# Patient Record
Sex: Female | Born: 1935 | Race: White | Hispanic: No | Smoking: Former smoker
Health system: Southern US, Community
[De-identification: ages and names within clinical notes are randomized; demographics above are authoritative.]

## PROBLEM LIST (undated history)

## (undated) DIAGNOSIS — I209 Angina pectoris, unspecified: Secondary | ICD-10-CM

## (undated) DIAGNOSIS — Z9289 Personal history of other medical treatment: Secondary | ICD-10-CM

## (undated) DIAGNOSIS — K219 Gastro-esophageal reflux disease without esophagitis: Secondary | ICD-10-CM

## (undated) DIAGNOSIS — R0789 Other chest pain: Secondary | ICD-10-CM

## (undated) DIAGNOSIS — J309 Allergic rhinitis, unspecified: Secondary | ICD-10-CM

## (undated) DIAGNOSIS — G4700 Insomnia, unspecified: Secondary | ICD-10-CM

## (undated) DIAGNOSIS — J45909 Unspecified asthma, uncomplicated: Secondary | ICD-10-CM

## (undated) DIAGNOSIS — IMO0001 Reserved for inherently not codable concepts without codable children: Secondary | ICD-10-CM

## (undated) DIAGNOSIS — R918 Other nonspecific abnormal finding of lung field: Secondary | ICD-10-CM

## (undated) DIAGNOSIS — I4891 Unspecified atrial fibrillation: Secondary | ICD-10-CM

## (undated) DIAGNOSIS — G4730 Sleep apnea, unspecified: Secondary | ICD-10-CM

## (undated) DIAGNOSIS — I10 Essential (primary) hypertension: Secondary | ICD-10-CM

## (undated) DIAGNOSIS — C189 Malignant neoplasm of colon, unspecified: Secondary | ICD-10-CM

## (undated) DIAGNOSIS — J9610 Chronic respiratory failure, unspecified whether with hypoxia or hypercapnia: Secondary | ICD-10-CM

## (undated) DIAGNOSIS — I480 Paroxysmal atrial fibrillation: Secondary | ICD-10-CM

## (undated) DIAGNOSIS — I2510 Atherosclerotic heart disease of native coronary artery without angina pectoris: Secondary | ICD-10-CM

## (undated) DIAGNOSIS — N189 Chronic kidney disease, unspecified: Secondary | ICD-10-CM

## (undated) DIAGNOSIS — N1832 Chronic kidney disease, stage 3b: Secondary | ICD-10-CM

## (undated) DIAGNOSIS — M069 Rheumatoid arthritis, unspecified: Secondary | ICD-10-CM

## (undated) DIAGNOSIS — R0602 Shortness of breath: Secondary | ICD-10-CM

## (undated) HISTORY — DX: Gastro-esophageal reflux disease without esophagitis: K21.9

## (undated) HISTORY — PX: BACK SURGERY: SHX140

## (undated) HISTORY — PX: COLON SURGERY: SHX602

## (undated) HISTORY — DX: Shortness of breath: R06.02

## (undated) HISTORY — DX: Reserved for inherently not codable concepts without codable children: IMO0001

## (undated) HISTORY — PX: ANKLE FRACTURE SURGERY: SHX122

## (undated) HISTORY — DX: Unspecified atrial fibrillation: I48.91

## (undated) HISTORY — PX: BREAST SURGERY: SHX581

## (undated) HISTORY — DX: Morbid (severe) obesity due to excess calories: E66.01

## (undated) HISTORY — DX: Chronic kidney disease, unspecified: N18.9

## (undated) HISTORY — DX: Personal history of other medical treatment: Z92.89

## (undated) HISTORY — PX: ABDOMINAL HYSTERECTOMY: SHX81

## (undated) HISTORY — DX: Other chest pain: R07.89

## (undated) HISTORY — DX: Malignant neoplasm of colon, unspecified: C18.9

## (undated) HISTORY — DX: Sleep apnea, unspecified: G47.30

## (undated) HISTORY — DX: Insomnia, unspecified: G47.00

## (undated) HISTORY — PX: CORONARY STENT PLACEMENT: SHX1402

## (undated) HISTORY — DX: Allergic rhinitis, unspecified: J30.9

---

## 2000-06-16 ENCOUNTER — Encounter: Payer: Self-pay | Admitting: Neurosurgery

## 2000-06-18 ENCOUNTER — Encounter: Payer: Self-pay | Admitting: Neurosurgery

## 2000-06-18 ENCOUNTER — Observation Stay (HOSPITAL_COMMUNITY): Admission: RE | Admit: 2000-06-18 | Discharge: 2000-06-19 | Payer: Self-pay | Admitting: Neurosurgery

## 2000-08-30 ENCOUNTER — Ambulatory Visit (HOSPITAL_COMMUNITY): Admission: RE | Admit: 2000-08-30 | Discharge: 2000-08-30 | Payer: Self-pay | Admitting: Neurosurgery

## 2000-08-30 ENCOUNTER — Encounter: Payer: Self-pay | Admitting: Neurosurgery

## 2000-11-07 ENCOUNTER — Encounter: Admission: RE | Admit: 2000-11-07 | Discharge: 2000-12-23 | Payer: Self-pay | Admitting: Family Medicine

## 2001-02-05 ENCOUNTER — Other Ambulatory Visit: Admission: RE | Admit: 2001-02-05 | Discharge: 2001-02-05 | Payer: Self-pay | Admitting: Gynecology

## 2001-02-24 ENCOUNTER — Encounter: Admission: RE | Admit: 2001-02-24 | Discharge: 2001-02-24 | Payer: Self-pay | Admitting: Family Medicine

## 2001-02-24 ENCOUNTER — Encounter: Payer: Self-pay | Admitting: Family Medicine

## 2001-03-27 ENCOUNTER — Encounter: Payer: Self-pay | Admitting: Neurosurgery

## 2001-03-27 ENCOUNTER — Encounter: Admission: RE | Admit: 2001-03-27 | Discharge: 2001-03-27 | Payer: Self-pay | Admitting: Neurosurgery

## 2001-04-10 ENCOUNTER — Encounter: Admission: RE | Admit: 2001-04-10 | Discharge: 2001-04-10 | Payer: Self-pay | Admitting: Neurosurgery

## 2001-04-10 ENCOUNTER — Encounter: Payer: Self-pay | Admitting: Neurosurgery

## 2001-06-15 ENCOUNTER — Encounter: Payer: Self-pay | Admitting: Family Medicine

## 2001-06-15 ENCOUNTER — Encounter: Admission: RE | Admit: 2001-06-15 | Discharge: 2001-06-15 | Payer: Self-pay | Admitting: Family Medicine

## 2001-08-26 ENCOUNTER — Encounter: Payer: Self-pay | Admitting: Neurosurgery

## 2001-08-26 ENCOUNTER — Encounter: Admission: RE | Admit: 2001-08-26 | Discharge: 2001-08-26 | Payer: Self-pay | Admitting: Neurosurgery

## 2001-10-13 ENCOUNTER — Encounter: Payer: Self-pay | Admitting: Gynecology

## 2001-10-13 ENCOUNTER — Ambulatory Visit (HOSPITAL_COMMUNITY): Admission: RE | Admit: 2001-10-13 | Discharge: 2001-10-13 | Payer: Self-pay | Admitting: Gynecology

## 2001-11-13 ENCOUNTER — Encounter: Payer: Self-pay | Admitting: Gynecology

## 2001-11-16 ENCOUNTER — Observation Stay (HOSPITAL_COMMUNITY): Admission: RE | Admit: 2001-11-16 | Discharge: 2001-11-17 | Payer: Self-pay | Admitting: Gynecology

## 2001-11-16 ENCOUNTER — Encounter (INDEPENDENT_AMBULATORY_CARE_PROVIDER_SITE_OTHER): Payer: Self-pay

## 2002-04-19 ENCOUNTER — Encounter: Admission: RE | Admit: 2002-04-19 | Discharge: 2002-04-19 | Payer: Self-pay | Admitting: Urology

## 2002-04-19 ENCOUNTER — Encounter: Payer: Self-pay | Admitting: Urology

## 2002-10-06 ENCOUNTER — Encounter: Payer: Self-pay | Admitting: Family Medicine

## 2002-10-06 ENCOUNTER — Encounter: Admission: RE | Admit: 2002-10-06 | Discharge: 2002-10-06 | Payer: Self-pay | Admitting: Family Medicine

## 2005-01-28 ENCOUNTER — Encounter: Admission: RE | Admit: 2005-01-28 | Discharge: 2005-01-28 | Payer: Self-pay | Admitting: Family Medicine

## 2006-01-14 ENCOUNTER — Other Ambulatory Visit: Admission: RE | Admit: 2006-01-14 | Discharge: 2006-01-14 | Payer: Self-pay | Admitting: Obstetrics and Gynecology

## 2006-01-27 ENCOUNTER — Encounter: Admission: RE | Admit: 2006-01-27 | Discharge: 2006-01-27 | Payer: Self-pay | Admitting: Obstetrics and Gynecology

## 2006-04-14 ENCOUNTER — Encounter: Admission: RE | Admit: 2006-04-14 | Discharge: 2006-04-14 | Payer: Self-pay | Admitting: Surgery

## 2006-04-16 ENCOUNTER — Encounter (INDEPENDENT_AMBULATORY_CARE_PROVIDER_SITE_OTHER): Payer: Self-pay | Admitting: Specialist

## 2006-04-16 ENCOUNTER — Ambulatory Visit (HOSPITAL_BASED_OUTPATIENT_CLINIC_OR_DEPARTMENT_OTHER): Admission: RE | Admit: 2006-04-16 | Discharge: 2006-04-16 | Payer: Self-pay | Admitting: Surgery

## 2006-08-19 ENCOUNTER — Encounter (INDEPENDENT_AMBULATORY_CARE_PROVIDER_SITE_OTHER): Payer: Self-pay | Admitting: *Deleted

## 2006-08-19 ENCOUNTER — Ambulatory Visit (HOSPITAL_COMMUNITY): Admission: RE | Admit: 2006-08-19 | Discharge: 2006-08-19 | Payer: Self-pay | Admitting: Surgery

## 2006-12-19 ENCOUNTER — Ambulatory Visit (HOSPITAL_COMMUNITY): Admission: RE | Admit: 2006-12-19 | Discharge: 2006-12-19 | Payer: Self-pay | Admitting: Surgery

## 2006-12-19 ENCOUNTER — Encounter (INDEPENDENT_AMBULATORY_CARE_PROVIDER_SITE_OTHER): Payer: Self-pay | Admitting: Specialist

## 2007-02-17 ENCOUNTER — Encounter: Admission: RE | Admit: 2007-02-17 | Discharge: 2007-02-17 | Payer: Self-pay | Admitting: Gynecology

## 2007-02-24 ENCOUNTER — Encounter: Admission: RE | Admit: 2007-02-24 | Discharge: 2007-02-24 | Payer: Self-pay | Admitting: Gynecology

## 2007-09-17 ENCOUNTER — Encounter: Admission: RE | Admit: 2007-09-17 | Discharge: 2007-09-17 | Payer: Self-pay | Admitting: Family Medicine

## 2008-06-28 ENCOUNTER — Encounter: Admission: RE | Admit: 2008-06-28 | Discharge: 2008-06-28 | Payer: Self-pay | Admitting: Surgery

## 2009-03-17 ENCOUNTER — Encounter: Admission: RE | Admit: 2009-03-17 | Discharge: 2009-03-17 | Payer: Self-pay | Admitting: Family Medicine

## 2009-06-29 ENCOUNTER — Encounter: Admission: RE | Admit: 2009-06-29 | Discharge: 2009-06-29 | Payer: Self-pay | Admitting: Family Medicine

## 2010-02-26 ENCOUNTER — Encounter: Payer: Self-pay | Admitting: Emergency Medicine

## 2010-03-06 ENCOUNTER — Encounter: Payer: Self-pay | Admitting: Emergency Medicine

## 2010-03-06 ENCOUNTER — Ambulatory Visit (HOSPITAL_COMMUNITY): Admission: RE | Admit: 2010-03-06 | Discharge: 2010-03-06 | Payer: Self-pay | Admitting: Family Medicine

## 2010-03-16 ENCOUNTER — Encounter: Payer: Self-pay | Admitting: Emergency Medicine

## 2010-04-18 DIAGNOSIS — I10 Essential (primary) hypertension: Secondary | ICD-10-CM | POA: Insufficient documentation

## 2010-04-18 DIAGNOSIS — C189 Malignant neoplasm of colon, unspecified: Secondary | ICD-10-CM | POA: Insufficient documentation

## 2010-04-18 DIAGNOSIS — G4700 Insomnia, unspecified: Secondary | ICD-10-CM | POA: Insufficient documentation

## 2010-04-18 DIAGNOSIS — K219 Gastro-esophageal reflux disease without esophagitis: Secondary | ICD-10-CM | POA: Insufficient documentation

## 2010-04-18 DIAGNOSIS — M797 Fibromyalgia: Secondary | ICD-10-CM | POA: Insufficient documentation

## 2010-04-19 ENCOUNTER — Ambulatory Visit: Payer: Self-pay | Admitting: Emergency Medicine

## 2010-04-19 DIAGNOSIS — J309 Allergic rhinitis, unspecified: Secondary | ICD-10-CM | POA: Insufficient documentation

## 2010-04-19 DIAGNOSIS — I4891 Unspecified atrial fibrillation: Secondary | ICD-10-CM | POA: Insufficient documentation

## 2010-04-19 DIAGNOSIS — R0789 Other chest pain: Secondary | ICD-10-CM | POA: Insufficient documentation

## 2010-04-19 DIAGNOSIS — R0602 Shortness of breath: Secondary | ICD-10-CM | POA: Insufficient documentation

## 2010-04-26 ENCOUNTER — Telehealth: Payer: Self-pay | Admitting: Emergency Medicine

## 2010-05-10 ENCOUNTER — Ambulatory Visit: Payer: Self-pay | Admitting: Emergency Medicine

## 2010-05-22 ENCOUNTER — Telehealth (INDEPENDENT_AMBULATORY_CARE_PROVIDER_SITE_OTHER): Payer: Self-pay | Admitting: *Deleted

## 2010-07-24 ENCOUNTER — Encounter: Admission: RE | Admit: 2010-07-24 | Discharge: 2010-07-24 | Payer: Self-pay | Admitting: Family Medicine

## 2010-11-29 NOTE — Letter (Signed)
Summary: Lupe Carney MD/[facility] MD/[facility]   Imported By: Lester [place] 06/08/2010 09:05:01  _____________________________________________________________________  External Attachment:    Type:   Image     Comment:   External Document

## 2010-11-29 NOTE — Progress Notes (Signed)
Summary: rx request/ cough-pt called again  Phone Note Call from Patient Call back at Home Phone [phone]   Caller: Patient Call For: byrum Summary of Call: pt wants a rx to "knock out her cough". non-productive. pt was recently seen and says that the ultram hasn't helped with this. [facility] [address] Initial call taken by: Tivis Ringer, CNA,  May 22, 2010 10:31 AM  Follow-up for Phone Call        called and spoke with pt---she stated that she has been like this since april--dry cough most of the time sometimes with white sputum---she stated that the ultram has not helped very much---please advise.  thanks Randell Loop CMA  May 22, 2010 11:24 AM   Additional Follow-up for Phone Call Additional follow up Details #1::        pt called yesterday. still waiting to hear back. (dr byrum was out of office and is out all week) please call her asap. she is requesting a zpac or "something". her sides are very sore from coughing now. Tivis Ringer, CNA  May 23, 2010 3:38 PM    Additional Follow-up for Phone Call Additional follow up Details #2::    she has a chronic cough that has been ongoing.  She needs ov for evaluation Follow-up by: Barbaraann Share MD,  May 23, 2010 5:28 PM  Additional Follow-up for Phone Call Additional follow up Details #3:: Details for Additional Follow-up Action Taken: Spoke with pt.  and advised needs ov.  She refused this, stating that she was "just here"- I advised that we can not tx her cough over the phone and should come in if this is bothering her.  She again refused and states that she will call her family doctor and see if they will call her in abx.  Additional Follow-up by: Vernie Murders,  May 23, 2010 5:32 PM

## 2010-11-29 NOTE — Assessment & Plan Note (Signed)
Summary: cough, dyspnea   Visit Type:  Initial Consult Copy to:  Dr. Clovis Riley Primary Provider/Referring Provider:  Dr. Lupe Carney  CC:  Pulmonary consult.The patient c/o increased sob with exertion and says she cannot take a deep breath..  History of Present Illness: 77 yo former smoker, hx of HTN, fibromyalgia, GERD, arthritis, rectal CA '07. Also with some allergic rhinitis. Referred for evaluation of fatigue and shortness.   Summer 2010 she began to notice some exertional dyspnea, overall fatigue. Associated with sneezing, runny nose, cough. Cough is non-prod, has a globus sensation with throat clearing. She has about 12 lbs wt gain in last yr. Complains of some mid-sternal chest pain, ? GERD. Was changed from ACE-I to ARB yrs ago due to cough. Currently on Advair, no real response. Not taking fluticisone NS regularly, not on PPI. She tells me that she has had a cardiac stress test that was reassuring (not available to me yet)  Preventive Screening-Counseling & Management  Alcohol-Tobacco     Alcohol drinks/day: 0     Smoking Status: quit     Smoking Cessation Counseling: yes     Smoke Cessation Stage: quit     Year Started: 1949     Year Quit: 1964     Pack years: 1 ppd x13 years     Tobacco Counseling: to remain off tobacco products  Current Medications (verified): 1)  Diovan Hct 160-25 Mg Tabs (Valsartan-Hydrochlorothiazide) .Marland Kitchen.. 1 By Mouth Daily 2)  Fluticasone Propionate 50 Mcg/act Susp (Fluticasone Propionate) .Marland Kitchen.. 1 Spray in Each Nostril Daily 3)  Proventil Hfa 108 (90 Base) Mcg/act Aers (Albuterol Sulfate) .... 2 Puffs Every 4 Hours As Needed For Sob and Wheezing 4)  Lunesta 3 Mg Tabs (Eszopiclone) .Marland Kitchen.. 1 By Mouth At Bedtime  As Needed 5)  Vitamin C 500 Mg Tabs (Ascorbic Acid) .Marland Kitchen.. 1 By Mouth Daily 6)  Calcium 500 +d 500-400 Mg-Unit Tabs (Calcium-Vitamin D) .Marland Kitchen.. 1 By Mouth Daily 7)  B Complex  Tabs (B Complex Vitamins) .Marland Kitchen.. 1 By Mouth Daily 8)  Zinc 50 Mg Tabs (Zinc)  .Marland Kitchen.. 1 By Mouth Daily As Needed 9)  Advair Diskus 250-50 Mcg/dose Aepb (Fluticasone-Salmeterol) .Marland Kitchen.. 1 Puff Two Times A Day  Allergies: 1)  ! * Asa 2)  ! * Ace Inhibitors  Past History:  Past Surgical History: Last updated: 04/18/2010 Hemorrhoidectomy TVH w/BSO in 2003 Rectal cancer in 2007 Removal of rectal scar in 2008  Family History: Last updated: 04/19/2010 Heart disease---mother and 2 brothers Family History Lung Cancer---sister, deceased  Social History: Last updated: 04/19/2010 Widowed Patient states former smoker.  Retired Neurosurgeon  Past Medical History: Current Problems:  Hx of ATRIAL FIBRILLATION (ICD-427.31) INSOMNIA (ICD-780.52) FIBROMYALGIA (ICD-729.1) CANCER, COLORECTAL (ICD-153.9) HYPERTENSION (ICD-401.9) G E R D (ICD-530.81)  Family History: Reviewed history and no changes required. Heart disease---mother and 2 brothers Family History Lung Cancer---sister, deceased  Social History: Reviewed history and no changes required. Widowed Patient states former smoker.  Retired Financial planner Status:  quit Pack years:  1 ppd x13 years Alcohol drinks/day:  0  Review of Systems       The patient complains of shortness of breath with activity, shortness of breath at rest, productive cough, chest pain, irregular heartbeats, acid heartburn, indigestion, weight change, abdominal pain, headaches, sneezing, itching, depression, hand/feet swelling, and joint stiffness or pain.  The patient denies non-productive cough, coughing up blood, loss of appetite, difficulty swallowing, sore throat, tooth/dental problems, nasal congestion/difficulty breathing through nose, ear ache, anxiety, rash, change in  color of mucus, and fever.    Vital Signs:  Patient profile:   77 year old female Height:      64 inches (162.56 cm) Weight:      195 pounds (88.64 kg) BMI:     33.59 O2 Sat:      96 % on Room air Temp:     97.8 degrees F (36.56 degrees C) oral Pulse rate:    67 / minute BP sitting:   112 / 70  (right arm) Cuff size:   regular  Vitals Entered By: Michel Bickers CMA (April 19, 2010 2:02 PM)  O2 Sat at Rest %:  96 O2 Flow:  Room air CC: Pulmonary consult.The patient c/o increased sob with exertion and says she cannot take a deep breath. Comments Ambulatory Pulse Oximetry  Resting; HR_83____    02 Sat__945RA___  Lap1 (185 feet)   HR_87____   02 Sat_96%RA____ Lap2 (185 feet)   HR_87____   02 Sat__96%RA___    Lap3 (185 feet)   HR__82___   02 Sat__97%RA___  _x__Test Completed without Difficulty ___Test Stopped due to:     Physical Exam  General:  normal appearance, healthy appearing, and obese.   Head:  normocephalic and atraumatic Eyes:   conjunctiva and sclera clear Nose:  no deformity, discharge, inflammation, or lesions Mouth:  no deformity or lesions Neck:  no masses, thyromegaly, or abnormal cervical nodes Lungs:  clear bilaterally to auscultation and percussion Heart:  regular rate and rhythm, S1, S2 without murmurs, rubs, gallops, or clicks Abdomen:  not examined Msk:  no deformity or scoliosis noted with normal posture Extremities:  no clubbing, cyanosis, edema, or deformity noted Neurologic:  non-focal Skin:  intact without lesions or rashes Psych:  alert and cooperative; normal mood and affect; normal attention span and concentration   Impression & Recommendations:  Problem # 1:  DYSPNEA (ICD-786.05)  Suspect multifactorial - wt gain + some underlying mild airflow limitation that has been exacerbated by worsening allergies.  - trial changing advair to spiriva - SABA as needed - treat allergies - rov to discuss 2 -3 weeks  - walking oximetry today  Orders: Consultation Level IV (04540)  Problem # 2:  CHEST PAIN, ATYPICAL (ICD-786.59) Sounds like GERD, she reportedly has a reassuring stress test at [facility].  - will obtain stress test results - empiric omeprazole trial until next visit  Medications Added to  Medication List This Visit: 1)  Zinc 50 Mg Tabs (Zinc) .Marland Kitchen.. 1 by mouth daily as needed 2)  Advair Diskus 250-50 Mcg/dose Aepb (Fluticasone-salmeterol) .Marland Kitchen.. 1 puff two times a day 3)  Omeprazole 20 Mg Cpdr (Omeprazole) .Marland Kitchen.. 1 by mouth once daily 4)  Spiriva Handihaler 18 Mcg Caps (Tiotropium bromide monohydrate) .Marland Kitchen.. 1 inhalation once daily 5)  Loratadine 10 Mg Tabs (Loratadine) .Marland Kitchen.. 1 by mouth once daily  Patient Instructions: 1)  Stop your Advair 2)  Start Spiriva 1 inhalation once daily  3)  Use proventil as needed for shortness of breath 4)  Start loratadine 10mg  by mouth once daily  5)  Start taking your fluticasone nasal spray, 2 sprays each nostril two times a day  6)  Start omeprazole 20mg  by mouth once daily until your next visit 7)  Follow up with Dr Delton Coombes in 2 -3 weeks to discuss your progress.  Prescriptions: LORATADINE 10 MG TABS (LORATADINE) 1 by mouth once daily  #30 x 3   Entered and Authorized by:   Leslye Peer MD   Signed  by:   Leslye Peer MD on 04/19/2010   Method used:   Electronically to        [facility]. [address] [phone]* (retail)       [address]       [place], [place]  [address]       Ph: [phone] or [phone]       Fax: [phone]   RxID:   [id number] SPIRIVA HANDIHALER 18 MCG CAPS (TIOTROPIUM BROMIDE MONOHYDRATE) 1 inhalation once daily  #30 x 3   Entered and Authorized by:   Leslye Peer MD   Signed by:   Leslye Peer MD on 04/19/2010   Method used:   Electronically to        [facility] [address] [phone]* (retail)       [address]       [place], [place]  [address]       Ph: [phone] or [phone]       Fax: [phone]   RxID:   [id number] OMEPRAZOLE 20 MG CPDR (OMEPRAZOLE) 1 by mouth once daily  #30 x 3   Entered and Authorized by:   Leslye Peer MD   Signed by:   Leslye Peer MD on 04/19/2010   Method used:   Electronically to        [facility] [address] [phone]*  (retail)       [address]       [place], [place]  [address]       Ph: [phone] or [phone]       Fax: [phone]   RxID:   [id number]

## 2010-11-29 NOTE — Assessment & Plan Note (Signed)
Summary: cough, GERD, allergies   Visit Type:  Follow-up Copy to:  Dr. Clovis Riley Primary Provider/Referring Provider:  Dr. Lupe Carney  CC:  patient is here for follow-up. The patient says her breathing has improved. she does c/o a dry cough. Worse at night..  History of Present Illness: 77 yo former smoker, hx of HTN, fibromyalgia, GERD, arthritis, rectal CA '07. Also with some allergic rhinitis. Referred for evaluation of fatigue and shortness.   Summer 2010 she began to notice some exertional dyspnea, overall fatigue. Associated with sneezing, runny nose, cough. Cough is non-prod, has a globus sensation with throat clearing. She has about 12 lbs wt gain in last yr. Complains of some mid-sternal chest pain, ? GERD. Was changed from ACE-I to ARB yrs ago due to cough. Currently on Advair, no real response. Not taking fluticisone NS regularly, not on PPI. She tells me that she has had a cardiac stress test that was reassuring (not available to me yet)  ROV 05/10/10 -- returns for f/u allergies, cough, COPD. Was treated with loratadine, nasal steroid. Never started the nose spray. Also treated GERD with omeprazole. Called here with persistant cough 2 weeks ago and received ultram that seemed to help. Cough was better for a while, then she had another episode prompted her to get azithromycin from PCP. We also changed Advair to Spiriva, which she believes has helped her breathing significantly.   Current Medications (verified): 1)  Diovan Hct 160-25 Mg Tabs (Valsartan-Hydrochlorothiazide) .Marland Kitchen.. 1 By Mouth Daily 2)  Fluticasone Propionate 50 Mcg/act Susp (Fluticasone Propionate) .Marland Kitchen.. 1 Spray in Each Nostril Daily 3)  Proventil Hfa 108 (90 Base) Mcg/act Aers (Albuterol Sulfate) .... 2 Puffs Every 4 Hours As Needed For Sob and Wheezing 4)  Lunesta 3 Mg Tabs (Eszopiclone) .Marland Kitchen.. 1 By Mouth At Bedtime  As Needed 5)  Vitamin C 500 Mg Tabs (Ascorbic Acid) .Marland Kitchen.. 1 By Mouth Daily 6)  Calcium 500 +d 500-400  Mg-Unit Tabs (Calcium-Vitamin D) .Marland Kitchen.. 1 By Mouth Daily 7)  B Complex  Tabs (B Complex Vitamins) .Marland Kitchen.. 1 By Mouth Daily 8)  Zinc 50 Mg Tabs (Zinc) .Marland Kitchen.. 1 By Mouth Daily As Needed 9)  Omeprazole 20 Mg Cpdr (Omeprazole) .Marland Kitchen.. 1 By Mouth Once Daily 10)  Spiriva Handihaler 18 Mcg Caps (Tiotropium Bromide Monohydrate) .Marland Kitchen.. 1 Inhalation Once Daily 11)  Loratadine 10 Mg Tabs (Loratadine) .Marland Kitchen.. 1 By Mouth Once Daily 12)  Tramadol Hcl 50 Mg Tabs (Tramadol Hcl) .Marland Kitchen.. 1 By Mouth Two Times A Day As Needed For Cough  Allergies (verified): 1)  ! * Asa 2)  ! * Ace Inhibitors  Vital Signs:  Patient profile:   77 year old female Height:      64 inches (162.56 cm) Weight:      206 pounds (93.64 kg) BMI:     35.49 O2 Sat:      97 % on Room air Temp:     98.0 degrees F (36.67 degrees C) oral Pulse rate:   71 / minute BP sitting:   142 / 60  (left arm) Cuff size:   large  Vitals Entered By: Michel Bickers CMA (May 10, 2010 3:17 PM)  O2 Sat at Rest %:  97 O2 Flow:  Room air CC: patient is here for follow-up. The patient says her breathing has improved. she does c/o a dry cough. Worse at night. Comments Medications reivewed. Daytime phone verified. Michel Bickers [facility]  May 10, 2010 3:22 PM   Physical Exam  General:  normal appearance, healthy appearing, and obese.   Head:  normocephalic and atraumatic Eyes:   conjunctiva and sclera clear Nose:  no deformity, discharge, inflammation, or lesions Mouth:  no deformity or lesions Neck:  no masses, thyromegaly, or abnormal cervical nodes Lungs:  clear bilaterally to auscultation and percussion Heart:  regular rate and rhythm, S1, S2 without murmurs, rubs, gallops, or clicks Abdomen:  not examined Msk:  no deformity or scoliosis noted with normal posture Extremities:  no clubbing, cyanosis, edema, or deformity noted Neurologic:  non-focal Skin:  intact without lesions or rashes Psych:  alert and cooperative; normal mood and affect; normal attention span and  concentration   Impression & Recommendations:  Problem # 1:  DYSPNEA (ICD-786.05)  Spiriva once daily   Orders: Est. Patient Level IV (27253) Continue your omeprazole 20mg  once daily  Continue your loratadine once daily  Start nasal saline washes once daily  Use ultram as needed for cough Follow up with Dr Delton Coombes in 2 months or as needed.   Patient Instructions: 1)  Continue your omeprazole 20mg  once daily  2)  Continue your loratadine once daily  3)  Start nasal saline washes once daily  4)  Continue your Spiriva 1 inhalation once daily  5)  Use ultram as needed for cough 6)  Follow up with Dr Delton Coombes in 2 months or as needed.

## 2010-11-29 NOTE — Progress Notes (Signed)
Summary: Cough  Phone Note Call from Patient Call back at Home Phone [phone]   Caller: Patient Call For: byrum Reason for Call: Talk to Nurse Summary of Call: pt has a bad cough during the night, can she get some cough meds.  She doesn't want to suffer over w/e.  Getting some white plegm up too. [facility] Initial call taken by: Eugene Gavia,  April 26, 2010 9:17 AM  Follow-up for Phone Call        The patient c/o a dry hacky cough with little mucus prod. Mucus is white in color. She is taking Spiriva, Loratadine, fluticasone and omeptazole as RX'd by Dr. Delton Coombes at last OV. She says she has coughed all night. Will send Msg to doc of the day for any recs. Please advise.:  1)  ! * Asa 2)  ! * Ace Inhibitors Follow-up by: Michel Bickers CMA,  April 26, 2010 9:36 AM  Additional Follow-up for Phone Call Additional follow up Details #1::        Per CDY,  call Tramadol 50mg , # 20, 1 by mouth two times a day as needed for cough and try Delsym OTC. LMOM with details for patient. Will forward to RB so he is aware. Additional Follow-up by: Michel Bickers CMA,  April 26, 2010 1:49 PM    New/Updated Medications: TRAMADOL HCL 50 MG TABS (TRAMADOL HCL) 1 by mouth two times a day as needed for cough Prescriptions: TRAMADOL HCL 50 MG TABS (TRAMADOL HCL) 1 by mouth two times a day as needed for cough  #20 x 0   Entered by:   Michel Bickers CMA   Authorized by:   Waymon Budge MD   Signed by:   Michel Bickers CMA on 04/26/2010   Method used:   Electronically to        [facility] [address] [phone]* (retail)       [address]       [place], [place]  [address]       Ph: [phone] or [phone]       Fax: [phone]   RxID:   [id number]

## 2011-03-15 NOTE — Op Note (Signed)
NAME:  Sheryl Curtis                 ACCOUNT NO.:  [id number]   MEDICAL RECORD NO.:  [id number]          PATIENT TYPE:  AMB   LOCATION:  DAY                          FACILITY:  [facility]   PHYSICIAN:  Sandria Bales. Ezzard Standing, M.D.  DATE OF BIRTH:  November 11, 1931   DATE OF PROCEDURE:  12/19/2006  DATE OF DISCHARGE:                               OPERATIVE REPORT   PREOPERATIVE DIAGNOSIS:  History of T1 rectal carcinoma.   POSTOPERATIVE DIAGNOSIS:  Residual scar at the 6-cm area of the anal  verge posteriorly with no evidence of recurrent tumor.   PROCEDURES:  1. Flexible sigmoidoscopy to 60 cm.  2. Rectal ultrasound.  3. Transanal excision of rectal scar.   SURGEON:  Ovidio Kin, MD   FIRST ASSISTANT:  None.   ANESTHESIA:  General endotracheal.   ESTIMATED BLOOD LOSS:  25 mL.   DRAINS:  Drains left in were none.   PRIOR TO PROCEDURE:  Ms. Sheryl Curtis is a 77 year old white female who was  found to have a rectal tumor by Dr. Vilinda Boehringer.  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  There was no lymphovascular  invasion and the margins appeared negative though close.   I felt the patient would be best served by having a repeat exam under  anesthesia approximately four months later with plan to re-excise the  scar to make sure there is no residual cancer left behind.   The patient completed a GoLYTELY bowel prep at home.  The indications  and potential complications were explained to the patient.  Potential  complications include but are not limited to bleeding, infection,  recurrence of the tumor.   OPERATIVE NOTE:  The patient placed in a lithotomy position.  Again, she  had taken a half-load of the bowel prep the night before presenting.  She was given 1 gm of cefoxitin at initiation of the procedure.   At this time I used a flexible  sigmoidoscope, which I advanced up to  about 65 cm without difficultly.  Her sigmoid colon and rectum were  unremarkable.  I took pictures of her scar from her prior excision.  The  scar lies almost directly posterior, approximately 6 cm within the  rectum and this appeared benign.   I then did a rectal ultrasound.  There was disruption of the normal  anatomy at 5 to 6 cm from the anal verge.  The ultrasound findings were  consistent with her prior rectal sugery.  I could not see any evidence  of any residual tumor.   Therefore, I used the long anoscope.  I cleaned her out with Betadine  and I excised a button of scar tissue.  The defect that I left was a  little over 2 cm; however, what I excised was probably 1 x 1.5 cm.  I  had gotten it down into the scar deep.   I closed this with running and interrupted  2-0 chromic suture and I  closed this a couple of times with a couple of sutures.   The patient tolerated the procedure well.  I then irrigated with saline.  I used Gelfoam over Nupercainal ointment in the rectal vault and then  dressed her rectum.  The sponge and needle count were correct.  She will  be discharged home today.  She will see me back in about two weeks for a  followup, pathology report, and examination.      Sandria Bales. Ezzard Standing, M.D.  Electronically Signed     DHN/MEDQ  D:  12/19/2006  T:  12/20/2006  Job:  [id number]   cc:   Fayrene Fearing L. Malon Kindle., M.D.  Elsworth Soho, M.D.  Luvenia Redden, M.D.

## 2011-03-15 NOTE — Op Note (Signed)
NAME:  Sheryl Curtis, Sheryl Curtis                 ACCOUNT NO.:  [id number]   MEDICAL RECORD NO.:  [id number]          PATIENT TYPE:  AMB   LOCATION:  DAY                          [facility]ACILITY[facility]  [facility]   PHYSICIAN:  Sandria Bales. Ezzard Standing, M.D.  DATE OF BIRTH:  10-13-1932   DATE OF PROCEDURE:  08/19/2006  DATE OF DISCHARGE:                                 OPERATIVE REPORT   PREOPERATIVE DIAGNOSIS:  Posterior rectal mass.   POSTOPERATIVE DIAGNOSIS:  Approximately a 1.8 centimeter tumor of the  rectum, approximately 5-6 centimeters from the anal verge with ultrasonic  characteristics of possibly an invasive cancer (final pathology pending).   OPERATION PERFORMED:  1. Rigid sigmoidoscopy to 15 centimeters.  2. Rectal ultrasound.  3. Transanal excision of rectal tumor.   SURGEON:  Sandria Bales. Ezzard Standing, M.D.   FIRST ASSISTANT:  Ardeth Sportsman, M.D.   ANESTHESIA:  General endotracheal.   ESTIMATED BLOOD LOSS:  The estimated blood loss was 75 mL.   DRAINS:  Drains left in were none.   INDICATIONS FOR THE SURGERY:  Sheryl Curtis was found by Dr. Carman Ching to  have a rectal tumor, which on biopsy showed a villous adenoma with  dysplasia.  There were no frank malignancies seen.  The remainder of her  colonoscopy was unremarkable.  She now comes in for evaluation of this  rectal mass with a rectal ultrasound and attempt at transanal excision of  this mass.   The indications, potential complications and the procedure were explained to  the patient.  Potential complications include, but are not limited to  bleeding and infection.   DESCRIPTION OF THE OPERATION:  The patient was in the lithotomy position.  She had prepped herself the night before with half LYTELY.  She was given a  gram of cefoxitin at the initiation of the procedure.   I first sigmoidoscoped her to 15 cm.  She did have some loose stool in the  rectum, which I evacuated with irrigation.  I then used a rectal ultrasound  and advanced this up into  the rectum.  At about 5-6 cm from the anal verge I  found a mass, which was approximately 1.8 cm in maximum width.  It appeared  to go to the depth of 0.8 cm.  Again, this was a raised nodule and was  probably compressed into the rectal wall.  There did appear to be a  disruption in the submucosa, but no expansion into the muscularis propria.  This will be categorized by ultrasound as a uT1 lesion consistent with  probable local invasive carcinoma.  However, this is not a pathologic  finding, but only an ultrasound finding with final pathology pending at the  time of this dictation.  Ultrasound records are in the chart.   I then turned my attention to transanal resection of the above.  I placed a  2-0 chromic  suture approximately 1 cm above the mass.  I then grabbed the  mass and excised the mass in toto.  Again, it measured probably in width of  about 1.8 cm.  I marked  a long suture for the cranial portion and a shorter  suture for the left side of the tumor, which would be the patient's right  side and sent this to pathology.  I then closed this resection with running  chromic suture.  I put in some buttress sutures in the middle.  I dressed  the area with Gelfoam covered with Nupercainal ointment into the anal canal  at the end of the operation.   The patient tolerated the procedure well.  Because she did well she will be  discharged home tonight with the final pathology pending.  She will see me  in one week for follow up.      Sandria Bales. Ezzard Standing, M.D.  Electronically Signed     DHN/MEDQ  D:  08/19/2006  T:  08/20/2006  Job:  [id number]   cc:   Luvenia Redden, M.D.  Fax: [phone]   [name], M.D.  Fax: [phone]   Llana Aliment. Malon Kindle., M.D.  Fax: [phone]

## 2011-03-15 NOTE — Op Note (Signed)
NAME:  Sheryl Curtis, Sheryl Curtis                 ACCOUNT NO.:  [id number]   MEDICAL RECORD NO.:  [id number]          PATIENT TYPE:  AMB   LOCATION:  DSC                          FACILITY:  [facility]   PHYSICIAN:  Currie Paris, M.D.DATE OF BIRTH:  Dec 13, 1931   DATE OF PROCEDURE:  04/16/2006  DATE OF DISCHARGE:                                 OPERATIVE REPORT   Office Medical Record # CCS [phone].   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   OPERATION:  Left ductal excision.   SURGEON:  Currie Paris, M.D.   ANESTHESIA:  General.   CLINICAL HISTORY:  [name] is a 74-year lady who developed a spontaneous  left nipple discharge.  A ductogram had shown an abrupt cut off, but  subsequent to that the discharge stopped and we were unclear as to exactly  which duct was causing the discharge.  We waited several weeks and  reexamined her at which point the discharge could, again, be elicited; and  she was then scheduled for surgery.  The duct appeared to be at  approximately the 7 to 8 o'clock position in the left nipple.   DESCRIPTION OF PROCEDURE:  The patient was seen in the holding area; and she  had no further questions; and confirmed that the planned procedure was to  remove the ductal system from the left breast.  We both marked the left  breast as the operative side.  The patient was then taken to the operating  room.  After satisfactory general anesthesia had been obtained, the left  breast was prepped and draped.  The time-out occurred.   I initially attempted to cannulate the duct from which the discharge could  be elicited with a 24 Angiocath, but was unable to get this to thread.  I  then took the smallest tear duct probe; and readily got that to go in; and  it tracked deep and a little bit medial.  With that in the duct I made a  curvilinear  incision at the areolar margin, and elevated the subareolar skin  until I could identify the duct.  I entered the small cavity that contained  about a 1 mL of cloudy fluid and I thought this was probably the area of the  papilloma; and the tear duct probe appeared to go directly through this.  I  was able to completely disconnect the duct; and then leaving the probe in  the duct take a cylinder of tissue around the probe until I got well beyond  the tip of the probe; and then this completed the excision.  Just prior to  irrigating, I and noted that there was a small fragment of what appeared to  be papillomatous tissue; and I made sure that we got that out; and I thought  this probably came off from the duct as we transected it.   Once everything was dry and we had irrigated, I put some 0.25% Marcaine in  to help with the  postop analgesia.  The breast was closed in layers with 3-0  Vicryl and 4-0 Monocryl subcuticular, plus Dermabond.  The patient tolerated  the procedure well.  There were no operative complications.  All counts were  correct.      Currie Paris, M.D.  Electronically Signed     CJS/MEDQ  D:  04/16/2006  T:  04/16/2006  Job:  [id number]   cc:   [name], M.D.  Fax: [phone]   [name], M.D.  Fax: [phone]

## 2011-03-15 NOTE — Op Note (Signed)
[place]  Patient:    SHERRE, WOOTON                        MRN: [id number] Proc. Date: 06/18/00 Adm. Date:  [id number] Attending:  Coletta Memos                           Operative Report  PREOPERATIVE DIAGNOSIS: 1. Displaced disk, left side, L4-5. 2. Left L5 foraminal stenosis.  POSTOPERATIVE DIAGNOSIS: 1. Displaced disk, left side, L4-5. 2. Left L5 foraminal stenosis.  OPERATION PERFORMED:  Microdissection with laminectomy and diskectomy L4-5 on the left and an L5 foraminotomy on the left.  COMPLICATIONS:  None.  SURGEON:  Kyle L. Franky Macho, M.D.  ASSISTANT:  Danae Orleans. Venetia Maxon, M.D.  ANESTHESIA:  General endotracheal.  INDICATIONS FOR PROCEDURE:  Yohanna Tow is a __ -year-old woman who presented with a 10-year history of pain in the left lower extremity.  The pain was going around the left hip and down the leg.  She also had some anterior numbness in the legs.  I recommended and she agreed to undergo a left lower extremity and diskectomy for what is a displaced disk at L4-5 and foraminal stenosis at L5.  DESCRIPTION OF PROCEDURE:  The patient was brought to the operating room.  She was intubated and placed under general anesthesia without difficulty.  Her back was prepped and she was draped in sterile fashion. I took a localizing x-ray prior to this showing the needle pointing to the spinous process of L4. Using that at a guide, I infiltrated my proposed incisions with 0.5% lidocaine 1:200,000 strength epinephrine, total of 10 cc.  After the patient was draped, I opened the incision with a #10 blade and took this down through the subcutaneous tissue fat to the thoracolumbar fascia.  I controlled bleeding with monopolar cautery and bipolar cautery in the subcutaneous tissues.  I placed a self-retaining retractor Weitlaner to hold the skin edges apart.  I palpated the spinous processes of what I believed to be L5 and L5 and I exposed the  lamina of these using monopolar cautery and Cobb periosteal elevator.  After that was done, I took another x-ray placing a double ended ganglion knife inferior to the superior lamina and my exposure which was L4. I then proceeded with the laminectomy using a Midas Rex drill on L4.  I did this until I was able to get underneath the ligamentum flavum rostrally using a nerve hook.  I then removed the ligamentum flavum in a rostral caudal direction using a Kerrison punch.  I brought the microscope into the surgical field and removed more ligament.  I exposed the thecal sac and the L5 nerve root.  Once I had done that, I retracted the thecal sac medially and exposed the herniated disk which was obviously bulging and putting pressure on the nerve root.  I coagulated epidural veins and divided them using a 15 blade.  I opened the disk space with a 15 blade and proceeded with a diskectomy at L4-5. I used Epstein curets and pituitary rongeurs in progressive fashion to remove disk both medially and laterally.  I did this until I felt that the nerve was well decompressed and that I had removed all free disk material from the disk space.  I then proceeded with my laminotomy overlying the L5 nerve root.  I did this until I was satisfied that  there was no compression on the nerve.  I did drill slight hump on the pedicle which appeared to be impinging on the nerve root in its course through the neural foramen.  Dr. Venetia Maxon was assisting during this period of time.  I irrigated the wound.  I inspected the L5 nerve root and the disk space again and I felt that there was very good decompression of the L5 nerve root throughout its intraspinal course and going out into the neural foramen.  I irrigated the wound.  I closed the wound in layered fashion using Vicryl sutures to reapproximate the thoracolumbar fascia.  I reapproximated subcutaneous tissues with Vicryl sutures, 2-0. I reapproximated the skin edges with  Steri-Strips.  A sterile dressing was applied.  The patient was then rolled supine and extubated moving all extremities well. DD:  06/18/00 TD:  06/19/00 Job: [id number]

## 2011-03-15 NOTE — Op Note (Signed)
[facility]  Patient:    Sheryl Curtis, Sheryl Curtis Visit Number: [id number] MRN: [id number]          Service Type: DSU Location: [id number] Attending Physician:  Susa Raring Dictated by:   Luvenia Redden, M.D. Proc. Date: 11/16/01 Admit Date:  11/16/2001 Discharge Date: 11/17/2001                             Operative Report  PREOPERATIVE DIAGNOSIS:       Left adnexal mass.  POSTOPERATIVE DIAGNOSIS:      Bilateral ovarian cysts, probably benign.  OPERATION:                    Laparoscopic bilateral salpingo-oophorectomy.  SURGEON:                      Luvenia Redden, M.D.  ASSISTANT:                    Janeece Riggers. Dareen Piano, M.D.  ANESTHESIA:  ESTIMATED BLOOD LOSS:  DESCRIPTION OF PROCEDURE:     Under good anesthesia, the patient was placed in the modified lithotomy position.  She was prepped and draped in the usual sterile fashion.  There was a Foley catheter in the bladder draining clear urine.  A spongestick was placed in the vagina to help identify the vaginal apex internally.  Transverse incision was made at the lower border of the umbilicus.  The Veress needle was inserted intraperitoneally.  Placement was confirmed by a negative pressure reading on the manometer with upward traction of the abdominal wall.  Pneumoperitoneum was performed using carbon dioxide. The needles were removed.  The laparoscopic trocar and cannula was introduced. The trocar removed and the operative laparoscope introduced.  Avascular areas in the right and left lower quadrants were identified.  Small incisions were made and 5 mm operative trocars and cannuli were placed in the right and left lower quadrants.  Trocars were removed and operative instruments were then introduced.  The intestines were mobilized out of the pelvis as much as possible so as to isolate the pelvic organs.  The uterus, of course, was absent because of previous hysterectomy.  On the right  side, there was a small cystic mass present in the right ovary.  The ovary itself was fairly freely movable in the pelvis.  There were a few adhesions down by the vaginal apex. The ureter was clearly visible and was well down into the pelvis.  On the left side, there was what looked like a multiloculated cystic mass in that left ovary and it was approximately 5 cm in diameter.  The ovary was freely movable in the pelvis.  There were no adhesions to the intestines or the peritoneum. Ureter was clearly visible on this side and was well below the infundibulopelvic ligament and well down on the side wall.  The left ovary was attacked first using the tripolar instrument, the ovary was grasped and tented up by Dr. Dareen Piano.  The infundibulopelvic ligament was well identified.  The ligament was grasped and cauterized to 0 and then incised.  This was done in several bites cauterizing an area that was several mm wide before incising the ligament.  Dissection was carried down through the mesentery, cauterizing, incising, and freeing up the mesentery and the ovary from the side wall.  The ovary was attached to the round ligament.  This area was cauterized and incised.  Dissection was carried down to near the vaginal apex and the ovary was completely freed up.  Stumps were inspected and there was no active bleeding. The ovary was placed down into the true pelvis and the right side was then approached.  Again, tenting up the ovary to demonstrate the infundibulopelvic ligament and then the plane of dissection was done.  Again the ligament was grasped and cauterized in several bites and then incised. The dissection was carried down to the peritoneum and then above the pelvic brim where the ovary was attached until its attachments had been all incised and the ovary was freed up.  The operative bed was again inspected.  There was no bleeding from the operative sites.  The vessels were inspected under  reduced pressure and there was no active bleeding.  These areas were irrigated with Ringers lactate and the solution was aspirated.  A fourth operative site was made in the midline suprapubically and a 10/12 trocar was inserted under direct vision.  Through this, an endocatch bag was introduced.  The right ovary was placed in the bag and then the bag pulled up to the peritoneal wall. A needle was used to enter the cyst and aspirate the fluid contents.  It was full of clear fluid.  Following that the specimen could be easily retrieved through the 10/12 trocar incision.  The 10/12 trocar was then reintroduced again through the midline incision.  The left tube and ovary was placed in the bag.  Under direct vision while the specimen was in the endocatch bag.  The fluid was aspirated from the cyst.  This was again a yellow straw-colored fluid and this deflated the ovary.  Following this then the bag was pulled up against the trocar and the trocar removed pulling the bag up to the peritoneal wall.  The 10/12 cannula was out of the abdominal incision and the bag could easily be retracted through the incision and the specimen was retrieved in the bag.  The operative sites were again viewed under 10 to 12 mm of pressure and again under reduced single digit pressure and there was no bleeding from the operative sites.  The area was irrigated with Ringers lactate and this was aspirated.  The cannuli were removed and the puncture sites were viewed under reduced pressure and there was no active bleeding.  The scope was withdrawn into the subumbilical cannula and then this was retracted slowly through the incision and there was no active bleeding in the incision while retracting the scope.  A single 0 Vicryl suture was used to close the fascia in the midline incision and following this, the skin incisions were all closed with subcuticular 3-0 plain catgut.  Steri-Strips and dry sterile dressings  were applied.  Estimated blood loss was estimated at 25 cc with none replaced. The patient tolerated the procedure nicely.  Foley catheter was removed in the operating room.  Dictated by:   Luvenia Redden, M.D. Attending Physician:  Susa Raring DD:  11/16/01 TD:  11/17/01 Job: [id number]

## 2011-03-20 ENCOUNTER — Other Ambulatory Visit: Payer: Self-pay | Admitting: Emergency Medicine

## 2011-03-26 ENCOUNTER — Other Ambulatory Visit: Payer: Self-pay | Admitting: *Deleted

## 2011-03-26 NOTE — Telephone Encounter (Signed)
Received incoming fax from [facility] [place] for Omeprazole 20 mg. Same is a duplicate and Lori refilled 03/21/2011. Needs OV!

## 2011-10-15 ENCOUNTER — Other Ambulatory Visit: Payer: Self-pay | Admitting: *Deleted

## 2011-10-15 NOTE — Progress Notes (Signed)
Patient needs Ov with Dr. Delton Coombes for any refills.

## 2013-03-23 ENCOUNTER — Other Ambulatory Visit (HOSPITAL_COMMUNITY): Payer: Self-pay | Admitting: Family Medicine

## 2013-03-23 DIAGNOSIS — J449 Chronic obstructive pulmonary disease, unspecified: Secondary | ICD-10-CM

## 2013-03-30 ENCOUNTER — Ambulatory Visit (HOSPITAL_COMMUNITY)
Admission: RE | Admit: 2013-03-30 | Discharge: 2013-03-30 | Disposition: A | Discharge status: Home / Self Care | Payer: Medicare Other | Source: Ambulatory Visit | Attending: Family Medicine | Admitting: Family Medicine

## 2013-03-30 DIAGNOSIS — J4489 Other specified chronic obstructive pulmonary disease: Secondary | ICD-10-CM | POA: Insufficient documentation

## 2013-03-30 DIAGNOSIS — J449 Chronic obstructive pulmonary disease, unspecified: Secondary | ICD-10-CM | POA: Insufficient documentation

## 2013-03-30 MED ORDER — ALBUTEROL SULFATE (5 MG/ML) 0.5% IN NEBU
2.5000 mg | INHALATION_SOLUTION | Freq: Once | RESPIRATORY_TRACT | Status: AC
  Administered 2013-03-30: 2.5 mg via RESPIRATORY_TRACT

## 2013-04-26 ENCOUNTER — Other Ambulatory Visit: Payer: Self-pay | Admitting: Interventional Cardiology

## 2013-04-28 ENCOUNTER — Encounter (HOSPITAL_BASED_OUTPATIENT_CLINIC_OR_DEPARTMENT_OTHER): Payer: Self-pay | Admitting: Interventional Cardiology

## 2013-04-28 ENCOUNTER — Inpatient Hospital Stay (HOSPITAL_BASED_OUTPATIENT_CLINIC_OR_DEPARTMENT_OTHER)
Admission: RE | Admit: 2013-04-28 | Discharge: 2013-04-28 | Disposition: A | Discharge status: Home / Self Care | Payer: Medicare Other | Source: Ambulatory Visit | Attending: Interventional Cardiology | Admitting: Interventional Cardiology

## 2013-04-28 ENCOUNTER — Encounter (HOSPITAL_BASED_OUTPATIENT_CLINIC_OR_DEPARTMENT_OTHER)
Admission: RE | Disposition: A | Discharge status: Home / Self Care | Payer: Self-pay | Source: Ambulatory Visit | Attending: Interventional Cardiology

## 2013-04-28 DIAGNOSIS — I10 Essential (primary) hypertension: Secondary | ICD-10-CM | POA: Insufficient documentation

## 2013-04-28 DIAGNOSIS — I2510 Atherosclerotic heart disease of native coronary artery without angina pectoris: Secondary | ICD-10-CM | POA: Insufficient documentation

## 2013-04-28 DIAGNOSIS — I209 Angina pectoris, unspecified: Secondary | ICD-10-CM | POA: Insufficient documentation

## 2013-04-28 HISTORY — DX: Angina pectoris, unspecified: I20.9

## 2013-04-28 HISTORY — DX: Essential (primary) hypertension: I10

## 2013-04-28 SURGERY — JV LEFT HEART CATHETERIZATION WITH CORONARY ANGIOGRAM

## 2013-04-28 MED ORDER — DIAZEPAM 5 MG PO TABS
5.0000 mg | ORAL_TABLET | ORAL | Status: AC
  Administered 2013-04-28: 5 mg via ORAL

## 2013-04-28 MED ORDER — SODIUM CHLORIDE 0.9 % IJ SOLN: 3.0000 mL | Freq: Two times a day (BID) | INTRAMUSCULAR | Status: DC

## 2013-04-28 MED ORDER — SODIUM CHLORIDE 0.9 % IV SOLN: INTRAVENOUS | Status: DC

## 2013-04-28 MED ORDER — ACETAMINOPHEN 325 MG PO TABS: 650.0000 mg | ORAL_TABLET | ORAL | Status: DC | PRN

## 2013-04-28 MED ORDER — SODIUM CHLORIDE 0.9 % IV SOLN: 250.0000 mL | INTRAVENOUS | Status: DC | PRN

## 2013-04-28 MED ORDER — SODIUM CHLORIDE 0.9 % IJ SOLN: 3.0000 mL | INTRAMUSCULAR | Status: DC | PRN

## 2013-04-28 MED ORDER — ASPIRIN 81 MG PO CHEW: 324.0000 mg | CHEWABLE_TABLET | ORAL | Status: DC

## 2013-04-28 MED ORDER — SODIUM CHLORIDE 0.9 % IV SOLN: 1.0000 mL/kg/h | INTRAVENOUS | Status: AC

## 2013-04-28 MED ORDER — ONDANSETRON HCL 4 MG/2ML IJ SOLN: 4.0000 mg | Freq: Four times a day (QID) | INTRAMUSCULAR | Status: DC | PRN

## 2013-04-28 NOTE — OR Nursing (Signed)
Tegaderm dressing applied, site level 0, bedrest began at 1045.

## 2013-04-28 NOTE — OR Nursing (Signed)
Meal served 

## 2013-04-28 NOTE — OR Nursing (Signed)
Discharge instructions reviewed and signed, pt stated understanding, ambulated in hall without difficulty, site level 0, transported to daughter's car via wheelchair 

## 2013-04-28 NOTE — OR Nursing (Signed)
Dr Varanasi at bedside to discuss results and treatment plan with pt and family 

## 2013-04-28 NOTE — CV Procedure (Signed)
PROCEDURE:  Left heart catheterization with selective coronary angiography, left ventriculogram.  Abdominal aortogram.  INDICATIONS:  Angina  The risks, benefits, and details of the procedure were explained to the patient.  The patient verbalized understanding and wanted to proceed.  Informed written consent was obtained.  PROCEDURE TECHNIQUE:  After Xylocaine anesthesia a 94F sheath was placed in the right femoral artery with a single anterior needle wall stick.   Left coronary angiography was done using a [place] L4 guide catheter.  Right coronary angiography was done using a 3 [facility] guide catheter.  Left ventriculography was done using a pigtail catheter.  Abdominal aortogram was done using a pigtail catheter as well.   CONTRAST:  Total of 90 cc.  COMPLICATIONS:  None.    HEMODYNAMICS:  Aortic pressure was 159/56; LV pressure was 166/19 ; LVEDP 18.  There was no gradient between the left ventricle and aorta.    ANGIOGRAPHIC DATA:   The left main coronary artery is widely patent.  The left anterior descending artery is a medium-size vessel which reaches the apex.  The proximal vessel is widely patent.  There is a large first diagonal which is widely patent.  The second diagonal has mild ostial disease.  But is otherwise patent.  Just past the origin of the second diagonal, there is a focal, eccentric plaque.  In some views, it appears more severe.  The left circumflex artery is a medium-size vessel.  There is a medium-sized OM 1 which is widely patent.  The circumflex system is widely patent.  The right coronary artery is a large dominant vessel.  The vessel is widely patent.  The posterior descending artery is large and patent.  Posterior lateral artery is large and widely patent.  LEFT VENTRICULOGRAM:  Left ventricular angiogram was done in the 30 RAO projection and revealed normal left ventricular wall motion and systolic function with an estimated ejection fraction of 60 %.  LVEDP was 18  mmHg.  ABDOMINAL AORTOGRAM: Mild abdominal aortic atherosclerosis.  No abdominal aortic aneurysm.  Bilateral single renal arteries which appear widely patent.  IMPRESSIONS:  1. Normal left main coronary artery. 2. Moderate disease in the mid left anterior descending artery, with focal lesion up to 75% in some views.  Otherwise patent.   3. Normal left circumflex artery and its branches. 4. Normal right coronary artery. 5. Normal left ventricular systolic function.  LVEDP 18 mmHg.  Ejection fraction 60 %.  RECOMMENDATION:  Will start aggressive antianginal therapy.  If the patient has refractory symptoms, we'll plan to bring her back to the cath lab for further investigation and possible intervention.  Will add beta blocker, imdur and Plavix since she is allergic to aspirin.

## 2013-05-02 NOTE — H&P (Signed)
Date of Initial H&P: 04/23/13  History reviewed, patient examined, no change in status, stable for surgery.

## 2013-05-17 ENCOUNTER — Other Ambulatory Visit: Payer: Self-pay | Admitting: Interventional Cardiology

## 2013-05-17 ENCOUNTER — Encounter (HOSPITAL_COMMUNITY): Payer: Self-pay | Admitting: Pharmacy Technician

## 2013-05-19 ENCOUNTER — Encounter (HOSPITAL_COMMUNITY)
Admission: RE | Disposition: A | Discharge status: Home / Self Care | Payer: Self-pay | Source: Ambulatory Visit | Attending: Interventional Cardiology

## 2013-05-19 ENCOUNTER — Ambulatory Visit (HOSPITAL_COMMUNITY)
Admission: RE | Admit: 2013-05-19 | Discharge: 2013-05-20 | Disposition: A | Discharge status: Home / Self Care | Payer: Medicare Other | Source: Ambulatory Visit | Attending: Interventional Cardiology | Admitting: Interventional Cardiology

## 2013-05-19 ENCOUNTER — Encounter (HOSPITAL_COMMUNITY): Payer: Self-pay | Admitting: *Deleted

## 2013-05-19 DIAGNOSIS — I209 Angina pectoris, unspecified: Secondary | ICD-10-CM | POA: Insufficient documentation

## 2013-05-19 DIAGNOSIS — R0602 Shortness of breath: Secondary | ICD-10-CM

## 2013-05-19 DIAGNOSIS — I2510 Atherosclerotic heart disease of native coronary artery without angina pectoris: Secondary | ICD-10-CM | POA: Insufficient documentation

## 2013-05-19 DIAGNOSIS — I498 Other specified cardiac arrhythmias: Secondary | ICD-10-CM | POA: Insufficient documentation

## 2013-05-19 DIAGNOSIS — Z79899 Other long term (current) drug therapy: Secondary | ICD-10-CM | POA: Insufficient documentation

## 2013-05-19 DIAGNOSIS — Z955 Presence of coronary angioplasty implant and graft: Secondary | ICD-10-CM

## 2013-05-19 DIAGNOSIS — I10 Essential (primary) hypertension: Secondary | ICD-10-CM

## 2013-05-19 HISTORY — PX: PERCUTANEOUS CORONARY STENT INTERVENTION (PCI-S): SHX5485

## 2013-05-19 LAB — POCT ACTIVATED CLOTTING TIME: Activated Clotting Time: 447 seconds

## 2013-05-19 SURGERY — PERCUTANEOUS CORONARY STENT INTERVENTION (PCI-S)
Anesthesia: LOCAL

## 2013-05-19 MED ORDER — METOPROLOL TARTRATE 25 MG PO TABS
25.0000 mg | ORAL_TABLET | Freq: Two times a day (BID) | ORAL | Status: DC
  Administered 2013-05-19: 22:00:00 25 mg via ORAL
  Filled 2013-05-19 (×4): qty 1

## 2013-05-19 MED ORDER — LIDOCAINE HCL (PF) 1 % IJ SOLN
INTRAMUSCULAR | Status: AC
  Filled 2013-05-19: qty 30

## 2013-05-19 MED ORDER — TIOTROPIUM BROMIDE MONOHYDRATE 18 MCG IN CAPS
18.0000 ug | ORAL_CAPSULE | Freq: Every day | RESPIRATORY_TRACT | Status: DC
  Filled 2013-05-19 (×2): qty 5

## 2013-05-19 MED ORDER — ACETAMINOPHEN 325 MG PO TABS
650.0000 mg | ORAL_TABLET | ORAL | Status: DC | PRN
  Administered 2013-05-19: 21:00:00 650 mg via ORAL
  Filled 2013-05-19: qty 2

## 2013-05-19 MED ORDER — HYDRALAZINE HCL 20 MG/ML IJ SOLN
INTRAMUSCULAR | Status: AC
  Filled 2013-05-19: qty 1

## 2013-05-19 MED ORDER — ZOLPIDEM TARTRATE 5 MG PO TABS
5.0000 mg | ORAL_TABLET | Freq: Every evening | ORAL | Status: DC | PRN
  Administered 2013-05-19: 5 mg via ORAL
  Filled 2013-05-19: qty 1

## 2013-05-19 MED ORDER — SODIUM CHLORIDE 0.9 % IV SOLN: INTRAVENOUS | Status: AC

## 2013-05-19 MED ORDER — ADULT MULTIVITAMIN W/MINERALS CH
1.0000 | ORAL_TABLET | Freq: Every day | ORAL | Status: DC
  Administered 2013-05-19: 1 via ORAL
  Filled 2013-05-19 (×3): qty 1

## 2013-05-19 MED ORDER — MIDAZOLAM HCL 2 MG/2ML IJ SOLN
INTRAMUSCULAR | Status: AC
  Filled 2013-05-19: qty 2

## 2013-05-19 MED ORDER — SODIUM CHLORIDE 0.9 % IV SOLN: 250.0000 mL | INTRAVENOUS | Status: DC | PRN

## 2013-05-19 MED ORDER — BIVALIRUDIN 250 MG IV SOLR
INTRAVENOUS | Status: AC
  Filled 2013-05-19: qty 250

## 2013-05-19 MED ORDER — NITROGLYCERIN 0.2 MG/ML ON CALL CATH LAB
INTRAVENOUS | Status: AC
  Filled 2013-05-19: qty 1

## 2013-05-19 MED ORDER — CLOPIDOGREL BISULFATE 75 MG PO TABS: 75.0000 mg | ORAL_TABLET | Freq: Every day | ORAL | Status: DC

## 2013-05-19 MED ORDER — HYDROCHLOROTHIAZIDE 12.5 MG PO CAPS
12.5000 mg | ORAL_CAPSULE | Freq: Every day | ORAL | Status: DC
  Administered 2013-05-20: 11:00:00 12.5 mg via ORAL
  Filled 2013-05-19 (×2): qty 1

## 2013-05-19 MED ORDER — FENTANYL CITRATE 0.05 MG/ML IJ SOLN
INTRAMUSCULAR | Status: AC
  Filled 2013-05-19: qty 2

## 2013-05-19 MED ORDER — HEPARIN (PORCINE) IN NACL 2-0.9 UNIT/ML-% IJ SOLN
INTRAMUSCULAR | Status: AC
  Filled 2013-05-19: qty 1000

## 2013-05-19 MED ORDER — CLOPIDOGREL BISULFATE 75 MG PO TABS
75.0000 mg | ORAL_TABLET | Freq: Every day | ORAL | Status: DC
  Administered 2013-05-20: 09:00:00 75 mg via ORAL
  Filled 2013-05-19: qty 1

## 2013-05-19 MED ORDER — TEMAZEPAM 15 MG PO CAPS: 15.0000 mg | ORAL_CAPSULE | Freq: Every evening | ORAL | Status: DC | PRN

## 2013-05-19 MED ORDER — LOSARTAN POTASSIUM 50 MG PO TABS
100.0000 mg | ORAL_TABLET | Freq: Every day | ORAL | Status: DC
  Administered 2013-05-20: 11:00:00 100 mg via ORAL
  Filled 2013-05-19 (×3): qty 2

## 2013-05-19 MED ORDER — MAGNESIUM HYDROXIDE 400 MG/5ML PO SUSP
30.0000 mL | Freq: Every day | ORAL | Status: DC | PRN
  Administered 2013-05-19: 30 mL via ORAL
  Filled 2013-05-19: qty 30

## 2013-05-19 MED ORDER — SODIUM CHLORIDE 0.9 % IV SOLN
INTRAVENOUS | Status: DC
  Administered 2013-05-19: 09:00:00 via INTRAVENOUS

## 2013-05-19 MED ORDER — ONDANSETRON HCL 4 MG/2ML IJ SOLN: 4.0000 mg | Freq: Four times a day (QID) | INTRAMUSCULAR | Status: DC | PRN

## 2013-05-19 MED ORDER — LOSARTAN POTASSIUM-HCTZ 100-12.5 MG PO TABS: 1.0000 | ORAL_TABLET | Freq: Every day | ORAL | Status: DC

## 2013-05-19 MED ORDER — FAMOTIDINE 20 MG PO TABS
20.0000 mg | ORAL_TABLET | Freq: Every day | ORAL | Status: DC
  Administered 2013-05-19: 14:00:00 20 mg via ORAL
  Filled 2013-05-19 (×3): qty 1

## 2013-05-19 MED ORDER — DIAZEPAM 5 MG PO TABS
5.0000 mg | ORAL_TABLET | ORAL | Status: AC
  Administered 2013-05-19: 5 mg via ORAL
  Filled 2013-05-19: qty 1

## 2013-05-19 MED ORDER — AMLODIPINE BESYLATE 5 MG PO TABS
5.0000 mg | ORAL_TABLET | Freq: Every day | ORAL | Status: DC
  Administered 2013-05-19 – 2013-05-20 (×2): 5 mg via ORAL
  Filled 2013-05-19 (×2): qty 1

## 2013-05-19 MED ORDER — SODIUM CHLORIDE 0.9 % IJ SOLN: 3.0000 mL | Freq: Two times a day (BID) | INTRAMUSCULAR | Status: DC

## 2013-05-19 MED ORDER — SODIUM CHLORIDE 0.9 % IJ SOLN: 3.0000 mL | INTRAMUSCULAR | Status: DC | PRN

## 2013-05-19 NOTE — H&P (Signed)
  Date of Initial H&P: 04/23/13  History reviewed, patient examined, no change in status, stable for surgery.  Diagnostic cath with mid LAD lesion; stress test shows apical ischemia.Cath Lab Visit (complete for each Cath Lab visit)  Clinical Evaluation Leading to the Procedure:   ACS: no  Non-ACS:    Anginal Classification: CCS III  Anti-ischemic medical therapy: Maximal Therapy (2 or more classes of medications)  Non-Invasive Test Results: Intermediate-risk stress test findings: cardiac mortality 1-3%/year  Prior CABG: No previous CABG

## 2013-05-19 NOTE — CV Procedure (Signed)
PROCEDURE:  PCI LAD  INDICATIONS:  Class III angina, refractory to two medications, metoprolol and Imdur.  Evidence of apical ischemia by nuclear stress test.  The risks, benefits, and details of the procedure were explained to the patient.  The patient verbalized understanding and wanted to proceed.  Informed written consent was obtained.  PROCEDURE TECHNIQUE:  After Xylocaine anesthesia a 64F sheath was placed in the right femoral artery with a single anterior needle wall stick.   Left coronary angiography was done using a CLS 3.5 guide catheter.  The intervention was performed.  See below for details.  Angio-Seal was used for hemostasis.   CONTRAST:  Total of 70 cc.  COMPLICATIONS:  None.     ANGIOGRAPHIC DATA:     The left anterior descending artery has an 75% focal mid vessel lesion, just after the first diagonal.   PCI NARRATIVE:  CLS 3.5 Guide catheter was used.  Angiomax was used for anticoagulation.  An ACT was used to check that the Angiomax was therapeutic.  A pro-water wire was placed across the area of disease in the LAD.  A 2.5 x 8 Promus drug-eluting stent was used to direct stent the area in the mid LAD.  A 2.5 x 6 [place] Quantum balloon was used to post dilate the stent, inflated to 18 atmospheres.  There is no residual stenosis.  The patient was stable throughout.  TIMI-3 flow was maintained in the diagonal which was jailed.    IMPRESSIONS:  1. Successful PCI to the mid left anterior descending artery with a 2.5 x 8 Promus drug eluting stent, postdilated to 2.65 mm in diameter.  TIMI 3 flow was maintained in the diagonal branch.   RECOMMENDATION:  Continue plavix for a year.  She is allergic to aspirin. She'll be watched overnight.  If there no complications, he'll go home tomorrow.  Recommend cardiac rehabilitation.

## 2013-05-19 NOTE — Progress Notes (Signed)
Utilization Review Completed Chanda Laperle J. Zettie Gootee, RN, BSN, NCM [phone]  

## 2013-05-20 ENCOUNTER — Telehealth (INDEPENDENT_AMBULATORY_CARE_PROVIDER_SITE_OTHER): Payer: Self-pay | Admitting: *Deleted

## 2013-05-20 LAB — BASIC METABOLIC PANEL
BUN: 19 mg/dL (ref 6–23)
CO2: 24 mEq/L (ref 19–32)
Calcium: 9.4 mg/dL (ref 8.4–10.5)
Chloride: 98 mEq/L (ref 96–112)
Creatinine, Ser: 1 mg/dL (ref 0.50–1.10)
GFR calc Af Amer: 60 mL/min — ABNORMAL LOW (ref >90)
GFR calc non Af Amer: 52 mL/min — ABNORMAL LOW (ref >90)
Glucose, Bld: 96 mg/dL (ref 70–99)
Potassium: 4.5 mEq/L (ref 3.5–5.1)
Sodium: 131 mEq/L — ABNORMAL LOW (ref 135–145)

## 2013-05-20 LAB — CBC
HCT: 35.7 % — ABNORMAL LOW (ref 36.0–46.0)
Hemoglobin: 11.9 g/dL — ABNORMAL LOW (ref 12.0–15.0)
MCH: 31.4 pg (ref 26.0–34.0)
MCHC: 33.3 g/dL (ref 30.0–36.0)
MCV: 94.2 fL (ref 78.0–100.0)
Platelets: 195 10*3/uL (ref 150–400)
RBC: 3.79 MIL/uL — ABNORMAL LOW (ref 3.87–5.11)
RDW: 13.5 % (ref 11.5–15.5)
WBC: 8 10*3/uL (ref 4.0–10.5)

## 2013-05-20 MED ORDER — AMLODIPINE BESYLATE 5 MG PO TABS: 5.0000 mg | ORAL_TABLET | Freq: Every day | ORAL | Status: DC

## 2013-05-20 MED ORDER — ACTIVE PARTNERSHIP FOR HEALTH OF YOUR HEART BOOK
Freq: Once | Status: AC
  Administered 2013-05-20: 06:00:00
  Filled 2013-05-20: qty 1

## 2013-05-20 MED FILL — Sodium Chloride IV Soln 0.9%: INTRAVENOUS | Qty: 50 | Status: AC

## 2013-05-20 NOTE — Discharge Summary (Signed)
Patient ID: Sheryl Curtis MRN: [id number] DOB/AGE: [birth year] 77 y.o.  Admit date: 05/19/2013 Discharge date: 05/20/2013  Primary Discharge Diagnosis CAD Secondary Discharge Diagnosis: angina, abnormal stress test  Significant Diagnostic Studies: angiography: PCI to LAD with 2.5 x 8 Promus DES  Consults: None  Hospital Course: Patient had uncomplicated angioplasty as noted above.  Tolerated procedure well.  BP was high.  Amlodipine started.  Metoprolol stopped due to bradycardia.  Walked with rehab and did well.  WOuld benefit from exercise program to increase stamina.   Discharge Exam: Blood pressure 165/46, pulse 59, temperature 97.7 F (36.5 C), temperature source Oral, resp. rate 22, height 5\' 4"  (1.626 m), weight 91.2 kg (201 lb 1 oz), SpO2 98.00%.   [place]/AT RRR, S1S2 CTA bilaterally Soft NT 2+ right DP pulse  No hematoma in the right groin Labs:   Lab Results  Component Value Date   WBC 8.0 05/20/2013   HGB 11.9* 05/20/2013   HCT 35.7* 05/20/2013   MCV 94.2 05/20/2013   PLT 195 05/20/2013    Recent Labs Lab 05/20/13 0610  NA 131*  K 4.5  CL 98  CO2 24  BUN 19  CREATININE 1.00  CALCIUM 9.4  GLUCOSE 96   No results found for this basename: CKTOTAL, CKMB, CKMBINDEX, TROPONINI    No results found for this basename: CHOL   No results found for this basename: HDL   No results found for this basename: LDLCALC   No results found for this basename: TRIG   No results found for this basename: CHOLHDL   No results found for this basename: LDLDIRECT       EKG:NSR, PRWP  FOLLOW UP PLANS AND APPOINTMENTS    Medication List    STOP taking these medications       diclofenac 75 MG EC tablet  Commonly known as:  VOLTAREN     metoprolol tartrate 25 MG tablet  Commonly known as:  LOPRESSOR      TAKE these medications       amLODipine 5 MG tablet  Commonly known as:  NORVASC  Take 1 tablet (5 mg total) by mouth daily.     CALCIUM PO  Take 1 tablet by  mouth daily.     clopidogrel 75 MG tablet  Commonly known as:  PLAVIX  Take 75 mg by mouth daily.     losartan-hydrochlorothiazide 100-12.5 MG per tablet  Commonly known as:  HYZAAR  Take 1 tablet by mouth daily.     multivitamin with minerals Tabs  Take 1 tablet by mouth daily.     ranitidine 300 MG tablet  Commonly known as:  ZANTAC  Take 300 mg by mouth at bedtime.     temazepam 15 MG capsule  Commonly known as:  RESTORIL  Take 15 mg by mouth at bedtime as needed for sleep.     tiotropium 18 MCG inhalation capsule  Commonly known as:  SPIRIVA  Place 18 mcg into inhaler and inhale daily.           Follow-up Information   Follow up with Corky Crafts., MD. Schedule an appointment as soon as possible for a visit in 3 weeks.   Contact information:   [address] [place] [address] [phone]       BRING ALL MEDICATIONS WITH YOU TO FOLLOW UP APPOINTMENTS  Time spent with patient to include physician time: 20 minutes Signed: Hillery Bhalla S. 05/20/2013, 9:05 AM

## 2013-05-20 NOTE — Telephone Encounter (Signed)
Patient called to find out what medication in 2009 Dr. Ezzard Standing told her not to take.  Patient states she had surgery on a cancerous polyp at that time and Dr. Ezzard Standing told her to never take a certain type of medication again.  Patient states the medication was for use for constipation.  Explained to patient that our system does not have notes that far back but we would discuss with Dr. Ezzard Standing to see if he remembers.

## 2013-05-20 NOTE — Progress Notes (Signed)
CARDIAC REHAB PHASE I   PRE:  Rate/Rhythm: 59 SB    BP: sitting 165/46    SaO2:   MODE:  Ambulation: 500 ft   POST:  Rate/Rhythm: 84 SR    BP: sitting 184/56, 30 min later with manual cuff 144/50     SaO2:   Pt feels much better. Previously could only walk 40 ft without resting. Rest x1 in 500 ft due to SOB. Encouraged pt to slowly increase exercise tolerance. Very interested in [facility] in [place]. Will send referral. BP elevated. Better with more appropriate cuff.  [phone]   Elissa Lovett Easton CES, ACSM 05/20/2013 9:08 AM

## 2013-05-20 NOTE — Telephone Encounter (Signed)
Spoke to Dr. Ezzard Standing who states that he only restricts laxatives for the short term following this type of surgery.  Dr. Ezzard Standing does not know of any constipation medications that patient should avoid.  Dr. Ezzard Standing suggested Milk of Mag as been a good laxative.  Patient updated at this time and agreeable.

## 2013-06-17 ENCOUNTER — Encounter (HOSPITAL_COMMUNITY)
Admission: RE | Admit: 2013-06-17 | Discharge: 2013-06-17 | Disposition: A | Discharge status: Home / Self Care | Payer: Medicare Other | Source: Ambulatory Visit | Attending: Interventional Cardiology | Admitting: Interventional Cardiology

## 2013-06-17 DIAGNOSIS — I10 Essential (primary) hypertension: Secondary | ICD-10-CM | POA: Insufficient documentation

## 2013-06-17 DIAGNOSIS — Z5189 Encounter for other specified aftercare: Secondary | ICD-10-CM | POA: Insufficient documentation

## 2013-06-17 DIAGNOSIS — I2510 Atherosclerotic heart disease of native coronary artery without angina pectoris: Secondary | ICD-10-CM | POA: Insufficient documentation

## 2013-06-17 DIAGNOSIS — J449 Chronic obstructive pulmonary disease, unspecified: Secondary | ICD-10-CM | POA: Insufficient documentation

## 2013-06-17 DIAGNOSIS — J4489 Other specified chronic obstructive pulmonary disease: Secondary | ICD-10-CM | POA: Insufficient documentation

## 2013-06-17 NOTE — Progress Notes (Signed)
Cardiac Rehab Medication Review by a Pharmacist  Does the patient  feel that his/her medications are working for him/her?  yes  Has the patient been experiencing any side effects to the medications prescribed?  no  Does the patient measure his/her own blood pressure or blood glucose at home?  yes   Does the patient have any problems obtaining medications due to transportation or finances?   no  Understanding of regimen: fair Understanding of indications: good Potential of compliance: good    Pharmacist comments: Patient seems to be compliant with medications and states they make her feel better, but she is unable to recall the names of the medications until prompted with the name.  She understands the importance of the medications and why is is taking them.    Thank you, [name] 06/17/2013 8:14 AM

## 2013-06-21 ENCOUNTER — Encounter (HOSPITAL_COMMUNITY): Payer: Self-pay

## 2013-06-21 ENCOUNTER — Encounter (HOSPITAL_COMMUNITY)
Admission: RE | Admit: 2013-06-21 | Discharge: 2013-06-21 | Disposition: A | Discharge status: Home / Self Care | Payer: Medicare Other | Source: Ambulatory Visit | Attending: Interventional Cardiology | Admitting: Interventional Cardiology

## 2013-06-21 NOTE — Progress Notes (Signed)
Pt started cardiac rehab today.  Pt tolerated light exercise without difficulty.  aysmptomatic except hip pain and dyspnea on exertion while walking.  Symptoms improved when using Nustep.  O@ sat-97% ra.   VSS, telemetry-sinus rhythm.  Pt oriented to exercise equipment and routine.  Understanding verbalized.  Will continue to monitor the patient throughout  the program.

## 2013-06-23 ENCOUNTER — Encounter (HOSPITAL_COMMUNITY)
Admission: RE | Admit: 2013-06-23 | Discharge: 2013-06-23 | Disposition: A | Discharge status: Home / Self Care | Payer: Medicare Other | Source: Ambulatory Visit | Attending: Interventional Cardiology | Admitting: Interventional Cardiology

## 2013-06-23 NOTE — Progress Notes (Signed)
PSYCHOSOCIAL ASSESSMENT  Pt psychosocial assessment reveals no barriers to rehab participation.  Pt quality of life is slightly altered by her physical constraints which limits her ability to perform tasks as prior to her illness. Pt states dissatisfaction with her dyspnea which she has been battling since 2011.  Pt is also concerned with her overall health and continued fatigue.  Pt reluctantly willing to participate in cardiac rehab however admits she is looking forward to feeling better and working towards improving her health.  Pt exhibits positive coping skills and has supportive family.  Offered emotional support and reassurance.  Pt instructed in pursed lip breathing.  Able to demonstrate.  Will continue to monitor.

## 2013-06-25 ENCOUNTER — Encounter (HOSPITAL_COMMUNITY)
Admission: RE | Admit: 2013-06-25 | Discharge: 2013-06-25 | Disposition: A | Discharge status: Home / Self Care | Payer: Medicare Other | Source: Ambulatory Visit | Attending: Interventional Cardiology | Admitting: Interventional Cardiology

## 2013-06-28 ENCOUNTER — Encounter (HOSPITAL_COMMUNITY): Payer: Medicare Other

## 2013-06-28 DIAGNOSIS — J449 Chronic obstructive pulmonary disease, unspecified: Secondary | ICD-10-CM | POA: Insufficient documentation

## 2013-06-28 DIAGNOSIS — Z5189 Encounter for other specified aftercare: Secondary | ICD-10-CM | POA: Insufficient documentation

## 2013-06-28 DIAGNOSIS — I2510 Atherosclerotic heart disease of native coronary artery without angina pectoris: Secondary | ICD-10-CM | POA: Insufficient documentation

## 2013-06-28 DIAGNOSIS — J4489 Other specified chronic obstructive pulmonary disease: Secondary | ICD-10-CM | POA: Insufficient documentation

## 2013-06-28 DIAGNOSIS — I10 Essential (primary) hypertension: Secondary | ICD-10-CM | POA: Insufficient documentation

## 2013-06-30 ENCOUNTER — Encounter (HOSPITAL_COMMUNITY)
Admission: RE | Admit: 2013-06-30 | Discharge: 2013-06-30 | Disposition: A | Discharge status: Home / Self Care | Payer: Medicare Other | Source: Ambulatory Visit | Attending: Interventional Cardiology | Admitting: Interventional Cardiology

## 2013-07-02 ENCOUNTER — Encounter (HOSPITAL_COMMUNITY)
Admission: RE | Admit: 2013-07-02 | Discharge: 2013-07-02 | Disposition: A | Discharge status: Home / Self Care | Payer: Medicare Other | Source: Ambulatory Visit | Attending: Interventional Cardiology | Admitting: Interventional Cardiology

## 2013-07-05 ENCOUNTER — Telehealth (HOSPITAL_COMMUNITY): Payer: Self-pay | Admitting: Family Medicine

## 2013-07-05 ENCOUNTER — Encounter (HOSPITAL_COMMUNITY): Payer: Medicare Other

## 2013-07-07 ENCOUNTER — Encounter (HOSPITAL_COMMUNITY)
Admission: RE | Admit: 2013-07-07 | Discharge: 2013-07-07 | Disposition: A | Discharge status: Home / Self Care | Payer: Medicare Other | Source: Ambulatory Visit | Attending: Interventional Cardiology | Admitting: Interventional Cardiology

## 2013-07-07 NOTE — Progress Notes (Signed)
Reviewed home exercise with pt today.  Pt plans to walk at home for 10 mins, 3 times/day until she can build her endurance for a full 30 minutes on days outside of CR.  She will also add an extra day of hand weights outside of CR.  Reviewed THR, pulse, RPE, sign and symptoms, NTG use, and when to call 911 or MD.  Pt voiced understanding.  Alexia Freestone, MS, ACSM RCEP 07/07/2013 2:55 PM

## 2013-07-09 ENCOUNTER — Telehealth (HOSPITAL_COMMUNITY): Payer: Self-pay | Admitting: Cardiac Rehabilitation

## 2013-07-09 ENCOUNTER — Encounter (HOSPITAL_COMMUNITY): Payer: Medicare Other

## 2013-07-09 NOTE — Telephone Encounter (Signed)
Pc received from pt.  She will be absent from cardiac rehab today due to fibromyalgia pain. Pt states the pain is increasing daily.  Pt would like to take 1-2 weeks off from cardiac rehab. Pt states she feels worse after rehab and she also feels that she started cardiac rehab too soon. Pt denies chest pain or dyspnea.   Pt advised to contact her PCP to discuss worsening fibromyalgia symptoms.  Understanding verbalized

## 2013-07-12 ENCOUNTER — Encounter (HOSPITAL_COMMUNITY): Payer: Medicare Other

## 2013-07-14 ENCOUNTER — Encounter (HOSPITAL_COMMUNITY): Payer: Medicare Other

## 2013-07-16 ENCOUNTER — Encounter (HOSPITAL_COMMUNITY): Payer: Medicare Other

## 2013-07-19 ENCOUNTER — Encounter (HOSPITAL_COMMUNITY): Admission: RE | Admit: 2013-07-19 | Payer: Medicare Other | Source: Ambulatory Visit

## 2013-07-21 ENCOUNTER — Encounter (HOSPITAL_COMMUNITY): Payer: Medicare Other

## 2013-07-23 ENCOUNTER — Encounter (HOSPITAL_COMMUNITY): Payer: Medicare Other

## 2013-07-26 ENCOUNTER — Encounter (HOSPITAL_COMMUNITY): Payer: Medicare Other

## 2013-07-28 ENCOUNTER — Encounter (HOSPITAL_COMMUNITY): Payer: Medicare Other

## 2013-07-30 ENCOUNTER — Encounter (HOSPITAL_COMMUNITY): Payer: Medicare Other

## 2013-07-30 ENCOUNTER — Telehealth: Payer: Self-pay | Admitting: Interventional Cardiology

## 2013-07-30 MED ORDER — PANTOPRAZOLE SODIUM 40 MG PO TBEC: 40.0000 mg | DELAYED_RELEASE_TABLET | Freq: Every day | ORAL | Status: DC

## 2013-07-30 MED ORDER — ISOSORBIDE MONONITRATE ER 30 MG PO TB24: 30.0000 mg | ORAL_TABLET | Freq: Every day | ORAL | Status: DC

## 2013-07-30 MED ORDER — LOSARTAN POTASSIUM 100 MG PO TABS: 100.0000 mg | ORAL_TABLET | Freq: Every day | ORAL | Status: DC

## 2013-07-30 MED ORDER — NITROGLYCERIN 0.4 MG SL SUBL: 0.4000 mg | SUBLINGUAL_TABLET | SUBLINGUAL | Status: DC | PRN

## 2013-07-30 MED ORDER — DULOXETINE HCL 30 MG PO CPEP: 30.0000 mg | ORAL_CAPSULE | Freq: Every day | ORAL | Status: DC

## 2013-07-30 MED ORDER — ISOSORBIDE MONONITRATE 15 MG HALF TABLET: ORAL_TABLET | ORAL | Status: DC

## 2013-07-30 NOTE — Telephone Encounter (Signed)
To Dr. Eldridge Dace, please advise. Meds updated. PCP told pt to call here with BP readings but her cuff has broken. When BP checked at Dr. Quita Skye office it was 130's/70's, but at home BP cuff (before broken) was 14-180 systolic. Pt c/o of HA as well.

## 2013-07-30 NOTE — Telephone Encounter (Signed)
Spoke with patient regarding her blood pressure. States it has been running up at home in the 180's ans she has had a headache. Did take her blood pressure machine to Dr Mitchell's office yesterday and they checked her blood pressure. Patient states she was told blood pressure ok but can not remember the numbers. Patient blood pressure machine quit working in the office. Tried to review patients medications with her but asked if she could call back when she was at home, did not have them with her.

## 2013-07-30 NOTE — Telephone Encounter (Signed)
Decrease Imdur to 15 mg daily.  Continue to monitor BP.

## 2013-07-30 NOTE — Telephone Encounter (Signed)
New Problem  Bp running high since the stent was put in and BP med's were changed. Has experienced extreme headaches. Is there an alternative medication that can prevent headaches and reduce BP// please advise.

## 2013-07-30 NOTE — Telephone Encounter (Signed)
Pt notified and meds updated.  

## 2013-07-30 NOTE — Telephone Encounter (Signed)
Follow UP  Daughter returning the call giving the list of medications as requested  Amlodipine 5mg  (BP)  Clopidogrel 75mg  Losartan 100mg  Isosrbide 30mg   Pantoprovole 40mg  Duloxetine 30 mg

## 2013-08-02 ENCOUNTER — Encounter (HOSPITAL_COMMUNITY): Payer: Medicare Other

## 2013-08-03 ENCOUNTER — Telehealth (HOSPITAL_COMMUNITY): Payer: Self-pay | Admitting: Cardiac Rehabilitation

## 2013-08-03 NOTE — Telephone Encounter (Signed)
pc to pt to assess fibromyalgia pain.  Pt states she continues to have discomfort and is unable to attend cardiac rehab at this time.  Pt states she has returned her parking pass.  Pt states she has discussed the pain with her MD who is going to increase her medication.  Pt advised to contact cardiac rehab if she would like to resume services once her joint/fibromyalgia pain is resolved.

## 2013-08-04 ENCOUNTER — Encounter (HOSPITAL_COMMUNITY): Payer: Medicare Other

## 2013-08-06 ENCOUNTER — Encounter (HOSPITAL_COMMUNITY): Payer: Medicare Other

## 2013-08-09 ENCOUNTER — Encounter (HOSPITAL_COMMUNITY): Payer: Medicare Other

## 2013-08-11 ENCOUNTER — Encounter (HOSPITAL_COMMUNITY): Payer: Medicare Other

## 2013-08-13 ENCOUNTER — Encounter (HOSPITAL_COMMUNITY): Payer: Medicare Other

## 2013-08-16 ENCOUNTER — Encounter (HOSPITAL_COMMUNITY): Payer: Medicare Other

## 2013-08-18 ENCOUNTER — Encounter (HOSPITAL_COMMUNITY): Payer: Medicare Other

## 2013-08-20 ENCOUNTER — Encounter (HOSPITAL_COMMUNITY): Payer: Medicare Other

## 2013-08-23 ENCOUNTER — Encounter (HOSPITAL_COMMUNITY): Payer: Medicare Other

## 2013-08-25 ENCOUNTER — Encounter (HOSPITAL_COMMUNITY): Payer: Medicare Other

## 2013-08-27 ENCOUNTER — Encounter (HOSPITAL_COMMUNITY): Payer: Medicare Other

## 2013-08-30 ENCOUNTER — Encounter (HOSPITAL_COMMUNITY): Payer: Medicare Other

## 2013-09-01 ENCOUNTER — Encounter (HOSPITAL_COMMUNITY): Payer: Medicare Other

## 2013-09-03 ENCOUNTER — Encounter (HOSPITAL_COMMUNITY): Payer: Medicare Other

## 2013-09-06 ENCOUNTER — Encounter: Payer: Self-pay | Admitting: Interventional Cardiology

## 2013-09-06 ENCOUNTER — Encounter (HOSPITAL_COMMUNITY): Payer: Medicare Other

## 2013-09-08 ENCOUNTER — Encounter (HOSPITAL_COMMUNITY): Payer: Medicare Other

## 2013-09-10 ENCOUNTER — Encounter (HOSPITAL_COMMUNITY): Payer: Medicare Other

## 2013-09-13 ENCOUNTER — Encounter (HOSPITAL_COMMUNITY): Payer: Medicare Other

## 2013-09-15 ENCOUNTER — Encounter (HOSPITAL_COMMUNITY): Payer: Medicare Other

## 2013-09-17 ENCOUNTER — Encounter (HOSPITAL_COMMUNITY): Payer: Medicare Other

## 2013-09-20 ENCOUNTER — Encounter (HOSPITAL_COMMUNITY): Payer: Medicare Other

## 2013-09-22 ENCOUNTER — Encounter (HOSPITAL_COMMUNITY): Payer: Medicare Other

## 2013-09-24 ENCOUNTER — Encounter (HOSPITAL_COMMUNITY): Payer: Medicare Other

## 2013-10-11 ENCOUNTER — Encounter: Payer: Self-pay | Admitting: *Deleted

## 2013-10-11 ENCOUNTER — Encounter: Payer: Self-pay | Admitting: Interventional Cardiology

## 2013-10-12 ENCOUNTER — Ambulatory Visit (INDEPENDENT_AMBULATORY_CARE_PROVIDER_SITE_OTHER): Payer: Medicare Other | Admitting: Interventional Cardiology

## 2013-10-12 ENCOUNTER — Encounter: Payer: Self-pay | Admitting: Interventional Cardiology

## 2013-10-12 VITALS — BP 140/64 | HR 76 | Ht 62.0 in | Wt 192.0 lb

## 2013-10-12 DIAGNOSIS — I2510 Atherosclerotic heart disease of native coronary artery without angina pectoris: Secondary | ICD-10-CM

## 2013-10-12 DIAGNOSIS — R5381 Other malaise: Secondary | ICD-10-CM

## 2013-10-12 DIAGNOSIS — R5383 Other fatigue: Secondary | ICD-10-CM

## 2013-10-12 DIAGNOSIS — I10 Essential (primary) hypertension: Secondary | ICD-10-CM

## 2013-10-12 DIAGNOSIS — R0609 Other forms of dyspnea: Secondary | ICD-10-CM

## 2013-10-12 DIAGNOSIS — R0989 Other specified symptoms and signs involving the circulatory and respiratory systems: Secondary | ICD-10-CM

## 2013-10-12 NOTE — Patient Instructions (Signed)
Your physician has requested that you have an echocardiogram. Echocardiography is a painless test that uses sound waves to create images of your heart. It provides your doctor with information about the size and shape of your heart and how well your heart's chambers and valves are working. This procedure takes approximately one hour. There are no restrictions for this procedure.  Your physician recommends that you schedule a follow-up appointment in 3 months. January appointment should be cancelled.   Your physician has recommended you make the following change in your medication:   1. Stop Imdur 15 mg.

## 2013-10-12 NOTE — Progress Notes (Signed)
Patient ID: Sheryl Curtis, female   DOB: 1931/12/31, 79 y.o.   MRN: [id number]    [address] [place], [place]  [address] Phone: [phone] Fax:  [phone]  Date:  10/12/2013   ID:  Sheryl Curtis, DOB 25-Aug-1932, MRN [id number]  PCP:  Lupe Carney, MD      History of Present Illness: Sheryl Curtis is a 79 y.o. female who had an LAD stent placed a few weeks ago. She had been having angina. She did not improve with medical therapy. She had an apical defect on stress test. She is better post DES to the mid LAD.  Much less chest pain.  No exertional sx.  She still has some fatigue with minimal activities.  She describes fatigue with taking a shower, doing any work in the kitchen. She has not fallen or passed out. She denies any palpitations. She has not had chest pain like she had prior to her stent. She just feels weak, and nervous. She has occasional sweating as well.    Wt Readings from Last 3 Encounters:  10/12/13 192 lb (87.091 kg)  06/17/13 198 lb 10.2 oz (90.1 kg)  05/20/13 201 lb 1 oz (91.2 kg)     Past Medical History  Diagnosis Date  . Essential hypertension, benign   . Other and unspecified angina pectoris   . Atrial fibrillation   . ALLERGIC RHINITIS   . CANCER, COLORECTAL   . CHEST PAIN, ATYPICAL   . DYSPNEA   . FIBROMYALGIA   . G E R D   . INSOMNIA     Current Outpatient Prescriptions  Medication Sig Dispense Refill  . amLODipine (NORVASC) 5 MG tablet Take 1 tablet (5 mg total) by mouth daily.  90 tablet  3  . CALCIUM PO Take 1 tablet by mouth daily.      . clopidogrel (PLAVIX) 75 MG tablet Take 75 mg by mouth daily.      . DULoxetine (CYMBALTA) 30 MG capsule Take 1 capsule (30 mg total) by mouth daily.      . isosorbide mononitrate (IMDUR) 15 mg TB24 24 hr tablet Take 15mg  po daily.      Marland Kitchen losartan (COZAAR) 100 MG tablet Take 1 tablet (100 mg total) by mouth daily.  90 tablet  3  . Multiple Vitamin (MULTIVITAMIN WITH MINERALS) TABS Take 1  tablet by mouth daily.      . nitroGLYCERIN (NITROSTAT) 0.4 MG SL tablet Place 1 tablet (0.4 mg total) under the tongue every 5 (five) minutes as needed for chest pain.  90 tablet  3  . pantoprazole (PROTONIX) 40 MG tablet Take 1 tablet (40 mg total) by mouth daily.  30 tablet  11  . PRESCRIPTION MEDICATION Apply 1 drop to eye 2 (two) times daily.      . ranitidine (ZANTAC) 300 MG tablet Take 300 mg by mouth at bedtime.      . temazepam (RESTORIL) 15 MG capsule Take 15 mg by mouth at bedtime as needed for sleep.      Marland Kitchen tiotropium (SPIRIVA) 18 MCG inhalation capsule Place 18 mcg into inhaler and inhale daily.       No current facility-administered medications for this visit.    Allergies:    Allergies  Allergen Reactions  . Ace Inhibitors     REACTION: cough  . Aspirin     REACTION: hives    Social History:  The patient  reports that she quit smoking about  51 years ago. She started smoking about 64 years ago. She does not have any smokeless tobacco history on file.   Family History:  The patient's family history is not on file.   ROS:  Please see the history of present illness.  No nausea, vomiting.  No fevers, chills.  No focal weakness.  No dysuria. Fatigue, shortness of breath with exertion.  All other systems reviewed and negative.   PHYSICAL EXAM: VS:  BP 140/64  Pulse 76  Ht 5\' 2"  (1.575 m)  Wt 192 lb (87.091 kg)  BMI 35.11 kg/m2 Well nourished, well developed, in no acute distress HEENT: normal Neck: no JVD, no carotid bruits Cardiac:  normal S1, S2; RRR;  Lungs:  clear to auscultation bilaterally, no wheezing, rhonchi or rales Abd: soft, nontender, no hepatomegaly, obese Ext: no edema Skin: warm and dry Neuro:   no focal abnormalities noted  EKG:  NSR, no ST segment changes    ASSESSMENT AND PLAN:  Coronary atherosclerosis of native coronary artery  Stop Imdur Tablet Extended Release 24 Hour, 60 MG, half tablet, Orally, Once a day Continue Plavix Tablet, 75 MG,  1 tablet, Orally, Once a day, Notes: sending in a rx pt has been out Notes: Angina significantly better post PCI of LAD. Stop Imdur. Did not do cardiac rehab due to joint pains. She is interested. Allergic to aspirin.  I asked her to look for another type of exercise that would help her be active.    Fatigue/SHOB: Feels that even small activities cause her to get tired and shaky and SHOB.  No evidence of fluid overload by exam.  Anxiety is also an issue.  Check echo to eval LV function.  Anxiety/ insomnia:  She has issues with both.  i have asked her to speak with Dr. Clovis Riley to see if there is something she can take.  I think anxiety Sheryl Curtis is likely playing a part as well.   HTN: COntrolled.   Signed, Fredric Mare, MD, [facility] 10/12/2013 11:17 AM

## 2013-11-02 ENCOUNTER — Ambulatory Visit (HOSPITAL_COMMUNITY): Payer: Medicare Other | Attending: Interventional Cardiology | Admitting: Cardiology

## 2013-11-02 ENCOUNTER — Encounter: Payer: Self-pay | Admitting: Cardiology

## 2013-11-02 DIAGNOSIS — I10 Essential (primary) hypertension: Secondary | ICD-10-CM | POA: Insufficient documentation

## 2013-11-02 DIAGNOSIS — R0609 Other forms of dyspnea: Secondary | ICD-10-CM | POA: Insufficient documentation

## 2013-11-02 DIAGNOSIS — Z87891 Personal history of nicotine dependence: Secondary | ICD-10-CM | POA: Insufficient documentation

## 2013-11-02 DIAGNOSIS — R0989 Other specified symptoms and signs involving the circulatory and respiratory systems: Secondary | ICD-10-CM

## 2013-11-02 DIAGNOSIS — I2510 Atherosclerotic heart disease of native coronary artery without angina pectoris: Secondary | ICD-10-CM

## 2013-11-02 DIAGNOSIS — R5383 Other fatigue: Secondary | ICD-10-CM

## 2013-11-02 NOTE — Progress Notes (Signed)
Echo performed. 

## 2013-11-23 ENCOUNTER — Ambulatory Visit: Payer: Medicare Other | Admitting: Interventional Cardiology

## 2014-01-13 ENCOUNTER — Encounter: Payer: Self-pay | Admitting: Interventional Cardiology

## 2014-01-13 ENCOUNTER — Ambulatory Visit (INDEPENDENT_AMBULATORY_CARE_PROVIDER_SITE_OTHER): Payer: Medicare Other | Admitting: Interventional Cardiology

## 2014-01-13 VITALS — BP 140/50 | HR 66 | Ht 63.0 in | Wt 196.0 lb

## 2014-01-13 DIAGNOSIS — R5383 Other fatigue: Secondary | ICD-10-CM

## 2014-01-13 DIAGNOSIS — R5381 Other malaise: Secondary | ICD-10-CM

## 2014-01-13 DIAGNOSIS — I2510 Atherosclerotic heart disease of native coronary artery without angina pectoris: Secondary | ICD-10-CM

## 2014-01-13 DIAGNOSIS — Z79899 Other long term (current) drug therapy: Secondary | ICD-10-CM

## 2014-01-13 DIAGNOSIS — I10 Essential (primary) hypertension: Secondary | ICD-10-CM

## 2014-01-13 MED ORDER — AMLODIPINE BESYLATE 10 MG PO TABS: ORAL_TABLET | ORAL | Status: DC

## 2014-01-13 MED ORDER — HYDROCHLOROTHIAZIDE 25 MG PO TABS: 25.0000 mg | ORAL_TABLET | Freq: Every day | ORAL | Status: DC

## 2014-01-13 NOTE — Patient Instructions (Signed)
Your physician has recommended you make the following change in your medication:   1. Stop Voltaren. You can try OTC Acetaminophen (tylenol)  2. Start HCTZ 25 mg 1 tablet daily.   Your physician recommends that you return for lab work in: 1 weeks on 01/20/14.  Your physician wants you to follow-up in: 6 months with Dr. Irish Lack. You will receive a reminder letter in the mail two months in advance. If you don't receive a letter, please call our office to schedule the follow-up appointment.

## 2014-01-13 NOTE — Progress Notes (Signed)
Patient ID: Sheryl Curtis, female   DOB: 08/02/1932, 80 y.o.   MRN: [id number] Patient ID: Sheryl Curtis, female   DOB: Feb 04, 1932, 80 y.o.   MRN: [id number]    [place], [place]  [address] Phone: [phone] Fax:  [phone]  Date:  01/13/2014   ID:  Sheryl Curtis, DOB 10-17-1932, MRN [id number]  PCP:  Donnie Coffin, MD      History of Present Illness: SABRINNA Curtis is a 80 y.o. female who had an LAD stent placed in 2014. She had been having angina. She did not improve with medical therapy. She had an apical defect on stress test. She is better post DES to the mid LAD.  Much less chest pain.  No exertional sx.  Only one 2 second episode of chest pain.  She had an itchy rash under her arms.  She took or prednisone and had relief but sx are returning off of the prednisone. She has swelling as well.  She has more in the left than the right.  She was given a different NSAID for her arthritis.  She still has some fatigue with minimal activities.  She describes fatigue with taking a shower, doing any work in the kitchen. She has not fallen or passed out. She denies any palpitations. She has not had chest pain like she had prior to her stent. She just feels weak, and nervous. She has occasional sweating as well.    Wt Readings from Last 3 Encounters:  01/13/14 196 lb (88.905 kg)  10/12/13 192 lb (87.091 kg)  06/17/13 198 lb 10.2 oz (90.1 kg)     Past Medical History  Diagnosis Date  . Essential hypertension, benign   . Other and unspecified angina pectoris   . Atrial fibrillation   . ALLERGIC RHINITIS   . CANCER, COLORECTAL   . CHEST PAIN, ATYPICAL   . DYSPNEA   . FIBROMYALGIA   . G E R D   . INSOMNIA     Current Outpatient Prescriptions  Medication Sig Dispense Refill  . amLODipine (NORVASC) 5 MG tablet Take 1 tablet (5 mg total) by mouth daily.  90 tablet  3  . CALCIUM PO Take 1 tablet by mouth daily.      . clopidogrel (PLAVIX) 75 MG tablet Take 75  mg by mouth daily.      . DULoxetine (CYMBALTA) 30 MG capsule Take 1 capsule (30 mg total) by mouth daily.      Marland Kitchen losartan (COZAAR) 100 MG tablet Take 1 tablet (100 mg total) by mouth daily.  90 tablet  3  . Multiple Vitamin (MULTIVITAMIN WITH MINERALS) TABS Take 1 tablet by mouth daily.      . nitroGLYCERIN (NITROSTAT) 0.4 MG SL tablet Place 1 tablet (0.4 mg total) under the tongue every 5 (five) minutes as needed for chest pain.  90 tablet  3  . pantoprazole (PROTONIX) 40 MG tablet Take 1 tablet (40 mg total) by mouth daily.  30 tablet  11  . PRESCRIPTION MEDICATION Apply 1 drop to eye 2 (two) times daily.      . ranitidine (ZANTAC) 300 MG tablet Take 300 mg by mouth at bedtime.       No current facility-administered medications for this visit.    Allergies:    Allergies  Allergen Reactions  . Ace Inhibitors     REACTION: cough  . Aspirin     REACTION: hives  . Hydroxyquinolines   .  Pamelor [Nortriptyline Hcl]   . Plaquenil [Hydroxychloroquine Sulfate]   . Prednisone     itchy  . Procardia [Nifedipine]     Social History:  The patient  reports that she quit smoking about 52 years ago. She started smoking about 64 years ago. She does not have any smokeless tobacco history on file.   Family History:  The patient's family history includes CAD in her brother; Heart attack (age of onset: 77) in her brother.   ROS:  Please see the history of present illness.  No nausea, vomiting.  No fevers, chills.  No focal weakness.  No dysuria. Fatigue, shortness of breath with exertion.  All other systems reviewed and negative.   PHYSICAL EXAM: VS:  BP 140/50  Pulse 66  Ht 5\' 3"  (1.6 m)  Wt 196 lb (88.905 kg)  BMI 34.73 kg/m2 Well nourished, well developed, in no acute distress HEENT: normal Neck: no JVD, no carotid bruits Cardiac:  normal S1, S2; RRR;  Lungs:  clear to auscultation bilaterally, no wheezing, rhonchi or rales Abd: soft, nontender, no hepatomegaly, obese Ext: no  edema Skin: warm and dry Neuro:   no focal abnormalities noted  EKG:  NSR, no ST segment changes    ASSESSMENT AND PLAN:  Coronary atherosclerosis of native coronary artery  Stopped Imdur  Continue Plavix Tablet, 75 MG, 1 tablet, Orally, Once a day, Notes: sending in a rx pt has been out Notes: Angina significantly better post PCI of LAD. Stop Imdur. Did not do cardiac rehab due to joint pains. She is interested. Allergic to aspirin.  I asked her to look for another type of exercise that would help her be active.    Fatigue/SHOB: Feels that even small activities cause her to get tired and shaky and SHOB.  No evidence of fluid overload by exam.  Anxiety is also an issue.  Checked echo to eval LV function in Jan 2015 and LVEF was normal.  BNP < 50 in 6/14 and 8/14. Lab results reviewed.  Anxiety/ insomnia:  She has issues with both.  i have asked her to speak with Dr. Alroy Dust to see if there is something she can take.  I think anxiety /depression likely played a part as well in the past.  HTN: Borderline COntrolled.  At home, BP is running better, 150/50 range a few days ago.  Typically has readings in the 150 or above range.  Increased amlodipine to 10 mg daily previously. Add HCTZ 25 mg daily. Would like to see systolic blood pressure less than 150. Some of her fatigue may be from elevated blood pressure readings.  D/C Voltaren due to interaction with plavix.  Increased risk of GI bleed.Can try OTC acetaminophen.    Signed, Mina Marble, MD, [facility] 01/13/2014 2:45 PM

## 2014-01-17 DIAGNOSIS — R5381 Other malaise: Secondary | ICD-10-CM | POA: Insufficient documentation

## 2014-01-17 DIAGNOSIS — R5383 Other fatigue: Secondary | ICD-10-CM

## 2014-01-20 ENCOUNTER — Ambulatory Visit (INDEPENDENT_AMBULATORY_CARE_PROVIDER_SITE_OTHER): Payer: Medicare Other | Admitting: *Deleted

## 2014-01-20 ENCOUNTER — Telehealth: Payer: Self-pay | Admitting: Interventional Cardiology

## 2014-01-20 DIAGNOSIS — Z79899 Other long term (current) drug therapy: Secondary | ICD-10-CM

## 2014-01-20 DIAGNOSIS — I10 Essential (primary) hypertension: Secondary | ICD-10-CM

## 2014-01-20 DIAGNOSIS — I4891 Unspecified atrial fibrillation: Secondary | ICD-10-CM

## 2014-01-20 DIAGNOSIS — I2510 Atherosclerotic heart disease of native coronary artery without angina pectoris: Secondary | ICD-10-CM

## 2014-01-20 LAB — BASIC METABOLIC PANEL
BUN: 16 mg/dL (ref 6–23)
CO2: 26 mEq/L (ref 19–32)
Calcium: 9.2 mg/dL (ref 8.4–10.5)
Chloride: 97 mEq/L (ref 96–112)
Creatinine, Ser: 1.3 mg/dL — ABNORMAL HIGH (ref 0.4–1.2)
GFR: 40.45 mL/min — ABNORMAL LOW (ref >60.00)
Glucose, Bld: 101 mg/dL — ABNORMAL HIGH (ref 70–99)
Potassium: 3.9 mEq/L (ref 3.5–5.1)
Sodium: 133 mEq/L — ABNORMAL LOW (ref 135–145)

## 2014-01-20 NOTE — Telephone Encounter (Signed)
New Message:  PT states she just had labs drawn at Dr. Virgilio Belling office at [place]... PT wants to know if she still needs to come in for lab work. She wants to cancel her lab appt for today and if she still needs to come in for blood work then she would like a call back.

## 2014-01-20 NOTE — Telephone Encounter (Signed)
Pt is aware she does need bmp today as she was started on HCTZ a week ago by Dr. Irish Lack. Her [facility] lab work, according to pt, was about a month ago  Pt understands & will come in for lab work today Horton Chin RN

## 2014-02-02 ENCOUNTER — Telehealth: Payer: Self-pay | Admitting: Cardiology

## 2014-02-02 DIAGNOSIS — Z79899 Other long term (current) drug therapy: Secondary | ICD-10-CM

## 2014-02-02 MED ORDER — CHLORTHALIDONE 25 MG PO TABS: 25.0000 mg | ORAL_TABLET | Freq: Every day | ORAL | Status: DC

## 2014-02-02 NOTE — Telephone Encounter (Signed)
Message copied by Ulla Potash H on Wed Feb 02, 2014  8:24 AM ------      Message from: Jettie Booze      Created: Tue Feb 01, 2014  2:02 PM       After she finishes current supply of HCTZ, have her switch to Chlorthalidone 25 mg daily, disp 30 RF 11.  THis may give a bigger BP lower effect.  Check BMet 1 week after the switch. ------

## 2014-02-02 NOTE — Telephone Encounter (Signed)
Pt notified. Meds updated and labs ordered.  

## 2014-02-07 ENCOUNTER — Telehealth: Payer: Self-pay | Admitting: Interventional Cardiology

## 2014-02-07 DIAGNOSIS — Z79899 Other long term (current) drug therapy: Secondary | ICD-10-CM

## 2014-02-07 NOTE — Telephone Encounter (Signed)
New Prob   Pt has some questions regarding her blood pressure. Please call.

## 2014-02-07 NOTE — Telephone Encounter (Signed)
Spoke with pt and she is still taking HCTZ. Pts BP today was 136/59. Pt thinks BP has been elevated because of arthritis pain. She had to go off her arthritis medication because of a reaction. She is trying another arthritis medication and it seems to be helping. Pt will continue to watch her BP for another week to call and let me know how BP is doing before switching medication.

## 2014-02-08 NOTE — Telephone Encounter (Signed)
If no change in meds, no BMet needed.

## 2014-02-08 NOTE — Telephone Encounter (Signed)
Noted, I will f/u with pt next week to see how BP is doing.

## 2014-02-08 NOTE — Telephone Encounter (Signed)
Pt never started Chlorthalidone per your last telephone note. She has stayed on the HCTZ because she feels that her BP is doing better since she is taking something for her arthritis pain. Is this okay? Do we still need Bmet in a week?

## 2014-02-08 NOTE — Telephone Encounter (Signed)
OK to start HCTZ 25 mg daily, BMet in one week.

## 2014-02-10 ENCOUNTER — Other Ambulatory Visit: Payer: Medicare Other

## 2014-02-17 NOTE — Telephone Encounter (Signed)
lmtrc

## 2014-02-17 NOTE — Telephone Encounter (Signed)
Spoke with pt and she still is checking her BP every once and a while. Pt states BP runs about 150's/60's. Pt is still having the arthritis pain. Pt wants to know if she really need to switch to another medication?

## 2014-02-20 NOTE — Telephone Encounter (Signed)
Would lik eto see systolic below 567 to reduce risk of MI and stroke.  Would try the new medicine to see if it gets better control.

## 2014-02-21 NOTE — Telephone Encounter (Signed)
lmtrc

## 2014-02-22 NOTE — Telephone Encounter (Signed)
lmtrc

## 2014-02-23 ENCOUNTER — Ambulatory Visit (INDEPENDENT_AMBULATORY_CARE_PROVIDER_SITE_OTHER): Payer: Medicare Other | Admitting: *Deleted

## 2014-02-23 ENCOUNTER — Other Ambulatory Visit (INDEPENDENT_AMBULATORY_CARE_PROVIDER_SITE_OTHER): Payer: Medicare Other

## 2014-02-23 ENCOUNTER — Other Ambulatory Visit: Payer: Self-pay | Admitting: *Deleted

## 2014-02-23 VITALS — BP 144/64 | HR 70

## 2014-02-23 DIAGNOSIS — Z79899 Other long term (current) drug therapy: Secondary | ICD-10-CM

## 2014-02-23 DIAGNOSIS — R0602 Shortness of breath: Secondary | ICD-10-CM

## 2014-02-23 LAB — BASIC METABOLIC PANEL
BUN: 19 mg/dL (ref 6–23)
CO2: 29 mEq/L (ref 19–32)
Calcium: 9.2 mg/dL (ref 8.4–10.5)
Chloride: 93 mEq/L — ABNORMAL LOW (ref 96–112)
Creatinine, Ser: 1.1 mg/dL (ref 0.4–1.2)
GFR: 49.74 mL/min — ABNORMAL LOW (ref >60.00)
Glucose, Bld: 86 mg/dL (ref 70–99)
Potassium: 3.8 mEq/L (ref 3.5–5.1)
Sodium: 130 mEq/L — ABNORMAL LOW (ref 135–145)

## 2014-02-23 LAB — BRAIN NATRIURETIC PEPTIDE: Pro B Natriuretic peptide (BNP): 28 pg/mL (ref 0.0–100.0)

## 2014-02-23 MED ORDER — CHLORTHALIDONE 25 MG PO TABS: 25.0000 mg | ORAL_TABLET | Freq: Every day | ORAL | Status: DC

## 2014-02-23 NOTE — Telephone Encounter (Signed)
Spoke with pt and she is taking chlorthalidone and hctz together. She wasn't aware that she was taking both and must have gotten confused. She thinks she has been on both medications for about 3 weeks maybe. Since she has been on both medications she will come for bmet today. She will stop HCTZ and stay on Chlrothalidone. Pts LE edema is better.

## 2014-02-23 NOTE — Progress Notes (Signed)
1.) Reason for visit:  Lab work  2.) Name of MD requesting visit: Dr.Varanasi  3.) H&P:  CAD, Hypertension, Allergic Rhinitis, pt reports history of COPD  4.) ROS related to problem: Pt here for lab work and complained of shortness of breath.  Oxygen sats 95% and 97% on room air.  Pt reports this has been on going for last 2 days. Wheezing noted. Pt reports she uses Spiriva inhaler. She reports productive cough of white sputum. Thinks she may be having problems with allergies.   5.) Assessment and plan per MD: Check BNP. Have pt follow up with primary care.  I spoke with Dr. Virgilio Belling office and they will see pt today at 1:45. Pt made aware of this appt.   6.) Provider sign-of(MD statement):  Sent to Dr. Irish Lack for review 7.)

## 2014-02-24 ENCOUNTER — Other Ambulatory Visit: Payer: Self-pay | Admitting: Cardiology

## 2014-02-24 MED ORDER — CHLORTHALIDONE 25 MG PO TABS: 25.0000 mg | ORAL_TABLET | Freq: Every day | ORAL | Status: DC

## 2014-03-01 NOTE — Telephone Encounter (Deleted)
S 

## 2014-03-01 NOTE — Telephone Encounter (Signed)
Follow up     Talk to Amy regarding medications you called in

## 2014-03-01 NOTE — Telephone Encounter (Signed)
Spoke with the pharmacy and pt hasnt picked amlodipine since Jan of 2015. They will go ahead and fill this medication. Pt did stop HCTZ and she is still taking chlorthalidone. Pt only has one pill left and it is going to cost her $27.00 a month or $80 for 3 months. Is there another option for pt to take that would be cheaper?  Also, pt did read off bottles to me and she is taking losartan 100 mg and plavix 75 mg as well.

## 2014-03-03 ENCOUNTER — Telehealth: Payer: Self-pay | Admitting: Interventional Cardiology

## 2014-03-03 MED ORDER — TRIAMTERENE-HCTZ 37.5-25 MG PO CAPS: 1.0000 | ORAL_CAPSULE | Freq: Every day | ORAL | Status: DC

## 2014-03-03 NOTE — Telephone Encounter (Signed)
See other telephone note.  

## 2014-03-03 NOTE — Telephone Encounter (Signed)
Sheryl Curtis at 03/03/2014 10:37 AM     Status: Signed        New message  Question about medication called in yesterday

## 2014-03-03 NOTE — Telephone Encounter (Signed)
New message     Question about medication called in yesterday

## 2014-03-03 NOTE — Telephone Encounter (Signed)
Spoke with pt and she just ran out of chlorthalidone. She states now she doesn't have amlodipine at home that she has fluticasone. She says she will go to pharmacy and pick up amlodipine and also start triamtere-hct 37.5-25 mg daily, which she will pick up at the pharmacy today. Pt agrees to come next week and bring all pill bottle to appt with the pharmacist on 03/10/14 at 2 pm. She has not checked her BP, but agrees to start checking BP tomorrow and record readings to bring to her appt next week.

## 2014-03-03 NOTE — Telephone Encounter (Deleted)
See other telephone note.  

## 2014-03-03 NOTE — Telephone Encounter (Signed)
Spoke with pt and she picked up rx for amlodipine and she thinks this medication gave her a rash in the past. It is not in her history that it did. Per Dr. Irish Lack pt can try amlodipine again and we can could switch chlorthalidone to Triamterene-hct 37.5-25 mg daily, but he would like for pt to see a pharmacist to go over all medication. Then we can confirm what pt is taking and what she is not taking. After medications reviewed we can then make med adjustments. Also, we will need to see how pts BP is doing.

## 2014-03-03 NOTE — Telephone Encounter (Signed)
Called pt back and bp today was 147/61.

## 2014-03-08 NOTE — Telephone Encounter (Signed)
Continue with current plan

## 2014-03-08 NOTE — Telephone Encounter (Signed)
Noted, pt has appt 03/10/14 with pharmacist to discuss medications and bring bottles in.

## 2014-03-10 ENCOUNTER — Encounter: Payer: Self-pay | Admitting: Pharmacist

## 2014-03-10 ENCOUNTER — Ambulatory Visit (INDEPENDENT_AMBULATORY_CARE_PROVIDER_SITE_OTHER): Payer: Medicare Other | Admitting: Pharmacist Clinician (PhC)/ Clinical Pharmacy Specialist

## 2014-03-10 VITALS — BP 156/60 | HR 76 | Ht 63.0 in | Wt 193.8 lb

## 2014-03-10 DIAGNOSIS — I10 Essential (primary) hypertension: Secondary | ICD-10-CM

## 2014-03-10 MED ORDER — CHLORTHALIDONE 25 MG PO TABS: 25.0000 mg | ORAL_TABLET | Freq: Every day | ORAL | Status: DC

## 2014-03-10 NOTE — Progress Notes (Signed)
03/10/2014 Sheryl Curtis Sheryl Curtis 1932-03-26 [id number]   HPI:  Sheryl Curtis is a 80 y.o. female patient of Dr Irish Lack, with a [place] below who presents today for a blood pressure check.  Sheryl Curtis had an LAD stent placed in 2014 and has seen a decrease in her angina since then.  She still complains of some SOB, especially when walking any distance or working too long in her garden.  She has arthritis in both hips and uses occasional hydrocodone for the pain.  She is not taking any anti-inflammatory medications due to the increased GI bleed risk and use of clopidogrel.  She is concerned over the many changes in her medications during the past few months, and is not sure she is taking what Dr. Irish Lack thinks she takes.  She currently takes all her medications in the mornings and does complain about upset stomach/nausea on some mornings. Sheryl. Curtis is not a smoker, having quit in 1964 after 12 years of smoking.  She does not drink alcohol and only drinks a daily cup of coffee and occasional pepsi.  She does her own cooking and only occasionally adds salt to specific foods.   Her exercise is limited due to the arthritis, however she does have flower and vegetable gardens that she spends much of her time tending.    I called Sheryl. Curtis this morning and asked that she bring all of her medications with her to the appointment today, so that we might sort out exactly what she has been taking, and help her to simplify the drug regimen.  In going over her medications, she has both 5 and 10mg  amlodipine tablets, chlorthalidone, hctz, and triam/hctz, losartan and losartan hct.  She has been taking the amlodipine 5mg , chlorthalidone 25mg , triam/hctz 37.5/25 and losartan 100mg , all each morning.   Current Outpatient Prescriptions  Medication Sig Dispense Refill  . amLODipine (NORVASC) 10 MG tablet 1 tablet daily.  90 tablet  3  . CALCIUM PO Take 1 tablet by mouth daily.      . clopidogrel (PLAVIX) 75 MG tablet Take 75  mg by mouth daily.      . DULoxetine (CYMBALTA) 30 MG capsule Take 60 mg by mouth daily.      Marland Kitchen losartan (COZAAR) 100 MG tablet Take 1 tablet (100 mg total) by mouth daily.  90 tablet  3  . Multiple Vitamin (MULTIVITAMIN WITH MINERALS) TABS Take 1 tablet by mouth daily.      . nitroGLYCERIN (NITROSTAT) 0.4 MG SL tablet Place 1 tablet (0.4 mg total) under the tongue every 5 (five) minutes as needed for chest pain.  90 tablet  3  . pantoprazole (PROTONIX) 40 MG tablet Take 1 tablet (40 mg total) by mouth daily.  30 tablet  11  . PRESCRIPTION MEDICATION Apply 1 drop to eye 2 (two) times daily.      . ranitidine (ZANTAC) 300 MG tablet Take 300 mg by mouth at bedtime.      . triamterene-hydrochlorothiazide (DYAZIDE) 37.5-25 MG per capsule Take 1 each (1 capsule total) by mouth daily.  30 capsule  6  . chlorthalidone (HYGROTON) 25 MG tablet Take 25 mg by mouth daily.      Marland Kitchen SPIRIVA HANDIHALER 18 MCG inhalation capsule Use as directed       No current facility-administered medications for this visit.    Allergies  Allergen Reactions  . Ace Inhibitors     REACTION: cough  . Aspirin  REACTION: hives  . Hydroxyquinolines   . Pamelor [Nortriptyline Hcl]   . Plaquenil [Hydroxychloroquine Sulfate]   . Prednisone     itchy  . Procardia [Nifedipine]     Past Medical History  Diagnosis Date  . Essential hypertension, benign   . Other and unspecified angina pectoris   . Atrial fibrillation   . ALLERGIC RHINITIS   . CANCER, COLORECTAL   . CHEST PAIN, ATYPICAL   . DYSPNEA   . FIBROMYALGIA   . G E R D   . INSOMNIA     Blood pressure 156/60, pulse 76, height 5\' 3"  (1.6 m), weight 193 lb 12 oz (87.884 kg). Standing BP 152/64   ASSESSMENT AND PLAN:  Today her BP is slightly higher than the JNC 8 guidelines of <150/90.  She reports that she took all of her medications this morning.  We spent some time going through all of her medications and talking about all the recent changes.  To simplify  her regimen I am going to leave her on the losartan 100 mg and chlorthalidone 25 mg and increase the amlodipine to 10 mg.  I have asked her to move the amlodipine to evenings, along with her ranitidine, to help ease the morning nausea associated with taking all of her pills at one time.  We marked the bottles with a blue high ligher to signify these are evening meds.  We disposed of the partial bottles of hctz and triam/hctz to help her avoid any further confusion.  Written instructions with medications listed as morning or evening were given to the patient.  I have asked her to check her BP 3-4 times per week, keep record of the results and bring that information, as well as her BP cuff, to a follow up visit in 6 weeks.  We can then assess the accuracy of her cuff and make any further changes as needed.  I have also called the local [facility] and had them discontinue hctz, losartan hctz, triam/hct and amlodpine 5mg , so as not to have more confusion in the future.  Tommy Medal PharmD CPP [facility]

## 2014-03-10 NOTE — Patient Instructions (Addendum)
Return for a a follow up appointment on June 18 at 11:30am  Your blood pressure today is slightly elevated at 156/60   Check your blood pressure at home daily (if able) and keep record of the readings.  Take your BP meds as follows:  AM - losartan 100mg         - chlorthalidone 25mg        -  Clopidogrel 75mg        - pantoprazole 40mg         - duloxetine 60mg    PM - amlodipine 10mg         - ranitidine 300mg   Bring your BP cuff and your record of home blood pressures to your next appointment.  Exercise as you're able, try to walk approximately 30 minutes per day.  Keep salt intake to a minimum, especially watch canned and prepared boxed foods.  Eat more fresh fruits and vegetables and fewer canned items.  Avoid eating in fast food restaurants.    HOW TO TAKE YOUR BLOOD PRESSURE:   Rest 5 minutes before taking your blood pressure.    Don't smoke or drink caffeinated beverages for at least 30 minutes before.   Take your blood pressure before (not after) you eat.   Sit comfortably with your back supported and both feet on the floor (don't cross your legs).   Elevate your arm to heart level on a table or a desk.   Use the proper sized cuff. It should fit smoothly and snugly around your bare upper arm. There should be enough room to slip a fingertip under the cuff. The bottom edge of the cuff should be 1 inch above the crease of the elbow.   Ideally, take 3 measurements at one sitting and record the average.

## 2014-04-02 ENCOUNTER — Encounter (HOSPITAL_COMMUNITY): Payer: Self-pay | Admitting: Emergency Medicine

## 2014-04-02 ENCOUNTER — Emergency Department (HOSPITAL_COMMUNITY): Payer: Medicare Other

## 2014-04-02 ENCOUNTER — Emergency Department (HOSPITAL_COMMUNITY)
Admission: EM | Admit: 2014-04-02 | Discharge: 2014-04-03 | Disposition: A | Discharge status: Home / Self Care | Payer: Medicare Other | Attending: Emergency Medicine | Admitting: Emergency Medicine

## 2014-04-02 DIAGNOSIS — K219 Gastro-esophageal reflux disease without esophagitis: Secondary | ICD-10-CM | POA: Diagnosis not present

## 2014-04-02 DIAGNOSIS — R42 Dizziness and giddiness: Secondary | ICD-10-CM | POA: Diagnosis not present

## 2014-04-02 DIAGNOSIS — R0789 Other chest pain: Secondary | ICD-10-CM | POA: Diagnosis not present

## 2014-04-02 DIAGNOSIS — Z7902 Long term (current) use of antithrombotics/antiplatelets: Secondary | ICD-10-CM | POA: Insufficient documentation

## 2014-04-02 DIAGNOSIS — I10 Essential (primary) hypertension: Secondary | ICD-10-CM | POA: Insufficient documentation

## 2014-04-02 DIAGNOSIS — R110 Nausea: Secondary | ICD-10-CM | POA: Insufficient documentation

## 2014-04-02 DIAGNOSIS — R0602 Shortness of breath: Secondary | ICD-10-CM | POA: Insufficient documentation

## 2014-04-02 DIAGNOSIS — Z87891 Personal history of nicotine dependence: Secondary | ICD-10-CM | POA: Insufficient documentation

## 2014-04-02 DIAGNOSIS — R109 Unspecified abdominal pain: Secondary | ICD-10-CM | POA: Diagnosis not present

## 2014-04-02 DIAGNOSIS — Z79899 Other long term (current) drug therapy: Secondary | ICD-10-CM | POA: Insufficient documentation

## 2014-04-02 DIAGNOSIS — Z85038 Personal history of other malignant neoplasm of large intestine: Secondary | ICD-10-CM | POA: Diagnosis not present

## 2014-04-02 DIAGNOSIS — Z8709 Personal history of other diseases of the respiratory system: Secondary | ICD-10-CM | POA: Insufficient documentation

## 2014-04-02 DIAGNOSIS — R079 Chest pain, unspecified: Secondary | ICD-10-CM | POA: Diagnosis not present

## 2014-04-02 LAB — LACTIC ACID, PLASMA: Lactic Acid, Venous: 1.8 mmol/L (ref 0.5–2.2)

## 2014-04-02 LAB — URINALYSIS, ROUTINE W REFLEX MICROSCOPIC
Bilirubin Urine: NEGATIVE
Glucose, UA: NEGATIVE mg/dL
Hgb urine dipstick: NEGATIVE
Ketones, ur: NEGATIVE mg/dL
Nitrite: NEGATIVE
Protein, ur: NEGATIVE mg/dL
Specific Gravity, Urine: 1.017 (ref 1.005–1.030)
Urobilinogen, UA: 0.2 mg/dL (ref 0.0–1.0)
pH: 5.5 (ref 5.0–8.0)

## 2014-04-02 LAB — CBC WITH DIFFERENTIAL/PLATELET
Basophils Absolute: 0 10*3/uL (ref 0.0–0.1)
Basophils Relative: 0 % (ref 0–1)
Eosinophils Absolute: 0.1 10*3/uL (ref 0.0–0.7)
Eosinophils Relative: 1 % (ref 0–5)
HCT: 40.3 % (ref 36.0–46.0)
Hemoglobin: 13.5 g/dL (ref 12.0–15.0)
Lymphocytes Relative: 14 % (ref 12–46)
Lymphs Abs: 1.7 10*3/uL (ref 0.7–4.0)
MCH: 30.5 pg (ref 26.0–34.0)
MCHC: 33.5 g/dL (ref 30.0–36.0)
MCV: 91.2 fL (ref 78.0–100.0)
Monocytes Absolute: 1.2 10*3/uL — ABNORMAL HIGH (ref 0.1–1.0)
Monocytes Relative: 10 % (ref 3–12)
Neutro Abs: 9.5 10*3/uL — ABNORMAL HIGH (ref 1.7–7.7)
Neutrophils Relative %: 75 % (ref 43–77)
Platelets: 295 10*3/uL (ref 150–400)
RBC: 4.42 MIL/uL (ref 3.87–5.11)
RDW: 13.7 % (ref 11.5–15.5)
WBC: 12.5 10*3/uL — ABNORMAL HIGH (ref 4.0–10.5)

## 2014-04-02 LAB — COMPREHENSIVE METABOLIC PANEL
ALT: 17 U/L (ref 0–35)
AST: 21 U/L (ref 0–37)
Albumin: 4.2 g/dL (ref 3.5–5.2)
Alkaline Phosphatase: 71 U/L (ref 39–117)
BUN: 36 mg/dL — ABNORMAL HIGH (ref 6–23)
CO2: 24 mEq/L (ref 19–32)
Calcium: 9.7 mg/dL (ref 8.4–10.5)
Chloride: 91 mEq/L — ABNORMAL LOW (ref 96–112)
Creatinine, Ser: 1.77 mg/dL — ABNORMAL HIGH (ref 0.50–1.10)
GFR calc Af Amer: 30 mL/min — ABNORMAL LOW (ref >90)
GFR calc non Af Amer: 26 mL/min — ABNORMAL LOW (ref >90)
Glucose, Bld: 115 mg/dL — ABNORMAL HIGH (ref 70–99)
Potassium: 3.2 mEq/L — ABNORMAL LOW (ref 3.7–5.3)
Sodium: 131 mEq/L — ABNORMAL LOW (ref 137–147)
Total Bilirubin: 0.5 mg/dL (ref 0.3–1.2)
Total Protein: 8.1 g/dL (ref 6.0–8.3)

## 2014-04-02 LAB — URINE MICROSCOPIC-ADD ON

## 2014-04-02 LAB — I-STAT TROPONIN, ED: Troponin i, poc: 0.01 ng/mL (ref 0.00–0.08)

## 2014-04-02 LAB — LIPASE, BLOOD: Lipase: 60 U/L — ABNORMAL HIGH (ref 11–59)

## 2014-04-02 MED ORDER — ONDANSETRON HCL 4 MG PO TABS: 4.0000 mg | ORAL_TABLET | Freq: Three times a day (TID) | ORAL | Status: DC | PRN

## 2014-04-02 MED ORDER — MORPHINE SULFATE 4 MG/ML IJ SOLN
4.0000 mg | Freq: Once | INTRAMUSCULAR | Status: AC
  Administered 2014-04-02: 4 mg via INTRAVENOUS
  Filled 2014-04-02: qty 1

## 2014-04-02 MED ORDER — IOHEXOL 300 MG/ML  SOLN
25.0000 mL | INTRAMUSCULAR | Status: AC
  Administered 2014-04-02: 25 mL via ORAL

## 2014-04-02 MED ORDER — SODIUM CHLORIDE 0.9 % IV BOLUS (SEPSIS)
1000.0000 mL | Freq: Once | INTRAVENOUS | Status: AC
  Administered 2014-04-02: 1000 mL via INTRAVENOUS

## 2014-04-02 NOTE — ED Provider Notes (Signed)
I saw and evaluated the patient, reviewed the resident's note and I agree with the findings and plan.   EKG Interpretation   Date/Time:  Saturday April 02 2014 16:48:26 EDT Ventricular Rate:  74 PR Interval:  134 QRS Duration: 86 QT Interval:  430 QTC Calculation: 477 R Axis:   74 Text Interpretation:  Normal sinus rhythm Normal ECG No significant change  since last tracing Confirmed by Kary Sugrue  MD-J, Dontavis Tschantz ([id number]) on 04/02/2014  6:43:25 PM      Labs Reviewed  CBC WITH DIFFERENTIAL - Abnormal; Notable for the following:    WBC 12.5 (*)    Neutro Abs 9.5 (*)    Monocytes Absolute 1.2 (*)    All other components within normal limits  COMPREHENSIVE METABOLIC PANEL - Abnormal; Notable for the following:    Sodium 131 (*)    Potassium 3.2 (*)    Chloride 91 (*)    Glucose, Bld 115 (*)    BUN 36 (*)    Creatinine, Ser 1.77 (*)    GFR calc non Af Amer 26 (*)    GFR calc Af Amer 30 (*)    All other components within normal limits  LIPASE, BLOOD - Abnormal; Notable for the following:    Lipase 60 (*)    All other components within normal limits  URINALYSIS, ROUTINE W REFLEX MICROSCOPIC - Abnormal; Notable for the following:    Leukocytes, UA SMALL (*)    All other components within normal limits  LACTIC ACID, PLASMA  URINE MICROSCOPIC-ADD ON  I-STAT TROPOININ, ED   Ct Abdomen Pelvis Wo Contrast  04/02/2014   CLINICAL DATA:  Right-sided abdominal pain. Loose stools. Personal history of colon carcinoma.  EXAM: CT ABDOMEN AND PELVIS WITHOUT CONTRAST  TECHNIQUE: Multidetector CT imaging of the abdomen and pelvis was performed following the standard protocol without IV contrast.  COMPARISON:  None.  FINDINGS: No evidence of renal calculi or hydronephrosis. No evidence of ureteral calculi or dilatation. No bladder calculi identified.  Prior hysterectomy noted. Adnexal regions are unremarkable in appearance. Noncontrast images of the liver, gallbladder, pancreas, spleen, and adrenal glands are  normal in appearance. No soft tissue masses or lymphadenopathy identified within the abdomen or pelvis.  Sigmoid diverticulosis is noted, however there is no evidence of diverticulitis. No other inflammatory process or abnormal fluid collections are identified. No evidence of bowel wall thickening or dilatation. No suspicious bone lesions identified.  Images obtained through the lung bases show bilateral noncalcified pulmonary nodules, with largest measure 7 mm in the posterior right lower lobe on image 24.  IMPRESSION: No acute findings within the abdomen or pelvis.  Diverticulosis. No radiographic evidence of diverticulitis.  Bilateral indeterminate pulmonary nodules in visualized portions of lung bases, largest measuring 7 mm in the posterior right lower lobe. Metastatic disease cannot be excluded in patient with history of colon carcinoma. Recommend clinical correlation with tumor markers, and consider PET-CT scan or 3 month follow-up chest CT.   Electronically Signed   By: Earle Gell M.D.   On: 04/02/2014 21:36   Dg Chest 2 View  04/02/2014   CLINICAL DATA:  Chest pain and weakness  EXAM: CHEST  2 VIEW  COMPARISON:  Mar 23, 2013  FINDINGS: There is no edema or consolidation. Heart size and pulmonary vascularity are normal. No adenopathy. No pneumothorax. There is atherosclerotic change in the aorta. There is degenerative change in the thoracic spine.  IMPRESSION: No edema or consolidation.   Electronically Signed   By:  Lowella Grip M.D.   On: 04/02/2014 21:11    Pt presented to ED with epigastric abdominal pain.  Slight increase in lipase but do not think this is significant enough to account for her symptoms.  CT scan remarkable with the exception of a need for follow up imaging.  Pt will follow up with PCP  Dorie Rank, MD 04/03/14 [phone]

## 2014-04-02 NOTE — ED Provider Notes (Signed)
CSN: [id number]     Arrival date & time 04/02/14  1613 History   First MD Initiated Contact with Patient 04/02/14 1732     Chief Complaint  Patient presents with  . Abdominal Pain     (Consider location/radiation/quality/duration/timing/severity/associated sxs/prior Treatment) Patient is a 80 y.o. female presenting with abdominal pain. The history is provided by the patient and a relative.  Abdominal Pain Pain location:  Epigastric Pain quality: squeezing and stabbing   Pain radiation: right abdomen. Pain severity:  Severe Onset quality:  Gradual Timing:  Intermittent Progression:  Worsening Chronicity:  New Relieved by: rest. Worsened by:  Movement and palpation Associated symptoms: nausea and shortness of breath (chronic. no change)   Associated symptoms: no chest pain, no chills, no constipation, no cough, no diarrhea, no dysuria, no fever, no hematuria and no vomiting     80 yo F pw abd pain. Epigastric. Onset 4 days ago. Intermittent. Now constant. Worse with ambulating, movement and with eating. Onset few minutes after eating. Associated nausea at times. No emesis. Increased BM's, but not loose. No constipation.  No urinary changes. Denies  CP.  Chronic SOB for multiple years that is unchanged.  H/o CAD sp stent.  H/o hysterectomy, diverticulitis. Still has GB.    Past Medical History  Diagnosis Date  . Essential hypertension, benign   . Other and unspecified angina pectoris   . Atrial fibrillation   . ALLERGIC RHINITIS   . CANCER, COLORECTAL   . CHEST PAIN, ATYPICAL   . DYSPNEA   . FIBROMYALGIA   . G E R D   . INSOMNIA    History reviewed. No pertinent past surgical history. Family History  Problem Relation Age of Onset  . CAD Brother   . Heart attack Brother 42   History  Substance Use Topics  . Smoking status: Former Smoker -- 12 years    Start date: 10/28/1949    Quit date: 10/28/1961  . Smokeless tobacco: Not on file  . Alcohol Use: No   OB History    Grav Para Term Preterm Abortions TAB SAB Ect Mult Living                 Review of Systems  Constitutional: Negative for fever and chills.  HENT: Negative for congestion and rhinorrhea.   Respiratory: Positive for shortness of breath (chronic. no change). Negative for cough.   Cardiovascular: Negative for chest pain and leg swelling.  Gastrointestinal: Positive for nausea and abdominal pain. Negative for vomiting, diarrhea, constipation and blood in stool.  Genitourinary: Negative for dysuria, hematuria, flank pain and difficulty urinating.  Musculoskeletal: Negative for back pain.  Neurological: Positive for light-headedness (mild). Negative for headaches.  All other systems reviewed and are negative.     Allergies  Ace inhibitors; Aspirin; Hydroxyquinolines; Pamelor; Prednisone; Procardia; and Plaquenil  Home Medications   Prior to Admission medications   Medication Sig Start Date End Date Taking? Authorizing Provider  amLODipine (NORVASC) 10 MG tablet 1 tablet daily. 01/13/14  Yes Jettie Booze, MD  CALCIUM PO Take 1 tablet by mouth daily.   Yes Historical Provider, MD  chlorthalidone (HYGROTON) 25 MG tablet Take 1 tablet (25 mg total) by mouth daily. 03/10/14  Yes Jettie Booze, MD  clopidogrel (PLAVIX) 75 MG tablet Take 75 mg by mouth daily.   Yes Historical Provider, MD  DULoxetine (CYMBALTA) 30 MG capsule Take 60 mg by mouth daily. 07/30/13  Yes Jettie Booze, MD  losartan (COZAAR) 100  MG tablet Take 100 mg by mouth daily.   Yes Historical Provider, MD  Multiple Vitamin (MULTIVITAMIN WITH MINERALS) TABS Take 1 tablet by mouth daily.   Yes Historical Provider, MD  nitroGLYCERIN (NITROSTAT) 0.4 MG SL tablet Place 1 tablet (0.4 mg total) under the tongue every 5 (five) minutes as needed for chest pain. 07/30/13  Yes Jettie Booze, MD  pantoprazole (PROTONIX) 40 MG tablet Take 1 tablet (40 mg total) by mouth daily. 07/30/13  Yes Jettie Booze, MD   PRESCRIPTION MEDICATION Apply 1 drop to eye 2 (two) times daily.   Yes Historical Provider, MD  ranitidine (ZANTAC) 300 MG tablet Take 300 mg by mouth at bedtime.   Yes Historical Provider, MD  SPIRIVA HANDIHALER 18 MCG inhalation capsule Use as directed 02/02/14  Yes Historical Provider, MD  triamterene-hydrochlorothiazide (DYAZIDE) 37.5-25 MG per capsule Take 1 each (1 capsule total) by mouth daily. 03/03/14  Yes Jettie Booze, MD  ondansetron (ZOFRAN) 4 MG tablet Take 1 tablet (4 mg total) by mouth every 8 (eight) hours as needed for nausea or vomiting. 04/02/14   Bonnita Hollow, MD   BP 171/65  Pulse 82  Temp(Src) 97.7 F (36.5 C) (Oral)  Resp 18  Ht 5\' 4"  (1.626 m)  Wt 188 lb (85.276 kg)  BMI 32.25 kg/m2  SpO2 97% Physical Exam  Nursing note and vitals reviewed. Constitutional: She is oriented to person, place, and time. She appears well-developed and well-nourished. No distress.  HENT:  Head: Normocephalic and atraumatic.  Eyes: Conjunctivae are normal. Right eye exhibits no discharge. Left eye exhibits no discharge.  Cardiovascular: Normal rate, regular rhythm, normal heart sounds and intact distal pulses.   Pulmonary/Chest: Effort normal and breath sounds normal. No stridor. No respiratory distress. She has no wheezes. She has no rales.  Abdominal: Soft. She exhibits no distension. There is tenderness (moderate epigastric). There is no guarding and negative Murphy's sign.  Musculoskeletal: She exhibits no edema and no tenderness.  Neurological: She is alert and oriented to person, place, and time.  Skin: Skin is warm and dry.  Psychiatric: She has a normal mood and affect. Her behavior is normal.    ED Course  Procedures (including critical care time) Labs Review Labs Reviewed  CBC WITH DIFFERENTIAL - Abnormal; Notable for the following:    WBC 12.5 (*)    Neutro Abs 9.5 (*)    Monocytes Absolute 1.2 (*)    All other components within normal limits  COMPREHENSIVE  METABOLIC PANEL - Abnormal; Notable for the following:    Sodium 131 (*)    Potassium 3.2 (*)    Chloride 91 (*)    Glucose, Bld 115 (*)    BUN 36 (*)    Creatinine, Ser 1.77 (*)    GFR calc non Af Amer 26 (*)    GFR calc Af Amer 30 (*)    All other components within normal limits  LIPASE, BLOOD - Abnormal; Notable for the following:    Lipase 60 (*)    All other components within normal limits  URINALYSIS, ROUTINE W REFLEX MICROSCOPIC - Abnormal; Notable for the following:    Leukocytes, UA SMALL (*)    All other components within normal limits  LACTIC ACID, PLASMA  URINE MICROSCOPIC-ADD ON  I-STAT TROPOININ, ED    Imaging Review Ct Abdomen Pelvis Wo Contrast  04/02/2014   CLINICAL DATA:  Right-sided abdominal pain. Loose stools. Personal history of colon carcinoma.  EXAM: CT ABDOMEN AND PELVIS  WITHOUT CONTRAST  TECHNIQUE: Multidetector CT imaging of the abdomen and pelvis was performed following the standard protocol without IV contrast.  COMPARISON:  None.  FINDINGS: No evidence of renal calculi or hydronephrosis. No evidence of ureteral calculi or dilatation. No bladder calculi identified.  Prior hysterectomy noted. Adnexal regions are unremarkable in appearance. Noncontrast images of the liver, gallbladder, pancreas, spleen, and adrenal glands are normal in appearance. No soft tissue masses or lymphadenopathy identified within the abdomen or pelvis.  Sigmoid diverticulosis is noted, however there is no evidence of diverticulitis. No other inflammatory process or abnormal fluid collections are identified. No evidence of bowel wall thickening or dilatation. No suspicious bone lesions identified.  Images obtained through the lung bases show bilateral noncalcified pulmonary nodules, with largest measure 7 mm in the posterior right lower lobe on image 24.  IMPRESSION: No acute findings within the abdomen or pelvis.  Diverticulosis. No radiographic evidence of diverticulitis.  Bilateral  indeterminate pulmonary nodules in visualized portions of lung bases, largest measuring 7 mm in the posterior right lower lobe. Metastatic disease cannot be excluded in patient with history of colon carcinoma. Recommend clinical correlation with tumor markers, and consider PET-CT scan or 3 month follow-up chest CT.   Electronically Signed   By: Earle Gell M.D.   On: 04/02/2014 21:36   Dg Chest 2 View  04/02/2014   CLINICAL DATA:  Chest pain and weakness  EXAM: CHEST  2 VIEW  COMPARISON:  Mar 23, 2013  FINDINGS: There is no edema or consolidation. Heart size and pulmonary vascularity are normal. No adenopathy. No pneumothorax. There is atherosclerotic change in the aorta. There is degenerative change in the thoracic spine.  IMPRESSION: No edema or consolidation.   Electronically Signed   By: Lowella Grip M.D.   On: 04/02/2014 21:11     EKG Interpretation   Date/Time:  Saturday April 02 2014 16:48:26 EDT Ventricular Rate:  74 PR Interval:  134 QRS Duration: 86 QT Interval:  430 QTC Calculation: 477 R Axis:   74 Text Interpretation:  Normal sinus rhythm Normal ECG No significant change  since last tracing Confirmed by KNAPP  MD-J, JON ([id number]) on 04/02/2014  6:43:25 PM      MDM   Final diagnoses:  Abdominal pain    Abd pain. Initially epigastric. Unclear etiology. Labs largely reassuring. Mild bump in creatinine. Given IVF. CT scan to further assess. Oral contrast 2/2 elevated creatinine.   Patient reassessed. Pain significantly improved. Repeat abd exam benign. Neg murphys. CT scan without emergent findings. Without findings of obstruction. No bowel wall changes to suggest colitis, enteritis. Lactate wnl. Doubt mesenteric ischemia. No diverticulitis. Do not suspect dissection. Without evidence of AAA. Doubt UTI. No dysuria.   As patient with benign exam, tolerating po's, HDS will plan for further mgmt as outpatient. Discussed with patient and family.   Pulmonary nodules on CT scan.  Notified patient. F/u pcp.   Patient discharged home. Strict return precautions given. To follow up with pcp. Patient and family in agreement with plan.  Labs and imaging reviewed by myself and considered in medical decision making if ordered. Imaging interpreted by radiology.   Discussed case with Dr. Tomi Bamberger who is in agreement with assessment and plan.       Bonnita Hollow, MD 04/03/14 [phone]

## 2014-04-02 NOTE — ED Notes (Signed)
Pt went to restroom, unable to obtain sample. Pt returned from restroom and reports feeling lightheaded and skin is clammy.

## 2014-04-02 NOTE — ED Notes (Signed)
Pt reports having mid abd pain that increases after she eats. Radiates down right side of abd. Pain started on wed and having frequent stools, denies vomiting. No acute distress noted at triage.

## 2014-04-02 NOTE — ED Notes (Signed)
Pt to go to CT at 2030.

## 2014-04-02 NOTE — Discharge Instructions (Signed)
Abdominal Pain, Adult Many things can cause abdominal pain. Usually, abdominal pain is not caused by a disease and will improve without treatment. It can often be observed and treated at home. Your health care provider will do a physical exam and possibly order blood tests and X-rays to help determine the seriousness of your pain. However, in many cases, more time must pass before a clear cause of the pain can be found. Before that point, your health care provider may not know if you need more testing or further treatment. HOME CARE INSTRUCTIONS  Monitor your abdominal pain for any changes. The following actions may help to alleviate any discomfort you are experiencing:  Only take over-the-counter or prescription medicines as directed by your health care provider.  Do not take laxatives unless directed to do so by your health care provider.  Try a clear liquid diet (broth, tea, or water) as directed by your health care provider. Slowly move to a bland diet as tolerated. SEEK MEDICAL CARE IF:  You have unexplained abdominal pain.  You have abdominal pain associated with nausea or diarrhea.  You have pain when you urinate or have a bowel movement.  You experience abdominal pain that wakes you in the night.  You have abdominal pain that is worsened or improved by eating food.  You have abdominal pain that is worsened with eating fatty foods. SEEK IMMEDIATE MEDICAL CARE IF:   Your pain does not go away within 2 hours.  You have a fever.  You keep throwing up (vomiting).  Your pain is felt only in portions of the abdomen, such as the right side or the left lower portion of the abdomen.  You pass bloody or black tarry stools. MAKE SURE YOU:  Understand these instructions.   Will watch your condition.   Will get help right away if you are not doing well or get worse.  Document Released: 07/24/2005 Document Revised: 08/04/2013 Document Reviewed: 06/23/2013 [facility] Patient  Information 2014 [place].    Of note, on CT scan there were some nodules on your lungs. It is important to follow up with your primary care physician regarding this.

## 2014-04-07 DIAGNOSIS — R1084 Generalized abdominal pain: Secondary | ICD-10-CM | POA: Insufficient documentation

## 2014-04-11 ENCOUNTER — Other Ambulatory Visit: Payer: Medicare Other

## 2014-04-11 ENCOUNTER — Institutional Professional Consult (permissible substitution): Payer: Medicare Other | Admitting: Pulmonary Disease

## 2014-04-11 ENCOUNTER — Ambulatory Visit (INDEPENDENT_AMBULATORY_CARE_PROVIDER_SITE_OTHER): Payer: Medicare Other | Admitting: Pulmonary Disease

## 2014-04-11 ENCOUNTER — Encounter: Payer: Self-pay | Admitting: Pulmonary Disease

## 2014-04-11 VITALS — BP 140/78 | HR 99 | Temp 97.0°F | Ht 64.0 in | Wt 189.0 lb

## 2014-04-11 DIAGNOSIS — R911 Solitary pulmonary nodule: Secondary | ICD-10-CM

## 2014-04-11 DIAGNOSIS — R0600 Dyspnea, unspecified: Secondary | ICD-10-CM

## 2014-04-11 DIAGNOSIS — R0989 Other specified symptoms and signs involving the circulatory and respiratory systems: Secondary | ICD-10-CM

## 2014-04-11 DIAGNOSIS — R0609 Other forms of dyspnea: Secondary | ICD-10-CM

## 2014-04-11 DIAGNOSIS — R918 Other nonspecific abnormal finding of lung field: Secondary | ICD-10-CM | POA: Insufficient documentation

## 2014-04-11 DIAGNOSIS — R0602 Shortness of breath: Secondary | ICD-10-CM

## 2014-04-11 LAB — D-DIMER, QUANTITATIVE: D-Dimer, Quant: 0.28 ug/mL-FEU (ref 0.00–0.48)

## 2014-04-11 MED ORDER — BUDESONIDE-FORMOTEROL FUMARATE 160-4.5 MCG/ACT IN AERO: 2.0000 | INHALATION_SPRAY | Freq: Two times a day (BID) | RESPIRATORY_TRACT | Status: DC

## 2014-04-11 NOTE — Assessment & Plan Note (Signed)
etiology is not clear. Cardiac function appears good, LAD stent was placed in 2014. Lung capacity appears mildly decreased compared to 2011, but her smoking history is not impressive Trial of symbicort 2 puffs twice daily check D d-imer for completion, unfortunately renal function is poor and I would not perform CT angiogram. She does not meet criteria for handicap placard from pulmonary standpoint, but probably does based on her overall status Copies of tests given to daughter, suggest reevaluation by cardiology

## 2014-04-11 NOTE — Assessment & Plan Note (Signed)
Suggest repeat CT in 3 months. These were not present on CT abdomen in 2003. Etiology is not clear, note negative CT abdomen for primary malignancy. These nodules are very small and below resolution of a PET scan at this time.

## 2014-04-11 NOTE — Progress Notes (Signed)
Subjective:    Patient ID: Sheryl Curtis, female    DOB: 09-Sep-1932, 80 y.o.   MRN: [id number]  HPI PCP- Donnie Coffin  80 year old remote smoker, presents for evaluation of incidental finding of pulmonary nodule on CT abdomen. She is accompanied by her daughter Sheryl Curtis who has numerous questions. She reports epigastric pain ongoing for 3 weeks with loose stools for one to 2 days, prior history of rectal cancer status post resection in 2007,underwent CT abdomen that did not identify cause but showed bilateral noncalcified pulmonary nodules, largest measuring 7 mm in the posterior right lower lobe. This was not noted on a prior CT in 2003.  She also reports dyspnea on exertion for almost 2 years. She underwent stent to LAD in 04/2013. Echo has shown normal LV function without any evidence of diastolic dysfunction. Spirometry in 05/2010 showed FEV1 of 76% improvement postbronchodilator to 80%-1.60 with a ratio of 75 suggesting mild restriction. She was placed on Spiriva in 2011 by my partner, dr Lamonte Sakai, but she stopped this soon after since the did not help. She had been on Advair prior which was stopped. ACE inhibitor was stopped due to cough. Her daughter reports severe dyspnea, she has required a handicapped card, she expresses frustration that no one has identified cause of dyspnea. Spirometry today shows FEV1 of 1.25-64%, with ratio 65 and FVC of 1.91-72%. She did not desaturate after walking 2 laps in the office but was limited by shortness of breath. She quit smoking in 1964 and smoked about 20-pack-years prior.   Past Medical History  Diagnosis Date  . Essential hypertension, benign   . Other and unspecified angina pectoris   . Atrial fibrillation   . ALLERGIC RHINITIS   . CANCER, COLORECTAL   . CHEST PAIN, ATYPICAL   . DYSPNEA   . FIBROMYALGIA   . G E R D   . INSOMNIA    Past Surgical History  Procedure Laterality Date  . Back surgery    . Colon surgery    . Coronary stent  placement    . Abdominal hysterectomy    . Breast surgery      Allergies  Allergen Reactions  . Ace Inhibitors     REACTION: cough  . Aspirin     REACTION: hives  . Hydroxyquinolines Hives  . Pamelor [Nortriptyline Hcl] Other (See Comments)  . Prednisone     itchy  . Procardia [Nifedipine] Other (See Comments)  . Plaquenil [Hydroxychloroquine Sulfate] Rash    History   Social History  . Marital Status: Widowed    Spouse Name: N/A    Number of Children: 4  . Years of Education: N/A   Occupational History  . Not on file.   Social History Main Topics  . Smoking status: Former Smoker -- 1.00 packs/day for 12 years    Types: Cigarettes    Start date: 10/28/1949    Quit date: 10/28/1962  . Smokeless tobacco: Not on file  . Alcohol Use: No  . Drug Use: No  . Sexual Activity: Not on file   Other Topics Concern  . Not on file   Social History Narrative  . No narrative on file    Family History  Problem Relation Age of Onset  . CAD Brother   . Heart attack Brother 54  . Bone cancer Sister   . Throat cancer Daughter       Review of Systems  Constitutional: Negative for fever and unexpected weight change.  HENT:  Negative for congestion, dental problem, ear pain, nosebleeds, postnasal drip, rhinorrhea, sinus pressure, sneezing, sore throat and trouble swallowing.   Eyes: Negative for redness and itching.  Respiratory: Positive for cough and shortness of breath. Negative for chest tightness and wheezing.   Cardiovascular: Negative for palpitations and leg swelling.  Gastrointestinal: Negative for nausea and vomiting.  Genitourinary: Negative for dysuria.  Musculoskeletal: Negative for joint swelling.  Skin: Negative for rash.  Neurological: Negative for headaches.  Hematological: Does not bruise/bleed easily.  Psychiatric/Behavioral: Negative for dysphoric mood. The patient is not nervous/anxious.        Objective:   Physical Exam  Gen. Pleasant, obese,  in no distress, normal affect ENT - no lesions, no post nasal drip, class 2-3 airway Neck: No JVD, no thyromegaly, no carotid bruits Lungs: no use of accessory muscles, no dullness to percussion, decreased without rales or rhonchi  Cardiovascular: Rhythm regular, heart sounds  normal, no murmurs or gallops, no peripheral edema Abdomen: soft and non-tender, no hepatosplenomegaly, BS normal. Musculoskeletal: No deformities, no cyanosis or clubbing Neuro:  alert, non focal, no tremors        Assessment & Plan:

## 2014-04-11 NOTE — Patient Instructions (Addendum)
Follow UP CT scan in 3 months for lung nodules Lung capacity appears mildly decreased Trial of symbicort 2 puffs twice daily Blood work today - D d-imer Copies of tests given

## 2014-04-14 ENCOUNTER — Encounter: Payer: Self-pay | Admitting: Pharmacist

## 2014-04-14 ENCOUNTER — Ambulatory Visit: Payer: Medicare Other | Admitting: Pharmacist

## 2014-04-14 ENCOUNTER — Ambulatory Visit (INDEPENDENT_AMBULATORY_CARE_PROVIDER_SITE_OTHER): Payer: Medicare Other | Admitting: Pharmacist Clinician (PhC)/ Clinical Pharmacy Specialist

## 2014-04-14 VITALS — BP 155/68 | HR 76 | Ht 63.0 in

## 2014-04-14 DIAGNOSIS — I10 Essential (primary) hypertension: Secondary | ICD-10-CM

## 2014-04-14 MED ORDER — AMLODIPINE-VALSARTAN-HCTZ 10-320-25 MG PO TABS: ORAL_TABLET | ORAL | Status: DC

## 2014-04-14 MED ORDER — POTASSIUM CHLORIDE ER 10 MEQ PO TBCR: 10.0000 meq | EXTENDED_RELEASE_TABLET | Freq: Every day | ORAL | Status: DC

## 2014-04-14 NOTE — Progress Notes (Signed)
04/14/2014 Sheryl Curtis Sheryl Curtis 1932/01/24 [id number]   HPI:  Sheryl Curtis is a 80 y.o. female patient of Dr Irish Lack, with a [place] who presents today for a blood pressure check.  Sheryl Curtis had an LAD stent placed in 2014 and has seen a decrease in Sheryl angina since then.  When I last saw Sheryl, she brought all of Sheryl medication bottles with Sheryl and we spent some time going through each of them and eliminating some of the duplicates.  At the end, we left Sheryl on amlodipine 10mg , chlorthalidone 25mg  and losartan 100mg  for BP control.  We also divided Sheryl meds into am and pm meds to try and decrease the GI upset/nausea she had complained of.  She states this is much improved.    Today she is here with Sheryl Curtis, and still seems to be confused about medications.  She states that she puts Sheryl pills into a weekly pill-minder and is compliant with dosing, but at the same time, doesn't know what most of Sheryl pills are for, even though some have indications written on the bottle.  She brings in Sheryl BP cuff today, a [employer] meter that she bought last summer.  She has only taken 6 readings since Sheryl last visit, and only one of those was WNL, just last week, at 124/64.  The other readings were in the 338-250 systolic range.     Sheryl Curtis is not a smoker, having quit in 1964 after 12 years of smoking.  She does not drink alcohol and only drinks a daily cup of coffee and occasional pepsi.  She does Sheryl own cooking and only occasionally adds salt to specific foods.   Sheryl exercise is limited due to the arthritis, and while she likes to spend time outside, it has been much too hot for the past several weeks.    Recent labs show a drop in potassium to 3.2, she states Sheryl primary MD asked Sheryl to increase potassium in Sheryl diet (orange juice, bananas).  However she thought they said calcium, so in addition to some potassium rich foods, she increased Sheryl calcium intake as well.     Current Outpatient Prescriptions    Medication Sig Dispense Refill  . Amlodipine-Valsartan-HCTZ 10-320-25 MG TABS Take 1 tablet by mouth daily in the morning for blood pressure  90 tablet  1  . budesonide-formoterol (SYMBICORT) 160-4.5 MCG/ACT inhaler Inhale 2 puffs into the lungs 2 (two) times daily.  1 Inhaler  0  . CALCIUM PO Take 1 tablet by mouth daily.      . clopidogrel (PLAVIX) 75 MG tablet Take 75 mg by mouth daily.      . DULoxetine (CYMBALTA) 30 MG capsule Take 60 mg by mouth daily.      . Multiple Vitamin (MULTIVITAMIN WITH MINERALS) TABS Take 1 tablet by mouth daily.      . nitroGLYCERIN (NITROSTAT) 0.4 MG SL tablet Place 1 tablet (0.4 mg total) under the tongue every 5 (five) minutes as needed for chest pain.  90 tablet  3  . ondansetron (ZOFRAN) 4 MG tablet Take 1 tablet (4 mg total) by mouth every 8 (eight) hours as needed for nausea or vomiting.  15 tablet  0  . pantoprazole (PROTONIX) 40 MG tablet Take 1 tablet (40 mg total) by mouth daily.  30 tablet  11  . potassium chloride (K-DUR) 10 MEQ tablet Take 1 tablet (10 mEq total) by mouth daily.  30 tablet  3  .  PRESCRIPTION MEDICATION Apply 1 drop to eye 2 (two) times daily.      . ranitidine (ZANTAC) 300 MG tablet Take 300 mg by mouth at bedtime.      Marland Kitchen SPIRIVA HANDIHALER 18 MCG inhalation capsule Use as directed       No current facility-administered medications for this visit.    Allergies  Allergen Reactions  . Ace Inhibitors     REACTION: cough  . Aspirin     REACTION: hives  . Hydroxyquinolines Hives  . Imipramine Hcl   . Pamelor [Nortriptyline Hcl] Other (See Comments)  . Prednisone     itchy  . Procardia [Nifedipine] Other (See Comments)  . Plaquenil [Hydroxychloroquine Sulfate] Rash    Past Medical History  Diagnosis Date  . Essential hypertension, benign   . Other and unspecified angina pectoris   . Atrial fibrillation   . ALLERGIC RHINITIS   . CANCER, COLORECTAL   . CHEST PAIN, ATYPICAL   . DYSPNEA   . FIBROMYALGIA   . G E R D    . INSOMNIA     Blood pressure 155/68, pulse 76, height 5\' 3"  (1.6 m). Standing BP 152/64   ASSESSMENT AND PLAN:  Today Sheryl BP is slightly higher than the JNC 8 guidelines of <150/90, and essentially unchanged from Sheryl last visit.  Sheryl home BP cuff was accurate to within 5 points at 162/66.  She reports that she took all of Sheryl medications this morning.  We again went through Sheryl medications and verified what she takes.  Sheryl Curtis seems concerned that there might still be some confusion when it comes to Sheryl meds, and patient is concerned that she takes "too many" pills to control Sheryl BP.  To help ease this confusion and potential compliance problem, I am going to switch Sheryl to Exforge hct, which has recently gone generic and is a Tier 2 copay on Sheryl medical plan.  This will bring Sheryl from 3 tablets to 1, giving [place] a more potent ARB as well.  I have given Sheryl orders to have a BMET drawn in 1-2 weeks and prescribed potassium 52mEq daily to help with the hypokalemia.  She has pending lab work with Sheryl PCP, and I asked Sheryl to take this to them, so as not to duplicate services.  The Exforge hct was sent to Sheryl mail order pharmacy, and she (as well as Sheryl Curtis) understand to continue current regimen until the new prescription arrives, then they will dispose of any remaining tablets.  I will see Sheryl again in 6 weeks, which should be about 1 month after switching to the new medication.    Tommy Medal PharmD CPP [facility]

## 2014-04-14 NOTE — Patient Instructions (Signed)
Return for a a follow up appointment in 6 weeks  Your blood pressure today is 155/66  Check your blood pressure at home 3 times per week and keep record of the readings.  Take your BP meds as follows: amlodipine/valsartan/hctz 10/320/25 - take 1 tablet each morning.   Stop taking the amlodipine, chlorthalidone and losartan tablets when the new prescription arrives  Bring all of your meds, your record of home blood pressures to your next appointment.  Exercise as you're able, try to walk approximately 30 minutes per day.  Eat more fresh fruits and vegetables and fewer canned items.  Avoid eating in fast food restaurants.    HOW TO TAKE YOUR BLOOD PRESSURE:   Rest 5 minutes before taking your blood pressure.    Don't smoke or drink caffeinated beverages for at least 30 minutes before.   Take your blood pressure before (not after) you eat.   Sit comfortably with your back supported and both feet on the floor (don't cross your legs).   Elevate your arm to heart level on a table or a desk.   Use the proper sized cuff. It should fit smoothly and snugly around your bare upper arm. There should be enough room to slip a fingertip under the cuff. The bottom edge of the cuff should be 1 inch above the crease of the elbow.   Ideally, take 3 measurements at one sitting and record the average.

## 2014-05-05 ENCOUNTER — Encounter: Payer: Self-pay | Admitting: Interventional Cardiology

## 2014-05-25 ENCOUNTER — Ambulatory Visit: Payer: Medicare Other | Admitting: Pharmacist Clinician (PhC)/ Clinical Pharmacy Specialist

## 2014-06-01 ENCOUNTER — Other Ambulatory Visit: Payer: Self-pay

## 2014-06-01 MED ORDER — PANTOPRAZOLE SODIUM 40 MG PO TBEC: 40.0000 mg | DELAYED_RELEASE_TABLET | Freq: Every day | ORAL | Status: DC

## 2014-06-08 ENCOUNTER — Other Ambulatory Visit: Payer: Self-pay | Admitting: *Deleted

## 2014-06-08 MED ORDER — PANTOPRAZOLE SODIUM 40 MG PO TBEC: 40.0000 mg | DELAYED_RELEASE_TABLET | Freq: Every day | ORAL | Status: DC

## 2014-06-13 ENCOUNTER — Ambulatory Visit (INDEPENDENT_AMBULATORY_CARE_PROVIDER_SITE_OTHER)
Admission: RE | Admit: 2014-06-13 | Discharge: 2014-06-13 | Disposition: A | Discharge status: Home / Self Care | Payer: Medicare Other | Source: Ambulatory Visit | Attending: Pulmonary Disease | Admitting: Pulmonary Disease

## 2014-06-13 DIAGNOSIS — R911 Solitary pulmonary nodule: Secondary | ICD-10-CM

## 2014-07-07 ENCOUNTER — Encounter (INDEPENDENT_AMBULATORY_CARE_PROVIDER_SITE_OTHER): Payer: Self-pay | Admitting: Ophthalmology

## 2014-07-12 ENCOUNTER — Ambulatory Visit
Admission: RE | Admit: 2014-07-12 | Discharge: 2014-07-12 | Disposition: A | Discharge status: Home / Self Care | Payer: Medicare Other | Source: Ambulatory Visit | Attending: Family Medicine | Admitting: Family Medicine

## 2014-07-12 ENCOUNTER — Other Ambulatory Visit: Payer: Self-pay | Admitting: Family Medicine

## 2014-07-12 ENCOUNTER — Encounter: Payer: Self-pay | Admitting: Interventional Cardiology

## 2014-07-12 DIAGNOSIS — S93409A Sprain of unspecified ligament of unspecified ankle, initial encounter: Secondary | ICD-10-CM

## 2014-07-19 ENCOUNTER — Ambulatory Visit: Payer: Medicare Other | Admitting: Interventional Cardiology

## 2014-07-20 ENCOUNTER — Encounter (INDEPENDENT_AMBULATORY_CARE_PROVIDER_SITE_OTHER): Payer: Self-pay | Admitting: Ophthalmology

## 2014-08-16 ENCOUNTER — Ambulatory Visit: Payer: Medicare Other | Admitting: Interventional Cardiology

## 2014-09-01 ENCOUNTER — Other Ambulatory Visit: Payer: Self-pay

## 2014-09-01 MED ORDER — PANTOPRAZOLE SODIUM 40 MG PO TBEC: 40.0000 mg | DELAYED_RELEASE_TABLET | Freq: Every day | ORAL | Status: DC

## 2014-09-01 MED ORDER — CLOPIDOGREL BISULFATE 75 MG PO TABS: 75.0000 mg | ORAL_TABLET | Freq: Every day | ORAL | Status: DC

## 2014-09-02 ENCOUNTER — Other Ambulatory Visit: Payer: Self-pay | Admitting: Interventional Cardiology

## 2014-09-02 MED ORDER — AMLODIPINE-VALSARTAN-HCTZ 10-320-25 MG PO TABS: ORAL_TABLET | ORAL | Status: DC

## 2014-10-06 ENCOUNTER — Encounter (HOSPITAL_COMMUNITY): Payer: Self-pay | Admitting: Interventional Cardiology

## 2014-10-10 ENCOUNTER — Ambulatory Visit (INDEPENDENT_AMBULATORY_CARE_PROVIDER_SITE_OTHER): Payer: Medicare Other | Admitting: Interventional Cardiology

## 2014-10-10 ENCOUNTER — Encounter: Payer: Self-pay | Admitting: Interventional Cardiology

## 2014-10-10 VITALS — BP 144/60 | HR 65 | Ht 63.0 in | Wt 191.4 lb

## 2014-10-10 DIAGNOSIS — I2510 Atherosclerotic heart disease of native coronary artery without angina pectoris: Secondary | ICD-10-CM

## 2014-10-10 DIAGNOSIS — I10 Essential (primary) hypertension: Secondary | ICD-10-CM

## 2014-10-10 DIAGNOSIS — I4891 Unspecified atrial fibrillation: Secondary | ICD-10-CM

## 2014-10-10 NOTE — Progress Notes (Signed)
Patient ID: Sheryl Curtis, female   DOB: 30-May-1932, 80 y.o.   MRN: [id number]    Sheryl Curtis, [place],   [address] Phone: [phone] Fax:  [phone]  Date:  10/10/2014   ID:  Sheryl Curtis, DOB 1931-12-15, MRN [id number]  PCP:  Donnie Coffin, MD      History of Present Illness: Sheryl Curtis is a 80 y.o. female who had an LAD stent placed in 2014. She had been having angina. She did not improve with medical therapy. She had an apical defect on stress test. She is better post DES to the mid LAD. Much less chest pain. No exertional sx. Only occasional episodes of chest pain.  Now using a wheelchair if possible due to hip pain and back pain if one is available.  She still has some fatigue with minimal activities. She describes fatigue with taking a shower, doing any work in the kitchen. She has not passed out. On efall with an ankle injury. She denies any palpitations. She has not had chest pain like she had prior to her stent. She just feels weak, and nervous.   Overall, no weight loss.  Mostly limited by arthritis. She checks her BP at home.  Typically in the 993 systolic range.    Wt Readings from Last 3 Encounters:  10/10/14 191 lb 6.4 oz (86.818 kg)  04/11/14 189 lb (85.73 kg)  04/02/14 188 lb (85.276 kg)     Past Medical History  Diagnosis Date  . Essential hypertension, benign   . Other and unspecified angina pectoris   . Atrial fibrillation   . ALLERGIC RHINITIS   . CANCER, COLORECTAL   . CHEST PAIN, ATYPICAL   . DYSPNEA   . FIBROMYALGIA   . G E R D   . INSOMNIA     Current Outpatient Prescriptions  Medication Sig Dispense Refill  . ALPRAZolam (XANAX) 0.25 MG tablet Take 0.25 mg by mouth as needed.  0  . Amlodipine-Valsartan-HCTZ 10-320-25 MG TABS Take 1 tablet by mouth daily in the morning for blood pressure 90 tablet 1  . budesonide-formoterol (SYMBICORT) 160-4.5 MCG/ACT inhaler Inhale 2 puffs into the lungs 2 (two) times daily. 1  Inhaler 0  . CALCIUM PO Take 1 tablet by mouth daily.    . clopidogrel (PLAVIX) 75 MG tablet Take 1 tablet (75 mg total) by mouth daily. 90 tablet 0  . DULoxetine (CYMBALTA) 30 MG capsule Take 60 mg by mouth daily.    . folic acid (FOLVITE) 1 MG tablet Take 1 mg by mouth daily.  1  . HYDROcodone-acetaminophen (NORCO/VICODIN) 5-325 MG per tablet as needed.  0  . methotrexate (RHEUMATREX) 2.5 MG tablet Take 2.5 mg by mouth once a week.  1  . Multiple Vitamin (MULTIVITAMIN WITH MINERALS) TABS Take 1 tablet by mouth daily.    . nitroGLYCERIN (NITROSTAT) 0.4 MG SL tablet Place 1 tablet (0.4 mg total) under the tongue every 5 (five) minutes as needed for chest pain. 90 tablet 3  . pantoprazole (PROTONIX) 40 MG tablet Take 1 tablet (40 mg total) by mouth daily. 90 tablet 0  . PRESCRIPTION MEDICATION Apply 1 drop to eye 2 (two) times daily.    Marland Kitchen SPIRIVA HANDIHALER 18 MCG inhalation capsule Use as directed    . traMADol (ULTRAM) 50 MG tablet Take 50 mg by mouth daily as needed.  2  . traZODone (DESYREL) 50 MG tablet Take 50 mg by mouth as needed.  12  No current facility-administered medications for this visit.    Allergies:    Allergies  Allergen Reactions  . Ace Inhibitors     REACTION: cough  . Aspirin     REACTION: hives  . Hydroxyquinolines Hives  . Imipramine Hcl   . Pamelor [Nortriptyline Hcl] Other (See Comments)  . Prednisone     itchy  . Procardia [Nifedipine] Other (See Comments)  . Plaquenil [Hydroxychloroquine Sulfate] Rash    Social History:  The patient  reports that she quit smoking about 51 years ago. Her smoking use included Cigarettes. She started smoking about 64 years ago. She has a 12 pack-year smoking history. She does not have any smokeless tobacco history on file. She reports that she does not drink alcohol or use illicit drugs.   Family History:  The patient's family history includes Bone cancer in her sister; CAD in her brother; Heart attack (age of onset: 86)  in her brother; Throat cancer in her daughter.   ROS:  Please see the history of present illness.  No nausea, vomiting.  No fevers, chills.  No focal weakness.  No dysuria.    All other systems reviewed and negative.   PHYSICAL EXAM: VS:  BP 144/60 mmHg  Pulse 65  Ht 5\' 3"  (1.6 m)  Wt 191 lb 6.4 oz (86.818 kg)  BMI 33.91 kg/m2 General: Well developed, well nourished, in no acute distress HEENT: normal Neck: no JVD, no carotid bruits Cardiac:  normal S1, S2; RRR;  Lungs:  clear to auscultation bilaterally, no wheezing, rhonchi or rales Abd: soft, nontender, no hepatomegaly Ext: no edema Skin: warm and dry Neuro:   no focal abnormalities noted Psych: normal affect  EKG:  NSR    ASSESSMENT AND PLAN:  Coronary atherosclerosis of native coronary artery  Stopped Imdur.  No increased anginal sx. Continue Plavix Tablet, 75 MG, 1 tablet, [place], Once a day, Notes: sending in a rx pt has been out Notes: Angina significantly better post PCI of LAD. Stop Imdur. Did not do cardiac rehab due to joint pains. She is interested. Allergic to aspirin. I asked her to look for another type of exercise that would help her be active. [facility] can [facility] aerobics.  No sx of AFib.  Fatigue: Feels that even small activities cause her to get tired and shaky and SHOB. No evidence of fluid overload by exam. Anxiety is also an issue. Checked echo to eval LV function in Jan 2015 and LVEF was normal. BNP < 50 in 6/14 and 8/14. Lab results reviewed. 4/15: normal pro BNP  Anxiety/ insomnia: She has issues with both. I think anxiety /depression likely played a part as well in the past.  Using trazodone.  She also has some medicine to use when she is nervous.   HTN: Better controlled than last year.  Signed, Mina Marble, MD, [facility] 10/10/2014 1:38 PM

## 2014-10-10 NOTE — Patient Instructions (Signed)
Your physician recommends that you continue on your current medications as directed. Please refer to the Current Medication list given to you today.  Your physician wants you to follow-up in: 1 year with Dr. Varanasi. You will receive a reminder letter in the mail two months in advance. If you don't receive a letter, please call our office to schedule the follow-up appointment.  

## 2014-11-24 ENCOUNTER — Telehealth: Payer: Self-pay | Admitting: Pulmonary Disease

## 2014-11-24 NOTE — Telephone Encounter (Signed)
Called daughter and appt scheduled to see TP Monday 11/28/14. Nothing further needed

## 2014-11-24 NOTE — Telephone Encounter (Signed)
Pl arrange FU OV with TP or me to assess need for O2

## 2014-11-24 NOTE — Telephone Encounter (Signed)
Spoke with [employer]) states that the patient is needing O2 set up.  Requesting O2 to use PRN during the daytime and at night. SOB and difficulty breathing is mostly at night. Daughter states that patient is gasping for air at night and often complains of a "smothering" feeling. Pt is using inhalers at directed but SOB seems to be worsening. Pt aware that specific testing will need to be done in order to qualify. Aware that I will send to Dr Elsworth Soho to advise on rec's.   Please advise Dr Elsworth Soho, thanks.

## 2014-11-28 ENCOUNTER — Ambulatory Visit: Payer: Self-pay | Admitting: Adult Health

## 2014-11-29 ENCOUNTER — Telehealth: Payer: Self-pay | Admitting: Interventional Cardiology

## 2014-11-29 NOTE — Telephone Encounter (Signed)
New Msg         Pt calling needs prior authorization for medication Clopidogrel 75mg   Please [place] at [phone]

## 2014-11-29 NOTE — Telephone Encounter (Signed)
Called [employer] at [phone]. Left  a message to call back regarding pt's prior authorization on Clopidogrel 75 mg once a day. Pt has been taking this medication since having a Drug-Eluting stent placement on 05/19/2013.

## 2014-11-29 NOTE — Telephone Encounter (Signed)
Stay states order for Clopidogrel 75 mg once a day was done.

## 2014-11-29 NOTE — Telephone Encounter (Signed)
Pt's clopidogrel 75 mg on time daily was preauthorized by [employer]  Refill 90 pills and 3 refills. Pt is aware.

## 2014-11-29 NOTE — Telephone Encounter (Signed)
F/U        [place] returning call. Please contact [place] at [phone].

## 2014-11-29 NOTE — Telephone Encounter (Signed)
Pt member ID [id number]

## 2014-12-01 ENCOUNTER — Encounter: Payer: Self-pay | Admitting: Adult Health

## 2014-12-01 ENCOUNTER — Ambulatory Visit (INDEPENDENT_AMBULATORY_CARE_PROVIDER_SITE_OTHER): Payer: PPO | Admitting: Adult Health

## 2014-12-01 ENCOUNTER — Telehealth: Payer: Self-pay | Admitting: Adult Health

## 2014-12-01 VITALS — BP 134/70 | HR 90 | Temp 98.1°F | Ht 64.0 in | Wt 190.8 lb

## 2014-12-01 DIAGNOSIS — R918 Other nonspecific abnormal finding of lung field: Secondary | ICD-10-CM

## 2014-12-01 DIAGNOSIS — R0602 Shortness of breath: Secondary | ICD-10-CM

## 2014-12-01 NOTE — Telephone Encounter (Signed)
Made in error

## 2014-12-01 NOTE — Patient Instructions (Signed)
Restart Symbicort 2 puffs Twice daily  , rinse after use.  Follow up Dr. Elsworth Soho  As planned next month with PFT  We are setting you up for CT chest to follow lung nodules -right before next appointment with Dr. Elsworth Soho

## 2014-12-01 NOTE — Assessment & Plan Note (Signed)
Dyspnea . Suspect is multifactorial , may have some underlying asthma +/- deconditioning  Have suggested that she restart her Symbicort and repeat PFTs on return Also look at PFTs as she is on methotrexate CT chest will also be done.  Plan Restart Symbicort 2 puffs Twice daily  , rinse after use.  Follow up Dr. Elsworth Soho  As planned next month with PFT  We are setting you up for CT chest to follow lung nodules -right before next appointment with Dr. Elsworth Soho

## 2014-12-01 NOTE — Assessment & Plan Note (Signed)
We are setting you up for CT chest to follow lung nodules -right before next appointment with Dr. Elsworth Soho  In March 2016

## 2014-12-01 NOTE — Progress Notes (Signed)
Subjective:    Patient ID: Sheryl Curtis, female    DOB: 22-Oct-1932, 81 y.o.   MRN: [id number]  HPI PCP- Donnie Coffin  81 year old remote smoker, presents for evaluation of incidental finding of pulmonary nodule on CT abdomen 04/11/14.   04/11/14 IOV  She is accompanied by her daughter Sheryl Curtis who has numerous questions. She reports epigastric pain ongoing for 3 weeks with loose stools for one to 2 days, prior history of rectal cancer status post resection in 2007,underwent CT abdomen that did not identify cause but showed bilateral noncalcified pulmonary nodules, largest measuring 7 mm in the posterior right lower lobe. This was not noted on a prior CT in 2003.  She also reports dyspnea on exertion for almost 2 years. She underwent stent to LAD in 04/2013. Echo has shown normal LV function without any evidence of diastolic dysfunction. Spirometry in 05/2010 showed FEV1 of 76% improvement postbronchodilator to 80%-1.60 with a ratio of 75 suggesting mild restriction. She was placed on Spiriva in 2011 by my partner, dr Lamonte Sakai, but she stopped this soon after since the did not help. She had been on Advair prior which was stopped. ACE inhibitor was stopped due to cough. Her daughter reports severe dyspnea, she has required a handicapped card, she expresses frustration that no one has identified cause of dyspnea. Spirometry today shows FEV1 of 1.25-64%, with ratio 65 and FVC of 1.91-72%. She did not desaturate after walking 2 laps in the office but was limited by shortness of breath. She quit smoking in 1964 and smoked about 20-pack-years prior. >>symbicort rx and CT chest follow up    12/01/2014 Acute OV  Patient returns for persistent shortness of breath, intermittent wheezing and dry cough. Patient was seen 8 months ago for pulmonary consultation for shortness of breath. . Spirometry showed FEV1 at 64% with a ratio of 65%.  Patient was started on Symbicort.  Patient admits that she did not take this  on a regular basis.  Patient had been found to have pulmonary nodules on CT . Follow-up chest CT in August showed scattered nodules all less than 8 mm.  She was recommended to have a follow-up CT in 6 months . She says over the last few years. Her shortness of breath has been getting worse. She Seems to begin a week. It wears out easily with walking. Last visit. She did not desaturate with ambulation. Patient is very unclear about what medication she is taking. She does have rheumatoid arthritis and is supposed to be on methotrexate weekly. She denies any hemoptysis, chest pain, orthopnea, PND, or increased leg swelling    Review of Systems Constitutional:   No  weight loss, night sweats,  Fevers, chills, +fatigue, or  lassitude.  HEENT:   No headaches,  Difficulty swallowing,  Tooth/dental problems, or  Sore throat,                No sneezing, itching, ear ache, nasal congestion, post nasal drip,   CV:  No chest pain,  Orthopnea, PND, swelling in lower extremities, anasarca, dizziness, palpitations, syncope.   GI  No heartburn, indigestion, abdominal pain, nausea, vomiting, diarrhea, change in bowel habits, loss of appetite, bloody stools.   Resp:   No chest wall deformity  Skin: no rash or lesions.  GU: no dysuria, change in color of urine, no urgency or frequency.  No flank pain, no hematuria   MS:  + joint pain    Psych:  No change in mood  or affect. No depression or anxiety.  No memory loss.    '    Objective:   Physical Exam GEN: A/Ox3; pleasant , NAD elderly   HEENT:  [place]/AT,  EACs-clear, TMs-wnl, NOSE-clear, THROAT-clear, no lesions, no postnasal drip or exudate noted.   NECK:  Supple w/ fair ROM; no JVD; normal carotid impulses w/o bruits; no thyromegaly or nodules palpated; no lymphadenopathy.  RESP  Decreased BS in bases no accessory muscle use, no dullness to percussion  CARD:  RRR, no m/r/g  , tr  peripheral edema, pulses intact, no cyanosis or clubbing.  GI:    Soft & nt; nml bowel sounds; no organomegaly or masses detected.  Musco: Warm bil, no deformities or joint swelling noted.   Neuro: alert, no focal deficits noted.    Skin: Warm, no lesions or rashes   CT chest 05/2014  Scattered pulmonary nodules. Most of these are 4 mm or less, although there is an 8 mm pulmonary nodule along the right hemidiaphragm on image 8 of series 603 (formerly measuring 7 mm 2 months ago). Fleischner followup guidelines did not apply due to the history of colon cancer. The largest lesion is at the borderline for reasonable negative predictive value by PET-CT. On the other hand, it is close to the hemidiaphragm, leading to motion artifact which can cause lesion obscuration on PET-CT. I would recommend a 6 month followup CT scan or PET-CT, as well as correlation with tumor markers.      Assessment & Plan:

## 2014-12-02 NOTE — Addendum Note (Signed)
Addended by: Oscar La R on: 12/02/2014 03:42 PM   Modules accepted: Medications

## 2014-12-05 NOTE — Progress Notes (Signed)
Reviewed & agree with plan  

## 2014-12-16 ENCOUNTER — Telehealth: Payer: Self-pay | Admitting: Interventional Cardiology

## 2014-12-16 NOTE — Telephone Encounter (Signed)
Spoke with pt in regards to palpitations. Pt stated that she "just didn't feel right" this morning. Pt stated she felt her pulse and it was "just real fast". Pt stated that she did not actually check her BP or count her pulse rate. Pt complained of an aching pain in her right arm and pain between her shoulders at time of palpitations. Pt stated that she couldn't say either way on whether or not she had shortness of breath during this episode. Pt states that this episode lasted 15-20 mins and then her heart rate slowed back down and she has felt better since. Spoke with DOD, Dr. Mare Ferrari and he said to have pt monitor her symptoms over the weekend and to call if symptoms reoccur. Informed pt of these instructions and she verbalized understanding and was in agreement with this plan. Pt asked about when to take Nitro.  Educated pt on when to take Nitro. Pt verbalized understanding and was very appreciative of call back and instructions.  Will forward to Dr. Irish Lack to make him aware.

## 2014-12-16 NOTE — Telephone Encounter (Signed)
New message      Patient c/o Palpitations:  High priority if patient c/o lightheadedness and shortness of breath.  1. How long have you been having palpitations? Started today around 8am 2. Are you currently experiencing lightheadedness and shortness of breath? no  3. Have you checked your BP and heart rate? (document readings) no  4. Are you experiencing any other symptoms? Should blades was hurting and a little sob this am

## 2015-01-06 ENCOUNTER — Ambulatory Visit (INDEPENDENT_AMBULATORY_CARE_PROVIDER_SITE_OTHER)
Admission: RE | Admit: 2015-01-06 | Discharge: 2015-01-06 | Disposition: A | Discharge status: Home / Self Care | Payer: PPO | Source: Ambulatory Visit | Attending: Pulmonary Disease | Admitting: Pulmonary Disease

## 2015-01-06 DIAGNOSIS — R918 Other nonspecific abnormal finding of lung field: Secondary | ICD-10-CM

## 2015-01-10 ENCOUNTER — Ambulatory Visit (INDEPENDENT_AMBULATORY_CARE_PROVIDER_SITE_OTHER): Payer: PPO | Admitting: Pulmonary Disease

## 2015-01-10 ENCOUNTER — Encounter: Payer: Self-pay | Admitting: Pulmonary Disease

## 2015-01-10 VITALS — BP 124/64 | HR 71 | Ht 61.5 in | Wt 188.0 lb

## 2015-01-10 DIAGNOSIS — R0602 Shortness of breath: Secondary | ICD-10-CM

## 2015-01-10 DIAGNOSIS — R918 Other nonspecific abnormal finding of lung field: Secondary | ICD-10-CM

## 2015-01-10 LAB — PULMONARY FUNCTION TEST
DL/VA % pred: 100 %
DL/VA: 4.48 ml/min/mmHg/L
DLCO unc % pred: 94 %
DLCO unc: 19.78 ml/min/mmHg
FEF 25-75 Post: 1.55 L/sec
FEF 25-75 Pre: 0.95 L/sec
FEF2575-%Change-Post: 63 %
FEF2575-%Pred-Post: 127 %
FEF2575-%Pred-Pre: 77 %
FEV1-%Change-Post: 16 %
FEV1-%Pred-Post: 98 %
FEV1-%Pred-Pre: 84 %
FEV1-Post: 1.64 L
FEV1-Pre: 1.41 L
FEV1FVC-%Change-Post: 0 %
FEV1FVC-%Pred-Pre: 96 %
FEV6-%Change-Post: 18 %
FEV6-%Pred-Post: 107 %
FEV6-%Pred-Pre: 90 %
FEV6-Post: 2.29 L
FEV6-Pre: 1.94 L
FEV6FVC-%Change-Post: 0 %
FEV6FVC-%Pred-Post: 105 %
FEV6FVC-%Pred-Pre: 105 %
FVC-%Change-Post: 17 %
FVC-%Pred-Post: 101 %
FVC-%Pred-Pre: 86 %
FVC-Post: 2.31 L
FVC-Pre: 1.97 L
Post FEV1/FVC ratio: 71 %
Post FEV6/FVC ratio: 99 %
Pre FEV1/FVC ratio: 71 %
Pre FEV6/FVC Ratio: 99 %
RV % pred: 116 %
RV: 2.65 L
TLC % pred: 104 %
TLC: 4.87 L

## 2015-01-10 MED ORDER — ALBUTEROL SULFATE 108 (90 BASE) MCG/ACT IN AEPB: 1.0000 | INHALATION_SPRAY | RESPIRATORY_TRACT | Status: DC | PRN

## 2015-01-10 NOTE — Progress Notes (Signed)
PFT done today. 

## 2015-01-10 NOTE — Assessment & Plan Note (Addendum)
Breathing test is OK - pos BD response More related to deconditioning , do not feel that we need to investigate her dyspnea further Use albuterol MDi 1-2 puffs as needed only

## 2015-01-10 NOTE — Patient Instructions (Addendum)
Nodules are stable FU CT in 1 yr Breathing test is OK Use albuterol MDi 1-2 puffs as needed only

## 2015-01-10 NOTE — Assessment & Plan Note (Signed)
Nodules are stable FU CT in 1 yr

## 2015-01-10 NOTE — Progress Notes (Signed)
   Subjective:    Patient ID: Sheryl Curtis, female    DOB: 12/31/1931, 81 y.o.   MRN: [id number]  HPI  PCP- Donnie Coffin  81 year old remote smoker with severe RA,for FU of pulmonary nodule   She  prior history of rectal cancer status post resection in 2007 She underwent stent to LAD in 04/2013. Echo has shown normal LV function without any evidence of diastolic dysfunction.  She was placed on Spiriva in 2011 by my partner, dr Lamonte Sakai, but she stopped this soon after since the did not help. She had been on Advair prior which was stopped. ACE inhibitor was stopped due to cough. Her daughter reports severe dyspnea, she has required a handicapped card, she expresses frustration that no one has identified cause of dyspnea.   She quit smoking in 1964 and smoked about 20-pack-years prior.   01/10/2015  Chief Complaint  Patient presents with  . Follow-up    PFT done today.  DOE sometimes hard to walk to [employer].  pain in hips, back, shoulders, fingers.  chest tightness.   She is accompanied by her daughter Danley Danker joint pains Dyspnea when walking Fall with left eye bruising -now healing PFTs - 12/2014 - no airway obstruction, ratio 71, pos BD response She has severe rheumatoid arthritis and takes methotrexate weekly She admits that her dyspnea may be related to her joint pains   Significant tests/ events  CT abdomen  03/2014 - showed bilateral noncalcified pulmonary nodules, largest measuring 7 mm in the posterior right lower lobe. This was not noted on a prior CT in 2003. chest CT 05/2014 showed scattered nodules all less than 8 mm. CT chest 01/06/15 - Stable appearance of scattered numerous bilateral pulmonary nodules measuring up to 8 mm  Spirometry in 05/2010 showed FEV1 of 76% improvement postbronchodilator to 80%-1.60 with a ratio of 75 suggesting mild restriction. Spirometry 03/2014 FEV1 of 1.25-64%, with ratio 65 and FVC of 1.91-72%.  Review of Systems neg for any significant  sore throat, dysphagia, itching, sneezing, nasal congestion or excess/ purulent secretions, fever, chills, sweats, unintended wt loss, pleuritic or exertional cp, hempoptysis, orthopnea pnd or change in chronic leg swelling. Also denies presyncope, palpitations, heartburn, abdominal pain, nausea, vomiting, diarrhea or change in bowel or urinary habits, dysuria,hematuria, rash, arthralgias, visual complaints, headache, numbness weakness or ataxia.     Objective:   Physical Exam  Gen. Pleasant, well-nourished, in no distress ENT - no lesions, no post nasal drip Neck: No JVD, no thyromegaly, no carotid bruits Lungs: no use of accessory muscles, no dullness to percussion, clear without rales or rhonchi  Cardiovascular: Rhythm regular, heart sounds  normal, no murmurs or gallops, no peripheral edema Musculoskeletal: No deformities, no cyanosis or clubbing        Assessment & Plan:

## 2015-01-26 NOTE — Progress Notes (Signed)
No need for repeat at this time.

## 2015-04-01 ENCOUNTER — Other Ambulatory Visit: Payer: Self-pay | Admitting: Cardiology

## 2015-04-01 MED ORDER — CLOPIDOGREL BISULFATE 75 MG PO TABS: 75.0000 mg | ORAL_TABLET | Freq: Every day | ORAL | Status: DC

## 2015-04-01 NOTE — Progress Notes (Signed)
  Patient called with medication needs. Plavix, 75 mg daily, e-prescribed to pharmacy. 90 day supply. 3 refills.  Bob Eastwood

## 2015-05-29 ENCOUNTER — Telehealth: Payer: Self-pay | Admitting: Interventional Cardiology

## 2015-05-29 NOTE — Telephone Encounter (Signed)
Pt scheduled with Flex, Richardson Dopp, PA-C on 8/2 at 10:30AM

## 2015-05-29 NOTE — Progress Notes (Signed)
Cardiology Office Note   Date:  05/30/2015   ID:  Sheryl Curtis, DOB [birth year], MRN [id number]  PCP:  Donnie Coffin, MD  Cardiologist:  Dr. Casandra Doffing   Electrophysiologist:  n/a  Chief Complaint  Patient presents with  . Chest Pain  . Shortness of Breath     History of Present Illness: Sheryl Curtis is a 81 y.o. female with a hx of aspirin allergy, CAD status post DES to the LAD in 2014, HTN, fibromyalgia, GERD, rheumatoid arthritis, COPD.    Last seen by Dr. Irish Lack 09/2014.  She presents today with her daughter with complaints of chest pain and SOB.  She has noted a lot of fatigue over the summer.  She feels weak just walking out to her mailbox.  She is NYHA 2b-3.  She typically gets CP at rest.  Denies exertional symptoms.  CP is sharp.  No assoc radiation, nausea, diaphoresis.  She denies syncope.  CP only lasts seconds. She has not used NTG.  She stopped using her inhalers several weeks ago. She has not noted a significant change in her symptoms with stopping these.   She has fallen 3 x this summer.  Lost her balance.  She has a nonproductive cough.  She does wheeze.  She has chronic hemorrhoidal bleeding.  No melena.  She does snore.  Her daughter notes that her snoring is fairly loud.  She admits to daytime hypersomnolence.     Studies/Reports Reviewed Today:  Echo 10/2013 Moderate LVH, EF 55-60%, normal wall motion, grade 1 diastolic dysfunction, trivial MR, mild LAE, normal RV function  LHC 04/28/13 LM: patent. LAD: proximal vessel is widely patent. There is a large first diagonal which is widely patent. D2 with mild ostial disease. Just past the origin of the second diagonal, there is a focal, eccentric plaque. In some views, it appears more severe  (75%) LCx: widely patent. RCA: widely patent.  EF: 60 %. LVEDP was 18 mmHg.  Myoview 7/14 Mild apical ischemia, EF 82%  PCI 05/19/13 Successful PCI to the mid left anterior descending artery with a 2.5 x 8 Promus  DES   Past Medical History  Diagnosis Date  . Essential hypertension, benign   . Other and unspecified angina pectoris   . Atrial fibrillation   . ALLERGIC RHINITIS   . CANCER, COLORECTAL   . CHEST PAIN, ATYPICAL   . DYSPNEA   . FIBROMYALGIA   . G E R D   . INSOMNIA     Past Surgical History  Procedure Laterality Date  . Back surgery    . Colon surgery    . Coronary stent placement    . Abdominal hysterectomy    . Breast surgery    . Percutaneous coronary stent intervention (pci-s) N/A 05/19/2013    Procedure: PERCUTANEOUS CORONARY STENT INTERVENTION (PCI-S);  Surgeon: Jettie Booze, MD;  Location: [facility] CATH LAB;  Service: Cardiovascular;  Laterality: N/A;     Current Outpatient Prescriptions  Medication Sig Dispense Refill  . ALPRAZolam (XANAX) 0.25 MG tablet Take 0.25 mg by mouth as needed.  0  . Amlodipine-Valsartan-HCTZ 10-320-25 MG TABS Take 1 tablet by mouth daily in the morning for blood pressure 90 tablet 1  . CALCIUM PO Take 1 tablet by mouth daily.    . clopidogrel (PLAVIX) 75 MG tablet Take 1 tablet (75 mg total) by mouth daily. 90 tablet 3  . DULoxetine (CYMBALTA) 30 MG capsule Take 60 mg by mouth daily.    Marland Kitchen  folic acid (FOLVITE) 1 MG tablet Take 1 mg by mouth daily.  1  . gabapentin (NEURONTIN) 100 MG capsule Take 100-300 mg by mouth at bedtime.    Marland Kitchen HYDROcodone-acetaminophen (NORCO/VICODIN) 5-325 MG per tablet Take 1 tablet by mouth daily as needed for moderate pain.   0  . isosorbide mononitrate (IMDUR) 30 MG 24 hr tablet Take 1 tablet (30 mg total) by mouth daily. 30 tablet 0  . Melatonin 10 MG TABS Take 10 mg by mouth at bedtime.    . methotrexate (RHEUMATREX) 2.5 MG tablet Take 2.5 mg by mouth once a week.  1  . Multiple Vitamin (MULTIVITAMIN WITH MINERALS) TABS Take 1 tablet by mouth daily.    . nitroGLYCERIN (NITROSTAT) 0.4 MG SL tablet Place 1 tablet (0.4 mg total) under the tongue every 5 (five) minutes as needed for chest pain. 90 tablet 3  .  PRESCRIPTION MEDICATION Apply 1 drop to eye 2 (two) times daily.    Marland Kitchen SPIRIVA HANDIHALER 18 MCG inhalation capsule Use as directed    . traZODone (DESYREL) 50 MG tablet Take 50 mg by mouth as needed.  12  . valsartan-hydrochlorothiazide (DIOVAN-HCT) 160-25 MG per tablet Take 1 tablet by mouth daily.    . Albuterol Sulfate (PROAIR RESPICLICK) 280 (90 BASE) MCG/ACT AEPB Inhale 1-2 puffs into the lungs as needed (for shortness of breath and/or wheezing.). (Patient not taking: Reported on 05/30/2015) 1 each 1  . budesonide-formoterol (SYMBICORT) 160-4.5 MCG/ACT inhaler Inhale 2 puffs into the lungs 2 (two) times daily. (Patient not taking: Reported on 05/30/2015) 1 Inhaler 0   No current facility-administered medications for this visit.    Allergies:   Ace inhibitors; Aspirin; Hydroxyquinolines; Imipramine hcl; Pamelor; Prednisone; Procardia; Tramadol; and Plaquenil    Social History:  The patient  reports that she quit smoking about 52 years ago. Her smoking use included Cigarettes. She started smoking about 65 years ago. She has a 12 pack-year smoking history. She does not have any smokeless tobacco history on file. She reports that she does not drink alcohol or use illicit drugs.   Family History:  The patient's family history includes Bone cancer in her sister; CAD in her brother; Heart attack (age of onset: 2) in her brother; Throat cancer in her daughter.    ROS:   Please see the history of present illness.   Review of Systems  Cardiovascular: Positive for chest pain and orthopnea.  Hematologic/Lymphatic: Bruises/bleeds easily.  All other systems reviewed and are negative.     PHYSICAL EXAM: VS:  BP 148/58 mmHg  Pulse 69  Ht 5\' 4"  (1.626 m)  Wt 192 lb 1.9 oz (87.145 kg)  BMI 32.96 kg/m2    Wt Readings from Last 3 Encounters:  05/30/15 192 lb 1.9 oz (87.145 kg)  01/10/15 188 lb (85.276 kg)  12/01/14 190 lb 12.8 oz (86.546 kg)     GEN: Well nourished, well developed, in no acute  distress HEENT: normal Neck: no JVD,  no masses Cardiac:  Normal S1/S2, RRR; no murmur ,  no rubs or gallops, no edema   Respiratory:  Decreased breath sounds bilaterally, no wheezing, rhonchi or rales. GI: soft, nontender, nondistended, + BS MS: no deformity or atrophy Skin: warm and dry  Neuro:  CNs II-XII intact, Strength and sensation are intact Psych: Normal affect   EKG:  EKG is ordered today.  It demonstrates:   NSR, HR 69, normal axis, no change from prior tracings   Recent Labs: No results  found for requested labs within last 365 days.    Lipid Panel No results found for: CHOL, TRIG, HDL, CHOLHDL, VLDL, LDLCALC, LDLDIRECT    ASSESSMENT AND PLAN:  1.  Chest Pain:  Symptoms are quite atypical.  She also notes SOB.  I suspect her dyspnea is multifactorial and related to COPD, obesity and deconditioning.  No signs of CHF.  She cannot really note a significant change in any of her symptoms after her PCI.  BP is somewhat elevated.  I will arrange a Lexiscan Myoview to rule out ischemia. Add Imdur 30 mg QD.  If Myoview low risk, suggest she FU with Pulmonology.  2.  CAD:  Proceed with Myoview.  Continue Plavix (ASA allergy), amlodipine.  Start Nitrates. I am not sure why she is not on a statin. I will review with Dr. Casandra Doffing. 3.  Snoring:  Arrange sleep study. 4.  Rheumatoid Arthritis:  Continue FU with Rheumatology.  Request most recent CBC, BMET.   5.  HTN:  Add Imdur.  Continue other Rx.      Medication Changes: Current medicines are reviewed at length with the patient today.  Concerns regarding medicines are as outlined above.  The following changes have been made:   Discontinued Medications   PANTOPRAZOLE (PROTONIX) 40 MG TABLET    Take 1 tablet (40 mg total) by mouth daily.   VALSARTAN-HYDROCHLOROTHIAZIDE (DIOVAN-HCT) 160-25 MG PER TABLET    TK 1 T PO QD   Modified Medications   No medications on file   New Prescriptions   ISOSORBIDE MONONITRATE (IMDUR) 30 MG  24 HR TABLET    Take 1 tablet (30 mg total) by mouth daily.    Labs/ tests ordered today include:   Orders Placed This Encounter  Procedures  . Myocardial Perfusion Imaging  . EKG 12-Lead  . Split night study     Disposition:   FU with Dr. Casandra Doffing 3-4 weeks.    Signed, Versie Starks, MHS 05/30/2015 9:07 PM    Sheryl Curtis [address], [place], [place]  [address] Phone: [phone]; Fax: [phone]

## 2015-05-29 NOTE — Telephone Encounter (Signed)
Pt c/o Shortness Of Breath: STAT if SOB developed within the last 24 hours or pt is noticeably SOB on the phone  1. Are you currently SOB (can you hear that pt is SOB on the phone)? Yes  2. How long have you been experiencing SOB? 3 or 4 days  3. Are you SOB when sitting or when up moving around? Doesn't know  4. Are you currently experiencing any other symptoms? Doesn't know    Pt's daughter calling for pt. Please call back and advise.

## 2015-05-29 NOTE — Telephone Encounter (Signed)
Spoke with pt about her SOB and chest discomfort.  She reports the SOB is really unchanged.  She does report and increase in fatigue and has had an increase in the frequency of her chest discomfort.  The discomfort she describes as a "keen or sharp pain" on the left side of her chest that is quick like lightening.  She reports it to be a 5 or 6 on a scale of 1 to 10.  There is no pattern and doesn't occur everyday.  It resolves on its own.  It has not even lasted long enough to use her SL Ntg. She denies any current pain or discomfort.  She would like an appt to be seen.  Will forward to nurse to see when she can be worked into the schedule.

## 2015-05-29 NOTE — Telephone Encounter (Signed)
Called back to speak with daughter about pt.  She was unable to answer questions.  Called pt and left message for her to call back to discuss SOB/chest discomfort.

## 2015-05-30 ENCOUNTER — Encounter: Payer: Self-pay | Admitting: Physician Assistant

## 2015-05-30 ENCOUNTER — Ambulatory Visit (INDEPENDENT_AMBULATORY_CARE_PROVIDER_SITE_OTHER): Payer: PPO | Admitting: Physician Assistant

## 2015-05-30 VITALS — BP 148/58 | HR 69 | Ht 64.0 in | Wt 192.1 lb

## 2015-05-30 DIAGNOSIS — I10 Essential (primary) hypertension: Secondary | ICD-10-CM

## 2015-05-30 DIAGNOSIS — R079 Chest pain, unspecified: Secondary | ICD-10-CM | POA: Diagnosis not present

## 2015-05-30 DIAGNOSIS — I2510 Atherosclerotic heart disease of native coronary artery without angina pectoris: Secondary | ICD-10-CM | POA: Diagnosis not present

## 2015-05-30 DIAGNOSIS — R0602 Shortness of breath: Secondary | ICD-10-CM

## 2015-05-30 DIAGNOSIS — R0683 Snoring: Secondary | ICD-10-CM | POA: Diagnosis not present

## 2015-05-30 MED ORDER — ISOSORBIDE MONONITRATE ER 30 MG PO TB24: 30.0000 mg | ORAL_TABLET | Freq: Every day | ORAL | Status: DC

## 2015-05-30 NOTE — Patient Instructions (Signed)
Medication Instructions:   Your physician has recommended you make the following change in your medication:   1.START IMDUR 30 MG TABLET BY MOUTH DAILY.    Labwork:  NONE  Testing/Procedures:  Your physician has requested that you have a lexiscan myoview. For further information please visit [email or web address]. Please follow instruction sheet, as given.  Your physician has recommended that you have a sleep study. This test records several body functions during sleep, including: brain activity, eye movement, oxygen and carbon dioxide blood levels, heart rate and rhythm, breathing rate and rhythm, the flow of air through your mouth and nose, snoring, body muscle movements, and chest and belly movement.    Follow-Up:  Your physician recommends that you schedule a follow-up appointment in: 3-4 WEEKS WITH DR. VARANASI   Any Other Special Instructions Will Be Listed Below (If Applicable).  NONE

## 2015-05-31 ENCOUNTER — Telehealth (HOSPITAL_COMMUNITY): Payer: Self-pay

## 2015-05-31 NOTE — Telephone Encounter (Signed)
Patient given detailed instructions per Myocardial Perfusion Study Information Sheet for test on 06-05-2015 at 0900. Patient Notified to arrive 15 minutes early, and that it is imperative to arrive on time for appointment to keep from having the test rescheduled. Patient verbalized understanding. Oletta Lamas, Anneth Brunell A

## 2015-06-01 ENCOUNTER — Other Ambulatory Visit: Payer: Self-pay | Admitting: Physician Assistant

## 2015-06-01 MED ORDER — ISOSORBIDE MONONITRATE ER 30 MG PO TB24: 30.0000 mg | ORAL_TABLET | Freq: Every day | ORAL | Status: DC

## 2015-06-05 ENCOUNTER — Ambulatory Visit (HOSPITAL_COMMUNITY): Payer: PPO | Attending: Cardiology

## 2015-06-05 DIAGNOSIS — I4891 Unspecified atrial fibrillation: Secondary | ICD-10-CM | POA: Diagnosis not present

## 2015-06-05 DIAGNOSIS — R079 Chest pain, unspecified: Secondary | ICD-10-CM | POA: Insufficient documentation

## 2015-06-05 DIAGNOSIS — I10 Essential (primary) hypertension: Secondary | ICD-10-CM | POA: Insufficient documentation

## 2015-06-05 DIAGNOSIS — R0609 Other forms of dyspnea: Secondary | ICD-10-CM | POA: Insufficient documentation

## 2015-06-05 DIAGNOSIS — I2510 Atherosclerotic heart disease of native coronary artery without angina pectoris: Secondary | ICD-10-CM | POA: Diagnosis not present

## 2015-06-05 MED ORDER — TECHNETIUM TC 99M SESTAMIBI GENERIC - CARDIOLITE
32.7000 | Freq: Once | INTRAVENOUS | Status: AC | PRN
  Administered 2015-06-05: 32.7 via INTRAVENOUS

## 2015-06-05 MED ORDER — REGADENOSON 0.4 MG/5ML IV SOLN
0.4000 mg | Freq: Once | INTRAVENOUS | Status: AC
  Administered 2015-06-05: 0.4 mg via INTRAVENOUS

## 2015-06-08 ENCOUNTER — Ambulatory Visit (HOSPITAL_COMMUNITY): Payer: PPO | Attending: Internal Medicine

## 2015-06-08 ENCOUNTER — Encounter: Payer: Self-pay | Admitting: Physician Assistant

## 2015-06-08 LAB — MYOCARDIAL PERFUSION IMAGING
LV dias vol: 69 mL
LV sys vol: 21 mL
Peak HR: 100 {beats}/min
RATE: 0.32
Rest HR: 70 {beats}/min
SDS: 10
SRS: 0
SSS: 10
TID: 1

## 2015-06-08 MED ORDER — TECHNETIUM TC 99M SESTAMIBI GENERIC - CARDIOLITE
32.4000 | Freq: Once | INTRAVENOUS | Status: AC | PRN
  Administered 2015-06-08: 32 via INTRAVENOUS

## 2015-06-09 ENCOUNTER — Telehealth: Payer: Self-pay | Admitting: *Deleted

## 2015-06-09 NOTE — Telephone Encounter (Signed)
Pt notified of myoview results by phone with verbal understanding 

## 2015-06-13 ENCOUNTER — Other Ambulatory Visit: Payer: Self-pay | Admitting: Physician Assistant

## 2015-07-01 ENCOUNTER — Inpatient Hospital Stay (HOSPITAL_COMMUNITY)
Admission: EM | Admit: 2015-07-01 | Discharge: 2015-07-06 | DRG: 871 | Disposition: A | Discharge status: Home Health | Payer: PPO | Attending: Internal Medicine | Admitting: Internal Medicine

## 2015-07-01 ENCOUNTER — Emergency Department (HOSPITAL_COMMUNITY): Payer: PPO

## 2015-07-01 ENCOUNTER — Encounter (HOSPITAL_COMMUNITY): Payer: Self-pay | Admitting: Emergency Medicine

## 2015-07-01 DIAGNOSIS — E872 Acidosis: Secondary | ICD-10-CM | POA: Diagnosis present

## 2015-07-01 DIAGNOSIS — G4700 Insomnia, unspecified: Secondary | ICD-10-CM | POA: Diagnosis present

## 2015-07-01 DIAGNOSIS — E669 Obesity, unspecified: Secondary | ICD-10-CM | POA: Diagnosis present

## 2015-07-01 DIAGNOSIS — I4891 Unspecified atrial fibrillation: Secondary | ICD-10-CM | POA: Diagnosis present

## 2015-07-01 DIAGNOSIS — I129 Hypertensive chronic kidney disease with stage 1 through stage 4 chronic kidney disease, or unspecified chronic kidney disease: Secondary | ICD-10-CM | POA: Diagnosis present

## 2015-07-01 DIAGNOSIS — E871 Hypo-osmolality and hyponatremia: Secondary | ICD-10-CM | POA: Diagnosis present

## 2015-07-01 DIAGNOSIS — Z6832 Body mass index (BMI) 32.0-32.9, adult: Secondary | ICD-10-CM

## 2015-07-01 DIAGNOSIS — T502X5A Adverse effect of carbonic-anhydrase inhibitors, benzothiadiazides and other diuretics, initial encounter: Secondary | ICD-10-CM | POA: Diagnosis present

## 2015-07-01 DIAGNOSIS — Z87891 Personal history of nicotine dependence: Secondary | ICD-10-CM

## 2015-07-01 DIAGNOSIS — G8929 Other chronic pain: Secondary | ICD-10-CM | POA: Diagnosis present

## 2015-07-01 DIAGNOSIS — Z886 Allergy status to analgesic agent status: Secondary | ICD-10-CM

## 2015-07-01 DIAGNOSIS — R7989 Other specified abnormal findings of blood chemistry: Secondary | ICD-10-CM

## 2015-07-01 DIAGNOSIS — Z888 Allergy status to other drugs, medicaments and biological substances status: Secondary | ICD-10-CM

## 2015-07-01 DIAGNOSIS — E860 Dehydration: Secondary | ICD-10-CM | POA: Diagnosis present

## 2015-07-01 DIAGNOSIS — Z85038 Personal history of other malignant neoplasm of large intestine: Secondary | ICD-10-CM | POA: Diagnosis not present

## 2015-07-01 DIAGNOSIS — R6521 Severe sepsis with septic shock: Secondary | ICD-10-CM | POA: Diagnosis present

## 2015-07-01 DIAGNOSIS — M797 Fibromyalgia: Secondary | ICD-10-CM | POA: Diagnosis present

## 2015-07-01 DIAGNOSIS — R748 Abnormal levels of other serum enzymes: Secondary | ICD-10-CM | POA: Diagnosis present

## 2015-07-01 DIAGNOSIS — R296 Repeated falls: Secondary | ICD-10-CM | POA: Diagnosis present

## 2015-07-01 DIAGNOSIS — I482 Chronic atrial fibrillation: Secondary | ICD-10-CM | POA: Diagnosis not present

## 2015-07-01 DIAGNOSIS — N390 Urinary tract infection, site not specified: Secondary | ICD-10-CM | POA: Diagnosis present

## 2015-07-01 DIAGNOSIS — R778 Other specified abnormalities of plasma proteins: Secondary | ICD-10-CM | POA: Diagnosis present

## 2015-07-01 DIAGNOSIS — I10 Essential (primary) hypertension: Secondary | ICD-10-CM | POA: Diagnosis present

## 2015-07-01 DIAGNOSIS — E876 Hypokalemia: Secondary | ICD-10-CM | POA: Diagnosis present

## 2015-07-01 DIAGNOSIS — M549 Dorsalgia, unspecified: Secondary | ICD-10-CM | POA: Diagnosis present

## 2015-07-01 DIAGNOSIS — R300 Dysuria: Secondary | ICD-10-CM | POA: Diagnosis present

## 2015-07-01 DIAGNOSIS — Z79899 Other long term (current) drug therapy: Secondary | ICD-10-CM | POA: Diagnosis not present

## 2015-07-01 DIAGNOSIS — Z955 Presence of coronary angioplasty implant and graft: Secondary | ICD-10-CM

## 2015-07-01 DIAGNOSIS — A419 Sepsis, unspecified organism: Secondary | ICD-10-CM | POA: Diagnosis present

## 2015-07-01 DIAGNOSIS — R197 Diarrhea, unspecified: Secondary | ICD-10-CM | POA: Diagnosis present

## 2015-07-01 DIAGNOSIS — I2510 Atherosclerotic heart disease of native coronary artery without angina pectoris: Secondary | ICD-10-CM | POA: Diagnosis present

## 2015-07-01 DIAGNOSIS — N183 Chronic kidney disease, stage 3 (moderate): Secondary | ICD-10-CM | POA: Diagnosis present

## 2015-07-01 DIAGNOSIS — IMO0001 Reserved for inherently not codable concepts without codable children: Secondary | ICD-10-CM | POA: Insufficient documentation

## 2015-07-01 DIAGNOSIS — I471 Supraventricular tachycardia: Secondary | ICD-10-CM

## 2015-07-01 DIAGNOSIS — N179 Acute kidney failure, unspecified: Secondary | ICD-10-CM | POA: Diagnosis present

## 2015-07-01 DIAGNOSIS — R079 Chest pain, unspecified: Secondary | ICD-10-CM | POA: Diagnosis present

## 2015-07-01 DIAGNOSIS — K219 Gastro-esophageal reflux disease without esophagitis: Secondary | ICD-10-CM | POA: Diagnosis present

## 2015-07-01 DIAGNOSIS — I5032 Chronic diastolic (congestive) heart failure: Secondary | ICD-10-CM | POA: Diagnosis present

## 2015-07-01 DIAGNOSIS — R000 Tachycardia, unspecified: Secondary | ICD-10-CM | POA: Diagnosis present

## 2015-07-01 LAB — CBC WITH DIFFERENTIAL/PLATELET
Basophils Absolute: 0 10*3/uL (ref 0.0–0.1)
Basophils Relative: 0 % (ref 0–1)
Eosinophils Absolute: 0 10*3/uL (ref 0.0–0.7)
Eosinophils Relative: 0 % (ref 0–5)
HCT: 37.4 % (ref 36.0–46.0)
Hemoglobin: 12.4 g/dL (ref 12.0–15.0)
Lymphocytes Relative: 3 % — ABNORMAL LOW (ref 12–46)
Lymphs Abs: 0.8 10*3/uL (ref 0.7–4.0)
MCH: 32.5 pg (ref 26.0–34.0)
MCHC: 33.2 g/dL (ref 30.0–36.0)
MCV: 98.2 fL (ref 78.0–100.0)
Monocytes Absolute: 1.8 10*3/uL — ABNORMAL HIGH (ref 0.1–1.0)
Monocytes Relative: 7 % (ref 3–12)
Neutro Abs: 23.5 10*3/uL — ABNORMAL HIGH (ref 1.7–7.7)
Neutrophils Relative %: 90 % — ABNORMAL HIGH (ref 43–77)
Platelets: 210 10*3/uL (ref 150–400)
RBC: 3.81 MIL/uL — ABNORMAL LOW (ref 3.87–5.11)
RDW: 14.6 % (ref 11.5–15.5)
WBC: 26.1 10*3/uL — ABNORMAL HIGH (ref 4.0–10.5)

## 2015-07-01 LAB — URINE MICROSCOPIC-ADD ON

## 2015-07-01 LAB — BRAIN NATRIURETIC PEPTIDE: B Natriuretic Peptide: 82 pg/mL (ref 0.0–100.0)

## 2015-07-01 LAB — URINALYSIS, ROUTINE W REFLEX MICROSCOPIC
Bilirubin Urine: NEGATIVE
Glucose, UA: NEGATIVE mg/dL
Ketones, ur: NEGATIVE mg/dL
Nitrite: POSITIVE — AB
Protein, ur: NEGATIVE mg/dL
Specific Gravity, Urine: 1.012 (ref 1.005–1.030)
Urobilinogen, UA: 0.2 mg/dL (ref 0.0–1.0)
pH: 6.5 (ref 5.0–8.0)

## 2015-07-01 LAB — COMPREHENSIVE METABOLIC PANEL
ALT: 20 U/L (ref 14–54)
AST: 27 U/L (ref 15–41)
Albumin: 3.1 g/dL — ABNORMAL LOW (ref 3.5–5.0)
Alkaline Phosphatase: 48 U/L (ref 38–126)
Anion gap: 12 (ref 5–15)
BUN: 29 mg/dL — ABNORMAL HIGH (ref 6–20)
CO2: 24 mmol/L (ref 22–32)
Calcium: 8.2 mg/dL — ABNORMAL LOW (ref 8.9–10.3)
Chloride: 91 mmol/L — ABNORMAL LOW (ref 101–111)
Creatinine, Ser: 1.61 mg/dL — ABNORMAL HIGH (ref 0.44–1.00)
GFR calc Af Amer: 33 mL/min — ABNORMAL LOW (ref >60)
GFR calc non Af Amer: 29 mL/min — ABNORMAL LOW (ref >60)
Glucose, Bld: 100 mg/dL — ABNORMAL HIGH (ref 65–99)
Potassium: 3.3 mmol/L — ABNORMAL LOW (ref 3.5–5.1)
Sodium: 127 mmol/L — ABNORMAL LOW (ref 135–145)
Total Bilirubin: 0.7 mg/dL (ref 0.3–1.2)
Total Protein: 6 g/dL — ABNORMAL LOW (ref 6.5–8.1)

## 2015-07-01 LAB — CBG MONITORING, ED: Glucose-Capillary: 113 mg/dL — ABNORMAL HIGH (ref 65–99)

## 2015-07-01 LAB — TSH: TSH: 3.292 u[IU]/mL (ref 0.350–4.500)

## 2015-07-01 LAB — TROPONIN I: Troponin I: 0.03 ng/mL (ref <0.031)

## 2015-07-01 MED ORDER — NITROGLYCERIN 2 % TD OINT
0.5000 [in_us] | TOPICAL_OINTMENT | Freq: Four times a day (QID) | TRANSDERMAL | Status: DC
  Administered 2015-07-02 (×2): 0.5 [in_us] via TOPICAL
  Filled 2015-07-01: qty 30

## 2015-07-01 MED ORDER — NITROGLYCERIN 0.4 MG SL SUBL: 0.4000 mg | SUBLINGUAL_TABLET | SUBLINGUAL | Status: DC | PRN

## 2015-07-01 MED ORDER — HEPARIN SODIUM (PORCINE) 5000 UNIT/ML IJ SOLN
4000.0000 [IU] | Freq: Once | INTRAMUSCULAR | Status: AC
  Administered 2015-07-01: 4000 [IU] via INTRAVENOUS
  Filled 2015-07-01: qty 1

## 2015-07-01 MED ORDER — HEPARIN (PORCINE) IN NACL 100-0.45 UNIT/ML-% IJ SOLN
1050.0000 [IU]/h | Freq: Once | INTRAMUSCULAR | Status: AC
  Administered 2015-07-01: 1050 [IU]/h via INTRAVENOUS
  Filled 2015-07-01: qty 250

## 2015-07-01 MED ORDER — PANTOPRAZOLE SODIUM 40 MG PO TBEC
40.0000 mg | DELAYED_RELEASE_TABLET | Freq: Every day | ORAL | Status: DC
  Administered 2015-07-02: 40 mg via ORAL

## 2015-07-01 MED ORDER — PIPERACILLIN-TAZOBACTAM 3.375 G IVPB 30 MIN
3.3750 g | Freq: Once | INTRAVENOUS | Status: AC
  Administered 2015-07-01: 3.375 g via INTRAVENOUS
  Filled 2015-07-01: qty 50

## 2015-07-01 MED ORDER — POTASSIUM CHLORIDE CRYS ER 20 MEQ PO TBCR
40.0000 meq | EXTENDED_RELEASE_TABLET | Freq: Once | ORAL | Status: AC
  Administered 2015-07-02: 40 meq via ORAL
  Filled 2015-07-01: qty 2

## 2015-07-01 MED ORDER — VANCOMYCIN HCL 10 G IV SOLR
1500.0000 mg | Freq: Once | INTRAVENOUS | Status: AC
  Administered 2015-07-02: 1500 mg via INTRAVENOUS
  Filled 2015-07-01 (×2): qty 1500

## 2015-07-01 MED ORDER — PANTOPRAZOLE SODIUM 40 MG PO TBEC: 40.0000 mg | DELAYED_RELEASE_TABLET | Freq: Every day | ORAL | Status: DC

## 2015-07-01 NOTE — Progress Notes (Deleted)
ANTICOAGULATION CONSULT NOTE - Initial Consult  Pharmacy Consult for heparin Indication: chest pain/ACS  Allergies  Allergen Reactions  . Aspirin Anaphylaxis, Hives and Shortness Of Breath  . Prednisone Shortness Of Breath and Itching    anxiety  . Ace Inhibitors Other (See Comments)    cough  . Hydroxyquinolines Hives  . Imipramine Hcl   . Pamelor [Nortriptyline Hcl] Other (See Comments)    unknown  . Procardia [Nifedipine] Other (See Comments)    Unknown   . Tramadol Swelling  . Plaquenil [Hydroxychloroquine Sulfate] Rash    Patient Measurements: Weight: 192 lb 3.9 oz (87.2 kg) Heparin Dosing Weight: 74kg  Vital Signs: Temp: 98.2 F (36.8 C) (09/03 1604) Temp Source: Oral (09/03 1604) BP: 142/52 mmHg (09/03 1900) Pulse Rate: 76 (09/03 1900)  Labs:  Recent Labs  07/01/15 1652  HGB 12.4  HCT 37.4  PLT 210  CREATININE 1.61*  TROPONINI 0.03    Estimated Creatinine Clearance: 29.3 mL/min (by C-G formula based on Cr of 1.61).   Medical History: Past Medical History  Diagnosis Date  . Essential hypertension, benign   . Other and unspecified angina pectoris   . Atrial fibrillation   . ALLERGIC RHINITIS   . CANCER, COLORECTAL   . CHEST PAIN, ATYPICAL   . DYSPNEA   . FIBROMYALGIA   . G E R D   . INSOMNIA   . History of stress test     Myoview 8/16:  EF 69%, anterior and apical defect c/w breast attenuation; Low Risk     Assessment: 34 YOF here with generalized weakness and malaise, became diaphoretic and had pain in between her shoulder blades.  Hgb 124, plts 210. She is not on anticoagulation PTA.  Goal of Therapy:  Heparin level 0.3-0.7 units/ml Monitor platelets by anticoagulation protocol: Yes   Plan:  -heparin bolus with 3000 units IV x1, then start infusion at 900 units/hr -HL in 8 hours -daily HL and CBC -follow for s/s bleeding and cardiology plans  Yago Ludvigsen D. Abbygael Curtiss, PharmD, BCPS Clinical Pharmacist Pager: [phone] 07/01/2015 7:26 PM

## 2015-07-01 NOTE — ED Notes (Signed)
Tried to get her to a bedside commode but she became faint

## 2015-07-01 NOTE — ED Notes (Signed)
Patient had a BM no urine. CBG 113

## 2015-07-01 NOTE — H&P (Signed)
[facility] Admission History and Physical       SHANIKWA STATE RDE:[id number] DOB: Jul 22, 1932 DOA: 07/01/2015  Referring physician: EDP PCP: Donnie Coffin, MD  Specialists:   Chief Complaint: Fevers and Chills and Chest Pain  HPI: Sheryl Curtis is a 81 y.o. female with a history of HTN, CAD S/P PCI with Stent, Atrial Fibrillation, and Fibromyalgia who presents to the ED with complaints of Fevers and Chills since the AM, and Chest pain  radiating between her shoulder blades.   She is undergoing a workup as an outpatient for chronic back pain and went for and MRI of the Lumbar spine this AM.   She has been on prednisone Rx for her back pain for the past 2 weeks, and has had malaise since.    She was evaluated in the ED and found to have a positive UA, and was referred for further evalaution and treatment and a cardiac rule out.      Review of Systems:  Constitutional: No Weight Loss, No Weight Gain, Night Sweats, Fevers, Chills, Dizziness, Light Headedness, Fatigue, or Generalized Weakness HEENT: No Headaches, Difficulty Swallowing,Tooth/Dental Problems,Sore Throat,  No Sneezing, Rhinitis, Ear Ache, Nasal Congestion, or Post Nasal Drip,  Cardio-vascular:  +Chest pain, Orthopnea, PND, Edema in Lower Extremities, Anasarca, Dizziness, Palpitations  Resp: No Dyspnea, No DOE, No Productive Cough, No Non-Productive Cough, No Hemoptysis, No Wheezing.    GI: No Heartburn, Indigestion, Abdominal Pain, Nausea, Vomiting, Diarrhea, Constipation, Hematemesis, Hematochezia, Melena, Change in Bowel Habits,  Loss of Appetite  GU: No Dysuria, No Change in Color of Urine, No Urgency or Urinary Frequency, No Flank pain.  Musculoskeletal: No Joint Pain or Swelling, No Decreased Range of Motion, +Back Pain.  Neurologic: No Syncope, No Seizures, Muscle Weakness, Paresthesia, Vision Disturbance or Loss, No Diplopia, No Vertigo, No Difficulty Walking,  Skin: No Rash or Lesions. Psych: No Change in Mood or  Affect, No Depression or Anxiety, No Memory loss, No Confusion, or Hallucinations   Past Medical History  Diagnosis Date  . Essential hypertension, benign   . Other and unspecified angina pectoris   . Atrial fibrillation   . ALLERGIC RHINITIS   . CANCER, COLORECTAL   . CHEST PAIN, ATYPICAL   . DYSPNEA   . FIBROMYALGIA   . G E R D   . INSOMNIA   . History of stress test     Myoview 8/16:  EF 69%, anterior and apical defect c/w breast attenuation; Low Risk      Past Surgical History  Procedure Laterality Date  . Back surgery    . Colon surgery    . Coronary stent placement    . Abdominal hysterectomy    . Breast surgery    . Percutaneous coronary stent intervention (pci-s) N/A 05/19/2013    Procedure: PERCUTANEOUS CORONARY STENT INTERVENTION (PCI-S);  Surgeon: Jettie Booze, MD;  Location[facility] CATH LAB;  Service: Cardiovascular;  Laterality: N/A;      Prior to Admission medications   Medication Sig Start Date End Date Taking? Authorizing Provider  Albuterol Sulfate (PROAIR RESPICLICK) 631 (90 BASE) MCG/ACT AEPB Inhale 1-2 puffs into the lungs as needed (for shortness of breath and/or wheezing.). 01/10/15  Yes Rigoberto Noel, MD  Ascorbic Acid (VITAMIN C) 1000 MG tablet Take 1,000 mg by mouth daily.   Yes Historical Provider, MD  Biotin (BIOTIN MAXIMUM STRENGTH) 5 MG CAPS Take 5 mg by mouth daily.   Yes Historical Provider, MD  CALCIUM PO Take 1,200  mg by mouth daily.    Yes Historical Provider, MD  DULoxetine (CYMBALTA) 60 MG capsule Take 60 mg by mouth daily. 05/16/15  Yes Historical Provider, MD  folic acid (FOLVITE) 1 MG tablet Take 1 mg by mouth daily. 10/05/14  Yes Historical Provider, MD  gabapentin (NEURONTIN) 100 MG capsule Take 300 mg by mouth at bedtime.    Yes Historical Provider, MD  isosorbide mononitrate (IMDUR) 30 MG 24 hr tablet Take 1 tablet (30 mg total) by mouth daily. 06/01/15  Yes Liliane Shi, PA-C  Melatonin 10 MG TABS Take 10 mg by mouth at bedtime.    Yes Historical Provider, MD  Multiple Vitamin (MULTIVITAMIN WITH MINERALS) TABS Take 1 tablet by mouth daily.   Yes Historical Provider, MD  pantoprazole (PROTONIX) 40 MG tablet Take 40 mg by mouth daily.   Yes Historical Provider, MD  PRESCRIPTION MEDICATION Apply 1 drop to eye 2 (two) times daily.   Yes Historical Provider, MD  valsartan-hydrochlorothiazide (DIOVAN-HCT) 160-25 MG per tablet Take 1 tablet by mouth daily.   Yes Historical Provider, MD  zinc gluconate 50 MG tablet Take 50 mg by mouth daily.   Yes Historical Provider, MD  Amlodipine-Valsartan-HCTZ [phone] MG TABS Take 1 tablet by mouth daily in the morning for blood pressure Patient not taking: Reported on 07/01/2015 09/02/14   Jettie Booze, MD  budesonide-formoterol [facility]) 160-4.5 MCG/ACT inhaler Inhale 2 puffs into the lungs 2 (two) times daily. Patient not taking: Reported on 05/30/2015 04/11/14   Rigoberto Noel, MD  clopidogrel (PLAVIX) 75 MG tablet Take 1 tablet (75 mg total) by mouth daily. Patient not taking: Reported on 07/01/2015 04/01/15   Brittainy M Rosita Fire, PA-C  methotrexate (RHEUMATREX) 2.5 MG tablet Take 20 mg by mouth once a week.  10/06/14   Historical Provider, MD  nitroGLYCERIN (NITROSTAT) 0.4 MG SL tablet Place 1 tablet (0.4 mg total) under the tongue every 5 (five) minutes as needed for chest pain. 07/30/13   Jettie Booze, MD  SPIRIVA HANDIHALER 18 MCG inhalation capsule Place 18 mcg into inhaler and inhale daily. Use as directed 02/02/14   Historical Provider, MD     Allergies  Allergen Reactions  . Aspirin Anaphylaxis, Hives and Shortness Of Breath  . Prednisone Shortness Of Breath and Itching    anxiety  . Ace Inhibitors Other (See Comments)    cough  . Hydroxyquinolines Hives  . Imipramine Hcl   . Pamelor [Nortriptyline Hcl] Other (See Comments)    unknown  . Procardia [Nifedipine] Other (See Comments)    Unknown   . Tramadol Swelling  . Plaquenil [Hydroxychloroquine Sulfate] Rash     Social History:  reports that she quit smoking about 52 years ago. Her smoking use included Cigarettes. She started smoking about 65 years ago. She has a 12 pack-year smoking history. She does not have any smokeless tobacco history on file. She reports that she does not drink alcohol or use illicit drugs.    Family History  Problem Relation Age of Onset  . CAD Brother   . Heart attack Brother 5  . Bone cancer Sister   . Throat cancer Daughter        Physical Exam:  GEN:  Pleasant Obese Elderly 81 y.o. Caucasian female examined and in no acute distress; cooperative with exam Filed Vitals:   07/01/15 2045 07/01/15 2115 07/01/15 2145 07/01/15 2200  BP: 140/33 135/46 143/41 136/41  Pulse: 73 70 70 75  Temp:  TempSrc:      Resp: 17 18 18 16   Weight:      SpO2: 94% 95% 94% 95%   Blood pressure 136/41, pulse 75, temperature 98.2 F (36.8 C), temperature source Oral, resp. rate 16, weight 87.2 kg (192 lb 3.9 oz), SpO2 95 %. PSYCH: She is alert and oriented x4; does not appear anxious does not appear depressed; affect is normal HEENT: Normocephalic and Atraumatic, Mucous membranes pink; PERRLA; EOM intact; Fundi:  Benign;  No scleral icterus, Nares: Patent, Oropharynx: Clear,Fair Dentition,    Neck:  FROM, No Cervical Lymphadenopathy nor Thyromegaly or Carotid Bruit; No JVD; Breasts:: Not examined CHEST WALL: No tenderness CHEST: Normal respiration, clear to auscultation bilaterally HEART: Regular rate and rhythm; no murmurs rubs or gallops BACK: No kyphosis or scoliosis; No CVA tenderness ABDOMEN: Positive Bowel Sounds, Obese, Soft Non-Tender, No Rebound or Guarding; No Masses, No Organomegaly Rectal Exam: Not done EXTREMITIES: No Cyanosis, Clubbing, or Edema; No Ulcerations. Genitalia: not examined PULSES: 2+ and symmetric SKIN: Normal hydration no rash or ulceration CNS:  Alert and Oriented x 4, No Focal Deficits Vascular: pulses palpable throughout    Labs on  Admission:  Basic Metabolic Panel:  Recent Labs Lab 07/01/15 1652  NA 127*  K 3.3*  CL 91*  CO2 24  GLUCOSE 100*  BUN 29*  CREATININE 1.61*  CALCIUM 8.2*   Liver Function Tests:  Recent Labs Lab 07/01/15 1652  AST 27  ALT 20  ALKPHOS 48  BILITOT 0.7  PROT 6.0*  ALBUMIN 3.1*   No results for input(s): LIPASE, AMYLASE in the last 168 hours. No results for input(s): AMMONIA in the last 168 hours. CBC:  Recent Labs Lab 07/01/15 1652  WBC 26.1*  NEUTROABS 23.5*  HGB 12.4  HCT 37.4  MCV 98.2  PLT 210   Cardiac Enzymes:  Recent Labs Lab 07/01/15 1652  TROPONINI 0.03    BNP (last 3 results)  Recent Labs  07/01/15 1653  BNP 82.0    ProBNP (last 3 results) No results for input(s): PROBNP in the last 8760 hours.  CBG:  Recent Labs Lab 07/01/15 1829  GLUCAP 113*    Radiological Exams on Admission: Dg Chest Portable 1 View  07/01/2015   CLINICAL DATA:  SOB, CP, baseline cough. Hx a-fib.  EXAM: PORTABLE CHEST - 1 VIEW  COMPARISON:  04/02/2014  FINDINGS: The heart size and mediastinal contours are within normal limits. Both lungs are clear. The visualized skeletal structures are unremarkable.  IMPRESSION: No active disease.   Electronically Signed   By: Nolon Nations M.D.   On: 07/01/2015 16:58     EKG: Independently reviewed. Sinus Tachycardia rate =100, Ischemic Changes in the Antero-Lateral Leads   Assessment/Plan:   81 y.o. female with  Principal Problem:   1.    Sepsis   Sepsis Workup   IV Vancomycin and Zosyn   Active Problems:   2.    UTI (lower urinary tract infection)   Urine Culture sent   Covered on #1     3.    Sinus tachycardia   IVFs   Cardiac Monitoring   Monitor Rate     4.    Chest pain   Cardiac Monitoring   Cycle Troponins   IV Heparin        5.    Coronary atherosclerosis of native coronary artery   Not taking Plavix   On Imdur, and      6.    Hyponatremia- due  to HCTZ   IVFs with NSS   Hold  HCTZ        7.    Hypokalemia- due to HCTZ   Replace K+   Check Magnesium     8.    AKI (acute kidney injury)   IVFs   Monitor BUN/Cr     9.     Essential hypertension   Continue Valsartan/HCTZ    10.    Atrial fibrillation   IV Heparin started    11.    Fibromyalgia syndrome   Pain Control PRN   Continue Gasapentin    12.    G E R D   Protonix     13.   DVT Prophylaxis   On IV Heparin    Code Status:     FULL CODE    Family Communication:   Family at Bedside     Disposition Plan:    Inpatient Status        Time spent:   [facility] Pager [phone]   If [place] Please Contact the Day Rounding Team MD for [facility]  If 7PM-7AM, Please Contact Night-Floor Coverage  [email or web address] Password [email or web address] 07/01/2015, 10:19 PM     ADDENDUM:   Patient was seen and examined on 07/01/2015

## 2015-07-01 NOTE — ED Notes (Signed)
Pt to ED via [facility] - pt from home, complains of feeling "overwhelmingly weak and ill", pt became diaphoretic and experienced pain in between her shoulder blades. pt has been feeling bad for approx. 5 weeks and had an MRI this morning, went home and began feeling like this. Pt reports a fall x3 days ago. Pt has fibromyalgia. Pt is a/o x4.

## 2015-07-01 NOTE — ED Provider Notes (Signed)
CSN: [id number]     Arrival date & time 07/01/15  1556 History   First MD Initiated Contact with Patient 07/01/15 1606     Chief Complaint  Patient presents with  . Chest Pain   Patient is a 81 y.o. female presenting with general illness. The history is provided by the patient. No language interpreter was used.  Illness Location:  Generalized Quality:  SOB, chills, fever, cough Severity:  Moderate Onset quality:  Gradual Timing:  Constant Progression:  Worsening Chronicity:  New Context:  HTN, CAD (stent), A-fib presenting with fever, chills, body aches, chest pain and shortness of breath. Recent MRI of L-spine for back pain. Patient notes cough at baseline but denies worsening recently. Associated symptoms: chest pain, cough, fever, myalgias and shortness of breath     Past Medical History  Diagnosis Date  . Essential hypertension, benign   . Other and unspecified angina pectoris   . Atrial fibrillation   . ALLERGIC RHINITIS   . CANCER, COLORECTAL   . CHEST PAIN, ATYPICAL   . DYSPNEA   . FIBROMYALGIA   . G E R D   . INSOMNIA   . History of stress test     Myoview 8/16:  EF 69%, anterior and apical defect c/w breast attenuation; Low Risk    Past Surgical History  Procedure Laterality Date  . Back surgery    . Colon surgery    . Coronary stent placement    . Abdominal hysterectomy    . Breast surgery    . Percutaneous coronary stent intervention (pci-s) N/A 05/19/2013    Procedure: PERCUTANEOUS CORONARY STENT INTERVENTION (PCI-S);  Surgeon: Jettie Booze, MD;  Location: [facility] CATH LAB;  Service: Cardiovascular;  Laterality: N/A;   Family History  Problem Relation Age of Onset  . CAD Brother   . Heart attack Brother 74  . Bone cancer Sister   . Throat cancer Daughter    Social History  Substance Use Topics  . Smoking status: Former Smoker -- 1.00 packs/day for 12 years    Types: Cigarettes    Start date: 10/28/1949    Quit date: 10/28/1962  . Smokeless tobacco:  None  . Alcohol Use: No   OB History    No data available      Review of Systems  Constitutional: Positive for fever.  Respiratory: Positive for cough and shortness of breath.   Cardiovascular: Positive for chest pain.  Musculoskeletal: Positive for myalgias.  All other systems reviewed and are negative.   Allergies  Aspirin; Prednisone; Ace inhibitors; Hydroxyquinolines; Imipramine hcl; Pamelor; Procardia; Tramadol; and Plaquenil  Home Medications   Prior to Admission medications   Medication Sig Start Date End Date Taking? Authorizing Provider  Albuterol Sulfate (PROAIR RESPICLICK) 580 (90 BASE) MCG/ACT AEPB Inhale 1-2 puffs into the lungs as needed (for shortness of breath and/or wheezing.). 01/10/15  Yes Rigoberto Noel, MD  Ascorbic Acid (VITAMIN C) 1000 MG tablet Take 1,000 mg by mouth daily.   Yes Historical Provider, MD  Biotin (BIOTIN MAXIMUM STRENGTH) 5 MG CAPS Take 5 mg by mouth daily.   Yes Historical Provider, MD  CALCIUM PO Take 1,200 mg by mouth daily.    Yes Historical Provider, MD  DULoxetine (CYMBALTA) 60 MG capsule Take 60 mg by mouth daily. 05/16/15  Yes Historical Provider, MD  folic acid (FOLVITE) 1 MG tablet Take 1 mg by mouth daily. 10/05/14  Yes Historical Provider, MD  gabapentin (NEURONTIN) 100 MG capsule Take 300 mg  by mouth at bedtime.    Yes Historical Provider, MD  isosorbide mononitrate (IMDUR) 30 MG 24 hr tablet Take 1 tablet (30 mg total) by mouth daily. 06/01/15  Yes Liliane Shi, PA-C  Melatonin 10 MG TABS Take 10 mg by mouth at bedtime.   Yes Historical Provider, MD  Multiple Vitamin (MULTIVITAMIN WITH MINERALS) TABS Take 1 tablet by mouth daily.   Yes Historical Provider, MD  pantoprazole (PROTONIX) 40 MG tablet Take 40 mg by mouth daily.   Yes Historical Provider, MD  PRESCRIPTION MEDICATION Apply 1 drop to eye 2 (two) times daily.   Yes Historical Provider, MD  valsartan-hydrochlorothiazide (DIOVAN-HCT) 160-25 MG per tablet Take 1 tablet by mouth  daily.   Yes Historical Provider, MD  zinc gluconate 50 MG tablet Take 50 mg by mouth daily.   Yes Historical Provider, MD  Amlodipine-Valsartan-HCTZ [phone] MG TABS Take 1 tablet by mouth daily in the morning for blood pressure Patient not taking: Reported on 07/01/2015 09/02/14   Jettie Booze, MD  budesonide-formoterol [facility]) 160-4.5 MCG/ACT inhaler Inhale 2 puffs into the lungs 2 (two) times daily. Patient not taking: Reported on 05/30/2015 04/11/14   Rigoberto Noel, MD  clopidogrel (PLAVIX) 75 MG tablet Take 1 tablet (75 mg total) by mouth daily. Patient not taking: Reported on 07/01/2015 04/01/15   Brittainy M Rosita Fire, PA-C  methotrexate (RHEUMATREX) 2.5 MG tablet Take 20 mg by mouth once a week.  10/06/14   Historical Provider, MD  nitroGLYCERIN (NITROSTAT) 0.4 MG SL tablet Place 1 tablet (0.4 mg total) under the tongue every 5 (five) minutes as needed for chest pain. 07/30/13   Jettie Booze, MD  SPIRIVA HANDIHALER 18 MCG inhalation capsule Place 18 mcg into inhaler and inhale daily. Use as directed 02/02/14   Historical Provider, MD   BP 142/36 mmHg  Pulse 73  Temp(Src) 98.9 F (37.2 C) (Oral)  Resp 18  Ht 5\' 4"  (1.626 m)  Wt 192 lb (87.091 kg)  BMI 32.94 kg/m2  SpO2 99%   Physical Exam  Constitutional: She is oriented to person, place, and time.  HENT:  Head: Normocephalic and atraumatic.  Eyes: Conjunctivae are normal. Pupils are equal, round, and reactive to light.  Neck: Normal range of motion. Neck supple.  Cardiovascular: Intact distal pulses.   HR 90s  Pulmonary/Chest: Effort normal and breath sounds normal. No respiratory distress. She has no wheezes.  Abdominal: Soft. Bowel sounds are normal. She exhibits no distension. There is no tenderness.  Musculoskeletal: Normal range of motion.  Neurological: She is alert and oriented to person, place, and time.  Skin: Skin is warm. She is diaphoretic.    ED Course  Procedures   Labs Review Labs Reviewed  CBC  WITH DIFFERENTIAL/PLATELET - Abnormal; Notable for the following:    WBC 26.1 (*)    RBC 3.81 (*)    Neutrophils Relative % 90 (*)    Lymphocytes Relative 3 (*)    Neutro Abs 23.5 (*)    Monocytes Absolute 1.8 (*)    All other components within normal limits  COMPREHENSIVE METABOLIC PANEL - Abnormal; Notable for the following:    Sodium 127 (*)    Potassium 3.3 (*)    Chloride 91 (*)    Glucose, Bld 100 (*)    BUN 29 (*)    Creatinine, Ser 1.61 (*)    Calcium 8.2 (*)    Total Protein 6.0 (*)    Albumin 3.1 (*)    GFR  calc non Af Amer 29 (*)    GFR calc Af Amer 33 (*)    All other components within normal limits  URINALYSIS, ROUTINE W REFLEX MICROSCOPIC (NOT AT [facility]) - Abnormal; Notable for the following:    APPearance CLOUDY (*)    Hgb urine dipstick TRACE (*)    Nitrite POSITIVE (*)    Leukocytes, UA SMALL (*)    All other components within normal limits  URINE MICROSCOPIC-ADD ON - Abnormal; Notable for the following:    Bacteria, UA MANY (*)    Casts HYALINE CASTS (*)    All other components within normal limits  CBG MONITORING, ED - Abnormal; Notable for the following:    Glucose-Capillary 113 (*)    All other components within normal limits  CULTURE, BLOOD (ROUTINE X 2)  CULTURE, BLOOD (ROUTINE X 2)  TROPONIN I  BRAIN NATRIURETIC PEPTIDE  TSH  HEPARIN LEVEL (UNFRACTIONATED)  CBC  MAGNESIUM  BASIC METABOLIC PANEL  TROPONIN I  TROPONIN I  TROPONIN I  LACTIC ACID, PLASMA  LACTIC ACID, PLASMA  PROCALCITONIN  PROTIME-INR  APTT   Imaging Review Dg Chest Portable 1 View  07/01/2015   CLINICAL DATA:  SOB, CP, baseline cough. Hx a-fib.  EXAM: PORTABLE CHEST - 1 VIEW  COMPARISON:  04/02/2014  FINDINGS: The heart size and mediastinal contours are within normal limits. Both lungs are clear. The visualized skeletal structures are unremarkable.  IMPRESSION: No active disease.   Electronically Signed   By: Nolon Nations M.D.   On: 07/01/2015 16:58   I have personally  reviewed and evaluated these images and lab results as part of my medical decision-making.   EKG Interpretation   Date/Time:  Saturday July 01 2015 16:01:03 EDT Ventricular Rate:  100 PR Interval:  131 QRS Duration: 75 QT Interval:  362 QTC Calculation: 467 R Axis:   67 Text Interpretation:  Sinus tachycardia Minimal ST depression,  anterolateral leads Baseline wander No significant change was found  Confirmed by [facility]  MD, TREY ([id number]) on 07/01/2015 5:35:58 PM      MDM  81 yo female with PMHx of HTN, CAD (stent), A-fib presenting with fever, chills, body aches, chest pain and shortness of breath. Recent MRI of L-spine for back pain. Patient notes cough at baseline but denies worsening recently. Denies rash.  Exam above notable for elderly female lying in bed. Afebrile. Heart rate in 90s. Normotensive. Breathing well on room air maintaining oxygen saturations without supplemental oxygen.  WBC 26.1. Blood and urine cultures drawn. Thick mycin and Zosyn ordered. IV fluids given. Chest x-ray showing no acute cardiopulmonary disease. UA positive for leukocytes, nitrate, and many bacteria.  Patient admitted to hospitalist service for further evaluation and management of urosepsis. Patient and patient's family understand and agree with plan and has no further questions or concerns at this time.  Patient care discussed with and followed by my attending, Dr. Serita Grit   Final diagnoses:  Sepsis, due to unspecified organism  Urinary tract infection without hematuria, site unspecified    Mayer Camel, MD 07/02/15 1751  Serita Grit, MD 07/02/15 1904

## 2015-07-01 NOTE — Progress Notes (Signed)
ANTICOAGULATION CONSULT NOTE - Initial Consult  Pharmacy Consult for heparin  Indication: chest pain/ACS  Allergies  Allergen Reactions  . Aspirin Anaphylaxis, Hives and Shortness Of Breath  . Prednisone Shortness Of Breath and Itching    anxiety  . Ace Inhibitors Other (See Comments)    cough  . Hydroxyquinolines Hives  . Imipramine Hcl   . Pamelor [Nortriptyline Hcl] Other (See Comments)    unknown  . Procardia [Nifedipine] Other (See Comments)    Unknown   . Tramadol Swelling  . Plaquenil [Hydroxychloroquine Sulfate] Rash    Patient Measurements: Weight: 192 lb 3.9 oz (87.2 kg) Heparin Dosing Weight: 74kg  Vital Signs: Temp: 98.2 F (36.8 C) (09/03 1604) Temp Source: Oral (09/03 1604) BP: 142/52 mmHg (09/03 1900) Pulse Rate: 76 (09/03 1900)  Labs:  Recent Labs  07/01/15 1652  HGB 12.4  HCT 37.4  PLT 210  CREATININE 1.61*  TROPONINI 0.03    Estimated Creatinine Clearance: 29.3 mL/min (by C-G formula based on Cr of 1.61).   Medical History: Past Medical History  Diagnosis Date  . Essential hypertension, benign   . Other and unspecified angina pectoris   . Atrial fibrillation   . ALLERGIC RHINITIS   . CANCER, COLORECTAL   . CHEST PAIN, ATYPICAL   . DYSPNEA   . FIBROMYALGIA   . G E R D   . INSOMNIA   . History of stress test     Myoview 8/16:  EF 69%, anterior and apical defect c/w breast attenuation; Low Risk     Medications:  Scheduled:  . heparin  1,050 Units/hr Intravenous Once  . heparin  4,000 Units Intravenous Once   Infusions:    Assessment: 81 yo who presented with chest pain. Heparin has been ordered to r/o MI. H/H looks ok. Some renal impairment.   Goal of Therapy:  Heparin level 0.3-0.7 units/ml Monitor platelets by anticoagulation protocol: Yes   Plan:   Heparin 4000 units/hr Heparin drip at 1050 units/hr Daily heparin level and CBC  Onnie Boer, PharmD Pager: [phone] 07/01/2015 7:26 PM

## 2015-07-01 NOTE — ED Provider Notes (Signed)
I saw and evaluated the patient, reviewed the resident's note and I agree with the findings and plan.   EKG Interpretation   Date/Time:  Saturday July 01 2015 16:01:03 EDT Ventricular Rate:  100 PR Interval:  131 QRS Duration: 75 QT Interval:  362 QTC Calculation: 467 R Axis:   67 Text Interpretation:  Sinus tachycardia Minimal ST depression,  anterolateral leads Baseline wander No significant change was found  Confirmed by [facility]  MD, TREY ([id number]) on 07/01/2015 5:35:63 PM      81 year old female presenting with generalized malaise and weakness. She has also had chills and sweats, subjective fevers.  On exam, ill appearing, but nontoxic, not distressed, normal respiratory effort, normal perfusion, heart sounds normal with regular rate and rhythm, lungs clear to auscultation bilaterally. UA shows UTI.  Treated empirically with abx.  Admitted.  Clinical Impression: 1. Sepsis, due to unspecified organism   2. Urinary tract infection without hematuria, site unspecified       Serita Grit, MD 07/02/15 1904

## 2015-07-02 DIAGNOSIS — R778 Other specified abnormalities of plasma proteins: Secondary | ICD-10-CM | POA: Diagnosis present

## 2015-07-02 DIAGNOSIS — IMO0001 Reserved for inherently not codable concepts without codable children: Secondary | ICD-10-CM | POA: Insufficient documentation

## 2015-07-02 DIAGNOSIS — R7989 Other specified abnormal findings of blood chemistry: Secondary | ICD-10-CM | POA: Diagnosis present

## 2015-07-02 LAB — LACTIC ACID, PLASMA
Lactic Acid, Venous: 3.6 mmol/L (ref 0.5–2.0)
Lactic Acid, Venous: 1.6 mmol/L (ref 0.5–2.0)

## 2015-07-02 LAB — PROCALCITONIN: Procalcitonin: 11.41 ng/mL

## 2015-07-02 LAB — CBC
HCT: 33.6 % — ABNORMAL LOW (ref 36.0–46.0)
Hemoglobin: 11.3 g/dL — ABNORMAL LOW (ref 12.0–15.0)
MCH: 33.6 pg (ref 26.0–34.0)
MCHC: 33.6 g/dL (ref 30.0–36.0)
MCV: 100 fL (ref 78.0–100.0)
Platelets: 182 10*3/uL (ref 150–400)
RBC: 3.36 MIL/uL — ABNORMAL LOW (ref 3.87–5.11)
RDW: 15 % (ref 11.5–15.5)
WBC: 47.5 10*3/uL — ABNORMAL HIGH (ref 4.0–10.5)

## 2015-07-02 LAB — PROTIME-INR
INR: 1.16 (ref 0.00–1.49)
Prothrombin Time: 15 seconds (ref 11.6–15.2)

## 2015-07-02 LAB — MAGNESIUM: Magnesium: 2.4 mg/dL (ref 1.7–2.4)

## 2015-07-02 LAB — BASIC METABOLIC PANEL
Anion gap: 9 (ref 5–15)
BUN: 29 mg/dL — ABNORMAL HIGH (ref 6–20)
CO2: 23 mmol/L (ref 22–32)
Calcium: 7.8 mg/dL — ABNORMAL LOW (ref 8.9–10.3)
Chloride: 96 mmol/L — ABNORMAL LOW (ref 101–111)
Creatinine, Ser: 1.89 mg/dL — ABNORMAL HIGH (ref 0.44–1.00)
GFR calc Af Amer: 28 mL/min — ABNORMAL LOW (ref >60)
GFR calc non Af Amer: 24 mL/min — ABNORMAL LOW (ref >60)
Glucose, Bld: 89 mg/dL (ref 65–99)
Potassium: 3.7 mmol/L (ref 3.5–5.1)
Sodium: 128 mmol/L — ABNORMAL LOW (ref 135–145)

## 2015-07-02 LAB — TROPONIN I
Troponin I: 0.05 ng/mL — ABNORMAL HIGH (ref <0.031)
Troponin I: 0.12 ng/mL — ABNORMAL HIGH (ref <0.031)
Troponin I: 0.1 ng/mL — ABNORMAL HIGH (ref <0.031)

## 2015-07-02 LAB — HEPARIN LEVEL (UNFRACTIONATED): Heparin Unfractionated: 0.73 IU/mL — ABNORMAL HIGH (ref 0.30–0.70)

## 2015-07-02 LAB — APTT: aPTT: 135 seconds — ABNORMAL HIGH (ref 24–37)

## 2015-07-02 MED ORDER — HEPARIN (PORCINE) IN NACL 100-0.45 UNIT/ML-% IJ SOLN: 950.0000 [IU]/h | INTRAMUSCULAR | Status: DC

## 2015-07-02 MED ORDER — NITROGLYCERIN 0.4 MG SL SUBL: 0.4000 mg | SUBLINGUAL_TABLET | SUBLINGUAL | Status: DC | PRN

## 2015-07-02 MED ORDER — FOLIC ACID 1 MG PO TABS
1.0000 mg | ORAL_TABLET | Freq: Every day | ORAL | Status: DC
  Administered 2015-07-02 – 2015-07-06 (×5): 1 mg via ORAL
  Filled 2015-07-02 (×5): qty 1

## 2015-07-02 MED ORDER — ALUM & MAG HYDROXIDE-SIMETH 200-200-20 MG/5ML PO SUSP: 30.0000 mL | Freq: Four times a day (QID) | ORAL | Status: DC | PRN

## 2015-07-02 MED ORDER — ADULT MULTIVITAMIN W/MINERALS CH
1.0000 | ORAL_TABLET | Freq: Every day | ORAL | Status: DC
  Administered 2015-07-02 – 2015-07-06 (×5): 1 via ORAL
  Filled 2015-07-02 (×5): qty 1

## 2015-07-02 MED ORDER — VITAMIN C 500 MG PO TABS
1000.0000 mg | ORAL_TABLET | Freq: Every day | ORAL | Status: DC
  Administered 2015-07-02 – 2015-07-06 (×5): 1000 mg via ORAL
  Filled 2015-07-02 (×5): qty 2

## 2015-07-02 MED ORDER — ZINC SULFATE 220 (50 ZN) MG PO CAPS
220.0000 mg | ORAL_CAPSULE | Freq: Every day | ORAL | Status: DC
  Administered 2015-07-02 – 2015-07-06 (×5): 220 mg via ORAL
  Filled 2015-07-02 (×5): qty 1

## 2015-07-02 MED ORDER — HEPARIN (PORCINE) IN NACL 100-0.45 UNIT/ML-% IJ SOLN: 1050.0000 [IU]/h | Freq: Once | INTRAMUSCULAR | Status: DC

## 2015-07-02 MED ORDER — PIPERACILLIN-TAZOBACTAM 3.375 G IVPB
3.3750 g | Freq: Three times a day (TID) | INTRAVENOUS | Status: DC
  Administered 2015-07-02 – 2015-07-05 (×10): 3.375 g via INTRAVENOUS
  Filled 2015-07-02 (×13): qty 50

## 2015-07-02 MED ORDER — TIOTROPIUM BROMIDE MONOHYDRATE 18 MCG IN CAPS
18.0000 ug | ORAL_CAPSULE | Freq: Every day | RESPIRATORY_TRACT | Status: DC
  Administered 2015-07-02 – 2015-07-06 (×5): 18 ug via RESPIRATORY_TRACT
  Filled 2015-07-02 (×3): qty 5

## 2015-07-02 MED ORDER — GABAPENTIN 300 MG PO CAPS
300.0000 mg | ORAL_CAPSULE | Freq: Every day | ORAL | Status: DC
  Administered 2015-07-02: 300 mg via ORAL
  Filled 2015-07-02: qty 1

## 2015-07-02 MED ORDER — MELATONIN 3 MG PO TABS
3.0000 mg | ORAL_TABLET | Freq: Every day | ORAL | Status: DC
  Administered 2015-07-02 – 2015-07-05 (×5): 3 mg via ORAL
  Filled 2015-07-02 (×9): qty 1

## 2015-07-02 MED ORDER — HYDROMORPHONE HCL 1 MG/ML IJ SOLN
0.5000 mg | INTRAMUSCULAR | Status: DC | PRN
  Administered 2015-07-02 – 2015-07-03 (×3): 1 mg via INTRAVENOUS
  Filled 2015-07-02 (×3): qty 1

## 2015-07-02 MED ORDER — SODIUM CHLORIDE 0.9 % IJ SOLN
3.0000 mL | Freq: Two times a day (BID) | INTRAMUSCULAR | Status: DC
  Administered 2015-07-03: 3 mL via INTRAVENOUS
  Administered 2015-07-04 – 2015-07-05 (×2): 10 mL via INTRAVENOUS

## 2015-07-02 MED ORDER — ISOSORBIDE MONONITRATE ER 30 MG PO TB24
30.0000 mg | ORAL_TABLET | Freq: Every day | ORAL | Status: DC
  Administered 2015-07-02 – 2015-07-06 (×5): 30 mg via ORAL
  Filled 2015-07-02 (×5): qty 1

## 2015-07-02 MED ORDER — ONDANSETRON HCL 4 MG/2ML IJ SOLN: 4.0000 mg | Freq: Four times a day (QID) | INTRAMUSCULAR | Status: DC | PRN

## 2015-07-02 MED ORDER — OXYCODONE HCL 5 MG PO TABS
5.0000 mg | ORAL_TABLET | ORAL | Status: DC | PRN
  Administered 2015-07-02 – 2015-07-03 (×2): 5 mg via ORAL
  Filled 2015-07-02 (×2): qty 1

## 2015-07-02 MED ORDER — SODIUM CHLORIDE 0.9 % IV SOLN
INTRAVENOUS | Status: DC
  Administered 2015-07-02 – 2015-07-05 (×2): via INTRAVENOUS

## 2015-07-02 MED ORDER — METHOTREXATE 2.5 MG PO TABS
20.0000 mg | ORAL_TABLET | ORAL | Status: DC
  Administered 2015-07-05: 20 mg via ORAL
  Filled 2015-07-02: qty 8

## 2015-07-02 MED ORDER — ACETAMINOPHEN 325 MG PO TABS
650.0000 mg | ORAL_TABLET | Freq: Four times a day (QID) | ORAL | Status: DC | PRN
  Administered 2015-07-03: 650 mg via ORAL
  Filled 2015-07-02: qty 2

## 2015-07-02 MED ORDER — ENOXAPARIN SODIUM 40 MG/0.4ML ~~LOC~~ SOLN: 40.0000 mg | SUBCUTANEOUS | Status: DC

## 2015-07-02 MED ORDER — ENOXAPARIN SODIUM 30 MG/0.3ML ~~LOC~~ SOLN
30.0000 mg | SUBCUTANEOUS | Status: DC
  Administered 2015-07-02 – 2015-07-04 (×3): 30 mg via SUBCUTANEOUS
  Filled 2015-07-02 (×3): qty 0.3

## 2015-07-02 MED ORDER — ONDANSETRON HCL 4 MG PO TABS: 4.0000 mg | ORAL_TABLET | Freq: Four times a day (QID) | ORAL | Status: DC | PRN

## 2015-07-02 MED ORDER — VANCOMYCIN HCL IN DEXTROSE 1-5 GM/200ML-% IV SOLN: 1000.0000 mg | INTRAVENOUS | Status: DC

## 2015-07-02 MED ORDER — DULOXETINE HCL 60 MG PO CPEP
60.0000 mg | ORAL_CAPSULE | Freq: Every day | ORAL | Status: DC
Start: 2015-07-02 — End: 2015-07-06
  Administered 2015-07-02 – 2015-07-06 (×5): 60 mg via ORAL
  Filled 2015-07-02 (×5): qty 1

## 2015-07-02 MED ORDER — ACETAMINOPHEN 650 MG RE SUPP: 650.0000 mg | Freq: Four times a day (QID) | RECTAL | Status: DC | PRN

## 2015-07-02 MED ORDER — PANTOPRAZOLE SODIUM 40 MG PO TBEC
40.0000 mg | DELAYED_RELEASE_TABLET | Freq: Every day | ORAL | Status: DC
  Administered 2015-07-02 – 2015-07-06 (×5): 40 mg via ORAL
  Filled 2015-07-02 (×6): qty 1

## 2015-07-02 MED ORDER — CALCIUM CARBONATE 1250 (500 CA) MG PO TABS
1250.0000 mg | ORAL_TABLET | Freq: Every day | ORAL | Status: DC
  Administered 2015-07-02 – 2015-07-06 (×5): 1250 mg via ORAL
  Filled 2015-07-02 (×6): qty 3

## 2015-07-02 NOTE — Progress Notes (Signed)
Dr. Breck Coons paged due to patient c/o's of baldder spasms and feeling like she needs to urinate every few minutes.Requesting something for bladder spasms. [facility] [employer]

## 2015-07-02 NOTE — Progress Notes (Signed)
[facility] PROGRESS NOTE  Sheryl Curtis YTK:[id number] DOB: 08-Aug-1932 DOA: 07/01/2015 PCP: Donnie Coffin, MD  Assessment/Plan:  Principal Problem:   Sepsis secondary to UTI: Hemodynamically stable and lactate improving. Continue Zosyn. Discontinue vancomycin. I don't see that a urine culture was collected. will order. Active Problems:   Essential hypertension   Atrial fibrillation   G E R D   Coronary atherosclerosis of native coronary artery   UTI (lower urinary tract infection)   Sinus tachycardia   Chest pain   Fibromyalgia syndrome   Hyponatremia   Hypokalemia corrected.   AKI (acute kidney injury)/CKD 3  elevated troponin: Recently had a negative Cardiolite. Likely related to sepsis. Doubt acute coronary syndrome. Will check echocardiogram and no further workup if no significant wall motion abnormality or change in EF.  PT evaluation.  Code Status:  full Family Communication:  Daughter at bedside Disposition Plan:  Home once improved, likely several days.   HPI/Subjective: Fatigued. Had some dysuria prior to admission. Daughter reports that she has been sleepy and without energy for several weeks now.  Objective: Filed Vitals:   07/02/15 0526  BP: 141/45  Pulse: 99  Temp: 98.1 F (36.7 C)  Resp: 16    Intake/Output Summary (Last 24 hours) at 07/02/15 1231 Last data filed at 07/02/15 1002  Gross per 24 hour  Intake 1100.03 ml  Output   1100 ml  Net   0.03 ml   Filed Weights   07/01/15 1900 07/01/15 2345  Weight: 87.2 kg (192 lb 3.9 oz) 87.091 kg (192 lb)    Exam:   General:  Asleep. Obese. Arousable. Answers questions and follows commands  Cardiovascular: Regular rate rhythm without murmurs gallops rubs  Respiratory: Clear to auscultation bilaterally without wheezes rhonchi or rales  Abdomen: Soft nontender nondistended  Ext: No clubbing cyanosis or edema  Basic Metabolic Panel:  Recent Labs Lab 07/01/15 1652 07/02/15 0001  07/02/15 0740  NA 127*  --  128*  K 3.3*  --  3.7  CL 91*  --  96*  CO2 24  --  23  GLUCOSE 100*  --  89  BUN 29*  --  29*  CREATININE 1.61*  --  1.89*  CALCIUM 8.2*  --  7.8*  MG  --  2.4  --    Liver Function Tests:  Recent Labs Lab 07/01/15 1652  AST 27  ALT 20  ALKPHOS 48  BILITOT 0.7  PROT 6.0*  ALBUMIN 3.1*   No results for input(s): LIPASE, AMYLASE in the last 168 hours. No results for input(s): AMMONIA in the last 168 hours. CBC:  Recent Labs Lab 07/01/15 1652 07/02/15 0740  WBC 26.1* 47.5*  NEUTROABS 23.5*  --   HGB 12.4 11.3*  HCT 37.4 33.6*  MCV 98.2 100.0  PLT 210 182   Cardiac Enzymes:  Recent Labs Lab 07/01/15 1652 07/02/15 0100 07/02/15 0845  TROPONINI 0.03 0.05* 0.12*   BNP (last 3 results)  Recent Labs  07/01/15 1653  BNP 82.0    ProBNP (last 3 results) No results for input(s): PROBNP in the last 8760 hours.  CBG:  Recent Labs Lab 07/01/15 1829  GLUCAP 113*    No results found for this or any previous visit (from the past 240 hour(s)).   Studies: Dg Chest Portable 1 View  07/01/2015   CLINICAL DATA:  SOB, CP, baseline cough. Hx a-fib.  EXAM: PORTABLE CHEST - 1 VIEW  COMPARISON:  04/02/2014  FINDINGS: The heart size and  mediastinal contours are within normal limits. Both lungs are clear. The visualized skeletal structures are unremarkable.  IMPRESSION: No active disease.   Electronically Signed   By: Nolon Nations M.D.   On: 07/01/2015 16:58    Scheduled Meds: . calcium carbonate  1,250 mg Oral Q breakfast  . DULoxetine  60 mg Oral Daily  . folic acid  1 mg Oral Daily  . gabapentin  300 mg Oral QHS  . Melatonin  3 mg Oral QHS  . [START ON 07/05/2015] methotrexate  20 mg Oral Q Wed  . multivitamin with minerals  1 tablet Oral Daily  . nitroGLYCERIN  0.5 inch Topical 4 times per day  . pantoprazole  40 mg Oral Daily  . piperacillin-tazobactam (ZOSYN)  IV  3.375 g Intravenous 3 times per day  . sodium chloride  3 mL  Intravenous Q12H  . [START ON 07/04/2015] vancomycin  1,000 mg Intravenous Q48H  . vitamin C  1,000 mg Oral Daily  . zinc sulfate  220 mg Oral Daily   Continuous Infusions: . sodium chloride 75 mL/hr at 07/02/15 0128  . heparin 950 Units/hr (07/02/15 1030)    Time spent: 35 minutes  [facility] [email or web address], password [facility] 07/02/2015, 12:31 PM  LOS: 1 day

## 2015-07-02 NOTE — Progress Notes (Signed)
Dr. Breck Coons was paged , no return call as of yet. Dr. Arnoldo Morale paged to make aware of lactic acid just drawn and question if IV fluid bolus wanted. Also to report diastolic pressure 36 and patient noted to be low in ER. Patient is alert and oriented, afebrile and in no s/s acute distress at this time. [facility] [employer]

## 2015-07-02 NOTE — Progress Notes (Addendum)
ANTICOAGULATION CONSULT NOTE - Follow up [place] for heparin  Indication: chest pain/ACS  Allergies  Allergen Reactions  . Aspirin Anaphylaxis, Hives and Shortness Of Breath  . Prednisone Shortness Of Breath and Itching    anxiety  . Ace Inhibitors Other (See Comments)    cough  . Hydroxyquinolines Hives  . Imipramine Hcl   . Pamelor [Nortriptyline Hcl] Other (See Comments)    unknown  . Procardia [Nifedipine] Other (See Comments)    Unknown   . Tramadol Swelling  . Plaquenil [Hydroxychloroquine Sulfate] Rash    Patient Measurements: Height: 5\' 4"  (162.6 cm) Weight: 192 lb (87.091 kg) IBW/kg (Calculated) : 54.7 Heparin Dosing Weight: 74kg  Vital Signs: Temp: 98.1 F (36.7 C) (09/04 0526) Temp Source: Oral (09/04 0526) BP: 141/45 mmHg (09/04 0526) Pulse Rate: 99 (09/04 0526)  Labs:  Recent Labs  07/01/15 1652 07/02/15 0100 07/02/15 0740  HGB 12.4  --  11.3*  HCT 37.4  --  33.6*  PLT 210  --  182  APTT  --  135*  --   LABPROT  --  15.0  --   INR  --  1.16  --   HEPARINUNFRC  --   --  0.73*  CREATININE 1.61*  --  1.89*  TROPONINI 0.03 0.05*  --     Estimated Creatinine Clearance: 24.9 mL/min (by C-G formula based on Cr of 1.89).   Medical History: Past Medical History  Diagnosis Date  . Essential hypertension, benign   . Other and unspecified angina pectoris   . Atrial fibrillation   . ALLERGIC RHINITIS   . CANCER, COLORECTAL   . CHEST PAIN, ATYPICAL   . DYSPNEA   . FIBROMYALGIA   . G E R D   . INSOMNIA   . History of stress test     Myoview 8/16:  EF 69%, anterior and apical defect c/w breast attenuation; Low Risk     Medications:  Scheduled:  . calcium carbonate  1,250 mg Oral Q breakfast  . DULoxetine  60 mg Oral Daily  . folic acid  1 mg Oral Daily  . gabapentin  300 mg Oral QHS  . Melatonin  3 mg Oral QHS  . [START ON 07/05/2015] methotrexate  20 mg Oral Q Wed  . multivitamin with minerals  1 tablet Oral Daily  .  nitroGLYCERIN  0.5 inch Topical 4 times per day  . pantoprazole  40 mg Oral Daily  . piperacillin-tazobactam (ZOSYN)  IV  3.375 g Intravenous 3 times per day  . sodium chloride  3 mL Intravenous Q12H  . [START ON 07/04/2015] vancomycin  1,000 mg Intravenous Q48H  . vitamin C  1,000 mg Oral Daily  . zinc sulfate  220 mg Oral Daily   Infusions:  . sodium chloride 75 mL/hr at 07/02/15 0128  . heparin      Assessment: 81 yo who presented with chest pain. Heparin has been ordered to r/o MI.  History of atrial fibrillation but not on PTA anticoags. HL this morning 0.73 slightly above goal range. No reported bleeding.  Goal of Therapy:  Heparin level 0.3-0.7 units/ml Monitor platelets by anticoagulation protocol: Yes   Plan:   Heparin drip decreased to 950 units/hr. Recheck HL at 8hrs. Daily heparin level and CBC  Stephens November, PharmD PGY1 Resident Pager: [phone] 07/02/2015 9:19 AM

## 2015-07-02 NOTE — Progress Notes (Signed)
CRITICAL VALUE ALERT  Critical value received: lactic acid 3.6  Date of notification: 07/02/15  Time of notification:  0150  Critical value read back: yes  Nurse who received alert:  Zachary George rn  MD notified (1st page): [place]  Time of first page:  0153  MD notified (2nd page):  Time of second page:  Responding MD: [place]  Time MD responded:  0200

## 2015-07-02 NOTE — Progress Notes (Signed)
ANTIBIOTIC CONSULT NOTE - INITIAL  Pharmacy Consult for Vancomycin and Zosyn  Indication: rule out sepsis  Allergies  Allergen Reactions  . Aspirin Anaphylaxis, Hives and Shortness Of Breath  . Prednisone Shortness Of Breath and Itching    anxiety  . Ace Inhibitors Other (See Comments)    cough  . Hydroxyquinolines Hives  . Imipramine Hcl   . Pamelor [Nortriptyline Hcl] Other (See Comments)    unknown  . Procardia [Nifedipine] Other (See Comments)    Unknown   . Tramadol Swelling  . Plaquenil [Hydroxychloroquine Sulfate] Rash    Patient Measurements: Height: 5\' 4"  (162.6 cm) Weight: 192 lb (87.091 kg) IBW/kg (Calculated) : 54.7 Adjusted Body Weight: 70 kg   Vital Signs: Temp: 98.9 F (37.2 C) (09/03 2345) Temp Source: Oral (09/03 2345) BP: 142/36 mmHg (09/03 2345) Pulse Rate: 73 (09/03 2345) Intake/Output from previous day: 09/03 0701 - 09/04 0700 In: -  Out: 500 [Urine:200; Stool:300] Intake/Output from this shift: Total I/O In: -  Out: 300 [Stool:300]  Labs:  Recent Labs  07/01/15 1652  WBC 26.1*  HGB 12.4  PLT 210  CREATININE 1.61*   Estimated Creatinine Clearance: 29.3 mL/min (by C-G formula based on Cr of 1.61). No results for input(s): VANCOTROUGH, VANCOPEAK, VANCORANDOM, GENTTROUGH, GENTPEAK, GENTRANDOM, TOBRATROUGH, TOBRAPEAK, TOBRARND, AMIKACINPEAK, AMIKACINTROU, AMIKACIN in the last 72 hours.   Microbiology: No results found for this or any previous visit (from the past 720 hour(s)).  Medical History: Past Medical History  Diagnosis Date  . Essential hypertension, benign   . Other and unspecified angina pectoris   . Atrial fibrillation   . ALLERGIC RHINITIS   . CANCER, COLORECTAL   . CHEST PAIN, ATYPICAL   . DYSPNEA   . FIBROMYALGIA   . G E R D   . INSOMNIA   . History of stress test     Myoview 8/16:  EF 69%, anterior and apical defect c/w breast attenuation; Low Risk     Medications:  Prescriptions prior to admission   Medication Sig Dispense Refill Last Dose  . Albuterol Sulfate (PROAIR RESPICLICK) 381 (90 BASE) MCG/ACT AEPB Inhale 1-2 puffs into the lungs as needed (for shortness of breath and/or wheezing.). 1 each 1 07/01/2015 at Unknown time  . Ascorbic Acid (VITAMIN C) 1000 MG tablet Take 1,000 mg by mouth daily.   07/01/2015 at Unknown time  . Biotin (BIOTIN MAXIMUM STRENGTH) 5 MG CAPS Take 5 mg by mouth daily.   07/01/2015 at Unknown time  . CALCIUM PO Take 1,200 mg by mouth daily.    07/01/2015 at Unknown time  . DULoxetine (CYMBALTA) 60 MG capsule Take 60 mg by mouth daily.  0 07/01/2015 at Unknown time  . folic acid (FOLVITE) 1 MG tablet Take 1 mg by mouth daily.  1 07/01/2015 at Unknown time  . gabapentin (NEURONTIN) 100 MG capsule Take 300 mg by mouth at bedtime.    06/30/2015 at Unknown time  . isosorbide mononitrate (IMDUR) 30 MG 24 hr tablet Take 1 tablet (30 mg total) by mouth daily. 30 tablet 11 07/01/2015 at Unknown time  . Melatonin 10 MG TABS Take 10 mg by mouth at bedtime.   06/30/2015 at Unknown time  . Multiple Vitamin (MULTIVITAMIN WITH MINERALS) TABS Take 1 tablet by mouth daily.   07/01/2015 at Unknown time  . pantoprazole (PROTONIX) 40 MG tablet Take 40 mg by mouth daily.   07/01/2015 at Unknown time  . PRESCRIPTION MEDICATION Apply 1 drop to eye 2 (two) times  daily.   06/30/2015 at Unknown time  . valsartan-hydrochlorothiazide (DIOVAN-HCT) 160-25 MG per tablet Take 1 tablet by mouth daily.   07/01/2015 at Unknown time  . zinc gluconate 50 MG tablet Take 50 mg by mouth daily.   07/01/2015 at Unknown time  . Amlodipine-Valsartan-HCTZ 10-320-25 MG TABS Take 1 tablet by mouth daily in the morning for blood pressure (Patient not taking: Reported on 07/01/2015) 90 tablet 1 Not Taking at Unknown time  . budesonide-formoterol (SYMBICORT) 160-4.5 MCG/ACT inhaler Inhale 2 puffs into the lungs 2 (two) times daily. (Patient not taking: Reported on 05/30/2015) 1 Inhaler 0 Not Taking at Unknown time  . clopidogrel (PLAVIX) 75 MG  tablet Take 1 tablet (75 mg total) by mouth daily. (Patient not taking: Reported on 07/01/2015) 90 tablet 3 Not Taking at Unknown time  . methotrexate (RHEUMATREX) 2.5 MG tablet Take 20 mg by mouth once a week.   1 06/28/2015  . nitroGLYCERIN (NITROSTAT) 0.4 MG SL tablet Place 1 tablet (0.4 mg total) under the tongue every 5 (five) minutes as needed for chest pain. 90 tablet 3 Unknown  . SPIRIVA HANDIHALER 18 MCG inhalation capsule Place 18 mcg into inhaler and inhale daily. Use as directed   Not Taking at Unknown time   Assessment: 81 y.o. female with possible urosepsis for empiric antibiotics  Goal of Therapy:  Vancomycin trough level 15-20 mcg/ml  Plan:  Vancomycin 1500 mg IV now as ordered in ED, then 1 g IV q48h Zosyn 3.375 g IV q8h    Caryl Pina 07/02/2015,12:10 AM

## 2015-07-02 NOTE — Progress Notes (Signed)
Rapid response made aware of lactic acid of 3.6. [facility] [employer]

## 2015-07-03 ENCOUNTER — Inpatient Hospital Stay (HOSPITAL_COMMUNITY): Payer: PPO

## 2015-07-03 DIAGNOSIS — I5032 Chronic diastolic (congestive) heart failure: Secondary | ICD-10-CM

## 2015-07-03 DIAGNOSIS — R079 Chest pain, unspecified: Secondary | ICD-10-CM

## 2015-07-03 LAB — CBC
HCT: 29.5 % — ABNORMAL LOW (ref 36.0–46.0)
Hemoglobin: 9.7 g/dL — ABNORMAL LOW (ref 12.0–15.0)
MCH: 33.3 pg (ref 26.0–34.0)
MCHC: 32.9 g/dL (ref 30.0–36.0)
MCV: 101.4 fL — ABNORMAL HIGH (ref 78.0–100.0)
Platelets: 132 10*3/uL — ABNORMAL LOW (ref 150–400)
RBC: 2.91 MIL/uL — ABNORMAL LOW (ref 3.87–5.11)
RDW: 15 % (ref 11.5–15.5)
WBC: 24.5 10*3/uL — ABNORMAL HIGH (ref 4.0–10.5)

## 2015-07-03 LAB — C DIFFICILE QUICK SCREEN W PCR REFLEX
C Diff antigen: NEGATIVE
C Diff interpretation: NEGATIVE
C Diff toxin: NEGATIVE

## 2015-07-03 LAB — URINE CULTURE: Culture: NO GROWTH

## 2015-07-03 LAB — BASIC METABOLIC PANEL
Anion gap: 7 (ref 5–15)
BUN: 28 mg/dL — ABNORMAL HIGH (ref 6–20)
CO2: 24 mmol/L (ref 22–32)
Calcium: 7.6 mg/dL — ABNORMAL LOW (ref 8.9–10.3)
Chloride: 99 mmol/L — ABNORMAL LOW (ref 101–111)
Creatinine, Ser: 1.78 mg/dL — ABNORMAL HIGH (ref 0.44–1.00)
GFR calc Af Amer: 30 mL/min — ABNORMAL LOW (ref >60)
GFR calc non Af Amer: 26 mL/min — ABNORMAL LOW (ref >60)
Glucose, Bld: 98 mg/dL (ref 65–99)
Potassium: 4.3 mmol/L (ref 3.5–5.1)
Sodium: 130 mmol/L — ABNORMAL LOW (ref 135–145)

## 2015-07-03 LAB — TROPONIN I: Troponin I: 0.19 ng/mL — ABNORMAL HIGH (ref <0.031)

## 2015-07-03 MED ORDER — LORAZEPAM 0.5 MG PO TABS
0.5000 mg | ORAL_TABLET | Freq: Once | ORAL | Status: AC
Start: 2015-07-03 — End: 2015-07-03
  Administered 2015-07-03: 0.5 mg via ORAL
  Filled 2015-07-03: qty 1

## 2015-07-03 MED ORDER — LOPERAMIDE HCL 2 MG PO CAPS
2.0000 mg | ORAL_CAPSULE | ORAL | Status: DC | PRN
  Administered 2015-07-03 – 2015-07-04 (×3): 2 mg via ORAL
  Filled 2015-07-03 (×3): qty 1

## 2015-07-03 NOTE — Progress Notes (Signed)
PROGRESS NOTE  Sheryl Curtis:[id number] DOB: Mar 21, 1932 DOA: 07/01/2015 PCP: Donnie Coffin, MD  HPI/Recap of past 24 hours: 81 yo female w/hx of CAD s/p stent, A-fib, admitted on 9/3 w/1 day hx of fevers/chills & chest pain within shoulder blades.  Pt found to have UTI, acute on chronic renal failure & initial lactic acid was 3.6.  Patient treated as septic shock from UTI, started on IV Zosyn. Started having episodes of diarrhea, C. difficile negative. Patient's white blood cell count peaked to as high as 47.5 on 9/4 and down to 24.5 today. Troponin has been slightly trending upward and currently at 0.19. lactic acid level by 9/4 normalized. Patient self doing okay. Overall feels better, but still having large amount of diarrhea. Started on Imodium. No abdominal pain.  Assessment/Plan: Principal Problem:   Sepsis secondary UTI: Patient meets criteria given lactic acidosis, marked leukocytosis, hypotension and tachycardia: Stabilized. Awaiting urine cultures. Continue IV Zosyn. Overall improved. Active Problems:   Essential hypertension   Atrial fibrillation: With diastolic heart failure, patient's chads 2 score now at 5. We'll discuss with cardiology about anticoagulation. Heart rate now controlled.  Diarrhea: C. difficile negative. May be secondary to antibiotics. When necessary Imodium.    G E R D   Coronary atherosclerosis of native coronary artery    Sinus tachycardia   Chest pain: No strong evidence of ACS. Pain has since resolved.   Fibromyalgia syndrome   Hyponatremia: Secondary dehydration, improving with IV fluids   marketed leukocytosis: Prior to admission, although not listed in her medicine reconciliation, patient had been treated for 2 weeks on prednisone for her back pain which likely is a contributor factor on top of her infection. Hopefully should see some improvement and make sure this continues to trend down    Hypokalemia: Secondary dehydration, replacing   AKI  (acute kidney injury): Improving with IV fluids   Elevated troponin: Minimal elevation. Do not think that this is acute coronary syndrome. Echocardiogram notes mild diastolic dysfunction. Suspect likely in the setting of atrial fibrillation.   Blood poisoning:   Code Status: Full code  Family Communication: left msg with family  Disposition Plan: Clinically improving.  Possible discharge in few days once diarrhea, leukocytosis resolves,    Consultants:  None  Procedures:  Echocardiogram done 9/5: Diastolic dysfunction  Antibiotics:  Zosyn 9/3-present   Objective: BP 119/35 mmHg  Pulse 73  Temp(Src) 97.5 F (36.4 C) (Oral)  Resp 17  Ht 5\' 4"  (1.626 m)  Wt 87.091 kg (192 lb)  BMI 32.94 kg/m2  SpO2 97%  Intake/Output Summary (Last 24 hours) at 07/03/15 1500 Last data filed at 07/03/15 1035  Gross per 24 hour  Intake 3171.25 ml  Output   2050 ml  Net 1121.25 ml   Filed Weights   07/01/15 1900 07/01/15 2345  Weight: 87.2 kg (192 lb 3.9 oz) 87.091 kg (192 lb)    Exam:   General:  Alert and oriented 3, no acute distress  Cardiovascular: Irregular rhythm, rate controlled  Respiratory: Clear to auscultation bilaterally  Abdomen: Soft, obese, nontender, positive bowel sounds  Musculoskeletal: No clubbing or cyanosis or edema   Data Reviewed: Basic Metabolic Panel:  Recent Labs Lab 07/01/15 1652 07/02/15 0001 07/02/15 0740 07/03/15 0518  NA 127*  --  128* 130*  K 3.3*  --  3.7 4.3  CL 91*  --  96* 99*  CO2 24  --  23 24  GLUCOSE 100*  --  89 98  BUN 29*  --  29* 28*  CREATININE 1.61*  --  1.89* 1.78*  CALCIUM 8.2*  --  7.8* 7.6*  MG  --  2.4  --   --    Liver Function Tests:  Recent Labs Lab 07/01/15 1652  AST 27  ALT 20  ALKPHOS 48  BILITOT 0.7  PROT 6.0*  ALBUMIN 3.1*   No results for input(s): LIPASE, AMYLASE in the last 168 hours. No results for input(s): AMMONIA in the last 168 hours. CBC:  Recent Labs Lab 07/01/15 1652  07/02/15 0740 07/03/15 1212  WBC 26.1* 47.5* 24.5*  NEUTROABS 23.5*  --   --   HGB 12.4 11.3* 9.7*  HCT 37.4 33.6* 29.5*  MCV 98.2 100.0 101.4*  PLT 210 182 132*   Cardiac Enzymes:    Recent Labs Lab 07/01/15 1652 07/02/15 0100 07/02/15 0845 07/02/15 1550 07/03/15 1212  TROPONINI 0.03 0.05* 0.12* 0.10* 0.19*   BNP (last 3 results)  Recent Labs  07/01/15 1653  BNP 82.0    ProBNP (last 3 results) No results for input(s): PROBNP in the last 8760 hours.  CBG:  Recent Labs Lab 07/01/15 1829  GLUCAP 113*    Recent Results (from the past 240 hour(s))  Blood culture (routine x 2)     Status: None (Preliminary result)   Collection Time: 07/01/15  5:55 PM  Result Value Ref Range Status   Specimen Description BLOOD LEFT ARM  Final   Special Requests BOTTLES DRAWN AEROBIC AND ANAEROBIC 10CC  Final   Culture NO GROWTH < 24 HOURS  Final   Report Status PENDING  Incomplete  Blood culture (routine x 2)     Status: None (Preliminary result)   Collection Time: 07/01/15  6:00 PM  Result Value Ref Range Status   Specimen Description BLOOD LEFT HAND  Final   Special Requests BOTTLES DRAWN AEROBIC ONLY 5CC  Final   Culture NO GROWTH < 24 HOURS  Final   Report Status PENDING  Incomplete  Urine culture     Status: None   Collection Time: 07/02/15  3:38 PM  Result Value Ref Range Status   Specimen Description URINE, CLEAN CATCH  Final   Special Requests NONE  Final   Culture NO GROWTH 1 DAY  Final   Report Status 07/03/2015 FINAL  Final  C difficile quick scan w PCR reflex     Status: None   Collection Time: 07/03/15 12:36 AM  Result Value Ref Range Status   C Diff antigen NEGATIVE NEGATIVE Final   C Diff toxin NEGATIVE NEGATIVE Final   C Diff interpretation Negative for toxigenic C. difficile  Final     Studies: No results found.  Scheduled Meds: . calcium carbonate  1,250 mg Oral Q breakfast  . DULoxetine  60 mg Oral Daily  . enoxaparin (LOVENOX) injection  30 mg  Subcutaneous Q24H  . folic acid  1 mg Oral Daily  . isosorbide mononitrate  30 mg Oral Daily  . Melatonin  3 mg Oral QHS  . [START ON 07/05/2015] methotrexate  20 mg Oral Q Wed  . multivitamin with minerals  1 tablet Oral Daily  . pantoprazole  40 mg Oral Daily  . piperacillin-tazobactam (ZOSYN)  IV  3.375 g Intravenous 3 times per day  . sodium chloride  3 mL Intravenous Q12H  . tiotropium  18 mcg Inhalation Daily  . vitamin C  1,000 mg Oral Daily  . zinc sulfate  220 mg Oral Daily  Continuous Infusions: . sodium chloride 75 mL/hr at 07/02/15 0128     Time spent: 25 minutes  [facility] Pager [phone]. If 7PM-7AM, please contact night-coverage at [email or web address], password [facility] 07/03/2015, 3:00 PM  LOS: 2 days

## 2015-07-03 NOTE — Progress Notes (Signed)
Echocardiogram 2D Echocardiogram has been performed.  Sheryl Curtis 07/03/2015, 12:45 PM

## 2015-07-04 LAB — CBC
HCT: 27.7 % — ABNORMAL LOW (ref 36.0–46.0)
Hemoglobin: 9.2 g/dL — ABNORMAL LOW (ref 12.0–15.0)
MCH: 32.6 pg (ref 26.0–34.0)
MCHC: 33.2 g/dL (ref 30.0–36.0)
MCV: 98.2 fL (ref 78.0–100.0)
Platelets: 127 10*3/uL — ABNORMAL LOW (ref 150–400)
RBC: 2.82 MIL/uL — ABNORMAL LOW (ref 3.87–5.11)
RDW: 14.7 % (ref 11.5–15.5)
WBC: 11.2 10*3/uL — ABNORMAL HIGH (ref 4.0–10.5)

## 2015-07-04 LAB — BASIC METABOLIC PANEL
Anion gap: 6 (ref 5–15)
BUN: 16 mg/dL (ref 6–20)
CO2: 25 mmol/L (ref 22–32)
Calcium: 8 mg/dL — ABNORMAL LOW (ref 8.9–10.3)
Chloride: 101 mmol/L (ref 101–111)
Creatinine, Ser: 1.43 mg/dL — ABNORMAL HIGH (ref 0.44–1.00)
GFR calc Af Amer: 39 mL/min — ABNORMAL LOW (ref >60)
GFR calc non Af Amer: 33 mL/min — ABNORMAL LOW (ref >60)
Glucose, Bld: 93 mg/dL (ref 65–99)
Potassium: 3.8 mmol/L (ref 3.5–5.1)
Sodium: 132 mmol/L — ABNORMAL LOW (ref 135–145)

## 2015-07-04 LAB — TROPONIN I: Troponin I: 0.04 ng/mL — ABNORMAL HIGH (ref <0.031)

## 2015-07-04 MED ORDER — ENOXAPARIN SODIUM 40 MG/0.4ML ~~LOC~~ SOLN
40.0000 mg | SUBCUTANEOUS | Status: DC
  Administered 2015-07-05: 40 mg via SUBCUTANEOUS
  Filled 2015-07-04: qty 0.4

## 2015-07-04 NOTE — Care Management Note (Signed)
Case Management Note  Patient Details  Name: Sheryl Curtis MRN: [id number] Date of Birth: December 29, 1931  Subjective/Objective:       Patient lives alone, she states her daughter will be coming to stay with her when she goes home.  Patient would like to get a light weight w/chair with [facility], also she will need HHPT, would like to use [facility] for this as well.  Referral made to [facility] for [place], patient will have a $25 dollar co pay so she states she does not want HHRN.  NCM will cont to follow for dc needs.              Action/Plan:   Expected Discharge Date:  07/04/15               Expected Discharge Plan:  [place]  In-House Referral:     Discharge planning Services  CM Consult  Post Acute Care Choice:  Home Health Choice offered to:  Patient  DME Arranged:  Wheelchair manual DME Agency:  [place]:  PT [facility] Agency:  [place]  Status of Service:  Completed, signed off  Medicare Important Message Given:  Yes-second notification given Date Medicare IM Given:    Medicare IM give by:    Date Additional Medicare IM Given:    Additional Medicare Important Message give by:     If discussed at [place] of Stay Meetings, dates discussed:    Additional Comments:  Zenon Mayo, RN 07/04/2015, 4:40 PM

## 2015-07-04 NOTE — Progress Notes (Signed)
[place] TEAM 1 - Stepdown/ICU TEAM PROGRESS NOTE  Sheryl Curtis LAG:[id number] DOB: 1932-04-29 DOA: 07/01/2015 PCP: Donnie Coffin, MD  Admit HPI / Brief Narrative: 81 yo female w/hx of CAD s/p stent, A-fib, admitted on 9/3 w/1 day hx of fevers/chills & chest pain. Pt found to have UTI, acute on chronic renal failure, & initial lactic acid was 3.6. Patient treated as septic shock from UTI, started on IV Zosyn. Started having episodes of diarrhea, C. difficile negative. Patient's white blood cell count peaked as high as 47.5 on 9/4 and down to 24.5 9/5. Troponin has been slightly trending upward and currently at 0.19. lactic acid level by 9/4 normalized.   HPI/Subjective: The patient states she feels "crummy in general" but she denies any specific complaints today.  Her diarrhea persist but has improved with use of Imodium.  She denies chest pressure shortness of breath vomiting or headache.  Assessment/Plan:  Sepsis secondary to urinary tract infection Leukocytosis resolving - hemodynamically much more stable - cultures have thus far failed to reveal the offending pathogen - continue empiric broad-spectrum antibiotics  Chest pain - mildly elevated troponin Troponin peaked at 0.19 - clinically this appears to be very mild demand ischemia in setting of sepsis and not consistent with acute coronary syndrome - no focal wall motion abnormalities appreciated on TTE  Hyponatremia Slowly improving with volume expansion - follow  Hypokalemia Resolved with supplementation  Acute kidney injury Improving with volume expansion - continue to follow trend  Essential hypertension Blood pressure variable presently - follow without change in treatment plan today  Atrial fibrillation CHA2DS2 - VASc 5 - depending upon physical therapy and occupational therapy evaluation of stability on her feet we will need to consider initiating and anticoagulation - the patient's daughter does report that she has  suffered many falls recently  Chronic diastolic congestive heart failure Appears well compensated/mildly dehydrated on physical exam at this time  Diarrhea C. difficile negative - likely due to antibiotic therapy  GERD  Coronary artery disease  Code Status: FULL Family Communication: Spoke with daughter at bedside at length Disposition Plan: PT/OT evaluations - consider initiating anticoagulation for A. Fib - may very well require rehabilitation stay prior to discharge  Consultants: None  Procedures: TTE 9/5 diastolic dysfunction - EF 60-65%  Antibiotics: Zosyn 9/3 >  DVT prophylaxis: Lovenox  Objective: Blood pressure 160/44, pulse 63, temperature 97.7 F (36.5 C), temperature source Oral, resp. rate 18, height 5\' 4"  (1.626 m), weight 87.091 kg (192 lb), SpO2 98 %.  Intake/Output Summary (Last 24 hours) at 07/04/15 1518 Last data filed at 07/04/15 1110  Gross per 24 hour  Intake    120 ml  Output   1050 ml  Net   -930 ml   Exam: General: No acute respiratory distress Lungs: Clear to auscultation bilaterally without wheezes or crackles Cardiovascular: Regular rate and rhythm without murmur gallop or rub normal S1 and S2 Abdomen: Nontender, nondistended, soft, bowel sounds positive, no rebound, no ascites, no appreciable mass Extremities: No significant cyanosis, clubbing, or edema bilateral lower extremities  Data Reviewed: Basic Metabolic Panel:  Recent Labs Lab 07/01/15 1652 07/02/15 0001 07/02/15 0740 07/03/15 0518 07/04/15 0637  NA 127*  --  128* 130* 132*  K 3.3*  --  3.7 4.3 3.8  CL 91*  --  96* 99* 101  CO2 24  --  23 24 25   GLUCOSE 100*  --  89 98 93  BUN 29*  --  29* 28*  16  CREATININE 1.61*  --  1.89* 1.78* 1.43*  CALCIUM 8.2*  --  7.8* 7.6* 8.0*  MG  --  2.4  --   --   --     CBC:  Recent Labs Lab 07/01/15 1652 07/02/15 0740 07/03/15 1212 07/04/15 0637  WBC 26.1* 47.5* 24.5* 11.2*  NEUTROABS 23.5*  --   --   --   HGB 12.4 11.3*  9.7* 9.2*  HCT 37.4 33.6* 29.5* 27.7*  MCV 98.2 100.0 101.4* 98.2  PLT 210 182 132* 127*    Liver Function Tests:  Recent Labs Lab 07/01/15 1652  AST 27  ALT 20  ALKPHOS 48  BILITOT 0.7  PROT 6.0*  ALBUMIN 3.1*   Coags:  Recent Labs Lab 07/02/15 0100  INR 1.16    Recent Labs Lab 07/02/15 0100  APTT 135*    Cardiac Enzymes:  Recent Labs Lab 07/02/15 0100 07/02/15 0845 07/02/15 1550 07/03/15 1212 07/04/15 0637  TROPONINI 0.05* 0.12* 0.10* 0.19* 0.04*    CBG:  Recent Labs Lab 07/01/15 1829  GLUCAP 113*    Recent Results (from the past 240 hour(s))  Blood culture (routine x 2)     Status: None (Preliminary result)   Collection Time: 07/01/15  5:55 PM  Result Value Ref Range Status   Specimen Description BLOOD LEFT ARM  Final   Special Requests BOTTLES DRAWN AEROBIC AND ANAEROBIC 10CC  Final   Culture NO GROWTH 3 DAYS  Final   Report Status PENDING  Incomplete  Blood culture (routine x 2)     Status: None (Preliminary result)   Collection Time: 07/01/15  6:00 PM  Result Value Ref Range Status   Specimen Description BLOOD LEFT HAND  Final   Special Requests BOTTLES DRAWN AEROBIC ONLY 5CC  Final   Culture NO GROWTH 3 DAYS  Final   Report Status PENDING  Incomplete  Urine culture     Status: None   Collection Time: 07/02/15  3:38 PM  Result Value Ref Range Status   Specimen Description URINE, CLEAN CATCH  Final   Special Requests NONE  Final   Culture NO GROWTH 1 DAY  Final   Report Status 07/03/2015 FINAL  Final  C difficile quick scan w PCR reflex     Status: None   Collection Time: 07/03/15 12:36 AM  Result Value Ref Range Status   C Diff antigen NEGATIVE NEGATIVE Final   C Diff toxin NEGATIVE NEGATIVE Final   C Diff interpretation Negative for toxigenic C. difficile  Final     Studies:   Recent x-ray studies have been reviewed in detail by the Attending Physician  Scheduled Meds:  Scheduled Meds: . calcium carbonate  1,250 mg Oral Q  breakfast  . DULoxetine  60 mg Oral Daily  . [START ON 07/05/2015] enoxaparin (LOVENOX) injection  40 mg Subcutaneous Q24H  . folic acid  1 mg Oral Daily  . isosorbide mononitrate  30 mg Oral Daily  . Melatonin  3 mg Oral QHS  . [START ON 07/05/2015] methotrexate  20 mg Oral Q Wed  . multivitamin with minerals  1 tablet Oral Daily  . pantoprazole  40 mg Oral Daily  . piperacillin-tazobactam (ZOSYN)  IV  3.375 g Intravenous 3 times per day  . sodium chloride  3 mL Intravenous Q12H  . tiotropium  18 mcg Inhalation Daily  . vitamin C  1,000 mg Oral Daily  . zinc sulfate  220 mg Oral Daily    Time  spent on care of this patient: 35 mins   [facility] T , MD   [facility] Office  [phone] Pager - Text Page per Amion as per below:  On-Call/Text Page:      [email or web address]      password [email or web address]  If 7PM-7AM, please contact night-coverage [email or web address] Password [email or web address] 07/04/2015, 3:18 PM   LOS: 3 days

## 2015-07-04 NOTE — Care Management Important Message (Signed)
Important Message  Patient Details  Name: KLAIR LEISING MRN: [id number] Date of Birth: [birth year]   Medicare Important Message Given:  Yes-second notification given    Nathen May 07/04/2015, 11:55 AMImportant Message  Patient Details  Name: MADELLYN DENIO MRN: [id number] Date of Birth: 02/18/1932   Medicare Important Message Given:  Yes-second notification given    Nathen May 07/04/2015, 11:55 AM

## 2015-07-04 NOTE — Evaluation (Signed)
Physical Therapy Evaluation Patient Details Name: Sheryl Curtis MRN: [id number] DOB: [birth year] Today's Date: 07/04/2015   History of Present Illness  81 y.o. female admitted to [facility] on 07/01/15 for fever, chills, and chest pain.  Pt dx with spesis likely due to UTI, mildly elevated triponin (thought to be demand ischemia), hyponatremia, hypokalemia, and acute kidney injury. Pt with significant PMHx of HTN, A-fib, chest pain, dyspnea, fibromyalgia, colon CA, and back surgery.   Clinical Impression  Pt is generally weak and deconditioned, decreased activity tolerance and endurance.  Pt's daughter is going to come stay with her at discharge.  HR and O2 sats stable on RA throughout the session.  PT to follow acutely for deficits listed below.       Follow Up Recommendations Home health PT    Equipment Recommendations  Wheelchair (measurements PT);Wheelchair cushion (measurements PT)    Recommendations for Other Services   NA    Precautions / Restrictions Precautions Precautions: Fall Precaution Comments: pt is mildly unsteady on her feet, reports her right leg gives away on her at times.       Mobility  Bed Mobility               General bed mobility comments: pt up in recliner chair  Transfers Overall transfer level: Needs assistance Equipment used: Rolling walker (2 wheeled) Transfers: Sit to/from Stand Sit to Stand: Supervision         General transfer comment: supervision for safety during gait.  Ambulation/Gait Ambulation/Gait assistance: Min guard Ambulation Distance (Feet): 20 Feet Assistive device: Rolling walker (2 wheeled) Gait Pattern/deviations: Step-through pattern;Shuffle Gait velocity: decreased   General Gait Details: Pt with slow, but steady gait with RW, decreased activity tolerance, mild DOE with gait and reports of felling weaker than her normal self.        Balance Overall balance assessment: Needs assistance Sitting-balance support: Feet  supported;No upper extremity supported Sitting balance-Leahy Scale: Good     Standing balance support: Bilateral upper extremity supported Standing balance-Leahy Scale: Poor                               Pertinent Vitals/Pain Pain Assessment: No/denies pain    Home Living Family/patient expects to be discharged to:: Private residence Living Arrangements: Alone Available Help at Discharge: Family;Available 24 hours/day (daughter coming to stay with her until she feels better) Type of Home: House Home Access: Stairs to enter     Home Layout: One level;Other (Comment) (1 step into kitchen, 1 step into bathroom) Home Equipment: Walker - 2 wheels;Cane - single point      Prior Function Level of Independence: Independent with assistive device(s)         Comments: uses cane or walker depending on how she feels for gait        Extremity/Trunk Assessment   Upper Extremity Assessment: Defer to OT evaluation           Lower Extremity Assessment: Generalized weakness      Cervical / Trunk Assessment: Other exceptions  Communication   Communication: No difficulties  Cognition Arousal/Alertness: Awake/alert Behavior During Therapy: WFL for tasks assessed/performed Overall Cognitive Status: Within Functional Limits for tasks assessed                         Exercises General Exercises - Upper Extremity Shoulder Flexion: AROM;Both;10 reps;Seated Elbow Flexion: AROM;Both;10 reps;Seated General Exercises - Lower  Extremity Long Arc Quad: AROM;Both;10 reps;Seated Hip ABduction/ADduction: AROM;Both;10 reps;Seated Hip Flexion/Marching: AROM;Both;10 reps;Seated Toe Raises: AROM;Both;10 reps;Seated Heel Raises: AROM;Both;10 reps;Seated      Assessment/Plan    PT Assessment Patient needs continued PT services  PT Diagnosis Difficulty walking;Abnormality of gait;Generalized weakness   PT Problem List Decreased strength;Decreased activity  tolerance;Decreased balance;Decreased mobility;Decreased knowledge of use of DME  PT Treatment Interventions DME instruction;Gait training;Functional mobility training;Therapeutic activities;Therapeutic exercise;Balance training;Neuromuscular re-education;Patient/family education   PT Goals (Current goals can be found in the Care Plan section) Acute Rehab PT Goals Patient Stated Goal: to go home PT Goal Formulation: With patient Time For Goal Achievement: 07/18/15 Potential to Achieve Goals: Good    Frequency Min 3X/week    End of Session Equipment Utilized During Treatment: Gait belt Activity Tolerance: Patient limited by fatigue Patient left: in chair;with call bell/phone within reach;with family/visitor present           Time: 8088-1103 PT Time Calculation (min) (ACUTE ONLY): 26 min   Charges:   PT Evaluation $Initial PT Evaluation Tier I: 1 Procedure PT Treatments $Gait Training: 8-22 mins        Lilianne Delair B. Sawyer, [place], DPT [phone]   07/04/2015, 4:54 PM

## 2015-07-05 ENCOUNTER — Encounter: Payer: Self-pay | Admitting: *Deleted

## 2015-07-05 DIAGNOSIS — I2510 Atherosclerotic heart disease of native coronary artery without angina pectoris: Secondary | ICD-10-CM

## 2015-07-05 DIAGNOSIS — M159 Polyosteoarthritis, unspecified: Secondary | ICD-10-CM | POA: Insufficient documentation

## 2015-07-05 DIAGNOSIS — N183 Chronic kidney disease, stage 3 unspecified: Secondary | ICD-10-CM | POA: Insufficient documentation

## 2015-07-05 DIAGNOSIS — E876 Hypokalemia: Secondary | ICD-10-CM

## 2015-07-05 DIAGNOSIS — I10 Essential (primary) hypertension: Secondary | ICD-10-CM

## 2015-07-05 DIAGNOSIS — I482 Chronic atrial fibrillation: Secondary | ICD-10-CM

## 2015-07-05 DIAGNOSIS — N390 Urinary tract infection, site not specified: Secondary | ICD-10-CM

## 2015-07-05 DIAGNOSIS — K219 Gastro-esophageal reflux disease without esophagitis: Secondary | ICD-10-CM

## 2015-07-05 DIAGNOSIS — N3281 Overactive bladder: Secondary | ICD-10-CM | POA: Insufficient documentation

## 2015-07-05 DIAGNOSIS — A419 Sepsis, unspecified organism: Principal | ICD-10-CM

## 2015-07-05 DIAGNOSIS — E871 Hypo-osmolality and hyponatremia: Secondary | ICD-10-CM

## 2015-07-05 DIAGNOSIS — Z85038 Personal history of other malignant neoplasm of large intestine: Secondary | ICD-10-CM | POA: Insufficient documentation

## 2015-07-05 DIAGNOSIS — J449 Chronic obstructive pulmonary disease, unspecified: Secondary | ICD-10-CM | POA: Insufficient documentation

## 2015-07-05 DIAGNOSIS — R7989 Other specified abnormal findings of blood chemistry: Secondary | ICD-10-CM

## 2015-07-05 DIAGNOSIS — N179 Acute kidney failure, unspecified: Secondary | ICD-10-CM

## 2015-07-05 LAB — CBC
HCT: 28.8 % — ABNORMAL LOW (ref 36.0–46.0)
Hemoglobin: 9.4 g/dL — ABNORMAL LOW (ref 12.0–15.0)
MCH: 31.8 pg (ref 26.0–34.0)
MCHC: 32.6 g/dL (ref 30.0–36.0)
MCV: 97.3 fL (ref 78.0–100.0)
Platelets: 156 10*3/uL (ref 150–400)
RBC: 2.96 MIL/uL — ABNORMAL LOW (ref 3.87–5.11)
RDW: 14.5 % (ref 11.5–15.5)
WBC: 7.8 10*3/uL (ref 4.0–10.5)

## 2015-07-05 LAB — COMPREHENSIVE METABOLIC PANEL
ALT: 19 U/L (ref 14–54)
AST: 21 U/L (ref 15–41)
Albumin: 2.2 g/dL — ABNORMAL LOW (ref 3.5–5.0)
Alkaline Phosphatase: 47 U/L (ref 38–126)
Anion gap: 8 (ref 5–15)
BUN: 11 mg/dL (ref 6–20)
CO2: 26 mmol/L (ref 22–32)
Calcium: 8.2 mg/dL — ABNORMAL LOW (ref 8.9–10.3)
Chloride: 101 mmol/L (ref 101–111)
Creatinine, Ser: 1.36 mg/dL — ABNORMAL HIGH (ref 0.44–1.00)
GFR calc Af Amer: 41 mL/min — ABNORMAL LOW (ref >60)
GFR calc non Af Amer: 35 mL/min — ABNORMAL LOW (ref >60)
Glucose, Bld: 100 mg/dL — ABNORMAL HIGH (ref 65–99)
Potassium: 4.4 mmol/L (ref 3.5–5.1)
Sodium: 135 mmol/L (ref 135–145)
Total Bilirubin: 0.6 mg/dL (ref 0.3–1.2)
Total Protein: 5.3 g/dL — ABNORMAL LOW (ref 6.5–8.1)

## 2015-07-05 MED ORDER — CEPHALEXIN 500 MG PO CAPS
500.0000 mg | ORAL_CAPSULE | Freq: Two times a day (BID) | ORAL | Status: DC
  Administered 2015-07-05 – 2015-07-06 (×2): 500 mg via ORAL
  Filled 2015-07-05 (×2): qty 1

## 2015-07-05 MED ORDER — SALINE SPRAY 0.65 % NA SOLN
1.0000 | NASAL | Status: DC | PRN
  Administered 2015-07-05: 1 via NASAL
  Filled 2015-07-05: qty 44

## 2015-07-05 MED ORDER — APIXABAN 5 MG PO TABS
5.0000 mg | ORAL_TABLET | Freq: Two times a day (BID) | ORAL | Status: DC
  Administered 2015-07-05: 5 mg via ORAL
  Filled 2015-07-05 (×2): qty 1

## 2015-07-05 NOTE — Consult Note (Signed)
ANTICOAGULATION CONSULT NOTE - Initial Consult  Pharmacy Consult for apixaban Indication: atrial fibrillation  Assessment: 81 yo who presented with chest pain, fever and chills.   Pt with recent back pain (was on prednisone for past 2 weeks)  PMH: Colon CA, fibromyalgia, GERD, afib (no on ac due to fall risk)  Anticoagulation: on prophylactic enoxaparin   Plan:  D/C enoxaparin Initiate apixaban 5 mg BID Monitor renal fcn, BMP, s/sx of bleeding Patient educated about risks of bleeding on apixaban and need to be more attentive to preventing falls  Will sign off but be available if needed  Levester Fresh, PharmD, BCPS Clinical Pharmacist Pager [phone] 07/05/2015 1:34 PM

## 2015-07-05 NOTE — Progress Notes (Signed)
Pt's IV infiltrated. Pt states she is suppose to go home in the morning and doesn't want to have another IV. Schorr, NP stated it is okay to leave IV out. Will continue to monitor pt. Hassan Buckler

## 2015-07-05 NOTE — Evaluation (Signed)
Occupational Therapy Evaluation Patient Details Name: Sheryl Curtis MRN: [id number] DOB: 21-May-1932 Today's Date: 07/05/2015    History of Present Illness 81 y.o. female admitted to [facility] on 07/01/15 for fever, chills, and chest pain.  Pt dx with spesis likely due to UTI, mildly elevated triponin (thought to be demand ischemia), hyponatremia, hypokalemia, and acute kidney injury. Pt with significant PMHx of HTN, A-fib, chest pain, dyspnea, fibromyalgia, colon CA, and back surgery.    Clinical Impression   Pt currently modified independent with selfcare tasks using RW for support.  Will have initial assistance from her daughter at discharge as well.  No further OT needs at this time.  Pt has all needed DME.      Follow Up Recommendations  No OT follow up;Supervision - Intermittent    Equipment Recommendations  None recommended by OT       Precautions / Restrictions Precautions Precautions: Fall Restrictions Weight Bearing Restrictions: No      Mobility Bed Mobility Overal bed mobility: Modified Independent             General bed mobility comments: Pt has bed with adjustable head at home.  Transfers Overall transfer level: Modified independent Equipment used: Rolling walker (2 wheeled) Transfers: Sit to/from Stand Sit to Stand: Modified independent (Device/Increase time)              Balance     Sitting balance-Leahy Scale: Good       Standing balance-Leahy Scale: Fair                              ADL Overall ADL's : Modified independent                                       General ADL Comments: Pt currently modified indpendent for selfcare tasks and functional transfers with use of RW for safety.  Pt has all needed DME and will have her daughter stay with her initially at discharge as well.  Feel that functionally she has slightly lower endurance but is approaching baseline.      Vision Vision Assessment?: No apparent visual  deficits   Perception Perception Perception Tested?: No   Praxis Praxis Praxis tested?: Within functional limits    Pertinent Vitals/Pain Pain Assessment: No/denies pain     Hand Dominance Right   Extremity/Trunk Assessment Upper Extremity Assessment Upper Extremity Assessment: Overall WFL for tasks assessed   Lower Extremity Assessment Lower Extremity Assessment: Defer to PT evaluation   Cervical / Trunk Assessment Cervical / Trunk Assessment: Normal   Communication Communication Communication: No difficulties   Cognition Arousal/Alertness: Awake/alert Behavior During Therapy: WFL for tasks assessed/performed Overall Cognitive Status: Within Functional Limits for tasks assessed                                Home Living Family/patient expects to be discharged to:: Private residence Living Arrangements: Alone Available Help at Discharge: Family;Available 24 hours/day (daughter coming to stay with her until she feels better) Type of Home: House Home Access: Stairs to enter     Home Layout: One level;Other (Comment) (1 step into kitchen, 1 step into bathroom)     Bathroom Shower/Tub: Walk-in shower;Door   [employer] Toilet: Standard Bathroom Accessibility: Yes How Accessible: Accessible via walker Home Equipment:  Walker - 2 wheels;Cane - single point;Bedside commode          Prior Functioning/Environment Level of Independence: Independent with assistive device(s)        Comments: uses cane or walker depending on how she feels for gait                              End of Session Equipment Utilized During Treatment: Rolling walker Nurse Communication: Mobility status  Activity Tolerance: Patient tolerated treatment well Patient left: in chair   Time: 1435-1504 OT Time Calculation (min): 29 min Charges:  OT General Charges $OT Visit: 1 Procedure OT Evaluation $Initial OT Evaluation Tier I: 1 Procedure OT Treatments $Self  Care/Home Management : 8-22 mins  Valori Hollenkamp OTR/L 07/05/2015, 3:12 PM

## 2015-07-05 NOTE — Care Management Note (Addendum)
Case Management Note  Patient Details  Name: LASHANTA ELBE MRN: [id number] Date of Birth: 02-Feb-1932  Subjective/Objective:        Looks like Md looking at [employer] for patient, NCM gave patient 30 day savings card, her co pay will be $45 per benefit check.  Stephanie with [facility] will check on co pay for w/chair. W/chair will be co pay of $30.60 for the first month and then it will be less than $30.00 the months afterwards.  Patient states she would like to go ahead and get the w/chair.  She also states she would like to get a 3 n 1.              Action/Plan:   Expected Discharge Date:  07/04/15               Expected Discharge Plan:  [place]  In-House Referral:     Discharge planning Services  CM Consult  Post Acute Care Choice:  Home Health Choice offered to:  Patient  DME Arranged:  Wheelchair manual DME Agency:  [place]:  PT [facility] Agency:  [place]  Status of Service:  Completed, signed off  Medicare Important Message Given:  Yes-second notification given Date Medicare IM Given:    Medicare IM give by:    Date Additional Medicare IM Given:    Additional Medicare Important Message give by:     If discussed at [place] of Stay Meetings, dates discussed:    Additional Comments:  Zenon Mayo, RN 07/05/2015, 4:15 PM

## 2015-07-05 NOTE — Progress Notes (Signed)
S/W GREG @ [employer] [facility] # [id number]   XARELTO 15 MG FOR 21 DAYS THEN 20 MG DAILY  COVER- YES  CO-PAY-$ 45.00 30 DAY SUPPLY    SAME  TIER- 3 DRUG  PRIOR APPROVAL - NO  PHARMACY : ANY RETAIL   ** ELIQUIS 10 MG X 7 DAYS -( ELIQUIS DON'T COME IN 10 MG ) **   ELIQUIS 5 MG OR 2.5 MG BID A DAY  COVER- YES  CO-PAY - $ 45.00   TIER- 3 DRUG  PRIOR APPROVAL - NO  PHARMACY :ANY RETAIL   PRADAXA 150 MG BID  COVER- YES  CO-PAY- $ 85.00  TIER- 4 DRUG  PRIOR APPROVAL - NO  PHARMACY : ANY RETAIL

## 2015-07-05 NOTE — Progress Notes (Signed)
Physical Therapy Treatment Patient Details Name: Sheryl Curtis MRN: [id number] DOB: Dec 17, 1931 Today's Date: 07/05/2015    History of Present Illness 81 y.o. female admitted to [facility] on 07/01/15 for fever, chills, and chest pain.  Pt dx with spesis likely due to UTI, mildly elevated triponin (thought to be demand ischemia), hyponatremia, hypokalemia, and acute kidney injury. Pt with significant PMHx of HTN, A-fib, chest pain, dyspnea, fibromyalgia, colon CA, and back surgery.     PT Comments    Progressing quite well today towards functional goals. Ambulating up to 200 feet with single standing rest break, using a rolling walker for support. VSS. No loss of balance while using this device. Patient will continue to benefit from skilled physical therapy services to further improve independence with functional mobility.   Follow Up Recommendations  Home health PT     Equipment Recommendations  None recommended by PT    Recommendations for Other Services       Precautions / Restrictions Precautions Precautions: Fall Precaution Comments: pt is mildly unsteady on her feet, reports her right leg gives away on her at times at baseline Restrictions Weight Bearing Restrictions: No    Mobility  Bed Mobility Overal bed mobility: Modified Independent             General bed mobility comments: in chair  Transfers Overall transfer level: Needs assistance Equipment used: Rolling walker (2 wheeled) Transfers: Sit to/from Stand Sit to Stand: Supervision         General transfer comment: Supervision for safety. Good control with descent. Using little UE support to steady self.  Ambulation/Gait Ambulation/Gait assistance: Supervision Ambulation Distance (Feet): 200 Feet Assistive device: Rolling walker (2 wheeled) Gait Pattern/deviations: Step-through pattern;Decreased stride length Gait velocity: decreased   General Gait Details: Slow, mildly guarded gait but no loss of balance  noted while using a RW for support. Requried one standing rest break to complete distance. Minimal dyspnea, SpO2 96% on room air. Cues for forward gaze intermittently.  Supervision for safety.   Stairs            Wheelchair Mobility    Modified Rankin (Stroke Patients Only)       Balance     Sitting balance-Leahy Scale: Good       Standing balance-Leahy Scale: Fair                      Cognition Arousal/Alertness: Awake/alert Behavior During Therapy: WFL for tasks assessed/performed Overall Cognitive Status: Within Functional Limits for tasks assessed                      Exercises General Exercises - Lower Extremity Ankle Circles/Pumps: AROM;Both;10 reps;Seated Quad Sets: Strengthening;Both;10 reps;Seated Gluteal Sets: Strengthening;Both;10 reps;Seated Long Arc Quad: Both;10 reps;Seated;Strengthening Hip Flexion/Marching: Both;10 reps;Seated;Strengthening    General Comments        Pertinent Vitals/Pain Pain Assessment: No/denies pain    Home Living Family/patient expects to be discharged to:: Private residence Living Arrangements: Alone Available Help at Discharge: Family;Available 24 hours/day (daughter coming to stay with her until she feels better) Type of Home: House Home Access: Stairs to enter   Home Layout: One level;Other (Comment) (1 step into kitchen, 1 step into bathroom) Home Equipment: Walker - 2 wheels;Cane - single point;Bedside commode      Prior Function Level of Independence: Independent with assistive device(s)      Comments: uses cane or walker depending on how she feels for gait  PT Goals (current goals can now be found in the care plan section) Acute Rehab PT Goals Patient Stated Goal: to go home PT Goal Formulation: With patient Time For Goal Achievement: 07/18/15 Potential to Achieve Goals: Good Progress towards PT goals: Progressing toward goals    Frequency  Min 3X/week    PT Plan Current plan  remains appropriate;Equipment recommendations need to be updated    Co-evaluation             End of Session Equipment Utilized During Treatment: Gait belt Activity Tolerance: Patient tolerated treatment well Patient left: in chair;with call bell/phone within reach     Time: 1505-1520 PT Time Calculation (min) (ACUTE ONLY): 15 min  Charges:  $Gait Training: 8-22 mins                    G Codes:      Ellouise Newer July 20, 2015, 3:59 PM Camille Bal [place], [place]

## 2015-07-05 NOTE — Progress Notes (Signed)
[facility]                                                                              Patient Demographics  Sheryl Curtis, is a 81 y.o. female, DOB - 1932-05-21, WIO:[id number]  Admit date - 07/01/2015   Admitting Physician Theressa Millard, MD  Outpatient Primary MD for the patient is Donnie Coffin, MD  LOS - 4   Chief Complaint  Patient presents with  . Chest Pain      HPI on 07/01/2015 by Dr. Jana Hakim Sheryl Curtis is a 81 y.o. female with a history of HTN, CAD S/P PCI with Stent, Atrial Fibrillation, and Fibromyalgia who presents to the ED with complaints of Fevers and Chills since the AM, and Chest pain radiating between her shoulder blades. She is undergoing a workup as an outpatient for chronic back pain and went for and MRI of the Lumbar spine this AM. She has been on prednisone Rx for her back pain for the past 2 weeks, and has had malaise since. She was evaluated in the ED and found to have a positive UA, and was referred for further evalaution and treatment and a cardiac rule out.  Assessment & Plan   Sepsis secondary urinary tract infection -Leukocytosis resolved -Will discontinue Zosyn, and placed on Keflex -Urine culture shows no growth to date -Blood cultures show no growth to date  Chest pain and mildly elevated troponin -Troponin peaked at 0.19 -Likely secondary to demand ischemia in the setting of sepsis, unlikely consistent with acute coronary syndrome -Patient denies chest pain -Echocardiogram showed no wall motion abnormalities, EF of 42-68%, diastolic dysfunction  Hyponatremia -Resolved  Hypokalemia -Resolved  Acute kidney injury -Creatinine improving, currently 1.36 -Continue to monitor BMP   Essential hypertension -Valsartan/HCTZ held -Continue imdur  Atrial fibrillation -CHADSVASC 5 -Discussed anticoagulation therapy with patient at length as patient is a fall risk -Will have pharmacy also discussed this with the  patient  Chronic diastolic heart failure -Appears compensated on exam  Diarrhea -C. difficile negative, likely secondary to and miotic therapy -Patient denies any further episodes  GERD -PPI   Coronary artery disease -Continue her medications, Plavix and Imdur  Deconditioning and frequent falls -PT consulted and recommended home health  Code Status: Full  Family Communication: None at bedside. Asked patient if she would like you speak with her daughter she stated no she can make her decisions.  Disposition Plan: Admitted.  Possible d/c within 24 hours  Time Spent in minutes   30 minutes  Procedures  Echocardiogram  Consults   None  DVT Prophylaxis  lovenox  Lab Results  Component Value Date   PLT 156 07/05/2015    Medications  Scheduled Meds: . calcium carbonate  1,250 mg Oral Q breakfast  . cephALEXin  500 mg Oral Q12H  . DULoxetine  60 mg Oral Daily  . enoxaparin (LOVENOX) injection  40 mg Subcutaneous Q24H  . folic acid  1 mg Oral Daily  . isosorbide mononitrate  30 mg Oral Daily  . Melatonin  3 mg Oral QHS  . methotrexate  20 mg Oral Q Wed  . multivitamin with minerals  1 tablet Oral Daily  .  pantoprazole  40 mg Oral Daily  . sodium chloride  3 mL Intravenous Q12H  . tiotropium  18 mcg Inhalation Daily  . vitamin C  1,000 mg Oral Daily  . zinc sulfate  220 mg Oral Daily   Continuous Infusions: . sodium chloride 50 mL/hr at 07/05/15 0406   PRN Meds:.acetaminophen **OR** acetaminophen, alum & mag hydroxide-simeth, HYDROmorphone (DILAUDID) injection, loperamide, nitroGLYCERIN, ondansetron **OR** ondansetron (ZOFRAN) IV, oxyCODONE, sodium chloride  Antibiotics   Anti-infectives    Start     Dose/Rate Route Frequency Ordered Stop   07/05/15 1800  cephALEXin (KEFLEX) capsule 500 mg     500 mg Oral Every 12 hours 07/05/15 0929 07/09/15 1759   07/04/15 0600  vancomycin (VANCOCIN) IVPB 1000 mg/200 mL premix  Status:  Discontinued     1,000 mg 200 mL/hr  over 60 Minutes Intravenous Every 48 hours 07/02/15 0016 07/02/15 1232   07/02/15 0600  piperacillin-tazobactam (ZOSYN) IVPB 3.375 g  Status:  Discontinued     3.375 g 12.5 mL/hr over 240 Minutes Intravenous 3 times per day 07/02/15 0016 07/05/15 0929   07/01/15 2300  vancomycin (VANCOCIN) 1,500 mg in sodium chloride 0.9 % 500 mL IVPB     1,500 mg 250 mL/hr over 120 Minutes Intravenous  Once 07/01/15 2226 07/02/15 0510   07/01/15 2230  piperacillin-tazobactam (ZOSYN) IVPB 3.375 g     3.375 g 100 mL/hr over 30 Minutes Intravenous  Once 07/01/15 2226 07/01/15 2326        Subjective:   Sheryl Curtis seen and examined today. Patient continues to complain of feeling weak and tired. Denies any chest pain, abdominal pain, shortness of breath, dizziness or headache.   Objective:   Filed Vitals:   07/04/15 1623 07/04/15 1938 07/05/15 0209 07/05/15 0538  BP: 148/47 130/32 172/54 165/56  Pulse: 72 74 74 64  Temp: 97.4 F (36.3 C) 99.4 F (37.4 C) 98.5 F (36.9 C) 98.3 F (36.8 C)  TempSrc: Oral Oral Oral Oral  Resp: 18 20 16 18   Height:      Weight:      SpO2: 99% 99% 98% 98%    Wt Readings from Last 3 Encounters:  07/01/15 87.091 kg (192 lb)  06/05/15 87.091 kg (192 lb)  05/30/15 87.145 kg (192 lb 1.9 oz)     Intake/Output Summary (Last 24 hours) at 07/05/15 1327 Last data filed at 07/05/15 1041  Gross per 24 hour  Intake  987.5 ml  Output    900 ml  Net   87.5 ml    Exam  General: Well developed, well nourished, NAD, appears stated age  HEENT: NCAT, mucous membranes moist.   Cardiovascular: S1 S2 auscultated, no rubs, murmurs or gallops. Regular rate and rhythm.  Respiratory: Clear to auscultation bilaterally with equal chest rise  Abdomen: Soft, nontender, nondistended, + bowel sounds  Extremities: warm dry without cyanosis clubbing or edema  Neuro: AAOx3, nonfocal  Data Review   Micro Results Recent Results (from the past 240 hour(s))  Blood culture  (routine x 2)     Status: None (Preliminary result)   Collection Time: 07/01/15  5:55 PM  Result Value Ref Range Status   Specimen Description BLOOD LEFT ARM  Final   Special Requests BOTTLES DRAWN AEROBIC AND ANAEROBIC 10CC  Final   Culture NO GROWTH 3 DAYS  Final   Report Status PENDING  Incomplete  Blood culture (routine x 2)     Status: None (Preliminary result)   Collection Time: 07/01/15  6:00 PM  Result Value Ref Range Status   Specimen Description BLOOD LEFT HAND  Final   Special Requests BOTTLES DRAWN AEROBIC ONLY 5CC  Final   Culture NO GROWTH 3 DAYS  Final   Report Status PENDING  Incomplete  Urine culture     Status: None   Collection Time: 07/02/15  3:38 PM  Result Value Ref Range Status   Specimen Description URINE, CLEAN CATCH  Final   Special Requests NONE  Final   Culture NO GROWTH 1 DAY  Final   Report Status 07/03/2015 FINAL  Final  C difficile quick scan w PCR reflex     Status: None   Collection Time: 07/03/15 12:36 AM  Result Value Ref Range Status   C Diff antigen NEGATIVE NEGATIVE Final   C Diff toxin NEGATIVE NEGATIVE Final   C Diff interpretation Negative for toxigenic C. difficile  Final    Radiology Reports Dg Chest Portable 1 View  07/01/2015   CLINICAL DATA:  SOB, CP, baseline cough. Hx a-fib.  EXAM: PORTABLE CHEST - 1 VIEW  COMPARISON:  04/02/2014  FINDINGS: The heart size and mediastinal contours are within normal limits. Both lungs are clear. The visualized skeletal structures are unremarkable.  IMPRESSION: No active disease.   Electronically Signed   By: Nolon Nations M.D.   On: 07/01/2015 16:58    CBC  Recent Labs Lab 07/01/15 1652 07/02/15 0740 07/03/15 1212 07/04/15 0637 07/05/15 0615  WBC 26.1* 47.5* 24.5* 11.2* 7.8  HGB 12.4 11.3* 9.7* 9.2* 9.4*  HCT 37.4 33.6* 29.5* 27.7* 28.8*  PLT 210 182 132* 127* 156  MCV 98.2 100.0 101.4* 98.2 97.3  MCH 32.5 33.6 33.3 32.6 31.8  MCHC 33.2 33.6 32.9 33.2 32.6  RDW 14.6 15.0 15.0 14.7 14.5   LYMPHSABS 0.8  --   --   --   --   MONOABS 1.8*  --   --   --   --   EOSABS 0.0  --   --   --   --   BASOSABS 0.0  --   --   --   --     Chemistries   Recent Labs Lab 07/01/15 1652 07/02/15 0001 07/02/15 0740 07/03/15 0518 07/04/15 0637 07/05/15 0615  NA 127*  --  128* 130* 132* 135  K 3.3*  --  3.7 4.3 3.8 4.4  CL 91*  --  96* 99* 101 101  CO2 24  --  23 24 25 26   GLUCOSE 100*  --  89 98 93 100*  BUN 29*  --  29* 28* 16 11  CREATININE 1.61*  --  1.89* 1.78* 1.43* 1.36*  CALCIUM 8.2*  --  7.8* 7.6* 8.0* 8.2*  MG  --  2.4  --   --   --   --   AST 27  --   --   --   --  21  ALT 20  --   --   --   --  19  ALKPHOS 48  --   --   --   --  47  BILITOT 0.7  --   --   --   --  0.6   ------------------------------------------------------------------------------------------------------------------ estimated creatinine clearance is 34.7 mL/min (by C-G formula based on Cr of 1.36). ------------------------------------------------------------------------------------------------------------------ No results for input(s): HGBA1C in the last 72 hours. ------------------------------------------------------------------------------------------------------------------ No results for input(s): CHOL, HDL, LDLCALC, TRIG, CHOLHDL, LDLDIRECT in the last 72 hours. ------------------------------------------------------------------------------------------------------------------ No results for input(s): TSH,  T4TOTAL, T3FREE, THYROIDAB in the last 72 hours.  Invalid input(s): FREET3 ------------------------------------------------------------------------------------------------------------------ No results for input(s): VITAMINB12, FOLATE, FERRITIN, TIBC, IRON, RETICCTPCT in the last 72 hours.  Coagulation profile  Recent Labs Lab 07/02/15 0100  INR 1.16    No results for input(s): DDIMER in the last 72 hours.  Cardiac Enzymes  Recent Labs Lab 07/02/15 1550 07/03/15 1212 07/04/15 0637    TROPONINI 0.10* 0.19* 0.04*   ------------------------------------------------------------------------------------------------------------------ Invalid input(s): POCBNP    Kyliah Deanda D.O. on 07/05/2015 at 1:27 PM  Between 7am to 7pm - Pager - [phone]  After 7pm go to [email or web address] - password [email or web address]  And look for the night coverage person covering for me after hours  [facility] Office  [phone]

## 2015-07-06 LAB — CBC
HCT: 28.3 % — ABNORMAL LOW (ref 36.0–46.0)
Hemoglobin: 9.4 g/dL — ABNORMAL LOW (ref 12.0–15.0)
MCH: 32.2 pg (ref 26.0–34.0)
MCHC: 33.2 g/dL (ref 30.0–36.0)
MCV: 96.9 fL (ref 78.0–100.0)
Platelets: 171 10*3/uL (ref 150–400)
RBC: 2.92 MIL/uL — ABNORMAL LOW (ref 3.87–5.11)
RDW: 14.6 % (ref 11.5–15.5)
WBC: 6.9 10*3/uL (ref 4.0–10.5)

## 2015-07-06 LAB — CULTURE, BLOOD (ROUTINE X 2)
Culture: NO GROWTH
Culture: NO GROWTH

## 2015-07-06 LAB — BASIC METABOLIC PANEL
Anion gap: 9 (ref 5–15)
BUN: 9 mg/dL (ref 6–20)
CO2: 25 mmol/L (ref 22–32)
Calcium: 8.4 mg/dL — ABNORMAL LOW (ref 8.9–10.3)
Chloride: 99 mmol/L — ABNORMAL LOW (ref 101–111)
Creatinine, Ser: 1.15 mg/dL — ABNORMAL HIGH (ref 0.44–1.00)
GFR calc Af Amer: 50 mL/min — ABNORMAL LOW (ref >60)
GFR calc non Af Amer: 43 mL/min — ABNORMAL LOW (ref >60)
Glucose, Bld: 91 mg/dL (ref 65–99)
Potassium: 3.8 mmol/L (ref 3.5–5.1)
Sodium: 133 mmol/L — ABNORMAL LOW (ref 135–145)

## 2015-07-06 MED ORDER — CEPHALEXIN 500 MG PO CAPS: 500.0000 mg | ORAL_CAPSULE | Freq: Two times a day (BID) | ORAL | Status: DC

## 2015-07-06 NOTE — Care Management Note (Signed)
Case Management Note  Patient Details  Name: Sheryl Curtis MRN: [id number] Date of Birth: 1932-02-03  Subjective/Objective:      Patient is for dc today, her daughter will be transporting her home, NCM spoke with Tiffany with [facility] she will bring the w/chair and the 3 n 1 up to patient's room.  NCM also notified Colletta Maryland with [facility] that patient is for dc today and she will have HHPT.   Patient is not sure she wants eliquis , NCM gave patient 30 day savings card yesterday and explained to her that co pay would be $45.00.  She can take savings card with her.                Action/Plan:   Expected Discharge Date:  07/04/15               Expected Discharge Plan:  [place]  In-House Referral:     Discharge planning Services  CM Consult  Post Acute Care Choice:  Home Health Choice offered to:  Patient  DME Arranged:  Wheelchair manual DME Agency:  [place]:  PT [facility] Agency:  [facility]  Status of Service:  Completed, signed off  Medicare Important Message Given:  Yes-second notification given Date Medicare IM Given:    Medicare IM give by:    Date Additional Medicare IM Given:    Additional Medicare Important Message give by:     If discussed at [place] of Stay Meetings, dates discussed:    Additional Comments:  Zenon Mayo, RN 07/06/2015, 10:35 AM

## 2015-07-06 NOTE — Discharge Summary (Signed)
Physician Discharge Summary  Sheryl Curtis NOM:[id number] DOB: 01-Nov-1931 DOA: 07/01/2015  PCP: Donnie Coffin, MD  Admit date: 07/01/2015 Discharge date: 07/06/2015  Time spent: 45 minutes  Recommendations for Outpatient Follow-up:  Patient will be discharged to home with home health physical therapy.  Patient will need to follow up with primary care provider within one week of discharge, repeat CBC and BMP and discuss blood thinning medications.  Follow up with you cardiologist in 1-2 weeks. Patient should continue medications as prescribed.  Patient should follow a heart healthy diet.   Discharge Diagnoses:  Sepsis secondary to urinary tract infection Chest pain with mildly elevated troponin Hyponatremia Hypokalemia Acute kidney injury and excellent essential hypertension Atrial fibrillation Chronic diastolic heart failure Diarrhea GERD Coronary artery disease Deconditioning with frequent falls  Discharge Condition: stable  Diet recommendation: Heart healthy  Filed Weights   07/01/15 1900 07/01/15 2345  Weight: 87.2 kg (192 lb 3.9 oz) 87.091 kg (192 lb)    History of present illness:  on 07/01/2015 by Dr. Jana Hakim Sheryl Curtis is a 81 y.o. female with a history of HTN, CAD S/P PCI with Stent, Atrial Fibrillation, and Fibromyalgia who presents to the ED with complaints of Fevers and Chills since the AM, and Chest pain radiating between her shoulder blades. She is undergoing a workup as an outpatient for chronic back pain and went for and MRI of the Lumbar spine this AM. She has been on prednisone Rx for her back pain for the past 2 weeks, and has had malaise since. She was evaluated in the ED and found to have a positive UA, and was referred for further evalaution and treatment and a cardiac rule out.  Hospital Course:  Sepsis secondary urinary tract infection -Leukocytosis resolved -Discontinued Zosyn, and placed on Keflex -Urine culture shows no growth to  date -Blood cultures show no growth to date  Chest pain and mildly elevated troponin -Troponin peaked at 0.19 -Likely secondary to demand ischemia in the setting of sepsis, unlikely consistent with acute coronary syndrome -Patient denies chest pain -Echocardiogram showed no wall motion abnormalities, EF of 96-28%, diastolic dysfunction  Hyponatremia -Resolved  Hypokalemia -Resolved  Acute on chronic kidney disease, stage 3 -Creatinine improving, currently 1.15 -Upon reviewing records, GFR has been in stage 3-4 since  2014 -Repeat BMP  Essential hypertension -Valsartan/HCTZ held, discuss resumption with PCP -Continue imdur  Atrial fibrillation -CHADSVASC 5 -Discussed anticoagulation therapy with patient at length as patient is a fall risk and discussed bleeding risk -Pharmacy also discussed risks vs benefits with patient -patient will need to discuss with her PCP or cardiologist starting NOAC  Chronic diastolic heart failure -Appears compensated on exam  Diarrhea -C. difficile negative, likely secondary to antibiotic therapy -Patient denies any further episodes  GERD -PPI   Coronary artery disease -Continue her medications, Plavix and Imdur  Deconditioning and frequent falls -PT consulted and recommended home health -OT did not recommend further follow up  Procedures  Echocardiogram  Consults  None  Discharge Exam: Filed Vitals:   07/06/15 0625  BP: 177/48  Pulse: 75  Temp: 99 F (37.2 C)  Resp: 18   Exam  General: Well developed, well nourished, no distress  HEENT: NCAT, mucous membranes moist.   Cardiovascular: S1 S2 auscultated, RRR  Respiratory: Clear to auscultation  Abdomen: Soft, nontender, nondistended, + bowel sounds  Extremities: warm dry without cyanosis clubbing or edema  Neuro: AAOx3, nonfocal  Discharge Instructions      Discharge Instructions  Discharge instructions    Complete by:  As directed   Patient will be  discharged to home with home health physical therapy.  Patient will need to follow up with primary care provider within one week of discharge, repeat CBC and BMP and discuss blood thinning medications.  Follow up with you cardiologist in 1-2 weeks. Patient should continue medications as prescribed.  Patient should follow a heart healthy diet.            Medication List    STOP taking these medications        Amlodipine-Valsartan-HCTZ 10-320-25 MG Tabs     budesonide-formoterol 160-4.5 MCG/ACT inhaler  Commonly known as:  SYMBICORT     valsartan-hydrochlorothiazide 160-25 MG per tablet  Commonly known as:  DIOVAN-HCT      TAKE these medications        Albuterol Sulfate 108 (90 BASE) MCG/ACT Aepb  Commonly known as:  PROAIR RESPICLICK  Inhale 1-2 puffs into the lungs as needed (for shortness of breath and/or wheezing.).     BIOTIN MAXIMUM STRENGTH 5 MG Caps  Generic drug:  Biotin  Take 5 mg by mouth daily.     CALCIUM PO  Take 1,200 mg by mouth daily.     cephALEXin 500 MG capsule  Commonly known as:  KEFLEX  Take 1 capsule (500 mg total) by mouth every 12 (twelve) hours.     clopidogrel 75 MG tablet  Commonly known as:  PLAVIX  Take 1 tablet (75 mg total) by mouth daily.     DULoxetine 60 MG capsule  Commonly known as:  CYMBALTA  Take 60 mg by mouth daily.     folic acid 1 MG tablet  Commonly known as:  FOLVITE  Take 1 mg by mouth daily.     gabapentin 100 MG capsule  Commonly known as:  NEURONTIN  Take 300 mg by mouth at bedtime.     isosorbide mononitrate 30 MG 24 hr tablet  Commonly known as:  IMDUR  Take 1 tablet (30 mg total) by mouth daily.     Melatonin 10 MG Tabs  Take 10 mg by mouth at bedtime.     methotrexate 2.5 MG tablet  Commonly known as:  RHEUMATREX  Take 20 mg by mouth once a week.     multivitamin with minerals Tabs tablet  Take 1 tablet by mouth daily.     nitroGLYCERIN 0.4 MG SL tablet  Commonly known as:  NITROSTAT  Place 1  tablet (0.4 mg total) under the tongue every 5 (five) minutes as needed for chest pain.     pantoprazole 40 MG tablet  Commonly known as:  PROTONIX  Take 40 mg by mouth daily.     PRESCRIPTION MEDICATION  Apply 1 drop to eye 2 (two) times daily.     SPIRIVA HANDIHALER 18 MCG inhalation capsule  Generic drug:  tiotropium  Place 18 mcg into inhaler and inhale daily. Use as directed     vitamin C 1000 MG tablet  Take 1,000 mg by mouth daily.     zinc gluconate 50 MG tablet  Take 50 mg by mouth daily.       Allergies  Allergen Reactions  . Aspirin Anaphylaxis, Hives and Shortness Of Breath  . Prednisone Shortness Of Breath and Itching    anxiety  . Ace Inhibitors Other (See Comments)    cough  . Hydroxyquinolines Hives  . Imipramine Hcl   . Pamelor [Nortriptyline Hcl] Other (See Comments)  unknown  . Procardia [Nifedipine] Other (See Comments)    Unknown   . Tramadol Swelling  . Plaquenil [Hydroxychloroquine Sulfate] Rash   Follow-up Information    Follow up with [facility].   Why:  HHPT   Contact information:   [address] [place] [address] [phone]       Follow up with Donnie Coffin, MD. Schedule an appointment as soon as possible for a visit in 1 week.   Specialty:  Family Medicine   Why:  Hospital follow up   Contact information:   [address] [place] [address] [phone]        The results of significant diagnostics from this hospitalization (including imaging, microbiology, ancillary and laboratory) are listed below for reference.    Significant Diagnostic Studies: Dg Chest Portable 1 View  07/01/2015   CLINICAL DATA:  SOB, CP, baseline cough. Hx a-fib.  EXAM: PORTABLE CHEST - 1 VIEW  COMPARISON:  04/02/2014  FINDINGS: The heart size and mediastinal contours are within normal limits. Both lungs are clear. The visualized skeletal structures are unremarkable.  IMPRESSION: No active disease.    Electronically Signed   By: Nolon Nations M.D.   On: 07/01/2015 16:58    Microbiology: Recent Results (from the past 240 hour(s))  Blood culture (routine x 2)     Status: None (Preliminary result)   Collection Time: 07/01/15  5:55 PM  Result Value Ref Range Status   Specimen Description BLOOD LEFT ARM  Final   Special Requests BOTTLES DRAWN AEROBIC AND ANAEROBIC 10CC  Final   Culture NO GROWTH 4 DAYS  Final   Report Status PENDING  Incomplete  Blood culture (routine x 2)     Status: None (Preliminary result)   Collection Time: 07/01/15  6:00 PM  Result Value Ref Range Status   Specimen Description BLOOD LEFT HAND  Final   Special Requests BOTTLES DRAWN AEROBIC ONLY 5CC  Final   Culture NO GROWTH 4 DAYS  Final   Report Status PENDING  Incomplete  Urine culture     Status: None   Collection Time: 07/02/15  3:38 PM  Result Value Ref Range Status   Specimen Description URINE, CLEAN CATCH  Final   Special Requests NONE  Final   Culture NO GROWTH 1 DAY  Final   Report Status 07/03/2015 FINAL  Final  C difficile quick scan w PCR reflex     Status: None   Collection Time: 07/03/15 12:36 AM  Result Value Ref Range Status   C Diff antigen NEGATIVE NEGATIVE Final   C Diff toxin NEGATIVE NEGATIVE Final   C Diff interpretation Negative for toxigenic C. difficile  Final     Labs: Basic Metabolic Panel:  Recent Labs Lab 07/02/15 0001 07/02/15 0740 07/03/15 0518 07/04/15 0637 07/05/15 0615 07/06/15 0714  NA  --  128* 130* 132* 135 133*  K  --  3.7 4.3 3.8 4.4 3.8  CL  --  96* 99* 101 101 99*  CO2  --  23 24 25 26 25   GLUCOSE  --  89 98 93 100* 91  BUN  --  29* 28* 16 11 9   CREATININE  --  1.89* 1.78* 1.43* 1.36* 1.15*  CALCIUM  --  7.8* 7.6* 8.0* 8.2* 8.4*  MG 2.4  --   --   --   --   --    Liver Function Tests:  Recent Labs Lab 07/01/15 1652 07/05/15  0615  AST 27 21  ALT 20 19  ALKPHOS 48 47  BILITOT 0.7 0.6  PROT 6.0* 5.3*  ALBUMIN 3.1* 2.2*   No results for  input(s): LIPASE, AMYLASE in the last 168 hours. No results for input(s): AMMONIA in the last 168 hours. CBC:  Recent Labs Lab 07/01/15 1652 07/02/15 0740 07/03/15 1212 07/04/15 0637 07/05/15 0615  WBC 26.1* 47.5* 24.5* 11.2* 7.8  NEUTROABS 23.5*  --   --   --   --   HGB 12.4 11.3* 9.7* 9.2* 9.4*  HCT 37.4 33.6* 29.5* 27.7* 28.8*  MCV 98.2 100.0 101.4* 98.2 97.3  PLT 210 182 132* 127* 156   Cardiac Enzymes:  Recent Labs Lab 07/02/15 0100 07/02/15 0845 07/02/15 1550 07/03/15 1212 07/04/15 0637  TROPONINI 0.05* 0.12* 0.10* 0.19* 0.04*   BNP: BNP (last 3 results)  Recent Labs  07/01/15 1653  BNP 82.0    ProBNP (last 3 results) No results for input(s): PROBNP in the last 8760 hours.  CBG:  Recent Labs Lab 07/01/15 1829  GLUCAP 113*       Signed:  Cristal Ford  [facility] 07/06/2015, 10:26 AM

## 2015-07-06 NOTE — Discharge Instructions (Signed)

## 2015-07-07 ENCOUNTER — Ambulatory Visit (INDEPENDENT_AMBULATORY_CARE_PROVIDER_SITE_OTHER): Payer: PPO | Admitting: Interventional Cardiology

## 2015-07-07 ENCOUNTER — Encounter: Payer: Self-pay | Admitting: Interventional Cardiology

## 2015-07-07 VITALS — BP 150/62 | HR 69 | Ht 64.0 in | Wt 195.0 lb

## 2015-07-07 DIAGNOSIS — I10 Essential (primary) hypertension: Secondary | ICD-10-CM | POA: Diagnosis not present

## 2015-07-07 DIAGNOSIS — I25119 Atherosclerotic heart disease of native coronary artery with unspecified angina pectoris: Secondary | ICD-10-CM | POA: Diagnosis not present

## 2015-07-07 MED ORDER — CLOPIDOGREL BISULFATE 75 MG PO TABS: 75.0000 mg | ORAL_TABLET | ORAL | Status: DC

## 2015-07-07 MED ORDER — NITROGLYCERIN 0.4 MG SL SUBL: 0.4000 mg | SUBLINGUAL_TABLET | SUBLINGUAL | Status: DC | PRN

## 2015-07-07 NOTE — Progress Notes (Signed)
Patient ID: Sheryl Curtis, female   DOB: Oct 23, 1932, 81 y.o.   MRN: [id number]     Cardiology Office Note   Date:  07/07/2015   ID:  Sheryl Curtis, DOB 1932/05/10, MRN [id number]  PCP:  Donnie Coffin, MD    No chief complaint on file. CAD   Wt Readings from Last 3 Encounters:  07/07/15 195 lb (88.451 kg)  07/01/15 192 lb (87.091 kg)  06/05/15 192 lb (87.091 kg)       History of Present Illness: Sheryl Curtis is a 81 y.o. female  who had a drug-eluting LAD stent placed in 2014. She had been having angina on occasion and using nitroglycerin. She was recently hospitalized with a urinary tract infection and urosepsis. She was treated with IV antibiotics. She was discharged from the hospital yesterday. She still feels generally weak. While in the hospital, she had an elevated troponin. She had echocardiogram showing normal left ventricular function with no regional wall motion abnormalities. The troponin leak was thought to be due to demand ischemia.  She still has a cough. She had had a bloody nose as well. She was concerned about taking clopidogrel and does not want to take it was successfully necessary.    Past Medical History  Diagnosis Date  . Essential hypertension, benign   . Other and unspecified angina pectoris   . Atrial fibrillation   . ALLERGIC RHINITIS   . CANCER, COLORECTAL   . CHEST PAIN, ATYPICAL   . DYSPNEA   . FIBROMYALGIA   . G E R D   . INSOMNIA   . History of stress test     Myoview 8/16:  EF 69%, anterior and apical defect c/w breast attenuation; Low Risk     Past Surgical History  Procedure Laterality Date  . Back surgery    . Colon surgery    . Coronary stent placement    . Abdominal hysterectomy    . Breast surgery    . Percutaneous coronary stent intervention (pci-s) N/A 05/19/2013    Procedure: PERCUTANEOUS CORONARY STENT INTERVENTION (PCI-S);  Surgeon: Jettie Booze, MD;  Location: [facility] CATH LAB;  Service: Cardiovascular;  Laterality: N/A;      Current Outpatient Prescriptions  Medication Sig Dispense Refill  . Albuterol Sulfate (PROAIR RESPICLICK) 314 (90 BASE) MCG/ACT AEPB Inhale 1-2 puffs into the lungs as needed (for shortness of breath and/or wheezing.). 1 each 1  . Ascorbic Acid (VITAMIN C) 1000 MG tablet Take 1,000 mg by mouth daily.    . Biotin (BIOTIN MAXIMUM STRENGTH) 5 MG CAPS Take 5 mg by mouth daily.    Marland Kitchen CALCIUM PO Take 1,200 mg by mouth daily.     . cephALEXin (KEFLEX) 500 MG capsule Take 1 capsule (500 mg total) by mouth every 12 (twelve) hours. 6 capsule 0  . clopidogrel (PLAVIX) 75 MG tablet Take 1 tablet (75 mg total) by mouth daily. 90 tablet 3  . DULoxetine (CYMBALTA) 60 MG capsule Take 60 mg by mouth daily.  0  . folic acid (FOLVITE) 1 MG tablet Take 1 mg by mouth daily.  1  . gabapentin (NEURONTIN) 100 MG capsule Take 300 mg by mouth at bedtime.     . isosorbide mononitrate (IMDUR) 30 MG 24 hr tablet Take 1 tablet (30 mg total) by mouth daily. 30 tablet 11  . Melatonin 10 MG TABS Take 10 mg by mouth at bedtime.    . methotrexate (RHEUMATREX) 2.5 MG tablet Take 20 mg by  mouth once a week.   1  . Multiple Vitamin (MULTIVITAMIN WITH MINERALS) TABS Take 1 tablet by mouth daily.    . nitroGLYCERIN (NITROSTAT) 0.4 MG SL tablet Place 1 tablet (0.4 mg total) under the tongue every 5 (five) minutes as needed for chest pain. 90 tablet 3  . pantoprazole (PROTONIX) 40 MG tablet Take 40 mg by mouth daily.    Marland Kitchen PRESCRIPTION MEDICATION Apply 1 drop to eye 2 (two) times daily.    Marland Kitchen SPIRIVA HANDIHALER 18 MCG inhalation capsule Place 18 mcg into inhaler and inhale daily. Use as directed    . zinc gluconate 50 MG tablet Take 50 mg by mouth daily.     No current facility-administered medications for this visit.    Allergies:   Aspirin; Prednisone; Ace inhibitors; Hydroxyquinolines; Imipramine hcl; Pamelor; Procardia; Tramadol; and Plaquenil    Social History:  The patient  reports that she quit smoking about 52 years  ago. Her smoking use included Cigarettes. She started smoking about 65 years ago. She has a 12 pack-year smoking history. She does not have any smokeless tobacco history on file. She reports that she does not drink alcohol or use illicit drugs.   Family History:  The patient's family history includes Bone cancer in her sister; CAD in her brother; Heart attack (age of onset: 98) in her brother; Throat cancer in her daughter.    ROS:  Please see the history of present illness.   Otherwise, review of systems are positive for weakness, bloody nose.   All other systems are reviewed and negative.    PHYSICAL EXAM: VS:  BP 150/62 mmHg  Pulse 69  Ht 5\' 4"  (1.626 m)  Wt 195 lb (88.451 kg)  BMI 33.46 kg/m2  SpO2 97% , BMI Body mass index is 33.46 kg/(m^2). GEN: Well nourished, well developed, in no acute distress HEENT: normal Neck: no JVD, carotid bruits, or masses Cardiac: RRR; 2/6 systolic murmur, no rubs, or gallops,no edema  Respiratory:  clear to auscultation bilaterally, normal work of breathing GI: soft, nontender, nondistended, + BS MS: no deformity or atrophy Skin: warm and dry, no rash Neuro:  Strength and sensation are intact Psych: euthymic mood, full affect      Recent Labs: 07/01/2015: B Natriuretic Peptide 82.0; TSH 3.292 07/02/2015: Magnesium 2.4 07/05/2015: ALT 19 07/06/2015: BUN 9; Creatinine, Ser 1.15*; Hemoglobin 9.4*; Platelets 171; Potassium 3.8; Sodium 133*   Lipid Panel No results found for: CHOL, TRIG, HDL, CHOLHDL, VLDL, LDLCALC, LDLDIRECT   Other studies Reviewed: Additional studies/ records that were reviewed today with results demonstrating: Echo from 2016 reviewed .   ASSESSMENT AND PLAN:  1. Coronary artery disease: Angina controlled with medication. She is allergic to aspirin. She is not wanting clopidogrel. We compromised on clopidogrel every other day for coronary prophylaxis.  Echocardiogram was reassuring with normal left ventricular  function. 2. Cough: She can try over-the-counter Robitussin. 3. Blood pressure medicines were held due to hypotension. Blood pressure is going back up. She needs to follow-up with her primary care doctor. I suspect she will need some blood pressure medicines restarted. 4. Lipids followed by Dr. Alroy Dust.   Current medicines are reviewed at length with the patient today.  The patient concerns regarding her medicines were addressed.  The following changes have been made:  No change   Labs/ tests ordered today include:  No orders of the defined types were placed in this encounter.    Recommend 150 minutes/week of aerobic exercise Low fat,  low carb, high fiber diet recommended  Disposition:   FU in 1 year   Teresita Madura., MD  07/07/2015 11:38 AM    [facility] [address], [place], [place]  [address] Phone: [phone]; Fax: [phone]

## 2015-07-07 NOTE — Patient Instructions (Signed)
Medication Instructions:  1)  START taking Plavix 75mg  every other day  Labwork: None  Testing/Procedures: None  Follow-Up: Your physician wants you to follow-up in: 1 year with Dr. Irish Lack. You will receive a reminder letter in the mail two months in advance. If you don't receive a letter, please call our office to schedule the follow-up appointment.   Any Other Special Instructions Will Be Listed Below (If Applicable).

## 2015-07-21 ENCOUNTER — Encounter: Payer: Self-pay | Admitting: Interventional Cardiology

## 2015-08-15 NOTE — Patient Outreach (Signed)
[facility]) Care Management  08/15/2015  Sheryl Curtis 02-28-1932 [id number]   Referral from [place], assigned to [name], RN for patient outreach.  [name] L. Trish Mancinelli, [place] Care Management Assistant

## 2015-08-18 ENCOUNTER — Encounter (HOSPITAL_BASED_OUTPATIENT_CLINIC_OR_DEPARTMENT_OTHER): Payer: PPO

## 2015-08-21 ENCOUNTER — Encounter: Payer: Self-pay | Admitting: Pulmonary Disease

## 2015-08-21 ENCOUNTER — Ambulatory Visit (INDEPENDENT_AMBULATORY_CARE_PROVIDER_SITE_OTHER): Payer: PPO | Admitting: Pulmonary Disease

## 2015-08-21 VITALS — BP 150/64 | HR 79 | Ht 60.0 in | Wt 200.4 lb

## 2015-08-21 DIAGNOSIS — R918 Other nonspecific abnormal finding of lung field: Secondary | ICD-10-CM

## 2015-08-21 NOTE — Progress Notes (Signed)
   Subjective:    Patient ID: Sheryl Curtis, female    DOB: [birth year], 81 y.o.   MRN: [id number]  HPI  PCP- Donnie Coffin  81 year old remote smoker with severe RA,for FU of pulmonary nodule  She has severe rheumatoid arthritis and takes methotrexate weekly  She has prior history of rectal cancer status post resection in 2007 She underwent stent to LAD in 04/2013. Echo has shown normal LV function without any evidence of diastolic dysfunction.  She was placed on Spiriva in 2011 by my partner, dr Lamonte Sakai, but she stopped this soon after since the did not help. She had been on Advair prior which was stopped. ACE inhibitor was stopped due to cough. Her daughter reports severe dyspnea, she required a handicapped card, she expresses frustration that no one has identified cause of dyspnea.   She quit smoking in 1964 and smoked about 20-pack-years prior.   08/21/2015  Chief Complaint  Patient presents with  . Follow-up    wheezing, DOE, weak.    Adm 06/2015 for UTI /sepsis -reviewed discharge summary  She is accompanied by her daughter Danley Danker joint pains knees - but trying to avoid shots  Dyspnea when walking, c/o freq wheezing, taking symbicort once daily     Significant tests/ events CT abdomen  03/2014 - showed bilateral noncalcified pulmonary nodules, largest measuring 7 mm in the posterior right lower lobe. This was not noted on a prior CT in 2003. chest CT 05/2014 showed scattered nodules all less than 8 mm.  CT chest 01/06/15 - Stable appearance of scattered numerous bilateral pulmonary nodules measuring up to 8 mm  Spirometry in 05/2010 showed FEV1 of 76% improvement postbronchodilator to 80%-1.60 with a ratio of 75 suggesting mild restriction. Spirometry 03/2014 FEV1 of 1.25-64%, with ratio 65 and FVC of 1.91-72%.  PFTs - 12/2014 - no airway obstruction, ratio 71, pos BD response   Review of Systems neg for any significant sore throat, dysphagia, itching, sneezing, nasal  congestion or excess/ purulent secretions, fever, chills, sweats, unintended wt loss, pleuritic or exertional cp, hempoptysis, orthopnea pnd or change in chronic leg swelling. Also denies presyncope, palpitations, heartburn, abdominal pain, nausea, vomiting, diarrhea or change in bowel or urinary habits, dysuria,hematuria, rash, arthralgias, visual complaints, headache, numbness weakness or ataxia.     Objective:   Physical Exam  Gen. Pleasant, obese, in no distress ENT - no lesions, no post nasal drip Neck: No JVD, no thyromegaly, no carotid bruits Lungs: no use of accessory muscles, no dullness to percussion, decreased without rales or rhonchi  Cardiovascular: Rhythm regular, heart sounds  normal, no murmurs or gallops, no peripheral edema Musculoskeletal: No deformities, no cyanosis or clubbing , no tremors       Assessment & Plan:

## 2015-08-21 NOTE — Assessment & Plan Note (Signed)
Take symbicort twice daily Take albuterol 2 puffs as needed for wheezing upto every 6h

## 2015-08-21 NOTE — Patient Instructions (Signed)
Take symbicort twice daily Take albuterol 2 puffs as needed for wheezing upto every 6h Ct scan in 6 months to follow up on nodules

## 2015-08-21 NOTE — Assessment & Plan Note (Signed)
Ct scan in 6 months to follow up on nodules

## 2015-08-22 ENCOUNTER — Other Ambulatory Visit: Payer: Self-pay

## 2015-08-22 NOTE — Patient Outreach (Signed)
[facility]) Care Management  08/22/2015  Sheryl Curtis [birth year] [id number]   Referral Date:  08/15/2015 Referral Source:  [facility] Referral Issue:  HTN and Cancer  Per Epic MR review: H/o Admissions: 1 over past 6 months (Sepsis secondary to urinary tract infection September 2016) and no ED visits. H/o Chronic Diastolic HF, Essential HTN, COPD, Atrial Fibrillation, CKD,   Outreach call #1 to patient for [facility] screening.  Patient not reached.   Plan: RN CM left HIPAA compliant voice message with name and number for call back. RN CM will reschedule for next outreach call within 1-2 weeks.   [name], RN, BSN, [facility], CCM  [employer] Management Coordinator [phone] Direct [phone] Cell [phone] Office [phone] Fax

## 2015-08-25 ENCOUNTER — Other Ambulatory Visit: Payer: Self-pay

## 2015-08-25 NOTE — Patient Outreach (Signed)
[facility]) Care Management  08/25/2015  ECHO PROPP 1931/11/26 [id number]   Referral Date: 08/15/2015 Referral Source: [facility] Referral Issue: HTN and Cancer  Outreach call #2 to patient for [facility] screening. Patient not reached.  Per Epic MR review: H/o Admissions: 1 over past 6 months (Sepsis secondary to urinary tract infection September 2016) and no ED visits. H/o Chronic Diastolic HF, Essential HTN, COPD, Atrial Fibrillation, CKD,   Plan: RN CM left HIPAA compliant voice message with name and number for call back. RN CM will reschedule for next outreach call within 1-2 weeks.   [name], RN, BSN, [facility], CCM  [employer] Management Coordinator [phone] Direct [phone] Cell [phone] Office [phone] Fax

## 2015-08-28 ENCOUNTER — Other Ambulatory Visit: Payer: Self-pay

## 2015-08-28 DIAGNOSIS — I5032 Chronic diastolic (congestive) heart failure: Secondary | ICD-10-CM

## 2015-08-28 NOTE — Patient Outreach (Addendum)
[facility]) Care Management  08/28/2015  Sheryl Curtis Aug 12, 1932 [id number]   Referral Date: 08/15/2015 Referral Source: [facility] Referral Issue: HTN and Cancer Insurance:  [facility]  Outreach call #3 to patient for [facility] screening. Patient reached at [phone] Per Epic MR review: H/o Admissions: 1 over past 6 months (Sepsis secondary to urinary tract infection September 2016) and no ED visits. H/o Chronic Diastolic HF, Essential HTN, COPD, Atrial Fibrillation, CKD,   Social: Patient lives in her home with a daughter, [name] Mobility:  Ambulates with no assistive devices.  Falls:  Yes.  1 fall over the past year relating to hypotension. Caregiver:  Daughter/Betty Transportation:  Daughter goes with patient. Patient or daughter drives.   DME:  Cane, walker (platform),  BSC, BP cuff, scales.  CHF States "doing well with my HF."  Weight:  195-200.  Patient states she is not weighing daily and MD has not ask her to do so.  Patient states she was not aware that she should be weighing with HF.  Flu Vaccine:  NO due to past reaction Pneumonia Vaccine:  Yes   Medications  Patient states less than 10 but RN CM counted 17 on medication list. Denies any issues with cost of medications or getting refills. Prefers using local pharmacy due to past issues with mail order.  Insurance:  [employer] $45.00 medication co-pays.   Consent: Patient gives verbal consent for [facility] services.   Plan.  RN CM sent referral for [place] RN for CHF Education RN CM notified [place] Assistant: agreed to services/case opened. RN CM will schedule for next contact call within 2 weeks. RN CM advised to please notify MD of any changes in condition prior to scheduled appt's.   RN CM provided contact name and # [phone] or main office # [phone] and 24-hour nurse line # [phone].  RN CM confirmed patient is aware of 911 services for urgent emergency  needs.  [name], RN, BSN, [facility], CCM  [employer] Management Coordinator  [phone] Direct [phone] Cell [phone] Office [phone] Fax

## 2015-08-28 NOTE — Patient Outreach (Signed)
[facility]) Care Management  08/28/2015  Sheryl Curtis 10/22/1932 [id number]   Request from [name], RN to assign [place], assigned [name], RN.  Thanks, [name]. [name], [place] Assistant Phone: [phone] Fax: [phone]

## 2015-08-29 ENCOUNTER — Other Ambulatory Visit: Payer: Self-pay

## 2015-08-29 NOTE — Patient Outreach (Signed)
[facility]) Care Management  08/29/2015  JAILEEN JANELLE [birth year] [id number]   Telephone call to patient for initial health coach assessment. No answer.  HIPAA compliant voice message left.    Plan: RN Health Coach will contact patient within 1-2 weeks.   Jone Baseman, RN, MSN [place] [phone]

## 2015-08-31 ENCOUNTER — Other Ambulatory Visit: Payer: Self-pay | Admitting: Interventional Cardiology

## 2015-09-04 ENCOUNTER — Other Ambulatory Visit: Payer: Self-pay

## 2015-09-04 NOTE — Patient Outreach (Signed)
[facility]) Care Management  09/04/2015  LAVINIA MCNEELY Nov 27, 1931 [id number]   Telephone call attempt for Initial Health Coach Assessment.  No answer.  HIPAA compliant voice message left.    Plan: RN Health Coach will attempt within 1-2 weeks.  Jone Baseman, RN, MSN [place] [phone]

## 2015-09-07 ENCOUNTER — Ambulatory Visit: Payer: PPO

## 2015-09-07 ENCOUNTER — Other Ambulatory Visit: Payer: Self-pay

## 2015-09-07 NOTE — Patient Outreach (Signed)
[facility]) Care Management  09/07/2015  Sheryl Curtis May 16, 1932 [id number]   Third telephone call to patient for Initial Health Coach Assessment.  No answer.  HIPAA compliant message left.    Plan: RN will send outreach letter to attempt contact.    Jone Baseman, RN, MSN [place] [phone]

## 2015-09-22 NOTE — Patient Outreach (Signed)
[facility]) Care Management  09/22/2015  HEART SCHREITER [birth year] [id number]   No response from patient after 3 outreach calls and letter.  Plan: RN Health Coach will forward patient information to Lurline Del, Care Management Assistant  for case closure.   RN Health Coach will send closure letter to patient and physician.     Jone Baseman, RN, MSN [place] [phone]

## 2015-11-10 ENCOUNTER — Ambulatory Visit: Payer: PPO | Admitting: Interventional Cardiology

## 2015-12-29 ENCOUNTER — Telehealth: Payer: Self-pay | Admitting: Interventional Cardiology

## 2015-12-29 ENCOUNTER — Telehealth: Payer: Self-pay | Admitting: Pulmonary Disease

## 2015-12-29 NOTE — Telephone Encounter (Signed)
New message      Calling to get clarification on the dosage for plavix. Is it still every other day?

## 2015-12-29 NOTE — Telephone Encounter (Signed)
directions still the same on her plavix.  No answer from pt, either number

## 2015-12-29 NOTE — Telephone Encounter (Signed)
Spoke with pt. She is needing samples of Symbicort. Samples will be left at the front desk for pick up. Nothing further was needed.

## 2016-01-09 ENCOUNTER — Telehealth: Payer: Self-pay | Admitting: Interventional Cardiology

## 2016-01-09 NOTE — Telephone Encounter (Signed)
New message   Pt c/o of Chest Pain: STAT if CP now or developed within 24 hours  1. Are you having CP right now? No   2. Are you experiencing any other symptoms (ex. SOB, nausea, vomiting, sweating)? No / h/o copd   3. How long have you been experiencing CP?  For a couple of months   4. Is your CP continuous or coming and going? Off / on -once awhile   5. Have you taken Nitroglycerin? Yes about 2 weeks ago . Stop  ?

## 2016-01-09 NOTE — Telephone Encounter (Signed)
**Note De-Identified  Obfuscation** The pt c/o CP on and off for a couple of weeks, pain between shoulders that she believes may be from her arthritis (she states she did receive a cortisone injection in her back yesterday for arthritis) and SOB everyday (she does have COPD). She states that she is unsure if these s/s are concerning her heart or not as she has other health issue that could be contributing to her discomfort. She is requesting an apt with Dr Irish Lack. I offered to schedule her an apt to see an APP but she refused stating that she only wants to see Dr Irish Lack. I have scheduled her an apt with Dr Irish Lack on 3/29 which is the soonest I can get her in. The pt is strongly advised to call 911 or have someone drive her to the closest ER if her s/s persist or worsen. She verbalized understanding.  Will forward message to Dr Irish Lack as Juluis Rainier.

## 2016-01-10 NOTE — Telephone Encounter (Signed)
I have called both phone numbers listed in the pts chart and both VM's are full so I cant leave a message. Will continue to call.

## 2016-01-10 NOTE — Telephone Encounter (Signed)
If sx are persisting, we can schedule a cath to check stent patency.  I probably would not repeat a stress test on her.

## 2016-01-12 ENCOUNTER — Telehealth: Payer: Self-pay | Admitting: Interventional Cardiology

## 2016-01-12 NOTE — Telephone Encounter (Signed)
New message     Talk to the nurse about an "issue" she had with her heart wed night.

## 2016-01-12 NOTE — Telephone Encounter (Signed)
Spoke with pt, she had an issue on Wednesday night, she woke up @ 4:30 am with her heart pounding. She got up and took 1 NTG and her heart seemed to calm down. She then developed a pain in the back of her neck and shoulders, she also broke out in a sweat. This discomfort lasted until 6:30 am and then went away. Since that time she has just felt weak, no further chest pounding or pain. She would like an appointment to see someone next week, she does not feel like coming in today. Pt offered appt Tuesday with the PA but she would like to wait until her f/u appt 01-24-16. Patient voiced understanding if this happens again to call 911 or go to the ER.

## 2016-01-15 NOTE — Telephone Encounter (Signed)
**Note De-Identified  Obfuscation** After several attempts today I was able to reach the pt by telephone.  The pt states "I think I may have had a light heart attach last week". When I asked her why she thinks that she had a heart attach she replied that for 3 days last week she was so weak and that her heart woke her up in the middle of the night on one of those nights "racing". She states that she took a NTG X 1 and then she started feeling pain between her shoulder blades but her heart stopped "racing". I asked if she took a second NTG tablet and she stated no because she thought the pain between her shoulders was due to her arthritis. I went over NTG instructions with the pt and she verbalized understanding and is aware that if she has to takes NTG X 2 without relief to call 911 or have someone drive her to the closest ER.    She states that she is no longer having any CP and denies SOB, radiation of pain, diaphoresis and nausea and states that she has been feeling better since this past Thursday 01/11/16. She is in agreement with having a cath if Dr Irish Lack feels she needs one and she does want to keep her up coming apt with him that is scheduled for 3/29. Please advise.

## 2016-01-16 NOTE — Telephone Encounter (Signed)
OK. Will see her on 3/29.  If sx get worse, let us know.

## 2016-01-18 NOTE — Telephone Encounter (Signed)
Informed pt to keep appt for 3/29 and let us know if sx worsen before then. Pt verbalized understanding and was in agreement with this plan.

## 2016-01-24 ENCOUNTER — Encounter: Payer: Self-pay | Admitting: Interventional Cardiology

## 2016-01-24 ENCOUNTER — Ambulatory Visit (INDEPENDENT_AMBULATORY_CARE_PROVIDER_SITE_OTHER): Payer: PPO | Admitting: Interventional Cardiology

## 2016-01-24 VITALS — BP 151/90 | HR 73 | Ht 60.0 in | Wt 205.4 lb

## 2016-01-24 DIAGNOSIS — R002 Palpitations: Secondary | ICD-10-CM

## 2016-01-24 DIAGNOSIS — R0600 Dyspnea, unspecified: Secondary | ICD-10-CM

## 2016-01-24 DIAGNOSIS — I25119 Atherosclerotic heart disease of native coronary artery with unspecified angina pectoris: Secondary | ICD-10-CM | POA: Diagnosis not present

## 2016-01-24 DIAGNOSIS — I10 Essential (primary) hypertension: Secondary | ICD-10-CM

## 2016-01-24 DIAGNOSIS — R0609 Other forms of dyspnea: Secondary | ICD-10-CM | POA: Diagnosis not present

## 2016-01-24 DIAGNOSIS — M7989 Other specified soft tissue disorders: Secondary | ICD-10-CM

## 2016-01-24 MED ORDER — FUROSEMIDE 20 MG PO TABS: 20.0000 mg | ORAL_TABLET | Freq: Every day | ORAL | Status: DC | PRN

## 2016-01-24 NOTE — Patient Instructions (Signed)
Medication Instructions:  1. START LASIX 20 MG DAILY ONLY AS NEEDED FOR EXCESS SWELLING   Labwork: NONE  Testing/Procedures: Your physician has recommended that you wear an 30 DAY event monitor. Event monitors are medical devices that record the heart's electrical activity. Doctors most often [place] these monitors to diagnose arrhythmias. Arrhythmias are problems with the speed or rhythm of the heartbeat. The monitor is a small, portable device. You can wear one while you do your normal daily activities. This is usually used to diagnose what is causing palpitations/syncope (passing out).   Follow-Up: DR. VARANASI IN 2 MONTHS OR WITH NP/PA  Any Other Special Instructions Will Be Listed Below (If Applicable). TRY WEARING SOME OTC COMPRESSION STOCKINGS  If you need a refill on your cardiac medications before your next appointment, please call your pharmacy.

## 2016-01-24 NOTE — Progress Notes (Signed)
Patient ID: Sheryl Curtis, female   DOB: 1932-09-04, 82 y.o.   MRN: [id number]     Cardiology Office Note   Date:  01/24/2016   ID:  Sheryl Curtis, DOB [birth year], MRN [id number]  PCP:  Donnie Coffin, MD    No chief complaint on file. f/u CAD   Wt Readings from Last 3 Encounters:  01/24/16 205 lb 6.4 oz (93.169 kg)  08/28/15 200 lb (90.719 kg)  08/21/15 200 lb 6.4 oz (90.901 kg)       History of Present Illness: Sheryl Curtis is a 82 y.o. female  Who had an LAD stent in 2014.  She has had persistent DOE.  SHe has been valuated by pulmonary.  A fw weeks ago, she felt a rapid heart beat while at [employer] and then had some chest tightness.  It resolved with a SL NTG.  At other times, she has had shortlived sharp chest pain.  A week ago, she woke up with a rapid heart beat and had some tightness in her chest.  Pain went away on his own.  She felt weak after that.  Weakness lasted for 4 days.    She had a negative stress test in August 2016.    Past Medical History  Diagnosis Date  . Essential hypertension, benign   . Other and unspecified angina pectoris   . Atrial fibrillation ([place])   . ALLERGIC RHINITIS   . CANCER, COLORECTAL   . CHEST PAIN, ATYPICAL   . DYSPNEA   . FIBROMYALGIA   . G E R D   . INSOMNIA   . History of stress test     Myoview 8/16:  EF 69%, anterior and apical defect c/w breast attenuation; Low Risk     Past Surgical History  Procedure Laterality Date  . Back surgery    . Colon surgery    . Coronary stent placement    . Abdominal hysterectomy    . Breast surgery    . Percutaneous coronary stent intervention (pci-s) N/A 05/19/2013    Procedure: PERCUTANEOUS CORONARY STENT INTERVENTION (PCI-S);  Surgeon: Jettie Booze, MD;  Location: [facility] CATH LAB;  Service: Cardiovascular;  Laterality: N/A;     Current Outpatient Prescriptions  Medication Sig Dispense Refill  . Albuterol Sulfate (PROAIR RESPICLICK) 123XX123 (90 BASE) MCG/ACT AEPB Inhale 1-2 puffs  into the lungs as needed (for shortness of breath and/or wheezing.). 1 each 1  . Ascorbic Acid (VITAMIN C) 1000 MG tablet Take 1,000 mg by mouth daily.    . Biotin (BIOTIN MAXIMUM STRENGTH) 5 MG CAPS Take 5 mg by mouth daily.    . budesonide-formoterol (SYMBICORT) 160-4.5 MCG/ACT inhaler Inhale 2 puffs into the lungs 2 (two) times daily.    Marland Kitchen CALCIUM PO Take 1,200 mg by mouth daily.     . clopidogrel (PLAVIX) 75 MG tablet Take 1 tablet (75 mg total) by mouth every other day. 45 tablet 3  . DULoxetine (CYMBALTA) 60 MG capsule Take 60 mg by mouth daily.  0  . folic acid (FOLVITE) 1 MG tablet Take 1 mg by mouth daily.  1  . gabapentin (NEURONTIN) 100 MG capsule Take 300 mg by mouth at bedtime.     . isosorbide mononitrate (IMDUR) 30 MG 24 hr tablet Take 1 tablet (30 mg total) by mouth daily. 30 tablet 11  . Melatonin 10 MG TABS Take 10 mg by mouth at bedtime.    . methotrexate (RHEUMATREX) 2.5 MG tablet Take 20 mg  by mouth once a week.   1  . Multiple Vitamin (MULTIVITAMIN WITH MINERALS) TABS Take 1 tablet by mouth daily.    . nitroGLYCERIN (NITROSTAT) 0.4 MG SL tablet Place 1 tablet (0.4 mg total) under the tongue every 5 (five) minutes as needed for chest pain. 90 tablet 3  . pantoprazole (PROTONIX) 40 MG tablet Take 40 mg by mouth daily.    Marland Kitchen PRESCRIPTION MEDICATION Apply 1 drop to eye 2 (two) times daily.    Marland Kitchen zinc gluconate 50 MG tablet Take 50 mg by mouth daily.     No current facility-administered medications for this visit.    Allergies:   Aspirin; Prednisone; Ace inhibitors; Hydroxyquinolines; Imipramine hcl; Pamelor; Procardia; Tramadol; and Plaquenil    Social History:  The patient  reports that she quit smoking about 53 years ago. Her smoking use included Cigarettes. She started smoking about 66 years ago. She has a 12 pack-year smoking history. She does not have any smokeless tobacco history on file. She reports that she does not drink alcohol or use illicit drugs.   Family  History:  The patient's family history includes Bone cancer in her sister; CAD in her brother; Heart attack (age of onset: 62) in her brother; Throat cancer in her daughter.    ROS:  Please see the history of present illness.   Otherwise, review of systems are positive for DOE, fatigue.   All other systems are reviewed and negative.    PHYSICAL EXAM: VS:  BP 151/90 mmHg  Pulse 73  Ht 5' (1.524 m)  Wt 205 lb 6.4 oz (93.169 kg)  BMI 40.11 kg/m2 , BMI Body mass index is 40.11 kg/(m^2). GEN: Well nourished, well developed, in no acute distress HEENT: normal Neck: no JVD, carotid bruits, or masses Cardiac: RRR; no murmurs, rubs, or gallops,no edema  Respiratory:  clear to auscultation bilaterally, normal work of breathing GI: soft, nontender, nondistended, + BS, obese MS: no deformity or atrophy; bilateral pitting edema Skin: warm and dry, no rash Neuro:  Strength and sensation are intact Psych: euthymic mood, full affect   EKG:   The ekg ordered today demonstrates normal sinus rhythm, no significant ST segment changes; mild nonspecific changes   Recent Labs: 07/01/2015: B Natriuretic Peptide 82.0; TSH 3.292 07/02/2015: Magnesium 2.4 07/05/2015: ALT 19 07/06/2015: BUN 9; Creatinine, Ser 1.15*; Hemoglobin 9.4*; Platelets 171; Potassium 3.8; Sodium 133*   Lipid Panel No results found for: CHOL, TRIG, HDL, CHOLHDL, VLDL, LDLCALC, LDLDIRECT   Other studies Reviewed: Additional studies/ records that were reviewed today with results demonstrating: LAD stent placed in 2014. Negative stress test in 2016.   ASSESSMENT AND PLAN:  1. Palpitations: Plan for event monitor. This seems to be the trigger for chest pain. Given her prior coronary artery disease, consider further evaluation with cardiac cath if no etiology for her palpitations can be discovered. The short lived sharp chest pains are unlikely to be cardiac.  2. Lower extremity swelling: I encouraged her to use compression stockings. We'll  also start Lasix 20 mg daily when necessary swelling. She'll try to use this only up to 3 times a week. 3. Shortness of breath: This has been persistent. She has had a pulmonary evaluation. BNP was normal about 6 months ago. Starting Lasix. Hopefully this will improve. If this persists, would consider cardiac cath as well. Recommend daily weights as well.   Current medicines are reviewed at length with the patient today.  The patient concerns regarding her medicines were addressed.  The following changes have been made:  Start Lasix  Labs/ tests ordered today include: Event monitor  No orders of the defined types were placed in this encounter.    Recommend 150 minutes/week of aerobic exercise Low fat, low carb, high fiber diet recommended  Disposition:   FU in 2 months   Teresita Madura., MD  01/24/2016 10:42 AM    [facility] [place], [place], [place]  [address] Phone: [phone]; Fax: [phone]

## 2016-01-25 ENCOUNTER — Ambulatory Visit (INDEPENDENT_AMBULATORY_CARE_PROVIDER_SITE_OTHER): Payer: PPO

## 2016-01-25 DIAGNOSIS — R002 Palpitations: Secondary | ICD-10-CM | POA: Diagnosis not present

## 2016-02-14 IMAGING — CR DG CHEST 1V PORT
1 series · 1 of 1 positions shown · non-contrast
Comparison: 04/02/2014

CLINICAL DATA: SOB, CP, baseline cough. Hx a-fib.

EXAM:
PORTABLE CHEST - 1 VIEW

[AP]
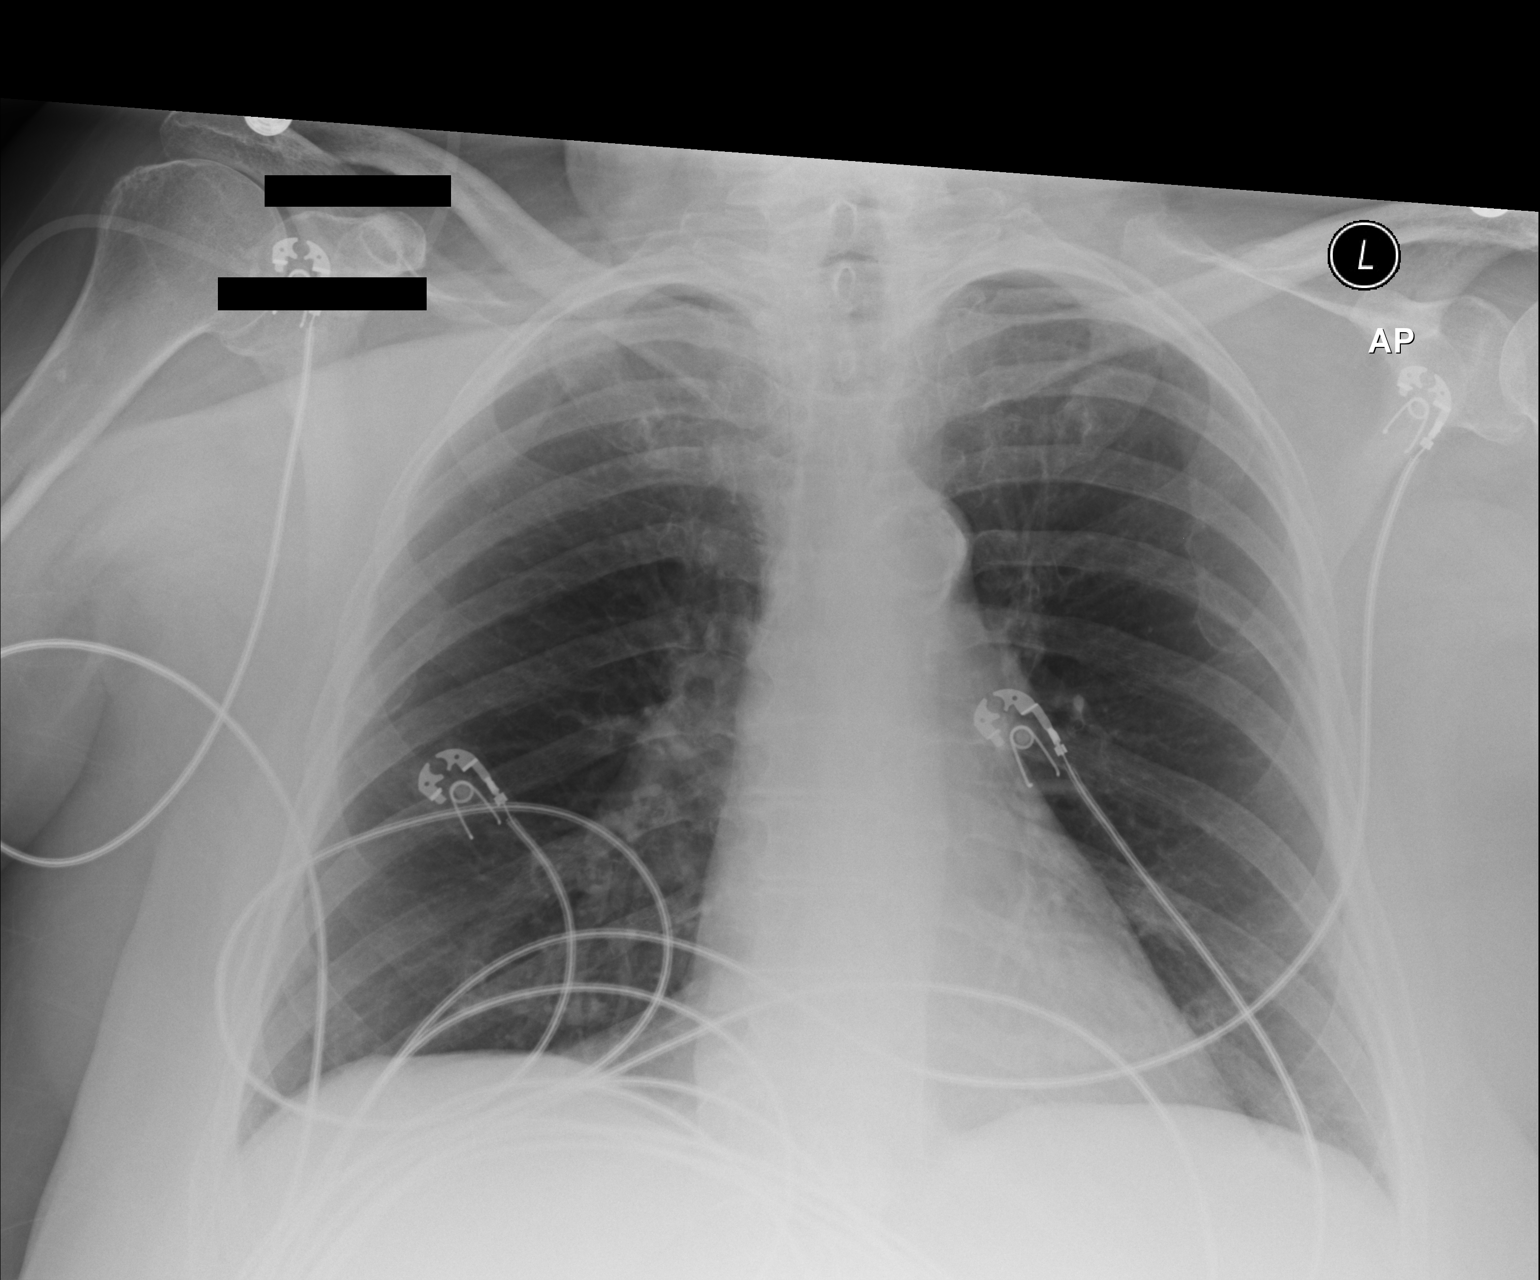

[1 of 1 positions shown; findings below may reference images not displayed]

FINDINGS: The heart size and mediastinal contours are within normal limits.
Both lungs are clear. The visualized skeletal structures are
unremarkable.
IMPRESSION: No active disease.

## 2016-02-19 ENCOUNTER — Ambulatory Visit (INDEPENDENT_AMBULATORY_CARE_PROVIDER_SITE_OTHER): Payer: PPO | Admitting: Adult Health

## 2016-02-19 ENCOUNTER — Ambulatory Visit (INDEPENDENT_AMBULATORY_CARE_PROVIDER_SITE_OTHER)
Admission: RE | Admit: 2016-02-19 | Discharge: 2016-02-19 | Disposition: A | Discharge status: Home / Self Care | Payer: PPO | Source: Ambulatory Visit | Attending: Pulmonary Disease | Admitting: Pulmonary Disease

## 2016-02-19 ENCOUNTER — Encounter: Payer: Self-pay | Admitting: Adult Health

## 2016-02-19 VITALS — BP 130/64 | HR 66 | Temp 97.6°F | Ht 63.0 in | Wt 202.0 lb

## 2016-02-19 DIAGNOSIS — R918 Other nonspecific abnormal finding of lung field: Secondary | ICD-10-CM | POA: Diagnosis not present

## 2016-02-19 DIAGNOSIS — J45909 Unspecified asthma, uncomplicated: Secondary | ICD-10-CM | POA: Insufficient documentation

## 2016-02-19 DIAGNOSIS — J4530 Mild persistent asthma, uncomplicated: Secondary | ICD-10-CM | POA: Diagnosis not present

## 2016-02-19 NOTE — Assessment & Plan Note (Signed)
Compensated on present regimen  Plan  Continue on Symbicort 2 puffs Twice daily  , rinse after use.  Follow up Dr. Elsworth Soho in 6 months and As needed   We are setting you up for CT chest to follow lung nodules  In 1 year April 2018

## 2016-02-19 NOTE — Progress Notes (Signed)
Subjective:    Patient ID: Sheryl Curtis, female    DOB: February 24, 1932, 82 y.o.   MRN: [id number]  HPI 82 year old remote smoker with severe RA,for FU of pulmonary nodule  She has severe rheumatoid arthritis with previous MTX use.   She has prior history of rectal cancer status post resection in 2007 She underwent stent to LAD in 04/2013.  Echo has shown normal LV function without any evidence of diastolic dysfunction.       Significant tests/ events CT abdomen  03/2014 - showed bilateral noncalcified pulmonary nodules, largest measuring 7 mm in the posterior right lower lobe. This was not noted on a prior CT in 2003. She was placed on Spiriva in 2011  chest CT 05/2014 showed scattered nodules all less than 8 mm. ACE inhibitor stopped in past for cough  CT chest 01/06/15 - Stable appearance of scattered numerous bilateral pulmonary nodules measuring up to 8 mm  Spirometry in 05/2010 showed FEV1 of 76% improvement postbronchodilator to 80%-1.60 with a ratio of 75 suggesting mild restriction. Spirometry 03/2014 FEV1 of 1.25-64%, with ratio 65 and FVC of 1.91-72%.  PFTs - 12/2014 - no airway obstruction, ratio 71, pos BD response 06/2015 Admit for UTI and Sepsis    02/19/2016 Follow up : Asthma  and Lung nodule  Pt returns for a 6 month follow up .  Says she is doing better . Took her a while to regain her strength from hospital  Stay in 06/2015.  Gets winded with activity. No increased cough . SOB is at baseline.  She remains on Symbicort Twice daily  .  She has known lung nodule that were stable in 12/2014 . She has serial follow up CT chest this am that showed previous measured noudles unchanged from 05/2014 . There were new nodule RLL at 56mm . We discussed repeat CT chest in 1 year.  She denies chest pain, orthopnea, edema or fever.      Past Medical History  Diagnosis Date  . Essential hypertension, benign   . Other and unspecified angina pectoris   . Atrial fibrillation ([place])   .  ALLERGIC RHINITIS   . CANCER, COLORECTAL   . CHEST PAIN, ATYPICAL   . DYSPNEA   . FIBROMYALGIA   . G E R D   . INSOMNIA   . History of stress test     Myoview 8/16:  EF 69%, anterior and apical defect c/w breast attenuation; Low Risk    Current Outpatient Prescriptions on File Prior to Visit  Medication Sig Dispense Refill  . Albuterol Sulfate (PROAIR RESPICLICK) 123XX123 (90 BASE) MCG/ACT AEPB Inhale 1-2 puffs into the lungs as needed (for shortness of breath and/or wheezing.). 1 each 1  . Ascorbic Acid (VITAMIN C) 1000 MG tablet Take 1,000 mg by mouth daily.    . Biotin (BIOTIN MAXIMUM STRENGTH) 5 MG CAPS Take 5 mg by mouth daily.    . budesonide-formoterol (SYMBICORT) 160-4.5 MCG/ACT inhaler Inhale 2 puffs into the lungs 2 (two) times daily.    Marland Kitchen CALCIUM PO Take 1,200 mg by mouth daily.     . clopidogrel (PLAVIX) 75 MG tablet Take 1 tablet (75 mg total) by mouth every other day. 45 tablet 3  . DULoxetine (CYMBALTA) 60 MG capsule Take 60 mg by mouth daily.  0  . folic acid (FOLVITE) 1 MG tablet Take 1 mg by mouth daily.  1  . furosemide (LASIX) 20 MG tablet Take 1 tablet (20 mg total) by mouth  daily as needed for fluid or edema. 30 tablet 6  . gabapentin (NEURONTIN) 300 MG capsule Take 300 mg by mouth 2 (two) times daily. Reported on 01/24/2016    . HYDROcodone-acetaminophen (NORCO) 10-325 MG tablet Take 1-2 tablets by mouth every 4 (four) hours as needed for severe pain.     . isosorbide mononitrate (IMDUR) 30 MG 24 hr tablet Take 1 tablet (30 mg total) by mouth daily. 30 tablet 11  . leflunomide (ARAVA) 20 MG tablet Take 20 mg by mouth daily.     . Melatonin 10 MG TABS Take 10 mg by mouth at bedtime.    . methotrexate (RHEUMATREX) 2.5 MG tablet Take 20 mg by mouth once a week.   1  . Multiple Vitamin (MULTIVITAMIN WITH MINERALS) TABS Take 1 tablet by mouth daily.    . nitroGLYCERIN (NITROSTAT) 0.4 MG SL tablet Place 1 tablet (0.4 mg total) under the tongue every 5 (five) minutes as needed  for chest pain. 90 tablet 3  . pantoprazole (PROTONIX) 40 MG tablet Take 40 mg by mouth daily.    Marland Kitchen PRESCRIPTION MEDICATION Apply 1 drop to eye 2 (two) times daily.    Marland Kitchen zinc gluconate 50 MG tablet Take 50 mg by mouth daily.     No current facility-administered medications on file prior to visit.   Past Medical History  Diagnosis Date  . Essential hypertension, benign   . Other and unspecified angina pectoris   . Atrial fibrillation ([place])   . ALLERGIC RHINITIS   . CANCER, COLORECTAL   . CHEST PAIN, ATYPICAL   . DYSPNEA   . FIBROMYALGIA   . G E R D   . INSOMNIA   . History of stress test     Myoview 8/16:  EF 69%, anterior and apical defect c/w breast attenuation; Low Risk    Current Outpatient Prescriptions on File Prior to Visit  Medication Sig Dispense Refill  . Albuterol Sulfate (PROAIR RESPICLICK) 123XX123 (90 BASE) MCG/ACT AEPB Inhale 1-2 puffs into the lungs as needed (for shortness of breath and/or wheezing.). 1 each 1  . Ascorbic Acid (VITAMIN C) 1000 MG tablet Take 1,000 mg by mouth daily.    . Biotin (BIOTIN MAXIMUM STRENGTH) 5 MG CAPS Take 5 mg by mouth daily.    . budesonide-formoterol (SYMBICORT) 160-4.5 MCG/ACT inhaler Inhale 2 puffs into the lungs 2 (two) times daily.    Marland Kitchen CALCIUM PO Take 1,200 mg by mouth daily.     . clopidogrel (PLAVIX) 75 MG tablet Take 1 tablet (75 mg total) by mouth every other day. 45 tablet 3  . DULoxetine (CYMBALTA) 60 MG capsule Take 60 mg by mouth daily.  0  . folic acid (FOLVITE) 1 MG tablet Take 1 mg by mouth daily.  1  . furosemide (LASIX) 20 MG tablet Take 1 tablet (20 mg total) by mouth daily as needed for fluid or edema. 30 tablet 6  . gabapentin (NEURONTIN) 300 MG capsule Take 300 mg by mouth 2 (two) times daily. Reported on 01/24/2016    . HYDROcodone-acetaminophen (NORCO) 10-325 MG tablet Take 1-2 tablets by mouth every 4 (four) hours as needed for severe pain.     . isosorbide mononitrate (IMDUR) 30 MG 24 hr tablet Take 1 tablet (30 mg  total) by mouth daily. 30 tablet 11  . leflunomide (ARAVA) 20 MG tablet Take 20 mg by mouth daily.     . Melatonin 10 MG TABS Take 10 mg by mouth at bedtime.    Marland Kitchen  methotrexate (RHEUMATREX) 2.5 MG tablet Take 20 mg by mouth once a week.   1  . Multiple Vitamin (MULTIVITAMIN WITH MINERALS) TABS Take 1 tablet by mouth daily.    . nitroGLYCERIN (NITROSTAT) 0.4 MG SL tablet Place 1 tablet (0.4 mg total) under the tongue every 5 (five) minutes as needed for chest pain. 90 tablet 3  . pantoprazole (PROTONIX) 40 MG tablet Take 40 mg by mouth daily.    Marland Kitchen PRESCRIPTION MEDICATION Apply 1 drop to eye 2 (two) times daily.    Marland Kitchen zinc gluconate 50 MG tablet Take 50 mg by mouth daily.     No current facility-administered medications on file prior to visit.      Review of Systems Constitutional:   No  weight loss, night sweats,  Fevers, chills +, fatigue, or  lassitude.  HEENT:   No headaches,  Difficulty swallowing,  Tooth/dental problems, or  Sore throat,                No sneezing, itching, ear ache, nasal congestion, post nasal drip,   CV:  No chest pain,  Orthopnea, PND, swelling in lower extremities, anasarca, dizziness, palpitations, syncope.   GI  No heartburn, indigestion, abdominal pain, nausea, vomiting, diarrhea, change in bowel habits, loss of appetite, bloody stools.   Resp:   No chest wall deformity  Skin: no rash or lesions.  GU: no dysuria, change in color of urine, no urgency or frequency.  No flank pain, no hematuria   MS:  No joint pain or swelling.  No decreased range of motion.  No back pain.  Psych:  No change in mood or affect. No depression or anxiety.  No memory loss.         Objective:   Physical Exam  Filed Vitals:   02/19/16 1112  BP: 130/64  Pulse: 66  Temp: 97.6 F (36.4 C)  TempSrc: Oral  Height: 5\' 3"  (1.6 m)  Weight: 202 lb (91.627 kg)  SpO2: 94%   GEN: A/Ox3; pleasant , NAD,elderly and obese   HEENT:  [place]/AT,  EACs-clear, TMs-wnl, NOSE-clear,  THROAT-clear, no lesions, no postnasal drip or exudate noted.   NECK:  Supple w/ fair ROM; no JVD; normal carotid impulses w/o bruits; no thyromegaly or nodules palpated; no lymphadenopathy.  RESP  Decreased BS in bases .no accessory muscle use, no dullness to percussion  CARD:  RRR, no m/r/g  , tr  peripheral edema, pulses intact, no cyanosis or clubbing.  GI:   Soft & nt; nml bowel sounds; no organomegaly or masses detected.  Musco: Warm bil, no deformities or joint swelling noted.   Neuro: alert, no focal deficits noted.    Skin: Warm, no lesions or rashes   CT chest 02/19/2016 reviewed independently  The previously measured nodules are unchanged when compared with 06/13/2014. The largest nodule is in the right lower lobe and has a mean diameter of 8 mm. 2. There is a new or emerging nodule in the right lower lobe which measures 4 mm   Navi Ewton NP-C  [facility]  02/19/2016       Assessment & Plan:

## 2016-02-19 NOTE — Patient Instructions (Addendum)
Continue on Symbicort 2 puffs Twice daily  , rinse after use.  Follow up Dr. Elsworth Soho in 6 months and As needed   We are setting you up for CT chest to follow lung nodules  In 1 year April 2018

## 2016-02-19 NOTE — Assessment & Plan Note (Signed)
Stable nodules except for new RLL nodule at 20mm  Will need CT chest in 1 yr.

## 2016-02-22 NOTE — Progress Notes (Signed)
Reviewed & agree with plan  

## 2016-02-28 ENCOUNTER — Ambulatory Visit: Payer: PPO | Admitting: Interventional Cardiology

## 2016-03-27 ENCOUNTER — Other Ambulatory Visit: Payer: Self-pay | Admitting: Physician Assistant

## 2016-03-27 DIAGNOSIS — M5416 Radiculopathy, lumbar region: Secondary | ICD-10-CM

## 2016-03-28 ENCOUNTER — Ambulatory Visit (INDEPENDENT_AMBULATORY_CARE_PROVIDER_SITE_OTHER): Payer: PPO | Admitting: Interventional Cardiology

## 2016-03-28 ENCOUNTER — Encounter: Payer: Self-pay | Admitting: Interventional Cardiology

## 2016-03-28 VITALS — BP 130/60 | HR 98 | Ht 63.0 in | Wt 195.0 lb

## 2016-03-28 DIAGNOSIS — I2510 Atherosclerotic heart disease of native coronary artery without angina pectoris: Secondary | ICD-10-CM

## 2016-03-28 DIAGNOSIS — R0600 Dyspnea, unspecified: Secondary | ICD-10-CM

## 2016-03-28 DIAGNOSIS — R0609 Other forms of dyspnea: Secondary | ICD-10-CM | POA: Diagnosis not present

## 2016-03-28 NOTE — Patient Instructions (Signed)
Medication Instructions:  Same-no changes  Labwork: None  Testing/Procedures: None  Follow-Up: Your physician wants you to follow-up in: 6 months. You will receive a reminder letter in the mail two months in advance. If you don't receive a letter, please call our office to schedule the follow-up appointment.      If you need a refill on your cardiac medications before your next appointment, please call your pharmacy.   

## 2016-03-28 NOTE — Progress Notes (Signed)
Cardiology Office Note   Date:  03/28/2016   ID:  Sheryl Curtis, DOB Dec 08, 1931, MRN [id number]  PCP:  Donnie Coffin, MD    No chief complaint on file.  weakness  Wt Readings from Last 3 Encounters:  03/28/16 195 lb (88.451 kg)  02/19/16 202 lb (91.627 kg)  01/24/16 205 lb 6.4 oz (93.169 kg)       History of Present Illness: Sheryl Curtis is a 82 y.o. female  Who has had CAD and an LAD stent in 2014.  She has not taken any NTG recently.  Monitor was negative.    THe chest pain that she had at her last visit has resolved.  TH epalpitations have resolved.  SH eremains SHOB with exerton.  SHe does not do much in the way of exercise.    She has had soe diarrhea over the past few days,  SHe has not been drinking much water of late.      Past Medical History  Diagnosis Date  . Essential hypertension, benign   . Other and unspecified angina pectoris   . Atrial fibrillation ([place])   . ALLERGIC RHINITIS   . CANCER, COLORECTAL   . CHEST PAIN, ATYPICAL   . DYSPNEA   . FIBROMYALGIA   . G E R D   . INSOMNIA   . History of stress test     Myoview 8/16:  EF 69%, anterior and apical defect c/w breast attenuation; Low Risk     Past Surgical History  Procedure Laterality Date  . Back surgery    . Colon surgery    . Coronary stent placement    . Abdominal hysterectomy    . Breast surgery    . Percutaneous coronary stent intervention (pci-s) N/A 05/19/2013    Procedure: PERCUTANEOUS CORONARY STENT INTERVENTION (PCI-S);  Surgeon: Jettie Booze, MD;  Location: [facility] CATH LAB;  Service: Cardiovascular;  Laterality: N/A;     Current Outpatient Prescriptions  Medication Sig Dispense Refill  . Albuterol Sulfate (PROAIR RESPICLICK) 123XX123 (90 BASE) MCG/ACT AEPB Inhale 1-2 puffs into the lungs as needed (for shortness of breath and/or wheezing.). 1 each 1  . Ascorbic Acid (VITAMIN C) 1000 MG tablet Take 1,000 mg by mouth daily.    . Biotin (BIOTIN MAXIMUM STRENGTH) 5 MG CAPS Take 5  mg by mouth daily.    . budesonide-formoterol (SYMBICORT) 160-4.5 MCG/ACT inhaler Inhale 2 puffs into the lungs 2 (two) times daily.    Marland Kitchen CALCIUM PO Take 1,200 mg by mouth daily.     . clopidogrel (PLAVIX) 75 MG tablet Take 1 tablet (75 mg total) by mouth every other day. 45 tablet 3  . DULoxetine (CYMBALTA) 60 MG capsule Take 60 mg by mouth daily.  0  . folic acid (FOLVITE) 1 MG tablet Take 1 mg by mouth daily.  1  . gabapentin (NEURONTIN) 300 MG capsule Take 300 mg by mouth 2 (two) times daily. Reported on 01/24/2016    . HYDROcodone-acetaminophen (NORCO) 10-325 MG tablet Take 1-2 tablets by mouth every 4 (four) hours as needed for severe pain.     . isosorbide mononitrate (IMDUR) 30 MG 24 hr tablet Take 1 tablet (30 mg total) by mouth daily. 30 tablet 11  . Multiple Vitamin (MULTIVITAMIN WITH MINERALS) TABS Take 1 tablet by mouth daily.    . nitroGLYCERIN (NITROSTAT) 0.4 MG SL tablet Place 1 tablet (0.4 mg total) under the tongue every 5 (five) minutes as needed for chest pain.  90 tablet 3  . PRESCRIPTION MEDICATION Apply 1 drop to eye 2 (two) times daily.    . valsartan-hydrochlorothiazide (DIOVAN-HCT) 160-25 MG tablet Take 1 tablet by mouth daily.     No current facility-administered medications for this visit.    Allergies:   Aspirin; Hydroxyquinolines; Imipramine hcl; Pamelor; Prednisone; Procardia; Tramadol; Ace inhibitors; and Plaquenil    Social History:  The patient  reports that she quit smoking about 53 years ago. Her smoking use included Cigarettes. She started smoking about 66 years ago. She has a 12 pack-year smoking history. She does not have any smokeless tobacco history on file. She reports that she does not drink alcohol or use illicit drugs.   Family History:  The patient's family history includes Bone cancer in her sister; CAD in her brother; Heart attack (age of onset: 64) in her brother; Throat cancer in her daughter.    ROS:  Please see the history of present illness.    Otherwise, review of systems are positive for weakness, diarhea.   All other systems are reviewed and negative.    PHYSICAL EXAM: VS:  BP 130/60 mmHg  Pulse 98  Ht 5\' 3"  (1.6 m)  Wt 195 lb (88.451 kg)  BMI 34.55 kg/m2 , BMI Body mass index is 34.55 kg/(m^2). GEN: Well nourished, well developed, in no acute distress HEENT: normal Neck: no JVD, carotid bruits, or masses Cardiac: RRR; no murmurs, rubs, or gallops,no edema  Respiratory:  clear to auscultation bilaterally, normal work of breathing GI: soft, nontender, nondistended, + BS MS: no deformity or atrophy Skin: warm and dry, no rash Neuro:  Strength and sensation are intact Psych: euthymic mood, full affect   Recent Labs: 07/01/2015: B Natriuretic Peptide 82.0; TSH 3.292 07/02/2015: Magnesium 2.4 07/05/2015: ALT 19 07/06/2015: BUN 9; Creatinine, Ser 1.15*; Hemoglobin 9.4*; Platelets 171; Potassium 3.8; Sodium 133*   Lipid Panel No results found for: CHOL, TRIG, HDL, CHOLHDL, VLDL, LDLCALC, LDLDIRECT   Other studies Reviewed: Additional studies/ records that were reviewed today with results demonstrating: prior cath records.   ASSESSMENT AND PLAN:  1. CAD: s/p LAD stnet a few years ago.  No clear angina.  We discussed repeat cath but at this point given the [facility] and weakness, she is not interested.  THis is reasonable.  If sx get worse, she will let us know.   2. SHOB: EF has been normal with normal BNP.  Likely multifactorial.  3. Encouraged her to drink some gatorade to replace electrolytes while she has diarrhea.     Current medicines are reviewed at length with the patient today.  The patient concerns regarding her medicines were addressed.  The following changes have been made:  No change  Labs/ tests ordered today include:  No orders of the defined types were placed in this encounter.    Recommend 150 minutes/week of aerobic exercise Low fat, low carb, high fiber diet recommended  Disposition:   FU in 6  months   Signed, Larae Grooms, MD  03/28/2016 3:07 PM    [facility] [place], [place], [place]  [address] Phone: [phone]; Fax: [phone]

## 2016-03-30 ENCOUNTER — Telehealth: Payer: Self-pay | Admitting: Internal Medicine

## 2016-03-30 NOTE — Telephone Encounter (Signed)
Sick since 03/28/16 with severe cough and sob at rest, no better with saba > rec go to ER

## 2016-04-01 NOTE — Telephone Encounter (Signed)
Please call this patient and arrange for office visit with NP

## 2016-04-04 ENCOUNTER — Ambulatory Visit (INDEPENDENT_AMBULATORY_CARE_PROVIDER_SITE_OTHER): Payer: PPO | Admitting: Internal Medicine

## 2016-04-04 ENCOUNTER — Encounter: Payer: Self-pay | Admitting: Internal Medicine

## 2016-04-04 ENCOUNTER — Telehealth: Payer: Self-pay | Admitting: Pulmonary Disease

## 2016-04-04 VITALS — BP 126/74 | HR 87 | Ht 63.0 in | Wt 195.6 lb

## 2016-04-04 DIAGNOSIS — J209 Acute bronchitis, unspecified: Secondary | ICD-10-CM

## 2016-04-04 DIAGNOSIS — J45901 Unspecified asthma with (acute) exacerbation: Secondary | ICD-10-CM | POA: Diagnosis not present

## 2016-04-04 DIAGNOSIS — I480 Paroxysmal atrial fibrillation: Secondary | ICD-10-CM | POA: Diagnosis not present

## 2016-04-04 DIAGNOSIS — J45909 Unspecified asthma, uncomplicated: Secondary | ICD-10-CM

## 2016-04-04 MED ORDER — AMOXICILLIN 500 MG PO TABS: 500.0000 mg | ORAL_TABLET | Freq: Two times a day (BID) | ORAL | Status: DC

## 2016-04-04 MED ORDER — LEVALBUTEROL HCL 0.63 MG/3ML IN NEBU
0.6300 mg | INHALATION_SOLUTION | Freq: Once | RESPIRATORY_TRACT | Status: AC
  Administered 2016-04-04: 0.63 mg via RESPIRATORY_TRACT

## 2016-04-04 MED ORDER — PREDNISONE 10 MG PO TABS: ORAL_TABLET | ORAL | Status: DC

## 2016-04-04 MED ORDER — ALBUTEROL SULFATE (2.5 MG/3ML) 0.083% IN NEBU: 2.5000 mg | INHALATION_SOLUTION | Freq: Four times a day (QID) | RESPIRATORY_TRACT | Status: DC | PRN

## 2016-04-04 MED ORDER — COMPRESSOR NEBULIZER MISC: 1.0000 | Freq: Once | Status: AC

## 2016-04-04 MED ORDER — METHYLPREDNISOLONE ACETATE 80 MG/ML IJ SUSP
80.0000 mg | Freq: Once | INTRAMUSCULAR | Status: AC
  Administered 2016-04-04: 80 mg via INTRAMUSCULAR

## 2016-04-04 NOTE — Progress Notes (Signed)
Subjective:    Patient ID: Sheryl Curtis, female    DOB: 1931/12/17, 82 y.o.   MRN: [id number]  HPI 82 year old remote smoker with severe RA,for FU of pulmonary nodule  She has severe rheumatoid arthritis with previous MTX use.   She has prior history of rectal cancer status post resection in 2007 She underwent stent to LAD in 04/2013.  Echo has shown normal LV function without any evidence of diastolic dysfunction.    Significant tests/ events CT abdomen  03/2014 - showed bilateral noncalcified pulmonary nodules, largest measuring 7 mm in the posterior right lower lobe. This was not noted on a prior CT in 2003. She was placed on Spiriva in 2011  chest CT 05/2014 showed scattered nodules all less than 8 mm. ACE inhibitor stopped in past for cough  CT chest 01/06/15 - Stable appearance of scattered numerous bilateral pulmonary nodules measuring up to 8 mm  Spirometry in 05/2010 showed FEV1 of 76% improvement postbronchodilator to 80%-1.60 with a ratio of 75 suggesting mild restriction. Spirometry 03/2014 FEV1 of 1.25-64%, with ratio 65 and FVC of 1.91-72%.  PFTs - 12/2014 - no airway obstruction, ratio 71, pos BD response 06/2015 Admit for UTI and Sepsis   02/19/16 Follow up : Asthma  and Lung nodule  Pt returns for a 6 month follow up .  Says she is doing better . Took her a while to regain her strength from hospital  Stay in 06/2015.  Gets winded with activity. No increased cough . SOB is at baseline.  She remains on Symbicort Twice daily  .  She has known lung nodule that were stable in 12/2014 . She has serial follow up CT chest this am that showed previous measured noudles unchanged from 05/2014 . There were new nodule RLL at 29mm . We discussed repeat CT chest in 1 year.  She denies chest pain, orthopnea, edema or fever.   04/04/2016-ACUTE VISIT: RA patient here for Increased SOB and wheezing-started last Thursday and getting worse.  Here with daughter. Viral pattern infection in the  family. Reports one week of chest congestion, wheeze, cough productive yellow mucus No chills, adenopathy, GI upset They are asking to have a nebulizer machine for the home and for standby antibiotic. These were discussed.  Past Medical History  Diagnosis Date  . Essential hypertension, benign   . Other and unspecified angina pectoris   . Atrial fibrillation ([place])   . ALLERGIC RHINITIS   . CANCER, COLORECTAL   . CHEST PAIN, ATYPICAL   . DYSPNEA   . FIBROMYALGIA   . G E R D   . INSOMNIA   . History of stress test     Myoview 8/16:  EF 69%, anterior and apical defect c/w breast attenuation; Low Risk    Current Outpatient Prescriptions on File Prior to Visit  Medication Sig Dispense Refill  . Albuterol Sulfate (PROAIR RESPICLICK) 123XX123 (90 BASE) MCG/ACT AEPB Inhale 1-2 puffs into the lungs as needed (for shortness of breath and/or wheezing.). 1 each 1  . Ascorbic Acid (VITAMIN C) 1000 MG tablet Take 1,000 mg by mouth daily.    . Biotin (BIOTIN MAXIMUM STRENGTH) 5 MG CAPS Take 5 mg by mouth daily.    . budesonide-formoterol (SYMBICORT) 160-4.5 MCG/ACT inhaler Inhale 2 puffs into the lungs 2 (two) times daily.    Marland Kitchen CALCIUM PO Take 1,200 mg by mouth daily.     . clopidogrel (PLAVIX) 75 MG tablet Take 1 tablet (75 mg total) by  mouth every other day. 45 tablet 3  . DULoxetine (CYMBALTA) 60 MG capsule Take 60 mg by mouth daily.  0  . folic acid (FOLVITE) 1 MG tablet Take 1 mg by mouth daily.  1  . gabapentin (NEURONTIN) 300 MG capsule Take 300 mg by mouth 2 (two) times daily. Reported on 01/24/2016    . isosorbide mononitrate (IMDUR) 30 MG 24 hr tablet Take 1 tablet (30 mg total) by mouth daily. 30 tablet 11  . Multiple Vitamin (MULTIVITAMIN WITH MINERALS) TABS Take 1 tablet by mouth daily.    . nitroGLYCERIN (NITROSTAT) 0.4 MG SL tablet Place 1 tablet (0.4 mg total) under the tongue every 5 (five) minutes as needed for chest pain. 90 tablet 3  . PRESCRIPTION MEDICATION Apply 1 drop to eye 2  (two) times daily.    . valsartan-hydrochlorothiazide (DIOVAN-HCT) 160-25 MG tablet Take 1 tablet by mouth daily.     No current facility-administered medications on file prior to visit.   Past Medical History  Diagnosis Date  . Essential hypertension, benign   . Other and unspecified angina pectoris   . Atrial fibrillation ([place])   . ALLERGIC RHINITIS   . CANCER, COLORECTAL   . CHEST PAIN, ATYPICAL   . DYSPNEA   . FIBROMYALGIA   . G E R D   . INSOMNIA   . History of stress test     Myoview 8/16:  EF 69%, anterior and apical defect c/w breast attenuation; Low Risk    Current Outpatient Prescriptions on File Prior to Visit  Medication Sig Dispense Refill  . Albuterol Sulfate (PROAIR RESPICLICK) 123XX123 (90 BASE) MCG/ACT AEPB Inhale 1-2 puffs into the lungs as needed (for shortness of breath and/or wheezing.). 1 each 1  . Ascorbic Acid (VITAMIN C) 1000 MG tablet Take 1,000 mg by mouth daily.    . Biotin (BIOTIN MAXIMUM STRENGTH) 5 MG CAPS Take 5 mg by mouth daily.    . budesonide-formoterol (SYMBICORT) 160-4.5 MCG/ACT inhaler Inhale 2 puffs into the lungs 2 (two) times daily.    Marland Kitchen CALCIUM PO Take 1,200 mg by mouth daily.     . clopidogrel (PLAVIX) 75 MG tablet Take 1 tablet (75 mg total) by mouth every other day. 45 tablet 3  . DULoxetine (CYMBALTA) 60 MG capsule Take 60 mg by mouth daily.  0  . folic acid (FOLVITE) 1 MG tablet Take 1 mg by mouth daily.  1  . gabapentin (NEURONTIN) 300 MG capsule Take 300 mg by mouth 2 (two) times daily. Reported on 01/24/2016    . isosorbide mononitrate (IMDUR) 30 MG 24 hr tablet Take 1 tablet (30 mg total) by mouth daily. 30 tablet 11  . Multiple Vitamin (MULTIVITAMIN WITH MINERALS) TABS Take 1 tablet by mouth daily.    . nitroGLYCERIN (NITROSTAT) 0.4 MG SL tablet Place 1 tablet (0.4 mg total) under the tongue every 5 (five) minutes as needed for chest pain. 90 tablet 3  . PRESCRIPTION MEDICATION Apply 1 drop to eye 2 (two) times daily.    .  valsartan-hydrochlorothiazide (DIOVAN-HCT) 160-25 MG tablet Take 1 tablet by mouth daily.     No current facility-administered medications on file prior to visit.   Review of Systems Constitutional:   No  weight loss, night sweats,  Fevers, chills +, fatigue, or  lassitude.  HEENT:   No headaches,  Difficulty swallowing,  Tooth/dental problems, or  Sore throat,  No sneezing, itching, ear ache, nasal congestion, post nasal drip,   CV:  No chest pain,  Orthopnea, PND, swelling in lower extremities, anasarca, dizziness, palpitations, syncope.   GI  No heartburn, indigestion, abdominal pain, nausea, vomiting, diarrhea, change in bowel habits, loss of appetite, bloody stools.   Resp:   No chest wall deformity  Skin: no rash or lesions.  GU: no dysuria, change in color of urine, no urgency or frequency.  No flank pain, no hematuria   MS:  No joint pain or swelling.  No decreased range of motion.  No back pain.  Psych:  No change in mood or affect. No depression or anxiety.  No memory loss.         Objective:   Physical Exam  Filed Vitals:   04/04/16 1206  BP: 126/74  Pulse: 87  Height: 5\' 3"  (1.6 m)  Weight: 195 lb 9.6 oz (88.724 kg)  SpO2: 96%   GEN: A/Ox3; pleasant , NAD,elderly and obese   HEENT:  [place]/AT,  EACs-clear, TMs-wnl, NOSE-clear, THROAT-clear, no lesions, no postnasal drip or exudate noted.   NECK:  Supple w/ fair ROM; no JVD; normal carotid impulses w/o bruits; no thyromegaly or nodules palpated; no lymphadenopathy.  RESP: + Bilateral inspiratory and expiratory wheeze, unlabored  CARD:  RRR-pulse feels regular to exam, no m/r/g  , tr  peripheral edema, pulses intact, no cyanosis or clubbing.  GI:   Soft & nt; nml bowel sounds; no organomegaly or masses detected.  Musco: Warm bil, no deformities or joint swelling noted.   Neuro: alert, no focal deficits noted.    Skin: Warm, no lesions or rash      Assessment & Plan:

## 2016-04-04 NOTE — Patient Instructions (Addendum)
Neb xop 0.63    Dx acute exacerbation asthma  Depo 80  Script sent for prednisone taper  Order- Scripts printed for nebulizer machine and albuterol neb solution through a DME company  Script printed for amoxacillin in case this becomes more like a bacterial infection with more green, etc, as discussed  Please make appointment to see Dr Elsworth Soho for follow-up- next available

## 2016-04-04 NOTE — Telephone Encounter (Signed)
LMTCB

## 2016-04-04 NOTE — Telephone Encounter (Signed)
Pt calling back about her breathing machine and the fact that the insurance won't cover the company she is using not in network she can be reached @ [phone].Sheryl Curtis

## 2016-04-04 NOTE — Telephone Encounter (Signed)
Patient scheduled to see Dr. Annamaria Boots 04/04/16 at 1130 Patient aware of appointment. Nothing further needed.

## 2016-04-05 ENCOUNTER — Other Ambulatory Visit: Payer: PPO

## 2016-04-05 ENCOUNTER — Telehealth: Payer: Self-pay | Admitting: Internal Medicine

## 2016-04-05 MED ORDER — ALBUTEROL SULFATE (2.5 MG/3ML) 0.083% IN NEBU: INHALATION_SOLUTION | RESPIRATORY_TRACT | Status: DC

## 2016-04-05 NOTE — Telephone Encounter (Signed)
Spoke with the pt  She states that she is wanting [place] to send rx for Albuterol nebs to [facility]  I have sent rx  Nothing further needed per pt

## 2016-04-05 NOTE — Telephone Encounter (Signed)
Helene Kelp returning call from [facility]. You can speak to jill or rose if she is not in.

## 2016-04-05 NOTE — Telephone Encounter (Signed)
Spoke with Clarene Critchley at [facility]. States that they are in network with pt's insurance. They were needing to know if they could drop the "prn" off the albuterol instructions. The verbal has been given to take that off.  I have left a message with pt to return our call to make her aware of this.

## 2016-04-05 NOTE — Telephone Encounter (Signed)
Spoke with pt. She is confused because she received a phone call from someone about not being in network with her insurance. Spoke with [facility] and they are in network to provide pt with a nebulizer machine. I attempted to call [place], they do not open until 8am central time. A message has been left with their answering service to have them call back.

## 2016-04-05 NOTE — Telephone Encounter (Signed)
See phone note dated 04/05/16

## 2016-04-05 NOTE — Telephone Encounter (Signed)
Refill request

## 2016-04-05 NOTE — Telephone Encounter (Signed)
Pt returning call

## 2016-04-07 DIAGNOSIS — J209 Acute bronchitis, unspecified: Secondary | ICD-10-CM | POA: Insufficient documentation

## 2016-04-07 NOTE — Assessment & Plan Note (Signed)
URI with tracheobronchitis. This is likely to be viral with no indication yet for antibiotics. We discussed what to keep on hand and indications for escalating therapy over the weekend. Plan-nebulizer machine at their request to have on hand. Nebulizer treatments Xopenex, Depo-Medrol, prednisone taper, amoxicillin to hold

## 2016-04-07 NOTE — Assessment & Plan Note (Signed)
Pulse rate very regular on exam at this visit, consistent with sinus origin. She does not have a pacemaker.

## 2016-04-08 ENCOUNTER — Telehealth: Payer: Self-pay | Admitting: Internal Medicine

## 2016-04-08 NOTE — Telephone Encounter (Signed)
Yes - ok amox

## 2016-04-08 NOTE — Telephone Encounter (Signed)
Spoke with pt. She is aware of CY's recommendation. Nothing further was needed. 

## 2016-04-08 NOTE — Telephone Encounter (Signed)
Spoke with the pt  She was seen 04/04/16 by CDY and given the following instructions:  Neb xop 0.63 Dx acute exacerbation asthma  Depo 80  Script sent for prednisone taper  Order- Scripts printed for nebulizer machine and albuterol neb solution through a DME company  Script printed for amoxacillin in case this becomes more like a bacterial infection with more green, etc, as discussed  Please make appointment to see Dr Elsworth Soho for follow-up- next available   She reports that her breathing has improved only minimally  She states she wants to go ahead and take Amox, but her sputum is still white to clear  She is still taking pred taper  She is asking if CDY thinks it would be okay to start abx even though no purulent sputum  Please advise thanks! Allergies  Allergen Reactions  . Aspirin Anaphylaxis, Hives and Shortness Of Breath  . Hydroxyquinolines Hives  . Imipramine Hcl Other (See Comments)    UNKNOWN TO PATIENT  . Pamelor [Nortriptyline Hcl] Other (See Comments)    unknown  . Prednisone Shortness Of Breath and Itching    anxiety  . Procardia [Nifedipine] Other (See Comments)    Unknown   . Tramadol Swelling  . Ace Inhibitors Other (See Comments)    cough  . Plaquenil [Hydroxychloroquine Sulfate] Rash   Current Outpatient Prescriptions on File Prior to Visit  Medication Sig Dispense Refill  . albuterol (PROVENTIL) (2.5 MG/3ML) 0.083% nebulizer solution 1 vial in neb every 6 hours and as needed Dx J45.909 75 mL 12  . Albuterol Sulfate (PROAIR RESPICLICK) 123XX123 (90 BASE) MCG/ACT AEPB Inhale 1-2 puffs into the lungs as needed (for shortness of breath and/or wheezing.). 1 each 1  . amoxicillin (AMOXIL) 500 MG tablet Take 1 tablet (500 mg total) by mouth 2 (two) times daily. 14 tablet 0  . Ascorbic Acid (VITAMIN C) 1000 MG tablet Take 1,000 mg by mouth daily.    . Biotin (BIOTIN MAXIMUM STRENGTH) 5 MG CAPS Take 5 mg by mouth daily.    . budesonide-formoterol (SYMBICORT) 160-4.5  MCG/ACT inhaler Inhale 2 puffs into the lungs 2 (two) times daily.    Marland Kitchen CALCIUM PO Take 1,200 mg by mouth daily.     . clopidogrel (PLAVIX) 75 MG tablet Take 1 tablet (75 mg total) by mouth every other day. 45 tablet 3  . DULoxetine (CYMBALTA) 60 MG capsule Take 60 mg by mouth daily.  0  . folic acid (FOLVITE) 1 MG tablet Take 1 mg by mouth daily.  1  . gabapentin (NEURONTIN) 300 MG capsule Take 300 mg by mouth 2 (two) times daily. Reported on 01/24/2016    . isosorbide mononitrate (IMDUR) 30 MG 24 hr tablet Take 1 tablet (30 mg total) by mouth daily. 30 tablet 11  . Multiple Vitamin (MULTIVITAMIN WITH MINERALS) TABS Take 1 tablet by mouth daily.    . Nebulizers (COMPRESSOR NEBULIZER) MISC 1 Device by Does not apply route once. 1 each 0  . nitroGLYCERIN (NITROSTAT) 0.4 MG SL tablet Place 1 tablet (0.4 mg total) under the tongue every 5 (five) minutes as needed for chest pain. 90 tablet 3  . predniSONE (DELTASONE) 10 MG tablet 4 X 2 DAYS, 3 X 2 DAYS, 2 X 2 DAYS, 1 X 2 DAYS 20 tablet 0  . PRESCRIPTION MEDICATION Apply 1 drop to eye 2 (two) times daily.    . valsartan-hydrochlorothiazide (DIOVAN-HCT) 160-25 MG tablet Take 1 tablet by mouth daily.     No current  facility-administered medications on file prior to visit.

## 2016-04-10 ENCOUNTER — Telehealth: Payer: Self-pay | Admitting: Pulmonary Disease

## 2016-04-10 NOTE — Telephone Encounter (Signed)
Called spoke with patient who reported that her cough and breathing are improved since last ov w/ CY on 6.8.17.  She was rx'd Amoxicillin at ov and stated that she was told to use this medication "sparingly" and would like to know if she should continue taking it - she began the medication 2 days ago on 6.12.17.  She is still producing white mucus.  She finished her pred taper yesterday.  Also, pt stated that she had used a 3-wheeled motorized wheelchair x21yrs without becoming dependent on it and only used it sparingly when shopping or traveling.  The battery on her previous motorized wheelchair has "given out" and she would like to purchase another.  Pt is requesting RA sign form for this (she will have the manufacturer fax the form).  Pt asked me to reiterate that she will NOT become dependent on this machine and only uses it when needed.  RA please advise, thank you.   Per 6/8 ov w/ CY: Patient Instructions       Neb xop 0.63    Dx acute exacerbation asthma  Depo 96  Script sent for prednisone taper  Order- Scripts printed for nebulizer machine and albuterol neb solution through a DME company  Script printed for amoxacillin in case this becomes more like a bacterial infection with more green, etc, as discussed  Please make appointment to see Dr Elsworth Soho for follow-up- next available

## 2016-04-10 NOTE — Telephone Encounter (Signed)
Complete 5 days of Augmentin and then stop Refer to PCP for motorized wheelchair

## 2016-04-10 NOTE — Telephone Encounter (Signed)
Spoke with pt and gave recommendations. She will take abx for 3 more days. She will contact PCP regarding wheelchair. Nothing further needed.

## 2016-05-29 ENCOUNTER — Ambulatory Visit: Payer: PPO | Admitting: Pulmonary Disease

## 2016-06-17 ENCOUNTER — Telehealth: Payer: Self-pay | Admitting: Pulmonary Disease

## 2016-06-17 MED ORDER — BUDESONIDE-FORMOTEROL FUMARATE 160-4.5 MCG/ACT IN AERO: 2.0000 | INHALATION_SPRAY | Freq: Two times a day (BID) | RESPIRATORY_TRACT | 0 refills | Status: DC

## 2016-06-17 NOTE — Telephone Encounter (Signed)
Called and spoke with pt and she is aware of sample that has been left up front and she will come by and pick this up.

## 2016-06-25 ENCOUNTER — Ambulatory Visit: Payer: PPO | Admitting: Pulmonary Disease

## 2016-06-29 ENCOUNTER — Other Ambulatory Visit: Payer: Self-pay | Admitting: Interventional Cardiology

## 2016-07-04 ENCOUNTER — Ambulatory Visit: Payer: PPO | Attending: Orthopaedic Surgery | Admitting: Physical Therapy

## 2016-07-04 DIAGNOSIS — M6281 Muscle weakness (generalized): Secondary | ICD-10-CM | POA: Diagnosis present

## 2016-07-04 DIAGNOSIS — R262 Difficulty in walking, not elsewhere classified: Secondary | ICD-10-CM

## 2016-07-04 DIAGNOSIS — M25551 Pain in right hip: Secondary | ICD-10-CM | POA: Diagnosis present

## 2016-07-04 DIAGNOSIS — M25552 Pain in left hip: Secondary | ICD-10-CM

## 2016-07-04 NOTE — Therapy (Signed)
[place], [place], [address] Phone: [phone]   Fax:  [phone]  Physical Therapy Evaluation  Patient Details  Name: Sheryl Curtis MRN: [id number] Date of Birth: [birth year] Referring Provider: Dr. Ninfa Linden   Encounter Date: 07/04/2016      PT End of Session - 07/04/16 1552    Visit Number 1   Number of Visits 16   Date for PT Re-Evaluation 08/29/16   PT Start Time 1455   PT Stop Time 1548   PT Time Calculation (min) 53 min   Activity Tolerance Patient tolerated treatment well   Behavior During Therapy [facility] for tasks assessed/performed      Past Medical History:  Diagnosis Date  . ALLERGIC RHINITIS   . Atrial fibrillation ([place])   . CANCER, COLORECTAL   . CHEST PAIN, ATYPICAL   . DYSPNEA   . Essential hypertension, benign   . FIBROMYALGIA   . G E R D   . History of stress test    Myoview 8/16:  EF 69%, anterior and apical defect c/w breast attenuation; Low Risk   . INSOMNIA   . Other and unspecified angina pectoris     Past Surgical History:  Procedure Laterality Date  . ABDOMINAL HYSTERECTOMY    . BACK SURGERY    . BREAST SURGERY    . COLON SURGERY    . CORONARY STENT PLACEMENT    . PERCUTANEOUS CORONARY STENT INTERVENTION (PCI-S) N/A 05/19/2013   Procedure: PERCUTANEOUS CORONARY STENT INTERVENTION (PCI-S);  Surgeon: Jettie Booze, MD;  Location: [facility] CATH LAB;  Service: Cardiovascular;  Laterality: N/A;    There were no vitals filed for this visit.       Subjective Assessment - 07/04/16 1456    Subjective Pt reports pain in both hips which has been going on for over a year.  She cannot say if her pain is worsening.  Symptoms were gradual onset, pain is in lateral hips, down to top of her foot (today R>L).    Patient is accompained by: Family member   Pertinent History OA, neuropathy, Atrial fibrillation, fibromyalgia   Limitations Sitting;Lifting;Walking;Standing;House hold  activities;Other (comment)  sleeping, recreation   How long can you sit comfortably? very short time, has to reposition   How long can you stand comfortably? not at all   How long can you walk comfortably? not at all    Diagnostic tests XR of hips and back about a month ago.  All at Dr. Trevor Mace office, report not available   Patient Stated Goals Patient would like to be able to walk and enjoy lfe.     Currently in Pain? Yes   Pain Score 6    Pain Location Hip   Pain Orientation Right   Pain Descriptors / Indicators Tightness;Sharp;Constant   Pain Type Chronic pain   Pain Radiating Towards Rt. leg    Pain Onset More than a month ago   Pain Frequency Constant   Pain Relieving Factors pain meds, heat, ice she has min to no relief the these.    Effect of Pain on Daily Activities can't enjoy life    Multiple Pain Sites No            [facility] PT Assessment - 07/04/16 1504      Assessment   Medical Diagnosis bilateral hip trochanteric bursitis   Referring Provider Dr. Ninfa Linden    Onset Date/Surgical Date --  1 yr   Next MD Visit 4  weeks    Prior Therapy for another ailment      Precautions   Precautions None     Restrictions   Weight Bearing Restrictions No     Balance Screen   Has the patient fallen in the past 6 months No  3-4 times last yr   Has the patient had a decrease in activity level because of a fear of falling?  No   Is the patient reluctant to leave their home because of a fear of falling?  Yes  due to pain      [place] residence   Living Arrangements Children     Prior Function   Level of Independence Independent with household mobility with device  dtr does all of the housework   Leisure gardening, Haematologist, family      Cognition   Overall Cognitive Status Within Functional Limits for tasks assessed     Observation/Other Assessments   Focus on Therapeutic Outcomes (FOTO)  NT     Sensation   Light Touch  Impaired by gross assessment   Additional Comments neuropathy in bilateral feet     Posture/Postural Control   Posture/Postural Control Postural limitations   Postural Limitations Forward head;Decreased lumbar lordosis;Flexed trunk;Weight shift left   Posture Comments leaning L in sitting, genu valgus     PROM   Overall PROM  --  pain end range ER/IR Rt. LE and IR on Lt. LE      Strength   Right Hip Flexion 4/5   Right Hip Extension 4/5   Right Hip ABduction 3+/5   Left Hip Flexion 4/5   Left Hip Extension 4+/5   Left Hip ABduction 4+/5   Right Knee Flexion 5/5   Right Knee Extension 5/5   Left Knee Flexion 5/5   Left Knee Extension 5/5     Palpation   Palpation comment quads sore, more painful proximally, pain at Greater Trochanter R>L and to mid thigh                    [facility] Adult PT Treatment/Exercise - 07/04/16 1504      Self-Care   Self-Care Heat/Ice Application;Other Self-Care Comments   Other Self-Care Comments  HEP, ionto      Knee/Hip Exercises: Stretches   Hip Flexor Stretch Both;1 rep;60 seconds     Iontophoresis   Type of Iontophoresis Dexamethasone   Location bilateral hips    Dose 1cc    Time 6 hr                 PT Education - 07/04/16 1551    Education provided Yes   Education Details PT, POC, HEP, ionto, bursitis   Person(s) Educated Patient   Methods Explanation;Demonstration;Handout;Tactile cues;Verbal cues   Comprehension Verbalized understanding;Verbal cues required;Need further instruction          PT Short Term Goals - 07/04/16 1559      PT SHORT TERM GOAL #1   Title Pt will be I with initial HEP for hips, core    Time 4   Period Weeks   Status New     PT SHORT TERM GOAL #2   Title Pt will be able to sit comfortably for 30 min with no more than 4/10 pain.    Time 4   Period Weeks   Status New     PT SHORT TERM GOAL #3   Title Pt will be able to report more comfort  at night with sleep/positioning.     Time 4   Period Weeks   Status New     PT SHORT TERM GOAL #4   Title Pt will complete balance screen Merrilee Jansky) and set goal.    Time 4   Period Weeks   Status New           PT Long Term Goals - 07-25-2016 1601      PT LONG TERM GOAL #1   Title Pt will understand RICE, body mechanics and use at home, with ADLs.    Time 8   Period Weeks   Status New     PT LONG TERM GOAL #2   Title Pt will be able to walk through a store for up to 15 min with LRAD and pain increased to no more than 4/10.    Time 8   Period Weeks   Status New     PT LONG TERM GOAL #3   Title Pt will be able to transfer sit to supine and roll without increased pain in hips or back.    Time 8   Period Weeks   Status New     PT LONG TERM GOAL #4   Title Pt will demo hip abd strength to 4+/5 bilaterally for more improved/efficient gait.    Time 8   Period Weeks   Status New     PT LONG TERM GOAL #5   Title Balance goal TBA   Time 8   Period Weeks   Status New     Additional Long Term Goals   Additional Long Term Goals Yes     PT LONG TERM GOAL #6   Title Pt will be I with more advanced HEP and transition to community fitness, if appropriate.    Time 8   Period Weeks   Status New               Plan - 07-25-2016 1553    Clinical Impression Statement Patient presents with low complexity eval of bilateral hip bursitis (stable) which has been ongoing for about a year.  This condition is negatively impacting her ADLs and her ability to be ambulatory in the community.  She will benefit from PT to address deficits in strength, flexibility and general mobility.     Rehab Potential Good   PT Frequency 2x / week   PT Duration 8 weeks   PT Treatment/Interventions ADLs/Self Care Home Management;Moist Heat;Therapeutic activities;Therapeutic exercise;Ultrasound;Manual techniques;Taping;Balance training;Neuromuscular re-education;DME Instruction;Cryotherapy;Electrical Stimulation;Iontophoresis 4mg /ml  Dexamethasone;Functional mobility training;Patient/family education;Passive range of motion   PT Next Visit Plan TENS/IFC with A fib?, check HEP, modalities and try manual to lateral hip, assess ionto?    PT Home Exercise Plan hip abd, clam, hamstring stretch, ant hip       Patient will benefit from skilled therapeutic intervention in order to improve the following deficits and impairments:  Abnormal gait, Decreased range of motion, Difficulty walking, Increased fascial restricitons, Obesity, Increased muscle spasms, Decreased activity tolerance, Pain, Impaired flexibility, Decreased balance, Decreased mobility, Decreased strength, Impaired sensation, Postural dysfunction  Visit Diagnosis: Pain in left hip  Pain in right hip  Difficulty in walking, not elsewhere classified  Muscle weakness (generalized)      G-Codes - 07-25-2016 1606    Functional Assessment Tool Used Clinical judgement    Functional Limitation Mobility: Walking and moving around   Mobility: Walking and Moving Around Current Status [id number]) At least 60 percent but less than 80 percent impaired, limited  or restricted   Mobility: Walking and Moving Around Goal Status [phone]) At least 40 percent but less than 60 percent impaired, limited or restricted       Problem List Patient Active Problem List   Diagnosis Date Noted  . Acute bronchitis 04/07/2016  . Asthma 02/19/2016  . Chronic kidney disease (CKD), stage III (moderate) 07/05/2015  . CAFL (chronic airflow limitation) ([place]) 07/05/2015  . Generalized OA 07/05/2015  . 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 H/O malignant neoplasm of colon 07/05/2015  . Elevated troponin 07/02/2015  . UTI (lower urinary tract infection) 07/01/2015  . Fibromyalgia syndrome 07/01/2015  . Hyponatremia 07/01/2015  . AKI (acute kidney injury) ([place]) 07/01/2015  . Urinary tract infectious disease   . Pulmonary nodules/lesions, multiple 04/11/2014  . Abdominal pain, generalized  04/07/2014  . Coronary atherosclerosis of native coronary artery 10/12/2013  . Essential hypertension, benign   . Other and unspecified angina pectoris   . Atrial fibrillation ([place]) 04/19/2010  . ALLERGIC RHINITIS 04/19/2010  . DYSPNEA 04/19/2010  . CHEST PAIN, ATYPICAL 04/19/2010  . CANCER, COLORECTAL 04/18/2010  . Essential hypertension 04/18/2010  . G E R D 04/18/2010  . FIBROMYALGIA 04/18/2010  . INSOMNIA 04/18/2010    PAA,JENNIFER 07/04/2016, 4:21 PM  [facility] [address] [place], [place], [address] Phone: [phone]   Fax:  [phone]  Name: LAQUASIA LYDICK MRN: [id number] Date of Birth: [birth year]

## 2016-07-04 NOTE — Patient Instructions (Signed)
Abduction: Clam (Eccentric) - Side-Lying    Lie on side with knees bent. Lift top knee, keeping feet together. Keep trunk steady. Slowly lower for 3-5 seconds. _10-20__ reps per set, __2_ sets per day, __5_ days per week. http://ecce.exer.us/65   Copyright  VHI. All rights reserved.  Abduction: Side Leg Lift (Eccentric) - Side-Lying    Lie on side. Lift top leg slightly higher than shoulder level. Keep top leg straight with body, toes pointing forward. Slowly lower for 3-5 seconds. 10___ reps per set, _2__ sets per day, _5__ days per week.   http://ecce.exer.us/63   Copyright  VHI. All rights reserved.   HIP: Hamstrings - Short Sitting    Rest leg on raised surface. Keep knee straight. Lift chest. Hold __30_ seconds. 3___ reps per set, _2__ sets per day, _5__ days per week  Copyright  VHI. All rights reserved.   HIP: Flexors - Supine    Lie on edge of surface. Place leg off the surface, allow knee to bend. Bring other knee toward chest. Hold ___60-90 seconds. __1_ reps per set, _2__ sets per day, _5__ days per week Rest lowered foot on stool.  Copyright  VHI. All rights reserved.    IONTOPHORESIS PATIENT PRECAUTIONS & CONTRAINDICATIONS:  . Redness under one or both electrodes can occur.  This characterized by a uniform redness that usually disappears within 12 hours of treatment. . Small pinhead size blisters may result in response to the drug.  Contact your physician if the problem persists more than 24 hours. . On rare occasions, iontophoresis therapy can result in temporary skin reactions such as rash, inflammation, irritation or burns.  The skin reactions may be the result of individual sensitivity to the ionic solution used, the condition of the skin at the start of treatment, reaction to the materials in the electrodes, allergies or sensitivity to dexamethasone, or a poor connection between the patch and your skin.  Discontinue using iontophoresis if you have any  of these reactions and report to your therapist. . Remove the Patch or electrodes if you have any undue sensation of pain or burning during the treatment and report discomfort to your therapist. . Tell your Therapist if you have had known adverse reactions to the application of electrical current. . If using the Patch, the LED light will turn off when treatment is complete and the patch can be removed.  Approximate treatment time is 1-3 hours.  Remove the patch when light goes off or after 6 hours. . The Patch can be worn during normal activity, however excessive motion where the electrodes have been placed can cause poor contact between the skin and the electrode or uneven electrical current resulting in greater risk of skin irritation. Marland Kitchen Keep out of the reach of children.   . DO NOT use if you have a cardiac pacemaker or any other electrically sensitive implanted device. . DO NOT use if you have a known sensitivity to dexamethasone. . DO NOT use during Magnetic Resonance Imaging (MRI). . DO NOT use over broken or compromised skin (e.g. sunburn, cuts, or acne) due to the increased risk of skin reaction. . DO NOT SHAVE over the area to be treated:  To establish good contact between the Patch and the skin, excessive hair may be clipped. . DO NOT place the Patch or electrodes on or over your eyes, directly over your heart, or brain. . DO NOT reuse the Patch or electrodes as this may cause burns to occur.

## 2016-07-09 ENCOUNTER — Ambulatory Visit: Payer: PPO | Admitting: Physical Therapy

## 2016-07-09 DIAGNOSIS — M25552 Pain in left hip: Secondary | ICD-10-CM | POA: Diagnosis not present

## 2016-07-09 DIAGNOSIS — M6281 Muscle weakness (generalized): Secondary | ICD-10-CM

## 2016-07-09 DIAGNOSIS — R262 Difficulty in walking, not elsewhere classified: Secondary | ICD-10-CM

## 2016-07-09 DIAGNOSIS — M25551 Pain in right hip: Secondary | ICD-10-CM

## 2016-07-09 NOTE — Patient Instructions (Signed)
Bridge    Lie back, legs bent. Inhale, pressing hips up. Keeping ribs in, lengthen lower back. Exhale, rolling down along spine from top. Repeat 10 times. Do 2 sessions per day.  http://pm.exer.us/55   Copyright  VHI. All rights reserved.   

## 2016-07-10 NOTE — Therapy (Signed)
[place], [place], [address] Phone: [phone]   Fax:  [phone]  Physical Therapy Treatment  Patient Details  Name: Sheryl Curtis MRN: [id number] Date of Birth: November 13, 1931 Referring Provider: Dr. Ninfa Linden   Encounter Date: 07/09/2016      PT End of Session - 07/09/16 1506    Visit Number 2   Number of Visits 16   Date for PT Re-Evaluation 08/29/16   PT Start Time 0300   PT Stop Time 0345   PT Time Calculation (min) 45 min      Past Medical History:  Diagnosis Date  . ALLERGIC RHINITIS   . Atrial fibrillation ([place])   . CANCER, COLORECTAL   . CHEST PAIN, ATYPICAL   . DYSPNEA   . Essential hypertension, benign   . FIBROMYALGIA   . G E R D   . History of stress test    Myoview 8/16:  EF 69%, anterior and apical defect c/w breast attenuation; Low Risk   . INSOMNIA   . Other and unspecified angina pectoris     Past Surgical History:  Procedure Laterality Date  . ABDOMINAL HYSTERECTOMY    . BACK SURGERY    . BREAST SURGERY    . COLON SURGERY    . CORONARY STENT PLACEMENT    . PERCUTANEOUS CORONARY STENT INTERVENTION (PCI-S) N/A 05/19/2013   Procedure: PERCUTANEOUS CORONARY STENT INTERVENTION (PCI-S);  Surgeon: Jettie Booze, MD;  Location: [facility] CATH LAB;  Service: Cardiovascular;  Laterality: N/A;    There were no vitals filed for this visit.      Subjective Assessment - 07/09/16 1506    Subjective Pt    Currently in Pain? Yes   Pain Score 6    Pain Location Hip   Pain Orientation Right                         [facility] Adult PT Treatment/Exercise - 07/10/16 0001      Knee/Hip Exercises: Stretches   Hip Flexor Stretch Both;1 rep;60 seconds     Knee/Hip Exercises: Supine   Bridges 10 reps   Other Supine Knee/Hip Exercises supine clamx 10   added red band x 10     Knee/Hip Exercises: Sidelying   Hip ABduction Both;10 reps   Clams x10 each      Iontophoresis   Type of Iontophoresis Dexamethasone   Location bilateral hips    Dose 1cc    Time 6 hr      Manual Therapy   Manual Therapy Soft tissue mobilization   Manual therapy comments massage roller and trigger point release to bilateral lateral hips                  PT Short Term Goals - 07/04/16 1559      PT SHORT TERM GOAL #1   Title Pt will be I with initial HEP for hips, core    Time 4   Period Weeks   Status New     PT SHORT TERM GOAL #2   Title Pt will be able to sit comfortably for 30 min with no more than 4/10 pain.    Time 4   Period Weeks   Status New     PT SHORT TERM GOAL #3   Title Pt will be able to report more comfort at night with sleep/positioning.    Time 4   Period Weeks   Status New  PT SHORT TERM GOAL #4   Title Pt will complete balance screen Merrilee Jansky) and set goal.    Time 4   Period Weeks   Status New           PT Long Term Goals - 07/04/16 1601      PT LONG TERM GOAL #1   Title Pt will understand RICE, body mechanics and use at home, with ADLs.    Time 8   Period Weeks   Status New     PT LONG TERM GOAL #2   Title Pt will be able to walk through a store for up to 15 min with LRAD and pain increased to no more than 4/10.    Time 8   Period Weeks   Status New     PT LONG TERM GOAL #3   Title Pt will be able to transfer sit to supine and roll without increased pain in hips or back.    Time 8   Period Weeks   Status New     PT LONG TERM GOAL #4   Title Pt will demo hip abd strength to 4+/5 bilaterally for more improved/efficient gait.    Time 8   Period Weeks   Status New     PT LONG TERM GOAL #5   Title Balance goal TBA   Time 8   Period Weeks   Status New     Additional Long Term Goals   Additional Long Term Goals Yes     PT LONG TERM GOAL #6   Title Pt will be I with more advanced HEP and transition to community fitness, if appropriate.    Time 8   Period Weeks   Status New                Plan - 07/09/16 1509    Clinical Impression Statement pt reports no change with iontophoresis.  She has attempted HEP. Manual with massage roller to lateral right hip. Repeated ionto as previous. We reviewed HEP and updated. She reports decreased lateral hip pain after manual.    PT Next Visit Plan TENS/IFC with A fib?, check HEP, modalities and try manual to lateral hip, assess ionto?       Patient will benefit from skilled therapeutic intervention in order to improve the following deficits and impairments:  Abnormal gait, Decreased range of motion, Difficulty walking, Increased fascial restricitons, Obesity, Increased muscle spasms, Decreased activity tolerance, Pain, Impaired flexibility, Decreased balance, Decreased mobility, Decreased strength, Impaired sensation, Postural dysfunction  Visit Diagnosis: Pain in left hip  Pain in right hip  Difficulty in walking, not elsewhere classified  Muscle weakness (generalized)     Problem List Patient Active Problem List   Diagnosis Date Noted  . Acute bronchitis 04/07/2016  . Asthma 02/19/2016  . Chronic kidney disease (CKD), stage III (moderate) 07/05/2015  . CAFL (chronic airflow limitation) ([place]) 07/05/2015  . Generalized OA 07/05/2015  . 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 H/O malignant neoplasm of colon 07/05/2015  . Elevated troponin 07/02/2015  . UTI (lower urinary tract infection) 07/01/2015  . Fibromyalgia syndrome 07/01/2015  . Hyponatremia 07/01/2015  . AKI (acute kidney injury) ([place]) 07/01/2015  . Urinary tract infectious disease   . Pulmonary nodules/lesions, multiple 04/11/2014  . Abdominal pain, generalized 04/07/2014  . Coronary atherosclerosis of native coronary artery 10/12/2013  . Essential hypertension, benign   . Other and unspecified angina pectoris   . Atrial fibrillation ([place]) 04/19/2010  . ALLERGIC RHINITIS  04/19/2010  . DYSPNEA 04/19/2010  . CHEST PAIN, ATYPICAL 04/19/2010  . CANCER,  COLORECTAL 04/18/2010  . Essential hypertension 04/18/2010  . G E R D 04/18/2010  . FIBROMYALGIA 04/18/2010  . INSOMNIA 04/18/2010    Dorene Ar, PTA 07/10/2016, 9:13 AM  [place], [place], [address] Phone: [phone]   Fax:  [phone]  Name: Sheryl Curtis MRN: [id number] Date of Birth: June 14, 1932

## 2016-07-12 ENCOUNTER — Ambulatory Visit: Payer: PPO | Admitting: Physical Therapy

## 2016-07-12 DIAGNOSIS — M25552 Pain in left hip: Secondary | ICD-10-CM

## 2016-07-12 DIAGNOSIS — R262 Difficulty in walking, not elsewhere classified: Secondary | ICD-10-CM

## 2016-07-12 DIAGNOSIS — M25551 Pain in right hip: Secondary | ICD-10-CM

## 2016-07-12 DIAGNOSIS — M6281 Muscle weakness (generalized): Secondary | ICD-10-CM

## 2016-07-12 NOTE — Therapy (Signed)
[place], [place], [address] Phone: [phone]   Fax:  [phone]  Physical Therapy Treatment  Patient Details  Name: Sheryl Curtis MRN: [id number] Date of Birth: 09-05-1932 Referring Provider: Dr. Ninfa Linden   Encounter Date: 07/12/2016      PT End of Session - 07/12/16 1157    Visit Number 3   Number of Visits 16   Date for PT Re-Evaluation 08/29/16   PT Start Time 1104   PT Stop Time 1147   PT Time Calculation (min) 43 min   Activity Tolerance Patient tolerated treatment well   Behavior During Therapy [facility] for tasks assessed/performed      Past Medical History:  Diagnosis Date  . ALLERGIC RHINITIS   . Atrial fibrillation ([place])   . CANCER, COLORECTAL   . CHEST PAIN, ATYPICAL   . DYSPNEA   . Essential hypertension, benign   . FIBROMYALGIA   . G E R D   . History of stress test    Myoview 8/16:  EF 69%, anterior and apical defect c/w breast attenuation; Low Risk   . INSOMNIA   . Other and unspecified angina pectoris     Past Surgical History:  Procedure Laterality Date  . ABDOMINAL HYSTERECTOMY    . BACK SURGERY    . BREAST SURGERY    . COLON SURGERY    . CORONARY STENT PLACEMENT    . PERCUTANEOUS CORONARY STENT INTERVENTION (PCI-S) N/A 05/19/2013   Procedure: PERCUTANEOUS CORONARY STENT INTERVENTION (PCI-S);  Surgeon: Jettie Booze, MD;  Location: [facility] CATH LAB;  Service: Cardiovascular;  Laterality: N/A;    There were no vitals filed for this visit.      Subjective Assessment - 07/12/16 1104    Subjective Pt not having a good day. She has good days and bad.  Pain incr to 8/10 with walking.  The patches were itchy, not sure of they helped any.    Currently in Pain? Yes   Pain Score 3    Pain Location Hip                         [facility] Adult PT Treatment/Exercise - 07/12/16 1114      Lumbar Exercises: Stretches   Single Knee to Chest Stretch 2 reps;30  seconds   Single Knee to Chest Stretch Limitations tends to ER hip    Active Hamstring Stretch Both;2 reps;30 seconds     Knee/Hip Exercises: Stretches   Other Knee/Hip Stretches ITB 30 sec x 3 each leg     Knee/Hip Exercises: Aerobic   Nustep L6 for 6 min UE and LE      Knee/Hip Exercises: Supine   Bridges 10 reps   Other Supine Knee/Hip Exercises supine clam with blue band   x 10 blue band unilateral and bilateral x 20      Iontophoresis   Type of Iontophoresis Dexamethasone   Location bilateral hips   Dose 1 cc   Time 6 hour patch      Manual Therapy   Manual Therapy Soft tissue mobilization   Manual therapy comments massage roller and trigger point release to bilateral lateral hips                PT Education - 07/12/16 1156    Education provided Yes   Education Details blue band, no weighted lumbar flexion at gym   Person(s) Educated Patient   Methods Explanation;Demonstration  Comprehension Verbalized understanding          PT Short Term Goals - 07/04/16 1559      PT SHORT TERM GOAL #1   Title Pt will be I with initial HEP for hips, core    Time 4   Period Weeks   Status New     PT SHORT TERM GOAL #2   Title Pt will be able to sit comfortably for 30 min with no more than 4/10 pain.    Time 4   Period Weeks   Status New     PT SHORT TERM GOAL #3   Title Pt will be able to report more comfort at night with sleep/positioning.    Time 4   Period Weeks   Status New     PT SHORT TERM GOAL #4   Title Pt will complete balance screen Merrilee Jansky) and set goal.    Time 4   Period Weeks   Status New           PT Long Term Goals - 07/04/16 1601      PT LONG TERM GOAL #1   Title Pt will understand RICE, body mechanics and use at home, with ADLs.    Time 8   Period Weeks   Status New     PT LONG TERM GOAL #2   Title Pt will be able to walk through a store for up to 15 min with LRAD and pain increased to no more than 4/10.    Time 8   Period  Weeks   Status New     PT LONG TERM GOAL #3   Title Pt will be able to transfer sit to supine and roll without increased pain in hips or back.    Time 8   Period Weeks   Status New     PT LONG TERM GOAL #4   Title Pt will demo hip abd strength to 4+/5 bilaterally for more improved/efficient gait.    Time 8   Period Weeks   Status New     PT LONG TERM GOAL #5   Title Balance goal TBA   Time 8   Period Weeks   Status New     Additional Long Term Goals   Additional Long Term Goals Yes     PT LONG TERM GOAL #6   Title Pt will be I with more advanced HEP and transition to community fitness, if appropriate.    Time 8   Period Weeks   Status New               Plan - 07/12/16 1157    Clinical Impression Statement No further goals met, progressing towards goals.  Less pain after session.     PT Next Visit Plan TENS/IFC with A fib ( pt will check with cardiologist) , check HEP, modalities and try manual to lateral hip, assess ionto?    PT Home Exercise Plan hip abd, clam, hamstring stretch, ant hip    Consulted and Agree with Plan of Care Patient      Patient will benefit from skilled therapeutic intervention in order to improve the following deficits and impairments:  Abnormal gait, Decreased range of motion, Difficulty walking, Increased fascial restricitons, Obesity, Increased muscle spasms, Decreased activity tolerance, Pain, Impaired flexibility, Decreased balance, Decreased mobility, Decreased strength, Impaired sensation, Postural dysfunction  Visit Diagnosis: Pain in left hip  Pain in right hip  Difficulty in walking, not elsewhere classified  Muscle  weakness (generalized)     Problem List Patient Active Problem List   Diagnosis Date Noted  . Acute bronchitis 04/07/2016  . Asthma 02/19/2016  . Chronic kidney disease (CKD), stage III (moderate) 07/05/2015  . CAFL (chronic airflow limitation) ([place]) 07/05/2015  . Generalized OA 07/05/2015  . Detrusor  muscle hypertonia 07/05/2015  . H/O malignant neoplasm of colon 07/05/2015  . Elevated troponin 07/02/2015  . UTI (lower urinary tract infection) 07/01/2015  . Fibromyalgia syndrome 07/01/2015  . Hyponatremia 07/01/2015  . AKI (acute kidney injury) ([place]) 07/01/2015  . Urinary tract infectious disease   . Pulmonary nodules/lesions, multiple 04/11/2014  . Abdominal pain, generalized 04/07/2014  . Coronary atherosclerosis of native coronary artery 10/12/2013  . Essential hypertension, benign   . Other and unspecified angina pectoris   . Atrial fibrillation ([place]) 04/19/2010  . ALLERGIC RHINITIS 04/19/2010  . DYSPNEA 04/19/2010  . CHEST PAIN, ATYPICAL 04/19/2010  . CANCER, COLORECTAL 04/18/2010  . Essential hypertension 04/18/2010  . G E R D 04/18/2010  . FIBROMYALGIA 04/18/2010  . INSOMNIA 04/18/2010    PAA,JENNIFER 07/12/2016, 11:59 AM  [place], [place], [address] Phone: [phone]   Fax:  [phone]  Name: TYONA NILSEN MRN: [id number] Date of Birth: 06/01/1932  Raeford Razor, PT 07/12/16 11:59 AM Phone: [phone] Fax: [phone]

## 2016-07-12 NOTE — Patient Instructions (Signed)
Abduction: Clam (Eccentric) - Side-Lying    Lie on side with knees bent. Lift top knee, keeping feet together. Keep trunk steady. Slowly lower for 3-5 seconds. ___ reps per set, ___ sets per day, ___ days per week.   Lie on your back, wrap blue band around your thighs.  Keep your back pressed into the mat and move knees out  to the side. X 10-20, 1-2 sets, 1 x per day.

## 2016-07-16 ENCOUNTER — Ambulatory Visit: Payer: PPO | Admitting: Physical Therapy

## 2016-07-16 DIAGNOSIS — M25552 Pain in left hip: Secondary | ICD-10-CM | POA: Diagnosis not present

## 2016-07-16 DIAGNOSIS — M25551 Pain in right hip: Secondary | ICD-10-CM

## 2016-07-16 DIAGNOSIS — R262 Difficulty in walking, not elsewhere classified: Secondary | ICD-10-CM

## 2016-07-16 DIAGNOSIS — M6281 Muscle weakness (generalized): Secondary | ICD-10-CM

## 2016-07-16 NOTE — Therapy (Signed)
[place], [place], [address] Phone: [phone]   Fax:  [phone]  Physical Therapy Treatment  Patient Details  Name: Sheryl Curtis MRN: [id number] Date of Birth: 01-17-1932 Referring Provider: Dr. Ninfa Linden   Encounter Date: 07/16/2016      PT End of Session - 07/16/16 1508    Visit Number 4   Number of Visits 16   Date for PT Re-Evaluation 08/29/16   PT Start Time 0302   PT Stop Time 0350   PT Time Calculation (min) 48 min      Past Medical History:  Diagnosis Date  . ALLERGIC RHINITIS   . Atrial fibrillation ([place])   . CANCER, COLORECTAL   . CHEST PAIN, ATYPICAL   . DYSPNEA   . Essential hypertension, benign   . FIBROMYALGIA   . G E R D   . History of stress test    Myoview 8/16:  EF 69%, anterior and apical defect c/w breast attenuation; Low Risk   . INSOMNIA   . Other and unspecified angina pectoris     Past Surgical History:  Procedure Laterality Date  . ABDOMINAL HYSTERECTOMY    . BACK SURGERY    . BREAST SURGERY    . COLON SURGERY    . CORONARY STENT PLACEMENT    . PERCUTANEOUS CORONARY STENT INTERVENTION (PCI-S) N/A 05/19/2013   Procedure: PERCUTANEOUS CORONARY STENT INTERVENTION (PCI-S);  Surgeon: Jettie Booze, MD;  Location: [facility] CATH LAB;  Service: Cardiovascular;  Laterality: N/A;    There were no vitals filed for this visit.      Subjective Assessment - 07/16/16 1506    Subjective I woke up every hour on the hour last night   Currently in Pain? Yes   Pain Score 7    Pain Location Hip   Pain Orientation Right   Pain Radiating Towards Rt leg   Aggravating Factors  trying to sleep                          [facility] Adult PT Treatment/Exercise - 07/16/16 0001      Knee/Hip Exercises: Aerobic   Nustep L5 LE only x 10 minutes     Modalities   Modalities Cryotherapy;Ultrasound     Cryotherapy   Number Minutes Cryotherapy 10 Minutes   Cryotherapy Location Hip  RT   Type of Cryotherapy Ice pack     Ultrasound   Ultrasound Location RT lateral hip   Ultrasound Parameters 1.4 w/cm2 100% x 8 minutes   Ultrasound Goals Pain     Manual Therapy   Manual Therapy Taping   Kinesiotex --  Fan to lateral hip                  PT Short Term Goals - 07/16/16 1543      PT SHORT TERM GOAL #1   Title Pt will be I with initial HEP for hips, core    Time 4   Period Weeks   Status Achieved     PT SHORT TERM GOAL #2   Title Pt will be able to sit comfortably for 30 min with no more than 4/10 pain.    Time 4   Period Weeks   Status On-going     PT SHORT TERM GOAL #3   Title Pt will be able to report more comfort at night with sleep/positioning.    Time 4   Period Weeks  Status On-going     PT SHORT TERM GOAL #4   Title Pt will complete balance screen Merrilee Jansky) and set goal.    Time 4   Period Weeks   Status On-going           PT Long Term Goals - 07/04/16 1601      PT LONG TERM GOAL #1   Title Pt will understand RICE, body mechanics and use at home, with ADLs.    Time 8   Period Weeks   Status New     PT LONG TERM GOAL #2   Title Pt will be able to walk through a store for up to 15 min with LRAD and pain increased to no more than 4/10.    Time 8   Period Weeks   Status New     PT LONG TERM GOAL #3   Title Pt will be able to transfer sit to supine and roll without increased pain in hips or back.    Time 8   Period Weeks   Status New     PT LONG TERM GOAL #4   Title Pt will demo hip abd strength to 4+/5 bilaterally for more improved/efficient gait.    Time 8   Period Weeks   Status New     PT LONG TERM GOAL #5   Title Balance goal TBA   Time 8   Period Weeks   Status New     Additional Long Term Goals   Additional Long Term Goals Yes     PT LONG TERM GOAL #6   Title Pt will be I with more advanced HEP and transition to community fitness, if appropriate.    Time 8   Period Weeks    Status New               Plan - 07/16/16 1543    Clinical Impression Statement Pt is independent with HEP and reports PT has reduced her overall pain. She has most difficulty with sleep and pain levels on right low back and hip are 8/10 without pain meds. Trial of ultrasound which reduced pain to zero during treatment follow by KT tape Fan to lateral hip. STG#1 MET.    PT Next Visit Plan Assess tape and ultrasound; NEEDS BERG; TENS/IFC with A fib ( pt will check with cardiologist) -precaution; check HEP, modalities and try manual to lateral hip,       Patient will benefit from skilled therapeutic intervention in order to improve the following deficits and impairments:  Abnormal gait, Decreased range of motion, Difficulty walking, Increased fascial restricitons, Obesity, Increased muscle spasms, Decreased activity tolerance, Pain, Impaired flexibility, Decreased balance, Decreased mobility, Decreased strength, Impaired sensation, Postural dysfunction  Visit Diagnosis: Pain in left hip  Pain in right hip  Difficulty in walking, not elsewhere classified  Muscle weakness (generalized)     Problem List Patient Active Problem List   Diagnosis Date Noted  . Acute bronchitis 04/07/2016  . Asthma 02/19/2016  . Chronic kidney disease (CKD), stage III (moderate) 07/05/2015  . CAFL (chronic airflow limitation) ([place]) 07/05/2015  . Generalized OA 07/05/2015  . 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 H/O malignant neoplasm of colon 07/05/2015  . Elevated troponin 07/02/2015  . UTI (lower urinary tract infection) 07/01/2015  . Fibromyalgia syndrome 07/01/2015  . Hyponatremia 07/01/2015  . AKI (acute kidney injury) ([place]) 07/01/2015  . Urinary tract infectious disease   . Pulmonary nodules/lesions, multiple 04/11/2014  . Abdominal pain, generalized  04/07/2014  . Coronary atherosclerosis of native coronary artery 10/12/2013  . Essential hypertension, benign   . Other and unspecified  angina pectoris   . Atrial fibrillation ([place]) 04/19/2010  . ALLERGIC RHINITIS 04/19/2010  . DYSPNEA 04/19/2010  . CHEST PAIN, ATYPICAL 04/19/2010  . CANCER, COLORECTAL 04/18/2010  . Essential hypertension 04/18/2010  . G E R D 04/18/2010  . FIBROMYALGIA 04/18/2010  . INSOMNIA 04/18/2010    Dorene Ar 07/16/2016, 3:51 PM  [facility] [address] [place], [place], [address] Phone: [phone]   Fax:  [phone]  Name: Sheryl Curtis MRN: [id number] Date of Birth: 1932/05/28

## 2016-07-17 ENCOUNTER — Telehealth: Payer: Self-pay | Admitting: Interventional Cardiology

## 2016-07-17 NOTE — Telephone Encounter (Signed)
New message   Pt verbalized that she needs rn to see if pt can be placed on a Tensrelectriacal  Stimulation

## 2016-07-17 NOTE — Telephone Encounter (Signed)
Left message to call back  

## 2016-07-17 NOTE — Telephone Encounter (Signed)
Spoke with pt and she states that she is currently participating in PT and they wanted to know if it would be ok to use a Tens machine to help with her back and hip pain.  Advised I will send message to Dr. Irish Lack for approval.

## 2016-07-17 NOTE — Telephone Encounter (Signed)
OK for TENS machine.

## 2016-07-18 ENCOUNTER — Ambulatory Visit: Payer: PPO | Admitting: Physical Therapy

## 2016-07-18 DIAGNOSIS — M25551 Pain in right hip: Secondary | ICD-10-CM

## 2016-07-18 DIAGNOSIS — M25552 Pain in left hip: Secondary | ICD-10-CM

## 2016-07-18 DIAGNOSIS — M6281 Muscle weakness (generalized): Secondary | ICD-10-CM

## 2016-07-18 DIAGNOSIS — R262 Difficulty in walking, not elsewhere classified: Secondary | ICD-10-CM

## 2016-07-18 NOTE — Telephone Encounter (Signed)
**Note De-identified  Obfuscation** I did receive confirmation that the fax was successful. 

## 2016-07-18 NOTE — Therapy (Signed)
[place], [place], [address] Phone: [phone]   Fax:  [phone]  Physical Therapy Treatment  Patient Details  Name: Sheryl Curtis MRN: [id number] Date of Birth: Aug 04, 1932 Referring Provider: Dr. Ninfa Linden   Encounter Date: 07/18/2016      PT End of Session - 07/18/16 1536    Visit Number 5   Number of Visits 16   Date for PT Re-Evaluation 08/29/16   PT Start Time 1500   PT Stop Time 1546   PT Time Calculation (min) 46 min   Activity Tolerance Patient tolerated treatment well   Behavior During Therapy [facility] for tasks assessed/performed      Past Medical History:  Diagnosis Date  . ALLERGIC RHINITIS   . Atrial fibrillation ([place])   . CANCER, COLORECTAL   . CHEST PAIN, ATYPICAL   . DYSPNEA   . Essential hypertension, benign   . FIBROMYALGIA   . G E R D   . History of stress test    Myoview 8/16:  EF 69%, anterior and apical defect c/w breast attenuation; Low Risk   . INSOMNIA   . Other and unspecified angina pectoris     Past Surgical History:  Procedure Laterality Date  . ABDOMINAL HYSTERECTOMY    . BACK SURGERY    . BREAST SURGERY    . COLON SURGERY    . CORONARY STENT PLACEMENT    . PERCUTANEOUS CORONARY STENT INTERVENTION (PCI-S) N/A 05/19/2013   Procedure: PERCUTANEOUS CORONARY STENT INTERVENTION (PCI-S);  Surgeon: Jettie Booze, MD;  Location: [facility] CATH LAB;  Service: Cardiovascular;  Laterality: N/A;    There were no vitals filed for this visit.      Subjective Assessment - 07/18/16 1459    Subjective Was so sleepy last time. I hurt so bad I could hardly stand it.  Dr. said we could do TENS (IFC) .  See note in chart.    Currently in Pain? Yes   Pain Score 5   premedicated    Pain Location Hip   Pain Orientation Right   Pain Descriptors / Indicators Aching   Pain Type Chronic pain   Pain Onset More than a month ago   Pain Frequency Constant            [facility] PT  Assessment - 07/18/16 1508      Berg Balance Test   Sit to Stand Able to stand without using hands and stabilize independently   Standing Unsupported Able to stand safely 2 minutes   Sitting with Back Unsupported but Feet Supported on Floor or Stool Able to sit safely and securely 2 minutes   Stand to Sit Sits safely with minimal use of hands   Transfers Able to transfer safely, minor use of hands   Standing Unsupported with Eyes Closed Able to stand 10 seconds safely   Standing Ubsupported with Feet Together Able to place feet together independently and stand for 1 minute with supervision   From Standing, Reach Forward with Outstretched Arm Can reach confidently >25 cm (10")   From Standing Position, Pick up Object from Floor Able to pick up shoe safely and easily   From Standing Position, Turn to Look Behind Over each Shoulder Looks behind from both sides and weight shifts well   Turn 360 Degrees Able to turn 360 degrees safely in 4 seconds or less   Standing Unsupported, Alternately Place Feet on Step/Stool Able to complete >2 steps/needs minimal assist  Standing Unsupported, One Foot in Front Able to plae foot ahead of the other independently and hold 30 seconds   Standing on One Leg Tries to lift leg/unable to hold 3 seconds but remains standing independently   Total Score 48                     [facility] Adult PT Treatment/Exercise - 07/18/16 1534      Self-Care   Self-Care Heat/Ice Application;Other Self-Care Comments   Heat/Ice Application ice pack    Other Self-Care Comments  benefit of tape, IFC and precaution with cardiac     Knee/Hip Exercises: Stretches   Active Hamstring Stretch Both;2 reps;30 seconds     Cryotherapy   Number Minutes Cryotherapy 15 Minutes   Cryotherapy Location Hip  RT   Type of Cryotherapy Ice pack     Electrical Stimulation   Electrical Stimulation Location R hip    Electrical Stimulation Action IFC   Electrical Stimulation Parameters  to tol (28)   Electrical Stimulation Goals Pain                PT Education - 07/18/16 1536    Education provided Yes   Education Details see self care, use of cane, Berg results   Person(s) Educated Patient   Methods Explanation   Comprehension Verbalized understanding;Need further instruction          PT Short Term Goals - 07/16/16 1543      PT SHORT TERM GOAL #1   Title Pt will be I with initial HEP for hips, core    Time 4   Period Weeks   Status Achieved     PT SHORT TERM GOAL #2   Title Pt will be able to sit comfortably for 30 min with no more than 4/10 pain.    Time 4   Period Weeks   Status On-going     PT SHORT TERM GOAL #3   Title Pt will be able to report more comfort at night with sleep/positioning.    Time 4   Period Weeks   Status On-going     PT SHORT TERM GOAL #4   Title Pt will complete balance screen Merrilee Jansky) and set goal.    Time 4   Period Weeks   Status On-going           PT Long Term Goals - 07/18/16 1539      PT LONG TERM GOAL #1   Title Pt will understand RICE, body mechanics and use at home, with ADLs.    Status Achieved     PT LONG TERM GOAL #2   Title Pt will be able to walk through a store for up to 15 min with LRAD and pain increased to no more than 4/10.    Status On-going     PT LONG TERM GOAL #3   Title Pt will be able to transfer sit to supine and roll without increased pain in hips or back.    Status On-going     PT LONG TERM GOAL #4   Title Pt will demo hip abd strength to 4+/5 bilaterally for more improved/efficient gait.    Status On-going     PT LONG TERM GOAL #5   Title Berg score will increase to 52/56 to demo decr fall risk   Time 6   Status New     PT LONG TERM GOAL #6   Title Pt will be I with more advanced  HEP and transition to community fitness, if appropriate.    Status On-going               Plan - 07/18/16 1536    Clinical Impression Statement Pt not doing all her exercises  everyday, does most of them though.  She has not reported any pain relief but functionally she can sit to stand without UE assist.  Trial IFC today, cardiologist approved this prior.     PT Next Visit Plan Work on standing, single leg balance and hip abd strength, OK for IFC, assess IFC.  try massage   PT Home Exercise Plan hip abd, clam, hamstring stretch, ant hip , bridge    Consulted and Agree with Plan of Care Patient    Pt states she has no pain after IFC, ICE.   Patient will benefit from skilled therapeutic intervention in order to improve the following deficits and impairments:  Abnormal gait, Decreased range of motion, Difficulty walking, Increased fascial restricitons, Obesity, Increased muscle spasms, Decreased activity tolerance, Pain, Impaired flexibility, Decreased balance, Decreased mobility, Decreased strength, Impaired sensation, Postural dysfunction  Visit Diagnosis: Pain in left hip  Pain in right hip  Difficulty in walking, not elsewhere classified  Muscle weakness (generalized)     Problem List Patient Active Problem List   Diagnosis Date Noted  . Acute bronchitis 04/07/2016  . Asthma 02/19/2016  . Chronic kidney disease (CKD), stage III (moderate) 07/05/2015  . CAFL (chronic airflow limitation) ([place]) 07/05/2015  . Generalized OA 07/05/2015  . 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 H/O malignant neoplasm of colon 07/05/2015  . Elevated troponin 07/02/2015  . UTI (lower urinary tract infection) 07/01/2015  . Fibromyalgia syndrome 07/01/2015  . Hyponatremia 07/01/2015  . AKI (acute kidney injury) ([place]) 07/01/2015  . Urinary tract infectious disease   . Pulmonary nodules/lesions, multiple 04/11/2014  . Abdominal pain, generalized 04/07/2014  . Coronary atherosclerosis of native coronary artery 10/12/2013  . Essential hypertension, benign   . Other and unspecified angina pectoris   . Atrial fibrillation ([place]) 04/19/2010  . ALLERGIC RHINITIS 04/19/2010  .  DYSPNEA 04/19/2010  . CHEST PAIN, ATYPICAL 04/19/2010  . CANCER, COLORECTAL 04/18/2010  . Essential hypertension 04/18/2010  . G E R D 04/18/2010  . FIBROMYALGIA 04/18/2010  . INSOMNIA 04/18/2010    Shericka Johnstone 07/18/2016, 3:42 PM  [facility] [address] [place], [place], [address] Phone: [phone]   Fax:  [phone]  Name: SHEROLYN PEARSON MRN: [id number] Date of Birth: 21-Aug-1932   Raeford Razor, PT 07/18/16 3:42 PM Phone: [phone] Fax: [phone]

## 2016-07-18 NOTE — Telephone Encounter (Signed)
Follow Up: ° ° ° °Returning your call from yesterday. °

## 2016-07-18 NOTE — Telephone Encounter (Signed)
The pt is advised and per her request I faxed this message to [facility] at [phone].

## 2016-07-23 ENCOUNTER — Ambulatory Visit: Payer: PPO | Admitting: Physical Therapy

## 2016-07-23 DIAGNOSIS — M6281 Muscle weakness (generalized): Secondary | ICD-10-CM

## 2016-07-23 DIAGNOSIS — M25551 Pain in right hip: Secondary | ICD-10-CM

## 2016-07-23 DIAGNOSIS — M25552 Pain in left hip: Secondary | ICD-10-CM

## 2016-07-23 DIAGNOSIS — R262 Difficulty in walking, not elsewhere classified: Secondary | ICD-10-CM

## 2016-07-23 NOTE — Therapy (Signed)
Sheryl Curtis, [place], [address] Phone: [phone]   Fax:  [phone]  Physical Therapy Treatment  Patient Details  Name: Sheryl Curtis MRN: [id number] Date of Birth: [birth year] Referring Provider: Dr. Ninfa Linden   Encounter Date: 07/23/2016      PT End of Session - 07/23/16 1508    Visit Number 6   Number of Visits 16   Date for PT Re-Evaluation 08/29/16   PT Start Time 1500   PT Stop Time 1600   PT Time Calculation (min) 60 min   Activity Tolerance Patient tolerated treatment well;Other (comment)  Pt poor historian today, inconsistent report of sx   Behavior During Therapy WFL for tasks assessed/performed      Past Medical History:  Diagnosis Date  . ALLERGIC RHINITIS   . Atrial fibrillation ([place])   . CANCER, COLORECTAL   . CHEST PAIN, ATYPICAL   . DYSPNEA   . Essential hypertension, benign   . FIBROMYALGIA   . G E R D   . History of stress test    Myoview 8/16:  EF 69%, anterior and apical defect c/w breast attenuation; Low Risk   . INSOMNIA   . Other and unspecified angina pectoris     Past Surgical History:  Procedure Laterality Date  . ABDOMINAL HYSTERECTOMY    . BACK SURGERY    . BREAST SURGERY    . COLON SURGERY    . CORONARY STENT PLACEMENT    . PERCUTANEOUS CORONARY STENT INTERVENTION (PCI-S) N/A 05/19/2013   Procedure: PERCUTANEOUS CORONARY STENT INTERVENTION (PCI-S);  Surgeon: Sheryl Booze, MD;  Location: [facility] C[facility] LAB;  Service: Cardiovascular;  Laterality: N/A;    There were no vitals filed for this visit.      Subjective Assessment - 07/23/16 1502    Subjective I was hurting so bad last night I barely slept.  Today's the first day I have left the house. I am about a 7/10 but this weekend I was a 9/10-10/10.  She thinks the IFC may have helped pain a bit.    Currently in Pain? Yes   Pain Score 7    Pain Location Hip   Pain Orientation Right;Left   Pain Descriptors  / Indicators Aching   Pain Type Chronic pain   Pain Onset More than a month ago   Pain Frequency Constant             [facility] Adult PT Treatment/Exercise - 07/23/16 1513      Self-Care   Other Self-Care Comments  body mechanics , stenosis      Knee/Hip Exercises: Stretches   Active Hamstring Stretch Both;2 reps;30 seconds   Other Knee/Hip Stretches ITB 30 sec x 2 each leg     Knee/Hip Exercises: Aerobic   Nustep LE and UE and level 4 for strengthening , 5 min      Knee/Hip Exercises: Sidelying   Hip ABduction Strengthening;Both;1 set;10 reps   Hip ABduction Limitations knee bent    Clams ER and IR x 20 each    Other Sidelying Knee/Hip Exercises hip circles x 10 each LE, and direction      Electrical Stimulation   Electrical Stimulation Location R hip    Electrical Stimulation Action IFC   Electrical Stimulation Parameters to tol (26)   Electrical Stimulation Goals Pain     Manual Therapy   Manual Therapy Soft tissue mobilization;Manual Traction   Manual therapy comments Joint mob , post mob of  hips   knee flexion and adduction    Soft tissue mobilization with massage roller bilateral lateral hips, glutes    Manual Traction long axis distraction each leg                   PT Short Term Goals - 07/23/16 1529      PT SHORT TERM GOAL #1   Title Pt will be I with initial HEP for hips, core    Status Achieved     PT SHORT TERM GOAL #2   Title Pt will be able to sit comfortably for 30 min with no more than 4/10 pain.    Status On-going     PT SHORT TERM GOAL #3   Title Pt will be able to report more comfort at night with sleep/positioning.    Status On-going     PT SHORT TERM GOAL #4   Title Pt will complete balance screen Merrilee Jansky) and set goal.    Status Achieved           PT Long Term Goals - 07/23/16 1531      PT LONG TERM GOAL #1   Title Pt will understand RICE, body mechanics and use at home, with ADLs.    Status Achieved     PT LONG TERM  GOAL #2   Title Pt will be able to walk through a store for up to 15 min with LRAD and pain increased to no more than 4/10.    Status On-going     PT LONG TERM GOAL #3   Title Pt will be able to transfer sit to supine and roll without increased pain in hips or back.    Status On-going     PT LONG TERM GOAL #4   Title Pt will demo hip abd strength to 4+/5 bilaterally for more improved/efficient gait.    Status On-going     PT LONG TERM GOAL #5   Title Berg score will increase to 52/56 to demo decr fall risk   Status On-going     PT LONG TERM GOAL #6   Title Pt will be I with more advanced HEP and transition to community fitness, if appropriate.    Status On-going               Plan - 07/23/16 1545    Clinical Impression Statement Pt not as sore as she once was.  Pain still severe at times.  Hip PROM is good and for the most part, pain free.  Pain in legs with standing may be due to lumbar spine pathology which she has not had intervention for recently. She says she has been told she has "arthritis all over" her spine.    PT Next Visit Plan Work on standing, single leg balance and hip abd strength, OK for IFC,   try massage/STW    PT Home Exercise Plan hip abd, clam, hamstring stretch, ant hip , bridge    Consulted and Agree with Plan of Care Patient      Patient will benefit from skilled therapeutic intervention in order to improve the following deficits and impairments:  Abnormal gait, Decreased range of motion, Difficulty walking, Increased fascial restricitons, Obesity, Increased muscle spasms, Decreased activity tolerance, Pain, Impaired flexibility, Decreased balance, Decreased mobility, Decreased strength, Impaired sensation, Postural dysfunction  Visit Diagnosis: Pain in left hip  Pain in right hip  Difficulty in walking, not elsewhere classified  Muscle weakness (generalized)     Problem  List Patient Active Problem List   Diagnosis Date Noted  . Acute  bronchitis 04/07/2016  . Asthma 02/19/2016  . Chronic kidney disease (CKD), stage III (moderate) 07/05/2015  . CAFL (chronic airflow limitation) ([place]) 07/05/2015  . Generalized OA 07/05/2015  . 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 H/O malignant neoplasm of colon 07/05/2015  . Elevated troponin 07/02/2015  . UTI (lower urinary tract infection) 07/01/2015  . Fibromyalgia syndrome 07/01/2015  . Hyponatremia 07/01/2015  . AKI (acute kidney injury) ([place]) 07/01/2015  . Urinary tract infectious disease   . Pulmonary nodules/lesions, multiple 04/11/2014  . Abdominal pain, generalized 04/07/2014  . Coronary atherosclerosis of native coronary artery 10/12/2013  . Essential hypertension, benign   . Other and unspecified angina pectoris   . Atrial fibrillation ([place]) 04/19/2010  . ALLERGIC RHINITIS 04/19/2010  . DYSPNEA 04/19/2010  . CHEST PAIN, ATYPICAL 04/19/2010  . CANCER, COLORECTAL 04/18/2010  . Essential hypertension 04/18/2010  . G E R D 04/18/2010  . FIBROMYALGIA 04/18/2010  . INSOMNIA 04/18/2010    PAA,JENNIFER 07/23/2016, 3:47 PM  [facility] [address] [place], [place], [address] Phone: [phone]   Fax:  [phone]  Name: AHRI STEGENGA MRN: [id number] Date of Birth: 09-Oct-1932   Raeford Razor, PT 07/23/16 3:47 PM Phone: [phone] Fax: [phone]

## 2016-07-25 ENCOUNTER — Ambulatory Visit: Payer: PPO | Admitting: Physical Therapy

## 2016-07-25 DIAGNOSIS — M25551 Pain in right hip: Secondary | ICD-10-CM

## 2016-07-25 DIAGNOSIS — R262 Difficulty in walking, not elsewhere classified: Secondary | ICD-10-CM

## 2016-07-25 DIAGNOSIS — M6281 Muscle weakness (generalized): Secondary | ICD-10-CM

## 2016-07-25 DIAGNOSIS — M25552 Pain in left hip: Secondary | ICD-10-CM

## 2016-07-25 NOTE — Patient Instructions (Signed)
    HOW TO MAKE AN ICE PACK Ice packs are the most common way to use ice therapy. Other methods include ice massage, ice baths, and cryosprays. Muscle creams that cause a cold, tingly feeling do not offer the same benefits that ice offers and should not be used as a substitute unless recommended by your caregiver. To make an ice pack, do one of the following:  Place crushed ice or a bag of frozen vegetables in a sealable plastic bag. Squeeze out the excess air. Place this bag inside another plastic bag. Slide the bag into a pillowcase or place a damp towel between your skin and the bag.  Mix 3 parts water with 1 part rubbing alcohol. Freeze the mixture in a sealable plastic bag. When you remove the mixture from the freezer, it will be slushy. Squeeze out the excess air. Place this bag inside another plastic bag. Slide the bag into a pillowcase or place a damp towel between your skin and the bag.

## 2016-07-25 NOTE — Therapy (Addendum)
[facility] [place], [place], [place], [address] Phone: [phone]   Fax:  [phone]  Physical Therapy Treatment  Patient Details  Name: Sheryl Curtis MRN: [id number] Date of Birth: November 11, 1931 Referring Provider: Dr. Ninfa Linden   Encounter Date: 07/25/2016      PT End of Session - 07/25/16 1616    Visit Number 6   Number of Visits 16   Date for PT Re-Evaluation 08/29/16   PT Start Time 0300   PT Stop Time 0405   PT Time Calculation (min) 65 min      Past Medical History:  Diagnosis Date  . ALLERGIC RHINITIS   . Atrial fibrillation ([place])   . CANCER, COLORECTAL   . CHEST PAIN, ATYPICAL   . DYSPNEA   . Essential hypertension, benign   . FIBROMYALGIA   . G E R D   . History of stress test    Myoview 8/16:  EF 69%, anterior and apical defect c/w breast attenuation; Low Risk   . INSOMNIA   . Other and unspecified angina pectoris     Past Surgical History:  Procedure Laterality Date  . ABDOMINAL HYSTERECTOMY    . BACK SURGERY    . BREAST SURGERY    . COLON SURGERY    . CORONARY STENT PLACEMENT    . PERCUTANEOUS CORONARY STENT INTERVENTION (PCI-S) N/A 05/19/2013   Procedure: PERCUTANEOUS CORONARY STENT INTERVENTION (PCI-S);  Surgeon: Jettie Booze, MD;  Location: [facility] CATH LAB;  Service: Cardiovascular;  Laterality: N/A;    There were no vitals filed for this visit.      Subjective Assessment - 07/25/16 1618    Subjective I hurt real bad the other night   Currently in Pain? Yes   Pain Score 7    Pain Location Hip   Pain Orientation Right   Pain Radiating Towards Rt leg   Aggravating Factors  standing, trying to sleep    Pain Relieving Factors meds, ice            [facility] Adult PT Treatment/Exercise - 07/25/16 0001      Lumbar Exercises: Prone   Other Prone Lumbar Exercises Prone Tra contract x 5, with hamstring curls x 10  each      Knee/Hip Exercises: Stretches   Hip Flexor Stretch 1 rep   Hip Flexor Stretch Limitations right on side on mat     Knee/Hip Exercises: Supine   Bridges 10 reps   Bridges Limitations Glute bridge with dorsi flexion     Cryotherapy   Number Minutes Cryotherapy 20 Minutes   Cryotherapy Location Hip   Type of Cryotherapy Ice pack     Manual Therapy   Manual Therapy Joint mobilization;Passive ROM   Joint Mobilization Prone anter ior hip mobs grade 3 right   Soft tissue mobilization with massage roller right hips, glutes    Passive ROM Right hip extension in sidelying, increased back pain.                 PT Education - 07/25/16 1615    Education provided Yes   Education Details How to make an ice pack   Person(s) Educated Patient   Methods Explanation;Handout   Comprehension Verbalized understanding          PT Short Term Goals - 07/23/16 1529      PT SHORT TERM GOAL #1   Title Pt will be I with initial HEP for hips, core    Status Achieved  PT SHORT TERM GOAL #2   Title Pt will be able to sit comfortably for 30 min with no more than 4/10 pain.    Status On-going     PT SHORT TERM GOAL #3   Title Pt will be able to report more comfort at night with sleep/positioning.    Status On-going     PT SHORT TERM GOAL #4   Title Pt will complete balance screen Sheryl Curtis) and set goal.    Status Achieved           PT Long Term Goals - 07/23/16 1531      PT LONG TERM GOAL #1   Title Pt will understand RICE, body mechanics and use at home, with ADLs.    Status Achieved     PT LONG TERM GOAL #2   Title Pt will be able to walk through a store for up to 15 min with LRAD and pain increased to no more than 4/10.    Status On-going     PT LONG TERM GOAL #3   Title Pt will be able to transfer sit to supine and roll without increased pain in hips or back.    Status On-going     PT LONG TERM GOAL #4   Title Pt will demo hip abd strength to 4+/5 bilaterally for more improved/efficient gait.    Status On-going     PT LONG  TERM GOAL #5   Title Berg score will increase to 52/56 to demo decr fall risk   Status On-going     PT LONG TERM GOAL #6   Title Pt will be I with more advanced HEP and transition to community fitness, if appropriate.    Status On-going      GCODES: mobility CL CL CL         Plan - 07/25/16 1621    Clinical Impression Statement Pt reports improved ability to perfrom sit to stand transfers at church without requiring pew in front of her to pull up. She reports much less soreneess in right hip however her radicular pain is unbearable at times. In prone Pt reports no radicular sxin right leg. Anterior hip mobs to increase hip extension. Pt demonstrates tight anterior hip with thomas stretch. Passive hip extension increases back pain.  IFC after treatment. Pt reports    PT Next Visit Plan Work on standing, single leg balance and hip abd strength, OK for IFC,   try massage/STW       Patient will benefit from skilled therapeutic intervention in order to improve the following deficits and impairments:  Abnormal gait, Decreased range of motion, Difficulty walking, Increased fascial restricitons, Obesity, Increased muscle spasms, Decreased activity tolerance, Pain, Impaired flexibility, Decreased balance, Decreased mobility, Decreased strength, Impaired sensation, Postural dysfunction  Visit Diagnosis: Pain in left hip  Pain in right hip  Difficulty in walking, not elsewhere classified  Muscle weakness (generalized)     Problem List Patient Active Problem List   Diagnosis Date Noted  . Acute bronchitis 04/07/2016  . Asthma 02/19/2016  . Chronic kidney disease (CKD), stage III (moderate) 07/05/2015  . CAFL (chronic airflow limitation) ([place]) 07/05/2015  . Generalized OA 07/05/2015  . 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 H/O malignant neoplasm of colon 07/05/2015  . Elevated troponin 07/02/2015  . UTI (lower urinary tract infection) 07/01/2015  . Fibromyalgia syndrome  07/01/2015  . Hyponatremia 07/01/2015  . AKI (acute kidney injury) ([place]) 07/01/2015  . Urinary tract infectious disease   .  Pulmonary nodules/lesions, multiple 04/11/2014  . Abdominal pain, generalized 04/07/2014  . Coronary atherosclerosis of native coronary artery 10/12/2013  . Essential hypertension, benign   . Other and unspecified angina pectoris   . Atrial fibrillation ([place]) 04/19/2010  . ALLERGIC RHINITIS 04/19/2010  . DYSPNEA 04/19/2010  . CHEST PAIN, ATYPICAL 04/19/2010  . CANCER, COLORECTAL 04/18/2010  . Essential hypertension 04/18/2010  . G E R D 04/18/2010  . FIBROMYALGIA 04/18/2010  . INSOMNIA 04/18/2010    Dorene Ar, PTA 07/25/2016, 4:31 PM  [facility] [place], [place], [address] Phone: [phone]   Fax:  [phone]  Name: Sheryl Curtis MRN: [id number] Date of Birth: 1932-03-13   PHYSICAL THERAPY DISCHARGE SUMMARY  Visits from Start of Care: 6  Current functional level related to goals / functional outcomes: Unknown, did not return   Remaining deficits: Unknown at this time    Education / Equipment: HEP, posture   Plan: Patient agrees to discharge.  Patient goals were not met. Patient is being discharged due to not returning since the last visit.  ?????     Raeford Razor, PT 10/02/16 2:10 PM Phone: [phone] Fax: [phone]

## 2016-07-28 ENCOUNTER — Other Ambulatory Visit: Payer: Self-pay | Admitting: Physician Assistant

## 2016-07-30 ENCOUNTER — Ambulatory Visit: Payer: PPO | Admitting: Pulmonary Disease

## 2016-07-31 ENCOUNTER — Ambulatory Visit (INDEPENDENT_AMBULATORY_CARE_PROVIDER_SITE_OTHER): Payer: PPO | Admitting: Orthopaedic Surgery

## 2016-07-31 DIAGNOSIS — M4126 Other idiopathic scoliosis, lumbar region: Secondary | ICD-10-CM | POA: Diagnosis not present

## 2016-07-31 DIAGNOSIS — M7061 Trochanteric bursitis, right hip: Secondary | ICD-10-CM

## 2016-08-01 ENCOUNTER — Ambulatory Visit: Payer: PPO | Admitting: Physical Therapy

## 2016-08-02 ENCOUNTER — Observation Stay (HOSPITAL_COMMUNITY)
Admission: EM | Admit: 2016-08-02 | Discharge: 2016-08-05 | Disposition: A | Discharge status: Home / Self Care | Payer: PPO | Attending: Family Medicine | Admitting: Family Medicine

## 2016-08-02 ENCOUNTER — Encounter (HOSPITAL_COMMUNITY): Payer: Self-pay | Admitting: Emergency Medicine

## 2016-08-02 DIAGNOSIS — I129 Hypertensive chronic kidney disease with stage 1 through stage 4 chronic kidney disease, or unspecified chronic kidney disease: Secondary | ICD-10-CM | POA: Insufficient documentation

## 2016-08-02 DIAGNOSIS — K219 Gastro-esophageal reflux disease without esophagitis: Secondary | ICD-10-CM | POA: Diagnosis present

## 2016-08-02 DIAGNOSIS — M25551 Pain in right hip: Secondary | ICD-10-CM | POA: Insufficient documentation

## 2016-08-02 DIAGNOSIS — M1990 Unspecified osteoarthritis, unspecified site: Secondary | ICD-10-CM | POA: Insufficient documentation

## 2016-08-02 DIAGNOSIS — Z90710 Acquired absence of both cervix and uterus: Secondary | ICD-10-CM | POA: Insufficient documentation

## 2016-08-02 DIAGNOSIS — N183 Chronic kidney disease, stage 3 unspecified: Secondary | ICD-10-CM | POA: Diagnosis present

## 2016-08-02 DIAGNOSIS — I25119 Atherosclerotic heart disease of native coronary artery with unspecified angina pectoris: Secondary | ICD-10-CM | POA: Insufficient documentation

## 2016-08-02 DIAGNOSIS — G8929 Other chronic pain: Secondary | ICD-10-CM | POA: Insufficient documentation

## 2016-08-02 DIAGNOSIS — Z8249 Family history of ischemic heart disease and other diseases of the circulatory system: Secondary | ICD-10-CM | POA: Insufficient documentation

## 2016-08-02 DIAGNOSIS — I160 Hypertensive urgency: Secondary | ICD-10-CM | POA: Diagnosis not present

## 2016-08-02 DIAGNOSIS — M797 Fibromyalgia: Secondary | ICD-10-CM | POA: Diagnosis not present

## 2016-08-02 DIAGNOSIS — Z800 Family history of malignant neoplasm of digestive organs: Secondary | ICD-10-CM | POA: Diagnosis not present

## 2016-08-02 DIAGNOSIS — E871 Hypo-osmolality and hyponatremia: Secondary | ICD-10-CM | POA: Diagnosis not present

## 2016-08-02 DIAGNOSIS — J309 Allergic rhinitis, unspecified: Secondary | ICD-10-CM | POA: Diagnosis not present

## 2016-08-02 DIAGNOSIS — I4891 Unspecified atrial fibrillation: Secondary | ICD-10-CM | POA: Diagnosis not present

## 2016-08-02 DIAGNOSIS — Z79899 Other long term (current) drug therapy: Secondary | ICD-10-CM | POA: Insufficient documentation

## 2016-08-02 DIAGNOSIS — Z85038 Personal history of other malignant neoplasm of large intestine: Secondary | ICD-10-CM | POA: Insufficient documentation

## 2016-08-02 DIAGNOSIS — Z87891 Personal history of nicotine dependence: Secondary | ICD-10-CM | POA: Diagnosis not present

## 2016-08-02 DIAGNOSIS — Z886 Allergy status to analgesic agent status: Secondary | ICD-10-CM | POA: Insufficient documentation

## 2016-08-02 DIAGNOSIS — G4700 Insomnia, unspecified: Secondary | ICD-10-CM | POA: Insufficient documentation

## 2016-08-02 DIAGNOSIS — I10 Essential (primary) hypertension: Secondary | ICD-10-CM | POA: Diagnosis present

## 2016-08-02 DIAGNOSIS — J449 Chronic obstructive pulmonary disease, unspecified: Secondary | ICD-10-CM | POA: Insufficient documentation

## 2016-08-02 DIAGNOSIS — Z808 Family history of malignant neoplasm of other organs or systems: Secondary | ICD-10-CM | POA: Insufficient documentation

## 2016-08-02 DIAGNOSIS — R001 Bradycardia, unspecified: Secondary | ICD-10-CM | POA: Diagnosis not present

## 2016-08-02 DIAGNOSIS — N390 Urinary tract infection, site not specified: Secondary | ICD-10-CM | POA: Diagnosis not present

## 2016-08-02 DIAGNOSIS — Z888 Allergy status to other drugs, medicaments and biological substances status: Secondary | ICD-10-CM | POA: Insufficient documentation

## 2016-08-02 DIAGNOSIS — H538 Other visual disturbances: Secondary | ICD-10-CM

## 2016-08-02 DIAGNOSIS — Z955 Presence of coronary angioplasty implant and graft: Secondary | ICD-10-CM | POA: Diagnosis not present

## 2016-08-02 DIAGNOSIS — M159 Polyosteoarthritis, unspecified: Secondary | ICD-10-CM | POA: Diagnosis present

## 2016-08-02 NOTE — ED Triage Notes (Addendum)
Brought in by EMS from home with c/o hypertension.  Pt reported that she was watching TV tonight when she "suddenly can't see the TV"--- pt checked her BP and it was high, and called EMS.  Pt's vision resolved after a few seconds.  Pt denies headache or dizziness or chest pain.

## 2016-08-02 NOTE — ED Notes (Signed)
Bed: [id number] Expected date:  Expected time:  Means of arrival:  Comments: 82 yr old hypertension

## 2016-08-02 NOTE — ED Provider Notes (Signed)
[facility] DEPT Provider Note   CSN: [id number] Arrival date & time: 08/02/16  2154     History   Chief Complaint Chief Complaint  Patient presents with  . Hypertension    HPI Sheryl Curtis is a 82 y.o. female.  The history is provided by the patient.  Hypertension  This is a chronic problem. Progression since onset: noted BP of 220/80s. Associated symptoms include headaches (mild). Pertinent negatives include no chest pain, no abdominal pain and no shortness of breath. Nothing aggravates the symptoms. Nothing relieves the symptoms.   Also reported blurry vision.  Past Medical History:  Diagnosis Date  . ALLERGIC RHINITIS   . Atrial fibrillation ([place])   . CANCER, COLORECTAL   . CHEST PAIN, ATYPICAL   . DYSPNEA   . Essential hypertension, benign   . FIBROMYALGIA   . G E R D   . History of stress test    Myoview 8/16:  EF 69%, anterior and apical defect c/w breast attenuation; Low Risk   . INSOMNIA   . Other and unspecified angina pectoris     Patient Active Problem List   Diagnosis Date Noted  . Hypertensive urgency 08/03/2016  . Acute bronchitis 04/07/2016  . Asthma 02/19/2016  . Chronic kidney disease (CKD), stage III (moderate) 07/05/2015  . CAFL (chronic airflow limitation) ([place]) 07/05/2015  . Generalized OA 07/05/2015  . 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 H/O malignant neoplasm of colon 07/05/2015  . Elevated troponin 07/02/2015  . UTI (lower urinary tract infection) 07/01/2015  . Fibromyalgia syndrome 07/01/2015  . Hyponatremia 07/01/2015  . AKI (acute kidney injury) ([place]) 07/01/2015  . Urinary tract infectious disease   . Pulmonary nodules/lesions, multiple 04/11/2014  . Abdominal pain, generalized 04/07/2014  . Coronary atherosclerosis of native coronary artery 10/12/2013  . Essential hypertension, benign   . Other and unspecified angina pectoris   . Atrial fibrillation ([place]) 04/19/2010  . ALLERGIC RHINITIS 04/19/2010  . DYSPNEA  04/19/2010  . CHEST PAIN, ATYPICAL 04/19/2010  . CANCER, COLORECTAL 04/18/2010  . Essential hypertension 04/18/2010  . G E R D 04/18/2010  . FIBROMYALGIA 04/18/2010  . INSOMNIA 04/18/2010    Past Surgical History:  Procedure Laterality Date  . ABDOMINAL HYSTERECTOMY    . BACK SURGERY    . BREAST SURGERY    . COLON SURGERY    . CORONARY STENT PLACEMENT    . PERCUTANEOUS CORONARY STENT INTERVENTION (PCI-S) N/A 05/19/2013   Procedure: PERCUTANEOUS CORONARY STENT INTERVENTION (PCI-S);  Surgeon: Jettie Booze, MD;  [facility];  Service: Cardiovascular;  Laterality: N/A;    OB History    No data available       Home Medications    Prior to Admission medications   Medication Sig Start Date End Date Taking? Authorizing Provider  albuterol (PROVENTIL) (2.5 MG/3ML) 0.083% nebulizer solution 1 vial in neb every 6 hours and as needed Dx J45.909 Patient taking differently: Take 2.5 mg by nebulization every 4 (four) hours as needed for wheezing or shortness of breath. 1 vial in neb every 6 hours and as needed Dx J45.909 04/05/16  Yes Deneise Lever, MD  Albuterol Sulfate (PROAIR RESPICLICK) 123XX123 (90 BASE) MCG/ACT AEPB Inhale 1-2 puffs into the lungs as needed (for shortness of breath and/or wheezing.). 01/10/15  Yes Rigoberto Noel, MD  budesonide-formoterol (SYMBICORT) 160-4.5 MCG/ACT inhaler Inhale 2 puffs into the lungs 2 (two) times daily. 06/17/16 08/02/16 Yes Rigoberto Noel, MD  CALCIUM PO Take 1,200 mg  by mouth daily.    Yes Historical Provider, MD  clopidogrel (PLAVIX) 75 MG tablet TAKE 1 TABLET(75 MG) BY MOUTH EVERY OTHER DAY 07/02/16  Yes Jettie Booze, MD  amoxicillin (AMOXIL) 500 MG tablet Take 1 tablet (500 mg total) by mouth 2 (two) times daily. Patient not taking: Reported on 08/02/2016 04/04/16   Deneise Lever, MD  Ascorbic Acid (VITAMIN C) 1000 MG tablet Take 1,000 mg by mouth daily.    Historical Provider, MD  DULoxetine (CYMBALTA) 60 MG capsule Take 60 mg by  mouth daily. 05/16/15   Historical Provider, MD  isosorbide mononitrate (IMDUR) 30 MG 24 hr tablet TAKE 1 TABLET(30 MG) BY MOUTH DAILY 07/29/16   Liliane Shi, PA-C  Nebulizers (COMPRESSOR NEBULIZER) MISC 1 Device by Does not apply route once. 04/04/16   Deneise Lever, MD  nitroGLYCERIN (NITROSTAT) 0.4 MG SL tablet Place 1 tablet (0.4 mg total) under the tongue every 5 (five) minutes as needed for chest pain. 07/07/15   Jettie Booze, MD  predniSONE (DELTASONE) 10 MG tablet 4 X 2 DAYS, 3 X 2 DAYS, 2 X 2 DAYS, 1 X 2 DAYS 04/04/16   Deneise Lever, MD  PRESCRIPTION MEDICATION Apply 1 drop to eye 2 (two) times daily.    Historical Provider, MD  valsartan-hydrochlorothiazide (DIOVAN-HCT) 160-25 MG tablet Take 1 tablet by mouth daily.    Historical Provider, MD    Family History Family History  Problem Relation Age of Onset  . CAD Brother   . Heart attack Brother 37  . Bone cancer Sister   . Throat cancer Daughter     Social History Social History  Substance Use Topics  . Smoking status: Former Smoker    Packs/day: 1.00    Years: 12.00    Types: Cigarettes    Start date: 10/28/1949    Quit date: 10/28/1962  . Smokeless tobacco: Never Used  . Alcohol use No     Allergies   Aspirin; Hydroxyquinolines; Imipramine hcl; Pamelor [nortriptyline hcl]; Prednisone; Procardia [nifedipine]; Tramadol; Ace inhibitors; and Plaquenil [hydroxychloroquine sulfate]   Review of Systems Review of Systems  Constitutional: Negative for chills and fever.  HENT: Negative for ear pain and sore throat.   Eyes: Positive for visual disturbance. Negative for pain.  Respiratory: Negative for cough and shortness of breath.   Cardiovascular: Negative for chest pain and palpitations.  Gastrointestinal: Negative for abdominal pain and vomiting.  Genitourinary: Negative for dysuria and hematuria.  Musculoskeletal: Negative for arthralgias and back pain.  Skin: Negative for color change and rash.  Neurological:  Positive for headaches (mild). Negative for seizures and syncope.  All other systems reviewed and are negative.    Physical Exam Updated Vital Signs BP (!) 210/82   Pulse 83   Temp 98.3 F (36.8 C) (Oral)   Resp 18   SpO2 96%   Physical Exam  Constitutional: She is oriented to person, place, and time. She appears well-developed and well-nourished. No distress.  HENT:  Head: Normocephalic and atraumatic.  Nose: Nose normal.  Eyes: Conjunctivae and EOM are normal. Pupils are equal, round, and reactive to light. Right eye exhibits no discharge. Left eye exhibits no discharge. No scleral icterus.  20/25 acuity bilaterally with lenses.  Neck: Normal range of motion. Neck supple.  Cardiovascular: Normal rate and regular rhythm.  Exam reveals no gallop and no friction rub.   No murmur heard. Pulmonary/Chest: Effort normal and breath sounds normal. No stridor. No respiratory distress. She has  no rales.  Abdominal: Soft. She exhibits no distension. There is no tenderness.  Musculoskeletal: She exhibits no edema or tenderness.  Neurological: She is alert and oriented to person, place, and time.  Mental Status: Alert and oriented to person, place, and time. Attention and concentration normal. Speech clear. Recent memory is intac  Cranial Nerves  II Visual Fields: Intact to confrontation. Visual fields intact. III, IV, VI: Pupils equal and reactive to light and near. Full eye movement without nystagmus  V Facial Sensation: Normal. No weakness of masticatory muscles  VII: No facial weakness or asymmetry  VIII Auditory Acuity: Grossly normal  IX/X: The uvula is midline; the palate elevates symmetrically  XI: Normal sternocleidomastoid and trapezius strength  XII: The tongue is midline. No atrophy or fasciculations.   Motor System: Muscle Strength: 5/5 and symmetric in the upper and lower extremities. No pronation or drift.  Muscle Tone: Tone and muscle bulk are normal in the upper and  lower extremities.   Reflexes: DTRs: 2+ and symmetrical in all four extremities. Plantar responses are flexor bilaterally.  Coordination: Intact finger-to-nose. No tremor.  Sensation: Intact to light touch, and pinprick.     Skin: Skin is warm and dry. No rash noted. She is not diaphoretic. No erythema.  Psychiatric: She has a normal mood and affect.  Vitals reviewed.    ED Treatments / Results  Labs (all labs ordered are listed, but only abnormal results are displayed) Labs Reviewed  CBC WITH DIFFERENTIAL/PLATELET - Abnormal; Notable for the following:       Result Value   WBC 10.6 (*)    All other components within normal limits  BASIC METABOLIC PANEL - Abnormal; Notable for the following:    Sodium 134 (*)    Glucose, Bld 117 (*)    BUN 23 (*)    Creatinine, Ser 1.03 (*)    GFR calc non Af Amer 49 (*)    GFR calc Af Amer 57 (*)    All other components within normal limits    EKG  EKG Interpretation  Date/Time:  Friday August 02 2016 21:57:45 EDT Ventricular Rate:  85 PR Interval:    QRS Duration: 83 QT Interval:  379 QTC Calculation: 451 R Axis:   56 Text Interpretation:  Sinus rhythm No significant change since last tracing Confirmed by [facility] MD, Wille Aubuchon ([id number]) on 08/03/2016 12:37:04 AM       Radiology No results found.  Procedures Procedures (including critical care time)  Medications Ordered in ED Medications  labetalol (NORMODYNE,TRANDATE) injection 20 mg (20 mg Intravenous Given 08/03/16 0008)     Initial Impression / Assessment and Plan / ED Course  I have reviewed the triage vital signs and the nursing notes.  Pertinent labs & imaging results that were available during my care of the patient were reviewed by me and considered in my medical decision making (see chart for details).  Clinical Course    Hypertensive urgency with systolics in the upper [id number] with reported vision changes. No evidence of end organ damage on labs. EKG without acute  ischemic changes. Patient given 20 mg of labetalol with some improvement in blood pressure to 180s. Will admit to hospitalist for observation to ensure that pressure maintains and blurry vision does not recur.   Final Clinical Impressions(s) / ED Diagnoses   Final diagnoses:  Hypertension, unspecified type  Blurry vision, bilateral    Disposition: Admit  Condition: Stable    Fatima Blank, MD 08/03/16 [id number]

## 2016-08-03 ENCOUNTER — Observation Stay (HOSPITAL_COMMUNITY): Payer: PPO

## 2016-08-03 ENCOUNTER — Encounter (HOSPITAL_COMMUNITY): Payer: Self-pay

## 2016-08-03 DIAGNOSIS — I160 Hypertensive urgency: Secondary | ICD-10-CM | POA: Diagnosis not present

## 2016-08-03 LAB — BASIC METABOLIC PANEL
Anion gap: 8 (ref 5–15)
BUN: 23 mg/dL — ABNORMAL HIGH (ref 6–20)
CO2: 25 mmol/L (ref 22–32)
Calcium: 9.3 mg/dL (ref 8.9–10.3)
Chloride: 101 mmol/L (ref 101–111)
Creatinine, Ser: 1.03 mg/dL — ABNORMAL HIGH (ref 0.44–1.00)
GFR calc Af Amer: 57 mL/min — ABNORMAL LOW (ref >60)
GFR calc non Af Amer: 49 mL/min — ABNORMAL LOW (ref >60)
Glucose, Bld: 117 mg/dL — ABNORMAL HIGH (ref 65–99)
Potassium: 4 mmol/L (ref 3.5–5.1)
Sodium: 134 mmol/L — ABNORMAL LOW (ref 135–145)

## 2016-08-03 LAB — CBC WITH DIFFERENTIAL/PLATELET
Basophils Absolute: 0 10*3/uL (ref 0.0–0.1)
Basophils Relative: 0 %
Eosinophils Absolute: 0.2 10*3/uL (ref 0.0–0.7)
Eosinophils Relative: 2 %
HCT: 37.1 % (ref 36.0–46.0)
Hemoglobin: 12.2 g/dL (ref 12.0–15.0)
Lymphocytes Relative: 21 %
Lymphs Abs: 2.3 10*3/uL (ref 0.7–4.0)
MCH: 31 pg (ref 26.0–34.0)
MCHC: 32.9 g/dL (ref 30.0–36.0)
MCV: 94.2 fL (ref 78.0–100.0)
Monocytes Absolute: 0.8 10*3/uL (ref 0.1–1.0)
Monocytes Relative: 8 %
Neutro Abs: 7.2 10*3/uL (ref 1.7–7.7)
Neutrophils Relative %: 69 %
Platelets: 249 10*3/uL (ref 150–400)
RBC: 3.94 MIL/uL (ref 3.87–5.11)
RDW: 13.8 % (ref 11.5–15.5)
WBC: 10.6 10*3/uL — ABNORMAL HIGH (ref 4.0–10.5)

## 2016-08-03 MED ORDER — ENOXAPARIN SODIUM 40 MG/0.4ML ~~LOC~~ SOLN
40.0000 mg | SUBCUTANEOUS | Status: DC
  Administered 2016-08-03 – 2016-08-05 (×3): 40 mg via SUBCUTANEOUS
  Filled 2016-08-03 (×3): qty 0.4

## 2016-08-03 MED ORDER — HYDROCODONE-ACETAMINOPHEN 5-325 MG PO TABS
1.0000 | ORAL_TABLET | Freq: Four times a day (QID) | ORAL | Status: DC | PRN
  Administered 2016-08-03 (×2): 1 via ORAL
  Filled 2016-08-03 (×2): qty 1

## 2016-08-03 MED ORDER — LABETALOL HCL 5 MG/ML IV SOLN
10.0000 mg | INTRAVENOUS | Status: DC | PRN
  Administered 2016-08-03: 15 mg via INTRAVENOUS
  Administered 2016-08-04: 20 mg via INTRAVENOUS
  Filled 2016-08-03 (×4): qty 4

## 2016-08-03 MED ORDER — IRBESARTAN 150 MG PO TABS
150.0000 mg | ORAL_TABLET | Freq: Every day | ORAL | Status: DC
  Administered 2016-08-03 – 2016-08-05 (×3): 150 mg via ORAL
  Filled 2016-08-03 (×3): qty 1

## 2016-08-03 MED ORDER — MOMETASONE FURO-FORMOTEROL FUM 200-5 MCG/ACT IN AERO
2.0000 | INHALATION_SPRAY | Freq: Two times a day (BID) | RESPIRATORY_TRACT | Status: DC
  Administered 2016-08-03 – 2016-08-05 (×5): 2 via RESPIRATORY_TRACT
  Filled 2016-08-03: qty 8.8

## 2016-08-03 MED ORDER — OXYCODONE-ACETAMINOPHEN 5-325 MG PO TABS
1.0000 | ORAL_TABLET | Freq: Four times a day (QID) | ORAL | Status: DC | PRN
  Administered 2016-08-03 – 2016-08-04 (×3): 2 via ORAL
  Administered 2016-08-04: 1 via ORAL
  Administered 2016-08-04 – 2016-08-05 (×3): 2 via ORAL
  Filled 2016-08-03 (×5): qty 2
  Filled 2016-08-03: qty 1
  Filled 2016-08-03: qty 2

## 2016-08-03 MED ORDER — ALBUTEROL SULFATE 108 (90 BASE) MCG/ACT IN AEPB: 1.0000 | INHALATION_SPRAY | RESPIRATORY_TRACT | Status: DC | PRN

## 2016-08-03 MED ORDER — DULOXETINE HCL 60 MG PO CPEP
60.0000 mg | ORAL_CAPSULE | Freq: Every day | ORAL | Status: DC
  Administered 2016-08-03 – 2016-08-05 (×3): 60 mg via ORAL
  Filled 2016-08-03 (×3): qty 1

## 2016-08-03 MED ORDER — CLOPIDOGREL BISULFATE 75 MG PO TABS
75.0000 mg | ORAL_TABLET | ORAL | Status: DC
Start: 2016-08-04 — End: 2016-08-05
  Administered 2016-08-04: 75 mg via ORAL
  Filled 2016-08-03: qty 1

## 2016-08-03 MED ORDER — ISOSORBIDE MONONITRATE ER 30 MG PO TB24
30.0000 mg | ORAL_TABLET | Freq: Every day | ORAL | Status: DC
  Administered 2016-08-03 – 2016-08-05 (×3): 30 mg via ORAL
  Filled 2016-08-03 (×3): qty 1

## 2016-08-03 MED ORDER — HYDROCHLOROTHIAZIDE 25 MG PO TABS
25.0000 mg | ORAL_TABLET | Freq: Every day | ORAL | Status: DC
  Administered 2016-08-03 – 2016-08-05 (×3): 25 mg via ORAL
  Filled 2016-08-03 (×3): qty 1

## 2016-08-03 MED ORDER — VALSARTAN-HYDROCHLOROTHIAZIDE 160-25 MG PO TABS: 1.0000 | ORAL_TABLET | Freq: Every day | ORAL | Status: DC

## 2016-08-03 MED ORDER — ALBUTEROL SULFATE (2.5 MG/3ML) 0.083% IN NEBU: 2.5000 mg | INHALATION_SOLUTION | RESPIRATORY_TRACT | Status: DC | PRN

## 2016-08-03 MED ORDER — LABETALOL HCL 5 MG/ML IV SOLN
20.0000 mg | Freq: Once | INTRAVENOUS | Status: AC
  Administered 2016-08-03: 20 mg via INTRAVENOUS
  Filled 2016-08-03: qty 4

## 2016-08-03 NOTE — H&P (Signed)
History and Physical    Sheryl Curtis [id number] DOB: 04-06-1932 DOA: 08/02/2016   PCP: Donnie Coffin, MD Chief Complaint:  Chief Complaint  Patient presents with  . Hypertension    HPI: Sheryl Curtis is a 82 y.o. female with medical history significant of HTN.  Patient presents to the ED with c/o blurred vision.  Symptoms started earlier in the afternoon while sitting in the sun.  When she went back inside she noted that despite wearing and cleaning her glasses she couldn't see the TV.  No headache, other neuro symptoms, chest pain.  Patient checked her BP and noted that it was very high ([id number] systolic) which is higher than it has ever been.  She then called EMS.  Patient does report taking all of her BP meds today, no recent med changes except taking a codine cough syrup.  No recent steroids (been a while since she was on prednisone taper), no recent additions of cold medications like sudafed.  ED Course: Given labetalol, SBP now down to 170s and blurry vision has resolved.  Patient currently asymptomatic.  Review of Systems: As per HPI otherwise 10 point review of systems negative.    Past Medical History:  Diagnosis Date  . ALLERGIC RHINITIS   . Atrial fibrillation ([place])   . CANCER, COLORECTAL   . CHEST PAIN, ATYPICAL   . DYSPNEA   . Essential hypertension, benign   . FIBROMYALGIA   . G E R D   . History of stress test    Myoview 8/16:  EF 69%, anterior and apical defect c/w breast attenuation; Low Risk   . INSOMNIA   . Other and unspecified angina pectoris     Past Surgical History:  Procedure Laterality Date  . ABDOMINAL HYSTERECTOMY    . BACK SURGERY    . BREAST SURGERY    . COLON SURGERY    . CORONARY STENT PLACEMENT    . PERCUTANEOUS CORONARY STENT INTERVENTION (PCI-S) N/A 05/19/2013   Procedure: PERCUTANEOUS CORONARY STENT INTERVENTION (PCI-S);  Surgeon: Jettie Booze, MD;  Location: [facility] CATH LAB;  Service: Cardiovascular;  Laterality: N/A;     reports that she quit smoking about 53 years ago. Her smoking use included Cigarettes. She started smoking about 66 years ago. She has a 12.00 pack-year smoking history. She has never used smokeless tobacco. She reports that she does not drink alcohol or use drugs.  Allergies  Allergen Reactions  . Aspirin Anaphylaxis, Hives and Shortness Of Breath  . Hydroxyquinolines Hives  . Imipramine Hcl Other (See Comments)    UNKNOWN TO PATIENT  . Pamelor [Nortriptyline Hcl] Other (See Comments)    unknown  . Prednisone Shortness Of Breath and Itching    anxiety  . Procardia [Nifedipine] Other (See Comments)    Unknown   . Tramadol Swelling  . Ace Inhibitors Other (See Comments)    cough  . Plaquenil [Hydroxychloroquine Sulfate] Rash    Family History  Problem Relation Age of Onset  . CAD Brother   . Heart attack Brother 37  . Bone cancer Sister   . Throat cancer Daughter       Prior to Admission medications   Medication Sig Start Date End Date Taking? Authorizing Provider  albuterol (PROVENTIL) (2.5 MG/3ML) 0.083% nebulizer solution 1 vial in neb every 6 hours and as needed Dx J45.909 Patient taking differently: Take 2.5 mg by nebulization every 4 (four) hours as needed for wheezing or shortness of breath. 1 vial in neb  every 6 hours and as needed Dx J45.909 04/05/16  Yes Deneise Lever, MD  Albuterol Sulfate (PROAIR RESPICLICK) 123XX123 (90 BASE) MCG/ACT AEPB Inhale 1-2 puffs into the lungs as needed (for shortness of breath and/or wheezing.). 01/10/15  Yes Rigoberto Noel, MD  budesonide-formoterol (SYMBICORT) 160-4.5 MCG/ACT inhaler Inhale 2 puffs into the lungs 2 (two) times daily. 06/17/16 08/02/16 Yes Rigoberto Noel, MD  CALCIUM PO Take 1,200 mg by mouth daily.    Yes Historical Provider, MD  clopidogrel (PLAVIX) 75 MG tablet TAKE 1 TABLET(75 MG) BY MOUTH EVERY OTHER DAY 07/02/16  Yes Jettie Booze, MD  Ascorbic Acid (VITAMIN C) 1000 MG tablet Take 1,000 mg by mouth daily.    Historical  Provider, MD  DULoxetine (CYMBALTA) 60 MG capsule Take 60 mg by mouth daily. 05/16/15   Historical Provider, MD  isosorbide mononitrate (IMDUR) 30 MG 24 hr tablet TAKE 1 TABLET(30 MG) BY MOUTH DAILY 07/29/16   Liliane Shi, PA-C  Nebulizers (COMPRESSOR NEBULIZER) MISC 1 Device by Does not apply route once. 04/04/16   Deneise Lever, MD  nitroGLYCERIN (NITROSTAT) 0.4 MG SL tablet Place 1 tablet (0.4 mg total) under the tongue every 5 (five) minutes as needed for chest pain. 07/07/15   Jettie Booze, MD  PRESCRIPTION MEDICATION Apply 1 drop to eye 2 (two) times daily.    Historical Provider, MD  valsartan-hydrochlorothiazide (DIOVAN-HCT) 160-25 MG tablet Take 1 tablet by mouth daily.    Historical Provider, MD    Physical Exam: Vitals:   08/03/16 0000 08/03/16 0030 08/03/16 0100 08/03/16 0130  BP: (!) 210/82 (!) 171/47 (!) 180/47 (!) 178/46  Pulse: 83 69 63 69  Resp: 18 22 17 22   Temp:      TempSrc:      SpO2: 96% 93% 94% 93%      Constitutional: NAD, calm, comfortable Eyes: PERRL, lids and conjunctivae normal ENMT: Mucous membranes are moist. Posterior pharynx clear of any exudate or lesions.Normal dentition.  Neck: normal, supple, no masses, no thyromegaly Respiratory: clear to auscultation bilaterally, no wheezing, no crackles. Normal respiratory effort. No accessory muscle use.  Cardiovascular: Regular rate and rhythm, no murmurs / rubs / gallops. No extremity edema. 2+ pedal pulses. No carotid bruits.  Abdomen: no tenderness, no masses palpated. No hepatosplenomegaly. Bowel sounds positive.  Musculoskeletal: no clubbing / cyanosis. No joint deformity upper and lower extremities. Good ROM, no contractures. Normal muscle tone.  Skin: no rashes, lesions, ulcers. No induration Neurologic: CN 2-12 grossly intact. Sensation intact, DTR normal. Strength 5/5 in all 4.  Psychiatric: Normal judgment and insight. Alert and oriented x 3. Normal mood.    Labs on Admission: I have  personally reviewed following labs and imaging studies  CBC:  Recent Labs Lab 08/03/16 0011  WBC 10.6*  NEUTROABS 7.2  HGB 12.2  HCT 37.1  MCV 94.2  PLT 0000000   Basic Metabolic Panel:  Recent Labs Lab 08/03/16 0011  NA 134*  K 4.0  CL 101  CO2 25  GLUCOSE 117*  BUN 23*  CREATININE 1.03*  CALCIUM 9.3   GFR: CrCl cannot be calculated (Unknown ideal weight.). Liver Function Tests: No results for input(s): AST, ALT, ALKPHOS, BILITOT, PROT, ALBUMIN in the last 168 hours. No results for input(s): LIPASE, AMYLASE in the last 168 hours. No results for input(s): AMMONIA in the last 168 hours. Coagulation Profile: No results for input(s): INR, PROTIME in the last 168 hours. Cardiac Enzymes: No results for input(s): CKTOTAL,  CKMB, CKMBINDEX, TROPONINI in the last 168 hours. BNP (last 3 results) No results for input(s): PROBNP in the last 8760 hours. HbA1C: No results for input(s): HGBA1C in the last 72 hours. CBG: No results for input(s): GLUCAP in the last 168 hours. Lipid Profile: No results for input(s): CHOL, HDL, LDLCALC, TRIG, CHOLHDL, LDLDIRECT in the last 72 hours. Thyroid Function Tests: No results for input(s): TSH, T4TOTAL, FREET4, T3FREE, THYROIDAB in the last 72 hours. Anemia Panel: No results for input(s): VITAMINB12, FOLATE, FERRITIN, TIBC, IRON, RETICCTPCT in the last 72 hours. Urine analysis:    Component Value Date/Time   COLORURINE YELLOW 07/01/2015 1850   APPEARANCEUR CLOUDY (A) 07/01/2015 1850   LABSPEC 1.012 07/01/2015 1850   PHURINE 6.5 07/01/2015 1850   GLUCOSEU NEGATIVE 07/01/2015 1850   HGBUR TRACE (A) 07/01/2015 1850   BILIRUBINUR NEGATIVE 07/01/2015 1850   KETONESUR NEGATIVE 07/01/2015 1850   PROTEINUR NEGATIVE 07/01/2015 1850   UROBILINOGEN 0.2 07/01/2015 1850   NITRITE POSITIVE (A) 07/01/2015 1850   LEUKOCYTESUR SMALL (A) 07/01/2015 1850   Sepsis Labs: @LABRCNTIP (procalcitonin:4,lacticidven:4) )No results found for this or any  previous visit (from the past 240 hour(s)).   Radiological Exams on Admission: No results found.  EKG: Independently reviewed.  Assessment/Plan Active Problems:   Hypertensive urgency    1. HTN urgency - 1. Will check CT head 2. Resume home BP meds 3. PRN labetalol 4. Tele monitor   DVT prophylaxis: Lovenox Code Status: Full Family Communication: No family in room Consults called: None Admission status: Admit to obs   Jordann Grime, [facility] Pager [phone] from 7PM-7AM  If 7AM-7PM, please contact the day physician for the patient [email or web address] Password [email or web address]  08/03/2016, 2:05 AM

## 2016-08-03 NOTE — Progress Notes (Signed)
  PROGRESS NOTE  Sheryl Curtis  [id number] DOB: Jan 19, 1932 DOA: 08/02/2016  Sheryl Curtis is a 82 y.o. female admitted early this morning with hypertensive urgency. Labetalol IV x1 provided improved BP control and resolution of blurry vision. She denies related symptoms this morning.  Hypertensive urgency:  - Resume home BP meds this AM - Continue labetalol IV prn - Goal BP lowering is 25% (SBP ~160's) today with further reduction tomorrow - Anticipate discharge home tomorrow with PCP follow up.  - Daughter, [name], updated in the room  Chronic hip pain: Due to OA, bursitis, and fibromyalgia. - Ordered oxycodone prn  Subjective: Pt reports resolution of blurry vision, no HA, chest pain, dyspnea, palpitations, leg swelling, orthopnea. Has chronic right hip pain that is uncontrolled with norco. She reports norco was recently changed to oxycodone by her pain medicine doctor.  Objective: BP (!) 144/59 (BP Location: Left Arm)   Pulse 62   Temp 98 F (36.7 C) (Oral)   Resp 18   Ht 5\' 4"  (1.626 m)   Wt 89.4 kg (197 lb 3.2 oz)   SpO2 100%   BMI 33.85 kg/m   General exam: 82 y.o. female in no distress Respiratory system: Non-labored breathing. Clear to auscultation bilaterally.  Cardiovascular system: Regular rate and rhythm. No murmur, rub, or gallop. No JVD, and no pedal edema. Gastrointestinal system: Abdomen soft, non-tender, non-distended, with normoactive bowel sounds. No organomegaly or masses felt. Central nervous system: Alert and oriented. No focal neurological deficits. Visual acuity normal.  CBC:  Recent Labs Lab 08/03/16 0011  WBC 10.6*  NEUTROABS 7.2  HGB 12.2  HCT 37.1  MCV 94.2  PLT 0000000   Basic Metabolic Panel:  Recent Labs Lab 08/03/16 0011  NA 134*  K 4.0  CL 101  CO2 25  GLUCOSE 117*  BUN 23*  CREATININE 1.03*  CALCIUM 9.3    Radiology Studies: Ct Head Wo Contrast  Result Date: 08/03/2016 CLINICAL DATA:  Acute onset of high blood pressure  and blurred vision. Initial encounter. EXAM: CT HEAD WITHOUT CONTRAST TECHNIQUE: Contiguous axial images were obtained from the base of the skull through the vertex without intravenous contrast. COMPARISON:  Paranasal sinus CT performed 09/17/2007 FINDINGS: Brain: No evidence of acute infarction, hemorrhage, hydrocephalus, extra-axial collection or mass lesion/mass effect. The posterior fossa, including the cerebellum, brainstem and fourth ventricle, is within normal limits. The third and lateral ventricles, and basal ganglia are unremarkable in appearance. The cerebral hemispheres are symmetric in appearance, with normal gray-white differentiation. No mass effect or midline shift is seen. Vascular: No hyperdense vessel or unexpected calcification. Skull: There is no evidence of fracture; visualized osseous structures are unremarkable in appearance. Sinuses/Orbits: The orbits are within normal limits. The paranasal sinuses and mastoid air cells are well-aerated. Other: No significant soft tissue abnormalities are seen. IMPRESSION: Unremarkable noncontrast CT of the head. Electronically Signed   By: Garald Balding M.D.   On: 08/03/2016 03:28    Vance Gather, MD [facility] Pager [phone]  If 7PM-7AM, please contact night-coverage [email or web address] Password [email or web address] 08/03/2016, 1:42 PM

## 2016-08-03 NOTE — Progress Notes (Signed)
1108 pt with 6 beat run of vtach.  Asymptomatic.  MD notified.

## 2016-08-03 NOTE — Plan of Care (Signed)
Problem: Pain Managment: Goal: General experience of comfort will improve Outcome: Progressing C/o pain 8/10 to right hip.   Vicodin given, ineffective. Percocet given with better relief.

## 2016-08-03 NOTE — ED Notes (Signed)
Pt. Daughters cell phone number [phone].

## 2016-08-04 DIAGNOSIS — G8929 Other chronic pain: Secondary | ICD-10-CM

## 2016-08-04 DIAGNOSIS — M25551 Pain in right hip: Secondary | ICD-10-CM

## 2016-08-04 DIAGNOSIS — I160 Hypertensive urgency: Secondary | ICD-10-CM

## 2016-08-04 MED ORDER — AMLODIPINE BESYLATE 10 MG PO TABS
10.0000 mg | ORAL_TABLET | Freq: Every day | ORAL | Status: DC
  Administered 2016-08-04: 10 mg via ORAL
  Filled 2016-08-04: qty 1

## 2016-08-04 MED ORDER — METOPROLOL SUCCINATE ER 25 MG PO TB24: 25.0000 mg | ORAL_TABLET | Freq: Every day | ORAL | Status: DC

## 2016-08-04 MED ORDER — AMLODIPINE BESYLATE 10 MG PO TABS: 10.0000 mg | ORAL_TABLET | Freq: Every day | ORAL | Status: DC

## 2016-08-04 MED ORDER — AMLODIPINE BESYLATE 10 MG PO TABS
10.0000 mg | ORAL_TABLET | Freq: Every day | ORAL | Status: DC
  Filled 2016-08-04: qty 1

## 2016-08-04 NOTE — Progress Notes (Signed)
I assumed care of this patient at 1700.  I agree with the previous nurses assessment.

## 2016-08-04 NOTE — Progress Notes (Signed)
BP 193/53.  Gave 20mg  Labetalol.  BP 126/44.  Hold Amlodipine.  Will continue to monitor.

## 2016-08-04 NOTE — Progress Notes (Signed)
PROGRESS NOTE  Sheryl Curtis  [id number] DOB: [birth year] DOA: 08/02/2016 PCP: Donnie Coffin, MD   Brief Narrative: Sheryl Curtis is a 82 y.o. female admitted 10/7 with hypertensive urgency with blurry vision. BPs have been brought down steadily over 24 hours with resolution of blurry vision. BP began returning to severe range on the morning of 10/8 prior to receiving AM medications. Labetalol 20mg  IV x1 was given and provided improved control. Once oral home medications took effect BP dropped to 126/44 with malaise as only symptom. No light-headedness.   Assessment & Plan: Active Problems:   Hypertensive urgency  Hypertensive urgency:  - Resume home BP meds this AM. Suspect late dose effect causing uncontrolled severe HTN to return prior to next daily dose. Will start amlodipine (has been on and tolerated this in the past) qHS.  - Addition options would include increasing ARB or HCTZ. Would avoid beta blocker, as this has caused bradycardia during this admission, and metoprolol has done this in the past as well.  - Discontinued prn IV labetalol. Asked RN to call if BP returns to severe range for orders.  Chronic hip pain: Due to OA, bursitis, and fibromyalgia. - Ordered oxycodone prn  DVT prophylaxis: Lovenox Code Status: Full Family Communication: Discussed plan with Letta Median and [name], daughters at bedside. Disposition Plan: Observe labile HTN today, start amlodipine this evening.   Consultants:   None  Procedures:   None  Antimicrobials:  None   Subjective:  Pr complains of trouble sleeping in hospital. No blurry vision, no HA, chest pain, dyspnea, palpitations, leg swelling, orthopnea. Has chronic right hip pain that is better controlled with percocet prn. Did begin to feel "off" after administration of IV beta blocker and all home meds this AM. No syncope or lightheadedness.   Objective: Vitals:   08/04/16 1207 08/04/16 1318 08/04/16 1456 08/04/16 1518  BP: (!) 126/44  (!) 129/49 (!) 94/40 (!) 138/48  Pulse:  (!) 57 79 (!) 57  Resp:      Temp:   98.2 F (36.8 C)   TempSrc:   Oral   SpO2:   100%   Weight:      Height:        Intake/Output Summary (Last 24 hours) at 08/04/16 1520 Last data filed at 08/03/16 2025  Gross per 24 hour  Intake              480 ml  Output                0 ml  Net              480 ml   Filed Weights   08/03/16 0228  Weight: 89.4 kg (197 lb 3.2 oz)    Examination: General exam: 82 y.o. female in no distress Respiratory system: Non-labored breathing. Clear to auscultation bilaterally.  Cardiovascular system: Regular rate and rhythm. No murmur, rub, or gallop. No JVD, and no pedal edema. Gastrointestinal system: Abdomen soft, non-tender, non-distended, with normoactive bowel sounds. No organomegaly or masses felt. Central nervous system: Alert and oriented. No focal neurological deficits. Extremities: Warm, no deformities Skin: No rashes, lesions, ulcers Psychiatry: Judgement and insight appear normal. Mood & affect appropriate.   Data Reviewed: I have personally reviewed following labs and imaging studies  CBC:  Recent Labs Lab 08/03/16 0011  WBC 10.6*  NEUTROABS 7.2  HGB 12.2  HCT 37.1  MCV 94.2  PLT 0000000   Basic Metabolic Panel:  Recent Labs Lab 08/03/16  0011  NA 134*  K 4.0  CL 101  CO2 25  GLUCOSE 117*  BUN 23*  CREATININE 1.03*  CALCIUM 9.3   GFR: Estimated Creatinine Clearance: 45.6 mL/min (by C-G formula based on SCr of 1.03 mg/dL (H)). Liver Function Tests: No results for input(s): AST, ALT, ALKPHOS, BILITOT, PROT, ALBUMIN in the last 168 hours. No results for input(s): LIPASE, AMYLASE in the last 168 hours. No results for input(s): AMMONIA in the last 168 hours. Coagulation Profile: No results for input(s): INR, PROTIME in the last 168 hours. Cardiac Enzymes: No results for input(s): CKTOTAL, CKMB, CKMBINDEX, TROPONINI in the last 168 hours. BNP (last 3 results) No results for  input(s): PROBNP in the last 8760 hours. HbA1C: No results for input(s): HGBA1C in the last 72 hours. CBG: No results for input(s): GLUCAP in the last 168 hours. Lipid Profile: No results for input(s): CHOL, HDL, LDLCALC, TRIG, CHOLHDL, LDLDIRECT in the last 72 hours. Thyroid Function Tests: No results for input(s): TSH, T4TOTAL, FREET4, T3FREE, THYROIDAB in the last 72 hours. Anemia Panel: No results for input(s): VITAMINB12, FOLATE, FERRITIN, TIBC, IRON, RETICCTPCT in the last 72 hours. Urine analysis:    Component Value Date/Time   COLORURINE YELLOW 07/01/2015 1850   APPEARANCEUR CLOUDY (A) 07/01/2015 1850   LABSPEC 1.012 07/01/2015 1850   PHURINE 6.5 07/01/2015 1850   GLUCOSEU NEGATIVE 07/01/2015 1850   HGBUR TRACE (A) 07/01/2015 1850   BILIRUBINUR NEGATIVE 07/01/2015 1850   KETONESUR NEGATIVE 07/01/2015 1850   PROTEINUR NEGATIVE 07/01/2015 1850   UROBILINOGEN 0.2 07/01/2015 1850   NITRITE POSITIVE (A) 07/01/2015 1850   LEUKOCYTESUR SMALL (A) 07/01/2015 1850   Sepsis Labs: @LABRCNTIP (procalcitonin:4,lacticidven:4)  )No results found for this or any previous visit (from the past 240 hour(s)).   Radiology Studies: Ct Head Wo Contrast  Result Date: 08/03/2016 CLINICAL DATA:  Acute onset of high blood pressure and blurred vision. Initial encounter. EXAM: CT HEAD WITHOUT CONTRAST TECHNIQUE: Contiguous axial images were obtained from the base of the skull through the vertex without intravenous contrast. COMPARISON:  Paranasal sinus CT performed 09/17/2007 FINDINGS: Brain: No evidence of acute infarction, hemorrhage, hydrocephalus, extra-axial collection or mass lesion/mass effect. The posterior fossa, including the cerebellum, brainstem and fourth ventricle, is within normal limits. The third and lateral ventricles, and basal ganglia are unremarkable in appearance. The cerebral hemispheres are symmetric in appearance, with normal gray-white differentiation. No mass effect or midline  shift is seen. Vascular: No hyperdense vessel or unexpected calcification. Skull: There is no evidence of fracture; visualized osseous structures are unremarkable in appearance. Sinuses/Orbits: The orbits are within normal limits. The paranasal sinuses and mastoid air cells are well-aerated. Other: No significant soft tissue abnormalities are seen. IMPRESSION: Unremarkable noncontrast CT of the head. Electronically Signed   By: Garald Balding M.D.   On: 08/03/2016 03:28    Scheduled Meds: . amLODipine  10 mg Oral QHS  . clopidogrel  75 mg Oral QODAY  . DULoxetine  60 mg Oral Daily  . enoxaparin (LOVENOX) injection  40 mg Subcutaneous Q24H  . irbesartan  150 mg Oral Daily   And  . hydrochlorothiazide  25 mg Oral Daily  . isosorbide mononitrate  30 mg Oral Daily  . mometasone-formoterol  2 puff Inhalation BID   Continuous Infusions:    LOS: 0 days   Time spent: 25 minutes.  Vance Gather, MD [facility] Pager [phone]  If 7PM-7AM, please contact night-coverage [email or web address] Password [email or web address] 08/04/2016, 3:20 PM

## 2016-08-05 DIAGNOSIS — I160 Hypertensive urgency: Secondary | ICD-10-CM | POA: Diagnosis not present

## 2016-08-05 DIAGNOSIS — R001 Bradycardia, unspecified: Secondary | ICD-10-CM | POA: Diagnosis not present

## 2016-08-05 LAB — BASIC METABOLIC PANEL
Anion gap: 6 (ref 5–15)
BUN: 20 mg/dL (ref 6–20)
CO2: 28 mmol/L (ref 22–32)
Calcium: 8.9 mg/dL (ref 8.9–10.3)
Chloride: 97 mmol/L — ABNORMAL LOW (ref 101–111)
Creatinine, Ser: 1.21 mg/dL — ABNORMAL HIGH (ref 0.44–1.00)
GFR calc Af Amer: 47 mL/min — ABNORMAL LOW (ref >60)
GFR calc non Af Amer: 41 mL/min — ABNORMAL LOW (ref >60)
Glucose, Bld: 99 mg/dL (ref 65–99)
Potassium: 4.7 mmol/L (ref 3.5–5.1)
Sodium: 131 mmol/L — ABNORMAL LOW (ref 135–145)

## 2016-08-05 MED ORDER — AMLODIPINE BESYLATE 10 MG PO TABS: 10.0000 mg | ORAL_TABLET | Freq: Every day | ORAL | 0 refills | Status: DC

## 2016-08-05 NOTE — Discharge Summary (Signed)
Physician Discharge Summary  Sheryl Curtis [id number] DOB: Sep 12, 1932 DOA: 08/02/2016  PCP: Donnie Coffin, MD  Admit date: 08/02/2016 Discharge date: 08/05/2016  Admitted From: Home Disposition: Home   Recommendations for Outpatient Follow-up:  1. Follow up with PCP in 1-2 weeks to monitor blood pressure. Amlodipine 10mg  was added at night to treat uncontrolled HTN that worsened prior to AM medication dosing.  Home Health: None Equipment/Devices: None  Discharge Condition: Stable CODE STATUS: Full Diet recommendation: Low sodium  Brief/Interim Summary: Sheryl Curtis is a 82 y.o. female admitted 10/7 with hypertensive urgency with blurry vision. BPs have been brought down steadily over 24 hours with resolution of blurry vision. BP began returning to severe range on the morning of 10/8 prior to receiving AM medications. Labetalol 20mg  IV x1 was given and provided improved control but with bradycardia. Once oral home medications took effect BP dropped to 126/44 with malaise as only symptom. No light-headedness. She was observed another 24 hours and norvasc 10mg  was given qHS with BP range (132 - 153) / (38 - 65), and remained asymptomatic. No adverse effects of amlodipine were noticed while in the hospital. She was discharged on her home regimen and additional nightly norvasc pending follow up with PCP or cardiology.   Discharge Diagnoses:  Principal Problem:   Hypertensive urgency Active Problems:   Essential hypertension   G E R D   Hyponatremia   Chronic kidney disease (CKD), stage III (moderate)   Generalized OA   Sinus bradycardia  Hypertensive urgency:  - Resume home BP meds this AM. Suspect late dose effect causing uncontrolled severe HTN to return prior to next daily dose.  - Added norvasc: Warned of possible side effects including LE swelling. Had reaction to nifedipine in the past but denies that it was anaphylaxis. - Addition options would include increasing ARB or HCTZ.  Would avoid beta blocker, as this has caused bradycardia during this admission, and metoprolol has done this in the past as well.   Discharge Instructions Discharge Instructions    Diet - low sodium heart healthy    Complete by:  As directed    Discharge instructions    Complete by:  As directed    You were admitted for uncontrolled hypertension in a severe range and blurry vision - a condition called hypertensive urgency. Your blood pressure was controlled with the addition of amlodipine (norvasc) 10mg  at night time.  - You need to continue taking norvasc nightly starting this evening - Continue monitoring your blood pressure. Let Dr. Alroy Dust know if your blood pressure rises > 180/100 and remains there for an hour or more. Skip a dose if your blood pressure drops below 100/40 and call Dr. Alroy Dust.  - Follow up with Dr. Alroy Dust within the next 1-2 weeks.  - If blurry vision, chest pain, shortness of breath, severe headache, or severe light headedness develops seek medical attention right away.   Increase activity slowly    Complete by:  As directed        Medication List    TAKE these medications   Albuterol Sulfate 108 (90 Base) MCG/ACT Aepb Commonly known as:  PROAIR RESPICLICK Inhale 1-2 puffs into the lungs as needed (for shortness of breath and/or wheezing.). What changed:  when to take this   albuterol (2.5 MG/3ML) 0.083% nebulizer solution Commonly known as:  PROVENTIL 1 vial in neb every 6 hours and as needed Dx J45.909 What changed:  how much to take  how to take  this  when to take this  reasons to take this  additional instructions   amLODipine 10 MG tablet Commonly known as:  NORVASC Take 1 tablet (10 mg total) by mouth at bedtime.   budesonide-formoterol 160-4.5 MCG/ACT inhaler Commonly known as:  SYMBICORT Inhale 2 puffs into the lungs 2 (two) times daily.   CALCIUM PO Take 1,200 mg by mouth daily.   clopidogrel 75 MG tablet Commonly known as:   PLAVIX TAKE 1 TABLET(75 MG) BY MOUTH EVERY OTHER DAY   Compressor Nebulizer Misc 1 Device by Does not apply route once.   diclofenac sodium 1 % Gel Commonly known as:  VOLTAREN APP THIN LAYER EXTERNALLY TO AFFECT AREA(S) UP TO FOUR TIMES DAILY AS NEEDED FOR PAIN   DULoxetine 60 MG capsule Commonly known as:  CYMBALTA Take 60 mg by mouth daily.   isosorbide mononitrate 30 MG 24 hr tablet Commonly known as:  IMDUR TAKE 1 TABLET(30 MG) BY MOUTH DAILY   meloxicam 7.5 MG tablet Commonly known as:  MOBIC Take 7.5 mg by mouth 2 (two) times daily as needed for pain (and inflammation).   nitroGLYCERIN 0.4 MG SL tablet Commonly known as:  NITROSTAT Place 1 tablet (0.4 mg total) under the tongue every 5 (five) minutes as needed for chest pain.   Oxycodone HCl 10 MG Tabs TAKE 10 MG BY MOUTH EVERY 6 HOURS   pantoprazole 40 MG tablet Commonly known as:  PROTONIX Take 40 mg by mouth daily.   tiZANidine 4 MG tablet Commonly known as:  ZANAFLEX TAKE 1 TABLETBY MOUTH EVER 8 HOURS  AS NEEDED FOR SPASMS NOT TO EXCEED  3 DOSES IN 24 HOURS   valsartan-hydrochlorothiazide 160-25 MG tablet Commonly known as:  DIOVAN-HCT Take 1 tablet by mouth daily.   vitamin C 1000 MG tablet Take 1,000 mg by mouth daily.      Follow-up Information    Larae Grooms, MD .   Specialties:  Cardiology, Radiology, Interventional Cardiology Contact information: [id number] [place] [address] [phone]        Donnie Coffin, MD. Schedule an appointment as soon as possible for a visit in 2 week(s).   Specialty:  Family Medicine Contact information: [address] [place] [address] [phone]          Allergies  Allergen Reactions  . Aspirin Anaphylaxis, Hives and Shortness Of Breath  . Hydroxyquinolines Hives  . Imipramine Hcl Other (See Comments)    UNKNOWN TO PATIENT  . Pamelor [Nortriptyline Hcl] Other (See Comments)    unknown  .  Prednisone Shortness Of Breath and Itching    anxiety  . Procardia [Nifedipine] Other (See Comments)    Unknown   . Tramadol Swelling  . Ace Inhibitors Other (See Comments)    cough  . Plaquenil [Hydroxychloroquine Sulfate] Rash    Consultations:  None  Procedures/Studies: Ct Head Wo Contrast  Result Date: 08/03/2016 CLINICAL DATA:  Acute onset of high blood pressure and blurred vision. Initial encounter. EXAM: CT HEAD WITHOUT CONTRAST TECHNIQUE: Contiguous axial images were obtained from the base of the skull through the vertex without intravenous contrast. COMPARISON:  Paranasal sinus CT performed 09/17/2007 FINDINGS: Brain: No evidence of acute infarction, hemorrhage, hydrocephalus, extra-axial collection or mass lesion/mass effect. The posterior fossa, including the cerebellum, brainstem and fourth ventricle, is within normal limits. The third and lateral ventricles, and basal ganglia are unremarkable in appearance. The cerebral hemispheres are symmetric in appearance, with normal gray-white differentiation.  No mass effect or midline shift is seen. Vascular: No hyperdense vessel or unexpected calcification. Skull: There is no evidence of fracture; visualized osseous structures are unremarkable in appearance. Sinuses/Orbits: The orbits are within normal limits. The paranasal sinuses and mastoid air cells are well-aerated. Other: No significant soft tissue abnormalities are seen. IMPRESSION: Unremarkable noncontrast CT of the head. Electronically Signed   By: Garald Balding M.D.   On: 08/03/2016 03:28      Subjective: Pt asymptomatic this morning. No blurry vision, HA, chest pain, dyspnea, light-headedness. Wants to go home.   Discharge Exam: Vitals:   08/05/16 1044 08/05/16 1124  BP:  (!) 144/48  Pulse: 65   Resp: 16   Temp:     Vitals:   08/04/16 2107 08/05/16 0445 08/05/16 1044 08/05/16 1124  BP: (!) 132/38 (!) 153/49  (!) 144/48  Pulse: 60 (!) 57 65   Resp: 18 18 16     Temp: 98.8 F (37.1 C) 97.8 F (36.6 C)    TempSrc: Axillary Oral    SpO2: 96% 99% 98%   Weight:      Height:       General: Pt is alert, awake, not in acute distress Cardiovascular: RRR, S1/S2 +, no rubs, no gallops Respiratory: CTA bilaterally, no wheezing, no rhonchi Abdominal: Soft, NT, ND, bowel sounds + Extremities: no edema, no cyanosis  The results of significant diagnostics from this hospitalization (including imaging, microbiology, ancillary and laboratory) are listed below for reference.    Microbiology: No results found for this or any previous visit (from the past 240 hour(s)).   Labs: BNP (last 3 results) No results for input(s): BNP in the last 8760 hours. Basic Metabolic Panel:  Recent Labs Lab 08/03/16 0011 08/05/16 0424  NA 134* 131*  K 4.0 4.7  CL 101 97*  CO2 25 28  GLUCOSE 117* 99  BUN 23* 20  CREATININE 1.03* 1.21*  CALCIUM 9.3 8.9   Liver Function Tests: No results for input(s): AST, ALT, ALKPHOS, BILITOT, PROT, ALBUMIN in the last 168 hours. No results for input(s): LIPASE, AMYLASE in the last 168 hours. No results for input(s): AMMONIA in the last 168 hours. CBC:  Recent Labs Lab 08/03/16 0011  WBC 10.6*  NEUTROABS 7.2  HGB 12.2  HCT 37.1  MCV 94.2  PLT 249   Cardiac Enzymes: No results for input(s): CKTOTAL, CKMB, CKMBINDEX, TROPONINI in the last 168 hours. BNP: Invalid input(s): POCBNP CBG: No results for input(s): GLUCAP in the last 168 hours. D-Dimer No results for input(s): DDIMER in the last 72 hours. Hgb A1c No results for input(s): HGBA1C in the last 72 hours. Lipid Profile No results for input(s): CHOL, HDL, LDLCALC, TRIG, CHOLHDL, LDLDIRECT in the last 72 hours. Thyroid function studies No results for input(s): TSH, T4TOTAL, T3FREE, THYROIDAB in the last 72 hours.  Invalid input(s): FREET3 Anemia work up No results for input(s): VITAMINB12, FOLATE, FERRITIN, TIBC, IRON, RETICCTPCT in the last 72  hours. Urinalysis    Component Value Date/Time   COLORURINE YELLOW 07/01/2015 1850   APPEARANCEUR CLOUDY (A) 07/01/2015 1850   LABSPEC 1.012 07/01/2015 1850   PHURINE 6.5 07/01/2015 1850   GLUCOSEU NEGATIVE 07/01/2015 1850   HGBUR TRACE (A) 07/01/2015 1850   BILIRUBINUR NEGATIVE 07/01/2015 1850   KETONESUR NEGATIVE 07/01/2015 1850   PROTEINUR NEGATIVE 07/01/2015 1850   UROBILINOGEN 0.2 07/01/2015 1850   NITRITE POSITIVE (A) 07/01/2015 1850   LEUKOCYTESUR SMALL (A) 07/01/2015 1850   Sepsis Labs Invalid input(s): PROCALCITONIN,  WBC,  LACTICIDVEN Microbiology No results found for this or any previous visit (from the past 240 hour(s)).  Time coordinating discharge: Over 30 minutes  Vance Gather, MD  [facility] 08/05/2016, 11:32 AM Pager [phone]  If 7PM-7AM, please contact night-coverage [email or web address] Password [email or web address]

## 2016-08-07 ENCOUNTER — Ambulatory Visit (INDEPENDENT_AMBULATORY_CARE_PROVIDER_SITE_OTHER): Payer: PPO | Admitting: Pulmonary Disease

## 2016-08-07 ENCOUNTER — Encounter: Payer: Self-pay | Admitting: Pulmonary Disease

## 2016-08-07 DIAGNOSIS — J4530 Mild persistent asthma, uncomplicated: Secondary | ICD-10-CM

## 2016-08-07 DIAGNOSIS — I10 Essential (primary) hypertension: Secondary | ICD-10-CM

## 2016-08-07 DIAGNOSIS — R918 Other nonspecific abnormal finding of lung field: Secondary | ICD-10-CM | POA: Diagnosis not present

## 2016-08-07 NOTE — Progress Notes (Signed)
   Subjective:    Patient ID: Sheryl Curtis, female    DOB: [birth year], 82 y.o.   MRN: [id number]  HPI  82 year old remote smoker with severe RA,for FU of pulmonary nodule  She has severe rheumatoid arthritis with previous MTX use.   She has prior history of rectal cancer status post resection in 2007 She underwent stent to LAD in 04/2013.  Echo has shown normal LV function without any evidence of diastolic dysfunction.    08/07/2016  Chief Complaint  Patient presents with  . Follow-up    non-productive cough, no chest tightness; was in hospital last week for high blood pressure   She stopped taking her antihypertensives and was admitted for the hypertensive crisis Restarted on valsartan/HCTZ and amlodipine which she seemed to tolerate in the hospital She reports that she is having a bad day today, blood pressure is low at 104/58 She states that she had the same problems with amlodipine before and does not tolerate this  Her asthma has been well controlled after a flare in 04/2016 when she needed an acute office visit and steroid Dosepak. We reviewed CT chest for pulmonary nodules   Significant tests/ events CT abdomen  03/2014 - showed bilateral noncalcified pulmonary nodules, largest measuring 7 mm in the posterior right lower lobe. This was not noted on a prior CT in 2003. She was placed on Spiriva in 2011  chest CT 05/2014 showed scattered nodules all less than 8 mm. ACE inhibitor stopped in past for cough  CT chest 01/06/15 - Stable appearance of scattered numerous bilateral pulmonary nodules measuring up to 8 mm  Spirometry in 05/2010 showed FEV1 of 76% improvement postbronchodilator to 80%-1.60 with a ratio of 75 suggesting mild restriction. Spirometry 03/2014 FEV1 of 1.25-64%, with ratio 65 and FVC of 1.91-72%.  PFTs - 12/2014 - no airway obstruction, ratio 71, pos BD response 06/2015 Admit for UTI and Sepsis    CT chest 01/2016 previous measured nodules unchanged from  05/2014 . There were new nodule RLL at 47mm .     Review of Systems neg for any significant sore throat, dysphagia, itching, sneezing, nasal congestion or excess/ purulent secretions, fever, chills, sweats, unintended wt loss, pleuritic or exertional cp, hempoptysis, orthopnea pnd or change in chronic leg swelling.   Also denies presyncope, palpitations, heartburn, abdominal pain, nausea, vomiting, diarrhea or change in bowel or urinary habits, dysuria,hematuria, rash, arthralgias, visual complaints, headache, numbness weakness or ataxia.     Objective:   Physical Exam  Gen. Pleasant, obese, in no distress ENT - no lesions, no post nasal drip Neck: No JVD, no thyromegaly, no carotid bruits Lungs: no use of accessory muscles, no dullness to percussion, decreased without rales or rhonchi  Cardiovascular: Rhythm regular, heart sounds  normal, no murmurs or gallops, no peripheral edema Musculoskeletal: No deformities, no cyanosis or clubbing , no tremors       Assessment & Plan:

## 2016-08-07 NOTE — Assessment & Plan Note (Signed)
Check blood pressure twice daily morning and evening Do not take amlodipine tonight if blood pressure less than 120   We will forward to her PCP-may need to change from amlodipine to different medication

## 2016-08-07 NOTE — Patient Instructions (Signed)
Stay on Symbicort CT chest without contrast in April 2018  Check blood pressure twice daily morning and evening Do not take amlodipine tonight if blood pressure less than [id number] systolic

## 2016-08-07 NOTE — Assessment & Plan Note (Signed)
Stay on Symbicort  

## 2016-08-07 NOTE — Assessment & Plan Note (Signed)
CT chest without contrast in April 2018

## 2016-08-09 ENCOUNTER — Telehealth: Payer: Self-pay | Admitting: Pulmonary Disease

## 2016-08-09 NOTE — Telephone Encounter (Signed)
Patient stated that she contacted Dr. Virgilio Belling office and advised them that her blood pressure was over 218 so he sent her some new BP Medication to take (Varapamil 120mg  once daily)  Dr. Alroy Dust is handling patient's bp.  Notes from last OV sent to Dr. Alroy Dust.  Nothing further needed.

## 2016-08-09 NOTE — Telephone Encounter (Signed)
Spoke with the pt  She states that [place] called to check on her BP  It has "been too high"  She states that last night it was [id number] systolic last night  When asked if she called her PCP, she states "no I got busy" I rec that she needs to call them ASAP since they are the ones who manage her BP  She should seek emergent care if BP continues to be this high  I spoke with Sharyn Lull about this, and she wants note forwarded to her since she placed the original call  Will forward to her per her request

## 2016-08-10 ENCOUNTER — Other Ambulatory Visit (INDEPENDENT_AMBULATORY_CARE_PROVIDER_SITE_OTHER): Payer: Self-pay | Admitting: Orthopaedic Surgery

## 2016-08-10 DIAGNOSIS — M5441 Lumbago with sciatica, right side: Secondary | ICD-10-CM

## 2016-08-14 ENCOUNTER — Ambulatory Visit (INDEPENDENT_AMBULATORY_CARE_PROVIDER_SITE_OTHER): Payer: PPO | Admitting: Orthopaedic Surgery

## 2016-08-21 ENCOUNTER — Ambulatory Visit: Payer: PPO | Admitting: Physician Assistant

## 2016-08-21 ENCOUNTER — Ambulatory Visit
Admission: RE | Admit: 2016-08-21 | Discharge: 2016-08-21 | Disposition: A | Discharge status: Home / Self Care | Payer: PPO | Source: Ambulatory Visit | Attending: Orthopaedic Surgery | Admitting: Orthopaedic Surgery

## 2016-08-21 DIAGNOSIS — M5441 Lumbago with sciatica, right side: Secondary | ICD-10-CM

## 2016-08-23 ENCOUNTER — Other Ambulatory Visit: Payer: Self-pay | Admitting: Interventional Cardiology

## 2016-08-23 NOTE — Telephone Encounter (Addendum)
**Note De-Identified  Obfuscation** The pt states that she has been taking Protonix but does not feel that she needs it. I have encouraged her to continue to take as her heart burn/Gerd may return if she does not take. She states that she is going to stop taking Protonix and that if she develops heart burn/gerd she will call us back and ask for a refill. I have advised her that this is ok.

## 2016-08-23 NOTE — Telephone Encounter (Signed)
This medication was discontinued from patients med list at her last office visit with a reason of error. It was put back on the patients med list three months later. Just wanted to clarify that this was indeed an error. Please advise. Thanks, MI

## 2016-08-26 ENCOUNTER — Ambulatory Visit (INDEPENDENT_AMBULATORY_CARE_PROVIDER_SITE_OTHER): Payer: PPO | Admitting: Physician Assistant

## 2016-08-26 ENCOUNTER — Encounter (INDEPENDENT_AMBULATORY_CARE_PROVIDER_SITE_OTHER): Payer: Self-pay | Admitting: Physician Assistant

## 2016-08-26 VITALS — Ht 64.5 in | Wt 195.0 lb

## 2016-08-26 DIAGNOSIS — M545 Low back pain, unspecified: Secondary | ICD-10-CM

## 2016-08-26 NOTE — Progress Notes (Deleted)
Office Visit Note   Patient: Sheryl Curtis           Date of Birth: 28-Nov-1931           MRN: [id number] Visit Date: 08/26/2016              Requested by: L.Donnie Coffin, MD [place] [address] [employer] [address] [place], [place] [address] PCP: Donnie Coffin, MD   Assessment & Plan: Visit Diagnoses: No diagnosis found.  Plan: ***  Follow-Up Instructions: No Follow-up on file.   Orders:  No orders of the defined types were placed in this encounter.  No orders of the defined types were placed in this encounter.     Procedures: No procedures performed   Clinical Data: No additional findings.   Subjective: Chief Complaint  Patient presents with  . Lower Back - Pain    Patient is here to review MRI Lumbar Spine.  She is having low back pain that radiates down the right leg and out the foot.  Denies numbness or tingling.  Patient takes Oxycodone HCL 10mg  prn. It doesn't seem to last long when she takes it. Patient states her blood pressure has been high today. She has been without her Amlodipine for about a week. She has spoken with PCP and is going to get today.    Review of Systems   Objective: Vital Signs: There were no vitals taken for this visit.  Physical Exam  Ortho Exam  Specialty Comments:  No specialty comments available.  Imaging: No results found.   PMFS History: Patient Active Problem List   Diagnosis Date Noted  . Sinus bradycardia 08/05/2016  . Hypertensive urgency 08/03/2016  . Asthma 02/19/2016  . Chronic kidney disease (CKD), stage III (moderate) 07/05/2015  . CAFL (chronic airflow limitation) ([place]) 07/05/2015  . Generalized OA 07/05/2015  . 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 H/O malignant neoplasm of colon 07/05/2015  . Elevated troponin 07/02/2015  . UTI (lower urinary tract infection) 07/01/2015  . Fibromyalgia syndrome 07/01/2015  . Hyponatremia 07/01/2015  . Urinary tract infectious disease   . Pulmonary nodules/lesions,  multiple 04/11/2014  . Abdominal pain, generalized 04/07/2014  . Coronary atherosclerosis of native coronary artery 10/12/2013  . Essential hypertension, benign   . Other and unspecified angina pectoris   . Atrial fibrillation ([place]) 04/19/2010  . ALLERGIC RHINITIS 04/19/2010  . DYSPNEA 04/19/2010  . CHEST PAIN, ATYPICAL 04/19/2010  . CANCER, COLORECTAL 04/18/2010  . Essential hypertension 04/18/2010  . G E R D 04/18/2010  . FIBROMYALGIA 04/18/2010  . INSOMNIA 04/18/2010   Past Medical History:  Diagnosis Date  . ALLERGIC RHINITIS   . Atrial fibrillation ([place])   . CANCER, COLORECTAL   . CHEST PAIN, ATYPICAL   . DYSPNEA   . Essential hypertension, benign   . FIBROMYALGIA   . G E R D   . History of stress test    Myoview 8/16:  EF 69%, anterior and apical defect c/w breast attenuation; Low Risk   . INSOMNIA   . Other and unspecified angina pectoris     Family History  Problem Relation Age of Onset  . CAD Brother   . Heart attack Brother 37  . Bone cancer Sister   . Throat cancer Daughter     Past Surgical History:  Procedure Laterality Date  . ABDOMINAL HYSTERECTOMY    . BACK SURGERY    . BREAST SURGERY    . COLON SURGERY    . CORONARY STENT PLACEMENT    .  PERCUTANEOUS CORONARY STENT INTERVENTION (PCI-S) N/A 05/19/2013   Procedure: PERCUTANEOUS CORONARY STENT INTERVENTION (PCI-S);  Surgeon: Jettie Booze, MD;  Location: [facility] CATH LAB;  Service: Cardiovascular;  Laterality: N/A;   Social History   Occupational History  . Not on file.   Social History Main Topics  . Smoking status: Former Smoker    Packs/day: 1.00    Years: 12.00    Types: Cigarettes    Start date: 10/28/1949    Quit date: 10/28/1962  . Smokeless tobacco: Never Used  . Alcohol use No  . Drug use: No  . Sexual activity: Not on file

## 2016-08-26 NOTE — Progress Notes (Signed)
Office Visit Note   Patient: Sheryl Curtis           Date of Birth: 1932/02/15           MRN: [id number] Visit Date: 08/26/2016              Requested by: L.Sheryl Coffin, MD [facility] [address] [employer] [address] [place], [place] [address] PCP: Sheryl Coffin, MD   Assessment & Plan: Visit Diagnoses: No diagnosis found.  Plan: L4-L5 right foraminal steroid injection. Patient on Plavix and this will have to be stopped prior to epidural steroid injections   Follow-Up Instructions: Return in about 4 weeks (around 09/23/2016).   Orders:  No orders of the defined types were placed in this encounter.  No orders of the defined types were placed in this encounter.     Procedures: No procedures performed   Clinical Data: No additional findings.   Subjective: Chief Complaint  Patient presents with  . Lower Back - Pain    [name] returns today for follow-up of her low back pain with the right leg and right foot radicular syndrome. She states that any back exercises actually makes her pain worse. She's had a previous epidural steroid injections without relief. Here today to review the MRI findings of her lumbar spine.    Review of Systems   Objective: Vital Signs: Ht 5' 4.5" (1.638 m)   Wt 195 lb (88.5 kg)   BMI 32.95 kg/m   Physical Exam well-developed well-nourished female in no acute distress making paper.  Ortho Exam: Tght hamstrings bilaterally. Right straight leg raise positive.  Specialty Comments:  No specialty comments available.  Imaging: MRI of the lumbar spine dated 08/21/16 decompression lumbar spine stable degenerative changes noted. No canal stenosis. Neural foraminal narrowing at all of the lumbar levelssevere on the right at L45 and left at L5-S1. 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     PMFS History: Patient Active Problem List   Diagnosis Date Noted  . Sinus bradycardia 08/05/2016  . Hypertensive  urgency 08/03/2016  . Asthma 02/19/2016  . Chronic kidney disease (CKD), stage III (moderate) 07/05/2015  . CAFL (chronic airflow limitation) ([place]) 07/05/2015  . Generalized OA 07/05/2015  . 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 H/O malignant neoplasm of colon 07/05/2015  . Elevated troponin 07/02/2015  . UTI (lower urinary tract infection) 07/01/2015  . Fibromyalgia syndrome 07/01/2015  . Hyponatremia 07/01/2015  . Urinary tract infectious disease   . Pulmonary nodules/lesions, multiple 04/11/2014  . Abdominal pain, generalized 04/07/2014  . Coronary atherosclerosis of native coronary artery 10/12/2013  . Essential hypertension, benign   . Other and unspecified angina pectoris   . Atrial fibrillation ([place]) 04/19/2010  . ALLERGIC RHINITIS 04/19/2010  . DYSPNEA 04/19/2010  . CHEST PAIN, ATYPICAL 04/19/2010  . CANCER, COLORECTAL 04/18/2010  . Essential hypertension 04/18/2010  . G E R D 04/18/2010  . FIBROMYALGIA 04/18/2010  . INSOMNIA 04/18/2010   Past Medical History:  Diagnosis Date  . ALLERGIC RHINITIS   . Atrial fibrillation ([place])   . CANCER, COLORECTAL   . CHEST PAIN, ATYPICAL   . DYSPNEA   . Essential hypertension, benign   . FIBROMYALGIA   . G E R D   . History of stress test    Myoview 8/16:  EF 69%, anterior and apical defect c/w breast attenuation; Low Risk   . INSOMNIA   . Other and unspecified angina pectoris  Family History  Problem Relation Age of Onset  . CAD Brother   . Heart attack Brother 64  . Bone cancer Sister   . Throat cancer Daughter     Past Surgical History:  Procedure Laterality Date  . ABDOMINAL HYSTERECTOMY    . BACK SURGERY    . BREAST SURGERY    . COLON SURGERY    . CORONARY STENT PLACEMENT    . PERCUTANEOUS CORONARY STENT INTERVENTION (PCI-S) N/A 05/19/2013   Procedure: PERCUTANEOUS CORONARY STENT INTERVENTION (PCI-S);  Surgeon: Jettie Booze, MD;  Location: [facility] CATH LAB;  Service: Cardiovascular;  Laterality: N/A;    Social History   Occupational History  . Not on file.   Social History Main Topics  . Smoking status: Former Smoker    Packs/day: 1.00    Years: 12.00    Types: Cigarettes    Start date: 10/28/1949    Quit date: 10/28/1962  . Smokeless tobacco: Never Used  . Alcohol use No  . Drug use: No  . Sexual activity: Not on file

## 2016-09-03 ENCOUNTER — Ambulatory Visit (INDEPENDENT_AMBULATORY_CARE_PROVIDER_SITE_OTHER): Payer: Self-pay

## 2016-09-03 ENCOUNTER — Other Ambulatory Visit: Payer: Self-pay | Admitting: Interventional Cardiology

## 2016-09-03 ENCOUNTER — Telehealth (INDEPENDENT_AMBULATORY_CARE_PROVIDER_SITE_OTHER): Payer: Self-pay

## 2016-09-03 NOTE — Telephone Encounter (Signed)
We are trying to schedule this patient for an ESI with Dr. Ernestina Patches in our office and are needing your ok for her to come off her Plavix 7 days prior. Please let me know if this is ok?

## 2016-09-03 NOTE — Telephone Encounter (Signed)
OK to hold Plavix for 7 days prior since stent was placed in 2014.

## 2016-09-10 ENCOUNTER — Ambulatory Visit (INDEPENDENT_AMBULATORY_CARE_PROVIDER_SITE_OTHER): Payer: PPO | Admitting: Physical Medicine and Rehabilitation

## 2016-09-10 ENCOUNTER — Encounter (INDEPENDENT_AMBULATORY_CARE_PROVIDER_SITE_OTHER): Payer: Self-pay | Admitting: Physical Medicine and Rehabilitation

## 2016-09-10 VITALS — BP 134/49 | HR 68

## 2016-09-10 DIAGNOSIS — M5416 Radiculopathy, lumbar region: Secondary | ICD-10-CM | POA: Diagnosis not present

## 2016-09-10 MED ORDER — METHYLPREDNISOLONE ACETATE 80 MG/ML IJ SUSP
80.0000 mg | Freq: Once | INTRAMUSCULAR | Status: AC
  Administered 2016-09-10: 80 mg

## 2016-09-10 MED ORDER — LIDOCAINE HCL (PF) 1 % IJ SOLN
0.3300 mL | Freq: Once | INTRAMUSCULAR | Status: AC
Start: 2016-09-10 — End: 2016-09-10
  Administered 2016-09-10: 0.3 mL

## 2016-09-10 NOTE — Progress Notes (Signed)
Sheryl Curtis - 82 y.o. female MRN [id number]  Date of birth: 11/19/1931  Office Visit Note: Visit Date: 09/10/2016 PCP: Donnie Coffin, MD Referred by: Alroy Dust, L.Marlou Sa, MD  Subjective: Chief Complaint  Patient presents with  . Lower Back - Pain   HPI: Sheryl Curtis is an 82 year old female who has had off and on back pain for over 20 years. Persistent pain for around 6 months. Pain across back and radiating down right leg to foot. "pulling grabbing pain" at times with certain movements. Tingling down leg. The pain classically is in more of an L5 distribution. MRI of the lumbar spine shows more right foraminal narrowing with nerve root sheath nodule on the right at L4. Not really much on the right at L5 or in the lateral recess or central stenosis. Injections by Dr. Maryjean Ka were only effective in anesthetic portion. We do not have his notes to do exactly which levels injected.  Pt stopped taking Plavix on 11/6    ROS Otherwise per HPI.  Assessment & Plan: Visit Diagnoses:  1. Lumbar radiculopathy     Plan: Findings:  Plan today is to inject the right L4 transforaminally. I did discuss with her allergy to prednisone. It's more of an intolerance. She's done okay with cortisone type injections with Depo-Medrol.    Meds & Orders:  Meds ordered this encounter  Medications  . lidocaine (PF) (XYLOCAINE) 1 % injection 0.3 mL  . methylPREDNISolone acetate (DEPO-MEDROL) injection 80 mg    Orders Placed This Encounter  Procedures  . Epidural Steroid injection    Follow-up: Return for Follow-up with Dr. Ninfa Linden as scheduled for recheck spine.   Procedures: No procedures performed  Lumbosacral Transforaminal Epidural Steroid Injection - Infraneural Approach with Fluoroscopic Guidance  Patient: Sheryl Curtis      Date of Birth: 21-Nov-1931 MRN: [id number] PCP: Donnie Coffin, MD      Visit Date: 09/10/2016   Universal Protocol:    Date/Time: 11/14/171:56 PM  Consent Given By: the  patient  Position: PRONE   Additional Comments: Vital signs were monitored before and after the procedure. Patient was prepped and draped in the usual sterile fashion. The correct patient, procedure, and site was verified.   Injection Procedure Details:  Procedure Site One Meds Administered:  Meds ordered this encounter  Medications  . lidocaine (PF) (XYLOCAINE) 1 % injection 0.3 mL  . methylPREDNISolone acetate (DEPO-MEDROL) injection 80 mg      Laterality: Right  Location/Site:  L4-L5  Needle size: 22 G  Needle type: Spinal  Needle Placement: Transforaminal  Findings:  -Contrast Used: 1 mL iohexol 180 mg iodine/mL   -Comments: Excellent flow of contrast along the nerve and into the epidural space.  Procedure Details: After squaring off the end-plates of the desired vertebral level to get a true AP view, the C-arm was obliqued to the painful side so that the superior articulating process is positioned about 1/3 the length of the inferior endplate.  The needle was aimed toward the junction of the superior articular process and the transverse process of the inferior vertebrae. The needle's initial entry is in the lower third of the foramen through Kambin's triangle. The soft tissues overlying this target were infiltrated with 2-3 ml. of 1% Lidocaine without Epinephrine.  The spinal needle was then inserted and advanced toward the target using a "trajectory" view along the fluoroscope beam.  Under AP and lateral visualization, the needle was advanced so it did not puncture dura and did not  traverse medially beyond the 6 o'clock position of the pedicle. Bi-planar projections were used to confirm position. Aspiration was confirmed to be negative for CSF and/or blood. A 1-2 ml. volume of Isovue-250 was injected and flow of contrast was noted at each level. Radiographs were obtained for documentation purposes.   After attaining the desired flow of contrast documented above, a 0.5 to  1.0 ml test dose of 0.25% Marcaine was injected into each respective transforaminal space.  The patient was observed for 90 seconds post injection.  After no sensory deficits were reported, and normal lower extremity motor function was noted,   the above injectate was administered so that equal amounts of the injectate were placed at each foramen (level) into the transforaminal epidural space.   Additional Comments:  The patient tolerated the procedure well Dressing: Band-Aid    Post-procedure details: Patient was observed during the procedure. Post-procedure instructions were reviewed.  Patient left the clinic in stable condition.   Clinical History: No specialty comments available.  She reports that she quit smoking about 53 years ago. Her smoking use included Cigarettes. She started smoking about 66 years ago. She has a 12.00 pack-year smoking history. She has never used smokeless tobacco. No results for input(s): HGBA1C, LABURIC in the last 8760 hours.  Objective:  VS:  HT:    WT:   BMI:     BP:(!) 134/49  HR:68bpm  TEMP: ( )  RESP:97 % Physical Exam  Constitutional: She is oriented to person, place, and time. She appears well-developed and well-nourished.  Eyes: Conjunctivae and EOM are normal. Pupils are equal, round, and reactive to light.  Cardiovascular: Normal rate and intact distal pulses.   Pulmonary/Chest: Effort normal.  Musculoskeletal:  The patient relates without aid with a fairly normal gait. She has good distal strength.  Neurological: She is alert and oriented to person, place, and time.  She was alert but drowsy.  Skin: Skin is warm and dry. No erythema.  Psychiatric: She has a normal mood and affect. Her behavior is normal.  Nursing note and vitals reviewed.   Ortho Exam Imaging: No results found.  Past Medical/Family/Surgical/Social History: Medications & Allergies reviewed per EMR Patient Active Problem List   Diagnosis Date Noted  . Sinus  bradycardia 08/05/2016  . Hypertensive urgency 08/03/2016  . Asthma 02/19/2016  . Chronic kidney disease (CKD), stage III (moderate) 07/05/2015  . CAFL (chronic airflow limitation) ([place]) 07/05/2015  . Generalized OA 07/05/2015  . 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 H/O malignant neoplasm of colon 07/05/2015  . Elevated troponin 07/02/2015  . UTI (lower urinary tract infection) 07/01/2015  . Fibromyalgia syndrome 07/01/2015  . Hyponatremia 07/01/2015  . Urinary tract infectious disease   . Pulmonary nodules/lesions, multiple 04/11/2014  . Abdominal pain, generalized 04/07/2014  . Coronary atherosclerosis of native coronary artery 10/12/2013  . Essential hypertension, benign   . Other and unspecified angina pectoris   . Atrial fibrillation ([place]) 04/19/2010  . ALLERGIC RHINITIS 04/19/2010  . DYSPNEA 04/19/2010  . CHEST PAIN, ATYPICAL 04/19/2010  . CANCER, COLORECTAL 04/18/2010  . Essential hypertension 04/18/2010  . G E R D 04/18/2010  . FIBROMYALGIA 04/18/2010  . INSOMNIA 04/18/2010   Past Medical History:  Diagnosis Date  . ALLERGIC RHINITIS   . Atrial fibrillation ([place])   . CANCER, COLORECTAL   . CHEST PAIN, ATYPICAL   . DYSPNEA   . Essential hypertension, benign   . FIBROMYALGIA   . G E R D   . History  of stress test    Myoview 8/16:  EF 69%, anterior and apical defect c/w breast attenuation; Low Risk   . INSOMNIA   . Other and unspecified angina pectoris    Family History  Problem Relation Age of Onset  . CAD Brother   . Heart attack Brother 45  . Bone cancer Sister   . Throat cancer Daughter    Past Surgical History:  Procedure Laterality Date  . ABDOMINAL HYSTERECTOMY    . BACK SURGERY    . BREAST SURGERY    . COLON SURGERY    . CORONARY STENT PLACEMENT    . PERCUTANEOUS CORONARY STENT INTERVENTION (PCI-S) N/A 05/19/2013   Procedure: PERCUTANEOUS CORONARY STENT INTERVENTION (PCI-S);  Surgeon: Jettie Booze, MD;  Location: [facility] CATH LAB;  Service:  Cardiovascular;  Laterality: N/A;   Social History   Occupational History  . Not on file.   Social History Main Topics  . Smoking status: Former Smoker    Packs/day: 1.00    Years: 12.00    Types: Cigarettes    Start date: 10/28/1949    Quit date: 10/28/1962  . Smokeless tobacco: Never Used  . Alcohol use No  . Drug use: No  . Sexual activity: Not on file

## 2016-09-10 NOTE — Patient Instructions (Signed)

## 2016-09-10 NOTE — Procedures (Signed)
Lumbosacral Transforaminal Epidural Steroid Injection - Infraneural Approach with Fluoroscopic Guidance  Patient: Sheryl Curtis      Date of Birth: 03/18/1932 MRN: [id number] PCP: Donnie Coffin, MD      Visit Date: 09/10/2016   Universal Protocol:    Date/Time: 11/14/171:56 PM  Consent Given By: the patient  Position: PRONE   Additional Comments: Vital signs were monitored before and after the procedure. Patient was prepped and draped in the usual sterile fashion. The correct patient, procedure, and site was verified.   Injection Procedure Details:  Procedure Site One Meds Administered:  Meds ordered this encounter  Medications  . lidocaine (PF) (XYLOCAINE) 1 % injection 0.3 mL  . methylPREDNISolone acetate (DEPO-MEDROL) injection 80 mg      Laterality: Right  Location/Site:  L4-L5  Needle size: 22 G  Needle type: Spinal  Needle Placement: Transforaminal  Findings:  -Contrast Used: 1 mL iohexol 180 mg iodine/mL   -Comments: Excellent flow of contrast along the nerve and into the epidural space.  Procedure Details: After squaring off the end-plates of the desired vertebral level to get a true AP view, the C-arm was obliqued to the painful side so that the superior articulating process is positioned about 1/3 the length of the inferior endplate.  The needle was aimed toward the junction of the superior articular process and the transverse process of the inferior vertebrae. The needle's initial entry is in the lower third of the foramen through Kambin's triangle. The soft tissues overlying this target were infiltrated with 2-3 ml. of 1% Lidocaine without Epinephrine.  The spinal needle was then inserted and advanced toward the target using a "trajectory" view along the fluoroscope beam.  Under AP and lateral visualization, the needle was advanced so it did not puncture dura and did not traverse medially beyond the 6 o'clock position of the pedicle. Bi-planar projections  were used to confirm position. Aspiration was confirmed to be negative for CSF and/or blood. A 1-2 ml. volume of Isovue-250 was injected and flow of contrast was noted at each level. Radiographs were obtained for documentation purposes.   After attaining the desired flow of contrast documented above, a 0.5 to 1.0 ml test dose of 0.25% Marcaine was injected into each respective transforaminal space.  The patient was observed for 90 seconds post injection.  After no sensory deficits were reported, and normal lower extremity motor function was noted,   the above injectate was administered so that equal amounts of the injectate were placed at each foramen (level) into the transforaminal epidural space.   Additional Comments:  The patient tolerated the procedure well Dressing: Band-Aid    Post-procedure details: Patient was observed during the procedure. Post-procedure instructions were reviewed.  Patient left the clinic in stable condition.

## 2016-09-15 ENCOUNTER — Emergency Department (HOSPITAL_COMMUNITY)
Admission: EM | Admit: 2016-09-15 | Discharge: 2016-09-15 | Disposition: A | Discharge status: Home / Self Care | Payer: PPO | Attending: Emergency Medicine | Admitting: Emergency Medicine

## 2016-09-15 ENCOUNTER — Emergency Department (HOSPITAL_COMMUNITY): Payer: PPO

## 2016-09-15 ENCOUNTER — Encounter (HOSPITAL_COMMUNITY): Payer: Self-pay

## 2016-09-15 DIAGNOSIS — J45909 Unspecified asthma, uncomplicated: Secondary | ICD-10-CM | POA: Insufficient documentation

## 2016-09-15 DIAGNOSIS — Z87891 Personal history of nicotine dependence: Secondary | ICD-10-CM | POA: Insufficient documentation

## 2016-09-15 DIAGNOSIS — I129 Hypertensive chronic kidney disease with stage 1 through stage 4 chronic kidney disease, or unspecified chronic kidney disease: Secondary | ICD-10-CM | POA: Diagnosis not present

## 2016-09-15 DIAGNOSIS — N183 Chronic kidney disease, stage 3 (moderate): Secondary | ICD-10-CM | POA: Diagnosis not present

## 2016-09-15 DIAGNOSIS — I2510 Atherosclerotic heart disease of native coronary artery without angina pectoris: Secondary | ICD-10-CM | POA: Diagnosis not present

## 2016-09-15 DIAGNOSIS — R112 Nausea with vomiting, unspecified: Secondary | ICD-10-CM

## 2016-09-15 DIAGNOSIS — Z955 Presence of coronary angioplasty implant and graft: Secondary | ICD-10-CM | POA: Insufficient documentation

## 2016-09-15 DIAGNOSIS — R197 Diarrhea, unspecified: Secondary | ICD-10-CM

## 2016-09-15 DIAGNOSIS — Z85038 Personal history of other malignant neoplasm of large intestine: Secondary | ICD-10-CM | POA: Diagnosis not present

## 2016-09-15 DIAGNOSIS — K529 Noninfective gastroenteritis and colitis, unspecified: Secondary | ICD-10-CM | POA: Diagnosis not present

## 2016-09-15 LAB — COMPREHENSIVE METABOLIC PANEL
ALT: 17 U/L (ref 14–54)
AST: 28 U/L (ref 15–41)
Albumin: 4.2 g/dL (ref 3.5–5.0)
Alkaline Phosphatase: 69 U/L (ref 38–126)
Anion gap: 15 (ref 5–15)
BUN: 25 mg/dL — ABNORMAL HIGH (ref 6–20)
CO2: 20 mmol/L — ABNORMAL LOW (ref 22–32)
Calcium: 10 mg/dL (ref 8.9–10.3)
Chloride: 97 mmol/L — ABNORMAL LOW (ref 101–111)
Creatinine, Ser: 1.46 mg/dL — ABNORMAL HIGH (ref 0.44–1.00)
GFR calc Af Amer: 37 mL/min — ABNORMAL LOW (ref >60)
GFR calc non Af Amer: 32 mL/min — ABNORMAL LOW (ref >60)
Glucose, Bld: 136 mg/dL — ABNORMAL HIGH (ref 65–99)
Potassium: 3.9 mmol/L (ref 3.5–5.1)
Sodium: 132 mmol/L — ABNORMAL LOW (ref 135–145)
Total Bilirubin: 0.5 mg/dL (ref 0.3–1.2)
Total Protein: 8 g/dL (ref 6.5–8.1)

## 2016-09-15 LAB — CBC
HCT: 47.7 % — ABNORMAL HIGH (ref 36.0–46.0)
Hemoglobin: 16.5 g/dL — ABNORMAL HIGH (ref 12.0–15.0)
MCH: 31.6 pg (ref 26.0–34.0)
MCHC: 34.6 g/dL (ref 30.0–36.0)
MCV: 91.4 fL (ref 78.0–100.0)
Platelets: 315 10*3/uL (ref 150–400)
RBC: 5.22 MIL/uL — ABNORMAL HIGH (ref 3.87–5.11)
RDW: 13.6 % (ref 11.5–15.5)
WBC: 19.4 10*3/uL — ABNORMAL HIGH (ref 4.0–10.5)

## 2016-09-15 LAB — I-STAT TROPONIN, ED: Troponin i, poc: 0.01 ng/mL (ref 0.00–0.08)

## 2016-09-15 LAB — LIPASE, BLOOD: Lipase: 64 U/L — ABNORMAL HIGH (ref 11–51)

## 2016-09-15 LAB — I-STAT CG4 LACTIC ACID, ED: Lactic Acid, Venous: 1.64 mmol/L (ref 0.5–1.9)

## 2016-09-15 MED ORDER — FENTANYL CITRATE (PF) 100 MCG/2ML IJ SOLN
25.0000 ug | Freq: Once | INTRAMUSCULAR | Status: AC
  Administered 2016-09-15: 25 ug via INTRAVENOUS

## 2016-09-15 MED ORDER — ONDANSETRON 4 MG PO TBDP
4.0000 mg | ORAL_TABLET | Freq: Once | ORAL | Status: AC | PRN
  Administered 2016-09-15: 4 mg via ORAL

## 2016-09-15 MED ORDER — SODIUM CHLORIDE 0.9 % IV BOLUS (SEPSIS)
1000.0000 mL | Freq: Once | INTRAVENOUS | Status: AC
  Administered 2016-09-15: 1000 mL via INTRAVENOUS

## 2016-09-15 MED ORDER — FENTANYL CITRATE (PF) 100 MCG/2ML IJ SOLN
50.0000 ug | INTRAMUSCULAR | Status: DC | PRN
  Administered 2016-09-15: 50 ug via INTRAVENOUS
  Filled 2016-09-15 (×2): qty 2

## 2016-09-15 MED ORDER — ONDANSETRON 4 MG PO TBDP
ORAL_TABLET | ORAL | Status: AC
  Filled 2016-09-15: qty 1

## 2016-09-15 MED ORDER — IOPAMIDOL (ISOVUE-300) INJECTION 61%
INTRAVENOUS | Status: AC
  Filled 2016-09-15: qty 30

## 2016-09-15 MED ORDER — ACETAMINOPHEN 325 MG PO TABS: 650.0000 mg | ORAL_TABLET | Freq: Four times a day (QID) | ORAL | 0 refills | Status: DC | PRN

## 2016-09-15 MED ORDER — ONDANSETRON HCL 4 MG PO TABS: 4.0000 mg | ORAL_TABLET | Freq: Three times a day (TID) | ORAL | 0 refills | Status: DC | PRN

## 2016-09-15 NOTE — Progress Notes (Deleted)
Cardiology Office Note   Date:  09/15/2016   ID:  Sheryl Curtis, DOB 12-29-1931, MRN [id number]  PCP:  Donnie Coffin, MD    No chief complaint on file.  weakness  Wt Readings from Last 3 Encounters:  09/15/16 88.5 kg (195 lb)  08/26/16 88.5 kg (195 lb)  08/07/16 88.5 kg (195 lb)       History of Present Illness: Sheryl Curtis is a 82 y.o. female  Who has had CAD and an LAD stent in 2014.  She has not taken any NTG recently.  Monitor was negative.    Has not felt well.  She was hospitalized in 10/17 for HTN urgency.   Yesterday, she was seen in the ER for vomiting and diarrhea.      Past Medical History:  Diagnosis Date  . ALLERGIC RHINITIS   . Atrial fibrillation ([place])   . CANCER, COLORECTAL   . CHEST PAIN, ATYPICAL   . DYSPNEA   . Essential hypertension, benign   . FIBROMYALGIA   . G E R D   . History of stress test    Myoview 8/16:  EF 69%, anterior and apical defect c/w breast attenuation; Low Risk   . INSOMNIA   . Other and unspecified angina pectoris     Past Surgical History:  Procedure Laterality Date  . ABDOMINAL HYSTERECTOMY    . BACK SURGERY    . BREAST SURGERY    . COLON SURGERY    . CORONARY STENT PLACEMENT    . PERCUTANEOUS CORONARY STENT INTERVENTION (PCI-S) N/A 05/19/2013   Procedure: PERCUTANEOUS CORONARY STENT INTERVENTION (PCI-S);  Surgeon: Jettie Booze, MD;  Location: [facility] CATH LAB;  Service: Cardiovascular;  Laterality: N/A;     No current facility-administered medications for this visit.    Current Outpatient Prescriptions  Medication Sig Dispense Refill  . albuterol (PROVENTIL) (2.5 MG/3ML) 0.083% nebulizer solution 1 vial in neb every 6 hours and as needed Dx J45.909 (Patient taking differently: Take 2.5 mg by nebulization every 4 (four) hours as needed for wheezing or shortness of breath. 1 vial in neb every 6 hours and as needed Dx J45.909) 75 mL 12  . Albuterol Sulfate (PROAIR RESPICLICK) 123XX123 (90 BASE) MCG/ACT AEPB  Inhale 1-2 puffs into the lungs as needed (for shortness of breath and/or wheezing.). 1 each 1  . amLODipine (NORVASC) 10 MG tablet Take 1 tablet (10 mg total) by mouth at bedtime. 30 tablet 0  . Ascorbic Acid (VITAMIN C) 1000 MG tablet Take 1,000 mg by mouth daily.    . budesonide-formoterol (SYMBICORT) 160-4.5 MCG/ACT inhaler Inhale 2 puffs into the lungs 2 (two) times daily. 1 Inhaler 0  . CALCIUM PO Take 1,200 mg by mouth daily.     . clopidogrel (PLAVIX) 75 MG tablet TAKE 1 TABLET(75 MG) BY MOUTH EVERY OTHER DAY 45 tablet 0  . diclofenac sodium (VOLTAREN) 1 % GEL APP THIN LAYER EXTERNALLY TO AFFECT AREA(S) UP TO FOUR TIMES DAILY AS NEEDED FOR PAIN  3  . DULoxetine (CYMBALTA) 60 MG capsule Take 60 mg by mouth daily.  0  . isosorbide mononitrate (IMDUR) 30 MG 24 hr tablet TAKE 1 TABLET(30 MG) BY MOUTH DAILY 30 tablet 8  . meloxicam (MOBIC) 7.5 MG tablet Take 7.5 mg by mouth 2 (two) times daily as needed for pain (and inflammation).     . Nebulizers (COMPRESSOR NEBULIZER) MISC 1 Device by Does not apply route once. 1 each 0  . nitroGLYCERIN (NITROSTAT)  0.4 MG SL tablet Place 1 tablet (0.4 mg total) under the tongue every 5 (five) minutes as needed for chest pain. 90 tablet 3  . Oxycodone HCl 10 MG TABS TAKE 10 MG BY MOUTH EVERY 6 HOURS  0  . pantoprazole (PROTONIX) 40 MG tablet Take 40 mg by mouth daily.     Marland Kitchen tiZANidine (ZANAFLEX) 4 MG tablet TAKE 1 TABLETBY MOUTH EVER 8 HOURS  AS NEEDED FOR SPASMS NOT TO EXCEED  3 DOSES IN 24 HOURS  1  . valsartan-hydrochlorothiazide (DIOVAN-HCT) 160-25 MG tablet Take 1 tablet by mouth daily.     Facility-Administered Medications Ordered in Other Visits  Medication Dose Route Frequency Provider Last Rate Last Dose  . fentaNYL (SUBLIMAZE) injection 50 mcg  50 mcg Intravenous Q2H PRN Carlyle Dolly, MD      . ondansetron (ZOFRAN-ODT) 4 MG disintegrating tablet           . sodium chloride 0.9 % bolus 1,000 mL  1,000 mL Intravenous Once Carlyle Dolly,  MD        Allergies:   Aspirin; Hydroxyquinolines; Imipramine hcl; Pamelor [nortriptyline hcl]; Prednisone; Procardia [nifedipine]; Tramadol; Ace inhibitors; and Plaquenil [hydroxychloroquine sulfate]    Social History:  The patient  reports that she quit smoking about 53 years ago. Her smoking use included Cigarettes. She started smoking about 66 years ago. She has a 12.00 pack-year smoking history. She has never used smokeless tobacco. She reports that she does not drink alcohol or use drugs.   Family History:  The patient's family history includes Bone cancer in her sister; CAD in her brother; Heart attack (age of onset: 73) in her brother; Throat cancer in her daughter.    ROS:  Please see the history of present illness.   Otherwise, review of systems are positive for weakness, diarhea.   All other systems are reviewed and negative.    PHYSICAL EXAM: VS:  There were no vitals taken for this visit. , BMI There is no height or weight on file to calculate BMI. GEN: Well nourished, well developed, in no acute distress  HEENT: normal  Neck: no JVD, carotid bruits, or masses Cardiac: RRR; no murmurs, rubs, or gallops,no edema  Respiratory:  clear to auscultation bilaterally, normal work of breathing GI: soft, nontender, nondistended, + BS MS: no deformity or atrophy  Skin: warm and dry, no rash Neuro:  Strength and sensation are intact Psych: euthymic mood, full affect   Recent Labs: 09/15/2016: ALT 17; BUN 25; Creatinine, Ser 1.46; Hemoglobin 16.5; Platelets 315; Potassium 3.9; Sodium 132   Lipid Panel No results found for: CHOL, TRIG, HDL, CHOLHDL, VLDL, LDLCALC, LDLDIRECT   Other studies Reviewed: Additional studies/ records that were reviewed today with results demonstrating: prior cath records.   ASSESSMENT AND PLAN:  1. CAD: s/p LAD stent a few years ago in 2014.  No clear angina.  We discussed repeat cath but at this point given the [facility] and weakness, she is not  interested.  THis is reasonable.  If sx get worse, she will let us know.   2. SHOB: EF has been normal with normal BNP.  Likely multifactorial.  3. Encouraged her to drink some gatorade to replace electrolytes while she has diarrhea.     Current medicines are reviewed at length with the patient today.  The patient concerns regarding her medicines were addressed.  The following changes have been made:  No change  Labs/ tests ordered today include:  No orders of  the defined types were placed in this encounter.   Recommend 150 minutes/week of aerobic exercise Low fat, low carb, high fiber diet recommended  Disposition:   FU in 6 months   Signed, Larae Grooms, MD  09/15/2016 6:43 PM    [facility] [address], [place], [place]  [address] Phone: [phone]; Fax: [phone]

## 2016-09-15 NOTE — Discharge Instructions (Signed)
You were seen at the hospital today for nausea, vomiting, and abdominal pain. Your symptoms are likely from a virus. Your CT scan was normal. You can take Zofran as prescribed for nausea. You can take Tylenol as prescribed for abdominal pain. Try to drink as much water as you can to stay hydrated. Try eating bland foods. If you do not start to feel better within the next 3 days, if your develop worse abdominal pain, or you have blood in your stool, please see a doctor. Take care!

## 2016-09-15 NOTE — ED Notes (Signed)
Patient transported to CT 

## 2016-09-15 NOTE — ED Triage Notes (Signed)
Onset 09-12-16 upper abd/epigastric intermittant pain and diarrhea, loose, x 10 day.  Worse when moving, better when laying still.  Vomiting started today x 4.  Pt taking pepto bismal with no relief.  Decreased eating and drinking.  Urinating WNL.

## 2016-09-15 NOTE — ED Provider Notes (Signed)
[facility] DEPT Provider Note   CSN: [id number] Arrival date & time: 09/15/16  1515   History   Chief Complaint Chief Complaint  Patient presents with  . Abdominal Pain  . Emesis  . Diarrhea   HPI   Sheryl Curtis is a 82 y.o. female with PMH of a fib, colorectal cancer (apparently removed in 2009), HTN who presents with abdominal pain, emesis, and diarrhea. Patient states that she has had abdominal pain and diarrhea since Thursday. Today around noon she developed non bloody non bilious emesis. She has had about 5-6 episodes of diarrhea and  and 4 episodes of emesis in the last 24 hours. Diarrhea is loose and watery, no hematochezia. She has tried pepto bismol which turned her stools dark. She describes her abdominal pain as "all over" and cannot point to 1 particular place. She also feels overall weak. She has not had anything to eat or drink today and hasn't tolerated much PO over the last couple days. She lives with her daughter who has been helping her. Denies any fevers, chills, chest pain, shortness of breath, cough, numbness, tingling.    Past Medical History:  Diagnosis Date  . ALLERGIC RHINITIS   . Atrial fibrillation ([place])   . CANCER, COLORECTAL   . CHEST PAIN, ATYPICAL   . DYSPNEA   . Essential hypertension, benign   . FIBROMYALGIA   . G E R D   . History of stress test    Myoview 8/16:  EF 69%, anterior and apical defect c/w breast attenuation; Low Risk   . INSOMNIA   . Other and unspecified angina pectoris     Patient Active Problem List   Diagnosis Date Noted  . Sinus bradycardia 08/05/2016  . Hypertensive urgency 08/03/2016  . Asthma 02/19/2016  . Chronic kidney disease (CKD), stage III (moderate) 07/05/2015  . CAFL (chronic airflow limitation) ([place]) 07/05/2015  . Generalized OA 07/05/2015  . 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 H/O malignant neoplasm of colon 07/05/2015  . Elevated troponin 07/02/2015  . UTI (lower urinary tract infection)  07/01/2015  . Fibromyalgia syndrome 07/01/2015  . Hyponatremia 07/01/2015  . Urinary tract infectious disease   . Pulmonary nodules/lesions, multiple 04/11/2014  . Abdominal pain, generalized 04/07/2014  . Coronary atherosclerosis of native coronary artery 10/12/2013  . Essential hypertension, benign   . Other and unspecified angina pectoris   . Atrial fibrillation ([place]) 04/19/2010  . ALLERGIC RHINITIS 04/19/2010  . DYSPNEA 04/19/2010  . CHEST PAIN, ATYPICAL 04/19/2010  . CANCER, COLORECTAL 04/18/2010  . Essential hypertension 04/18/2010  . G E R D 04/18/2010  . FIBROMYALGIA 04/18/2010  . INSOMNIA 04/18/2010    Past Surgical History:  Procedure Laterality Date  . ABDOMINAL HYSTERECTOMY    . BACK SURGERY    . BREAST SURGERY    . COLON SURGERY    . CORONARY STENT PLACEMENT    . PERCUTANEOUS CORONARY STENT INTERVENTION (PCI-S) N/A 05/19/2013   Procedure: PERCUTANEOUS CORONARY STENT INTERVENTION (PCI-S);  Surgeon: Jettie Booze, MD;  Location: [facility] CATH LAB;  Service: Cardiovascular;  Laterality: N/A;    OB History    No data available       Home Medications    Prior to Admission medications   Medication Sig Start Date End Date Taking? Authorizing Provider  acetaminophen (TYLENOL) 325 MG tablet Take 2 tablets (650 mg total) by mouth every 6 (six) hours as needed for moderate pain or fever. 09/15/16   Carlyle Dolly, MD  albuterol (  PROVENTIL) (2.5 MG/3ML) 0.083% nebulizer solution 1 vial in neb every 6 hours and as needed Dx J45.909 Patient taking differently: Take 2.5 mg by nebulization every 4 (four) hours as needed for wheezing or shortness of breath. 1 vial in neb every 6 hours and as needed Dx J45.909 04/05/16   Deneise Lever, MD  Albuterol Sulfate (PROAIR RESPICLICK) 123XX123 (90 BASE) MCG/ACT AEPB Inhale 1-2 puffs into the lungs as needed (for shortness of breath and/or wheezing.). 01/10/15   Rigoberto Noel, MD  amLODipine (NORVASC) 10 MG tablet Take 1 tablet (10 mg  total) by mouth at bedtime. 08/05/16   Patrecia Pour, MD  Ascorbic Acid (VITAMIN C) 1000 MG tablet Take 1,000 mg by mouth daily.    Historical Provider, MD  budesonide-formoterol (SYMBICORT) 160-4.5 MCG/ACT inhaler Inhale 2 puffs into the lungs 2 (two) times daily. 06/17/16 08/03/16  Rigoberto Noel, MD  CALCIUM PO Take 1,200 mg by mouth daily.     Historical Provider, MD  clopidogrel (PLAVIX) 75 MG tablet TAKE 1 TABLET(75 MG) BY MOUTH EVERY OTHER DAY 07/02/16   Jettie Booze, MD  diclofenac sodium (VOLTAREN) 1 % GEL APP THIN LAYER EXTERNALLY TO AFFECT AREA(S) UP TO FOUR TIMES DAILY AS NEEDED FOR PAIN 07/03/16   Historical Provider, MD  DULoxetine (CYMBALTA) 60 MG capsule Take 60 mg by mouth daily. 05/16/15   Historical Provider, MD  isosorbide mononitrate (IMDUR) 30 MG 24 hr tablet TAKE 1 TABLET(30 MG) BY MOUTH DAILY 07/29/16   Liliane Shi, PA-C  meloxicam (MOBIC) 7.5 MG tablet Take 7.5 mg by mouth 2 (two) times daily as needed for pain (and inflammation).  07/30/16   Historical Provider, MD  Nebulizers (COMPRESSOR NEBULIZER) MISC 1 Device by Does not apply route once. 04/04/16   Deneise Lever, MD  nitroGLYCERIN (NITROSTAT) 0.4 MG SL tablet Place 1 tablet (0.4 mg total) under the tongue every 5 (five) minutes as needed for chest pain. 07/07/15   Jettie Booze, MD  ondansetron (ZOFRAN) 4 MG tablet Take 1 tablet (4 mg total) by mouth every 8 (eight) hours as needed for nausea or vomiting. 09/15/16   Carlyle Dolly, MD  Oxycodone HCl 10 MG TABS TAKE 10 MG BY MOUTH EVERY 6 HOURS 07/09/16   Historical Provider, MD  pantoprazole (PROTONIX) 40 MG tablet Take 40 mg by mouth daily.  07/28/16   Historical Provider, MD  tiZANidine (ZANAFLEX) 4 MG tablet TAKE 1 TABLETBY MOUTH EVER 8 HOURS  AS NEEDED FOR SPASMS NOT TO EXCEED  3 DOSES IN 24 HOURS 07/09/16   Historical Provider, MD  valsartan-hydrochlorothiazide (DIOVAN-HCT) 160-25 MG tablet Take 1 tablet by mouth daily.    Historical Provider, MD    Family  History Family History  Problem Relation Age of Onset  . CAD Brother   . Heart attack Brother 74  . Bone cancer Sister   . Throat cancer Daughter     Social History Social History  Substance Use Topics  . Smoking status: Former Smoker    Packs/day: 1.00    Years: 12.00    Types: Cigarettes    Start date: 10/28/1949    Quit date: 10/28/1962  . Smokeless tobacco: Never Used  . Alcohol use No     Allergies   Aspirin; Hydroxyquinolines; Imipramine hcl; Pamelor [nortriptyline hcl]; Prednisone; Procardia [nifedipine]; Tramadol; Ace inhibitors; and Plaquenil [hydroxychloroquine sulfate]   Review of Systems Review of Systems  10 Systems reviewed and are negative for acute change except as  noted in the HPI.   Physical Exam Updated Vital Signs BP 180/80   Pulse 98   Temp 97.6 F (36.4 C) (Oral)   Resp 20   Ht 5' 4.5" (1.638 m)   Wt 88.5 kg   SpO2 96%   BMI 32.95 kg/m   Physical Exam  Constitutional: She is oriented to person, place, and time. She appears well-developed and well-nourished.  HENT:  Head: Normocephalic.  Right Ear: External ear normal.  Left Ear: External ear normal.  Nose: Nose normal.  Dry mucous membranes  Eyes: Conjunctivae and EOM are normal. Pupils are equal, round, and reactive to light.  Neck: Normal range of motion. Neck supple.  Cardiovascular: Normal rate, regular rhythm and intact distal pulses.   Pulmonary/Chest: Effort normal and breath sounds normal. No respiratory distress. She has no wheezes.  Abdominal: Soft. Bowel sounds are normal. There is generalized tenderness. There is negative Murphy's sign.  Musculoskeletal: Normal range of motion. She exhibits no edema.  Neurological: She is alert and oriented to person, place, and time. No sensory deficit. She exhibits normal muscle tone.  Skin: Skin is warm and dry. Capillary refill takes less than 2 seconds. No rash noted.  Psychiatric: She has a normal mood and affect. Her behavior is normal.  Judgment and thought content normal.     ED Treatments / Results  Labs (all labs ordered are listed, but only abnormal results are displayed) Labs Reviewed  LIPASE, BLOOD - Abnormal; Notable for the following:       Result Value   Lipase 64 (*)    All other components within normal limits  COMPREHENSIVE METABOLIC PANEL - Abnormal; Notable for the following:    Sodium 132 (*)    Chloride 97 (*)    CO2 20 (*)    Glucose, Bld 136 (*)    BUN 25 (*)    Creatinine, Ser 1.46 (*)    GFR calc non Af Amer 32 (*)    GFR calc Af Amer 37 (*)    All other components within normal limits  CBC - Abnormal; Notable for the following:    WBC 19.4 (*)    RBC 5.22 (*)    Hemoglobin 16.5 (*)    HCT 47.7 (*)    All other components within normal limits  OCCULT BLOOD X 1 CARD TO LAB, STOOL  I-STAT TROPOININ, ED  I-STAT CG4 LACTIC ACID, ED    EKG  EKG Interpretation  Date/Time:  Sunday September 15 2016 15:58:54 EST Ventricular Rate:  85 PR Interval:  138 QRS Duration: 78 QT Interval:  388 QTC Calculation: 461 R Axis:   65 Text Interpretation:  Normal sinus rhythm Normal ECG Confirmed by ZAVITZ MD, JOSHUA [phone]) on 09/15/2016 6:31:23 PM       Radiology Ct Abdomen Pelvis Wo Contrast  Result Date: 09/15/2016 CLINICAL DATA:  Acute onset of epigastric abdominal pain and vomiting. Diarrhea. Initial encounter. EXAM: CT ABDOMEN AND PELVIS WITHOUT CONTRAST TECHNIQUE: Multidetector CT imaging of the abdomen and pelvis was performed following the standard protocol without IV contrast. COMPARISON:  CT of the abdomen and pelvis performed 04/02/2014, and MRI of the lumbar spine performed 08/21/2016 FINDINGS: Lower chest: The visualized lung bases are clear. Mild calcification is noted at the mitral valve. Hepatobiliary: The liver is unremarkable in appearance. The gallbladder is unremarkable in appearance. The common bile duct remains normal in caliber. Pancreas: The pancreas is within normal limits.  Spleen: The spleen is unremarkable in appearance. Adrenals/Urinary Tract:  The adrenal glands are unremarkable in appearance. There is no evidence of hydronephrosis. No renal or ureteral stones are identified. Mild nonspecific perinephric stranding is noted bilaterally. A small right renal cyst is noted. Stomach/Bowel: The stomach is unremarkable in appearance. The small bowel is within normal limits. The appendix is normal in caliber, without evidence of appendicitis. Minimal diverticulosis is noted along the sigmoid colon, without evidence of diverticulitis. Vascular/Lymphatic: Diffuse calcification is seen along the abdominal aorta and its branches. Mild luminal narrowing is noted along the proximal left common iliac artery. The abdominal aorta is otherwise grossly unremarkable. The inferior vena cava is grossly unremarkable. No retroperitoneal lymphadenopathy is seen. No pelvic sidewall lymphadenopathy is identified. Reproductive: The bladder is mildly distended and grossly unremarkable. The patient is status post hysterectomy. No suspicious adnexal masses are seen. Other: No additional soft tissue abnormalities are seen. Musculoskeletal: No acute osseous abnormalities are identified. Multilevel vacuum phenomenon is noted along the lower thoracic and lumbar spine, with mild endplate sclerotic change and underlying facet disease. The visualized musculature is unremarkable in appearance. IMPRESSION: 1. No acute abnormality seen to explain the patient's symptoms. 2. Small right renal cyst noted. 3. Minimal diverticulosis along the sigmoid colon, without evidence of diverticulitis. 4. Diffuse aortic atherosclerosis, with mild luminal narrowing along the proximal left common iliac artery. 5. Mild degenerative change along the lower thoracic and lumbar spine. Electronically Signed   By: Garald Balding M.D.   On: 09/15/2016 20:51    Procedures Procedures (including critical care time)  Medications Ordered in  ED Medications  ondansetron (ZOFRAN-ODT) 4 MG disintegrating tablet (not administered)  fentaNYL (SUBLIMAZE) injection 50 mcg (50 mcg Intravenous Given 09/15/16 2000)  iopamidol (ISOVUE-300) 61 % injection (not administered)  ondansetron (ZOFRAN-ODT) disintegrating tablet 4 mg (4 mg Oral Given 09/15/16 1607)  sodium chloride 0.9 % bolus 1,000 mL (1,000 mLs Intravenous New Bag/Given 09/15/16 1857)  fentaNYL (SUBLIMAZE) injection 25 mcg (25 mcg Intravenous Given 09/15/16 2148)     Initial Impression / Assessment and Plan / ED Course  I have reviewed the triage vital signs and the nursing notes.  Pertinent labs & imaging results that were available during my care of the patient were reviewed by me and considered in my medical decision making (see chart for details).  Clinical Course    Sheryl Curtis is a 82 y.o. female with PMH of a fib, colorectal cancer, HTN who presents with abdominal pain, emesis, and diarrhea.   Vital signs showing normal heart rate of 86 with increased BP of 171/91. Patient afebrile with normal respiratory rate without hypoxia. EKG showing NSR, QTc 461. troponin negative. CBC showing leukocytosis of 19.4. Hgb elevated to 16.5. BMP showing elecated Cr to 1.46. Lipase mildly elevated at 64. Lactic acid normal at 1.64.   Physical exam showing diffuse abdominal pain with dry mucous membranes.   Zofran given for nausea, fentanyl given for abdominal pain. 1 L NS bolus given.   CT abdomen showing no acute abnormalities. No concerns for appendicitis, cholecystitis, or diverticulitis.   Patient was tolerating clear liquids and requesting water. Abdominal pain improved with fentanyl. Patient given rx for Zofran and Tylenol. Return precautions discussed. Symptoms likely viral gastroenteritis. Patient stable for discharge.   Final Clinical Impressions(s) / ED Diagnoses   Final diagnoses:  Nausea vomiting and diarrhea  Gastroenteritis    New Prescriptions New  Prescriptions   ACETAMINOPHEN (TYLENOL) 325 MG TABLET    Take 2 tablets (650 mg total) by mouth every 6 (six)  hours as needed for moderate pain or fever.   ONDANSETRON (ZOFRAN) 4 MG TABLET    Take 1 tablet (4 mg total) by mouth every 8 (eight) hours as needed for nausea or vomiting.     Carlyle Dolly, MD 09/15/16 [id number]    Elnora Morrison, MD 09/16/16 [phone]

## 2016-09-15 NOTE — ED Notes (Signed)
Pt informed of need for urine sample.

## 2016-09-15 NOTE — ED Notes (Signed)
Pt unable to provide urine sample at triage.  Pt had just gone to bathroom prior to triage.

## 2016-09-16 ENCOUNTER — Ambulatory Visit: Payer: PPO | Admitting: Interventional Cardiology

## 2016-09-23 ENCOUNTER — Ambulatory Visit (INDEPENDENT_AMBULATORY_CARE_PROVIDER_SITE_OTHER): Payer: PPO | Admitting: Orthopaedic Surgery

## 2016-09-24 ENCOUNTER — Encounter: Payer: Self-pay | Admitting: Interventional Cardiology

## 2016-09-25 ENCOUNTER — Encounter (INDEPENDENT_AMBULATORY_CARE_PROVIDER_SITE_OTHER): Payer: Self-pay | Admitting: Orthopaedic Surgery

## 2016-09-25 ENCOUNTER — Ambulatory Visit (INDEPENDENT_AMBULATORY_CARE_PROVIDER_SITE_OTHER): Payer: PPO | Admitting: Physician Assistant

## 2016-09-25 VITALS — Ht 64.5 in | Wt 195.0 lb

## 2016-09-25 DIAGNOSIS — G8929 Other chronic pain: Secondary | ICD-10-CM

## 2016-09-25 DIAGNOSIS — M545 Low back pain: Secondary | ICD-10-CM

## 2016-09-25 NOTE — Progress Notes (Signed)
Office Visit Note   Patient: Sheryl Curtis           Date of Birth: [birth year]           MRN: [id number] Visit Date: 09/25/2016              Requested by: L.Donnie Coffin, MD [place] [employer] [place], [place] [address] PCP: Donnie Coffin, MD   Assessment & Plan: Visit Diagnoses:  1. Chronic low back pain, unspecified back pain laterality, with sciatica presence unspecified     Plan: Back exercises given and reviewed.   Follow-Up Instructions: Return if symptoms worsen or fail to improve.   Orders:  No orders of the defined types were placed in this encounter.  No orders of the defined types were placed in this encounter.     Procedures: No procedures performed   Clinical Data: No additional findings.   Subjective: Chief Complaint  Patient presents with  . Lower Back - Follow-up    S/p epidural injection 09/10/16 with Dr. Ernestina Patches. She is doing much better, she states her radicular pain has since than resolved. She questions how often she can get these injections.    HPI This Shanker returns today status post transforaminal  L4-L5 injection by Dr. Ernestina Patches on 09/10/2016. She states she is no longer having radicular symptoms. Has some slight back pain but something she can live with. Review of Systems   Objective: Vital Signs: Ht 5' 4.5" (1.638 m)   Wt 195 lb (88.5 kg)   BMI 32.95 kg/m   Physical Exam  Constitutional: She is oriented to person, place, and time. She appears well-developed and well-nourished. No distress.  Neurological: She is alert and oriented to person, place, and time.    Ortho Exam Negative Straight leg raise bilaterally Specialty Comments:  No specialty comments available.  Imaging: No results found.   PMFS History: Patient Active Problem List   Diagnosis Date Noted  . Sinus bradycardia 08/05/2016  . Hypertensive urgency 08/03/2016  . Asthma 02/19/2016  . Chronic kidney disease (CKD), stage III (moderate) 07/05/2015    . CAFL (chronic airflow limitation) ([place]) 07/05/2015  . Generalized OA 07/05/2015  . 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 H/O malignant neoplasm of colon 07/05/2015  . Elevated troponin 07/02/2015  . UTI (lower urinary tract infection) 07/01/2015  . Fibromyalgia syndrome 07/01/2015  . Hyponatremia 07/01/2015  . Urinary tract infectious disease   . Pulmonary nodules/lesions, multiple 04/11/2014  . Abdominal pain, generalized 04/07/2014  . Coronary atherosclerosis of native coronary artery 10/12/2013  . Essential hypertension, benign   . Other and unspecified angina pectoris   . Atrial fibrillation ([place]) 04/19/2010  . ALLERGIC RHINITIS 04/19/2010  . DYSPNEA 04/19/2010  . CHEST PAIN, ATYPICAL 04/19/2010  . CANCER, COLORECTAL 04/18/2010  . Essential hypertension 04/18/2010  . G E R D 04/18/2010  . FIBROMYALGIA 04/18/2010  . INSOMNIA 04/18/2010   Past Medical History:  Diagnosis Date  . ALLERGIC RHINITIS   . Atrial fibrillation ([place])   . CANCER, COLORECTAL   . CHEST PAIN, ATYPICAL   . DYSPNEA   . Essential hypertension, benign   . FIBROMYALGIA   . G E R D   . History of stress test    Myoview 8/16:  EF 69%, anterior and apical defect c/w breast attenuation; Low Risk   . INSOMNIA   . Other and unspecified angina pectoris     Family History  Problem Relation Age of Onset  . CAD Brother   .  Heart attack Brother 27  . Bone cancer Sister   . Throat cancer Daughter     Past Surgical History:  Procedure Laterality Date  . ABDOMINAL HYSTERECTOMY    . BACK SURGERY    . BREAST SURGERY    . COLON SURGERY    . CORONARY STENT PLACEMENT    . PERCUTANEOUS CORONARY STENT INTERVENTION (PCI-S) N/A 05/19/2013   Procedure: PERCUTANEOUS CORONARY STENT INTERVENTION (PCI-S);  Surgeon: Jettie Booze, MD;  Location: [facility] CATH LAB;  Service: Cardiovascular;  Laterality: N/A;   Social History   Occupational History  . Not on file.   Social History Main Topics  . Smoking status:  Former Smoker    Packs/day: 1.00    Years: 12.00    Types: Cigarettes    Start date: 10/28/1949    Quit date: 10/28/1962  . Smokeless tobacco: Never Used  . Alcohol use No  . Drug use: No  . Sexual activity: Not on file

## 2016-09-30 NOTE — Progress Notes (Deleted)
Cardiology Office Note   Date:  09/30/2016   ID:  Sheryl Curtis, DOB 15-Apr-1932, MRN [id number]  PCP:  Donnie Coffin, MD    No chief complaint on file.  weakness  Wt Readings from Last 3 Encounters:  09/25/16 195 lb (88.5 kg)  09/15/16 195 lb (88.5 kg)  08/26/16 195 lb (88.5 kg)       History of Present Illness: Sheryl Curtis is a 82 y.o. female  Who has had CAD and an LAD stent in 2014.  She has not taken any NTG recently.  Monitor was negative.    THe chest pain that she had at her last visit has resolved.  TH epalpitations have resolved.  SH eremains SHOB with exerton.  SHe does not do much in the way of exercise.    She has had soe diarrhea over the past few days,  SHe has not been drinking much water of late.      Past Medical History:  Diagnosis Date  . ALLERGIC RHINITIS   . Atrial fibrillation ([place])   . CANCER, COLORECTAL   . CHEST PAIN, ATYPICAL   . DYSPNEA   . Essential hypertension, benign   . FIBROMYALGIA   . G E R D   . History of stress test    Myoview 8/16:  EF 69%, anterior and apical defect c/w breast attenuation; Low Risk   . INSOMNIA   . Other and unspecified angina pectoris     Past Surgical History:  Procedure Laterality Date  . ABDOMINAL HYSTERECTOMY    . BACK SURGERY    . BREAST SURGERY    . COLON SURGERY    . CORONARY STENT PLACEMENT    . PERCUTANEOUS CORONARY STENT INTERVENTION (PCI-S) N/A 05/19/2013   Procedure: PERCUTANEOUS CORONARY STENT INTERVENTION (PCI-S);  Surgeon: Jettie Booze, MD;  Location: [facility] CATH LAB;  Service: Cardiovascular;  Laterality: N/A;     Current Outpatient Prescriptions  Medication Sig Dispense Refill  . acetaminophen (TYLENOL) 325 MG tablet Take 2 tablets (650 mg total) by mouth every 6 (six) hours as needed for moderate pain or fever. 30 tablet 0  . albuterol (PROVENTIL) (2.5 MG/3ML) 0.083% nebulizer solution 1 vial in neb every 6 hours and as needed Dx J45.909 (Patient taking differently: Take 2.5  mg by nebulization every 4 (four) hours as needed for wheezing or shortness of breath. 1 vial in neb every 6 hours and as needed Dx J45.909) 75 mL 12  . Albuterol Sulfate (PROAIR RESPICLICK) 123XX123 (90 BASE) MCG/ACT AEPB Inhale 1-2 puffs into the lungs as needed (for shortness of breath and/or wheezing.). 1 each 1  . ALPRAZolam (XANAX) 0.25 MG tablet     . amLODipine (NORVASC) 10 MG tablet Take 1 tablet (10 mg total) by mouth at bedtime. 30 tablet 0  . Ascorbic Acid (VITAMIN C) 1000 MG tablet Take 1,000 mg by mouth daily.    . budesonide-formoterol (SYMBICORT) 160-4.5 MCG/ACT inhaler Inhale 2 puffs into the lungs 2 (two) times daily. 1 Inhaler 0  . CALCIUM PO Take 1,200 mg by mouth daily.     . clopidogrel (PLAVIX) 75 MG tablet TAKE 1 TABLET(75 MG) BY MOUTH EVERY OTHER DAY 45 tablet 0  . diclofenac sodium (VOLTAREN) 1 % GEL APP THIN LAYER EXTERNALLY TO AFFECT AREA(S) UP TO FOUR TIMES DAILY AS NEEDED FOR PAIN  3  . DULoxetine (CYMBALTA) 60 MG capsule Take 60 mg by mouth daily.  0  . isosorbide mononitrate (IMDUR) 30  MG 24 hr tablet TAKE 1 TABLET(30 MG) BY MOUTH DAILY 30 tablet 8  . meloxicam (MOBIC) 7.5 MG tablet Take 7.5 mg by mouth 2 (two) times daily as needed for pain (and inflammation).     . Nebulizers (COMPRESSOR NEBULIZER) MISC 1 Device by Does not apply route once. 1 each 0  . nitroGLYCERIN (NITROSTAT) 0.4 MG SL tablet Place 1 tablet (0.4 mg total) under the tongue every 5 (five) minutes as needed for chest pain. 90 tablet 3  . ondansetron (ZOFRAN) 4 MG tablet Take 1 tablet (4 mg total) by mouth every 8 (eight) hours as needed for nausea or vomiting. 20 tablet 0  . Oxycodone HCl 10 MG TABS TAKE 10 MG BY MOUTH EVERY 6 HOURS  0  . pantoprazole (PROTONIX) 40 MG tablet Take 40 mg by mouth daily.     . SYMBICORT 160-4.5 MCG/ACT inhaler     . tiZANidine (ZANAFLEX) 4 MG tablet TAKE 1 TABLETBY MOUTH EVER 8 HOURS  AS NEEDED FOR SPASMS NOT TO EXCEED  3 DOSES IN 24 HOURS  1  .  valsartan-hydrochlorothiazide (DIOVAN-HCT) 160-25 MG tablet Take 1 tablet by mouth daily.    . verapamil (CALAN-SR) 120 MG CR tablet      No current facility-administered medications for this visit.     Allergies:   Aspirin; Hydroxyquinolines; Imipramine hcl; Pamelor [nortriptyline hcl]; Prednisone; Procardia [nifedipine]; Tramadol; Ace inhibitors; and Plaquenil [hydroxychloroquine sulfate]    Social History:  The patient  reports that she quit smoking about 53 years ago. Her smoking use included Cigarettes. She started smoking about 66 years ago. She has a 12.00 pack-year smoking history. She has never used smokeless tobacco. She reports that she does not drink alcohol or use drugs.   Family History:  The patient's family history includes Bone cancer in her sister; CAD in her brother; Heart attack (age of onset: 48) in her brother; Throat cancer in her daughter.    ROS:  Please see the history of present illness.   Otherwise, review of systems are positive for weakness, diarhea.   All other systems are reviewed and negative.    PHYSICAL EXAM: VS:  There were no vitals taken for this visit. , BMI There is no height or weight on file to calculate BMI. GEN: Well nourished, well developed, in no acute distress HEENT: normal Neck: no JVD, carotid bruits, or masses Cardiac: RRR; no murmurs, rubs, or gallops,no edema  Respiratory:  clear to auscultation bilaterally, normal work of breathing GI: soft, nontender, nondistended, + BS MS: no deformity or atrophy Skin: warm and dry, no rash Neuro:  Strength and sensation are intact Psych: euthymic mood, full affect   Recent Labs: 09/15/2016: ALT 17; BUN 25; Creatinine, Ser 1.46; Hemoglobin 16.5; Platelets 315; Potassium 3.9; Sodium 132   Lipid Panel No results found for: CHOL, TRIG, HDL, CHOLHDL, VLDL, LDLCALC, LDLDIRECT   Other studies Reviewed: Additional studies/ records that were reviewed today with results demonstrating: prior cath  records.   ASSESSMENT AND PLAN:  1. CAD: s/p LAD stnet a few years ago.  No clear angina.  We discussed repeat cath but at this point given the [facility] and weakness, she is not interested.  THis is reasonable.  If sx get worse, she will let us know.   2. SHOB: EF has been normal with normal BNP.  Likely multifactorial.  3. Encouraged her to drink some gatorade to replace electrolytes while she has diarrhea.     Current medicines are reviewed  at length with the patient today.  The patient concerns regarding her medicines were addressed.  The following changes have been made:  No change  Labs/ tests ordered today include:  No orders of the defined types were placed in this encounter.   Recommend 150 minutes/week of aerobic exercise Low fat, low carb, high fiber diet recommended  Disposition:   FU in 6 months   Signed, Larae Grooms, MD  09/30/2016 10:09 PM    [facility] [place], [place], [place]  [address] Phone: [phone]; Fax: [phone]

## 2016-10-01 ENCOUNTER — Ambulatory Visit: Payer: PPO | Admitting: Interventional Cardiology

## 2016-10-18 ENCOUNTER — Ambulatory Visit: Payer: PPO | Admitting: Interventional Cardiology

## 2016-10-18 ENCOUNTER — Ambulatory Visit (INDEPENDENT_AMBULATORY_CARE_PROVIDER_SITE_OTHER): Payer: PPO | Admitting: Interventional Cardiology

## 2016-10-18 ENCOUNTER — Encounter: Payer: Self-pay | Admitting: Interventional Cardiology

## 2016-10-18 VITALS — BP 115/68 | HR 72 | Ht 64.0 in | Wt 176.8 lb

## 2016-10-18 DIAGNOSIS — I10 Essential (primary) hypertension: Secondary | ICD-10-CM

## 2016-10-18 DIAGNOSIS — R0609 Other forms of dyspnea: Secondary | ICD-10-CM

## 2016-10-18 DIAGNOSIS — I25119 Atherosclerotic heart disease of native coronary artery with unspecified angina pectoris: Secondary | ICD-10-CM | POA: Diagnosis not present

## 2016-10-18 DIAGNOSIS — R0600 Dyspnea, unspecified: Secondary | ICD-10-CM

## 2016-10-18 NOTE — Patient Instructions (Signed)
Medication Instructions:  The current medical regimen is effective;  continue present plan and medications.  Follow-Up: Follow up in 1 year with Dr. Irish Lack.  You will receive a letter in the mail 2 months before you are due.  Please call us when you receive this letter to schedule your follow up appointment.  If you need a refill on your cardiac medications before your next appointment, please call your pharmacy.  Thank you for choosing [facility]!!

## 2016-10-18 NOTE — Progress Notes (Signed)
Cardiology Office Note   Date:  10/18/2016   ID:  Sheryl Curtis, Sheryl Curtis 01-14-1932, MRN [id number]  PCP:  Donnie Coffin, MD    No chief complaint on file. CAD   Wt Readings from Last 3 Encounters:  10/18/16 176 lb 12.8 oz (80.2 kg)  09/25/16 195 lb (88.5 kg)  09/15/16 195 lb (88.5 kg)       History of Present Illness: Sheryl Curtis is a 82 y.o. female  Who has had CAD and an LAD stent in 2014.  She has not taken any NTG recently.  Monitor was negative.    THe chest pain that she had at her last visit has resolved.  The palpitations have resolved.  She remains SHOB with exerton, stable.  SHe does not do much in the way of exercise except for occasional stretching exercise. .    She has had some diarrhea in early December, and lost some weight.  No NTG use.   SHe has not been drinking much water of late.      Past Medical History:  Diagnosis Date  . ALLERGIC RHINITIS   . Atrial fibrillation ([place])   . CANCER, COLORECTAL   . CHEST PAIN, ATYPICAL   . DYSPNEA   . Essential hypertension, benign   . FIBROMYALGIA   . G E R D   . History of stress test    Myoview 8/16:  EF 69%, anterior and apical defect c/w breast attenuation; Low Risk   . INSOMNIA   . Other and unspecified angina pectoris     Past Surgical History:  Procedure Laterality Date  . ABDOMINAL HYSTERECTOMY    . BACK SURGERY    . BREAST SURGERY    . COLON SURGERY    . CORONARY STENT PLACEMENT    . PERCUTANEOUS CORONARY STENT INTERVENTION (PCI-S) N/A 05/19/2013   Procedure: PERCUTANEOUS CORONARY STENT INTERVENTION (PCI-S);  Surgeon: Jettie Booze, MD;  Location[facility] CATH LAB;  Service: Cardiovascular;  Laterality: N/A;     Current Outpatient Prescriptions  Medication Sig Dispense Refill  . acetaminophen (TYLENOL) 325 MG tablet Take 2 tablets (650 mg total) by mouth every 6 (six) hours as needed for moderate pain or fever. 30 tablet 0  . albuterol (PROVENTIL) (2.5 MG/3ML) 0.083% nebulizer solution 1  vial in neb every 6 hours and as needed Dx J45.909 (Patient taking differently: Take 2.5 mg by nebulization every 4 (four) hours as needed for wheezing or shortness of breath. 1 vial in neb every 6 hours and as needed Dx J45.909) 75 mL 12  . Albuterol Sulfate (PROAIR RESPICLICK) 123XX123 (90 BASE) MCG/ACT AEPB Inhale 1-2 puffs into the lungs as needed (for shortness of breath and/or wheezing.). 1 each 1  . ALPRAZolam (XANAX) 0.25 MG tablet Take 0.25 mg by mouth at bedtime as needed for sleep.     . Ascorbic Acid (VITAMIN C) 1000 MG tablet Take 1,000 mg by mouth daily.    Marland Kitchen CALCIUM PO Take 1,200 mg by mouth daily.     . clopidogrel (PLAVIX) 75 MG tablet TAKE 1 TABLET(75 MG) BY MOUTH EVERY OTHER DAY 45 tablet 0  . diclofenac sodium (VOLTAREN) 1 % GEL APP THIN LAYER EXTERNALLY TO AFFECT AREA(S) UP TO FOUR TIMES DAILY AS NEEDED FOR PAIN  3  . DULoxetine (CYMBALTA) 60 MG capsule Take 60 mg by mouth daily.  0  . isosorbide mononitrate (IMDUR) 30 MG 24 hr tablet TAKE 1 TABLET(30 MG) BY MOUTH DAILY 30 tablet  8  . meloxicam (MOBIC) 7.5 MG tablet Take 7.5 mg by mouth 2 (two) times daily as needed for pain (and inflammation).     . Nebulizers (COMPRESSOR NEBULIZER) MISC 1 Device by Does not apply route once. 1 each 0  . nitroGLYCERIN (NITROSTAT) 0.4 MG SL tablet Place 1 tablet (0.4 mg total) under the tongue every 5 (five) minutes as needed for chest pain. 90 tablet 3  . ondansetron (ZOFRAN) 4 MG tablet Take 1 tablet (4 mg total) by mouth every 8 (eight) hours as needed for nausea or vomiting. 20 tablet 0  . Oxycodone HCl 10 MG TABS TAKE 10 MG BY MOUTH EVERY 6 HOURS  0  . tiZANidine (ZANAFLEX) 4 MG tablet TAKE 1 TABLETBY MOUTH EVER 8 HOURS  AS NEEDED FOR SPASMS NOT TO EXCEED  3 DOSES IN 24 HOURS  1  . valsartan-hydrochlorothiazide (DIOVAN-HCT) 160-25 MG tablet Take 1 tablet by mouth daily.     No current facility-administered medications for this visit.     Allergies:   Aspirin; Hydroxyquinolines; Imipramine  hcl; Pamelor [nortriptyline hcl]; Prednisone; Procardia [nifedipine]; Tramadol; Ace inhibitors; and Plaquenil [hydroxychloroquine sulfate]    Social History:  The patient  reports that she quit smoking about 54 years ago. Her smoking use included Cigarettes. She started smoking about 67 years ago. She has a 12.00 pack-year smoking history. She has never used smokeless tobacco. She reports that she does not drink alcohol or use drugs.   Family History:  The patient's family history includes Bone cancer in her sister; CAD in her brother; Heart attack (age of onset: 7) in her brother; Throat cancer in her daughter.    ROS:  Please see the history of present illness.   Otherwise, review of systems are positive for weakness, diarrhea.   All other systems are reviewed and negative.    PHYSICAL EXAM: VS:  BP 115/68   Pulse 72   Ht 5\' 4"  (1.626 m)   Wt 176 lb 12.8 oz (80.2 kg)   SpO2 98%   BMI 30.35 kg/m  , BMI Body mass index is 30.35 kg/m. GEN: Well nourished, well developed, in no acute distress  HEENT: normal  Neck: no JVD, carotid bruits, or masses Cardiac: RRR; no murmurs, rubs, or gallops,no edema  Respiratory:  clear to auscultation bilaterally, normal work of breathing GI: soft, nontender, nondistended, + BS MS: no deformity or atrophy  Skin: warm and dry, no rash Neuro:  Strength and sensation are intact Psych: euthymic mood, full affect   Recent Labs: 09/15/2016: ALT 17; BUN 25; Creatinine, Ser 1.46; Hemoglobin 16.5; Platelets 315; Potassium 3.9; Sodium 132   Lipid Panel No results found for: CHOL, TRIG, HDL, CHOLHDL, VLDL, LDLCALC, LDLDIRECT   Other studies Reviewed: Additional studies/ records that were reviewed today with results demonstrating: prior cath records.   ASSESSMENT AND PLAN:  1. CAD: s/p LAD stent a few years ago.  No clear angina on medical therapy- sx controlled.  Some DOE that she relates to her COPD. If sx get worse, she will let us know.   2. SHOB:  EF has been normal with normal BNP.  Likely multifactorial.  Her activity is imited by COPD and joint pains. 3. Encouraged her to drink some gatorade to replace electrolytes while she has diarrhea.  Encouraged her to keep of the 20 lbs that she lost. 4. HTN: Rx written for BP cuff.     Current medicines are reviewed at length with the patient today.  The  patient concerns regarding her medicines were addressed.  The following changes have been made:  No change  Labs/ tests ordered today include:  No orders of the defined types were placed in this encounter.   Recommend 150 minutes/week of aerobic exercise Low fat, low carb, high fiber diet recommended  Disposition:   FU in 6 months   Signed, Larae Grooms, MD  10/18/2016 1:51 PM    [facility] [place], [place], [place]  [address] Phone: [phone]; Fax: [phone]

## 2016-10-26 ENCOUNTER — Other Ambulatory Visit: Payer: Self-pay | Admitting: Interventional Cardiology

## 2016-11-19 ENCOUNTER — Telehealth: Payer: Self-pay | Admitting: Interventional Cardiology

## 2016-11-19 ENCOUNTER — Other Ambulatory Visit: Payer: Self-pay | Admitting: Family Medicine

## 2016-11-19 DIAGNOSIS — R42 Dizziness and giddiness: Secondary | ICD-10-CM

## 2016-11-19 NOTE — Telephone Encounter (Signed)
Did not need this encounter °

## 2016-11-27 ENCOUNTER — Other Ambulatory Visit: Payer: PPO

## 2016-12-05 ENCOUNTER — Ambulatory Visit
Admission: RE | Admit: 2016-12-05 | Discharge: 2016-12-05 | Disposition: A | Discharge status: Home / Self Care | Payer: Self-pay | Source: Ambulatory Visit | Attending: Family Medicine | Admitting: Family Medicine

## 2016-12-05 DIAGNOSIS — R42 Dizziness and giddiness: Secondary | ICD-10-CM

## 2016-12-12 ENCOUNTER — Telehealth: Payer: Self-pay | Admitting: Pulmonary Disease

## 2016-12-12 NOTE — Telephone Encounter (Signed)
[id number] for pt.  We do not have spiriva on current med list.

## 2016-12-16 NOTE — Telephone Encounter (Signed)
lmtcb for pt.  

## 2016-12-18 NOTE — Telephone Encounter (Signed)
Called and spoke with pt and she is aware that we do not have samples of the spiriva handihaler.  She stated that she does not have to use this all the time.

## 2016-12-25 ENCOUNTER — Other Ambulatory Visit: Payer: Self-pay | Admitting: Interventional Cardiology

## 2016-12-25 NOTE — Telephone Encounter (Signed)
Please refer to the pts PCP. Thanks. 

## 2016-12-25 NOTE — Telephone Encounter (Signed)
[facility] pharmacy requesting a refill on pantoprazole 40 mg tablet. This medication is not on the pt's med list and there is no documentation where the Doctor took this pt off this medication. Please advise

## 2016-12-27 ENCOUNTER — Telehealth: Payer: Self-pay | Admitting: *Deleted

## 2016-12-27 NOTE — Telephone Encounter (Signed)
The pt states that she was not taking Pantoprazole when she came to her last OV with Dr Irish Lack but that she will take one tablet from time to time for her heartburn.  She states that she is going to try OTC heartburn medication PRN and that if it is not effective she will call us back for refill.  Pt does not need refill at this time. Thanks.

## 2016-12-27 NOTE — Telephone Encounter (Signed)
[facility] pharmacy called and requested a refill on pantoprazole. This medication is not listed on patients current med list, it was removed at last office visit with a reason of error. Per pharmacy patient has not refilled this since October. Please advise. Thanks, MI

## 2017-01-31 ENCOUNTER — Inpatient Hospital Stay: Admission: RE | Admit: 2017-01-31 | Payer: Medicare Other | Source: Ambulatory Visit

## 2017-01-31 ENCOUNTER — Telehealth: Payer: Self-pay | Admitting: Adult Health

## 2017-01-31 NOTE — Telephone Encounter (Signed)
Spoke with pt, states she does have increased sob and would like to be seen.  I advised pt that we will keep appt and can reschedule ct at this appt.  Pt apologized, stating she thought she was cancelling a ct head and not her ct chest.  appt has been left.  Nothing further needed at this time.

## 2017-01-31 NOTE — Telephone Encounter (Signed)
01/31/2017 01:25 PM Phone (Incoming) Sheryl Curtis (Self) [phone] (H)   Patient returning - she didn't know this CT was the one for RA so she didn't go - questioning how this will affect her appt with RA on 02/04/17 -pr     By Wonda Olds          Probably should reschedule since this is basically all RA follows her for ? How is she feeling, does she want to keep ov? LMTCB again

## 2017-01-31 NOTE — Telephone Encounter (Signed)
LMTCB

## 2017-02-04 ENCOUNTER — Ambulatory Visit: Payer: Medicare Other | Admitting: Pulmonary Disease

## 2017-02-24 ENCOUNTER — Ambulatory Visit: Payer: Medicare Other | Admitting: Acute Care

## 2017-03-03 ENCOUNTER — Ambulatory Visit (INDEPENDENT_AMBULATORY_CARE_PROVIDER_SITE_OTHER): Payer: Medicare Other | Admitting: Acute Care

## 2017-03-03 ENCOUNTER — Encounter: Payer: Self-pay | Admitting: Acute Care

## 2017-03-03 VITALS — BP 130/66 | HR 68 | Ht 64.5 in | Wt 200.8 lb

## 2017-03-03 DIAGNOSIS — R0602 Shortness of breath: Secondary | ICD-10-CM

## 2017-03-03 DIAGNOSIS — J4530 Mild persistent asthma, uncomplicated: Secondary | ICD-10-CM

## 2017-03-03 MED ORDER — BUDESONIDE-FORMOTEROL FUMARATE 160-4.5 MCG/ACT IN AERO: 2.0000 | INHALATION_SPRAY | Freq: Two times a day (BID) | RESPIRATORY_TRACT | 0 refills | Status: DC

## 2017-03-03 NOTE — Assessment & Plan Note (Signed)
Worsening dyspnea with exertion over the past year Plan: We will schedule you for PFT's  Please continue using your Symbicort 2 puffs twice daily. Try using your Rescue inhaler as needed up to every 6 hours for shortness of breath or wheezing. We will walk you in the office today to see if you are maintaining your oxygen levels. Please think about pulmonary rehab and let us know if you are interested. Follow up in 1 month after PFT's  O and O to determine if you are dropping your oxygen saturations at night. Please contact office for sooner follow up if symptoms do not improve or worsen or seek emergency care

## 2017-03-03 NOTE — Progress Notes (Signed)
History of Present Illness Sheryl Curtis is a 83 y.o. female remote smoker with mild persistent asthma and RA, with previous MTX use. She is followed by Dr. Elsworth Soho.    03/03/2017 6 month Follow Up: Pt. Presents for 6 month follow up. She states she has been experiencing worsening shortness of breath for the last year. She states this is worse with exertion. She states this is relieved by her rescue inhaler, but she states she is using only 1-2 times a week. She states she is not sleeping well. She states she has a lot of back pain and other issues to keep her awake. She does appear tired in the office today. She states that she is compliant with her Symbicort twice daily every day. She denies chest pain, fever, orthopnea, or hemoptysis. She states she has fatigue. Her PCP has checked hemoglobin within the last few weeks and it was normal. She is currently walking with a cane. Of note there has been weight gain over the last several months due to inactivity. We discussed pulmonary rehabilitation, however she is not interested. She would prefer to work on a stepper that she has at home. Patient did not desaturate with ambulation today in the office.   Body mass index is 33.93 kg/m.   Significant tests/ events CT abdomen 03/2014 - showed bilateral noncalcified pulmonary nodules, largest measuring 7 mm in the posterior right lower lobe. This was not noted on a prior CT in 2003. She was placed on Spiriva in 2011  chest CT 05/2014 showed scattered nodules all less than 8 mm. ACE inhibitor stopped in past for cough  CT chest 01/06/15 - Stable appearance of scattered numerous bilateral pulmonary nodules measuring up to 8 mm  Spirometry in 05/2010 showed FEV1 of 76% improvement postbronchodilator to 80%-1.60 with a ratio of 75 suggesting mild restriction. Spirometry 03/2014 FEV1 of 1.25-64%, with ratio 65 and FVC of 1.91-72%.  PFTs - 12/2014 - no airway obstruction, ratio 71, pos BD response 06/2015 Admit  for UTI and Sepsis    CT chest 01/2016 previous measured nodules unchanged from 05/2014 . There were new nodule RLL at 49mm .    CBC Latest Ref Rng & Units 09/15/2016 08/03/2016 07/06/2015  WBC 4.0 - 10.5 K/uL 19.4(H) 10.6(H) 6.9  Hemoglobin 12.0 - 15.0 g/dL 16.5(H) 12.2 9.4(L)  Hematocrit 36.0 - 46.0 % 47.7(H) 37.1 28.3(L)  Platelets 150 - 400 K/uL 315 249 171    BMP Latest Ref Rng & Units 09/15/2016 08/05/2016 08/03/2016  Glucose 65 - 99 mg/dL 136(H) 99 117(H)  BUN 6 - 20 mg/dL 25(H) 20 23(H)  Creatinine 0.44 - 1.00 mg/dL 1.46(H) 1.21(H) 1.03(H)  Sodium 135 - 145 mmol/L 132(L) 131(L) 134(L)  Potassium 3.5 - 5.1 mmol/L 3.9 4.7 4.0  Chloride 101 - 111 mmol/L 97(L) 97(L) 101  CO2 22 - 32 mmol/L 20(L) 28 25  Calcium 8.9 - 10.3 mg/dL 10.0 8.9 9.3    BNP    Component Value Date/Time   BNP 82.0 07/01/2015 1653    ProBNP    Component Value Date/Time   PROBNP 28.0 02/23/2014 1222    PFT    Component Value Date/Time   FEV1PRE 1.41 01/10/2015 0859   FEV1POST 1.64 01/10/2015 0859   FVCPRE 1.97 01/10/2015 0859   FVCPOST 2.31 01/10/2015 0859   TLC 4.87 01/10/2015 0859   DLCOUNC 19.78 01/10/2015 0859   PREFEV1FVCRT 71 01/10/2015 0859   PSTFEV1FVCRT 71 01/10/2015 0859    No results found.  Past medical hx Past Medical History:  Diagnosis Date  . ALLERGIC RHINITIS   . Atrial fibrillation ([place])   . CANCER, COLORECTAL   . CHEST PAIN, ATYPICAL   . DYSPNEA   . Essential hypertension, benign   . FIBROMYALGIA   . G E R D   . History of stress test    Myoview 8/16:  EF 69%, anterior and apical defect c/w breast attenuation; Low Risk   . INSOMNIA   . Other and unspecified angina pectoris      Social History  Substance Use Topics  . Smoking status: Former Smoker    Packs/day: 1.00    Years: 12.00    Types: Cigarettes    Start date: 10/28/1949    Quit date: 10/28/1962  . Smokeless tobacco: Never Used  . Alcohol use No    Tobacco Cessation: Counseling given: Not  Answered   Past surgical hx, Family hx, Social hx all reviewed.  Current Outpatient Prescriptions on File Prior to Visit  Medication Sig  . albuterol (PROVENTIL) (2.5 MG/3ML) 0.083% nebulizer solution 1 vial in neb every 6 hours and as needed Dx J45.909 (Patient taking differently: Take 2.5 mg by nebulization every 4 (four) hours as needed for wheezing or shortness of breath. 1 vial in neb every 6 hours and as needed Dx J45.909)  . Albuterol Sulfate (PROAIR RESPICLICK) 093 (90 BASE) MCG/ACT AEPB Inhale 1-2 puffs into the lungs as needed (for shortness of breath and/or wheezing.).  Marland Kitchen ALPRAZolam (XANAX) 0.25 MG tablet Take 0.25 mg by mouth at bedtime as needed for sleep.   . Ascorbic Acid (VITAMIN C) 1000 MG tablet Take 1,000 mg by mouth daily.  Marland Kitchen CALCIUM PO Take 1,200 mg by mouth daily.   . clopidogrel (PLAVIX) 75 MG tablet TAKE 1 TABLET(75 MG) BY MOUTH EVERY OTHER DAY  . diclofenac sodium (VOLTAREN) 1 % GEL APP THIN LAYER EXTERNALLY TO AFFECT AREA(S) UP TO FOUR TIMES DAILY AS NEEDED FOR PAIN  . isosorbide mononitrate (IMDUR) 30 MG 24 hr tablet TAKE 1 TABLET(30 MG) BY MOUTH DAILY  . meloxicam (MOBIC) 7.5 MG tablet Take 7.5 mg by mouth 2 (two) times daily as needed for pain (and inflammation).   . Nebulizers (COMPRESSOR NEBULIZER) MISC 1 Device by Does not apply route once.  . nitroGLYCERIN (NITROSTAT) 0.4 MG SL tablet Place 1 tablet (0.4 mg total) under the tongue every 5 (five) minutes as needed for chest pain.  . valsartan-hydrochlorothiazide (DIOVAN-HCT) 160-25 MG tablet Take 1 tablet by mouth daily.   No current facility-administered medications on file prior to visit.      Allergies  Allergen Reactions  . Aspirin Anaphylaxis, Hives and Shortness Of Breath  . Hydroxyquinolines Hives  . Imipramine Hcl Other (See Comments)    UNKNOWN TO PATIENT  . Pamelor [Nortriptyline Hcl] Other (See Comments)    unknown  . Prednisone Shortness Of Breath and Itching    anxiety  . Procardia  [Nifedipine] Other (See Comments)    Unknown   . Tramadol Swelling  . Ace Inhibitors Other (See Comments)    cough  . Plaquenil [Hydroxychloroquine Sulfate] Rash    Review Of Systems:  Constitutional:   No  weight loss, night sweats,  Fevers, chills,+ fatigue, or  lassitude.  HEENT:   No headaches,  Difficulty swallowing,  Tooth/dental problems, or  Sore throat,                No sneezing, itching, ear ache, nasal congestion, post nasal drip,  CV:  No chest pain,  Orthopnea, PND, swelling in lower extremities, anasarca, dizziness, palpitations, syncope.   GI  No heartburn, indigestion, abdominal pain, nausea, vomiting, diarrhea, change in bowel habits, loss of appetite, bloody stools.   Resp: + shortness of breath with exertion or at rest.  No excess mucus, no productive cough,  No non-productive cough,  No coughing up of blood.  No change in color of mucus.  No wheezing.  No chest wall deformity  Skin: no rash or lesions.  GU: no dysuria, change in color of urine, no urgency or frequency.  No flank pain, no hematuria   MS:  No joint pain or swelling.  No decreased range of motion.  No back pain.  Psych:  No change in mood or affect. No depression or anxiety.  No memory loss.   Vital Signs BP 130/66 (Cuff Size: Normal)   Pulse 68   Ht 5' 4.5" (1.638 m)   Wt 200 lb 12.8 oz (91.1 kg)   SpO2 95%   BMI 33.93 kg/m    Physical Exam:  General- No distress,  A&Ox3, pleasant ENT: No sinus tenderness, TM clear, pale nasal mucosa, no oral exudate,no post nasal drip, no LAN, atraumatic normocephalic Cardiac: S1, S2, regular rate and rhythm, no murmur Chest: No wheeze/ rales/ dullness; no accessory muscle use, no nasal flaring, no sternal retractions Abd.: Soft Non-tender, obese Ext: No clubbing cyanosis, edema Neuro: Deconditioned at baseline, moving all extremities 4, alert and oriented 3 Skin: No rashes, warm and dry Psych: normal mood and  behavior   Assessment/Plan  Asthma Worsening dyspnea with exertion over the past year Plan: We will schedule you for PFT's  Please continue using your Symbicort 2 puffs twice daily. Try using your Rescue inhaler as needed up to every 6 hours for shortness of breath or wheezing. We will walk you in the office today to see if you are maintaining your oxygen levels. Please think about pulmonary rehab and let us know if you are interested. Follow up in 1 month after PFT's  O and O to determine if you are dropping your oxygen saturations at night. Please contact office for sooner follow up if symptoms do not improve or worsen or seek emergency care       Magdalen Spatz, NP 03/03/2017  1:19 PM

## 2017-03-03 NOTE — Patient Instructions (Addendum)
It is nice to meet you today. We will schedule you for PFT's  Please continue using your Symbicort 2 puffs twice daily. Try using your Rescue inhaler as needed up to every 6 hours for shortness of breath or wheezing. We will walk you in the office today to see if you are maintaining your oxygen levels. Please think about pulmonary rehab and let us know if you are interested. Follow up in 1 month after PFT's  O and O to determine if you are dropping your oxygen saturations at night. Please contact office for sooner follow up if symptoms do not improve or worsen or seek emergency care

## 2017-03-05 ENCOUNTER — Encounter: Payer: Self-pay | Admitting: Acute Care

## 2017-03-17 ENCOUNTER — Ambulatory Visit (INDEPENDENT_AMBULATORY_CARE_PROVIDER_SITE_OTHER): Payer: Medicare Other | Admitting: Pulmonary Disease

## 2017-03-17 DIAGNOSIS — R0602 Shortness of breath: Secondary | ICD-10-CM | POA: Diagnosis not present

## 2017-03-17 LAB — PULMONARY FUNCTION TEST
DL/VA % pred: 118 %
DL/VA: 5.27 ml/min/mmHg/L
DLCO cor % pred: 104 %
DLCO cor: 21.87 ml/min/mmHg
DLCO unc % pred: 98 %
DLCO unc: 20.63 ml/min/mmHg
FEF 25-75 Post: 1.16 L/sec
FEF 25-75 Pre: 0.84 L/sec
FEF2575-%Change-Post: 38 %
FEF2575-%Pred-Post: 105 %
FEF2575-%Pred-Pre: 76 %
FEV1-%Change-Post: 8 %
FEV1-%Pred-Post: 94 %
FEV1-%Pred-Pre: 87 %
FEV1-Post: 1.52 L
FEV1-Pre: 1.4 L
FEV1FVC-%Change-Post: 7 %
FEV1FVC-%Pred-Pre: 96 %
FEV6-%Change-Post: 1 %
FEV6-%Pred-Post: 97 %
FEV6-%Pred-Pre: 96 %
FEV6-Post: 2 L
FEV6-Pre: 1.96 L
FEV6FVC-%Change-Post: 1 %
FEV6FVC-%Pred-Post: 106 %
FEV6FVC-%Pred-Pre: 105 %
FVC-%Change-Post: 0 %
FVC-%Pred-Post: 91 %
FVC-%Pred-Pre: 91 %
FVC-Post: 2 L
FVC-Pre: 1.98 L
Post FEV1/FVC ratio: 76 %
Post FEV6/FVC ratio: 100 %
Pre FEV1/FVC ratio: 70 %
Pre FEV6/FVC Ratio: 99 %
RV % pred: 117 %
RV: 2.73 L
TLC % pred: 101 %
TLC: 4.73 L

## 2017-03-17 NOTE — Progress Notes (Signed)
PFT done today. 

## 2017-03-31 ENCOUNTER — Ambulatory Visit (INDEPENDENT_AMBULATORY_CARE_PROVIDER_SITE_OTHER): Payer: Medicare Other | Admitting: Acute Care

## 2017-03-31 ENCOUNTER — Other Ambulatory Visit: Payer: Self-pay | Admitting: Acute Care

## 2017-03-31 ENCOUNTER — Encounter: Payer: Self-pay | Admitting: Acute Care

## 2017-03-31 DIAGNOSIS — R918 Other nonspecific abnormal finding of lung field: Secondary | ICD-10-CM

## 2017-03-31 DIAGNOSIS — R0602 Shortness of breath: Secondary | ICD-10-CM | POA: Diagnosis not present

## 2017-03-31 DIAGNOSIS — G4734 Idiopathic sleep related nonobstructive alveolar hypoventilation: Secondary | ICD-10-CM | POA: Insufficient documentation

## 2017-03-31 MED ORDER — ALBUTEROL SULFATE 108 (90 BASE) MCG/ACT IN AEPB: 1.0000 | INHALATION_SPRAY | Freq: Four times a day (QID) | RESPIRATORY_TRACT | 6 refills | Status: DC | PRN

## 2017-03-31 MED ORDER — PANTOPRAZOLE SODIUM 20 MG PO TBEC: 20.0000 mg | DELAYED_RELEASE_TABLET | Freq: Every evening | ORAL | 0 refills | Status: DC

## 2017-03-31 MED ORDER — BUDESONIDE-FORMOTEROL FUMARATE 160-4.5 MCG/ACT IN AERO: 2.0000 | INHALATION_SPRAY | Freq: Two times a day (BID) | RESPIRATORY_TRACT | 0 refills | Status: DC

## 2017-03-31 NOTE — Assessment & Plan Note (Signed)
Nocturnal hypoxia per O&O 02/2017 Plan: We will order oxygen 2 L for use at bedtime We will order a split night study to assess for OSA We will call with results Follow up with Dr. Elsworth Soho in 3 months or before as needed. Please contact office for sooner follow up if symptoms do not improve or worsen or seek emergency care

## 2017-03-31 NOTE — Progress Notes (Signed)
History of Present Illness Sheryl Curtis is a 83 y.o. female remote smoker with mild persistent asthma and RA, with previous MTX use. She is followed by Dr. Elsworth Soho.   Significant tests/ events CT abdomen 03/2014 - showed bilateral noncalcified pulmonary nodules, largest measuring 7 mm in the posterior right lower lobe. This was not noted on a prior CT in 2003. She was placed on Spiriva in 2011  chest CT 05/2014 showed scattered nodules all less than 8 mm. ACE inhibitor stopped in past for cough  CT chest 01/06/15 - Stable appearance of scattered numerous bilateral pulmonary nodules measuring up to 8 mm  Spirometry in 05/2010 showed FEV1 of 76% improvement postbronchodilator to 80%-1.60 with a ratio of 75 suggesting mild restriction. Spirometry 03/2014 FEV1 of 1.25-64%, with ratio 65 and FVC of 1.91-72%.  PFTs - 12/2014 - no airway obstruction, ratio 71, pos BD response PFT's 02/2017: Mild Restriction, + BD response  06/2015 Admit for UTI and Sepsis   CT chest 01/2016 previous measured nodules unchanged from 05/2014 . There were new nodule RLL at 74mm .    03/31/2017 Follow Up OV: Pt. Presents for follow up. She states she is more short of breath with exertion  due to increased humidity. This is just with exertion and when she is outside. She is compliant with her Symbicort daily.She is using nebulizer treatments once monthly. She is not using a rescue inhaler at all.She continues to complain of poor sleep. She completed O&O which indicates she needs nocturnal oxygen.She denies chest pain, fever, orthopnea or hemoptysis.She has not started working on the stepper she has at home to increase her conditioning.  Test Results:  CBC Latest Ref Rng & Units 09/15/2016 08/03/2016 07/06/2015  WBC 4.0 - 10.5 K/uL 19.4(H) 10.6(H) 6.9  Hemoglobin 12.0 - 15.0 g/dL 16.5(H) 12.2 9.4(L)  Hematocrit 36.0 - 46.0 % 47.7(H) 37.1 28.3(L)  Platelets 150 - 400 K/uL 315 249 171    BMP Latest Ref Rng & Units 09/15/2016  08/05/2016 08/03/2016  Glucose 65 - 99 mg/dL 136(H) 99 117(H)  BUN 6 - 20 mg/dL 25(H) 20 23(H)  Creatinine 0.44 - 1.00 mg/dL 1.46(H) 1.21(H) 1.03(H)  Sodium 135 - 145 mmol/L 132(L) 131(L) 134(L)  Potassium 3.5 - 5.1 mmol/L 3.9 4.7 4.0  Chloride 101 - 111 mmol/L 97(L) 97(L) 101  CO2 22 - 32 mmol/L 20(L) 28 25  Calcium 8.9 - 10.3 mg/dL 10.0 8.9 9.3    BNP    Component Value Date/Time   BNP 82.0 07/01/2015 1653    ProBNP    Component Value Date/Time   PROBNP 28.0 02/23/2014 1222    PFT    Component Value Date/Time   FEV1PRE 1.40 03/17/2017 1217   FEV1POST 1.52 03/17/2017 1217   FVCPRE 1.98 03/17/2017 1217   FVCPOST 2.00 03/17/2017 1217   TLC 4.73 03/17/2017 1217   DLCOUNC 20.63 03/17/2017 1217   PREFEV1FVCRT 70 03/17/2017 1217   PSTFEV1FVCRT 76 03/17/2017 1217   Mild obstruction   Past medical hx Past Medical History:  Diagnosis Date  . ALLERGIC RHINITIS   . Atrial fibrillation ([name])   . CANCER, COLORECTAL   . CHEST PAIN, ATYPICAL   . DYSPNEA   . Essential hypertension, benign   . FIBROMYALGIA   . G E R D   . History of stress test    Myoview 8/16:  EF 69%, anterior and apical defect c/w breast attenuation; Low Risk   . INSOMNIA   . Other and unspecified angina  pectoris      Social History  Substance Use Topics  . Smoking status: Former Smoker    Packs/day: 1.00    Years: 12.00    Types: Cigarettes    Start date: 10/28/1949    Quit date: 10/28/1962  . Smokeless tobacco: Never Used  . Alcohol use No    Tobacco Cessation: Former smoker quit in 1964  Past surgical hx, Family hx, Social hx all reviewed.  Current Outpatient Prescriptions on File Prior to Visit  Medication Sig  . albuterol (PROVENTIL) (2.5 MG/3ML) 0.083% nebulizer solution 1 vial in neb every 6 hours and as needed Dx J45.909 (Patient taking differently: Take 2.5 mg by nebulization every 4 (four) hours as needed for wheezing or shortness of breath. 1 vial in neb every 6 hours and as needed Dx  J45.909)  . ALPRAZolam (XANAX) 0.25 MG tablet Take 0.25 mg by mouth at bedtime as needed for sleep.   . Ascorbic Acid (VITAMIN C) 1000 MG tablet Take 1,000 mg by mouth daily.  . budesonide-formoterol (SYMBICORT) 160-4.5 MCG/ACT inhaler Inhale 2 puffs into the lungs 2 (two) times daily.  Marland Kitchen CALCIUM PO Take 1,200 mg by mouth daily.   . clopidogrel (PLAVIX) 75 MG tablet TAKE 1 TABLET(75 MG) BY MOUTH EVERY OTHER DAY  . diclofenac sodium (VOLTAREN) 1 % GEL APP THIN LAYER EXTERNALLY TO AFFECT AREA(S) UP TO FOUR TIMES DAILY AS NEEDED FOR PAIN  . HYDROcodone-acetaminophen (NORCO) 7.5-325 MG tablet Take 1 tablet by mouth every 6 (six) hours as needed for moderate pain.  . isosorbide mononitrate (IMDUR) 30 MG 24 hr tablet TAKE 1 TABLET(30 MG) BY MOUTH DAILY  . meloxicam (MOBIC) 7.5 MG tablet Take 7.5 mg by mouth 2 (two) times daily as needed for pain (and inflammation).   . Nebulizers (COMPRESSOR NEBULIZER) MISC 1 Device by Does not apply route once.  . nitroGLYCERIN (NITROSTAT) 0.4 MG SL tablet Place 1 tablet (0.4 mg total) under the tongue every 5 (five) minutes as needed for chest pain.  . valsartan-hydrochlorothiazide (DIOVAN-HCT) 160-25 MG tablet Take 1 tablet by mouth daily.  . budesonide-formoterol (SYMBICORT) 160-4.5 MCG/ACT inhaler Inhale 2 puffs into the lungs 2 (two) times daily.   No current facility-administered medications on file prior to visit.      Allergies  Allergen Reactions  . Aspirin Anaphylaxis, Hives and Shortness Of Breath  . Hydroxyquinolines Hives  . Imipramine Hcl Other (See Comments)    UNKNOWN TO PATIENT  . Pamelor [Nortriptyline Hcl] Other (See Comments)    unknown  . Prednisone Shortness Of Breath and Itching    anxiety  . Procardia [Nifedipine] Other (See Comments)    Unknown   . Tramadol Swelling  . Ace Inhibitors Other (See Comments)    cough  . Plaquenil [Hydroxychloroquine Sulfate] Rash    Review Of Systems:  Constitutional:   No  weight loss, night  sweats,  Fevers, chills, +fatigue, or  lassitude.  HEENT:   No headaches,  Difficulty swallowing,  Tooth/dental problems, or  Sore throat,                No sneezing, itching, ear ache, nasal congestion, post nasal drip,   CV:  No chest pain,  Orthopnea, PND, swelling in lower extremities, anasarca, dizziness, palpitations, syncope.   GI  No heartburn, indigestion, abdominal pain, nausea, vomiting, diarrhea, change in bowel habits, loss of appetite, bloody stools.   Resp: + shortness of breath with exertion not  at rest.  No excess mucus, no  productive cough,  + non-productive cough,  No coughing up of blood.  No change in color of mucus.  No wheezing.  No chest wall deformity  Skin: no rash or lesions.  GU: no dysuria, change in color of urine, no urgency or frequency.  No flank pain, no hematuria   MS:  No joint pain or swelling.  No decreased range of motion.  No back pain.  Psych:  No change in mood or affect. No depression or anxiety.  No memory loss.   Vital Signs BP (!) 130/50 (BP Location: Left Arm, Patient Position: Sitting, Cuff Size: Large)   Pulse 72   Ht 5' 4.5" (1.638 m)   Wt 199 lb (90.3 kg)   SpO2 94%   BMI 33.63 kg/m   Body mass index is 33.63 kg/m.  Physical Exam:  General- No distress,  A&Ox3, pleasant, walks with a cane ENT: No sinus tenderness, TM clear, pale nasal mucosa, no oral exudate,no post nasal drip, no LAN Cardiac: S1, S2, regular rate and rhythm, no murmur Chest: No wheeze/ rales/ dullness; no accessory muscle use, no nasal flaring, no sternal retractions, diminished per bases Abd.: Soft Non-tender, obese Ext: No clubbing cyanosis, edema Neuro:deconditioned at baseline Skin: No rashes, warm and dry Psych: normal mood and behavior   Assessment/Plan  DYSPNEA Dyspnea with exertion Does not use Rescue inhaler or nebs on a regular basis Plan: Continue Symbicort 2 puffs twice daily. Rinse mouth after use We will send in a prescription for  a new rescue inhaler. Use this for shortness of breath up to every 6 hours as needed. Protonix 20 mg every night at bedtime x 1 month. Call if you feel this is helping. We will give you a copy of the GERD diet. We will schedule a CT chest without contrast as follow up of pulmonary nodules We will call with CT results. Follow up with Dr. Elsworth Soho in 3 months Please contact office for sooner follow up if symptoms do not improve or worsen or seek emergency care    Nocturnal hypoxia Nocturnal hypoxia per O&O 02/2017 Plan: We will order oxygen 2 L for use at bedtime We will order a split night study to assess for OSA We will call with results Follow up with Dr. Elsworth Soho in 3 months or before as needed. Please contact office for sooner follow up if symptoms do not improve or worsen or seek emergency care    Pulmonary nodules/lesions, multiple 12 month Follow up CT to assess pulmonary nodules Plan: We will schedule CT chest this month    Magdalen Spatz, NP 03/31/2017  5:31 PM

## 2017-03-31 NOTE — Patient Instructions (Addendum)
It is good to see you today. We will send in a prescription for a new rescue inhaler. Use this for shortness of breath up to every 6 hours as needed. Protonix 20 mg every night at bedtime x 1 month. Call if you feel this is helping. We will give you a copy of the GERD diet. We will schedule a CT chest without contrast as follow up of pulmonary nodules We will call with CT results. We will have the O&O results called to you. Follow up with Dr. Elsworth Soho in 3 months Please contact office for sooner follow up if symptoms do not improve or worsen or seek emergency care

## 2017-03-31 NOTE — Assessment & Plan Note (Addendum)
Dyspnea with exertion Does not use Rescue inhaler or nebs on a regular basis Plan: Continue Symbicort 2 puffs twice daily. Rinse mouth after use We will send in a prescription for a new rescue inhaler. Use this for shortness of breath up to every 6 hours as needed. Protonix 20 mg every night at bedtime x 1 month. Call if you feel this is helping. We will give you a copy of the GERD diet. We will schedule a CT chest without contrast as follow up of pulmonary nodules We will call with CT results. Follow up with Dr. Elsworth Soho in 3 months Please contact office for sooner follow up if symptoms do not improve or worsen or seek emergency care

## 2017-03-31 NOTE — Assessment & Plan Note (Signed)
12 month Follow up CT to assess pulmonary nodules Plan: We will schedule CT chest this month

## 2017-04-02 ENCOUNTER — Telehealth: Payer: Self-pay | Admitting: Acute Care

## 2017-04-02 DIAGNOSIS — G4734 Idiopathic sleep related nonobstructive alveolar hypoventilation: Secondary | ICD-10-CM

## 2017-04-02 NOTE — Telephone Encounter (Signed)
Message per SG "Please order nocturnal oxygen at 2 L for this patient. Please call patient and let her know she qualifies for nocturnal oxygen. Also place an order for a split night sleep study to evaluate for OSA. Thanks so much"  Contact patient and made her aware of this information. Pt was informed someone will call her to get nocturnal oxygen and split night study set up. Pt verbalized understanding. Orders were placed for split night and nocturnal oxygen. Nothing further is needed.

## 2017-04-03 ENCOUNTER — Telehealth: Payer: Self-pay | Admitting: Acute Care

## 2017-04-03 LAB — HM DEXA SCAN

## 2017-04-03 NOTE — Telephone Encounter (Signed)
Attempted to contact Zillah at [facility]. There was no answer and I could not leave a message. Order was placed under SG.  SG - can you please sign this order so that it does not expire? Thanks.

## 2017-04-03 NOTE — Telephone Encounter (Signed)
Sheryl Curtis returned phone call [phone] ext [phone]): pt OV notes mentioned order for split night study for OSA; disqualifies the ONO for the oxygen.Marland KitchenMarland Kitchen

## 2017-04-04 NOTE — Telephone Encounter (Signed)
Per Jason/[facility], need order placed for split night study.  Order placed 04/02/17. Pt not scheduled for Sleep Study 05/27/17.   Corene Cornea states that he will talk to the patient about what she wants to do in the meantime about getting equipment - wait or private pay. He will call back and let us know.

## 2017-04-04 NOTE — Telephone Encounter (Signed)
lmomtcb x 1 for Sheryl Curtis with [facility] 

## 2017-04-07 ENCOUNTER — Ambulatory Visit (INDEPENDENT_AMBULATORY_CARE_PROVIDER_SITE_OTHER)
Admission: RE | Admit: 2017-04-07 | Discharge: 2017-04-07 | Disposition: A | Discharge status: Home / Self Care | Payer: Medicare Other | Source: Ambulatory Visit | Attending: Acute Care | Admitting: Acute Care

## 2017-04-07 DIAGNOSIS — R918 Other nonspecific abnormal finding of lung field: Secondary | ICD-10-CM | POA: Diagnosis not present

## 2017-04-07 NOTE — Telephone Encounter (Signed)
Waiting on return call from [place] from [facility] to let us know how the pt wants to proceed.

## 2017-04-08 NOTE — Telephone Encounter (Signed)
LMTCB for Jason  

## 2017-04-08 NOTE — Progress Notes (Signed)
Reviewed & agree with plan  

## 2017-04-09 ENCOUNTER — Telehealth: Payer: Self-pay | Admitting: Acute Care

## 2017-04-09 MED ORDER — ALBUTEROL SULFATE (2.5 MG/3ML) 0.083% IN NEBU: INHALATION_SOLUTION | RESPIRATORY_TRACT | 11 refills | Status: DC

## 2017-04-09 NOTE — Telephone Encounter (Signed)
LM x 2 for [employer]

## 2017-04-09 NOTE — Telephone Encounter (Signed)
Pt requesting refill on albuterol neb solution.  This has been sent to preferred pharmacy.  Nothing further needed.

## 2017-04-10 NOTE — Telephone Encounter (Signed)
lmtcb x3 for [employer].

## 2017-04-14 NOTE — Telephone Encounter (Signed)
We have not received a call back from [place]. Per triage protocol, message will be closed.

## 2017-04-21 ENCOUNTER — Ambulatory Visit (INDEPENDENT_AMBULATORY_CARE_PROVIDER_SITE_OTHER): Payer: Medicare Other | Admitting: Orthopaedic Surgery

## 2017-04-21 ENCOUNTER — Other Ambulatory Visit (INDEPENDENT_AMBULATORY_CARE_PROVIDER_SITE_OTHER): Payer: Self-pay

## 2017-04-21 DIAGNOSIS — M7061 Trochanteric bursitis, right hip: Secondary | ICD-10-CM | POA: Insufficient documentation

## 2017-04-21 DIAGNOSIS — G8929 Other chronic pain: Secondary | ICD-10-CM

## 2017-04-21 DIAGNOSIS — M48061 Spinal stenosis, lumbar region without neurogenic claudication: Secondary | ICD-10-CM | POA: Diagnosis not present

## 2017-04-21 DIAGNOSIS — M5442 Lumbago with sciatica, left side: Principal | ICD-10-CM

## 2017-04-21 MED ORDER — LIDOCAINE HCL 1 % IJ SOLN
3.0000 mL | INTRAMUSCULAR | Status: AC | PRN
  Administered 2017-04-21: 3 mL

## 2017-04-21 MED ORDER — METHYLPREDNISOLONE ACETATE 40 MG/ML IJ SUSP
40.0000 mg | INTRAMUSCULAR | Status: AC | PRN
  Administered 2017-04-21: 40 mg via INTRA_ARTICULAR

## 2017-04-21 NOTE — Progress Notes (Signed)
Office Visit Note   Patient: Sheryl Curtis           Date of Birth: 1932/06/16           MRN: [id number] Visit Date: 04/21/2017              Requested by: Alroy Dust, L.Marlou Sa, [place] [address] [employer] [address] [place], [place] [address] PCP: Alroy Dust, L.Marlou Sa, MD   Assessment & Plan: Visit Diagnoses:  1. Trochanteric bursitis, right hip   2. Spinal stenosis of lumbar region, unspecified whether neurogenic claudication present     Plan:She did request a trochanteric injection in the bursa of her right hip and I was agreeable to this. She has had a right L4 transforaminal injection by Dr. Ernestina Patches back in November of last year. She said that injection helped greatly and she like to see Dr. Ernestina Patches for repeat injection there to treat her severe stenosis at that level. We will work on getting that scheduled as well. I will see her back myself in 6 weeks and hopefully she'll have made good progress. I talked her about weight loss as well. She is on chronic pain medications to another physician.  Follow-Up Instructions: Return in about 6 weeks (around 06/02/2017).   Orders:  Orders Placed This Encounter  Procedures  . Large Joint Injection/Arthrocentesis   No orders of the defined types were placed in this encounter.     Procedures: Large Joint Inj Date/Time: 04/21/2017 11:04 AM Performed by: Mcarthur Rossetti Authorized by: Mcarthur Rossetti   Location:  Hip Site:  R greater trochanter Ultrasound Guidance: No   Fluoroscopic Guidance: No   Arthrogram: No   Medications:  3 mL lidocaine 1 %; 40 mg methylPREDNISolone acetate 40 MG/ML     Clinical Data: No additional findings.   Subjective: No chief complaint on file. Patient comes in with worsening low back pain with radicular symptoms going down her right leg. She also has bilateral hip pain and she points the trochanteric area of both her hips. This is a long-standing problem we seen her for this before. It is been  worsening recently. His detrimentally affecting her activities daily living, her mobility, and her quality of life. She does have a history of myalgia and she is on chronic pain medications. She has known severe stenosis at L4-L5 on the right and L5-S1 left. She had successful right L4 transforaminal injection by Dr. Ernestina Patches back in November 2018. She denies any change in bowel or bladder function but just significant pain. She does not walk with assistive device either.  HPI  Review of Systems She denies any headache, chest pain, short of breath, fever, chills, nausea, vomiting.  Objective: Vital Signs: There were no vitals taken for this visit.  Physical Exam She gets in and out of a chair slowly and upon exam table slowly. She is alert and oriented 3 and in no acute distress Ortho Exam Both hips have fluid range of motion internal and actually as well as actively and passively. Both knee exams are benign. Both hips have pain over the trochanteric area which is worse on the right the left. She has limited mobility of her lumbar spine with pain more so going down her right leg with some numbness on the lateral aspect of her right leg. Specialty Comments:  No specialty comments available.  Imaging: No results found.   PMFS History: Patient Active Problem List   Diagnosis Date Noted  . Trochanteric bursitis, right hip 04/21/2017  .  Nocturnal hypoxia 03/31/2017  . Sinus bradycardia 08/05/2016  . Hypertensive urgency 08/03/2016  . Asthma 02/19/2016  . Chronic kidney disease (CKD), stage III (moderate) 07/05/2015  . CAFL (chronic airflow limitation) ([place]) 07/05/2015  . Generalized OA 07/05/2015  . 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 H/O malignant neoplasm of colon 07/05/2015  . Elevated troponin 07/02/2015  . UTI (lower urinary tract infection) 07/01/2015  . Fibromyalgia syndrome 07/01/2015  . Hyponatremia 07/01/2015  . Urinary tract infectious disease   . Pulmonary  nodules/lesions, multiple 04/11/2014  . Abdominal pain, generalized 04/07/2014  . Coronary atherosclerosis of native coronary artery 10/12/2013  . Essential hypertension, benign   . Other and unspecified angina pectoris   . Atrial fibrillation ([place]) 04/19/2010  . ALLERGIC RHINITIS 04/19/2010  . DYSPNEA 04/19/2010  . CHEST PAIN, ATYPICAL 04/19/2010  . CANCER, COLORECTAL 04/18/2010  . Essential hypertension 04/18/2010  . G E R D 04/18/2010  . FIBROMYALGIA 04/18/2010  . INSOMNIA 04/18/2010   Past Medical History:  Diagnosis Date  . ALLERGIC RHINITIS   . Atrial fibrillation ([place])   . CANCER, COLORECTAL   . CHEST PAIN, ATYPICAL   . DYSPNEA   . Essential hypertension, benign   . FIBROMYALGIA   . G E R D   . History of stress test    Myoview 8/16:  EF 69%, anterior and apical defect c/w breast attenuation; Low Risk   . INSOMNIA   . Other and unspecified angina pectoris     Family History  Problem Relation Age of Onset  . CAD Brother   . Heart attack Brother 28  . Bone cancer Sister   . Throat cancer Daughter     Past Surgical History:  Procedure Laterality Date  . ABDOMINAL HYSTERECTOMY    . BACK SURGERY    . BREAST SURGERY    . COLON SURGERY    . CORONARY STENT PLACEMENT    . PERCUTANEOUS CORONARY STENT INTERVENTION (PCI-S) N/A 05/19/2013   Procedure: PERCUTANEOUS CORONARY STENT INTERVENTION (PCI-S);  Surgeon: Jettie Booze, MD;  Location: [facility] C[facility] LAB;  Service: Cardiovascular;  Laterality: N/A;   Social History   Occupational History  . Not on file.   Social History Main Topics  . Smoking status: Former Smoker    Packs/day: 1.00    Years: 12.00    Types: Cigarettes    Start date: 10/28/1949    Quit date: 10/28/1962  . Smokeless tobacco: Never Used  . Alcohol use No  . Drug use: No  . Sexual activity: Not on file

## 2017-04-23 ENCOUNTER — Ambulatory Visit (INDEPENDENT_AMBULATORY_CARE_PROVIDER_SITE_OTHER): Payer: PPO | Admitting: Orthopaedic Surgery

## 2017-05-05 ENCOUNTER — Telehealth (INDEPENDENT_AMBULATORY_CARE_PROVIDER_SITE_OTHER): Payer: Self-pay

## 2017-05-05 NOTE — Telephone Encounter (Signed)
She should still keep the appointment with [facility] to see if that will help the bacl-component of her pain.

## 2017-05-05 NOTE — Telephone Encounter (Signed)
I put the order in and tried calling [facility] but it kept going to their voicemail. Would you mind continuing to try since clinic is about to start? I told patient we would call her back when we got a time

## 2017-05-05 NOTE — Telephone Encounter (Signed)
Patient would like a call from Dr. Ninfa Linden.  Cb# is [phone].  Thank You.

## 2017-05-05 NOTE — Telephone Encounter (Signed)
Patient aware of the below message from CB 

## 2017-05-05 NOTE — Telephone Encounter (Signed)
Patient states the pain isn't running down her leg anymore, still has some back pain without radiculopathy Keep injection with [facility] Wednesday?

## 2017-05-07 ENCOUNTER — Ambulatory Visit (INDEPENDENT_AMBULATORY_CARE_PROVIDER_SITE_OTHER): Payer: Medicare Other | Admitting: Physical Medicine and Rehabilitation

## 2017-05-07 ENCOUNTER — Ambulatory Visit (INDEPENDENT_AMBULATORY_CARE_PROVIDER_SITE_OTHER): Payer: Medicare Other

## 2017-05-07 ENCOUNTER — Encounter (INDEPENDENT_AMBULATORY_CARE_PROVIDER_SITE_OTHER): Payer: Self-pay | Admitting: Physical Medicine and Rehabilitation

## 2017-05-07 VITALS — BP 142/62 | HR 68

## 2017-05-07 DIAGNOSIS — M5416 Radiculopathy, lumbar region: Secondary | ICD-10-CM

## 2017-05-07 DIAGNOSIS — M47816 Spondylosis without myelopathy or radiculopathy, lumbar region: Secondary | ICD-10-CM

## 2017-05-07 DIAGNOSIS — M545 Low back pain, unspecified: Secondary | ICD-10-CM

## 2017-05-07 DIAGNOSIS — G8929 Other chronic pain: Secondary | ICD-10-CM

## 2017-05-07 MED ORDER — METHYLPREDNISOLONE ACETATE 80 MG/ML IJ SUSP
80.0000 mg | Freq: Once | INTRAMUSCULAR | Status: AC
  Administered 2017-05-07: 80 mg

## 2017-05-07 MED ORDER — LIDOCAINE HCL (PF) 1 % IJ SOLN
2.0000 mL | Freq: Once | INTRAMUSCULAR | Status: AC
  Administered 2017-05-07: 2 mL

## 2017-05-07 NOTE — Progress Notes (Deleted)
Pain across lower back. Right=left. On and off pain down both legs to feet. Denies numbness and tingling.

## 2017-05-07 NOTE — Procedures (Signed)
Lumbar Facet Joint Intra-Articular Injection(s) with Fluoroscopic Guidance  Patient: Sheryl Curtis      Date of Birth: [birth year] MRN: [id number] PCP: Alroy Dust, L.Marlou Sa, MD      Visit Date: 05/07/2017   Universal Protocol:    Date/Time: 07/11/185:49 PM  Consent Given By: the patient  Position: PRONE   Additional Comments: Vital signs were monitored before and after the procedure. Patient was prepped and draped in the usual sterile fashion. The correct patient, procedure, and site was verified.   Injection Procedure Details:  Procedure Site One Meds Administered:  Meds ordered this encounter  Medications  . lidocaine (PF) (XYLOCAINE) 1 % injection 2 mL  . methylPREDNISolone acetate (DEPO-MEDROL) injection 80 mg     Laterality: Bilateral  Location/Site:  L4-L5  Needle size: 22 guage  Needle type: Spinal  Needle Placement: Articular  Findings:  -Contrast Used: 1 mL iohexol 180 mg iodine/mL   -Comments: Excellent flow of contrast producing a partial arthrogram.  Procedure Details: The fluoroscope beam is vertically oriented in AP, and the inferior recess is visualized beneath the lower pole of the inferior apophyseal process, which represents the target point for needle insertion. When direct visualization is difficult the target point is located at the medial projection of the vertebral pedicle. The region overlying each aforementioned target is locally anesthetized with a 1 to 2 ml. volume of 1% Lidocaine without Epinephrine.   The spinal needle was inserted into each of the above mentioned facet joints using biplanar fluoroscopic guidance. A 0.25 to 0.5 ml. volume of Isovue-250 was injected and a partial facet joint arthrogram was obtained. A single spot film was obtained of the resulting arthrogram.    One to 1.25 ml of the steroid/anesthetic solution was then injected into each of the facet joints noted above.   Additional Comments:  The patient tolerated the  procedure well Dressing: Band-Aid    Post-procedure details: Patient was observed during the procedure. Post-procedure instructions were reviewed.  Patient left the clinic in stable condition.

## 2017-05-07 NOTE — Progress Notes (Addendum)
Sheryl Curtis - 83 y.o. female MRN [id number]  Date of birth: Feb 13, 1932  Office Visit Note: Visit Date: 05/07/2017 PCP: Alroy Dust, L.Marlou Sa, MD Referred by: Alroy Dust, L.Marlou Sa, MD  Subjective: Chief Complaint  Patient presents with  . Lower Back - Pain   HPI: Sheryl Curtis is an 83 year old female accompanied by her daughter who provides some of the history as well. We saw her in November and completed a right L4 transforaminal epidural steroid injection. At the time she was having pretty significant radicular type pain and she said this helped greatly. She is somewhat of a poor historian has had some health issues since I saw her but mainly she says she's really been hurting since Christmas but it's hard to tell her she means her back or her hip or leg. Most of her pain is axial low back right equal to left is worse with standing. Is worse with going from sit to stand. Is better with rest. She is getting some referral into the hip and greater trochanteric area on the right but not really down the leg on most occasions although when it's really hurting it can go down into the anterior lateral calf and more of an L5 distribution. MRI findings have shown facet arthropathy as her main problem was some foraminal stenosis but not central stenosis. She has 5 foraminal stenosis 1 at L4 and 1 at L5. She really complaints today and tells me her back is the worst part of all of it. She has really been miserable in terms of her back pain. Medications aren't helping she's had therapy.    ROS Otherwise per HPI.  Assessment & Plan: Visit Diagnoses:  1. Spondylosis without myelopathy or radiculopathy, lumbar region   2. Lumbar radiculopathy   3. Chronic bilateral low back pain without sciatica     Plan: Findings:  I think most of her pain at this point really is facet mediated low back pain probably at the L4-5 region assistant looked like the area of most of her arthritis and corresponds to the level on her  back. This could cause a referral pattern into the hip area and it doesn't have to be nerve related. I want to complete diagnostic and therapeutic bilateral L4-5 facet joint blocks. If it doesn't seem to help in a few weeks we could look at transforaminal injection that did help her in the past although it could've been short lived but she is a poor historian. We talked about core strengthening and movement and she continues to try to stay mobile she's had a history of injections in the past with Dr. Maryjean Ka which were only helpful for short while as well. She also had a ground-level fall forward onto her right hip area back in April. She told me this in the procedure room and didn't want her daughter to know she had fallen. She had no other falls and has had no trouble bearing weight etc.    Meds & Orders:  Meds ordered this encounter  Medications  . lidocaine (PF) (XYLOCAINE) 1 % injection 2 mL  . methylPREDNISolone acetate (DEPO-MEDROL) injection 80 mg    Orders Placed This Encounter  Procedures  . XR C-ARM NO REPORT  . Epidural Steroid injection    Follow-up: Return if symptoms worsen or fail to improve.   Procedures: No procedures performed  Lumbar Facet Joint Intra-Articular Injection(s) with Fluoroscopic Guidance  Patient: Sheryl Curtis      Date of Birth: 03/21/1932 MRN: [id number] PCP:  Mitchell, L.Marlou Sa, MD      Visit Date: 05/07/2017   Universal Protocol:    Date/Time: 07/11/185:49 PM  Consent Given By: the patient  Position: PRONE   Additional Comments: Vital signs were monitored before and after the procedure. Patient was prepped and draped in the usual sterile fashion. The correct patient, procedure, and site was verified.   Injection Procedure Details:  Procedure Site One Meds Administered:  Meds ordered this encounter  Medications  . lidocaine (PF) (XYLOCAINE) 1 % injection 2 mL  . methylPREDNISolone acetate (DEPO-MEDROL) injection 80 mg     Laterality:  Bilateral  Location/Site:  L4-L5  Needle size: 22 guage  Needle type: Spinal  Needle Placement: Articular  Findings:  -Contrast Used: 1 mL iohexol 180 mg iodine/mL   -Comments: Excellent flow of contrast producing a partial arthrogram.  Procedure Details: The fluoroscope beam is vertically oriented in AP, and the inferior recess is visualized beneath the lower pole of the inferior apophyseal process, which represents the target point for needle insertion. When direct visualization is difficult the target point is located at the medial projection of the vertebral pedicle. The region overlying each aforementioned target is locally anesthetized with a 1 to 2 ml. volume of 1% Lidocaine without Epinephrine.   The spinal needle was inserted into each of the above mentioned facet joints using biplanar fluoroscopic guidance. A 0.25 to 0.5 ml. volume of Isovue-250 was injected and a partial facet joint arthrogram was obtained. A single spot film was obtained of the resulting arthrogram.    One to 1.25 ml of the steroid/anesthetic solution was then injected into each of the facet joints noted above.   Additional Comments:  The patient tolerated the procedure well Dressing: Band-Aid    Post-procedure details: Patient was observed during the procedure. Post-procedure instructions were reviewed.  Patient left the clinic in stable condition.     Clinical History: No specialty comments available.  She reports that she quit smoking about 54 years ago. Her smoking use included Cigarettes. She started smoking about 67 years ago. She has a 12.00 pack-year smoking history. She has never used smokeless tobacco. No results for input(s): HGBA1C, LABURIC in the last 8760 hours.  Objective:  VS:  HT:    WT:   BMI:     BP:(!) 142/62  HR:68bpm  TEMP: ( )  RESP:96 % Physical Exam  Musculoskeletal:  Patient ambulates with a cane. She has pain with extension of the lumbar spine. She has no pain with  hip rotation she has good distal strength.    Ortho Exam Imaging: No results found.  Past Medical/Family/Surgical/Social History: Medications & Allergies reviewed per EMR Patient Active Problem List   Diagnosis Date Noted  . Trochanteric bursitis, right hip 04/21/2017  . Nocturnal hypoxia 03/31/2017  . Sinus bradycardia 08/05/2016  . Hypertensive urgency 08/03/2016  . Asthma 02/19/2016  . Chronic kidney disease (CKD), stage III (moderate) 07/05/2015  . CAFL (chronic airflow limitation) ([place]) 07/05/2015  . Generalized OA 07/05/2015  . 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 H/O malignant neoplasm of colon 07/05/2015  . Elevated troponin 07/02/2015  . UTI (lower urinary tract infection) 07/01/2015  . Fibromyalgia syndrome 07/01/2015  . Hyponatremia 07/01/2015  . Urinary tract infectious disease   . Pulmonary nodules/lesions, multiple 04/11/2014  . Abdominal pain, generalized 04/07/2014  . Coronary atherosclerosis of native coronary artery 10/12/2013  . Essential hypertension, benign   . Other and unspecified angina pectoris   . Atrial fibrillation ([place]) 04/19/2010  .  ALLERGIC RHINITIS 04/19/2010  . DYSPNEA 04/19/2010  . CHEST PAIN, ATYPICAL 04/19/2010  . CANCER, COLORECTAL 04/18/2010  . Essential hypertension 04/18/2010  . G E R D 04/18/2010  . FIBROMYALGIA 04/18/2010  . INSOMNIA 04/18/2010   Past Medical History:  Diagnosis Date  . ALLERGIC RHINITIS   . Atrial fibrillation ([place])   . CANCER, COLORECTAL   . CHEST PAIN, ATYPICAL   . DYSPNEA   . Essential hypertension, benign   . FIBROMYALGIA   . G E R D   . History of stress test    Myoview 8/16:  EF 69%, anterior and apical defect c/w breast attenuation; Low Risk   . INSOMNIA   . Other and unspecified angina pectoris    Family History  Problem Relation Age of Onset  . CAD Brother   . Heart attack Brother 89  . Bone cancer Sister   . Throat cancer Daughter    Past Surgical History:  Procedure Laterality  Date  . ABDOMINAL HYSTERECTOMY    . BACK SURGERY    . BREAST SURGERY    . COLON SURGERY    . CORONARY STENT PLACEMENT    . PERCUTANEOUS CORONARY STENT INTERVENTION (PCI-S) N/A 05/19/2013   Procedure: PERCUTANEOUS CORONARY STENT INTERVENTION (PCI-S);  Surgeon: Jettie Booze, MD;  Location: [facility] CATH LAB;  Service: Cardiovascular;  Laterality: N/A;   Social History   Occupational History  . Not on file.   Social History Main Topics  . Smoking status: Former Smoker    Packs/day: 1.00    Years: 12.00    Types: Cigarettes    Start date: 10/28/1949    Quit date: 10/28/1962  . Smokeless tobacco: Never Used  . Alcohol use No  . Drug use: No  . Sexual activity: Not on file

## 2017-05-07 NOTE — Patient Instructions (Signed)

## 2017-05-08 ENCOUNTER — Encounter (HOSPITAL_BASED_OUTPATIENT_CLINIC_OR_DEPARTMENT_OTHER): Payer: Medicare Other

## 2017-05-13 ENCOUNTER — Ambulatory Visit (HOSPITAL_BASED_OUTPATIENT_CLINIC_OR_DEPARTMENT_OTHER): Payer: Medicare Other | Attending: Acute Care | Admitting: Pulmonary Disease

## 2017-05-13 VITALS — Ht 64.0 in | Wt 190.0 lb

## 2017-05-13 DIAGNOSIS — G4734 Idiopathic sleep related nonobstructive alveolar hypoventilation: Secondary | ICD-10-CM | POA: Insufficient documentation

## 2017-05-13 DIAGNOSIS — G4733 Obstructive sleep apnea (adult) (pediatric): Secondary | ICD-10-CM | POA: Insufficient documentation

## 2017-05-20 ENCOUNTER — Telehealth: Payer: Self-pay | Admitting: Acute Care

## 2017-05-20 DIAGNOSIS — G4733 Obstructive sleep apnea (adult) (pediatric): Secondary | ICD-10-CM | POA: Diagnosis not present

## 2017-05-20 NOTE — Telephone Encounter (Signed)
Opened in error

## 2017-05-20 NOTE — Procedures (Signed)
Patient Name: Sheryl Curtis, Sheryl Curtis Date: 05/13/2017 Gender: Female D.O.B: May 22, 1932 Age (years): 16 Referring Provider: Magdalen Spatz NP Height (inches): 64 Interpreting Physician: Kara Mead MD, ABSM Weight (lbs): 190 RPSGT: Zadie Rhine BMI: 33 MRN: [id number] Neck Size: 16.00   CLINICAL INFORMATION Sleep Study Type: NPSG  Indication for sleep study: Snoring  Epworth Sleepiness Score:9     SLEEP STUDY TECHNIQUE As per the AASM Manual for the Scoring of Sleep and Associated Events v2.3 (April 2016) with a hypopnea requiring 4% desaturations.  The channels recorded and monitored were frontal, central and occipital EEG, electrooculogram (EOG), submentalis EMG (chin), nasal and oral airflow, thoracic and abdominal wall motion, anterior tibialis EMG, snore microphone, electrocardiogram, and pulse oximetry.  SLEEP ARCHITECTURE The study was initiated at 9:57:19 PM and ended at 4:21:12 AM.  Sleep onset time was 46.8 minutes and the sleep efficiency was 72.2%. The total sleep time was 277.1 minutes.  Stage REM latency was N/A minutes.  The patient spent 4.69% of the night in stage N1 sleep, 86.83% in stage N2 sleep, 8.48% in stage N3 and 0.00% in REM.  Alpha intrusion was absent.  Supine sleep was 0.00%.  RESPIRATORY PARAMETERS The overall apnea/hypopnea index (AHI) was 10.4 per hour. There were 1 total apneas, including 1 obstructive, 0 central and 0 mixed apneas. There were 47 hypopneas and 70 RERAs.  The AHI during Stage REM sleep was N/A per hour.  Supine sleep was not noted  The mean oxygen saturation was 92.41%. The minimum SpO2 during sleep was 87.00%.  Loud snoring was noted during this study.  CARDIAC DATA The 2 lead EKG demonstrated sinus rhythm. The mean heart rate was 71.25 beats per minute. Other EKG findings include: None.   LEG MOVEMENT DATA The total PLMS were 231 with a resulting PLMS index of 50.02. Associated arousal with leg movement index was  3.5 .  IMPRESSIONS - Mild obstructive sleep apnea occurred during this study (AHI = 10.4/h). Several RERAs noted with RDI 25/h - No significant central sleep apnea occurred during this study (CAI = 0.0/h). - Mild oxygen desaturation was noted during this study (Min O2 = 87.00%). - The patient snored with Loud snoring volume. - No cardiac abnormalities were noted during this study. - Severe periodic limb movements of sleep occurred during the study. No significant associated arousals.  DIAGNOSIS - Obstructive Sleep Apnea (327.23 [G47.33 ICD-10])   RECOMMENDATIONS - Therapeutic CPAP titration to determine optimal pressure required to alleviate sleep disordered breathing. - Avoid alcohol, sedatives and other CNS depressants that may worsen sleep apnea and disrupt normal sleep architecture. - Sleep hygiene should be reviewed to assess factors that may improve sleep quality. - Weight management and regular exercise should be initiated or continued if appropriate.   Kara Mead MD Board Certified in [place]

## 2017-05-27 ENCOUNTER — Encounter (HOSPITAL_BASED_OUTPATIENT_CLINIC_OR_DEPARTMENT_OTHER): Payer: Medicare Other

## 2017-05-27 ENCOUNTER — Telehealth: Payer: Self-pay | Admitting: Acute Care

## 2017-05-27 DIAGNOSIS — G4733 Obstructive sleep apnea (adult) (pediatric): Secondary | ICD-10-CM

## 2017-05-27 NOTE — Telephone Encounter (Signed)
Please place order for therapeutic CPAP titration per Dr. Philbert Riser result note of sleep study done 05/20/2017, see below.   RECOMMENDATIONS - Therapeutic CPAP titration to determine optimal pressure  required to alleviate sleep disordered breathing. - Avoid alcohol, sedatives and other CNS depressants that may  worsen sleep apnea and disrupt normal sleep architecture. - Sleep hygiene should be reviewed to assess factors that may  improve sleep quality. - Weight management and regular exercise should be initiated or  continued if appropriate.  Thanks so much, Judson Roch

## 2017-05-28 NOTE — Telephone Encounter (Signed)
Patient calling for sleep study results - she can be reached at [phone] -pr

## 2017-05-28 NOTE — Telephone Encounter (Signed)
Spoke with pt, aware of results/recs.  Cpap titration study ordered. Nothing further needed.

## 2017-05-30 ENCOUNTER — Telehealth: Payer: Self-pay

## 2017-05-30 NOTE — Telephone Encounter (Signed)
Error

## 2017-05-30 NOTE — Telephone Encounter (Signed)
-----   Message from Rigoberto Noel, MD sent at 05/20/2017  9:20 AM EDT ----- OV this week to discuss sleep study results

## 2017-06-03 ENCOUNTER — Telehealth: Payer: Self-pay | Admitting: Pulmonary Disease

## 2017-06-03 NOTE — Telephone Encounter (Signed)
I have spoken with Sheryl Curtis. Sheryl Curtis states she received a voicemail from our office, but she was unable to catch the name. Per our records we last contacted Sheryl Curtis on 05/28/17 in regards to titration study. Sheryl Curtis aware that may have been an old message and that other further is needed on our end.

## 2017-06-05 ENCOUNTER — Ambulatory Visit (INDEPENDENT_AMBULATORY_CARE_PROVIDER_SITE_OTHER): Payer: Medicare Other | Admitting: Physician Assistant

## 2017-06-05 ENCOUNTER — Ambulatory Visit (HOSPITAL_BASED_OUTPATIENT_CLINIC_OR_DEPARTMENT_OTHER): Payer: Medicare Other | Attending: Pulmonary Disease | Admitting: Pulmonary Disease

## 2017-06-05 VITALS — Ht 64.0 in | Wt 190.0 lb

## 2017-06-05 DIAGNOSIS — G4761 Periodic limb movement disorder: Secondary | ICD-10-CM | POA: Insufficient documentation

## 2017-06-05 DIAGNOSIS — G4733 Obstructive sleep apnea (adult) (pediatric): Secondary | ICD-10-CM | POA: Diagnosis not present

## 2017-06-05 DIAGNOSIS — I493 Ventricular premature depolarization: Secondary | ICD-10-CM | POA: Diagnosis not present

## 2017-06-06 ENCOUNTER — Telehealth: Payer: Self-pay | Admitting: Pulmonary Disease

## 2017-06-06 ENCOUNTER — Other Ambulatory Visit (HOSPITAL_BASED_OUTPATIENT_CLINIC_OR_DEPARTMENT_OTHER): Payer: Self-pay

## 2017-06-06 DIAGNOSIS — G4733 Obstructive sleep apnea (adult) (pediatric): Secondary | ICD-10-CM

## 2017-06-06 NOTE — Telephone Encounter (Signed)
Send Rx to DME for  CPAP therapy on 10 cm H2O with a Medium size [employer] Full Face Mask Simplus mask and heated humidification.  OV in 6wks with NP/ me

## 2017-06-06 NOTE — Procedures (Signed)
Patient Name: Sheryl Curtis, Tech Date: 06/05/2017 Gender: Female D.O.B: 1932-02-13 Age (years): 55 Referring Provider: Kara Mead MD, ABSM Height (inches): 64 Interpreting Physician: Kara Mead MD, ABSM Weight (lbs): 190 RPSGT: Earney Hamburg BMI: 33 MRN: [id number] Neck Size: 16.00   CLINICAL INFORMATION The patient is referred for a CPAP titration to treat sleep apnea.  Date of NPSG: 05/13/17 , AHI 11/h, RDI 21/h  SLEEP STUDY TECHNIQUE As per the AASM Manual for the Scoring of Sleep and Associated Events v2.3 (April 2016) with a hypopnea requiring 4% desaturations.  The channels recorded and monitored were frontal, central and occipital EEG, electrooculogram (EOG), submentalis EMG (chin), nasal and oral airflow, thoracic and abdominal wall motion, anterior tibialis EMG, snore microphone, electrocardiogram, and pulse oximetry. Continuous positive airway pressure (CPAP) was initiated at the beginning of the study and titrated to treat sleep-disordered breathing.  RESPIRATORY PARAMETERS Optimal PAP Pressure (cm): 10 AHI at Optimal Pressure (/hr): 0.0 Overall Minimal O2 (%): 0.00 Supine % at Optimal Pressure (%): 100 Minimal O2 at Optimal Pressure (%): 91.0     SLEEP ARCHITECTURE The study was initiated at 10:28:53 PM and ended at 4:42:23 AM.  Sleep onset time was 63.4 minutes and the sleep efficiency was 54.0%. The total sleep time was 201.6 minutes.  The patient spent 5.21% of the night in stage N1 sleep, 87.35% in stage N2 sleep, 0.00% in stage N3 and 7.44% in REM.Stage REM latency was 178.0 minutes  Wake after sleep onset was 108.5. Alpha intrusion was absent. Supine sleep was 14.93%.  CARDIAC DATA The 2 lead EKG demonstrated sinus rhythm. The mean heart rate was 72.63 beats per minute. Other EKG findings include: PVCs.   LEG MOVEMENT DATA The total Periodic Limb Movements of Sleep (PLMS) were 533. The PLMS index was 158.64. A PLMS index of <15 is considered normal  in adults.  IMPRESSIONS - The optimal PAP pressure was 10 cm of water. - Central sleep apnea was not noted during this titration (CAI = 0.0/h). - Severe oxygen desaturations were observed during this titration (min O2 = 0.00%). - The patient snored with Soft snoring volume during this titration study. - 2-lead EKG demonstrated: PVCs - Severe periodic limb movements were observed during this study as also baseline study. Arousals associated with PLMs were significant.   DIAGNOSIS - Obstructive Sleep Apnea (327.23 [G47.33 ICD-10])   RECOMMENDATIONS - Trial of CPAP therapy on 10 cm H2O with a Medium size Fisher&Paykel Full Face Mask Simplus mask and heated humidification. - Avoid alcohol, sedatives and other CNS depressants that may worsen sleep apnea and disrupt normal sleep architecture. - Sleep hygiene should be reviewed to assess factors that may improve sleep quality. - Weight management and regular exercise should be initiated or continued. - Return to Sleep Center for re-evaluation after 4 weeks of therapy - Corelate with clinical history of restless legs syndrome   Kara Mead MD Board Certified in [place]

## 2017-06-11 ENCOUNTER — Telehealth: Payer: Self-pay | Admitting: Pulmonary Disease

## 2017-06-11 NOTE — Telephone Encounter (Signed)
Left message for patient to call back. I will place the order once the patient is aware of results.

## 2017-06-11 NOTE — Telephone Encounter (Signed)
Spoke with patient. She is aware of the CPAP order. Wishes to have this sent to [facility] since she is currently getting oxygen from them. Advised her to give our office a call in a week if she hasn't heard anything from [facility].

## 2017-06-11 NOTE — Telephone Encounter (Signed)
Patient is returning phone call.  °

## 2017-06-11 NOTE — Telephone Encounter (Signed)
I have already spoken with the patient regarding her CPAP machine. Nothing else was needed at time of call.

## 2017-07-01 ENCOUNTER — Telehealth (INDEPENDENT_AMBULATORY_CARE_PROVIDER_SITE_OTHER): Payer: Self-pay | Admitting: Physical Medicine and Rehabilitation

## 2017-07-01 ENCOUNTER — Encounter (HOSPITAL_BASED_OUTPATIENT_CLINIC_OR_DEPARTMENT_OTHER): Payer: Medicare Other

## 2017-07-01 NOTE — Telephone Encounter (Signed)
Scheduled for 9/17 at 1445.

## 2017-07-01 NOTE — Telephone Encounter (Signed)
Yes versus medial branch block, same code same reason

## 2017-07-07 ENCOUNTER — Ambulatory Visit: Payer: Medicare Other | Admitting: Pulmonary Disease

## 2017-07-14 ENCOUNTER — Ambulatory Visit (INDEPENDENT_AMBULATORY_CARE_PROVIDER_SITE_OTHER): Payer: Self-pay

## 2017-07-14 ENCOUNTER — Ambulatory Visit (INDEPENDENT_AMBULATORY_CARE_PROVIDER_SITE_OTHER): Payer: Medicare Other | Admitting: Physical Medicine and Rehabilitation

## 2017-07-14 ENCOUNTER — Encounter (INDEPENDENT_AMBULATORY_CARE_PROVIDER_SITE_OTHER): Payer: Self-pay | Admitting: Physical Medicine and Rehabilitation

## 2017-07-14 VITALS — BP 143/65 | HR 78 | Temp 98.0°F

## 2017-07-14 DIAGNOSIS — M5416 Radiculopathy, lumbar region: Secondary | ICD-10-CM

## 2017-07-14 MED ORDER — LIDOCAINE HCL (PF) 1 % IJ SOLN
2.0000 mL | Freq: Once | INTRAMUSCULAR | Status: AC
  Administered 2017-07-14: 2 mL

## 2017-07-14 MED ORDER — BETAMETHASONE SOD PHOS & ACET 6 (3-3) MG/ML IJ SUSP
12.0000 mg | Freq: Once | INTRAMUSCULAR | Status: AC
  Administered 2017-07-14: 12 mg

## 2017-07-14 NOTE — Progress Notes (Deleted)
Patient states she had more than 50% relief with last injection for 3 weeks. Worse on left side. Denies numbness and tingling. Pain down the side of right leg to foot.

## 2017-07-14 NOTE — Patient Instructions (Signed)

## 2017-07-18 ENCOUNTER — Ambulatory Visit: Payer: Medicare Other | Admitting: Pulmonary Disease

## 2017-07-18 NOTE — Procedures (Signed)
Mrs. Watson is an 83 year old female with chronic worsening severe mostly axial low back pain little bit more left than right but with right pain in the hip and thigh. Intra-articular injections gave her more than 50% relief for 3 weeks. She is now having mostly this right hip and leg pain and back pain. I think it would be wise to try a right L4 transforaminal epidural steroid injection due to the fact she has pretty significant foraminal stenosis and this does correspond to the hip and leg pain which is really her biggest complaint. She has no paresthesias no. She has degenerative disc height loss throughout. She may be someone that would benefit more from general epidural injection from time to time. If we can isolate really wants bother I think this the best choice.The injection  will be diagnostic and hopefully therapeutic. The patient has failed conservative care including time, medications and activity modification.  Lumbosacral Transforaminal Epidural Steroid Injection - Sub-Pedicular Approach with Fluoroscopic Guidance  Patient: GENEIEVE DUELL      Date of Birth: [birth year] MRN: [id number] PCP: Alroy Dust, L.Marlou Sa, MD      Visit Date: 07/14/2017   Universal Protocol:    Date/Time: 07/14/2017  Consent Given By: the patient  Position: PRONE  Additional Comments: Vital signs were monitored before and after the procedure. Patient was prepped and draped in the usual sterile fashion. The correct patient, procedure, and site was verified.   Injection Procedure Details:  Procedure Site One Meds Administered:  Meds ordered this encounter  Medications  . lidocaine (PF) (XYLOCAINE) 1 % injection 2 mL  . betamethasone acetate-betamethasone sodium phosphate (CELESTONE) injection 12 mg    Laterality: Right  Location/Site:  L4-L5  Needle size: 22 G  Needle type: Spinal  Needle Placement: Transforaminal  Findings:  -Contrast Used: 1 mL iohexol 180 mg iodine/mL   -Comments: Excellent  flow of contrast along the nerve and into the epidural space. Initial flow contrast was intra-articular but with change of placement she did get good flow along the nerve root and epidural space.  Procedure Details: After squaring off the end-plates to get a true AP view, the C-arm was positioned so that an oblique view of the foramen as noted above was visualized. The target area is just inferior to the "nose of the scotty dog" or sub pedicular. The soft tissues overlying this structure were infiltrated with 2-3 ml. of 1% Lidocaine without Epinephrine.  The spinal needle was inserted toward the target using a "trajectory" view along the fluoroscope beam.  Under AP and lateral visualization, the needle was advanced so it did not puncture dura and was located close the 6 O'Clock position of the pedical in AP tracterory. Biplanar projections were used to confirm position. Aspiration was confirmed to be negative for CSF and/or blood. A 1-2 ml. volume of Isovue-250 was injected and flow of contrast was noted at each level. Radiographs were obtained for documentation purposes.   After attaining the desired flow of contrast documented above, a 0.5 to 1.0 ml test dose of 0.25% Marcaine was injected into each respective transforaminal space.  The patient was observed for 90 seconds post injection.  After no sensory deficits were reported, and normal lower extremity motor function was noted,   the above injectate was administered so that equal amounts of the injectate were placed at each foramen (level) into the transforaminal epidural space.   Additional Comments:  The patient tolerated the procedure well Dressing: Band-Aid    Post-procedure details:  Patient was observed during the procedure. Post-procedure instructions were reviewed.  Patient left the clinic in stable condition.

## 2017-07-22 ENCOUNTER — Telehealth: Payer: Self-pay | Admitting: Pulmonary Disease

## 2017-07-22 NOTE — Telephone Encounter (Signed)
ok 

## 2017-07-22 NOTE — Telephone Encounter (Signed)
Spoke with Sheryl Curtis, she states she has gotten to the point where she cannot breathe. She is using her nebulizer and all her other medications. She states when she moves she gets completely out of breath. I made an appt with Brandi for tomorrow. RZ FYI

## 2017-07-23 ENCOUNTER — Ambulatory Visit (INDEPENDENT_AMBULATORY_CARE_PROVIDER_SITE_OTHER)
Admission: RE | Admit: 2017-07-23 | Discharge: 2017-07-23 | Disposition: A | Discharge status: Home / Self Care | Payer: Medicare Other | Source: Ambulatory Visit | Attending: Pulmonary Disease | Admitting: Pulmonary Disease

## 2017-07-23 ENCOUNTER — Ambulatory Visit (INDEPENDENT_AMBULATORY_CARE_PROVIDER_SITE_OTHER): Payer: Medicare Other | Admitting: Pulmonary Disease

## 2017-07-23 ENCOUNTER — Other Ambulatory Visit: Payer: Self-pay | Admitting: Pulmonary Disease

## 2017-07-23 ENCOUNTER — Encounter: Payer: Self-pay | Admitting: Pulmonary Disease

## 2017-07-23 VITALS — BP 148/60 | HR 85 | Ht 64.5 in | Wt 207.8 lb

## 2017-07-23 DIAGNOSIS — R0602 Shortness of breath: Secondary | ICD-10-CM | POA: Diagnosis not present

## 2017-07-23 DIAGNOSIS — G4733 Obstructive sleep apnea (adult) (pediatric): Secondary | ICD-10-CM

## 2017-07-23 MED ORDER — ALBUTEROL SULFATE HFA 108 (90 BASE) MCG/ACT IN AERS: 2.0000 | INHALATION_SPRAY | Freq: Four times a day (QID) | RESPIRATORY_TRACT | 6 refills | Status: DC | PRN

## 2017-07-23 MED ORDER — FUROSEMIDE 20 MG PO TABS: 20.0000 mg | ORAL_TABLET | Freq: Every day | ORAL | 5 refills | Status: DC

## 2017-07-23 MED ORDER — POTASSIUM CHLORIDE ER 10 MEQ PO TBCR: 10.0000 meq | EXTENDED_RELEASE_TABLET | Freq: Every day | ORAL | 0 refills | Status: DC

## 2017-07-23 NOTE — Progress Notes (Addendum)
[facility]    Chief Complaint  Patient presents with  . Acute Visit    SOB for the last week, with exertion, on symbicort twice daily, does not use the rescue inhaler     Current Outpatient Prescriptions on File Prior to Visit  Medication Sig  . albuterol (PROVENTIL) (2.5 MG/3ML) 0.083% nebulizer solution 1 vial in neb every 6 hours and as needed Dx J45.909  . Albuterol Sulfate (PROAIR RESPICLICK) 269 (90 Base) MCG/ACT AEPB Inhale 1-2 puffs into the lungs every 6 (six) hours as needed (for shortness of breath and/or wheezing.).  Marland Kitchen ALPRAZolam (XANAX) 0.25 MG tablet Take 0.25 mg by mouth at bedtime as needed for sleep.   . Ascorbic Acid (VITAMIN C) 1000 MG tablet Take 1,000 mg by mouth daily.  . budesonide-formoterol (SYMBICORT) 160-4.5 MCG/ACT inhaler Inhale 2 puffs into the lungs 2 (two) times daily.  . budesonide-formoterol (SYMBICORT) 160-4.5 MCG/ACT inhaler Inhale 2 puffs into the lungs 2 (two) times daily.  . budesonide-formoterol (SYMBICORT) 160-4.5 MCG/ACT inhaler Inhale 2 puffs into the lungs 2 (two) times daily.  Marland Kitchen CALCIUM PO Take 1,200 mg by mouth daily.   . clopidogrel (PLAVIX) 75 MG tablet TAKE 1 TABLET(75 MG) BY MOUTH EVERY OTHER DAY  . diclofenac sodium (VOLTAREN) 1 % GEL APP THIN LAYER EXTERNALLY TO AFFECT AREA(S) UP TO FOUR TIMES DAILY AS NEEDED FOR PAIN  . HYDROcodone-acetaminophen (NORCO) 7.5-325 MG tablet Take 1 tablet by mouth every 6 (six) hours as needed for moderate pain.  . isosorbide mononitrate (IMDUR) 30 MG 24 hr tablet TAKE 1 TABLET(30 MG) BY MOUTH DAILY  . meloxicam (MOBIC) 7.5 MG tablet Take 7.5 mg by mouth 2 (two) times daily as needed for pain (and inflammation).   . Nebulizers (COMPRESSOR NEBULIZER) MISC 1 Device by Does not apply route once.  . nitroGLYCERIN (NITROSTAT) 0.4 MG SL tablet Place 1 tablet (0.4 mg total) under the tongue every 5 (five) minutes as needed for chest pain.  . pantoprazole (PROTONIX) 20 MG tablet TAKE 1 TABLET(20 MG) BY MOUTH  EVERY EVENING  . valsartan-hydrochlorothiazide (DIOVAN-HCT) 160-25 MG tablet Take 1 tablet by mouth daily.   No current facility-administered medications on file prior to visit.      Past Medical Hx:  has a past medical history of ALLERGIC RHINITIS; Atrial fibrillation ([place]); CANCER, COLORECTAL; CHEST PAIN, ATYPICAL; DYSPNEA; Essential hypertension, benign; FIBROMYALGIA; G E R D; History of stress test; INSOMNIA; and Other and unspecified angina pectoris.   Past Surgical hx, Allergies, Family hx, Social hx all reviewed.  Vital Signs BP (!) 148/60 (BP Location: Left Arm, Cuff Size: Normal)   Pulse 85   Ht 5' 4.5" (1.638 m)   Wt 207 lb 12.8 oz (94.3 kg)   SpO2 98%   BMI 35.12 kg/m   History of Present Illness Sheryl Curtis is a 83 y.o. female, former smoker (quit 1963, 13 years 1ppd), mild persistent asthma with a history of RA, inumerable pulmonary nodules (stable on CT 03/2017) who presented to the pulmonary office for evaluation of shortness of breath.    Patient reports she's had increased shortness of breath over the last week. She denies use of her rescue inhaler. She states her inhaler is quite old and she probably needs a new one. She has noted that she has been intermittently swelling in her lower extremities have a dry hacking cough. She has gained 4 pounds in the last month despite cutting down on her intake. She has taken Xanax with improvement in  her shortness of breath. She reports occasional palpitations.  She states that she is congested in the morning with clear secretions but denies fevers, chills, nausea, vomiting, diarrhea.  She states she has been wearing her CPAP.    She reports over the past year she has had an increase in her back pain and has been receiving injections for pain.  She has been less mobile and has gained weight (see below).  She states she has terrible back pain / sciatic nerve issues.    PCP-Dr. Donnie Coffin >> [facility]    Physical  Exam  General - well developed adult F in no acute distress ENT - No sinus tenderness, no oral exudate, no LAN Cardiac - s1s2 regular, no murmur Chest - even/non-labored, lungs bilaterally diminished. No wheeze/rales Back - No focal tenderness Abd - Soft, non-tender Ext - 1+ pitting BLE edema Neuro - Normal strength Skin - No rashes Psych - normal mood, and behavior  Studies: ECHO 07/03/2015 >> LVEF 62-70%, normal systolic function, no RWMA, mild MR, LA mildly dilated, RV normal size, normal pulmonary artery size/systolic pressure  CT Chest 04/07/17 >>  Greater than 2 year stability of innumerable subcentimeter pulmonary nodules scattered throughout both lungs, generally centrilobular in distribution, compatible with benign postinflammatory nodularity. No active pulmonary disease . Left main and 1 vessel coronary atherosclerosis  PSG 06/05/17 >>  The optimal PAP pressure was 10 cm of water. - Central sleep apnea was not noted during this titration (CAI = 0.0/h). - Severe oxygen desaturations were observed during this titration (min O2 = 0.00%). - The patient snored with Soft snoring volume during this titration study. - 2-lead EKG demonstrated: PVCs - Severe periodic limb movements were observed during this study as also baseline study. Arousals associated with PLMs were significant.  Assessment/Plan: Primary Pulmonary - Dr. Elsworth Soho   Discussion: 83 y/o F with PMH of AF, RA, mild persistent asthma, OSA on CPAP (10cm) + nocturnal hypoxemia seen in office for increased SOB.  The patient reports increased LE swelling, 4lb weight gain (despite reduction of oral intake) and dry hacking cough. She has been less mobile over the last year due to back pain and has gained 31lbs.  She is on HCTZ at baseline.  No significant crackles on exam.  Also, may be component of deconditioning & weight gain (176 in 09/2016, now 207 in office).  Endorses CPAP use.   Dyspnea - likely multifactorial, recent CT in  03/2017 stable nodules, suspect weight gain has contributed (up 31 lbs since 09/2016).  She has not been using her rescue inhaler as prescribed.  Note at last NP office visit she was given an Rx for new inhaler but she states the one she has is "very old".   Plan: Assess CXR Rx given for lasix >instructed patient that I would call her after film review.  CXR does not show any evidence of edema or effusions.  Pt called (5:47PM) and instructed not to fill the prescription.   Encouraged exercise as able  Elevate LE's, wear compression stockings  Pt instructed to use rescue inhaler & xanax as needed  Appt made for follow up with Dr. Elsworth Soho next week > pt to cancel if she is feeling well.  She indicates understanding of plan  Mild Persistent Asthma  Plan: Continue symbicort  Encouraged patient to use PRN albuterol for SOB  OSA  Nocturnal Hypoxemia  Plan: Continue CPAP, nocturnal O2 Assess download at next visit with Dr. Elsworth Soho   Anxiety -  likely contributing to SOB as symptoms improved with xanax Plan: Continue PRN xanax   Patient Instructions  1.  We will evaluate a chest x-ray today. I suspect her shortness of breath is related to fluid accumulation.  2.  I will send you a prescription for Lasix > please do not fill this until you hear from me regarding her chest x-ray.  3. Use albuterol every 6 hours as needed for shortness of breath  4.  Please return to see Dr. Elsworth Soho or an NP next week to evaluate symptoms  5.  Call if you have new or worsening symptoms.     Noe Gens, NP-C [facility]  [phone] 07/23/2017, 4:12 PM

## 2017-07-23 NOTE — Progress Notes (Signed)
[facility]    Chief Complaint  Patient presents with  . Acute Visit    SOB for the last week, with exertion, on symbicort twice daily, does not use the rescue inhaler     Current Outpatient Prescriptions on File Prior to Visit  Medication Sig  . albuterol (PROVENTIL) (2.5 MG/3ML) 0.083% nebulizer solution 1 vial in neb every 6 hours and as needed Dx J45.909  . Albuterol Sulfate (PROAIR RESPICLICK) 025 (90 Base) MCG/ACT AEPB Inhale 1-2 puffs into the lungs every 6 (six) hours as needed (for shortness of breath and/or wheezing.).  Marland Kitchen ALPRAZolam (XANAX) 0.25 MG tablet Take 0.25 mg by mouth at bedtime as needed for sleep.   . Ascorbic Acid (VITAMIN C) 1000 MG tablet Take 1,000 mg by mouth daily.  . budesonide-formoterol (SYMBICORT) 160-4.5 MCG/ACT inhaler Inhale 2 puffs into the lungs 2 (two) times daily.  . budesonide-formoterol (SYMBICORT) 160-4.5 MCG/ACT inhaler Inhale 2 puffs into the lungs 2 (two) times daily.  . budesonide-formoterol (SYMBICORT) 160-4.5 MCG/ACT inhaler Inhale 2 puffs into the lungs 2 (two) times daily.  Marland Kitchen CALCIUM PO Take 1,200 mg by mouth daily.   . clopidogrel (PLAVIX) 75 MG tablet TAKE 1 TABLET(75 MG) BY MOUTH EVERY OTHER DAY  . diclofenac sodium (VOLTAREN) 1 % GEL APP THIN LAYER EXTERNALLY TO AFFECT AREA(S) UP TO FOUR TIMES DAILY AS NEEDED FOR PAIN  . HYDROcodone-acetaminophen (NORCO) 7.5-325 MG tablet Take 1 tablet by mouth every 6 (six) hours as needed for moderate pain.  . isosorbide mononitrate (IMDUR) 30 MG 24 hr tablet TAKE 1 TABLET(30 MG) BY MOUTH DAILY  . meloxicam (MOBIC) 7.5 MG tablet Take 7.5 mg by mouth 2 (two) times daily as needed for pain (and inflammation).   . Nebulizers (COMPRESSOR NEBULIZER) MISC 1 Device by Does not apply route once.  . nitroGLYCERIN (NITROSTAT) 0.4 MG SL tablet Place 1 tablet (0.4 mg total) under the tongue every 5 (five) minutes as needed for chest pain.  . pantoprazole (PROTONIX) 20 MG tablet TAKE 1 TABLET(20 MG) BY MOUTH  EVERY EVENING  . valsartan-hydrochlorothiazide (DIOVAN-HCT) 160-25 MG tablet Take 1 tablet by mouth daily.   No current facility-administered medications on file prior to visit.      Past Medical Hx:  has a past medical history of ALLERGIC RHINITIS; Atrial fibrillation ([place]); CANCER, COLORECTAL; CHEST PAIN, ATYPICAL; DYSPNEA; Essential hypertension, benign; FIBROMYALGIA; G E R D; History of stress test; INSOMNIA; and Other and unspecified angina pectoris.   Past Surgical hx, Allergies, Family hx, Social hx all reviewed.  Vital Signs BP (!) 148/60 (BP Location: Left Arm, Cuff Size: Normal)   Pulse 85   Ht 5' 4.5" (1.638 m)   Wt 207 lb 12.8 oz (94.3 kg)   SpO2 98%   BMI 35.12 kg/m   History of Present Illness Sheryl Curtis is a 83 y.o. female, former smoker (quit 1963, 13 years 1ppd), mild persistent asthma with a history of RA, inumerable pulmonary nodules (stable on CT 03/2017) who presented to the pulmonary office for evaluation of shortness of breath.    Patient reports she's had increased shortness of breath over the last week. She denies use of her rescue inhaler. She states her inhaler is quite old and she probably needs a new one. She has noted that she has been intermittently swelling in her lower extremities have a dry hacking cough. She has gained 4 pounds in the last month despite cutting down on her intake. She has taken Xanax with improvement in  her shortness of breath. She reports palpitations.  She states that she is congested in the morning but denies fevers, chills, nausea, vomiting, diarrhea.  She states she has been wearing her CPAP.    PCP-Dr. Donnie Coffin >> [facility]    Physical Exam  General - well developed adult F in no acute distress ENT - No sinus tenderness, no oral exudate, no LAN Cardiac - s1s2 regular, no murmur Chest - even/non-labored, lungs bilaterally diminished. No wheeze/rales Back - No focal tenderness Abd - Soft, non-tender Ext - 1+  pitting BLE edema Neuro - Normal strength Skin - No rashes Psych - normal mood, and behavior  Studies: CT Chest 04/07/17 >>  Greater than 2 year stability of innumerable subcentimeter pulmonary nodules scattered throughout both lungs, generally centrilobular in distribution, compatible with benign postinflammatory nodularity. No active pulmonary disease . Left main and 1 vessel coronary atherosclerosis  PSG 06/05/17 >>  The optimal PAP pressure was 10 cm of water. - Central sleep apnea was not noted during this titration (CAI = 0.0/h). - Severe oxygen desaturations were observed during this titration (min O2 = 0.00%). - The patient snored with Soft snoring volume during this titration study. - 2-lead EKG demonstrated: PVCs - Severe periodic limb movements were observed during this study as also baseline study. Arousals associated with PLMs were significant.  Assessment/Plan: Primary Pulmonary - Dr. Elsworth Soho   Discussion: 83 y/o F with PMH of AF, RA, mild persistent asthma, OSA on CPAP (10cm) + nocturnal hypoxemia seen in office for increased SOB.  The patient reports increased LE swelling, 4lb weight gain (despite reduction of oral intake) and dry hacking cough. She is on HCTZ at baseline.  No significant crackles on exam but concerned for component of fluid excess.  Also, may be component of deconditioning & weight gain (176 in 09/2016, now 207 in office).  Endorses CPAP use.   Dyspnea - recent CT in 03/2017 stable nodules, suspect weight gain has contributed (up 31 lbs since 09/2016), she has had intermittent swelling and palpitations > ? If she has had PAF with diastolic dysfunction. She has not been using her rescue inhaler as prescribed.  Note at last NP office visit she was given an Rx for new inhaler but she states the one she has is "very old".   Plan: Assess CXR Rx given for lasix > will review film and call patient with instructions if she should fill  Will need f/u appt next week with  labs if patient needs lasix  Mild Persistent Asthma  Plan: Continue symbicort  Encouraged patient to use PRN albuterol for SOB  OSA  Nocturnal Hypoxemia  Plan:   Patient Instructions  1.  We will evaluate a chest x-ray today. I suspect her shortness of breath is related to fluid accumulation.  2.  I will send you a prescription for Lasix > please do not fill this until you hear from me regarding her chest x-ray.  3. Use albuterol every 6 hours as needed for shortness of breath  4.  Please return to see Dr. Elsworth Soho or an NP next week to evaluate symptoms  5.  Call if you have new or worsening symptoms.     Noe Gens, NP-C [facility]  [phone] 07/23/2017, 4:05 PM

## 2017-07-23 NOTE — Patient Instructions (Addendum)
1.  We will evaluate a chest x-ray today. I suspect her shortness of breath is related to fluid accumulation.  2.  I will send you a prescription for Lasix > please do not fill this until you hear from me regarding her chest x-ray.  3. Use albuterol every 6 hours as needed for shortness of breath  4.  Please return to see Dr. Elsworth Soho or an NP next week to evaluate symptoms  5.  Call if you have new or worsening symptoms.

## 2017-07-30 ENCOUNTER — Ambulatory Visit (INDEPENDENT_AMBULATORY_CARE_PROVIDER_SITE_OTHER): Payer: Medicare Other | Admitting: Pulmonary Disease

## 2017-07-30 ENCOUNTER — Encounter: Payer: Self-pay | Admitting: Pulmonary Disease

## 2017-07-30 DIAGNOSIS — G4733 Obstructive sleep apnea (adult) (pediatric): Secondary | ICD-10-CM

## 2017-07-30 DIAGNOSIS — J4531 Mild persistent asthma with (acute) exacerbation: Secondary | ICD-10-CM | POA: Diagnosis not present

## 2017-07-30 MED ORDER — PREDNISONE 10 MG PO TABS: ORAL_TABLET | ORAL | 0 refills | Status: DC

## 2017-07-30 NOTE — Assessment & Plan Note (Signed)
We'll treat her as an asthma exacerbation given the mild bronchospasm on exam. Prednisone 10 mg tabs  Take 2 tabs daily with food x 5ds, then 1 tab daily with food x 5ds then STOP  Continue Symbicort and use albuterol as needed

## 2017-07-30 NOTE — Progress Notes (Signed)
   Subjective:    Patient ID: Sheryl Curtis, female    DOB: July 06, 1932, 83 y.o.   MRN: [id number]  HPI  83 year-old remote smoker with severe RA,for FU of DOE She has severe rheumatoid arthritis with previous MTX use.   She has prior history of rectal cancer status post resection in 2007 She underwent stent to LAD in 04/2013.   She was seen by NP last week For increased shortness of breath, felt to have fluid overload, given Lasix and asked to follow-up in a week. She continues to have shortness of breath and chest tightness.  Chest x-ray 9/26 shows prominent interstitial markings but no effusions, hence she did not fill or take Lasix   She reports bipedal edema and is worse towards evenings. She reports chronic dry cough that sometimes wakes herup from sleep  She reports intermittent wheezing for which she takes albuterol  Last flare up of 04/2016   she was provided with a Cpap machine  About a month ago and she reports that this is helping her,  Daytime somnolence and fatiue iiproved, she is able to tolerate nassdnes any problems with mask or  Pressure CPAP download on 10 cm shows good control of events with minimal leak in excellent compliance      Significant tests/ events CT chest 03/2017 -stable multiple pulmonary nodules since 2015  Spirometry in 05/2010 showed FEV1 of 76% improvement postbronchodilator to 80%-1.60 with a ratio of 75 suggesting mild restriction. Spirometry 03/2014 FEV1 of 1.25-64%, with ratio 65 and FVC of 1.91-72%.  PFTs - 12/2014 - no airway obstruction, ratio 71, pos BD response 06/2015 Admit for UTI and Sepsis   NPSG >> AHI 10/h corrected by CPAP 10 cm  Echo >> normal LV function without any evidence of diastolic dysfunction.   Past Medical History:  Diagnosis Date  . ALLERGIC RHINITIS   . Atrial fibrillation ([place])   . CANCER, COLORECTAL   . CHEST PAIN, ATYPICAL   . DYSPNEA   . Essential hypertension, benign   . FIBROMYALGIA   . G E R D    . History of stress test    Myoview 8/16:  EF 69%, anterior and apical defect c/w breast attenuation; Low Risk   . INSOMNIA   . Other and unspecified angina pectoris      Review of Systems neg for any significant sore throat, dysphagia, itching, sneezing, nasal congestion or excess/ purulent secretions, fever, chills, sweats, unintended wt loss, pleuritic or exertional cp, hempoptysis, orthopnea pnd or change in chronic leg swelling.   Also denies presyncope, palpitations, heartburn, abdominal pain, nausea, vomiting, diarrhea or change in bowel or urinary habits, dysuria,hematuria, rash, arthralgias, visual complaints, headache, numbness weakness or ataxia.     Objective:   Physical Exam  Gen. Pleasant, obese, in no distress, normal affect ENT - no lesions, no post nasal drip, class 2-3 airway Neck: No JVD, no thyromegaly, no carotid bruits Lungs: no use of accessory muscles, no dullness to percussion, Bl scattered rhonchi, no rales  Cardiovascular: Rhythm regular, heart sounds  normal, no murmurs or gallops, 1+ peripheral edema Abdomen: soft and non-tender, no hepatosplenomegaly, BS normal. Musculoskeletal: No deformities, no cyanosis or clubbing Neuro:  alert, non focal, no tremors       Assessment & Plan:

## 2017-07-30 NOTE — Patient Instructions (Addendum)
Lasix 20 mg daily for one week.   Prednisone 10 mg tabs  Take 2 tabs daily with food x 5ds, then 1 tab daily with food x 5ds then STOP   we will check report on CpAP machine

## 2017-07-30 NOTE — Assessment & Plan Note (Signed)
CPAP 10 cm seems to be effective and she is compliant and this has helped improve her symptomatically  Weight loss encouraged, compliance with goal of at least 4-6 hrs every night is the expectation. Advised against medications with sedative side effects Cautioned against driving when sleepy - understanding that sleepiness will vary on a day to day basis

## 2017-08-04 ENCOUNTER — Telehealth: Payer: Self-pay | Admitting: Pulmonary Disease

## 2017-08-04 MED ORDER — ALBUTEROL SULFATE (2.5 MG/3ML) 0.083% IN NEBU: INHALATION_SOLUTION | RESPIRATORY_TRACT | 11 refills | Status: DC

## 2017-08-04 NOTE — Telephone Encounter (Signed)
Sent in Rx to [facility]. Pt advised and nothing further is needed/

## 2017-08-22 ENCOUNTER — Encounter: Payer: Self-pay | Admitting: Pulmonary Disease

## 2017-09-02 ENCOUNTER — Ambulatory Visit: Payer: Medicare Other | Admitting: Adult Health

## 2017-09-02 ENCOUNTER — Encounter: Payer: Self-pay | Admitting: Adult Health

## 2017-09-02 DIAGNOSIS — G4733 Obstructive sleep apnea (adult) (pediatric): Secondary | ICD-10-CM

## 2017-09-02 DIAGNOSIS — J4530 Mild persistent asthma, uncomplicated: Secondary | ICD-10-CM | POA: Diagnosis not present

## 2017-09-02 MED ORDER — BUDESONIDE-FORMOTEROL FUMARATE 160-4.5 MCG/ACT IN AERO: 2.0000 | INHALATION_SPRAY | Freq: Two times a day (BID) | RESPIRATORY_TRACT | 6 refills | Status: DC

## 2017-09-02 NOTE — Patient Instructions (Signed)
Continue on Symbicort 2 puffs Twice daily  , rinse after use.  Continue on CPAP At bedtime   Order CPAP mask .  Follow up with Dr. Elsworth Soho  In 3-4 months and As needed

## 2017-09-02 NOTE — Assessment & Plan Note (Signed)
Well controlled on CPAP  Change mask for comfort   Plan  Cont on CPAP .

## 2017-09-02 NOTE — Assessment & Plan Note (Signed)
Recent flare now improving  Cont on Symbicort

## 2017-09-02 NOTE — Progress Notes (Signed)
@Patient  ID: Sheryl Curtis, female    DOB: 06-May-1932, 83 y.o.   MRN: [id number]  Chief Complaint  Patient presents with  . Follow-up    Asthma     Referring provider: Alroy Dust, L.Marlou Sa, MD  HPI: 83 year-old remote smoker with severe RA, followed for Asthma , Lung nodules and OSA  She has severe rheumatoid arthritis with previous MTX use.   She has prior history of rectal cancer status post resection in 2007 She underwent stent to LAD in 04/2013.    Significant tests/ events CT chest 03/2017 -stable multiple pulmonary nodules since 2015  Spirometry in 05/2010 showed FEV1 of 76% improvement postbronchodilator to 80%-1.60 with a ratio of 75 suggesting mild restriction. Spirometry 03/2014 FEV1 of 1.25-64%, with ratio 65 and FVC of 1.91-72%.  PFTs - 12/2014 - no airway obstruction, ratio 71, pos BD response 06/2015 Admit for UTI and Sepsis   NPSG >> AHI 10/h corrected by CPAP 10 cm  Echo >> normal LV function without any evidence of diastolic dysfunction.   Chest x-ray July 23, 2017 showed bronchitic changes  09/02/2017 Follow up ; Asthma and OSA  Patient returns for a one-month follow-up.  Last visit she was treated for an asthma exacerbation.  She was treated with prednisone taper.  Patient says she is feeling better. Has good and bad days . Wheezing has gotten better. She remains on Symbicort twice daily. Denies chest pain, orthopnea, edema or fever.   Patient has known mild sleep apnea.  She is on CPAP at bedtime.  Pt says she is wearing but the mask does not fit well.   Download shows excellent compliance with average usage around 9 hours.  Patient is on CPAP 10 cm H2O.  AHI 1.0.  Minimal leaks.    Allergies  Allergen Reactions  . Aspirin Anaphylaxis, Hives and Shortness Of Breath  . Hydroxyquinolines Hives  . Imipramine Hcl Other (See Comments)    UNKNOWN TO PATIENT  . Pamelor [Nortriptyline Hcl] Other (See Comments)    unknown  . Prednisone Shortness Of  Breath and Itching    anxiety  . Procardia [Nifedipine] Other (See Comments)    Unknown   . Tramadol Swelling  . Ace Inhibitors Other (See Comments)    cough  . Plaquenil [Hydroxychloroquine Sulfate] Rash    Immunization History  Administered Date(s) Administered  . Pneumococcal Conjugate-13 11/23/2013  . Pneumococcal Polysaccharide-23 08/27/2007  . Tdap 08/04/2008  . Zoster Recombinat (Shingrix) 02/19/2017    Past Medical History:  Diagnosis Date  . ALLERGIC RHINITIS   . Atrial fibrillation ([place])   . CANCER, COLORECTAL   . CHEST PAIN, ATYPICAL   . DYSPNEA   . Essential hypertension, benign   . FIBROMYALGIA   . G E R D   . History of stress test    Myoview 8/16:  EF 69%, anterior and apical defect c/w breast attenuation; Low Risk   . INSOMNIA   . Other and unspecified angina pectoris     Tobacco History: Social History   Tobacco Use  Smoking Status Former Smoker  . Packs/day: 1.00  . Years: 12.00  . Pack years: 12.00  . Types: Cigarettes  . Start date: 10/28/1949  . Last attempt to quit: 10/28/1962  . Years since quitting: 54.8  Smokeless Tobacco Never Used   Counseling given: Not Answered   Outpatient Encounter Medications as of 09/02/2017  Medication Sig  . albuterol (PROVENTIL HFA;VENTOLIN HFA) 108 (90 Base) MCG/ACT inhaler Inhale 2 puffs into  the lungs every 6 (six) hours as needed for wheezing or shortness of breath.  Marland Kitchen albuterol (PROVENTIL) (2.5 MG/3ML) 0.083% nebulizer solution 1 vial in neb every 6 hours and as needed Dx J45.909  . Albuterol Sulfate (PROAIR RESPICLICK) 009 (90 Base) MCG/ACT AEPB Inhale 1-2 puffs into the lungs every 6 (six) hours as needed (for shortness of breath and/or wheezing.).  Marland Kitchen ALPRAZolam (XANAX) 0.25 MG tablet Take 0.25 mg by mouth at bedtime as needed for sleep.   . Ascorbic Acid (VITAMIN C) 1000 MG tablet Take 1,000 mg by mouth daily.  . budesonide-formoterol (SYMBICORT) 160-4.5 MCG/ACT inhaler Inhale 2 puffs into the lungs 2  (two) times daily.  . budesonide-formoterol (SYMBICORT) 160-4.5 MCG/ACT inhaler Inhale 2 puffs into the lungs 2 (two) times daily.  . budesonide-formoterol (SYMBICORT) 160-4.5 MCG/ACT inhaler Inhale 2 puffs into the lungs 2 (two) times daily.  Marland Kitchen CALCIUM PO Take 1,200 mg by mouth daily.   . clopidogrel (PLAVIX) 75 MG tablet TAKE 1 TABLET(75 MG) BY MOUTH EVERY OTHER DAY  . diclofenac sodium (VOLTAREN) 1 % GEL APP THIN LAYER EXTERNALLY TO AFFECT AREA(S) UP TO FOUR TIMES DAILY AS NEEDED FOR PAIN  . furosemide (LASIX) 20 MG tablet Take 1 tablet (20 mg total) by mouth daily.  Marland Kitchen HYDROcodone-acetaminophen (NORCO) 7.5-325 MG tablet Take 1 tablet by mouth every 6 (six) hours as needed for moderate pain.  . isosorbide mononitrate (IMDUR) 30 MG 24 hr tablet TAKE 1 TABLET(30 MG) BY MOUTH DAILY  . meloxicam (MOBIC) 7.5 MG tablet Take 7.5 mg by mouth 2 (two) times daily as needed for pain (and inflammation).   . Nebulizers (COMPRESSOR NEBULIZER) MISC 1 Device by Does not apply route once.  . nitroGLYCERIN (NITROSTAT) 0.4 MG SL tablet Place 1 tablet (0.4 mg total) under the tongue every 5 (five) minutes as needed for chest pain.  . pantoprazole (PROTONIX) 20 MG tablet TAKE 1 TABLET(20 MG) BY MOUTH EVERY EVENING  . potassium chloride (K-DUR) 10 MEQ tablet Take 1 tablet (10 mEq total) by mouth daily.  . predniSONE (DELTASONE) 10 MG tablet Take 2 tabs daily for 5 days, then take 1 tab daily for 5 days until finished.  . valsartan-hydrochlorothiazide (DIOVAN-HCT) 160-25 MG tablet Take 1 tablet by mouth daily.   No facility-administered encounter medications on file as of 09/02/2017.      Review of Systems  Constitutional:   No  weight loss, night sweats,  Fevers, chills, fatigue, or  lassitude.  HEENT:   No headaches,  Difficulty swallowing,  Tooth/dental problems, or  Sore throat,                No sneezing, itching, ear ache, nasal congestion, post nasal drip,   CV:  No chest pain,  Orthopnea, PND, swelling  in lower extremities, anasarca, dizziness, palpitations, syncope.   GI  No heartburn, indigestion, abdominal pain, nausea, vomiting, diarrhea, change in bowel habits, loss of appetite, bloody stools.   Resp:   Skin: no rash or lesions.  GU: no dysuria, change in color of urine, no urgency or frequency.  No flank pain, no hematuria   MS_+joint pains    Physical Exam  BP 126/80 (BP Location: Left Arm, Cuff Size: Normal)   Pulse 68   Ht 5\' 4"  (1.626 m)   Wt 207 lb (93.9 kg)   SpO2 96%   BMI 35.53 kg/m   GEN: A/Ox3; pleasant , NAD, obese    HEENT:  [place]/AT,  EACs-clear, TMs-wnl, NOSE-clear, THROAT-clear, no lesions,  no postnasal drip or exudate noted. Class 2-3 MP airway   NECK:  Supple w/ fair ROM; no JVD; normal carotid impulses w/o bruits; no thyromegaly or nodules palpated; no lymphadenopathy.    RESP  Clear  P & A; w/o, wheezes/ rales/ or rhonchi. no accessory muscle use, no dullness to percussion  CARD:  RRR, no m/r/g, no peripheral edema, pulses intact, no cyanosis or clubbing.  GI:   Soft & nt; nml bowel sounds; no organomegaly or masses detected.   Musco: Warm bil, no deformities or joint swelling noted.   Neuro: alert, no focal deficits noted.    Skin: Warm, no lesions or rashes    Lab Results:     Imaging: No results found.   Assessment & Plan:   OSA (obstructive sleep apnea) Well controlled on CPAP  Change mask for comfort   Plan  Cont on CPAP .   Asthma Recent flare now improving  Cont on Symbicort      Tammy Parrett, NP 09/02/2017

## 2017-09-02 NOTE — Addendum Note (Signed)
Addended by: Georjean Mode on: 09/02/2017 02:33 PM   Modules accepted: Orders

## 2017-09-03 NOTE — Progress Notes (Signed)
Reviewed & agree with plan  

## 2017-09-22 ENCOUNTER — Telehealth (INDEPENDENT_AMBULATORY_CARE_PROVIDER_SITE_OTHER): Payer: Self-pay | Admitting: Physical Medicine and Rehabilitation

## 2017-09-22 NOTE — Telephone Encounter (Signed)
Left L5 TF esi 

## 2017-09-23 ENCOUNTER — Telehealth (INDEPENDENT_AMBULATORY_CARE_PROVIDER_SITE_OTHER): Payer: Self-pay | Admitting: Physical Medicine and Rehabilitation

## 2017-09-23 NOTE — Telephone Encounter (Signed)
Scheduled for 10/06/17 with driver. Will hold Plavix for 7 days.

## 2017-09-23 NOTE — Telephone Encounter (Signed)
OK to hold Plavix 5-7 days prior to procedure.

## 2017-09-23 NOTE — Telephone Encounter (Signed)
Received ok for patient to hold Plavix. Left message for patient to call back to schedule.

## 2017-09-23 NOTE — Telephone Encounter (Signed)
Patient is taking Plavix. Will send message to Dr. Hassell Done office to get ok for patient to hold plavix. Patient aware.

## 2017-10-06 ENCOUNTER — Encounter (INDEPENDENT_AMBULATORY_CARE_PROVIDER_SITE_OTHER): Payer: Medicare Other | Admitting: Physical Medicine and Rehabilitation

## 2017-10-24 NOTE — Progress Notes (Signed)
Cardiology Office Note   Date:  10/27/2017   ID:  Sheryl Curtis, DOB [birth year], MRN [id number]  PCP:  Aurea Graff.Marlou Sa, MD    No chief complaint on file.  CAD  Wt Readings from Last 3 Encounters:  10/27/17 208 lb (94.3 kg)  09/02/17 207 lb (93.9 kg)  07/30/17 207 lb (93.9 kg)       History of Present Illness: Sheryl Curtis is a 83 y.o. female    Who has had CAD and an LAD stent in 2014.   She had a negative monitor for palpitations in the past.    Denies :  Dizziness. Leg edema. Orthopnea. Palpitations. Paroxysmal nocturnal dyspnea.  Syncope.   She reports chronic DOE and weight gain since starting steroid injections for her back.   She has had occasional chest discomfort, not related to exertion.  Shortlived and relieved spontaneously typically. Walking is limited by joint pains.    No CP in the past month.       Past Medical History:  Diagnosis Date  . ALLERGIC RHINITIS   . Atrial fibrillation ([place])   . CANCER, COLORECTAL   . CHEST PAIN, ATYPICAL   . DYSPNEA   . Essential hypertension, benign   . FIBROMYALGIA   . G E R D   . History of stress test    Myoview 8/16:  EF 69%, anterior and apical defect c/w breast attenuation; Low Risk   . INSOMNIA   . Other and unspecified angina pectoris     Past Surgical History:  Procedure Laterality Date  . ABDOMINAL HYSTERECTOMY    . BACK SURGERY    . BREAST SURGERY    . COLON SURGERY    . CORONARY STENT PLACEMENT    . PERCUTANEOUS CORONARY STENT INTERVENTION (PCI-S) N/A 05/19/2013   Procedure: PERCUTANEOUS CORONARY STENT INTERVENTION (PCI-S);  Surgeon: Jettie Booze, MD;  Location: [facility] CATH LAB;  Service: Cardiovascular;  Laterality: N/A;     Current Outpatient Medications  Medication Sig Dispense Refill  . albuterol (PROVENTIL HFA;VENTOLIN HFA) 108 (90 Base) MCG/ACT inhaler Inhale 2 puffs into the lungs every 6 (six) hours as needed for wheezing or shortness of breath. 1 Inhaler 6  . albuterol  (PROVENTIL) (2.5 MG/3ML) 0.083% nebulizer solution 1 vial in neb every 6 hours and as needed Dx J45.909 360 mL 11  . Albuterol Sulfate (PROAIR RESPICLICK) 010 (90 Base) MCG/ACT AEPB Inhale 1-2 puffs into the lungs every 6 (six) hours as needed (for shortness of breath and/or wheezing.). 1 each 6  . ALPRAZolam (XANAX) 0.25 MG tablet Take 0.25 mg by mouth at bedtime as needed for sleep.     . Ascorbic Acid (VITAMIN C) 1000 MG tablet Take 1,000 mg by mouth daily.    . budesonide-formoterol (SYMBICORT) 160-4.5 MCG/ACT inhaler Inhale 2 puffs into the lungs 2 (two) times daily.    Marland Kitchen CALCIUM PO Take 1,200 mg by mouth daily.     . clopidogrel (PLAVIX) 75 MG tablet TAKE 1 TABLET(75 MG) BY MOUTH EVERY OTHER DAY 45 tablet 2  . diclofenac sodium (VOLTAREN) 1 % GEL APP THIN LAYER EXTERNALLY TO AFFECT AREA(S) UP TO FOUR TIMES DAILY AS NEEDED FOR PAIN  3  . furosemide (LASIX) 20 MG tablet Take 1 tablet (20 mg total) by mouth daily. 30 tablet 5  . HYDROcodone-acetaminophen (NORCO) 7.5-325 MG tablet Take 1 tablet by mouth every 6 (six) hours as needed for moderate pain.    . isosorbide mononitrate (IMDUR) 30  MG 24 hr tablet TAKE 1 TABLET(30 MG) BY MOUTH DAILY 30 tablet 8  . meloxicam (MOBIC) 7.5 MG tablet Take 7.5 mg by mouth 2 (two) times daily as needed for pain (and inflammation).     . Nebulizers (COMPRESSOR NEBULIZER) MISC 1 Device by Does not apply route once. 1 each 0  . nitroGLYCERIN (NITROSTAT) 0.4 MG SL tablet Place 1 tablet (0.4 mg total) under the tongue every 5 (five) minutes as needed for chest pain. 90 tablet 3  . pantoprazole (PROTONIX) 20 MG tablet TAKE 1 TABLET(20 MG) BY MOUTH EVERY EVENING 90 tablet 1  . potassium chloride (K-DUR) 10 MEQ tablet Take 1 tablet (10 mEq total) by mouth daily. 5 tablet 0  . predniSONE (DELTASONE) 10 MG tablet Take 2 tabs daily for 5 days, then take 1 tab daily for 5 days until finished. 15 tablet 0  . valsartan-hydrochlorothiazide (DIOVAN-HCT) 160-25 MG tablet Take 1  tablet by mouth daily.    Marland Kitchen amLODipine (NORVASC) 2.5 MG tablet Take 2.5 mg by mouth daily.    . DULoxetine (CYMBALTA) 60 MG capsule Take 60 mg by mouth daily.     No current facility-administered medications for this visit.     Allergies:   Aspirin; Hydroxyquinolines; Imipramine hcl; Pamelor [nortriptyline hcl]; Prednisone; Procardia [nifedipine]; Tramadol; Ace inhibitors; and Plaquenil [hydroxychloroquine sulfate]    Social History:  The patient  reports that she quit smoking about 55 years ago. Her smoking use included cigarettes. She started smoking about 68 years ago. She has a 12.00 pack-year smoking history. she has never used smokeless tobacco. She reports that she does not drink alcohol or use drugs.   Family History:  The patient's family history includes Bone cancer in her sister; CAD in her brother; Heart attack (age of onset: 35) in her brother; Throat cancer in her daughter.    ROS:  Please see the history of present illness.   Otherwise, review of systems are positive for DOE.   All other systems are reviewed and negative.    PHYSICAL EXAM: VS:  BP 140/78   Pulse 66   Ht 5\' 4"  (1.626 m)   Wt 208 lb (94.3 kg)   SpO2 94%   BMI 35.70 kg/m  , BMI Body mass index is 35.7 kg/m. GEN: Well nourished, well developed, in no acute distress  HEENT: normal  Neck: no JVD, carotid bruits, or masses Cardiac: RRR; no murmurs, rubs, or gallops,no edema  Respiratory:  clear to auscultation bilaterally, normal work of breathing GI: soft, nontender, nondistended, + BS, obese MS: no deformity or atrophy  Skin: warm and dry, no rash Neuro:  Strength and sensation are intact Psych: euthymic mood, full affect   EKG:   The ekg ordered today demonstrates NSR, nonspecific ST segment changes   Recent Labs: No results found for requested labs within last 8760 hours.   Lipid Panel No results found for: CHOL, TRIG, HDL, CHOLHDL, VLDL, LDLCALC, LDLDIRECT   Other studies  Reviewed: Additional studies/ records that were reviewed today with results demonstrating: Cr 1.0 in 8/18.   ASSESSMENT AND PLAN:  1. CAD: No clear angina.  Prior LAD stent.  COntinue secondary prevention.  She has access to an elliptical.  I encouraged her to try this as it may be easier on her joints. 2. SHOB: Likely multifactorial.  She has a component of asthma.  She also likely has some deconditioning.  Continue to encourage regular exercise.  She is also continuing treatment with pulmonary.  There could be a component of volume overload.  We will switch her to valsartan 160 mg daily and stop HCTZ.  Start furosemide 40 mg daily.  Start potassium 20 mEq daily.  Check electrolytes in a week. 3. HTN: Fairly well controlled.   4. Need to verify meds and then recheck lipids if they have not been checked recently.  Her medicine list   Current medicines are reviewed at length with the patient today.  The patient concerns regarding her medicines were addressed.  The following changes have been made:  No change  Labs/ tests ordered today include:  No orders of the defined types were placed in this encounter.   Recommend 150 minutes/week of aerobic exercise Low fat, low carb, high fiber diet recommended  Disposition:   FU in 6 months   Signed, Larae Grooms, MD  10/27/2017 4:08 PM    [facility] [place], [place], [place]  [address] Phone: [phone]; Fax: [phone]

## 2017-10-27 ENCOUNTER — Encounter (INDEPENDENT_AMBULATORY_CARE_PROVIDER_SITE_OTHER): Payer: Self-pay

## 2017-10-27 ENCOUNTER — Ambulatory Visit: Payer: Medicare Other | Admitting: Interventional Cardiology

## 2017-10-27 ENCOUNTER — Encounter: Payer: Self-pay | Admitting: Interventional Cardiology

## 2017-10-27 VITALS — BP 140/78 | HR 66 | Ht 64.0 in | Wt 208.0 lb

## 2017-10-27 DIAGNOSIS — R0609 Other forms of dyspnea: Secondary | ICD-10-CM | POA: Diagnosis not present

## 2017-10-27 DIAGNOSIS — I10 Essential (primary) hypertension: Secondary | ICD-10-CM

## 2017-10-27 DIAGNOSIS — R0600 Dyspnea, unspecified: Secondary | ICD-10-CM

## 2017-10-27 DIAGNOSIS — I25119 Atherosclerotic heart disease of native coronary artery with unspecified angina pectoris: Secondary | ICD-10-CM

## 2017-10-27 MED ORDER — FUROSEMIDE 40 MG PO TABS: 40.0000 mg | ORAL_TABLET | Freq: Every day | ORAL | 3 refills | Status: DC

## 2017-10-27 MED ORDER — VALSARTAN 160 MG PO TABS: 160.0000 mg | ORAL_TABLET | Freq: Every day | ORAL | 3 refills | Status: DC

## 2017-10-27 MED ORDER — POTASSIUM CHLORIDE CRYS ER 20 MEQ PO TBCR: 20.0000 meq | EXTENDED_RELEASE_TABLET | Freq: Every day | ORAL | 3 refills | Status: DC

## 2017-10-27 NOTE — Patient Instructions (Signed)
Medication Instructions:  Your physician has recommended you make the following change in your medication:   1. STOP: valsartan/HCTZ  2. START: valsartan 160 mg daily  3. START: lasix 40 mg daily  4. START: potassium 20 mEq daily  Labwork: Your physician has recommended you make the following change in your medication: 1 week for BMET  Testing/Procedures: None ordered  Follow-Up: Keep your follow up appointment with pulmonology  Any Other Special Instructions Will Be Listed Below (If Applicable).     If you need a refill on your cardiac medications before your next appointment, please call your pharmacy.

## 2017-11-03 ENCOUNTER — Telehealth: Payer: Self-pay

## 2017-11-03 ENCOUNTER — Other Ambulatory Visit: Payer: Medicare Other | Admitting: *Deleted

## 2017-11-03 DIAGNOSIS — I25119 Atherosclerotic heart disease of native coronary artery with unspecified angina pectoris: Secondary | ICD-10-CM

## 2017-11-03 DIAGNOSIS — I10 Essential (primary) hypertension: Secondary | ICD-10-CM

## 2017-11-03 LAB — BASIC METABOLIC PANEL
BUN/Creatinine Ratio: 18 (ref 12–28)
BUN: 20 mg/dL (ref 8–27)
CO2: 22 mmol/L (ref 20–29)
Calcium: 9.7 mg/dL (ref 8.7–10.3)
Chloride: 101 mmol/L (ref 96–106)
Creatinine, Ser: 1.13 mg/dL — ABNORMAL HIGH (ref 0.57–1.00)
GFR calc Af Amer: 52 mL/min/{1.73_m2} — ABNORMAL LOW (ref >59)
GFR calc non Af Amer: 45 mL/min/{1.73_m2} — ABNORMAL LOW (ref >59)
Glucose: 115 mg/dL — ABNORMAL HIGH (ref 65–99)
Potassium: 5 mmol/L (ref 3.5–5.2)
Sodium: 138 mmol/L (ref 134–144)

## 2017-11-03 NOTE — Telephone Encounter (Signed)
Patient in the office for labs and brought all of her medicines and wanted to update her med list. Med list updated.

## 2017-11-21 IMAGING — CT CT CHEST W/O CM
2 of 3 series · 15 of 36 positions shown, 18 images · non-contrast
Comparison: 02/19/2016 chest CT.

CLINICAL DATA: Follow-up pulmonary nodules.  Former smoker.

EXAM:
CT CHEST WITHOUT CONTRAST
TECHNIQUE: Multidetector CT imaging of the chest was performed following the
standard protocol without IV contrast.

[Series 2: thorax · axial · 0.72mm/px · z∈[+1292,+1528]mm · 12 of 140 slices shown, 15 images]
[im 11/140  mediastinal]
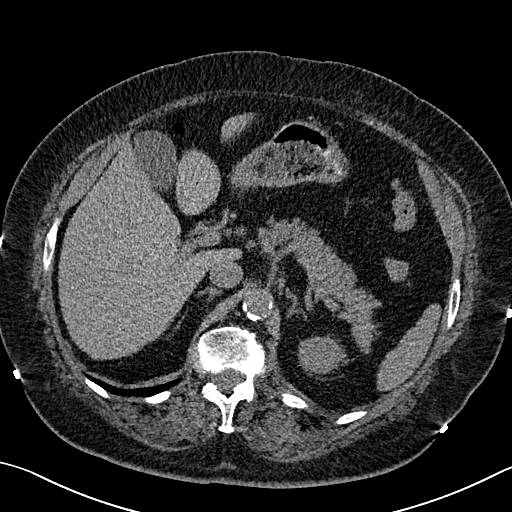
[im 11/140  lung]
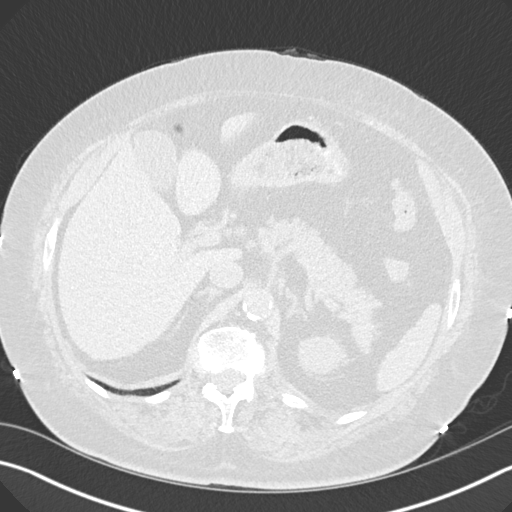
[im 21/140  lung]
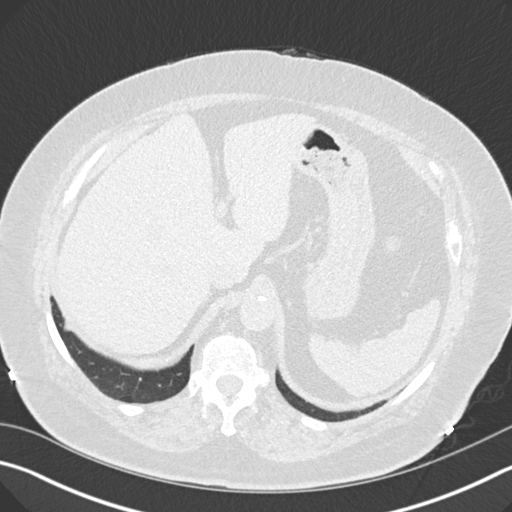
[im 31/140  lung]
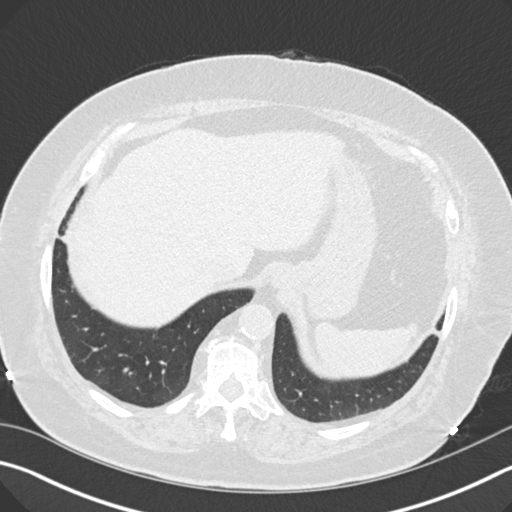
[im 42/140  lung]
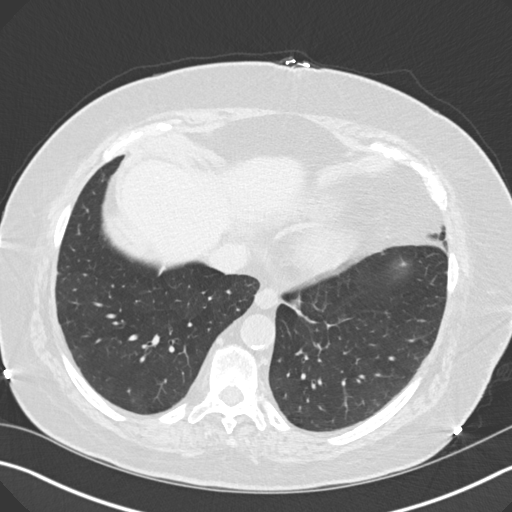
[im 52/140  mediastinal]
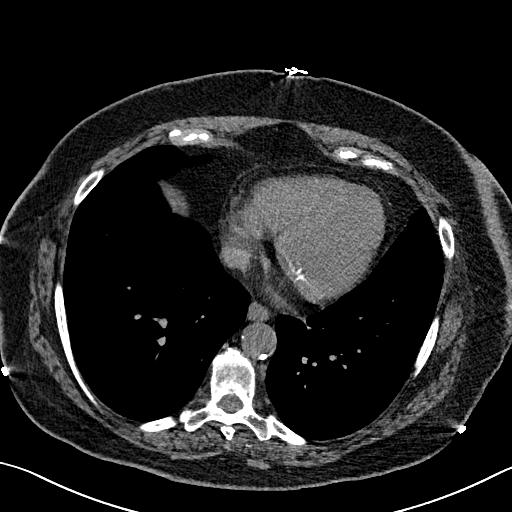
[im 52/140  lung]
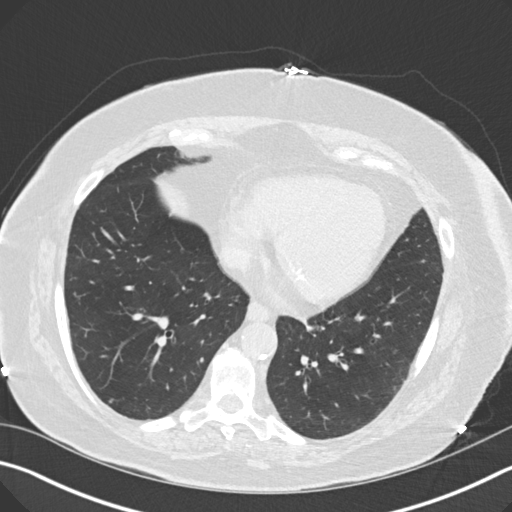
[im 62/140  lung]
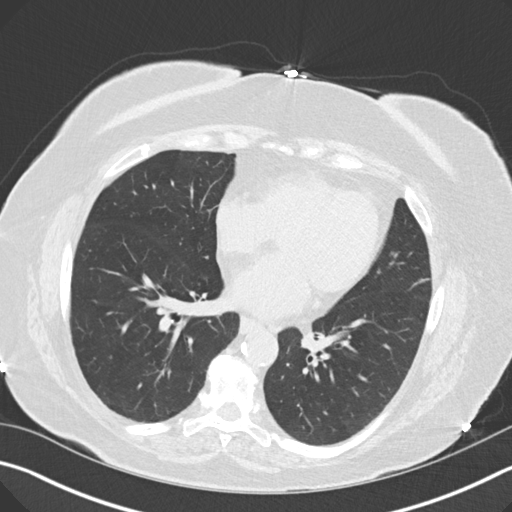
[im 78/140  lung]
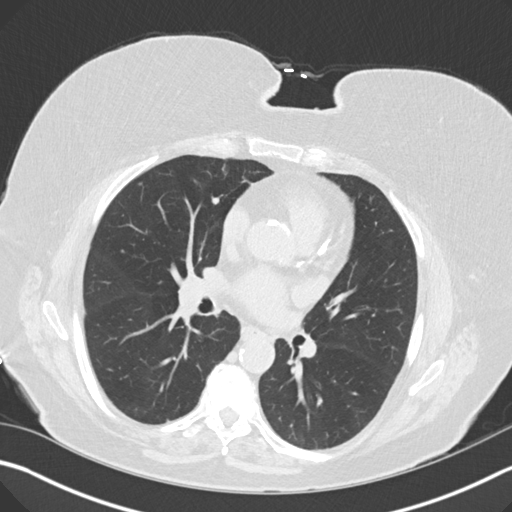
[im 88/140  lung]
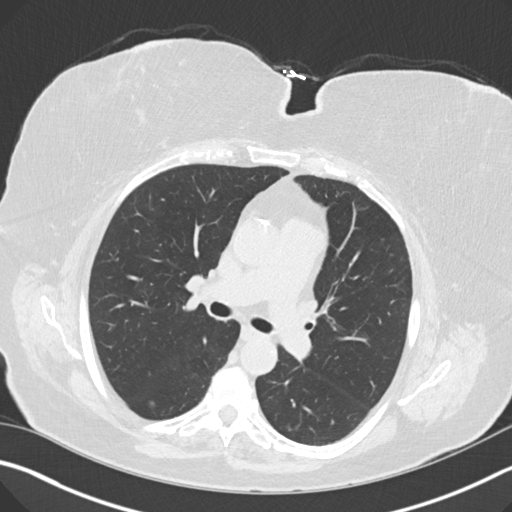
[im 98/140  mediastinal]
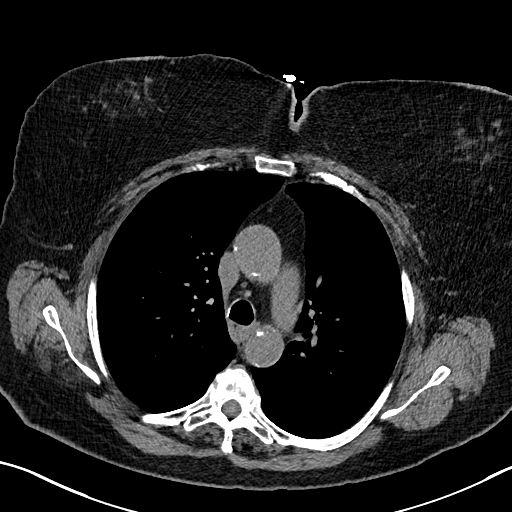
[im 98/140  lung]
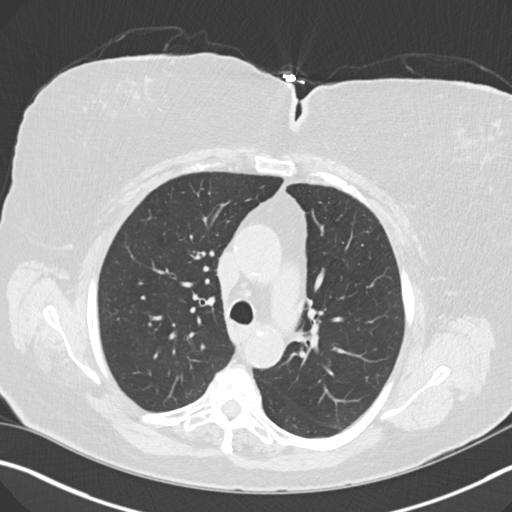
[im 109/140  lung]
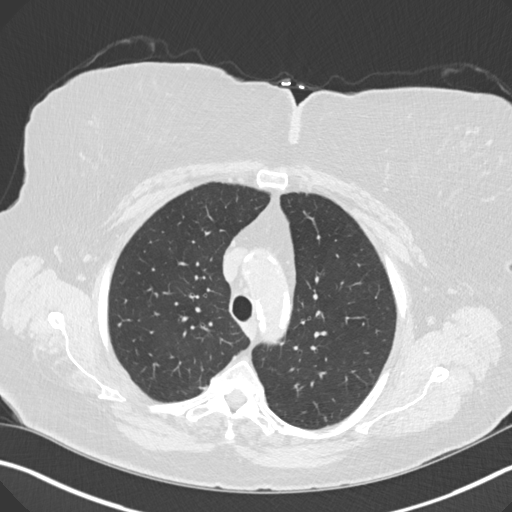
[im 119/140  lung]
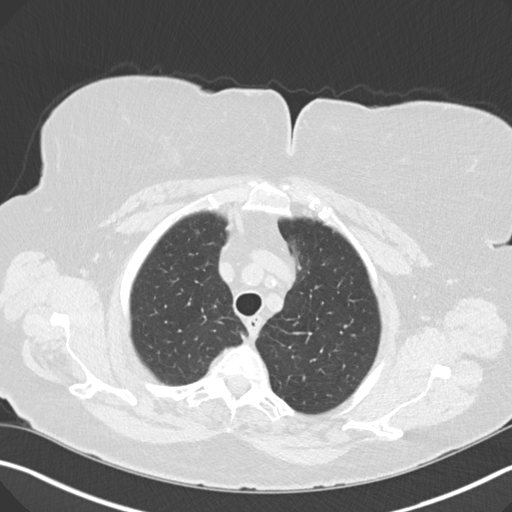
[im 129/140  lung]
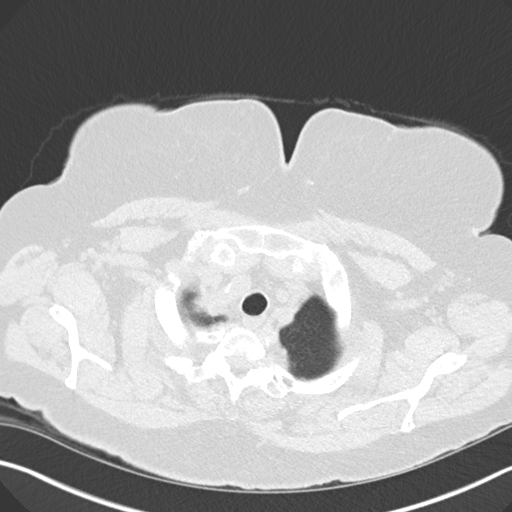

[Series 5: coronal · coronal · 0.58mm/px · 3 of 143 slices shown]
[im 29/143  lung]
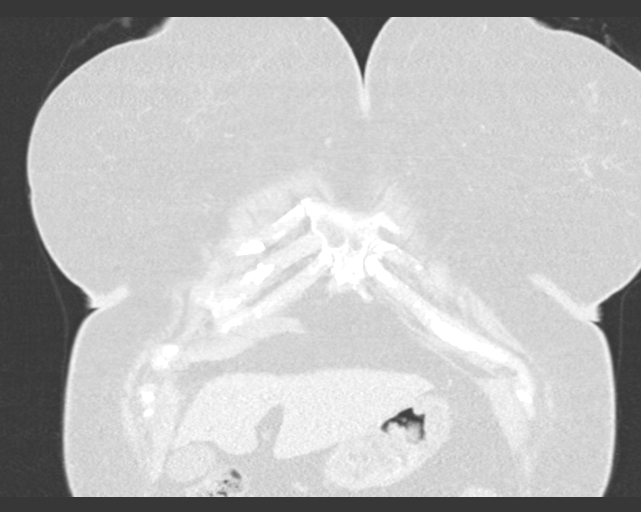
[im 57/143  lung]
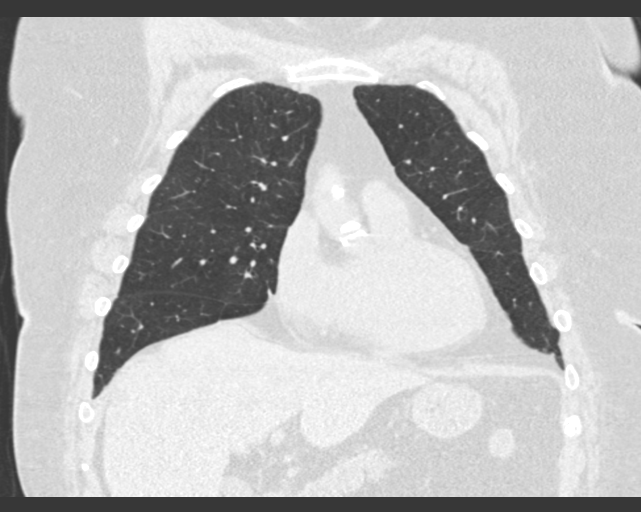
[im 86/143  lung]
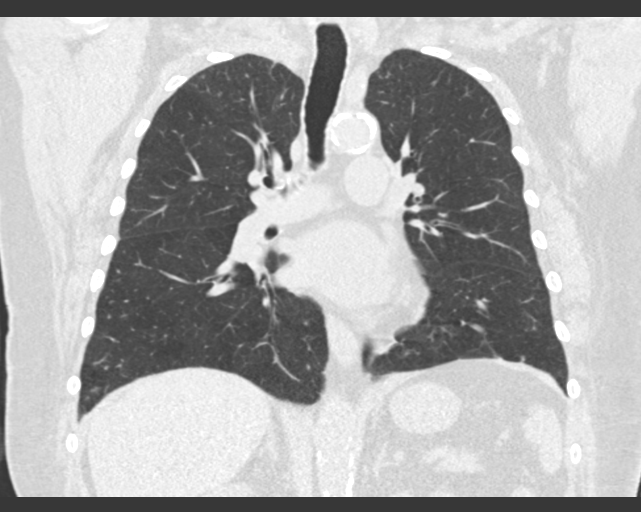

[15 of 36 positions shown; findings below may reference images not displayed]

FINDINGS: Cardiovascular: Normal heart size. No significant pericardial
fluid/thickening. Left main and left anterior descending coronary
atherosclerosis. Atherosclerotic nonaneurysmal thoracic aorta.
Normal caliber pulmonary arteries.

Mediastinum/Nodes: No discrete thyroid nodules. Unremarkable
esophagus. No pathologically enlarged axillary, mediastinal or gross
hilar lymph nodes, noting limited sensitivity for the detection of
hilar adenopathy on this noncontrast study.

Lungs/Pleura: No pneumothorax. No pleural effusion. No acute
consolidative airspace disease or lung masses. There are innumerable
(> than 30) subcentimeter pulmonary nodules scattered throughout
both lungs involving all lung lobes, which appear generally
centrilobular in distribution, some solid and others subsolid in
density, not convincingly changed back to at least 01/06/2015 chest
CT, compatible with benign postinflammatory nodularity.
Representative 8 mm basilar right lower lobe (series 3/ image 119),
5 mm medial right middle lobe (series 3/ image 63) and 4 mm lingular
(series 3/ image 72) pulmonary nodules, all stable back to
01/06/2015 accounting for differences in slice thickness. No
appreciable new pulmonary nodules.

Upper abdomen: Unremarkable.

Musculoskeletal: No aggressive appearing focal osseous lesions.
Moderate thoracic spondylosis.
IMPRESSION: 1. Greater than 2 year stability of innumerable subcentimeter
pulmonary nodules scattered throughout both lungs, generally
centrilobular in distribution, compatible with benign
postinflammatory nodularity .
2. No active pulmonary disease .
3. Left main and 1 vessel coronary atherosclerosis.

Aortic Atherosclerosis ([id number]-[id number]).

## 2018-01-14 ENCOUNTER — Other Ambulatory Visit: Payer: Self-pay | Admitting: Interventional Cardiology

## 2018-01-14 MED ORDER — FUROSEMIDE 40 MG PO TABS: 40.0000 mg | ORAL_TABLET | Freq: Every day | ORAL | 2 refills | Status: DC

## 2018-01-14 MED ORDER — POTASSIUM CHLORIDE CRYS ER 20 MEQ PO TBCR: 20.0000 meq | EXTENDED_RELEASE_TABLET | Freq: Every day | ORAL | 2 refills | Status: DC

## 2018-04-09 ENCOUNTER — Telehealth: Payer: Self-pay | Admitting: Interventional Cardiology

## 2018-04-09 NOTE — Telephone Encounter (Addendum)
NEW MESSAGE   Patient declined to see APP  Pt c/o of Chest Pain: STAT if CP now or developed within 24 hours  1. Are you having CP right now? NO  2. Are you experiencing any other symptoms (ex. SOB, nausea, vomiting, sweating)? SOB  3. How long have you been experiencing CP? STARTED 2 WEEKS AGO  4. Is your CP continuous or coming and going? COMING AND GOING  5. Have you taken Nitroglycerin? YES?

## 2018-04-09 NOTE — Telephone Encounter (Signed)
Patient calling back and states that about 3 weeks ago she had an episode in the middle of the night where she woke up and had tightness in her chest and felt SOB. She rates the discomfort 6/10 and states that it was in the center of her chest, slightly to the left. She states that she took NTG x 1 with relief. She states that she had another similar episode in the middle of the night about 2 weeks ago that was relieved with NTG x 1. She states that these episodes were always at night when she was sleeping and were not associated with exertion. She denies radiation, n/v, lightheadedness, dizziness, or any other symptoms. Patient also states that her SOB on exertion has worsened over the past several months. Patient denies swelling in her lower extremities and states that she does not weigh herself daily because she does not want to know what she weighs. Patient states her BP has been good but does not have any values. Patient states that her heart does feel irregular sometimes, like it is "acting up". Patient states that she has been taking all of her medicines like she is supposed to. Patient is a poor historian. Patient denies any symptoms at this time. Event monitor from 2017 showed NSR with PACs and echo from 2016 showed normal LVEF and mild MR. Patient has an appointment with Dr. Elsworth Soho on 05/12/18. Made patient aware that the information would be forwarded to Dr. Irish Lack for review and recommendations.

## 2018-04-09 NOTE — Telephone Encounter (Signed)
Left message for patient to call back  

## 2018-04-10 NOTE — Telephone Encounter (Signed)
Ok to set up for echo due to [facility].

## 2018-04-13 NOTE — Telephone Encounter (Signed)
Attempted to contact patient but there was no answer and VM was full. Will try again later.

## 2018-04-13 NOTE — Telephone Encounter (Signed)
Attempted to contact patientagainbut there was no answer and VM was full. Will try againat another time. 

## 2018-04-14 NOTE — Telephone Encounter (Signed)
Attempted to contact patient again but there was no answer and VM was full. Will try again at another time.

## 2018-04-20 NOTE — Telephone Encounter (Signed)
Attempted to contact patientagainbut there was no answer and VM was full. Will try againat another time.

## 2018-04-29 NOTE — Telephone Encounter (Signed)
Attempted to contact patientagainbut there was no answer and VM was full. Letter sent.

## 2018-05-08 ENCOUNTER — Telehealth: Payer: Self-pay | Admitting: Interventional Cardiology

## 2018-05-08 NOTE — Telephone Encounter (Signed)
New Message    Patient recv'd a letter from Dr. Irish Lack nurse. Patient was calling to see what this was in reference to.

## 2018-05-08 NOTE — Telephone Encounter (Signed)
Spoke with pt and she states that SOB and CP have improved.  No issues at this time.  Pt has COPD and said that she is seeing doctor for that next week.  Advised her Dr. Irish Lack said we could order echo.  Pt does not wish to do at this time.  Advised her if changes in her sx let us know and we can order echo or get her in to be seen.  Pt verbalized understanding and was in agreement with this plan.

## 2018-05-12 ENCOUNTER — Ambulatory Visit (INDEPENDENT_AMBULATORY_CARE_PROVIDER_SITE_OTHER): Payer: Medicare Other | Admitting: Pulmonary Disease

## 2018-05-12 ENCOUNTER — Encounter: Payer: Self-pay | Admitting: Pulmonary Disease

## 2018-05-12 DIAGNOSIS — J449 Chronic obstructive pulmonary disease, unspecified: Secondary | ICD-10-CM | POA: Diagnosis not present

## 2018-05-12 DIAGNOSIS — G4733 Obstructive sleep apnea (adult) (pediatric): Secondary | ICD-10-CM

## 2018-05-12 MED ORDER — BUDESONIDE-FORMOTEROL FUMARATE 160-4.5 MCG/ACT IN AERO: 2.0000 | INHALATION_SPRAY | Freq: Two times a day (BID) | RESPIRATORY_TRACT | 0 refills | Status: DC

## 2018-05-12 NOTE — Assessment & Plan Note (Signed)
Refills on Symbicort.  Call us if cough gets worse

## 2018-05-12 NOTE — Addendum Note (Signed)
Addended by: Valerie Salts on: 05/12/2018 04:08 PM   Modules accepted: Orders

## 2018-05-12 NOTE — Assessment & Plan Note (Signed)
Trial of nasal mask instead of full facemask.  Weight loss encouraged, compliance with goal of at least 4-6 hrs every night is the expectation. Advised against medications with sedative side effects Cautioned against driving when sleepy - understanding that sleepiness will vary on a day to day basis

## 2018-05-12 NOTE — Progress Notes (Signed)
   Subjective:    Patient ID: Sheryl Curtis, female    DOB: [birth year], 84 y.o.   MRN: [id number]  HPI  84 yo remote smoker with severe RA,for FU of  airway she takes Lasix daily for bipedal edema. Obstruction & OSA She has severe rheumatoid arthritis with previous MTX use.  CT chest has shown multiple pulmonary nodules that have been stable, last 2018  She has prior history of rectal cancer status post resection in 2007 She underwent stent to LAD in 04/2013.   She is compliant with Symbicort and denies wheezing She takes Lasix daily for bipedal edema and complains of nocturia. She has a chronic dry cough now for a month or 2. She is compliant with her CPAP machine but this aggravates her because she has a leak from around a full facemask. CPAP download was reviewed which shows excellent compliance with no residual events and mild leak        Significant tests/ events CT chest 03/2017 -stable multiple pulmonary nodules since 2015  Spirometry in 05/2010 showed FEV1 of 76% improvement postbronchodilator to 80%-1.60 with a ratio of 75 suggesting mild restriction. Spirometry 03/2014 FEV1 of 1.25-64%, with ratio 65 and FVC of 1.91-72%.  PFTs - 12/2014 - no airway obstruction, ratio 71, pos BD response 06/2015 Admit for UTI and Sepsis   NPSG >> AHI 10/h corrected by CPAP 10 cm  Echo >> normal LV function without any evidence of diastolic dysfunction.   Review of Systems neg for any significant sore throat, dysphagia, itching, sneezing, nasal congestion or excess/ purulent secretions, fever, chills, sweats, unintended wt loss, pleuritic or exertional cp, hempoptysis, orthopnea pnd or change in chronic leg swelling.   Also denies presyncope, palpitations, heartburn, abdominal pain, nausea, vomiting, diarrhea or change in bowel or urinary habits, dysuria,hematuria, rash, arthralgias, visual complaints, headache, numbness weakness or ataxia.     Objective:   Physical  Exam  Gen. Pleasant, elderly, obese, in no distress ENT - no lesions, no post nasal drip Neck: No JVD, no thyromegaly, no carotid bruits Lungs: no use of accessory muscles, no dullness to percussion, decreased without rales or rhonchi  Cardiovascular: Rhythm regular, heart sounds  normal, no murmurs or gallops, 1+ peripheral edema Musculoskeletal: No deformities, no cyanosis or clubbing , no tremors       Assessment & Plan:

## 2018-05-12 NOTE — Patient Instructions (Signed)
Trial of nasal mask instead of full facemask. Refills on Symbicort.  Call us if cough gets worse

## 2018-06-04 ENCOUNTER — Telehealth: Payer: Self-pay | Admitting: Pulmonary Disease

## 2018-06-04 NOTE — Telephone Encounter (Signed)
LMOMTCB x 1 

## 2018-06-05 NOTE — Telephone Encounter (Signed)
Attempted to call patient today regarding pt's concerns of a nebulizer machine. I did not receive an answer at time of call. I have left a voicemail message for pt to return call. X1

## 2018-06-08 NOTE — Telephone Encounter (Signed)
Attempted to call patient today regarding questions on nebulizer machine. I did not receive an answer at time of call. I have left a voicemail message for pt to return call. X3  Per triage protocol we have tried calling pt over 2 times no response Closing this message, and will reopen another message when pt returns call.

## 2018-06-09 ENCOUNTER — Telehealth: Payer: Self-pay | Admitting: Pulmonary Disease

## 2018-06-09 NOTE — Telephone Encounter (Signed)
Called and spoke with pt regarding RA recommendations Pt advised she tried the full face mask and it didn't work Pt still has nose bleeds, advised her to see ENT She will call tomorrow to her ENT to make appt Nothing further needed at this time.

## 2018-06-09 NOTE — Telephone Encounter (Signed)
She can try a full facemask. If bleeding recurs she may have to see ENT

## 2018-06-09 NOTE — Telephone Encounter (Signed)
Called and spoke with pt who stated the nasal mask has caused her nose to bleed and due to this she has stopped using it.  Due to pt being on a blood thinner and with the bleeding that has been happening with her nose, she has been afraid to use it. Pt is wanting recommendations.  Dr. Elsworth Soho, please advise. Thanks!

## 2018-06-24 ENCOUNTER — Telehealth: Payer: Self-pay | Admitting: Interventional Cardiology

## 2018-06-24 NOTE — Telephone Encounter (Signed)
Called patient back about her message. Patient stated she had a fall 4 weeks ago and has been having chest pain that comes and goes. Patient stated her chest pain is dull and heavy. Patient called last month about similair symptoms, at that time Dr. Irish Lack had suggested an echocardiogram (see phone note 05/08/18). Encouraged patient to go to ED for chest pain. Patient stated she does not feel like she needs to go to ED. Patient stated she would not go to ED, due to having to wait forever and it being expensive. Made patient an appointment with PA next week, first available. Patient would rather see Dr. Irish Lack, but his first available is in December. Patient stated she will be seeing her PCP tomorrow. Will forward to Dr. Irish Lack for further advisement.

## 2018-06-24 NOTE — Telephone Encounter (Signed)
If she fell on her chest, we should do a chest xray tp eval for any rib fractures.  COntinue with plans for echo.

## 2018-06-24 NOTE — Telephone Encounter (Signed)
.  Pt c/o of Chest Pain: STAT if CP now or developed within 24 hours  1. Are you having CP right now? No   2. Are you experiencing any other symptoms (ex. SOB, nausea, vomiting, sweating)? No   3. How long have you been experiencing CP? All Summer   4. Is your CP continuous or coming and going? Coming and Going   5. Have you taken Nitroglycerin? No  ?

## 2018-06-24 NOTE — Telephone Encounter (Signed)
Left message for patient to call back  

## 2018-06-25 ENCOUNTER — Other Ambulatory Visit: Payer: Self-pay | Admitting: Family Medicine

## 2018-06-25 ENCOUNTER — Ambulatory Visit
Admission: RE | Admit: 2018-06-25 | Discharge: 2018-06-25 | Disposition: A | Discharge status: Home / Self Care | Payer: Medicare Other | Source: Ambulatory Visit | Attending: Family Medicine | Admitting: Family Medicine

## 2018-06-25 DIAGNOSIS — M5440 Lumbago with sciatica, unspecified side: Secondary | ICD-10-CM

## 2018-07-01 ENCOUNTER — Ambulatory Visit: Payer: Medicare Other | Admitting: Physician Assistant

## 2018-07-01 NOTE — Telephone Encounter (Signed)
Patient is scheduled for appointment with JV tomorrow.

## 2018-07-01 NOTE — Progress Notes (Deleted)
Cardiology Office Note    Date:  07/01/2018   ID:  Sheryl Curtis, DOB Apr 21, 1932, MRN [id number]  PCP:  Aurea Graff.Marlou Sa, MD  Cardiologist: No primary care provider on file.  No chief complaint on file.   History of Present Illness:  Sheryl Curtis is a 84 y.o. female history of CAD status post LAD stent in 2014, history of palpitations with negative monitor in the past, hypertension.  Last saw Dr. Irish Lack 09/2017 at which time she was short of breath felt to be multifactorial including a component of asthma, deconditioning and possible volume overload.  She was put on valsartan 160 mg daily and Lasix 40 mg daily as well as potassium.  Patient recently called and short of breath and was told to get an echo and follow-up.  Echo was never done.  She also called in complaining she fell on her chest and was having some chest pain and chest x-ray ordered.  She ended up having a lumbar spine x-ray that showed thoracic spine scoliosis and degenerative change of both hips    Past Medical History:  Diagnosis Date  . ALLERGIC RHINITIS   . Atrial fibrillation ([place])   . CANCER, COLORECTAL   . CHEST PAIN, ATYPICAL   . DYSPNEA   . Essential hypertension, benign   . FIBROMYALGIA   . G E R D   . History of stress test    Myoview 8/16:  EF 69%, anterior and apical defect c/w breast attenuation; Low Risk   . INSOMNIA   . Other and unspecified angina pectoris     Past Surgical History:  Procedure Laterality Date  . ABDOMINAL HYSTERECTOMY    . BACK SURGERY    . BREAST SURGERY    . COLON SURGERY    . CORONARY STENT PLACEMENT    . PERCUTANEOUS CORONARY STENT INTERVENTION (PCI-S) N/A 05/19/2013   Procedure: PERCUTANEOUS CORONARY STENT INTERVENTION (PCI-S);  Surgeon: Jettie Booze, MD;  Location: [facility] CATH LAB;  Service: Cardiovascular;  Laterality: N/A;    Current Medications: No outpatient medications have been marked as taking for the 07/01/18 encounter (Appointment) with Imogene Burn,  PA-C.     Allergies:   Aspirin; Hydroxyquinolines; Imipramine hcl; Pamelor [nortriptyline hcl]; Prednisone; Procardia [nifedipine]; Tramadol; Ace inhibitors; and Plaquenil [hydroxychloroquine sulfate]   Social History   Socioeconomic History  . Marital status: Widowed    Spouse name: Not on file  . Number of children: 4  . Years of education: Not on file  . Highest education level: Not on file  Occupational History  . Not on file  Social Needs  . Financial resource strain: Not on file  . Food insecurity:    Worry: Not on file    Inability: Not on file  . Transportation needs:    Medical: Not on file    Non-medical: Not on file  Tobacco Use  . Smoking status: Former Smoker    Packs/day: 1.00    Years: 12.00    Pack years: 12.00    Types: Cigarettes    Start date: 10/28/1949    Last attempt to quit: 10/28/1962    Years since quitting: 55.7  . Smokeless tobacco: Never Used  Substance and Sexual Activity  . Alcohol use: No  . Drug use: No  . Sexual activity: Not on file  Lifestyle  . Physical activity:    Days per week: Not on file    Minutes per session: Not on file  . Stress: Not  on file  Relationships  . Social connections:    Talks on phone: Not on file    Gets together: Not on file    Attends religious service: Not on file    Active member of club or organization: Not on file    Attends meetings of clubs or organizations: Not on file    Relationship status: Not on file  Other Topics Concern  . Not on file  Social History Narrative  . Not on file     Family History:  The patient's ***family history includes Bone cancer in her sister; CAD in her brother; Heart attack (age of onset: 53) in her brother; Throat cancer in her daughter.   ROS:   Please see the history of present illness.    ROS All other systems reviewed and are negative.   PHYSICAL EXAM:   VS:  There were no vitals taken for this visit.  Physical Exam  GEN: Well nourished, well developed, in  no acute distress  HEENT: normal  Neck: no JVD, carotid bruits, or masses Cardiac:RRR; no murmurs, rubs, or gallops  Respiratory:  clear to auscultation bilaterally, normal work of breathing GI: soft, nontender, nondistended, + BS Ext: without cyanosis, clubbing, or edema, Good distal pulses bilaterally MS: no deformity or atrophy  Skin: warm and dry, no rash Neuro:  Alert and Oriented x 3, Strength and sensation are intact Psych: euthymic mood, full affect  Wt Readings from Last 3 Encounters:  05/12/18 205 lb 9.6 oz (93.3 kg)  10/27/17 208 lb (94.3 kg)  09/02/17 207 lb (93.9 kg)      Studies/Labs Reviewed:   EKG:  EKG is*** ordered today.  The ekg ordered today demonstrates ***  Recent Labs: 11/03/2017: BUN 20; Creatinine, Ser 1.13; Potassium 5.0; Sodium 138   Lipid Panel No results found for: CHOL, TRIG, HDL, CHOLHDL, VLDL, LDLCALC, LDLDIRECT  Additional studies/ records that were reviewed today include:   2D echo 9/2016Study Conclusions   - Left ventricle: The cavity size was normal. There was mild focal   basal hypertrophy of the septum. Systolic function was normal.   The estimated ejection fraction was in the range of 60% to 65%.   Wall motion was normal; there were no regional wall motion   abnormalities. Doppler parameters are consistent with elevated   ventricular end-diastolic filling pressure. - Mitral valve: Calcified annulus. There was mild regurgitation. - Left atrium: The atrium was mildly dilated. - Atrial septum: No defect or patent foramen ovale was identified.  Monitor 12/2015   Study Highlights    NSR with occasional PACs.  No pathologic arrhythmias.     Nuclear stress test 2016  Nuclear stress EF: 69%.  Defect 1: There is a small defect of mild severity present in the mid anterior and apical anterior location. This defect is most consistent with breast attenuation  This is a low risk study.  The left ventricular ejection fraction is  hyperdynamic (>65%).    ASSESSMENT:    1. Atherosclerosis of native coronary artery of native heart without angina pectoris   2. Essential hypertension      PLAN:  In order of problems listed above:    Medication Adjustments/Labs and Tests Ordered: Current medicines are reviewed at length with the patient today.  Concerns regarding medicines are outlined above.  Medication changes, Labs and Tests ordered today are listed in the Patient Instructions below. There are no Patient Instructions on file for this visit.   Signed, Ermalinda Barrios, PA-C  07/01/2018 11:07 AM    [address] [address], [place], [place]  [address] Phone: [phone]; Fax: [phone]

## 2018-07-02 ENCOUNTER — Encounter: Payer: Self-pay | Admitting: Interventional Cardiology

## 2018-07-02 ENCOUNTER — Ambulatory Visit (INDEPENDENT_AMBULATORY_CARE_PROVIDER_SITE_OTHER): Payer: Medicare Other | Admitting: Interventional Cardiology

## 2018-07-02 ENCOUNTER — Ambulatory Visit
Admission: RE | Admit: 2018-07-02 | Discharge: 2018-07-02 | Disposition: A | Discharge status: Home / Self Care | Payer: Medicare Other | Source: Ambulatory Visit | Attending: Interventional Cardiology | Admitting: Interventional Cardiology

## 2018-07-02 ENCOUNTER — Encounter

## 2018-07-02 VITALS — BP 115/58 | HR 78 | Ht 64.0 in | Wt 207.4 lb

## 2018-07-02 DIAGNOSIS — I25119 Atherosclerotic heart disease of native coronary artery with unspecified angina pectoris: Secondary | ICD-10-CM

## 2018-07-02 DIAGNOSIS — W19XXXA Unspecified fall, initial encounter: Secondary | ICD-10-CM | POA: Diagnosis not present

## 2018-07-02 DIAGNOSIS — R0609 Other forms of dyspnea: Secondary | ICD-10-CM

## 2018-07-02 DIAGNOSIS — R0600 Dyspnea, unspecified: Secondary | ICD-10-CM

## 2018-07-02 DIAGNOSIS — I10 Essential (primary) hypertension: Secondary | ICD-10-CM | POA: Diagnosis not present

## 2018-07-02 NOTE — Progress Notes (Signed)
Cardiology Office Note   Date:  07/02/2018   ID:  Sheryl Curtis, DOB 06/15/1932, MRN [id number]  PCP:  Aurea Graff.Marlou Sa, MD    No chief complaint on file.  CAD  Wt Readings from Last 3 Encounters:  07/02/18 207 lb 6.4 oz (94.1 kg)  05/12/18 205 lb 9.6 oz (93.3 kg)  10/27/17 208 lb (94.3 kg)       History of Present Illness: Sheryl Curtis is a 84 y.o. female  Who has had CAD and an LAD stent in 2014.   She had a negative monitor for palpitations in the past.    At the Dec 2018 visit, She reported chronic DOE and weight gain since starting steroid injections for her back.   Echo was recommended in June 2019 for [facility] but this did not happen.  SHe had a fall and had some more SHOB.  CXR was recommended but did not happen as well. She fell on her right hip and shoulder.  She could not walk easily for a few weeks.   She has had some heaviness in her chest at times.  She has had several episodes of chest pain.  SHe used some NTG with relief.   She has since stopped the steroid injections.  Prior to her fall, she was exercising a few times a week and was feeling better.   She has not been taking her NSAID.  She has been using CBD for pain relief.    Past Medical History:  Diagnosis Date  . ALLERGIC RHINITIS   . Atrial fibrillation ([place])   . CANCER, COLORECTAL   . CHEST PAIN, ATYPICAL   . DYSPNEA   . Essential hypertension, benign   . FIBROMYALGIA   . G E R D   . History of stress test    Myoview 8/16:  EF 69%, anterior and apical defect c/w breast attenuation; Low Risk   . INSOMNIA   . Other and unspecified angina pectoris     Past Surgical History:  Procedure Laterality Date  . ABDOMINAL HYSTERECTOMY    . BACK SURGERY    . BREAST SURGERY    . COLON SURGERY    . CORONARY STENT PLACEMENT    . PERCUTANEOUS CORONARY STENT INTERVENTION (PCI-S) N/A 05/19/2013   Procedure: PERCUTANEOUS CORONARY STENT INTERVENTION (PCI-S);  Surgeon: Jettie Booze, MD;  Location:  [facility] CATH LAB;  Service: Cardiovascular;  Laterality: N/A;     Current Outpatient Medications  Medication Sig Dispense Refill  . albuterol (PROVENTIL HFA;VENTOLIN HFA) 108 (90 Base) MCG/ACT inhaler Inhale 2 puffs into the lungs every 6 (six) hours as needed for wheezing or shortness of breath. 1 Inhaler 6  . albuterol (PROVENTIL) (2.5 MG/3ML) 0.083% nebulizer solution 1 vial in neb every 6 hours and as needed Dx J45.909 360 mL 11  . Albuterol Sulfate (PROAIR RESPICLICK) 124 (90 Base) MCG/ACT AEPB Inhale 1-2 puffs into the lungs every 6 (six) hours as needed (for shortness of breath and/or wheezing.). 1 each 6  . ALPRAZolam (XANAX) 0.25 MG tablet Take 0.25 mg by mouth at bedtime as needed for sleep.     Marland Kitchen amLODipine (NORVASC) 2.5 MG tablet Take 2.5 mg by mouth daily as needed (for systolic BP >580).    . Ascorbic Acid (VITAMIN C) 1000 MG tablet Take 1,000 mg by mouth daily.    . budesonide-formoterol (SYMBICORT) 160-4.5 MCG/ACT inhaler Inhale 2 puffs into the lungs 2 (two) times daily.    . budesonide-formoterol (SYMBICORT)  160-4.5 MCG/ACT inhaler Inhale 2 puffs into the lungs 2 (two) times daily. 2 Inhaler 0  . CALCIUM PO Take 1,200 mg by mouth daily.     . cetirizine (ZYRTEC) 10 MG tablet Take 10 mg by mouth daily.    . clopidogrel (PLAVIX) 75 MG tablet TAKE 1 TABLET(75 MG) BY MOUTH EVERY OTHER DAY 45 tablet 2  . DULoxetine (CYMBALTA) 60 MG capsule Take 60 mg by mouth daily.    . furosemide (LASIX) 40 MG tablet Take 1 tablet (40 mg total) by mouth daily. 90 tablet 2  . HYDROcodone-acetaminophen (NORCO) 7.5-325 MG tablet Take 1 tablet by mouth every 6 (six) hours as needed for moderate pain.    . isosorbide mononitrate (IMDUR) 30 MG 24 hr tablet TAKE 1 TABLET(30 MG) BY MOUTH DAILY 30 tablet 8  . meloxicam (MOBIC) 7.5 MG tablet Take 7.5 mg by mouth 2 (two) times daily as needed for pain (and inflammation).     . Nebulizers (COMPRESSOR NEBULIZER) MISC 1 Device by Does not apply route once. 1 each 0   . nitroGLYCERIN (NITROSTAT) 0.4 MG SL tablet Place 1 tablet (0.4 mg total) under the tongue every 5 (five) minutes as needed for chest pain. 90 tablet 3  . potassium chloride SA (K-DUR,KLOR-CON) 20 MEQ tablet Take 1 tablet (20 mEq total) by mouth daily. 90 tablet 2  . valsartan (DIOVAN) 160 MG tablet Take 1 tablet (160 mg total) by mouth daily. 90 tablet 3   No current facility-administered medications for this visit.     Allergies:   Aspirin; Hydroxyquinolines; Imipramine hcl; Pamelor [nortriptyline hcl]; Prednisone; Procardia [nifedipine]; Tramadol; Ace inhibitors; and Plaquenil [hydroxychloroquine sulfate]    Social History:  The patient  reports that she quit smoking about 55 years ago. Her smoking use included cigarettes. She started smoking about 68 years ago. She has a 12.00 pack-year smoking history. She has never used smokeless tobacco. She reports that she does not drink alcohol or use drugs.   Family History:  The patient's family history includes Bone cancer in her sister; CAD in her brother; Heart attack (age of onset: 43) in her brother; Throat cancer in her daughter.    ROS:  Please see the history of present illness.   Otherwise, review of systems are positive for dyspnea on exertion, chest discomfort.   All other systems are reviewed and negative.    PHYSICAL EXAM: VS:  BP (!) 115/58   Pulse 78   Ht 5\' 4"  (1.626 m)   Wt 207 lb 6.4 oz (94.1 kg)   BMI 35.60 kg/m  , BMI Body mass index is 35.6 kg/m. GEN: Well nourished, well developed, in no acute distress  HEENT: normal  Neck: no JVD, carotid bruits, or masses Cardiac: RRR; no murmurs, rubs, or gallops,no edema  Respiratory:  clear to auscultation bilaterally, normal work of breathing GI: soft, nontender, nondistended, + BS, obese MS: no deformity or atrophy  Skin: warm and dry, no rash Neuro:  Strength and sensation are intact Psych: euthymic mood, full affect   EKG:   The ekg ordered today demonstrates NSR,  occasional PVCs   Recent Labs: 11/03/2017: BUN 20; Creatinine, Ser 1.13; Potassium 5.0; Sodium 138   Lipid Panel No results found for: CHOL, TRIG, HDL, CHOLHDL, VLDL, LDLCALC, LDLDIRECT   Other studies Reviewed: Additional studies/ records that were reviewed today with results demonstrating: Creatinine 1.5 on June 25, 2018.   ASSESSMENT AND PLAN:  1. CAD: Some chest discomfort with some atypical and  some typical features.  It is responsive to nitroglycerin.  We had recommended echocardiogram and chest x-ray in the past, neither of which happened.  We will get both of these tests to evaluate her LV function and any other possible causes of shortness of breath.  If her LV function is dropped or if her nitroglycerin use persists, would consider coronary angiogram in the future.  Will await echo results.  She does have chronic renal insufficiency with her last creatinine being 1.5.  Would have to try to minimize contrast dye. 2. SHOB: May be from deconditioning.  She was doing better when she exercise regularly.  This regular exercise stopped after her fall.  She is trying to get back into low level activity.  I did say that she could pursue this as long as she was not having any chest discomfort. 3. HTN: The current medical regimen is effective;  continue present plan and medications.   Current medicines are reviewed at length with the patient today.  The patient concerns regarding her medicines were addressed.  The following changes have been made:  No change  Labs/ tests ordered today include:  No orders of the defined types were placed in this encounter.   Recommend 150 minutes/week of aerobic exercise Low fat, low carb, high fiber diet recommended  Disposition:   FU in 6 months, or sooner if symptoms persist   Signed, Larae Grooms, MD  07/02/2018 1:51 PM    [facility] [address], [place], [place]  [address] Phone: [phone]; Fax: [phone]

## 2018-07-02 NOTE — Patient Instructions (Signed)
Medication Instructions:  Your physician recommends that you continue on your current medications as directed. Please refer to the Current Medication list given to you today.   Labwork: None ordered  Testing/Procedures: A chest x-ray takes a picture of the organs and structures inside the chest, including the heart, lungs, and blood vessels. This test can show several things, including, whether the heart is enlarges; whether fluid is building up in the lungs; and whether pacemaker / defibrillator leads are still in place.  Your physician has requested that you have an echocardiogram. Echocardiography is a painless test that uses sound waves to create images of your heart. It provides your doctor with information about the size and shape of your heart and how well your heart's chambers and valves are working. This procedure takes approximately one hour. There are no restrictions for this procedure.  Follow-Up: Your physician wants you to follow-up in: April with Dr. Irish Lack. You will receive a reminder letter in the mail two months in advance. If you don't receive a letter, please call our office to schedule the follow-up appointment.   Any Other Special Instructions Will Be Listed Below (If Applicable).  Echocardiogram An echocardiogram, or echocardiography, uses sound waves (ultrasound) to produce an image of your heart. The echocardiogram is simple, painless, obtained within a short period of time, and offers valuable information to your health care provider. The images from an echocardiogram can provide information such as:  Evidence of coronary artery disease (CAD).  Heart size.  Heart muscle function.  Heart valve function.  Aneurysm detection.  Evidence of a past heart attack.  Fluid buildup around the heart.  Heart muscle thickening.  Assess heart valve function.  Tell a health care provider about:  Any allergies you have.  All medicines you are taking, including  vitamins, herbs, eye drops, creams, and over-the-counter medicines.  Any problems you or family members have had with anesthetic medicines.  Any blood disorders you have.  Any surgeries you have had.  Any medical conditions you have.  Whether you are pregnant or may be pregnant. What happens before the procedure? No special preparation is needed. Eat and drink normally. What happens during the procedure?  In order to produce an image of your heart, gel will be applied to your chest and a wand-like tool (transducer) will be moved over your chest. The gel will help transmit the sound waves from the transducer. The sound waves will harmlessly bounce off your heart to allow the heart images to be captured in real-time motion. These images will then be recorded.  You may need an IV to receive a medicine that improves the quality of the pictures. What happens after the procedure? You may return to your normal schedule including diet, activities, and medicines, unless your health care provider tells you otherwise. This information is not intended to replace advice given to you by your health care provider. Make sure you discuss any questions you have with your health care provider. Document Released: 10/11/2000 Document Revised: 06/01/2016 Document Reviewed: 06/21/2013 Elsevier Interactive Patient Education  2017 [employer].   Chest X-Ray A chest X-ray is a painless test that uses radiation to create images of the structures inside of your chest. Chest X-rays are used to look for many health conditions, including heart failure, pneumonia, tuberculosis, rib fractures, breathing disorders, and cancer. They may be used to diagnose chest pain, constant coughing, or trouble breathing. Tell a health care provider about:  Any allergies you have.  All  medicines you are taking, including vitamins, herbs, eye drops, creams, and over-the-counter medicines.  Any surgeries you have had.  Any  medical conditions you have.  Whether you are pregnant or may be pregnant. What are the risks? Getting a chest X-ray is a safe procedure. However, you will be exposed to a small amount of radiation. Being exposed to too much radiation over a lifetime can increase the risk of cancer. This risk is small, but it may occur if you have many X-rays throughout your life. What happens before the procedure?  You may be asked to remove glasses, jewelry, and any other metal objects.  You will be asked to undress from the waist up. You may be given a hospital gown to wear.  You may be asked to wear a protective lead apron to protect parts of your body from radiation. What happens during the procedure?  You will be asked to stand still as each picture is taken to get the best possible images.  You will be asked to take a deep breath and hold your breath for a few seconds.  The X-ray machine will create a picture of your chest using a tiny burst of radiation. This is painless.  More pictures may be taken from other angles. Typically, one picture will be taken while you face the X-ray camera, and another picture will be taken from the side while you stand. If you cannot stand, you may be asked to lie down. The procedure may vary among health care providers and hospitals. What happens after the procedure?  The X-ray(s) will be reviewed by your health care provider or an X-ray (radiology) specialist.  It is up to you to get your test results. Ask your health care provider, or the department that is doing the test, when your results will be ready.  Your health care provider will tell you if you need more tests or a follow-up exam. Keep all follow-up visits as told by your health care provider. This is important. Summary  A chest X-ray is a safe, painless test that is used to examine the inside of the chest, heart, and lungs.  You will need to undress from the waist up and remove jewelry and metal  objects before the procedure.  You will be exposed to a small amount of radiation during the procedure.  The X-ray machine will take one or more pictures of your chest while you remain as still as possible.  Later, a health care provider or specialist will review the test results with you. This information is not intended to replace advice given to you by your health care provider. Make sure you discuss any questions you have with your health care provider. Document Released: 12/10/2016 Document Revised: 12/10/2016 Document Reviewed: 12/10/2016 Elsevier Interactive Patient Education  [employer].    If you need a refill on your cardiac medications before your next appointment, please call your pharmacy.

## 2018-07-06 ENCOUNTER — Other Ambulatory Visit (HOSPITAL_COMMUNITY): Payer: Medicare Other

## 2018-07-08 ENCOUNTER — Ambulatory Visit (HOSPITAL_COMMUNITY): Payer: Medicare Other | Attending: Interventional Cardiology

## 2018-07-08 ENCOUNTER — Other Ambulatory Visit: Payer: Self-pay

## 2018-07-08 DIAGNOSIS — I119 Hypertensive heart disease without heart failure: Secondary | ICD-10-CM | POA: Insufficient documentation

## 2018-07-08 DIAGNOSIS — E669 Obesity, unspecified: Secondary | ICD-10-CM | POA: Insufficient documentation

## 2018-07-08 DIAGNOSIS — R0609 Other forms of dyspnea: Secondary | ICD-10-CM

## 2018-07-08 DIAGNOSIS — I4891 Unspecified atrial fibrillation: Secondary | ICD-10-CM | POA: Insufficient documentation

## 2018-07-08 DIAGNOSIS — R079 Chest pain, unspecified: Secondary | ICD-10-CM | POA: Diagnosis not present

## 2018-07-08 DIAGNOSIS — R0600 Dyspnea, unspecified: Secondary | ICD-10-CM

## 2018-07-08 MED ORDER — PERFLUTREN LIPID MICROSPHERE
1.0000 mL | INTRAVENOUS | Status: AC | PRN
  Administered 2018-07-08: 2 mL via INTRAVENOUS

## 2018-07-09 NOTE — Progress Notes (Signed)
Pt has been made aware of normal result and verbalized understanding.  jw 07/09/18

## 2018-07-15 ENCOUNTER — Telehealth: Payer: Self-pay | Admitting: Pulmonary Disease

## 2018-07-15 ENCOUNTER — Encounter: Payer: Self-pay | Admitting: Primary Care

## 2018-07-15 ENCOUNTER — Ambulatory Visit: Payer: Medicare Other | Admitting: Primary Care

## 2018-07-15 VITALS — BP 140/80 | HR 85 | Ht 60.75 in | Wt 204.4 lb

## 2018-07-15 DIAGNOSIS — J4531 Mild persistent asthma with (acute) exacerbation: Secondary | ICD-10-CM

## 2018-07-15 DIAGNOSIS — R059 Cough, unspecified: Secondary | ICD-10-CM

## 2018-07-15 DIAGNOSIS — G4733 Obstructive sleep apnea (adult) (pediatric): Secondary | ICD-10-CM | POA: Diagnosis not present

## 2018-07-15 DIAGNOSIS — R05 Cough: Secondary | ICD-10-CM

## 2018-07-15 MED ORDER — BUDESONIDE-FORMOTEROL FUMARATE 160-4.5 MCG/ACT IN AERO: 2.0000 | INHALATION_SPRAY | Freq: Two times a day (BID) | RESPIRATORY_TRACT | 2 refills | Status: DC

## 2018-07-15 MED ORDER — BENZONATATE 200 MG PO CAPS: 200.0000 mg | ORAL_CAPSULE | Freq: Three times a day (TID) | ORAL | 1 refills | Status: DC | PRN

## 2018-07-15 NOTE — Progress Notes (Signed)
@Patient  ID: Sheryl Curtis, female    DOB: 22-Feb-1932, 84 y.o.   MRN: [id number]  Chief Complaint  Patient presents with  . Acute Visit    cough with white mucus-SOB on exertion x2 weeks    Referring provider: Alroy Dust, L.Marlou Sa, MD  HPI: 84 year old female, former smoker (quit 1964, 12 pack year history). PMH asthma, OSA, nocturnal hypoxia, allergic rhinitis, afib, CAD, HTN. Patient of Dr. Elsworth Soho, last seen on 05/12/18.    07/15/2018 Patient presents today for acute visit with complaints of cough x 2 weeks. Cough is dry, occasionally productive. Associated wheezing. Symptoms are worse at night. Continues with Symbicort 160, reports that she only uses maintenance inhaler in the morning (prescribed twice daily). She has taken mucinex DM and allergy medication for 4-5 days with no improvemnt. Denies nasal congestion.    Allergies  Allergen Reactions  . Aspirin Anaphylaxis, Hives and Shortness Of Breath  . Hydroxyquinolines Hives  . Imipramine Hcl Other (See Comments)    UNKNOWN TO PATIENT  . Pamelor [Nortriptyline Hcl] Other (See Comments)    unknown  . Prednisone Shortness Of Breath and Itching    anxiety  . Procardia [Nifedipine] Other (See Comments)    Unknown   . Tramadol Swelling  . Ace Inhibitors Other (See Comments)    cough  . Plaquenil [Hydroxychloroquine Sulfate] Rash    Immunization History  Administered Date(s) Administered  . Pneumococcal Conjugate-13 11/23/2013  . Pneumococcal Polysaccharide-23 08/27/2007  . Tdap 08/04/2008  . Zoster Recombinat (Shingrix) 02/19/2017    Past Medical History:  Diagnosis Date  . ALLERGIC RHINITIS   . Atrial fibrillation ([place])   . CANCER, COLORECTAL   . CHEST PAIN, ATYPICAL   . DYSPNEA   . Essential hypertension, benign   . FIBROMYALGIA   . G E R D   . History of stress test    Myoview 8/16:  EF 69%, anterior and apical defect c/w breast attenuation; Low Risk   . INSOMNIA   . Other and unspecified angina pectoris      Tobacco History: Social History   Tobacco Use  Smoking Status Former Smoker  . Packs/day: 1.00  . Years: 12.00  . Pack years: 12.00  . Types: Cigarettes  . Start date: 10/28/1949  . Last attempt to quit: 10/28/1962  . Years since quitting: 55.7  Smokeless Tobacco Never Used   Counseling given: Not Answered   Outpatient Medications Prior to Visit  Medication Sig Dispense Refill  . albuterol (PROVENTIL HFA;VENTOLIN HFA) 108 (90 Base) MCG/ACT inhaler Inhale 2 puffs into the lungs every 6 (six) hours as needed for wheezing or shortness of breath. 1 Inhaler 6  . albuterol (PROVENTIL) (2.5 MG/3ML) 0.083% nebulizer solution 1 vial in neb every 6 hours and as needed Dx J45.909 360 mL 11  . Albuterol Sulfate (PROAIR RESPICLICK) 341 (90 Base) MCG/ACT AEPB Inhale 1-2 puffs into the lungs every 6 (six) hours as needed (for shortness of breath and/or wheezing.). 1 each 6  . Ascorbic Acid (VITAMIN C) 1000 MG tablet Take 1,000 mg by mouth daily.    . budesonide-formoterol (SYMBICORT) 160-4.5 MCG/ACT inhaler Inhale 2 puffs into the lungs 2 (two) times daily.    . budesonide-formoterol (SYMBICORT) 160-4.5 MCG/ACT inhaler Inhale 2 puffs into the lungs 2 (two) times daily. 2 Inhaler 0  . CALCIUM PO Take 1,200 mg by mouth daily.     . cetirizine (ZYRTEC) 10 MG tablet Take 10 mg by mouth daily.    . clopidogrel (  PLAVIX) 75 MG tablet TAKE 1 TABLET(75 MG) BY MOUTH EVERY OTHER DAY 45 tablet 2  . DULoxetine (CYMBALTA) 60 MG capsule Take 60 mg by mouth daily.    . furosemide (LASIX) 40 MG tablet Take 1 tablet (40 mg total) by mouth daily. 90 tablet 2  . isosorbide mononitrate (IMDUR) 30 MG 24 hr tablet TAKE 1 TABLET(30 MG) BY MOUTH DAILY 30 tablet 8  . Nebulizers (COMPRESSOR NEBULIZER) MISC 1 Device by Does not apply route once. 1 each 0  . nitroGLYCERIN (NITROSTAT) 0.4 MG SL tablet Place 1 tablet (0.4 mg total) under the tongue every 5 (five) minutes as needed for chest pain. 90 tablet 3  . potassium  chloride SA (K-DUR,KLOR-CON) 20 MEQ tablet Take 1 tablet (20 mEq total) by mouth daily. 90 tablet 2  . valsartan (DIOVAN) 160 MG tablet Take 1 tablet (160 mg total) by mouth daily. 90 tablet 3   No facility-administered medications prior to visit.     Review of Systems  Review of Systems  Constitutional: Negative.   Respiratory: Positive for cough and wheezing. Negative for shortness of breath.   Cardiovascular: Negative.     Physical Exam  BP 140/80 (BP Location: Right Arm, Cuff Size: Normal)   Pulse 85   Ht 5' 0.75" (1.543 m)   Wt 204 lb 6.4 oz (92.7 kg)   SpO2 96%   BMI 38.94 kg/m  Physical Exam  Constitutional: She is oriented to person, place, and time. She appears well-developed and well-nourished. No distress.  HENT:  Head: Normocephalic and atraumatic.  Eyes: Pupils are equal, round, and reactive to light. EOM are normal.  Neck: Normal range of motion.  Cardiovascular: Normal rate and regular rhythm.  Pulmonary/Chest: Effort normal.  Mild exp wheeze   Neurological: She is alert and oriented to person, place, and time.  Skin: Skin is warm and dry.  Psychiatric: She has a normal mood and affect. Her behavior is normal. Judgment and thought content normal.     Lab Results:  CBC    Component Value Date/Time   WBC 19.4 (H) 09/15/2016 1601   RBC 5.22 (H) 09/15/2016 1601   HGB 16.5 (H) 09/15/2016 1601   HCT 47.7 (H) 09/15/2016 1601   PLT 315 09/15/2016 1601   MCV 91.4 09/15/2016 1601   MCH 31.6 09/15/2016 1601   MCHC 34.6 09/15/2016 1601   RDW 13.6 09/15/2016 1601   LYMPHSABS 2.3 08/03/2016 0011   MONOABS 0.8 08/03/2016 0011   EOSABS 0.2 08/03/2016 0011   BASOSABS 0.0 08/03/2016 0011    BMET    Component Value Date/Time   NA 138 11/03/2017 1046   K 5.0 11/03/2017 1046   CL 101 11/03/2017 1046   CO2 22 11/03/2017 1046   GLUCOSE 115 (H) 11/03/2017 1046   GLUCOSE 136 (H) 09/15/2016 1601   BUN 20 11/03/2017 1046   CREATININE 1.13 (H) 11/03/2017 1046    CALCIUM 9.7 11/03/2017 1046   GFRNONAA 45 (L) 11/03/2017 1046   GFRAA 52 (L) 11/03/2017 1046    BNP    Component Value Date/Time   BNP 82.0 07/01/2015 1653    ProBNP    Component Value Date/Time   PROBNP 28.0 02/23/2014 1222    Imaging: Dg Chest 2 View  Result Date: 07/03/2018 CLINICAL DATA:  [place] onto RIGHT side 5 weeks ago, congested feeling upper LEFT chest, history atrial fibrillation, colon cancer, hypertension EXAM: CHEST - 2 VIEW COMPARISON:  07/23/2017, 07/01/2015 FINDINGS: Normal heart size, mediastinal contours, and pulmonary  vascularity. Atherosclerotic calcification aorta. Minimal chronic accentuation of RIGHT infrahilar markings, stable. No acute infiltrate, pleural effusion or pneumothorax. Bones unremarkable IMPRESSION: No acute abnormalities. Electronically Signed   By: Lavonia Dana M.D.   On: 07/03/2018 08:01   Dg Lumbar Spine 2-3 Views  Result Date: 06/26/2018 CLINICAL DATA:  Fall.  Low back pain.  Pain radiates into legs. EXAM: LUMBAR SPINE - 2-3 VIEW COMPARISON:  CT 09/15/2016. FINDINGS: Thoracolumbar spine scoliosis and diffuse degenerative change. Degenerative changes both hips. No acute bony abnormality identified. No evidence of fracture. Aortoiliac atherosclerotic vascular calcification. IMPRESSION: Thoracic spine scoliosis and degenerative change. Degenerative changes both hips. No acute bony abnormalities identified. Electronically Signed   By: Marcello Moores  Register   On: 06/26/2018 09:27     Assessment & Plan:   G E R D - Acid reflux could be contributing to your cough - Start Prilosec OTC x 2 weeks  - Follow GERD diet as instructed   Mild persistent asthma with (acute) exacerbation - Mild exacerbation - Reported allergy to prednisone, documented in chart  - Instructed patient to take Symbicort TWICE daily  - Albuterol nebulizer q6hrs prn shortness of breath  Cough - Mostly dry, reactive upper airway cough - Start Delsym cough syrup - take twice a  day - Tessalon perles x3 times a day for cough   OSA (obstructive sleep apnea) - Mild OSA, AHI 10 - She has not been wearing cpap, encouraged nightly use        Martyn Ehrich, NP 07/16/2018

## 2018-07-15 NOTE — Telephone Encounter (Signed)
Spoke with the pt  She reports having a cough, esp worse at night over the past couple of weeks  She states cough is keeping her awake  Last ov July 2019 Appt with Beth today at 3:30

## 2018-07-15 NOTE — Patient Instructions (Addendum)
Asthma: - Symbicort 2 puffs twice a day (take everyday no matter how you are feeling) - Albuterol nebulizer as needed for shortness of breath- every 6 hours (you can use twice a day)  Cough: - Start Delsym cough syrup - take twice a day - Tessalon perles x3 times a day for cough   GERD: - Acid reflux could be contributing to your cough, please start Prilosec OTX x 2 weeks  - Make sure to take medication fist thing in the morning, 20-36mins before breakfast  - You can get this new medication over the counter - Follow GERD diet as instructed (avoid coffee, chocolate, spicy and acidic foods and alcohol)  FU with Eustaquio Maize, NP in 2-3 weeks      Food Choices for Gastroesophageal Reflux Disease, Adult When you have gastroesophageal reflux disease (GERD), the foods you eat and your eating habits are very important. Choosing the right foods can help ease your discomfort. What guidelines do I need to follow?  Choose fruits, vegetables, whole grains, and low-fat dairy products.  Choose low-fat meat, fish, and poultry.  Limit fats such as oils, salad dressings, butter, nuts, and avocado.  Keep a food diary. This helps you identify foods that cause symptoms.  Avoid foods that cause symptoms. These may be different for everyone.  Eat small meals often instead of 3 large meals a day.  Eat your meals slowly, in a place where you are relaxed.  Limit fried foods.  Cook foods using methods other than frying.  Avoid drinking alcohol.  Avoid drinking large amounts of liquids with your meals.  Avoid bending over or lying down until 2-3 hours after eating. What foods are not recommended? These are some foods and drinks that may make your symptoms worse: Vegetables Tomatoes. Tomato juice. Tomato and spaghetti sauce. Chili peppers. Onion and garlic. Horseradish. Fruits Oranges, grapefruit, and lemon (fruit and juice). Meats High-fat meats, fish, and poultry. This includes hot dogs, ribs, ham,  sausage, salami, and bacon. Dairy Whole milk and chocolate milk. Sour cream. Cream. Butter. Ice cream. Cream cheese. Drinks Coffee and tea. Bubbly (carbonated) drinks or energy drinks. Condiments Hot sauce. Barbecue sauce. Sweets/Desserts Chocolate and cocoa. Donuts. Peppermint and spearmint. Fats and Oils High-fat foods. This includes [place] fries and potato chips. Other Vinegar. Strong spices. This includes black pepper, white pepper, red pepper, cayenne, curry powder, cloves, ginger, and chili powder. The items listed above may not be a complete list of foods and drinks to avoid. Contact your dietitian for more information. This information is not intended to replace advice given to you by your health care provider. Make sure you discuss any questions you have with your health care provider. Document Released: 04/14/2012 Document Revised: 03/21/2016 Document Reviewed: 08/18/2013 Elsevier Interactive Patient Education  2017 [employer].

## 2018-07-16 ENCOUNTER — Encounter: Payer: Self-pay | Admitting: Primary Care

## 2018-07-16 DIAGNOSIS — J4531 Mild persistent asthma with (acute) exacerbation: Secondary | ICD-10-CM | POA: Insufficient documentation

## 2018-07-16 DIAGNOSIS — R059 Cough, unspecified: Secondary | ICD-10-CM | POA: Insufficient documentation

## 2018-07-16 DIAGNOSIS — R05 Cough: Secondary | ICD-10-CM | POA: Insufficient documentation

## 2018-07-16 NOTE — Assessment & Plan Note (Deleted)
-   Instructed patient to take Symbicort TWICE daily  - Albuterol nebulizer q6hrs prn shortness of breath

## 2018-07-16 NOTE — Assessment & Plan Note (Signed)
-   Mostly dry, reactive upper airway cough - Start Delsym cough syrup - take twice a day - Tessalon perles x3 times a day for cough

## 2018-07-16 NOTE — Assessment & Plan Note (Addendum)
-   Mild exacerbation - Reported allergy to prednisone, documented in chart  - Instructed patient to take Symbicort TWICE daily  - Albuterol nebulizer q6hrs prn shortness of breath

## 2018-07-16 NOTE — Assessment & Plan Note (Signed)
-   Mild OSA, AHI 10 - She has not been wearing cpap, encouraged nightly use

## 2018-07-16 NOTE — Assessment & Plan Note (Deleted)
-   Mild OSA, AHI 10 - She has not been wearing cpap

## 2018-07-16 NOTE — Assessment & Plan Note (Deleted)
-   Mild OSA, AHI 10 - She has not been wearing cpap, encouraged nightly use

## 2018-07-16 NOTE — Assessment & Plan Note (Signed)
-   Acid reflux could be contributing to your cough - Start Prilosec OTC x 2 weeks  - Follow GERD diet as instructed

## 2018-07-22 ENCOUNTER — Encounter: Payer: Self-pay | Admitting: Primary Care

## 2018-07-22 ENCOUNTER — Ambulatory Visit: Payer: Medicare Other | Admitting: Primary Care

## 2018-07-22 ENCOUNTER — Other Ambulatory Visit (INDEPENDENT_AMBULATORY_CARE_PROVIDER_SITE_OTHER): Payer: Medicare Other

## 2018-07-22 VITALS — BP 138/62 | HR 62 | Temp 98.0°F | Ht 60.75 in | Wt 206.2 lb

## 2018-07-22 DIAGNOSIS — J4531 Mild persistent asthma with (acute) exacerbation: Secondary | ICD-10-CM

## 2018-07-22 DIAGNOSIS — R0602 Shortness of breath: Secondary | ICD-10-CM

## 2018-07-22 LAB — BRAIN NATRIURETIC PEPTIDE: Pro B Natriuretic peptide (BNP): 49 pg/mL (ref 0.0–100.0)

## 2018-07-22 LAB — BASIC METABOLIC PANEL
BUN: 23 mg/dL (ref 6–23)
CO2: 29 mEq/L (ref 19–32)
Calcium: 9.9 mg/dL (ref 8.4–10.5)
Chloride: 97 mEq/L (ref 96–112)
Creatinine, Ser: 1.27 mg/dL — ABNORMAL HIGH (ref 0.40–1.20)
GFR: 42.56 mL/min — ABNORMAL LOW (ref >60.00)
Glucose, Bld: 104 mg/dL — ABNORMAL HIGH (ref 70–99)
Potassium: 4.1 mEq/L (ref 3.5–5.1)
Sodium: 135 mEq/L (ref 135–145)

## 2018-07-22 MED ORDER — METHYLPREDNISOLONE ACETATE 80 MG/ML IJ SUSP
80.0000 mg | Freq: Once | INTRAMUSCULAR | Status: AC
  Administered 2018-07-22: 80 mg via INTRAMUSCULAR

## 2018-07-22 MED ORDER — FLUTTER DEVI: 0 refills | Status: AC

## 2018-07-22 MED ORDER — PREDNISONE 10 MG PO TABS: ORAL_TABLET | ORAL | 0 refills | Status: DC

## 2018-07-22 MED ORDER — AZITHROMYCIN 250 MG PO TABS: ORAL_TABLET | ORAL | 0 refills | Status: DC

## 2018-07-22 NOTE — Assessment & Plan Note (Signed)
Continue Symbicort 160 twice daily  Albuterol neb q4-6 hours prn sob/wheeze

## 2018-07-22 NOTE — Assessment & Plan Note (Signed)
Asthma exacerbated by bronchitis symptoms  Coarse exp wheeze on exam Depo-medrol 80mg  IM x1 today Needs zpack and prednisone 20mg  x5 days Given flutter valve  Return in 7-10 days if not better

## 2018-07-22 NOTE — Assessment & Plan Note (Addendum)
Treating for acute asthma exacerbation  BNP normal No signs of anemia, Hgb 13 on most recent CBC

## 2018-07-22 NOTE — Patient Instructions (Addendum)
Office treatments: Depo-medrol 80mg  x1   RX sent to pharmacy: Prednisone 20mg  x 5 days Zpack as directed   Continue Symbicort twice daily Albuterol nebulizer every 4-6 hours as needed for shortness of breath/wheezing  Take mucinex and delsym cough syrup  twice daily x1 week  Labs today please Return if no improvement in 7-10 days

## 2018-07-22 NOTE — Progress Notes (Signed)
@Patient  ID: Sheryl Curtis, female    DOB: 1932/09/16, 84 y.o.   MRN: [id number]  Chief Complaint  Patient presents with  . Acute Visit    wheezing in chest, productive cough w/ white, yellow mucus, head congestion, headache    Referring provider: Alroy Dust, L.Marlou Sa, MD  HPI: 84 year old female, former smoker (quit 1964, 12 pack year history). PMH asthma, OSA, nocturnal hypoxia, allergic rhinitis, afib, CAD, HTN. Patient of Dr. Elsworth Soho, last seen on 05/12/18.    07/15/2018 Patient presents today for acute visit with complaints of cough x 2 weeks. Cough is dry, occasionally productive. Associated wheezing. Symptoms are worse at night. Continues with Symbicort 160, reports that she only uses maintenance inhaler in the morning (prescribed twice daily). She has taken mucinex DM and allergy medication for 4-5 days with no improvemnt. Denies nasal congestion.   07/22/2018 Patient presents today for 1 week follow-up. Complains of fatigue, wheezing and cough. She has been using Symbicort and nebulizer twice a day. States that she is unsure if it helps. She had a lot of wheezing this morning. Not coughing as much but continues to be productive with yellow mucus.Taking Delsym. Patient has documented allergy to prednisone but states that it just makes her jittery/nervous and feels ok taking it. Takes 40mg  lasix daily. Reports weight gain but from eating. Up 2 lbs from last visit. Denies leg swelling. She had a CXR on 09/05 which showed no infiltrate, pleural effusion or pneumothorax. CBC drawn at PCP office, faxed to our office and normal.    Allergies  Allergen Reactions  . Aspirin Anaphylaxis, Hives and Shortness Of Breath  . Hydroxyquinolines Hives  . Imipramine Hcl Other (See Comments)    UNKNOWN TO PATIENT  . Pamelor [Nortriptyline Hcl] Other (See Comments)    unknown  . Prednisone Shortness Of Breath and Itching    anxiety  . Procardia [Nifedipine] Other (See Comments)    Unknown   . Tramadol  Swelling  . Ace Inhibitors Other (See Comments)    cough  . Plaquenil [Hydroxychloroquine Sulfate] Rash    Immunization History  Administered Date(s) Administered  . Pneumococcal Conjugate-13 11/23/2013  . Pneumococcal Polysaccharide-23 08/27/2007  . Tdap 08/04/2008  . Zoster Recombinat (Shingrix) 02/19/2017    Past Medical History:  Diagnosis Date  . ALLERGIC RHINITIS   . Atrial fibrillation ([place])   . CANCER, COLORECTAL   . CHEST PAIN, ATYPICAL   . DYSPNEA   . Essential hypertension, benign   . FIBROMYALGIA   . G E R D   . History of stress test    Myoview 8/16:  EF 69%, anterior and apical defect c/w breast attenuation; Low Risk   . INSOMNIA   . Other and unspecified angina pectoris     Tobacco History: Social History   Tobacco Use  Smoking Status Former Smoker  . Packs/day: 1.00  . Years: 12.00  . Pack years: 12.00  . Types: Cigarettes  . Start date: 10/28/1949  . Last attempt to quit: 10/28/1962  . Years since quitting: 55.7  Smokeless Tobacco Never Used   Counseling given: Not Answered   Outpatient Medications Prior to Visit  Medication Sig Dispense Refill  . albuterol (PROVENTIL HFA;VENTOLIN HFA) 108 (90 Base) MCG/ACT inhaler Inhale 2 puffs into the lungs every 6 (six) hours as needed for wheezing or shortness of breath. 1 Inhaler 6  . albuterol (PROVENTIL) (2.5 MG/3ML) 0.083% nebulizer solution 1 vial in neb every 6 hours and as needed Dx J45.909  360 mL 11  . Albuterol Sulfate (PROAIR RESPICLICK) 607 (90 Base) MCG/ACT AEPB Inhale 1-2 puffs into the lungs every 6 (six) hours as needed (for shortness of breath and/or wheezing.). 1 each 6  . Ascorbic Acid (VITAMIN C) 1000 MG tablet Take 1,000 mg by mouth daily.    . benzonatate (TESSALON) 200 MG capsule Take 1 capsule (200 mg total) by mouth 3 (three) times daily as needed for cough. 30 capsule 1  . budesonide-formoterol (SYMBICORT) 160-4.5 MCG/ACT inhaler Inhale 2 puffs into the lungs 2 (two) times daily. 1  Inhaler 2  . CALCIUM PO Take 1,200 mg by mouth daily.     . cetirizine (ZYRTEC) 10 MG tablet Take 10 mg by mouth daily.    . clopidogrel (PLAVIX) 75 MG tablet TAKE 1 TABLET(75 MG) BY MOUTH EVERY OTHER DAY 45 tablet 2  . DULoxetine (CYMBALTA) 60 MG capsule Take 60 mg by mouth daily.    . furosemide (LASIX) 40 MG tablet Take 1 tablet (40 mg total) by mouth daily. 90 tablet 2  . isosorbide mononitrate (IMDUR) 30 MG 24 hr tablet TAKE 1 TABLET(30 MG) BY MOUTH DAILY 30 tablet 8  . Nebulizers (COMPRESSOR NEBULIZER) MISC 1 Device by Does not apply route once. 1 each 0  . nitroGLYCERIN (NITROSTAT) 0.4 MG SL tablet Place 1 tablet (0.4 mg total) under the tongue every 5 (five) minutes as needed for chest pain. 90 tablet 3  . potassium chloride SA (K-DUR,KLOR-CON) 20 MEQ tablet Take 1 tablet (20 mEq total) by mouth daily. 90 tablet 2  . valsartan (DIOVAN) 160 MG tablet Take 1 tablet (160 mg total) by mouth daily. 90 tablet 3  . budesonide-formoterol (SYMBICORT) 160-4.5 MCG/ACT inhaler Inhale 2 puffs into the lungs 2 (two) times daily.    . budesonide-formoterol (SYMBICORT) 160-4.5 MCG/ACT inhaler Inhale 2 puffs into the lungs 2 (two) times daily. 2 Inhaler 0   No facility-administered medications prior to visit.     Review of Systems  Review of Systems  Constitutional: Positive for fatigue.  HENT: Positive for congestion, postnasal drip and sinus pressure. Negative for sinus pain.   Respiratory: Positive for cough, shortness of breath and wheezing. Negative for apnea, choking, chest tightness and stridor.   Cardiovascular: Negative for chest pain, palpitations and leg swelling.    Physical Exam  BP 138/62 (BP Location: Left Arm, Cuff Size: Normal)   Pulse 62   Temp 98 F (36.7 C) (Oral)   Ht 5' 0.75" (1.543 m)   Wt 206 lb 3.2 oz (93.5 kg)   SpO2 95%   BMI 39.29 kg/m  Physical Exam  Constitutional: She is oriented to person, place, and time. She appears well-developed and well-nourished. No  distress.  HENT:  Head: Normocephalic and atraumatic.  Eyes: Pupils are equal, round, and reactive to light. EOM are normal.  Neck: Normal range of motion.  Cardiovascular: Normal rate and regular rhythm.  Pulmonary/Chest: Effort normal. No respiratory distress. She has wheezes. She has no rales.  Exp wheeze t/o   Neurological: She is alert and oriented to person, place, and time.  Skin: Skin is warm and dry.  Psychiatric: She has a normal mood and affect. Her behavior is normal. Judgment and thought content normal.     Lab Results:  CBC    Component Value Date/Time   WBC 19.4 (H) 09/15/2016 1601   RBC 5.22 (H) 09/15/2016 1601   HGB 16.5 (H) 09/15/2016 1601   HCT 47.7 (H) 09/15/2016 1601   PLT  315 09/15/2016 1601   MCV 91.4 09/15/2016 1601   MCH 31.6 09/15/2016 1601   MCHC 34.6 09/15/2016 1601   RDW 13.6 09/15/2016 1601   LYMPHSABS 2.3 08/03/2016 0011   MONOABS 0.8 08/03/2016 0011   EOSABS 0.2 08/03/2016 0011   BASOSABS 0.0 08/03/2016 0011    BMET    Component Value Date/Time   NA 135 07/22/2018 1555   NA 138 11/03/2017 1046   K 4.1 07/22/2018 1555   CL 97 07/22/2018 1555   CO2 29 07/22/2018 1555   GLUCOSE 104 (H) 07/22/2018 1555   BUN 23 07/22/2018 1555   BUN 20 11/03/2017 1046   CREATININE 1.27 (H) 07/22/2018 1555   CALCIUM 9.9 07/22/2018 1555   GFRNONAA 45 (L) 11/03/2017 1046   GFRAA 52 (L) 11/03/2017 1046    BNP    Component Value Date/Time   BNP 82.0 07/01/2015 1653    ProBNP    Component Value Date/Time   PROBNP 49.0 07/22/2018 1555    Imaging: Dg Chest 2 View  Result Date: 07/03/2018 CLINICAL DATA:  [place] onto RIGHT side 5 weeks ago, congested feeling upper LEFT chest, history atrial fibrillation, colon cancer, hypertension EXAM: CHEST - 2 VIEW COMPARISON:  07/23/2017, 07/01/2015 FINDINGS: Normal heart size, mediastinal contours, and pulmonary vascularity. Atherosclerotic calcification aorta. Minimal chronic accentuation of RIGHT infrahilar  markings, stable. No acute infiltrate, pleural effusion or pneumothorax. Bones unremarkable IMPRESSION: No acute abnormalities. Electronically Signed   By: Lavonia Dana M.D.   On: 07/03/2018 08:01   Dg Lumbar Spine 2-3 Views  Result Date: 06/26/2018 CLINICAL DATA:  Fall.  Low back pain.  Pain radiates into legs. EXAM: LUMBAR SPINE - 2-3 VIEW COMPARISON:  CT 09/15/2016. FINDINGS: Thoracolumbar spine scoliosis and diffuse degenerative change. Degenerative changes both hips. No acute bony abnormality identified. No evidence of fracture. Aortoiliac atherosclerotic vascular calcification. IMPRESSION: Thoracic spine scoliosis and degenerative change. Degenerative changes both hips. No acute bony abnormalities identified. Electronically Signed   By: Marcello Moores  Register   On: 06/26/2018 09:27     Assessment & Plan:   Mild persistent asthma with (acute) exacerbation Asthma exacerbated by bronchitis symptoms  Coarse exp wheeze on exam Depo-medrol 80mg  IM x1 today Needs zpack and prednisone 20mg  x5 days Given flutter valve  Return in 7-10 days if not better    Asthma Continue Symbicort 160 twice daily  Albuterol neb q4-6 hours prn sob/wheeze   DYSPNEA Treating for acute asthma exacerbation  BNP normal No signs of anemia, Hgb 13 on most recent Kinde, NP 07/22/2018

## 2018-07-27 ENCOUNTER — Telehealth: Payer: Self-pay | Admitting: Pulmonary Disease

## 2018-07-27 DIAGNOSIS — R05 Cough: Secondary | ICD-10-CM

## 2018-07-27 DIAGNOSIS — R059 Cough, unspecified: Secondary | ICD-10-CM

## 2018-07-27 MED ORDER — AMOXICILLIN-POT CLAVULANATE 875-125 MG PO TABS: 1.0000 | ORAL_TABLET | Freq: Two times a day (BID) | ORAL | 0 refills | Status: DC

## 2018-07-27 MED ORDER — PREDNISONE 10 MG PO TABS: ORAL_TABLET | ORAL | 0 refills | Status: DC

## 2018-07-27 NOTE — Telephone Encounter (Signed)
Called and spoke to patient. Patient states she is taking symbicort twice a day and the albuterol nebulizer twice a day. She does not have the rescue inhaler. She is using her flutter valve three times a day.  She is already taking mucinex.  Patient states she can come tomorrow for a chest xray and to provide a sputum sample. She also would like prednisone and augmentin called to her pharmacy: [facility] on [address].   Patient set up for appointment for Monday August 03, 2018 at 1445.   Will route to [facility] for orders to be placed.

## 2018-07-27 NOTE — Telephone Encounter (Signed)
Called and spoke to patient, patient states she is increasingly SOB, continues to cough, unable to wear bra due to soreness in chest, states her mucous is yellow and thick. Wheezing and just feels like she is full of mucous.   EW please advise.

## 2018-07-27 NOTE — Telephone Encounter (Signed)
She should have CXR and sputum culture. Needs to take mucinex twice daily and delsym cough syrup. If she has flutter valve use that. Does she have rescue inhaler or nebulizer???  If prednisone helped I can extend prednisone and can send in augmentin.  Should follow up in 5-7 days.

## 2018-07-27 NOTE — Telephone Encounter (Signed)
Done

## 2018-07-28 ENCOUNTER — Other Ambulatory Visit: Payer: Medicare Other

## 2018-07-28 ENCOUNTER — Telehealth: Payer: Self-pay | Admitting: Pulmonary Disease

## 2018-07-28 ENCOUNTER — Ambulatory Visit (INDEPENDENT_AMBULATORY_CARE_PROVIDER_SITE_OTHER)
Admission: RE | Admit: 2018-07-28 | Discharge: 2018-07-28 | Disposition: A | Discharge status: Home / Self Care | Payer: Medicare Other | Source: Ambulatory Visit | Attending: Primary Care | Admitting: Primary Care

## 2018-07-28 DIAGNOSIS — R059 Cough, unspecified: Secondary | ICD-10-CM

## 2018-07-28 DIAGNOSIS — R05 Cough: Secondary | ICD-10-CM

## 2018-07-28 NOTE — Telephone Encounter (Signed)
Spoke with patient who was waiting in the lobby, she was here to pick up sample cups for sputum culture. (see phone note from 9.30.19) sample cups given, patient is going to the lab now to give samples. Nothing further needed.

## 2018-07-29 ENCOUNTER — Telehealth (INDEPENDENT_AMBULATORY_CARE_PROVIDER_SITE_OTHER): Payer: Self-pay | Admitting: Physician Assistant

## 2018-07-29 NOTE — Telephone Encounter (Signed)
Patient called advised her left shoulder is locking up and she want to know what to do until her appointment 10/09/19with Artis Delay. The number to contact patient is [phone]

## 2018-07-30 LAB — RESPIRATORY CULTURE OR RESPIRATORY AND SPUTUM CULTURE
MICRO NUMBER:: 91177501
RESULT:: NORMAL
SPECIMEN QUALITY:: ADEQUATE

## 2018-07-30 NOTE — Telephone Encounter (Signed)
Can we try to work her in with someone else?

## 2018-07-30 NOTE — Telephone Encounter (Signed)
Please advise 

## 2018-07-31 ENCOUNTER — Other Ambulatory Visit: Payer: Self-pay | Admitting: Interventional Cardiology

## 2018-07-31 NOTE — Telephone Encounter (Signed)
Left voicemail for patient to call back to make an appt today if she would like.

## 2018-07-31 NOTE — Telephone Encounter (Signed)
Can you please call patient and work her in today?  Maybe with [facility]?

## 2018-08-03 ENCOUNTER — Ambulatory Visit: Payer: Medicare Other | Admitting: Nurse Practitioner

## 2018-08-03 ENCOUNTER — Encounter: Payer: Self-pay | Admitting: Nurse Practitioner

## 2018-08-03 ENCOUNTER — Other Ambulatory Visit: Payer: Self-pay

## 2018-08-03 ENCOUNTER — Other Ambulatory Visit (INDEPENDENT_AMBULATORY_CARE_PROVIDER_SITE_OTHER): Payer: Medicare Other

## 2018-08-03 VITALS — BP 124/80 | HR 63 | Ht 64.0 in | Wt 202.0 lb

## 2018-08-03 DIAGNOSIS — R0602 Shortness of breath: Secondary | ICD-10-CM

## 2018-08-03 DIAGNOSIS — J4531 Mild persistent asthma with (acute) exacerbation: Secondary | ICD-10-CM | POA: Diagnosis not present

## 2018-08-03 LAB — BASIC METABOLIC PANEL
BUN: 43 mg/dL — ABNORMAL HIGH (ref 6–23)
CO2: 32 mEq/L (ref 19–32)
Calcium: 9.4 mg/dL (ref 8.4–10.5)
Chloride: 96 mEq/L (ref 96–112)
Creatinine, Ser: 1.56 mg/dL — ABNORMAL HIGH (ref 0.40–1.20)
GFR: 33.56 mL/min — ABNORMAL LOW (ref >60.00)
Glucose, Bld: 114 mg/dL — ABNORMAL HIGH (ref 70–99)
Potassium: 4.3 mEq/L (ref 3.5–5.1)
Sodium: 136 mEq/L (ref 135–145)

## 2018-08-03 LAB — CBC WITH DIFFERENTIAL/PLATELET
Basophils Absolute: 0.1 10*3/uL (ref 0.0–0.1)
Basophils Relative: 0.5 % (ref 0.0–3.0)
Eosinophils Absolute: 0.1 10*3/uL (ref 0.0–0.7)
Eosinophils Relative: 0.4 % (ref 0.0–5.0)
HCT: 42.4 % (ref 36.0–46.0)
Hemoglobin: 14.3 g/dL (ref 12.0–15.0)
Lymphocytes Relative: 8.8 % — ABNORMAL LOW (ref 12.0–46.0)
Lymphs Abs: 1.4 10*3/uL (ref 0.7–4.0)
MCHC: 33.6 g/dL (ref 30.0–36.0)
MCV: 93.3 fl (ref 78.0–100.0)
Monocytes Absolute: 1.4 10*3/uL — ABNORMAL HIGH (ref 0.1–1.0)
Monocytes Relative: 9.2 % (ref 3.0–12.0)
Neutro Abs: 12.6 10*3/uL — ABNORMAL HIGH (ref 1.4–7.7)
Neutrophils Relative %: 81.1 % — ABNORMAL HIGH (ref 43.0–77.0)
Platelets: 274 10*3/uL (ref 150.0–400.0)
RBC: 4.54 Mil/uL (ref 3.87–5.11)
RDW: 13.7 % (ref 11.5–15.5)
WBC: 15.5 10*3/uL — ABNORMAL HIGH (ref 4.0–10.5)

## 2018-08-03 MED ORDER — BUDESONIDE-FORMOTEROL FUMARATE 160-4.5 MCG/ACT IN AERO: 2.0000 | INHALATION_SPRAY | Freq: Two times a day (BID) | RESPIRATORY_TRACT | 0 refills | Status: DC

## 2018-08-03 NOTE — Patient Instructions (Addendum)
Will check labs and call with results Get plenty of rest Drink plenty of fluids Follow up with Dr. Elsworth Soho at first available Please go to the ED if you become any weaker

## 2018-08-03 NOTE — Progress Notes (Signed)
@Patient  ID: Sheryl Curtis, female    DOB: 06/07/1932, 84 y.o.   MRN: [id number]  Chief Complaint  Patient presents with  . Follow-up    SOB has gotten better. patient states that she is very weak. trouble eating, feels nauseated after eating.     Referring provider: Alroy Dust, L.Marlou Sa, MD  HPI 84 year old female former smoker with asthma, OSA, nocturnal hypoxia, allergic rhinitis, Afib, CAD, HTN. Patient is followed by Dr. Elsworth Soho.   Tests: Chest x ray 08/04/18 - No active cardiopulmonary disease.  OV 08/03/18 - follow up shortness of breath and fatigue Patient presents for a follow up on asthma exacerbation. She was seen by [facility] on 07/22/18. She was prescribed prednisone and a zpack. She called back to the office on 07/27/18 and was given more prednisone and Augmentin. She states that her shortness of breath is much better/resolved. She complains today of nausea and extreme fatigue. She states that her stools have been dark. She denies any vomiting or fever. She has 2 days left on antibiotics. She states that she has been staying well hydrated and that her appetite has not changed.     Allergies  Allergen Reactions  . Aspirin Anaphylaxis, Hives and Shortness Of Breath  . Hydroxyquinolines Hives  . Imipramine Hcl Other (See Comments)    UNKNOWN TO PATIENT  . Pamelor [Nortriptyline Hcl] Other (See Comments)    unknown  . Prednisone Shortness Of Breath and Itching    anxiety  . Procardia [Nifedipine] Other (See Comments)    Unknown   . Tramadol Swelling  . Ace Inhibitors Other (See Comments)    cough  . Plaquenil [Hydroxychloroquine Sulfate] Rash    Immunization History  Administered Date(s) Administered  . Pneumococcal Conjugate-13 11/23/2013  . Pneumococcal Polysaccharide-23 08/27/2007  . Tdap 08/04/2008  . Zoster Recombinat (Shingrix) 02/19/2017    Past Medical History:  Diagnosis Date  . ALLERGIC RHINITIS   . Atrial fibrillation ([place])   . CANCER, COLORECTAL   . CHEST  PAIN, ATYPICAL   . DYSPNEA   . Essential hypertension, benign   . FIBROMYALGIA   . G E R D   . History of stress test    Myoview 8/16:  EF 69%, anterior and apical defect c/w breast attenuation; Low Risk   . INSOMNIA   . Other and unspecified angina pectoris     Tobacco History: Social History   Tobacco Use  Smoking Status Former Smoker  . Packs/day: 1.00  . Years: 12.00  . Pack years: 12.00  . Types: Cigarettes  . Start date: 10/28/1949  . Last attempt to quit: 10/28/1962  . Years since quitting: 55.8  Smokeless Tobacco Never Used   Counseling given: Yes   Outpatient Encounter Medications as of 08/03/2018  Medication Sig  . albuterol (PROVENTIL HFA;VENTOLIN HFA) 108 (90 Base) MCG/ACT inhaler Inhale 2 puffs into the lungs every 6 (six) hours as needed for wheezing or shortness of breath.  Marland Kitchen albuterol (PROVENTIL) (2.5 MG/3ML) 0.083% nebulizer solution 1 vial in neb every 6 hours and as needed Dx J45.909  . Albuterol Sulfate (PROAIR RESPICLICK) 956 (90 Base) MCG/ACT AEPB Inhale 1-2 puffs into the lungs every 6 (six) hours as needed (for shortness of breath and/or wheezing.).  Marland Kitchen Ascorbic Acid (VITAMIN C) 1000 MG tablet Take 1,000 mg by mouth daily.  . benzonatate (TESSALON) 200 MG capsule Take 1 capsule (200 mg total) by mouth 3 (three) times daily as needed for cough.  . budesonide-formoterol (SYMBICORT) 160-4.5  MCG/ACT inhaler Inhale 2 puffs into the lungs 2 (two) times daily.  Marland Kitchen CALCIUM PO Take 1,200 mg by mouth daily.   . cetirizine (ZYRTEC) 10 MG tablet Take 10 mg by mouth daily.  . clopidogrel (PLAVIX) 75 MG tablet TAKE 1 TABLET(75 MG) BY MOUTH EVERY OTHER DAY  . DULoxetine (CYMBALTA) 60 MG capsule Take 60 mg by mouth daily.  . furosemide (LASIX) 40 MG tablet Take 1 tablet (40 mg total) by mouth daily.  . isosorbide mononitrate (IMDUR) 30 MG 24 hr tablet TAKE 1 TABLET(30 MG) BY MOUTH DAILY  . Nebulizers (COMPRESSOR NEBULIZER) MISC 1 Device by Does not apply route once.  .  nitroGLYCERIN (NITROSTAT) 0.4 MG SL tablet Place 1 tablet (0.4 mg total) under the tongue every 5 (five) minutes as needed for chest pain.  . potassium chloride SA (K-DUR,KLOR-CON) 20 MEQ tablet TAKE 1 TABLET BY MOUTH  DAILY  . predniSONE (DELTASONE) 10 MG tablet Take 3 tabs x 3 days; 2 tabs x 3 days; 1 tab x 3 days  . Respiratory Therapy Supplies (FLUTTER) DEVI Use flutter device 3 times a day  . valsartan (DIOVAN) 160 MG tablet Take 1 tablet (160 mg total) by mouth daily.  . [DISCONTINUED] amoxicillin-clavulanate (AUGMENTIN) 875-125 MG tablet Take 1 tablet by mouth 2 (two) times daily.  . [DISCONTINUED] azithromycin (ZITHROMAX) 250 MG tablet Zpack taper as directed  . budesonide-formoterol (SYMBICORT) 160-4.5 MCG/ACT inhaler Inhale 2 puffs into the lungs 2 (two) times daily.   No facility-administered encounter medications on file as of 08/03/2018.      Review of Systems  Review of Systems  Constitutional: Positive for activity change (generalized fatigue). Negative for chills and fever.  HENT: Negative.  Negative for congestion, sinus pressure and sinus pain.   Respiratory: Negative for cough and shortness of breath.   Cardiovascular: Negative.  Negative for chest pain, palpitations and leg swelling.  Gastrointestinal: Negative.   Allergic/Immunologic: Negative.   Neurological: Negative.   Psychiatric/Behavioral: Negative.        Physical Exam  BP 124/80 (BP Location: Left Arm, Cuff Size: Normal)   Pulse 63   Ht 5\' 4"  (1.626 m)   Wt 202 lb (91.6 kg)   SpO2 96%   BMI 34.67 kg/m   Wt Readings from Last 5 Encounters:  08/03/18 202 lb (91.6 kg)  07/22/18 206 lb 3.2 oz (93.5 kg)  07/15/18 204 lb 6.4 oz (92.7 kg)  07/02/18 207 lb 6.4 oz (94.1 kg)  05/12/18 205 lb 9.6 oz (93.3 kg)     Physical Exam  Constitutional: She is oriented to person, place, and time. She appears well-developed and well-nourished. No distress.  Cardiovascular: Normal rate and regular rhythm.    Pulmonary/Chest: Effort normal and breath sounds normal. No respiratory distress. She has no wheezes.  Neurological: She is alert and oriented to person, place, and time.  Psychiatric: She has a normal mood and affect.  Nursing note and vitals reviewed.   Imaging: Dg Chest 2 View  Result Date: 07/28/2018 CLINICAL DATA:  Cough, congestion, shortness of Breath EXAM: CHEST - 2 VIEW COMPARISON:  07/02/2018 FINDINGS: Heart and mediastinal contours are within normal limits. No focal opacities or effusions. No acute bony abnormality. Aortic atherosclerotic calcifications. IMPRESSION: No active cardiopulmonary disease. Electronically Signed   By: Rolm Baptise M.D.   On: 07/28/2018 09:25     Assessment & Plan:   Mild persistent asthma with (acute) exacerbation Patient Instructions  Will check labs and call with results Get plenty  of rest Drink plenty of fluids Follow up with Dr. Elsworth Soho at first available Please go to the ED if you become any weaker   Discussion: May need more time to regain strength. I advised patient that she could go ahead and stop any antibiotics or prednisone that she has left due to GI upset. Will check HGB. Stay well hydrated and get plenty of rest.     Fenton Foy, NP 08/04/2018

## 2018-08-04 ENCOUNTER — Ambulatory Visit (INDEPENDENT_AMBULATORY_CARE_PROVIDER_SITE_OTHER): Payer: Medicare Other | Admitting: Orthopaedic Surgery

## 2018-08-04 ENCOUNTER — Encounter: Payer: Self-pay | Admitting: Nurse Practitioner

## 2018-08-04 NOTE — Assessment & Plan Note (Signed)
Patient Instructions  Will check labs and call with results Get plenty of rest Drink plenty of fluids Follow up with Dr. Elsworth Soho at first available Please go to the ED if you become any weaker

## 2018-08-05 ENCOUNTER — Ambulatory Visit (INDEPENDENT_AMBULATORY_CARE_PROVIDER_SITE_OTHER): Payer: Medicare Other | Admitting: Physician Assistant

## 2018-08-05 ENCOUNTER — Ambulatory Visit: Payer: Medicare Other | Admitting: Primary Care

## 2018-09-03 ENCOUNTER — Telehealth: Payer: Self-pay | Admitting: Family Medicine

## 2018-09-03 NOTE — Telephone Encounter (Signed)
Records received from [facility]. Included are notes, labs, immunizations, eye exam, bone density and xrays.  Records also received from [facility]. Included are notes. Sending back for review.

## 2018-09-09 ENCOUNTER — Ambulatory Visit: Payer: Medicare Other | Admitting: Pulmonary Disease

## 2018-09-12 ENCOUNTER — Other Ambulatory Visit: Payer: Self-pay | Admitting: Pulmonary Disease

## 2018-09-28 ENCOUNTER — Ambulatory Visit: Payer: Medicare Other | Admitting: Interventional Cardiology

## 2018-09-30 ENCOUNTER — Telehealth: Payer: Self-pay | Admitting: Pulmonary Disease

## 2018-09-30 MED ORDER — AZITHROMYCIN 250 MG PO TABS: ORAL_TABLET | ORAL | 0 refills | Status: DC

## 2018-09-30 NOTE — Telephone Encounter (Signed)
Z pak

## 2018-09-30 NOTE — Telephone Encounter (Signed)
Called pt letting her know that RA said to send abx of zpak to her pharmacy. I stated to pt after this abx,if she was no better that she would need to come in for an appt. Pt expressed understanding.  I verified pt's preferred pharmacy and sent abx in for pt. Nothing further needed.

## 2018-09-30 NOTE — Telephone Encounter (Signed)
Called and spoke with pt who states she believes she might have a sinus infection. Pt states she has been coughing and also has a headache and also has pressure in her sinuses. Pt states she hurts from her cheekbone up.  Pt states she is coughing up mucus and at times the mucus is yellow and states she does have some postnasal drainage which is clear.  Pt denies any fever.  Pt would like to have something prescribed to help with her symptoms. Dr. Elsworth Soho, please advise on this for pt. Thanks!

## 2018-10-06 ENCOUNTER — Telehealth: Payer: Self-pay | Admitting: Pulmonary Disease

## 2018-10-06 ENCOUNTER — Encounter: Payer: Self-pay | Admitting: Primary Care

## 2018-10-06 ENCOUNTER — Ambulatory Visit: Payer: Medicare Other | Admitting: Primary Care

## 2018-10-06 ENCOUNTER — Telehealth: Payer: Self-pay | Admitting: Primary Care

## 2018-10-06 ENCOUNTER — Ambulatory Visit (INDEPENDENT_AMBULATORY_CARE_PROVIDER_SITE_OTHER)
Admission: RE | Admit: 2018-10-06 | Discharge: 2018-10-06 | Disposition: A | Discharge status: Home / Self Care | Payer: Medicare Other | Source: Ambulatory Visit | Attending: Primary Care | Admitting: Primary Care

## 2018-10-06 VITALS — BP 154/68 | HR 82 | Temp 98.3°F | Ht 64.0 in | Wt 208.6 lb

## 2018-10-06 DIAGNOSIS — J4531 Mild persistent asthma with (acute) exacerbation: Secondary | ICD-10-CM

## 2018-10-06 DIAGNOSIS — J0101 Acute recurrent maxillary sinusitis: Secondary | ICD-10-CM | POA: Diagnosis not present

## 2018-10-06 MED ORDER — AMOXICILLIN-POT CLAVULANATE 875-125 MG PO TABS: 1.0000 | ORAL_TABLET | Freq: Two times a day (BID) | ORAL | 0 refills | Status: DC

## 2018-10-06 MED ORDER — IPRATROPIUM BROMIDE 0.03 % NA SOLN: 2.0000 | Freq: Two times a day (BID) | NASAL | 1 refills | Status: DC

## 2018-10-06 NOTE — Patient Instructions (Addendum)
Chronic Rhinitis: Stop flonase Start Ipratropium nasal spray daily (rx sent to pharm) Start zyretec daily Nasal sinus rinse daily as needed for congestion   Acute sinusitis: RX antibiotic sent to pharmacy  Warm compresses to sinuses Tylenol 650mg  every 8 hours as needed for headache   Asthma/copd exacerbation: Continue Symbicort two puffs twice daily   Orders: CXR today   Referral: ENT for chronic sinuses symptoms  Follow-up: Dr. Elsworth Soho in 2-3 months with full PFTs prior please

## 2018-10-06 NOTE — Telephone Encounter (Signed)
Called and spoke with patient she is still not feeling well after finishing zpak. Patient has been scheduled with BW for today. Nothing further needed.

## 2018-10-06 NOTE — Telephone Encounter (Signed)
Needs referral to ENT re: chronic sinusitis

## 2018-10-06 NOTE — Progress Notes (Signed)
@Patient  ID: Sheryl Curtis, female    DOB: Jan 12, 1932, 84 y.o.   MRN: [id number]  Chief Complaint  Patient presents with  . Acute Visit    SOB with exertion, cough with yellow mucusdizzy sometimes, sinus pressure    Referring provider: Alroy Dust, L.Marlou Sa, MD  HPI: 84 year old female, former smoker (quit 1964, 12 pack year history). PMH asthma, OSA, nocturnal hypoxia, allergic rhinitis, afib, CAD, HTN. Patient of Dr. Elsworth Soho, last seen by Pulmonary NP on 07/31/18.  10/07/2018 Present today for acute visit with complaints of still not feeling well. Accompanied by family friend. Called office on 12/4 with complaints of sinus infection, she was given Zpack over the phone. Reports that she has had sinus congestin for 3 months, states that it got better with abx but never fully resolved. Complains of sinus pressure from her cheek bones up, headache and yellow mucus. States that her lungs feel congested. Associated some sob and wheezing. She has been taking Claritin and Flonase nasal spray.     Allergies  Allergen Reactions  . Aspirin Anaphylaxis, Hives and Shortness Of Breath  . Hydroxyquinolines Hives  . Imipramine Hcl Other (See Comments)    UNKNOWN TO PATIENT  . Pamelor [Nortriptyline Hcl] Other (See Comments)    unknown  . Prednisone Shortness Of Breath and Itching    anxiety  . Procardia [Nifedipine] Other (See Comments)    Unknown   . Tramadol Swelling  . Ace Inhibitors Other (See Comments)    cough  . Plaquenil [Hydroxychloroquine Sulfate] Rash    Immunization History  Administered Date(s) Administered  . Pneumococcal Conjugate-13 11/23/2013  . Pneumococcal Polysaccharide-23 08/27/2007  . Tdap 08/04/2008  . Zoster Recombinat (Shingrix) 02/19/2017    Past Medical History:  Diagnosis Date  . ALLERGIC RHINITIS   . Atrial fibrillation ([place])   . CANCER, COLORECTAL   . CHEST PAIN, ATYPICAL   . DYSPNEA   . Essential hypertension, benign   . FIBROMYALGIA   . G E R D   .  History of stress test    Myoview 8/16:  EF 69%, anterior and apical defect c/w breast attenuation; Low Risk   . INSOMNIA   . Other and unspecified angina pectoris     Tobacco History: Social History   Tobacco Use  Smoking Status Former Smoker  . Packs/day: 1.00  . Years: 12.00  . Pack years: 12.00  . Types: Cigarettes  . Start date: 10/28/1949  . Last attempt to quit: 10/28/1962  . Years since quitting: 55.9  Smokeless Tobacco Never Used   Counseling given: Not Answered   Outpatient Medications Prior to Visit  Medication Sig Dispense Refill  . albuterol (PROVENTIL HFA;VENTOLIN HFA) 108 (90 Base) MCG/ACT inhaler Inhale 2 puffs into the lungs every 6 (six) hours as needed for wheezing or shortness of breath. 1 Inhaler 6  . albuterol (PROVENTIL) (2.5 MG/3ML) 0.083% nebulizer solution USE 1 VIAL VIA NEBULIZER EVERY 6 HOURS AND AS NEEDED 360 mL 0  . Albuterol Sulfate (PROAIR RESPICLICK) 203 (90 Base) MCG/ACT AEPB Inhale 1-2 puffs into the lungs every 6 (six) hours as needed (for shortness of breath and/or wheezing.). 1 each 6  . Ascorbic Acid (VITAMIN C) 1000 MG tablet Take 1,000 mg by mouth daily.    . budesonide-formoterol (SYMBICORT) 160-4.5 MCG/ACT inhaler Inhale 2 puffs into the lungs 2 (two) times daily. 1 Inhaler 2  . budesonide-formoterol (SYMBICORT) 160-4.5 MCG/ACT inhaler Inhale 2 puffs into the lungs 2 (two) times daily. 2 Inhaler 0  .  CALCIUM PO Take 1,200 mg by mouth daily.     . clopidogrel (PLAVIX) 75 MG tablet TAKE 1 TABLET(75 MG) BY MOUTH EVERY OTHER DAY 45 tablet 2  . DULoxetine (CYMBALTA) 60 MG capsule Take 60 mg by mouth daily.    . furosemide (LASIX) 40 MG tablet Take 1 tablet (40 mg total) by mouth daily. 90 tablet 2  . isosorbide mononitrate (IMDUR) 30 MG 24 hr tablet TAKE 1 TABLET(30 MG) BY MOUTH DAILY 30 tablet 8  . Nebulizers (COMPRESSOR NEBULIZER) MISC 1 Device by Does not apply route once. 1 each 0  . nitroGLYCERIN (NITROSTAT) 0.4 MG SL tablet Place 1 tablet  (0.4 mg total) under the tongue every 5 (five) minutes as needed for chest pain. 90 tablet 3  . potassium chloride SA (K-DUR,KLOR-CON) 20 MEQ tablet TAKE 1 TABLET BY MOUTH  DAILY 90 tablet 3  . Respiratory Therapy Supplies (FLUTTER) DEVI Use flutter device 3 times a day 1 each 0  . valsartan (DIOVAN) 160 MG tablet Take 1 tablet (160 mg total) by mouth daily. 90 tablet 3  . azithromycin (ZITHROMAX) 250 MG tablet Take two today and then one daily until gone. 6 tablet 0  . benzonatate (TESSALON) 200 MG capsule Take 1 capsule (200 mg total) by mouth 3 (three) times daily as needed for cough. 30 capsule 1  . predniSONE (DELTASONE) 10 MG tablet Take 3 tabs x 3 days; 2 tabs x 3 days; 1 tab x 3 days 18 tablet 0  . cetirizine (ZYRTEC) 10 MG tablet Take 10 mg by mouth daily.     No facility-administered medications prior to visit.     Review of Systems  Review of Systems  Constitutional: Negative.   HENT: Positive for congestion, postnasal drip, sinus pressure and sinus pain.   Respiratory: Positive for cough and shortness of breath.   Cardiovascular: Negative.     Physical Exam  BP (!) 154/68 (BP Location: Left Arm, Cuff Size: Normal)   Pulse 82   Temp 98.3 F (36.8 C)   Ht 5\' 4"  (1.626 m)   Wt 208 lb 9.6 oz (94.6 kg)   SpO2 97%   BMI 35.81 kg/m  Physical Exam  Constitutional: She is oriented to person, place, and time. She appears well-developed and well-nourished.  HENT:  Head: Normocephalic and atraumatic.  Nose: Right sinus exhibits maxillary sinus tenderness. Left sinus exhibits maxillary sinus tenderness.  Mouth/Throat: Uvula is midline, oropharynx is clear and moist and mucous membranes are normal.  Eyes: Pupils are equal, round, and reactive to light. EOM are normal.  Cardiovascular: Normal rate and regular rhythm.  Pulmonary/Chest: Effort normal and breath sounds normal.  Musculoskeletal: Normal range of motion.  Neurological: She is alert and oriented to person, place, and  time.  Skin: Skin is warm and dry.  Psychiatric: She has a normal mood and affect. Her behavior is normal. Judgment and thought content normal.     Lab Results:  CBC    Component Value Date/Time   WBC 15.5 (H) 08/03/2018 1545   RBC 4.54 08/03/2018 1545   HGB 14.3 08/03/2018 1545   HCT 42.4 08/03/2018 1545   PLT 274.0 08/03/2018 1545   MCV 93.3 08/03/2018 1545   MCH 31.6 09/15/2016 1601   MCHC 33.6 08/03/2018 1545   RDW 13.7 08/03/2018 1545   LYMPHSABS 1.4 08/03/2018 1545   MONOABS 1.4 (H) 08/03/2018 1545   EOSABS 0.1 08/03/2018 1545   BASOSABS 0.1 08/03/2018 1545    BMET  Component Value Date/Time   NA 136 08/03/2018 1545   NA 138 11/03/2017 1046   K 4.3 08/03/2018 1545   CL 96 08/03/2018 1545   CO2 32 08/03/2018 1545   GLUCOSE 114 (H) 08/03/2018 1545   BUN 43 (H) 08/03/2018 1545   BUN 20 11/03/2017 1046   CREATININE 1.56 (H) 08/03/2018 1545   CALCIUM 9.4 08/03/2018 1545   GFRNONAA 45 (L) 11/03/2017 1046   GFRAA 52 (L) 11/03/2017 1046    BNP    Component Value Date/Time   BNP 82.0 07/01/2015 1653    ProBNP    Component Value Date/Time   PROBNP 49.0 07/22/2018 1555    Imaging: Dg Chest 2 View  Result Date: 10/07/2018 CLINICAL DATA:  Shortness of breath. Mild persistent asthma with acute exacerbation. EXAM: CHEST - 2 VIEW COMPARISON:  07/28/2018 FINDINGS: The heart size and mediastinal contours are within normal limits. No infiltrates or effusions. Slight chronic accentuation of the interstitial markings. Aortic atherosclerosis. The visualized skeletal structures are unremarkable. IMPRESSION: No active cardiopulmonary disease. Electronically Signed   By: Lorriane Shire M.D.   On: 10/07/2018 08:07     Assessment & Plan:   Sinusitis, acute maxillary - RX Augmentin 1 tab BID x 7 days  - Start Ipratropium nasal spray  - Start zyretec daily - Nasal sinus rinse daily as needed for congestion  - Warm compresses to sinuses - Tylenol 650mg  every 8 hours as  needed for headache  - Refer to ENT    Asthma - Patient was a former smoker, 12 pack year hx - PFTs in 2018 showed minimal airway obstruction consistent with small airway disease - FU in 2-3 months with repeat PFTs d/t ongoing dyspnea symptoms    Martyn Ehrich, NP 10/07/2018

## 2018-10-07 ENCOUNTER — Other Ambulatory Visit: Payer: Self-pay

## 2018-10-07 DIAGNOSIS — J329 Chronic sinusitis, unspecified: Secondary | ICD-10-CM

## 2018-10-07 DIAGNOSIS — J0100 Acute maxillary sinusitis, unspecified: Secondary | ICD-10-CM | POA: Insufficient documentation

## 2018-10-07 NOTE — Assessment & Plan Note (Signed)
-   Patient was a former smoker, 12 pack year hx - PFTs in 2018 showed minimal airway obstruction consistent with small airway disease - FU in 2-3 months with repeat PFTs d/t ongoing dyspnea symptoms

## 2018-10-07 NOTE — Progress Notes (Signed)
Referral

## 2018-10-07 NOTE — Telephone Encounter (Signed)
Referral in

## 2018-10-07 NOTE — Assessment & Plan Note (Addendum)
-   RX Augmentin 1 tab BID x 7 days  - Start Ipratropium nasal spray  - Start zyretec daily - Nasal sinus rinse daily as needed for congestion  - Warm compresses to sinuses - Tylenol 650mg  every 8 hours as needed for headache  - Refer to ENT

## 2018-10-16 ENCOUNTER — Other Ambulatory Visit: Payer: Self-pay | Admitting: Interventional Cardiology

## 2018-10-28 NOTE — Progress Notes (Signed)
@Patient  ID: Sheryl Curtis, female    DOB: April 25, 1932, 85 y.o.   MRN: [id number]  Chief Complaint  Patient presents with  . Acute Visit    Shortness of breath    Referring provider: Alroy Dust, L.Marlou Sa, MD  HPI:  85 year old female former smoker followed in our office for asthma, obstructive sleep apnea, nocturnal hypoxia, allergic rhinitis  PMH: Allergic rhinitis, A. fib, CAD, hypertension Smoker/ Smoking History: Former smoker.  Quit 1964.  12-pack-year smoking history. Maintenance: Symbicort 160 Pt of: Dr. Elsworth Soho  10/29/2018  - Visit   85 year old female former smoker presenting to our office today for acute visit.  Patient reports increased shortness of breath and fatigue.  Patient reports she has not used her CPAP in last 6 months with Dr. Elsworth Soho is aware.  Patient reports she believes her fatigue and symptoms have worsened over the last 6 months.  Patient has also had multiple visits to our office for treatment of sinusitis-like symptoms.  Patient was referred to an ENT who agreed that this was sinusitis.  Unfortunately the patient has not followed up with ENT as well as not followed the plan of care the ENT as outlined.  Patient is not doing nasal saline rinses, patient is not doing Flonase daily, patient is not on daily antihistamine.  Patient does not have scheduled follow-up for ENT.  Patient is scheduled in February/2020 with pulmonary function test as well as appointment Dr. Elsworth Soho.  Patient presenting today because she wants to feel better, she wants to have improved breathing, patient is concerned because she feels like nothing is making her feel better.  Patient is not very active and leads a sedentary lifestyle.  Patient is on a fluid pill which she reports adherence to but she does not weigh herself daily.  Patient reports she feels like her weight is up a little bit.  Patient does have increased shortness of breath when laying flat.      Tests:   CT chest 03/2017 -stable  multiple pulmonary nodules since 2015  Spirometry in 05/2010 showed FEV1 of 76% improvement postbronchodilator to 80%-1.60 with a ratio of 75 suggesting mild restriction. Spirometry 03/2014 FEV1 of 1.25-64%, with ratio 65 and FVC of 1.91-72%.  PFTs - 12/2014 - no airway obstruction, ratio 71, pos BD response 06/2015 Admit for UTI and Sepsis   NPSG >> AHI 10/h corrected by CPAP 10 cm  FENO:  No results found for: NITRICOXIDE  PFT: PFT Results Latest Ref Rng & Units 03/17/2017 01/10/2015  FVC-Pre L 1.98 1.97  FVC-Predicted Pre % 91 86  FVC-Post L 2.00 2.31  FVC-Predicted Post % 91 101  Pre FEV1/FVC % % 70 71  Post FEV1/FCV % % 76 71  FEV1-Pre L 1.40 1.41  FEV1-Predicted Pre % 87 84  FEV1-Post L 1.52 1.64  DLCO UNC% % 98 94  DLCO COR %Predicted % 118 100  TLC L 4.73 4.87  TLC % Predicted % 101 104  RV % Predicted % 117 116    Imaging: Dg Chest 2 View  Result Date: 10/07/2018 CLINICAL DATA:  Shortness of breath. Mild persistent asthma with acute exacerbation. EXAM: CHEST - 2 VIEW COMPARISON:  07/28/2018 FINDINGS: The heart size and mediastinal contours are within normal limits. No infiltrates or effusions. Slight chronic accentuation of the interstitial markings. Aortic atherosclerosis. The visualized skeletal structures are unremarkable. IMPRESSION: No active cardiopulmonary disease. Electronically Signed   By: Lorriane Shire M.D.   On: 10/07/2018 08:07  Specialty Problems      Pulmonary Problems   Allergic rhinitis    Qualifier: Diagnosis of  By: Lamonte Sakai MD, Rose Fillers       DYSPNEA           Pulmonary nodules/lesions, multiple    Noted 03/2014 on CT abd -stable 12/2014, stable4/2016 except for new 46mm RLL >repeat 1 yr ~01/2017       CAFL (chronic airflow limitation) ([facility])   Asthma   Nocturnal hypoxia    Per O&O 02/2017      OSA (obstructive sleep apnea)    Mild AHI 10/h CPAP 10 cm      Cough   Mild persistent asthma with (acute) exacerbation    Sinusitis, acute maxillary      Allergies  Allergen Reactions  . Aspirin Anaphylaxis, Hives and Shortness Of Breath  . Hydroxyquinolines Hives  . Imipramine Hcl Other (See Comments)    UNKNOWN TO PATIENT  . Pamelor [Nortriptyline Hcl] Other (See Comments)    unknown  . Prednisone Shortness Of Breath and Itching    anxiety  . Procardia [Nifedipine] Other (See Comments)    Unknown   . Tramadol Swelling  . Ace Inhibitors Other (See Comments)    cough  . Plaquenil [Hydroxychloroquine Sulfate] Rash    Immunization History  Administered Date(s) Administered  . Pneumococcal Conjugate-13 11/23/2013  . Pneumococcal Polysaccharide-23 08/27/2007  . Tdap 08/04/2008  . Zoster Recombinat (Shingrix) 02/19/2017    Past Medical History:  Diagnosis Date  . ALLERGIC RHINITIS   . Atrial fibrillation ([place])   . CANCER, COLORECTAL   . CHEST PAIN, ATYPICAL   . DYSPNEA   . Essential hypertension, benign   . FIBROMYALGIA   . G E R D   . History of stress test    Myoview 8/16:  EF 69%, anterior and apical defect c/w breast attenuation; Low Risk   . INSOMNIA   . Other and unspecified angina pectoris     Tobacco History: Social History   Tobacco Use  Smoking Status Former Smoker  . Packs/day: 1.00  . Years: 12.00  . Pack years: 12.00  . Types: Cigarettes  . Start date: 10/28/1949  . Last attempt to quit: 10/28/1962  . Years since quitting: 56.0  Smokeless Tobacco Never Used   Counseling given: Yes  Continue to not smoke.  Outpatient Encounter Medications as of 10/29/2018  Medication Sig  . albuterol (PROVENTIL) (2.5 MG/3ML) 0.083% nebulizer solution USE 1 VIAL VIA NEBULIZER EVERY 6 HOURS AND AS NEEDED  . Albuterol Sulfate (PROAIR RESPICLICK) 518 (90 Base) MCG/ACT AEPB Inhale 1-2 puffs into the lungs every 6 (six) hours as needed (for shortness of breath and/or wheezing.).  Marland Kitchen Ascorbic Acid (VITAMIN C) 1000 MG tablet Take 1,000 mg by mouth daily.  . budesonide-formoterol (SYMBICORT)  160-4.5 MCG/ACT inhaler Inhale 2 puffs into the lungs 2 (two) times daily.  Marland Kitchen CALCIUM PO Take 1,200 mg by mouth daily.   . cetirizine (ZYRTEC) 10 MG tablet Take 10 mg by mouth daily.  . clopidogrel (PLAVIX) 75 MG tablet TAKE 1 TABLET(75 MG) BY MOUTH EVERY OTHER DAY  . DULoxetine (CYMBALTA) 60 MG capsule Take 60 mg by mouth daily.  . furosemide (LASIX) 40 MG tablet TAKE 1 TABLET BY MOUTH  DAILY  . ipratropium (ATROVENT) 0.03 % nasal spray Place 2 sprays into both nostrils every 12 (twelve) hours.  . isosorbide mononitrate (IMDUR) 30 MG 24 hr tablet TAKE 1 TABLET(30 MG) BY MOUTH DAILY  . Nebulizers (COMPRESSOR  NEBULIZER) MISC 1 Device by Does not apply route once.  . nitroGLYCERIN (NITROSTAT) 0.4 MG SL tablet Place 1 tablet (0.4 mg total) under the tongue every 5 (five) minutes as needed for chest pain.  . potassium chloride SA (K-DUR,KLOR-CON) 20 MEQ tablet TAKE 1 TABLET BY MOUTH  DAILY  . Respiratory Therapy Supplies (FLUTTER) DEVI Use flutter device 3 times a day  . valsartan (DIOVAN) 160 MG tablet Take 1 tablet (160 mg total) by mouth daily.  . [DISCONTINUED] albuterol (PROVENTIL HFA;VENTOLIN HFA) 108 (90 Base) MCG/ACT inhaler Inhale 2 puffs into the lungs every 6 (six) hours as needed for wheezing or shortness of breath.  . [DISCONTINUED] amoxicillin-clavulanate (AUGMENTIN) 875-125 MG tablet Take 1 tablet by mouth 2 (two) times daily.  . [DISCONTINUED] budesonide-formoterol (SYMBICORT) 160-4.5 MCG/ACT inhaler Inhale 2 puffs into the lungs 2 (two) times daily.   No facility-administered encounter medications on file as of 10/29/2018.      Review of Systems  Review of Systems  Constitutional: Positive for fatigue. Negative for chills, fever and unexpected weight change.  HENT: Positive for congestion, postnasal drip, rhinorrhea, sinus pressure and sinus pain. Negative for ear pain.   Respiratory: Positive for cough (was yellow, now white ), shortness of breath and wheezing. Negative for  chest tightness.   Cardiovascular: Negative for chest pain and palpitations.  Gastrointestinal: Negative for diarrhea, nausea and vomiting.  Musculoskeletal: Negative for arthralgias.  Skin: Negative for color change.  Allergic/Immunologic: Positive for environmental allergies (Fall and winter, seasonal changes tend to be flares). Negative for food allergies.  Neurological: Positive for headaches. Negative for dizziness and light-headedness.  Psychiatric/Behavioral: Negative for dysphoric mood. The patient is not nervous/anxious.   All other systems reviewed and are negative.     Physical Exam  BP (!) 144/80 (BP Location: Left Wrist, Cuff Size: Normal)   Pulse 81   Temp 98.3 F (36.8 C) (Oral)   Ht 5\' 4"  (1.626 m)   Wt 207 lb 9.6 oz (94.2 kg)   SpO2 95%   BMI 35.63 kg/m   Wt Readings from Last 5 Encounters:  10/29/18 207 lb 9.6 oz (94.2 kg)  10/06/18 208 lb 9.6 oz (94.6 kg)  08/03/18 202 lb (91.6 kg)  07/22/18 206 lb 3.2 oz (93.5 kg)  07/15/18 204 lb 6.4 oz (92.7 kg)    Physical Exam  Constitutional: She is oriented to person, place, and time and well-developed, well-nourished, and in no distress. No distress.  HENT:  Head: Normocephalic and atraumatic.  Right Ear: Hearing, tympanic membrane, external ear and ear canal normal.  Left Ear: Hearing, tympanic membrane, external ear and ear canal normal.  Nose: Rhinorrhea present. Right sinus exhibits maxillary sinus tenderness and frontal sinus tenderness. Left sinus exhibits maxillary sinus tenderness and frontal sinus tenderness.  Mouth/Throat: Uvula is midline and oropharynx is clear and moist. No oropharyngeal exudate.  + Mallampati 1, postnasal drip  Eyes: Pupils are equal, round, and reactive to light.  Neck: Normal range of motion. Neck supple.  Cardiovascular: Normal rate, regular rhythm and normal heart sounds.  Pulmonary/Chest: Effort normal and breath sounds normal. No accessory muscle usage. No respiratory  distress. She has no decreased breath sounds. She has no wheezes. She has no rhonchi.  Musculoskeletal: Normal range of motion.        General: Edema (2+ lower extremity edema, patient wearing compression stockings) present.  Lymphadenopathy:    She has no cervical adenopathy.  Neurological: She is alert and oriented to person, place, and  time. Gait normal.  Skin: Skin is warm and dry. She is not diaphoretic. No erythema.  Psychiatric: Mood, memory, affect and judgment normal.  Nursing note and vitals reviewed.     Lab Results:  CBC    Component Value Date/Time   WBC 15.5 (H) 08/03/2018 1545   RBC 4.54 08/03/2018 1545   HGB 14.3 08/03/2018 1545   HCT 42.4 08/03/2018 1545   PLT 274.0 08/03/2018 1545   MCV 93.3 08/03/2018 1545   MCH 31.6 09/15/2016 1601   MCHC 33.6 08/03/2018 1545   RDW 13.7 08/03/2018 1545   LYMPHSABS 1.4 08/03/2018 1545   MONOABS 1.4 (H) 08/03/2018 1545   EOSABS 0.1 08/03/2018 1545   BASOSABS 0.1 08/03/2018 1545    BMET    Component Value Date/Time   NA 136 08/03/2018 1545   NA 138 11/03/2017 1046   K 4.3 08/03/2018 1545   CL 96 08/03/2018 1545   CO2 32 08/03/2018 1545   GLUCOSE 114 (H) 08/03/2018 1545   BUN 43 (H) 08/03/2018 1545   BUN 20 11/03/2017 1046   CREATININE 1.56 (H) 08/03/2018 1545   CALCIUM 9.4 08/03/2018 1545   GFRNONAA 45 (L) 11/03/2017 1046   GFRAA 52 (L) 11/03/2017 1046    BNP    Component Value Date/Time   BNP 82.0 07/01/2015 1653    ProBNP    Component Value Date/Time   PROBNP 49.0 07/22/2018 1555      Assessment & Plan:   85 year old female patient completing acute visit with our office today.  Unfortunately patient has not been adherent to outlined plan of care from ENT.  We will have patient resume nasal saline rinses, Flonase, will also have him add a daily antihistamine.  Patient to schedule follow-up with ENT as instructed.  For management of obstructive sleep apnea will refer patient for oral appliance from Dr.  Toy Cookey.  For shortness of breath we will do baseline lab work: CBC, BMP, BNP.  I believe we can hold off on a chest x-ray as she recently had on 10/07/2018 symptoms have not changed since then.  The chest x-ray was clear.  Offered patient to reschedule pulmonary function test sooner from 12/09/2018.  Patient declined she would like to keep this scheduled appointment.  Patient have scheduled follow-up with Dr. Elsworth Soho on 12/09/2018.  Sinusitis, acute maxillary Assessment: Stable chronic sinusitis Patient recently completed 14 days of Augmentin Patient has seen ENT on 10/13/2018  Plan: Resume ENTs instructions: Flonase, nasal saline rinses twice daily, completed antibiotics, following up with ENT in 4 weeks We will hold off on additional antibiotics and prednisone at this time as patient is afebrile as well as previous prednisone antibiotics did not show much improvement to symptoms Start daily antihistamine  OSA (obstructive sleep apnea) Assessment: Patient has been off CPAP therapy for last 6 months Patient reports she cannot tolerate CPAP mask, nasal dryness, CPAP leaks Patient refuses to restart CPAP therapy Patient with morning headaches, persistent fatigue, worsened over the last 6 months, increased daytime somnolence Mallampati 1  Plan: Patient with increased daytime somnolence and fatigue since stopping CPAP therapy, patient is willing to try oral appliance for treatment of obstructive sleep apnea as patient has failed CPAP therapy Encouraged weight loss and increased activity  Referral to Dr. Toy Cookey to receive an oral appliance for management of obstructive sleep apnea  We will refer you to Dr. Augustina Mood for your oral appliance  [address] [place], [place] [address] Phone: [phone] Website: [email or web address]  You will need to have six-month follow-up with Dr. Toy Cookey or your dentist to ensure your proper dentition and no issues with oral appliance.   We  will also need to have you follow-up in 6 months with our office with a home sleep study to ensure that your sleep apnea is being treated adequately.   Keep follow-up with Dr. Elsworth Soho in February/2020 with pulmonary function test before    Asthma Assessment: Lungs clear to auscultation Obese female Rhinorrhea  Plan: Walk in office today to check oxygenation >>>stable on room air   Please start taking a daily antihistamine:  >>>choose one of: zyrtec, claritin, allegra, or xyzal  >>>these are over the counter medications  >>>can choose generic option  >>>take daily  >>>this medication helps with allergies, post nasal drip, and cough   Continue Symbicort 160 >>> 2 puffs in the morning right when you wake up, rinse out your mouth after use, 12 hours later 2 puffs, rinse after use >>> Take this daily, no matter what >>> This is not a rescue inhaler    Allergic rhinitis Assessment: Rhinorrhea Seasonal aspect to breathing changes, patient reports increased sinusitis as well as trouble breathing in the fall/winter No profound response to prednisone per patient  Plan: Start daily antihistamine Flonase daily Nasal saline rinses at least twice a day Avoid known triggers   DYSPNEA Walk in office today to check oxygenation >>>stable on room air   Labwork today   Keep follow-up with Dr. Elsworth Soho in February/2020 with pulmonary function test before       Lauraine Rinne, NP 10/29/2018   This appointment was 45 min long with over 50% of the time in direct face-to-face patient care, assessment, plan of care, and follow-up.

## 2018-10-29 ENCOUNTER — Encounter: Payer: Self-pay | Admitting: Family Medicine

## 2018-10-29 ENCOUNTER — Ambulatory Visit: Payer: Medicare Other | Admitting: Pulmonary Disease

## 2018-10-29 ENCOUNTER — Ambulatory Visit (INDEPENDENT_AMBULATORY_CARE_PROVIDER_SITE_OTHER): Payer: Medicare Other | Admitting: Family Medicine

## 2018-10-29 ENCOUNTER — Encounter: Payer: Self-pay | Admitting: Pulmonary Disease

## 2018-10-29 VITALS — BP 128/70 | HR 76 | Resp 16 | Ht 60.75 in | Wt 206.6 lb

## 2018-10-29 VITALS — BP 144/80 | HR 81 | Temp 98.3°F | Ht 64.0 in | Wt 207.6 lb

## 2018-10-29 DIAGNOSIS — I2510 Atherosclerotic heart disease of native coronary artery without angina pectoris: Secondary | ICD-10-CM

## 2018-10-29 DIAGNOSIS — J4531 Mild persistent asthma with (acute) exacerbation: Secondary | ICD-10-CM

## 2018-10-29 DIAGNOSIS — J309 Allergic rhinitis, unspecified: Secondary | ICD-10-CM

## 2018-10-29 DIAGNOSIS — J4530 Mild persistent asthma, uncomplicated: Secondary | ICD-10-CM

## 2018-10-29 DIAGNOSIS — G4733 Obstructive sleep apnea (adult) (pediatric): Secondary | ICD-10-CM | POA: Diagnosis not present

## 2018-10-29 DIAGNOSIS — I10 Essential (primary) hypertension: Secondary | ICD-10-CM | POA: Diagnosis not present

## 2018-10-29 DIAGNOSIS — N189 Chronic kidney disease, unspecified: Secondary | ICD-10-CM

## 2018-10-29 DIAGNOSIS — J0101 Acute recurrent maxillary sinusitis: Secondary | ICD-10-CM | POA: Diagnosis not present

## 2018-10-29 DIAGNOSIS — Z7689 Persons encountering health services in other specified circumstances: Secondary | ICD-10-CM

## 2018-10-29 DIAGNOSIS — R0602 Shortness of breath: Secondary | ICD-10-CM

## 2018-10-29 LAB — BASIC METABOLIC PANEL
BUN: 18 mg/dL (ref 6–23)
CO2: 27 mEq/L (ref 19–32)
Calcium: 9.7 mg/dL (ref 8.4–10.5)
Chloride: 101 mEq/L (ref 96–112)
Creatinine, Ser: 1.34 mg/dL — ABNORMAL HIGH (ref 0.40–1.20)
GFR: 39.97 mL/min — ABNORMAL LOW (ref >60.00)
Glucose, Bld: 104 mg/dL — ABNORMAL HIGH (ref 70–99)
Potassium: 4.5 mEq/L (ref 3.5–5.1)
Sodium: 138 mEq/L (ref 135–145)

## 2018-10-29 LAB — CBC
HCT: 36.8 % (ref 36.0–46.0)
Hemoglobin: 12.5 g/dL (ref 12.0–15.0)
MCHC: 33.9 g/dL (ref 30.0–36.0)
MCV: 96.1 fl (ref 78.0–100.0)
Platelets: 234 10*3/uL (ref 150.0–400.0)
RBC: 3.83 Mil/uL — ABNORMAL LOW (ref 3.87–5.11)
RDW: 14.7 % (ref 11.5–15.5)
WBC: 8.5 10*3/uL (ref 4.0–10.5)

## 2018-10-29 LAB — BRAIN NATRIURETIC PEPTIDE: Pro B Natriuretic peptide (BNP): 58 pg/mL (ref 0.0–100.0)

## 2018-10-29 NOTE — Assessment & Plan Note (Signed)
Assessment: Lungs clear to auscultation Obese female Rhinorrhea  Plan: Walk in office today to check oxygenation >>>stable on room air   Please start taking a daily antihistamine:  >>>choose one of: zyrtec, claritin, allegra, or xyzal  >>>these are over the counter medications  >>>can choose generic option  >>>take daily  >>>this medication helps with allergies, post nasal drip, and cough   Continue Symbicort 160 >>> 2 puffs in the morning right when you wake up, rinse out your mouth after use, 12 hours later 2 puffs, rinse after use >>> Take this daily, no matter what >>> This is not a rescue inhaler

## 2018-10-29 NOTE — Assessment & Plan Note (Signed)
Assessment: Stable chronic sinusitis Patient recently completed 14 days of Augmentin Patient has seen ENT on 10/13/2018  Plan: Resume ENTs instructions: Flonase, nasal saline rinses twice daily, completed antibiotics, following up with ENT in 4 weeks We will hold off on additional antibiotics and prednisone at this time as patient is afebrile as well as previous prednisone antibiotics did not show much improvement to symptoms Start daily antihistamine

## 2018-10-29 NOTE — Assessment & Plan Note (Signed)
Assessment: Patient has been off CPAP therapy for last 6 months Patient reports she cannot tolerate CPAP mask, nasal dryness, CPAP leaks Patient refuses to restart CPAP therapy Patient with morning headaches, persistent fatigue, worsened over the last 6 months, increased daytime somnolence Mallampati 1  Plan: Patient with increased daytime somnolence and fatigue since stopping CPAP therapy, patient is willing to try oral appliance for treatment of obstructive sleep apnea as patient has failed CPAP therapy Encouraged weight loss and increased activity  Referral to Dr. Toy Cookey to receive an oral appliance for management of obstructive sleep apnea  We will refer you to Dr. Augustina Mood for your oral appliance  [address] [place]. [place], [place] [address] Phone: [phone] Website: [email or web address]  You will need to have six-month follow-up with Dr. Toy Cookey or your dentist to ensure your proper dentition and no issues with oral appliance.   We will also need to have you follow-up in 6 months with our office with a home sleep study to ensure that your sleep apnea is being treated adequately.   Keep follow-up with Dr. Elsworth Soho in February/2020 with pulmonary function test before

## 2018-10-29 NOTE — Assessment & Plan Note (Signed)
Walk in office today to check oxygenation >>>stable on room air   Labwork today   Keep follow-up with Dr. Elsworth Soho in February/2020 with pulmonary function test before

## 2018-10-29 NOTE — Assessment & Plan Note (Signed)
Assessment: Rhinorrhea Seasonal aspect to breathing changes, patient reports increased sinusitis as well as trouble breathing in the fall/winter No profound response to prednisone per patient  Plan: Start daily antihistamine Flonase daily Nasal saline rinses at least twice a day Avoid known triggers

## 2018-10-29 NOTE — Progress Notes (Signed)
Still showing chronic kidney disease.  Kidney functioning is slightly improved since last blood work.  Blood work does not show that you are holding onto fluid.   No changes in plan of care at this time.  Keep follow-up as planned.  Wyn Quaker, FNP

## 2018-10-29 NOTE — Patient Instructions (Addendum)
Walk in office today to check oxygenation >>>stable on room air   Labwork today   Please start taking a daily antihistamine:  >>>choose one of: zyrtec, claritin, allegra, or xyzal  >>>these are over the counter medications  >>>can choose generic option  >>>take daily  >>>this medication helps with allergies, post nasal drip, and cough   Continue Symbicort 160 >>> 2 puffs in the morning right when you wake up, rinse out your mouth after use, 12 hours later 2 puffs, rinse after use >>> Take this daily, no matter what >>> This is not a rescue inhaler   Referral to Dr. Toy Cookey to receive an oral appliance for management of obstructive sleep apnea  We will refer you to Dr. Augustina Mood for your oral appliance  [address] [place]. [place],  [address] Phone: [phone] Website: [email or web address]  You will need to have six-month follow-up with Dr. Toy Cookey or your dentist to ensure your proper dentition and no issues with oral appliance.   We will also need to have you follow-up in 6 months with our office with a home sleep study to ensure that your sleep apnea is being treated adequately.   Keep follow-up with Dr. Elsworth Soho in February/2020 with pulmonary function test before  Follow ear nose and throat doctors instructions below:  Will extend current course of Augmentin to be a true 14-day course 2. Continue Flonase - 1 spray each nostril daily  3. Nasal irrigations twice daily 4. Nasal humidification regimen 5. RTC 4 weeks  Contact ear nose and throat to get scheduled for a follow-up appointment  It is flu season:   >>>Remember to be washing your hands regularly, using hand sanitizer, be careful to use around herself with has contact with people who are sick will increase her chances of getting sick yourself. >>> Best ways to protect herself from the flu: Receive the yearly flu vaccine, practice good hand hygiene washing with soap and also using hand sanitizer when  available, eat a nutritious meals, get adequate rest, hydrate appropriately   Please contact the office if your symptoms worsen or you have concerns that you are not improving.   Thank you for choosing  [facility] for your healthcare, and for allowing [place] to partner with you on your healthcare journey. I am thankful to be able to provide care to you today.   Wyn Quaker FNP-C

## 2018-10-29 NOTE — Patient Instructions (Signed)
It was a pleasure meeting you today.  I recommend that you start back on the regimen recommended for sinus issues.  Return at your convenience for a fasting Medicare wellness visit.

## 2018-10-29 NOTE — Progress Notes (Signed)
Subjective:    Patient ID: Sheryl Curtis, female    DOB: 11/13/1931, 86 y.o.   MRN: [id number]  HPI Chief Complaint  Patient presents with  . new pt    new pt get establish. needs a letter for insurance to pay for a motorized wheel chair,  no medicine   She is new to the practice and here to establish care.  Previous medical care: Dr. Mitchell, PCP  She has multiple specialists for chronic health conditions.   States she has an electric wheelchair that needs repairs. She has had this over a year. States she needs the chair because of her shortness of breath and chronic back pain.  Apparently her previous PCP signed off on her getting this. She cannot recall where she go it however. States she does not use it often.  History of A-fib, CAD, COPD, asthma. All managed by her pulmonologist.   HTN- managed by cardiologist.   History of colon cancer. Has a letter from [facility] stating that she only needs annual stool screening testing.   Reports having chronic sinusitis.   Other providers: Pulmonologist- Dr. Alva  Cardiologist- Dr. Varanasi  Orthopedist- Dr. Newton  [facility]  Dr. Newman- general surgeon.  Dr. Hecker- ophthalmology   OSA untreated. Seeing pulmonologist for this. States she cannot tolerate CPAP.  States she plans to try a dental appliance.   Her daughter is with her today. States she is a widow.   Denies fever, chills, dizziness, chest pain, palpitations, shortness of breath, abdominal pain, N/V/D.   Reviewed allergies, medications, past medical, surgical, and social history.    Review of Systems Pertinent positives and negatives in the history of present illness.     Objective:   Physical Exam BP 128/70   Pulse 76   Resp 16   Ht 5' 0.75" (1.543 m)   Wt 206 lb 9.6 oz (93.7 kg)   SpO2 96%   BMI 39.36 kg/m   Alert and oriented and in no distress.  Pharyngeal area is normal. Neck is supple without adenopathy or thyromegaly. Cardiac exam shows a RRR.  Lungs are clear to auscultation. Skin is warm and dry.       Assessment & Plan:  Essential hypertension  OSA (obstructive sleep apnea)  Atherosclerosis of native coronary artery of native heart without angina pectoris  Mild persistent asthma with (acute) exacerbation  Encounter to establish care  86 year old female who is here today to establish care. She will continue seeing specialists for complex health conditions.  HTN- appears to be in goal range today. Managed by cardiologist.  Untreated sleep apnea. Plans to try dental appliance next.  Asthma and COPD manageable. Sees pulmonologist.  CKD stage 3 Hx of chronic pain.  Requests letter to get electric wheelchair repaired. She is not sure where she got this chair. It is unclear whether she actually needs this. Discussed need to determine functionality and that I cannot sign off on this having just met her today. She may consult with her pulmonologist or orthopedist about this.  She will follow up when due for Medicare Wellness.   Reviewed notes from Erin Gray at [facility] from 02/2016. Documented hx of RA that is seronegative and hand US confirmed. She took MTX and this was switched to leflunomide. Patient stopped this due to itching per record. Also documented hx of fibromyalgia, osteoarthritis and DDD. She declines PT due to cost and requested a scooter at that time.   

## 2018-10-30 ENCOUNTER — Telehealth: Payer: Self-pay | Admitting: Family Medicine

## 2018-10-30 NOTE — Telephone Encounter (Signed)
I do not recommend Benadryl at night due to it being sedating. She may try a non sedating one such as Claritin, Allegra, Zyrtec or Xyzal.  In regards to the alprazolam. I do not recommend this medication. It is not recommended for her age group. It puts her at an increased risk of sedation and falls.

## 2018-10-30 NOTE — Telephone Encounter (Signed)
I am not prescribing her alprazolam due to the potential side effects. She did not bring  this up yesterday or I would have told her this at her visit. I recommend counseling to help with anxiety. Does she have a counselor or would she like for [place] to send her a list of counselors she can contact?

## 2018-10-30 NOTE — Telephone Encounter (Signed)
   Please call pt  She has a couple questions   1    She states she talked to you about her head itching and she       Wants to know if she can take benadryl at night because it helps        Her not to keep waking up at night itching and how often can she              Take  2    She has a rx for alprazolam 0.25 that she had previously that she            Takes when she gets nervous.  rx was given to her 10/2017 and she        has a few left but likes to be able to have them for when she gets        feeling nervous

## 2018-10-30 NOTE — Telephone Encounter (Signed)
Patient stated she taking Claratin. She stated the Alprazolam helps calm her down and she still wants it.

## 2018-11-02 ENCOUNTER — Encounter: Payer: Self-pay | Admitting: Internal Medicine

## 2018-11-02 ENCOUNTER — Encounter: Payer: Self-pay | Admitting: Family Medicine

## 2018-11-02 ENCOUNTER — Telehealth: Payer: Self-pay | Admitting: Pulmonary Disease

## 2018-11-02 NOTE — Telephone Encounter (Signed)
lmtcb for pt's daughter.  Referral to Dr. Ron Parker for oral appliance was placed on 1/2 and confirmation received.   Dr. Ron Parker' office number: [phone].  Pt needs to call this number to follow up on referral at this point in process.

## 2018-11-03 ENCOUNTER — Telehealth: Payer: Self-pay | Admitting: Pulmonary Disease

## 2018-11-03 NOTE — Telephone Encounter (Signed)
Patient has been informed of providers message. She stated she cannot afford a counselor right now.

## 2018-11-03 NOTE — Telephone Encounter (Signed)
Received message from [facility], Dr. Ron Parker office, requesting Patient's last sleep study. Sleep study faxed to [phone], attn Kasie.  Confirmation received.  Nothing further at this time.

## 2018-11-03 NOTE — Telephone Encounter (Signed)
Called and spoke with patients daughter, she is aware and verbalized understanding. Nothing further needed. 

## 2018-11-11 ENCOUNTER — Telehealth: Payer: Self-pay | Admitting: Interventional Cardiology

## 2018-11-11 NOTE — Telephone Encounter (Signed)
° ° °  Pt c/o medication issue:  1. Name of Medication: clopidogrel (PLAVIX) 75 MG tablet  2. How are you currently taking this medication (dosage and times per day)? As written  3. Are you having a reaction (difficulty breathing--STAT)? n/a  4. What is your medication issue? Patient calling states medication is causing itching in scalp

## 2018-11-11 NOTE — Telephone Encounter (Signed)
Returned call to patient. She states that she believes that her plavix is causing her scalp to itch. Made patient aware that she has been on plavix for a long time and that it is highly unlikely that her plavix is causing her scalp to itch. Instructed the patient to try using shampoo for dry scapl and reach out to PCP. Patient verbalized understanding and thanked me for the call.

## 2018-12-08 ENCOUNTER — Other Ambulatory Visit: Payer: Self-pay | Admitting: Pulmonary Disease

## 2018-12-08 DIAGNOSIS — J4530 Mild persistent asthma, uncomplicated: Secondary | ICD-10-CM

## 2018-12-09 ENCOUNTER — Ambulatory Visit (INDEPENDENT_AMBULATORY_CARE_PROVIDER_SITE_OTHER): Payer: Medicare Other | Admitting: Pulmonary Disease

## 2018-12-09 ENCOUNTER — Encounter: Payer: Self-pay | Admitting: Pulmonary Disease

## 2018-12-09 ENCOUNTER — Ambulatory Visit: Payer: Medicare Other | Admitting: Pulmonary Disease

## 2018-12-09 DIAGNOSIS — J4530 Mild persistent asthma, uncomplicated: Secondary | ICD-10-CM

## 2018-12-09 DIAGNOSIS — G4733 Obstructive sleep apnea (adult) (pediatric): Secondary | ICD-10-CM

## 2018-12-09 DIAGNOSIS — R5381 Other malaise: Secondary | ICD-10-CM | POA: Insufficient documentation

## 2018-12-09 DIAGNOSIS — J4531 Mild persistent asthma with (acute) exacerbation: Secondary | ICD-10-CM

## 2018-12-09 LAB — PULMONARY FUNCTION TEST
DL/VA % pred: 108 %
DL/VA: 4.46 ml/min/mmHg/L
DLCO cor % pred: 103 %
DLCO cor: 17.57 ml/min/mmHg
DLCO unc % pred: 100 %
DLCO unc: 17.07 ml/min/mmHg
FEF 25-75 Post: 1.12 L/sec
FEF 25-75 Pre: 0.72 L/sec
FEF2575-%Change-Post: 54 %
FEF2575-%Pred-Post: 105 %
FEF2575-%Pred-Pre: 68 %
FEV1-%Change-Post: 9 %
FEV1-%Pred-Post: 86 %
FEV1-%Pred-Pre: 79 %
FEV1-Post: 1.36 L
FEV1-Pre: 1.24 L
FEV1FVC-%Change-Post: 3 %
FEV1FVC-%Pred-Pre: 96 %
FEV6-%Change-Post: 8 %
FEV6-%Pred-Post: 93 %
FEV6-%Pred-Pre: 85 %
FEV6-Post: 1.86 L
FEV6-Pre: 1.71 L
FEV6FVC-%Change-Post: 2 %
FEV6FVC-%Pred-Post: 106 %
FEV6FVC-%Pred-Pre: 103 %
FVC-%Change-Post: 6 %
FVC-%Pred-Post: 87 %
FVC-%Pred-Pre: 82 %
FVC-Post: 1.87 L
FVC-Pre: 1.76 L
Post FEV1/FVC ratio: 72 %
Post FEV6/FVC ratio: 99 %
Pre FEV1/FVC ratio: 70 %
Pre FEV6/FVC Ratio: 97 %
RV % pred: 122 %
RV: 2.86 L
TLC % pred: 101 %
TLC: 4.75 L

## 2018-12-09 MED ORDER — ALBUTEROL SULFATE 108 (90 BASE) MCG/ACT IN AEPB: 1.0000 | INHALATION_SPRAY | Freq: Four times a day (QID) | RESPIRATORY_TRACT | 6 refills | Status: DC | PRN

## 2018-12-09 NOTE — Assessment & Plan Note (Signed)
Refill on albuterol MDI.  Okay to take nebulizer 2-3 times daily as needed Take Symbicort twice daily.

## 2018-12-09 NOTE — Patient Instructions (Signed)
Refill on albuterol MDI.  Okay to take nebulizer 2-3 times daily as needed Take Symbicort twice daily.  We will ask your PCP to get you started on home physical therapy.  Okay to discontinue CPAP

## 2018-12-09 NOTE — Progress Notes (Signed)
   Subjective:    Patient ID: Sheryl Curtis, female    DOB: 09-Oct-1932, 85 y.o.   MRN: [id number]  HPI  85 yo remote smoker with severe RA,for FU of airway Obstruction & OSA She has severe rheumatoid arthritis with previous MTX use and fibromyalgia CT chest has shown multiple pulmonary nodules that have been stable, last 2018  PMH - rectal cancer status post resection in 2007, stent to LAD in 04/2013. she takes Lasix daily for bipedal edema.  Chief Complaint  Patient presents with  . Follow-up    1 month f/u. Increased fatigue. Not able to sleep well at night. Not able to tolerate CPAP machine.     She remains dyspneic and limited mobility, ambulates with a cane and short of breath on minimal activity  We reviewed PFTs today which appears surprisingly okay Echo from 06/2018 was reviewed which also appears normal. For the past year she has not needed any primary medication for rheumatoid arthritis, uses CBD oil with good results  She is just not able to use her CPAP machine, has tried various mask interfaces including nasal pillows  Significant tests/ events reviewed   CTchest 03/2017 -stable multiple pulmonary nodules since 2015  Spirometry in 05/2010 showed FEV1 of 76% improvement postbronchodilator to 80%-1.60 with a ratio of 75 suggesting mild restriction. Spirometry 03/2014 FEV1 of 1.25-64%, with ratio 65 and FVC of 1.91-72%.  PFTs - 12/2014 - no airway obstruction, ratio 71, pos BD response PFTs 11/2018, and mild airway obstruction with FEV1 79%, TLC 101%, DLCO normal, no bronchodilator response  06/2015 Admit for UTI and Sepsis   NPSG >> AHI 10/h corrected by CPAP 10 cm  Echo 06/2018>normal LV function without any evidence of diastolic dysfunction.   Review of Systems neg for any significant sore throat, dysphagia, itching, sneezing, nasal congestion or excess/ purulent secretions, fever, chills, sweats, unintended wt loss, pleuritic or exertional cp, hempoptysis,  orthopnea pnd or change in chronic leg swelling. Also denies presyncope, palpitations, heartburn, abdominal pain, nausea, vomiting, diarrhea or change in bowel or urinary habits, dysuria,hematuria, rash, arthralgias, visual complaints, headache, numbness weakness or ataxia.     Objective:   Physical Exam  Gen. Pleasant, obese, in no distress ENT - no lesions, no post nasal drip Neck: No JVD, no thyromegaly, no carotid bruits Lungs: no use of accessory muscles, no dullness to percussion, decreased without rales or rhonchi  Cardiovascular: Rhythm regular, heart sounds  normal, no murmurs or gallops, no peripheral edema Musculoskeletal: No deformities, no cyanosis or clubbing , no tremors       Assessment & Plan:

## 2018-12-09 NOTE — Assessment & Plan Note (Signed)
Okay to discontinue CPAP

## 2018-12-09 NOTE — Progress Notes (Signed)
PFT done today. 

## 2018-12-09 NOTE — Assessment & Plan Note (Addendum)
We will ask your PCP to get you started on physical therapy. This may be the main cause of her dyspnea -no cardiopulmonary cause identified

## 2018-12-21 ENCOUNTER — Telehealth: Payer: Self-pay | Admitting: Pulmonary Disease

## 2018-12-21 DIAGNOSIS — J4531 Mild persistent asthma with (acute) exacerbation: Secondary | ICD-10-CM

## 2018-12-21 NOTE — Telephone Encounter (Signed)
Asks that we call patient directly at [phone].

## 2018-12-21 NOTE — Telephone Encounter (Signed)
lmom 

## 2018-12-22 NOTE — Telephone Encounter (Signed)
Spoke with pt. She is needing nebulizer tubing kits. Order has been placed for this. Nothing further was needed.

## 2018-12-23 ENCOUNTER — Telehealth: Payer: Self-pay | Admitting: Family Medicine

## 2018-12-23 NOTE — Telephone Encounter (Signed)
Pt gets dizzy at night. Pt has been on meclizine 25mg  before. Pt was advised that she would need to be seen for rx. She could go to pharmacy and ask for OTC dizziness med. Pt has an appt next week and didn't want to come in sooner

## 2018-12-23 NOTE — Telephone Encounter (Signed)
Pt called and stated that she got up last night and got dizzy and had a fall. I asked pt did she wanna schedule a visit but she said no she only hurt her elbow but she wanted to know if she can get some Meclizine for dizziness

## 2018-12-23 NOTE — Telephone Encounter (Signed)
It would be important to know the reason she was dizzy.  This could be from position changes or vertigo if she has a history of this but I would not feel comfortable prescribing a medication without knowing the reason for her dizziness which led to her fall.  I do recommend that she be seen

## 2018-12-30 ENCOUNTER — Ambulatory Visit: Payer: Medicare Other | Admitting: Family Medicine

## 2019-01-01 ENCOUNTER — Ambulatory Visit (INDEPENDENT_AMBULATORY_CARE_PROVIDER_SITE_OTHER): Payer: Medicare Other | Admitting: Family Medicine

## 2019-01-01 ENCOUNTER — Encounter: Payer: Self-pay | Admitting: Family Medicine

## 2019-01-01 VITALS — BP 124/84 | HR 67 | Temp 98.0°F | Resp 16 | Wt 203.8 lb

## 2019-01-01 DIAGNOSIS — L299 Pruritus, unspecified: Secondary | ICD-10-CM

## 2019-01-01 DIAGNOSIS — M797 Fibromyalgia: Secondary | ICD-10-CM

## 2019-01-01 DIAGNOSIS — J4530 Mild persistent asthma, uncomplicated: Secondary | ICD-10-CM | POA: Diagnosis not present

## 2019-01-01 DIAGNOSIS — G4733 Obstructive sleep apnea (adult) (pediatric): Secondary | ICD-10-CM | POA: Diagnosis not present

## 2019-01-01 DIAGNOSIS — R531 Weakness: Secondary | ICD-10-CM | POA: Diagnosis not present

## 2019-01-01 DIAGNOSIS — R5381 Other malaise: Secondary | ICD-10-CM

## 2019-01-01 DIAGNOSIS — W19XXXA Unspecified fall, initial encounter: Secondary | ICD-10-CM

## 2019-01-01 DIAGNOSIS — N189 Chronic kidney disease, unspecified: Secondary | ICD-10-CM

## 2019-01-01 DIAGNOSIS — I700 Atherosclerosis of aorta: Secondary | ICD-10-CM

## 2019-01-01 NOTE — Progress Notes (Signed)
Subjective:    Patient ID: Sheryl Curtis, female    DOB: July 14, 1932, 85 y.o.   MRN: [id number]  HPI Chief Complaint  Patient presents with  . SOB    sob with movement or laying down. not feeling well.    She is here with multiple complaints including generalized weakness, fatigue, shortness of breath and chronic pain.  She also complains of an itchy scalp. History of severe RA and fibromyalgia. History of OSA and is unable to use her CPAP.  States her pulmonologist is aware of this. States she does not have any worsening shortness of breath.  No chest pain, palpitations, lower extremity edema. Denies fever, chills, cough.  She has multiple specialists caring for her.  States she is aware that she has physical deconditioning and was referred to physical therapy by her pulmonologist.  She refused to go to the appointment.  She would like to have home physical therapy if possible.  She lives alone but her daughter is close by and she often has family checking on her daily.  States she fell recently injuring her right arm.  She has a walker at home but often does not use it.  She also has a history of chronic kidney disease has been monitored.  She is requesting Ambien for sleep or alprazolam for anxiety.  States she has taken these medications in the past and does not abuse them.  She uses pill pack for her medications.   Review of Systems Pertinent positives and negatives in the history of present illness.     Objective:   Physical Exam Constitutional:      General: She is not in acute distress. HENT:     Mouth/Throat:     Lips: Pink.     Mouth: Mucous membranes are moist.     Pharynx: Oropharynx is clear. Uvula midline.  Eyes:     General: Lids are normal.     Extraocular Movements: Extraocular movements intact.     Conjunctiva/sclera: Conjunctivae normal.  Neck:     Musculoskeletal: Neck supple.  Cardiovascular:     Rate and Rhythm: Normal rate. Rhythm irregular.    Pulmonary:     Effort: Pulmonary effort is normal.     Breath sounds: Normal breath sounds.  Musculoskeletal:     Right lower leg: No edema.     Left lower leg: No edema.  Lymphadenopathy:     Cervical: No cervical adenopathy.  Skin:    General: Skin is warm and dry.     Capillary Refill: Capillary refill takes less than 2 seconds.     Coloration: Skin is not pale.     Findings: No rash.  Neurological:     Mental Status: She is alert and oriented to person, place, and time.     Cranial Nerves: Cranial nerves are intact.  Psychiatric:        Attention and Perception: Attention normal.        Mood and Affect: Mood normal.        Speech: Speech normal.        Cognition and Memory: Cognition and memory normal.    BP 124/84   Pulse 67   Temp 98 F (36.7 C)   Resp 16   Wt 203 lb 12.8 oz (92.4 kg)   SpO2 96%   BMI 37.88 kg/m       Assessment & Plan:  Generalized weakness - Plan: AMB Referral to [facility]  OSA (obstructive sleep  apnea)  Mild persistent asthma without complication - Plan: AMB Referral to [facility]  Fibromyalgia  Physical deconditioning - Plan: AMB Referral to [facility]  Itchy scalp  Aortic atherosclerosis ([place]) - Plan: Lipid panel  Fall, initial encounter - Plan: AMB Referral to [facility]  Chronic kidney disease, unspecified CKD stage - Plan: Basic Metabolic Panel  She does not appear to be in any acute distress.  She is quite talkative without any signs of respiratory distress.  No sign of infection. I am concerned about her home situation and that she does live alone and has had a recent fall.  She does not consistently use her walker.  She has chronic pain.  Requesting medications that are potentially sedating such as Ambien and alprazolam and I refused to prescribe these medications.  She has untreated OSA. Reports she cannot tolerate CPAP.  She verbalized understanding. She has multiple specialists  including pulmonology, cardiology, ENT. Discussed that I agree with her pulmonologist in regards to her physical deconditioning being her main issue currently. Plan to refer her to [facility] for a home evaluation and also will attempt to get home physical therapy for her to help with her conditioning. She agrees that if home physical therapy is not an option that she will go to the physical therapy appointments. Discussed trying a shampoo for dry scalp and if this is not helping then she may need to see her dermatologist. She is fasting today and requests to have lipids checked as well as BMP for renal function.

## 2019-01-02 LAB — LIPID PANEL
Chol/HDL Ratio: 4.5 ratio — ABNORMAL HIGH (ref 0.0–4.4)
Cholesterol, Total: 212 mg/dL — ABNORMAL HIGH (ref 100–199)
HDL: 47 mg/dL (ref >39)
LDL Calculated: 124 mg/dL — ABNORMAL HIGH (ref 0–99)
Triglycerides: 204 mg/dL — ABNORMAL HIGH (ref 0–149)
VLDL Cholesterol Cal: 41 mg/dL — ABNORMAL HIGH (ref 5–40)

## 2019-01-02 LAB — BASIC METABOLIC PANEL
BUN/Creatinine Ratio: 19 (ref 12–28)
BUN: 29 mg/dL — ABNORMAL HIGH (ref 8–27)
CO2: 23 mmol/L (ref 20–29)
Calcium: 9.9 mg/dL (ref 8.7–10.3)
Chloride: 96 mmol/L (ref 96–106)
Creatinine, Ser: 1.52 mg/dL — ABNORMAL HIGH (ref 0.57–1.00)
GFR calc Af Amer: 36 mL/min/{1.73_m2} — ABNORMAL LOW (ref >59)
GFR calc non Af Amer: 31 mL/min/{1.73_m2} — ABNORMAL LOW (ref >59)
Glucose: 100 mg/dL — ABNORMAL HIGH (ref 65–99)
Potassium: 4.4 mmol/L (ref 3.5–5.2)
Sodium: 139 mmol/L (ref 134–144)

## 2019-01-04 ENCOUNTER — Other Ambulatory Visit: Payer: Self-pay

## 2019-01-04 NOTE — Patient Outreach (Signed)
[facility]) Care Management  01/04/2019  Sheryl Curtis Apr 20, 1932 [id number]   Referral Date: 01/04/2019 Referral Source: MD referral Referral Reason: Fall/deconditioning and home evaluation  Outreach Attempt: spoke with patient. She is able to verify HIPAA.  Discussed reason for referral.  Patient states she saw the physician on Friday and recently had a fall. Patient reports that she feels she has gone down some in the last few months.  Discussed [facility] services and support.  Patient agreeable for services and nurse to visit for home evaluation and safety.    Patient states she lives alone but daughter [name] comes by daily.  She states she is independent with care and that she still drives but she states sometimes her daughter drives her.  Patient states she got dizzy about two weeks ago and fell and injured her elbow.  Per physician note possible order for physical therapy.    Per Patient and record patient has history of COPD/asthma, A. Fib, Falls, CKD, Fibromyalgia, and HTN.  Patient states she takes about 7 medications and has no problems affording medications.    Patient does not have an advanced directive but states she has the paperwork if she wants to fill out one.      Plan: RN CM will refer to community for home evaluation/safety.     Jone Baseman, RN, MSN [facility] Care Management Care Management Coordinator Direct Line [phone] Toll Free: [phone]  Fax: [phone]

## 2019-01-07 ENCOUNTER — Other Ambulatory Visit: Payer: Self-pay

## 2019-01-07 ENCOUNTER — Ambulatory Visit: Payer: Medicare Other

## 2019-01-07 ENCOUNTER — Ambulatory Visit: Payer: Medicare Other | Admitting: Family Medicine

## 2019-01-07 NOTE — Patient Outreach (Signed)
[facility] Care Management  01/07/2019  Sheryl Curtis 17-Sep-1932 [id number]  Order for home evaluation for falls. PCP will do own TOC and will make RN CM aware of additional assistance needed.   Called member at preferred number and HIPPA compliant voicemail message left. Called member's secondary number and HIPPA compliant voice mail message left.   Will call member in 4 business days if no return call from member.  [name], RN-BSN  [facility] Care Management  Community Care Management Coordinator  [phone] [email or web address]

## 2019-01-13 ENCOUNTER — Other Ambulatory Visit: Payer: Self-pay

## 2019-01-13 NOTE — Patient Outreach (Signed)
Sheryl Curtis  01/13/2019   Sheryl Curtis Mar 17, 1932 [id number]  Subjective: Denies pain. Denies mobile meals  Objective: 85 year old female. PMH: HTN; ASTHMA; OSA (UNABLE TOL. CPAP); CKD-3 (NO HD); GERD; INSOMNIA; STENT IN HEART; AFIB; ALLERGIC RHINITIS;   Current Medications:  Current Outpatient Medications  Medication Sig Dispense Refill  . albuterol (PROVENTIL) (2.5 MG/3ML) 0.083% nebulizer solution USE 1 VIAL VIA NEBULIZER EVERY 6 HOURS AND AS NEEDED 360 mL 0  . Albuterol Sulfate (PROAIR RESPICLICK) 782 (90 Base) MCG/ACT AEPB Inhale 1-2 puffs into the lungs every 6 (six) hours as needed (for shortness of breath and/or wheezing.). 1 each 6  . Ascorbic Acid (VITAMIN C) 1000 MG tablet Take 1,000 mg by mouth daily.    . budesonide-formoterol (SYMBICORT) 160-4.5 MCG/ACT inhaler Inhale 2 puffs into the lungs 2 (two) times daily. 1 Inhaler 2  . CALCIUM PO Take 1,200 mg by mouth daily.     . cetirizine (ZYRTEC) 10 MG tablet Take 10 mg by mouth daily.    . clopidogrel (PLAVIX) 75 MG tablet TAKE 1 TABLET(75 MG) BY MOUTH EVERY OTHER DAY 45 tablet 2  . DULoxetine (CYMBALTA) 60 MG capsule Take 60 mg by mouth daily.    . furosemide (LASIX) 40 MG tablet TAKE 1 TABLET BY MOUTH  DAILY 90 tablet 1  . ipratropium (ATROVENT) 0.03 % nasal spray Place 2 sprays into both nostrils every 12 (twelve) hours. 30 mL 1  . isosorbide mononitrate (IMDUR) 30 MG 24 hr tablet TAKE 1 TABLET(30 MG) BY MOUTH DAILY 30 tablet 8  . Nebulizers (COMPRESSOR NEBULIZER) MISC 1 Device by Does not apply route once. 1 each 0  . nitroGLYCERIN (NITROSTAT) 0.4 MG SL tablet Place 1 tablet (0.4 mg total) under the tongue every 5 (five) minutes as needed for chest pain. 90 tablet 3  . Omega-3 Fatty Acids (FISH OIL) 1000 MG CAPS Take by mouth.    . potassium chloride SA (K-DUR,KLOR-CON) 20 MEQ tablet TAKE 1 TABLET BY MOUTH  DAILY 90 tablet 3  . Respiratory Therapy Supplies (FLUTTER) DEVI Use flutter device  3 times a day 1 each 0  . valsartan-hydrochlorothiazide (DIOVAN-HCT) 160-12.5 MG tablet Take 1 tablet by mouth daily.     No current facility-administered medications for this visit.     Functional Status:  In your present state of health, do you have any difficulty performing the following activities: 01/13/2019  Hearing? Y  Comment bilateral hearing aids  Vision? Y  Difficulty concentrating or making decisions? N  Walking or climbing stairs? Y  Dressing or bathing? N  Doing errands, shopping? Y  Comment daughter pays bills for member  Preparing Food and eating ? N  Using the Toilet? N  In the past six months, have you accidently leaked urine? Y  Comment wears depends  Do you have problems with loss of bowel control? Y  Managing your Medications? N  Managing your Finances? Y  Housekeeping or managing your Housekeeping? Y  Some recent data might be hidden    Fall/Depression Screening: Fall Risk  01/13/2019 10/29/2018 08/28/2015  Falls in the past year? 1 1 Yes  Number falls in past yr: 0 0 1  Injury with Fall? 1 0 Yes  Comment member states at 80, she attempted to go to the bathroom, when she stood up she was dizzy and fell. States she hit her head, shoulder and elbow; however, did not seek medical attention until next MD office visit. Denies any current  concerns.  - -  Risk Factor Category  - - High Fall Risk  Risk for fall due to : History of fall(s);Impaired vision;Impaired balance/gait - History of fall(s)  Risk for fall due to: Comment Member states she has a night light and denies use of scattered rugs - -  Follow up Falls evaluation completed;Education provided;Falls prevention discussed - Falls prevention discussed   PHQ 2/9 Scores 01/13/2019 01/04/2019 10/29/2018 08/28/2015 06/21/2013  PHQ - 2 Score 2 0 0 0 1  PHQ- 9 Score 6 - - - -  Exception Documentation Patient refusal - - - -    Assessment: Received referral for [facility] RN CM to follow up due to recent fall. PCP will do  own TOC and will notify RN CM for any needs. Member requests assistance with ordering battery for w/c next week.  Member states Lives alone. Own ADLs; Prepares own meals; has cane and walker but doesn't use daily. Has 3 daughters [name], Ike Bene) and Betty,daughter comes daily to assist with IADLs (paying bills and cleaning home). Denies more falls since last fall and states 1 fall within last year due to dizziness. Member states she drives her own vehicle.  Member states she takes her BP at home at times and writes it down and states BP monitored via MD. Last PCP visit 01-01-2019 and next PCP visit 01-25-2019. Member denies s/s of fever, n/v, flu like symptoms.  Meds: Member states her medications are delivered to her home and she is able to take own meds.   Falls: Member states she attempted to go to the bathroom at 0200, stood up and became dizzy and fell, hit her elbow, head, and shoulder, did not seek medical attention until next MD office appointment; however states no injuries at current time. Member denies scattered rugs, has night light. Education provided. Member denies need for cane and walker on daily basis.  Hobbies: Member states she exercises every morning via scrunches and stretching for 20 minutes; likes to play guitar; quilting; gardening; canning goods; however, has not been active with activities for some time.   Anxiety: Member states she becomes anxious at times and has requested anxiety medication form PCP; however, has not been prescribed anything due to risks per member. 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 PMH: Depression. Takes Cymbalta. Denies suicidal ideations. States level 10 on scale 0-10 on willingness to improve health and states belief that health status is "excellent".  Lasix: member states she takes Lasix 40 mg once daily due to LE swelling at times per member; however,  denies current swelling.   Plan: Will follow up with member next week to complete initial assessment and assist with battery for w/c.  THN CM Care Plan Problem One     Most Recent Value  Care Plan Problem One  Risk for falls AMB history of falls  Role Documenting the Problem One  Care [place] [facility] for Problem One  Active  THN Long Term Goal   Member will not experience falls at home for next 45 days  THN Long Term Goal Start Date  01/13/19  Interventions for Problem One Long Term Goal  Assessed recent fall history  THN CM Short Term Goal #1   Member will use ambulatory devices when needed for next 30 days  THN CM Short Term Goal #1 Start Date  01/13/19  Interventions for Short Term  Goal #1  Assessed for ambulatory devices at home for use  [facility] CM Short Term Goal #2   Member will order battery for electric wheel chair by next 14 days  THN CM Short Term Goal #2 Start Date  01/13/19  Interventions for Short Term Goal #2  Schedule call with member for next week to assist with ordering batttery

## 2019-01-14 ENCOUNTER — Ambulatory Visit: Payer: Medicare Other

## 2019-01-18 ENCOUNTER — Ambulatory Visit: Payer: Self-pay

## 2019-01-18 ENCOUNTER — Other Ambulatory Visit: Payer: Self-pay | Admitting: Family Medicine

## 2019-01-18 MED ORDER — MECLIZINE HCL 25 MG PO TABS: 25.0000 mg | ORAL_TABLET | Freq: Three times a day (TID) | ORAL | 0 refills | Status: DC | PRN

## 2019-01-18 NOTE — Telephone Encounter (Signed)
Ok to refill this medication and have her follow up if not improving.

## 2019-01-18 NOTE — Telephone Encounter (Signed)
Meclizine is not on patient's med list. Please let me know dosing and directions

## 2019-01-18 NOTE — Telephone Encounter (Signed)
Pt called and is requesting a refill on her meclizine 25mg  pt needs if sent to the [facility] [place], [place] AT [place]

## 2019-01-19 ENCOUNTER — Other Ambulatory Visit: Payer: Self-pay

## 2019-01-19 ENCOUNTER — Telehealth: Payer: Self-pay

## 2019-01-19 NOTE — Telephone Encounter (Signed)
Ok to give her an order for a new battery. I will sign off on it. Thanks.

## 2019-01-19 NOTE — Telephone Encounter (Signed)
Patient called and stated she believe her batter in her power wheel chair is dead and that she needs a order to get a new battery from [employer]. She stated the name of her power wheelchair is [employer]. Please advise.

## 2019-01-19 NOTE — Telephone Encounter (Signed)
Pt [facility] wheel[facility]chair [facility] in [place]. Pt doesn't use this unless she has to go out for a long period of time. She went to try to start it and it wouldn't do anything. The place closes at 3:30pm during the week so I am unable to get a fax number at this time.

## 2019-01-19 NOTE — Patient Outreach (Addendum)
Sheryl Curtis) Care Management  01/19/2019  Sheryl Curtis 1932-02-23 [id number]   Called member at preferred number and member answered phone. HIPPA identifiers verified.   Member states she is doing well and denies c/o fever and states that "I have SOB all the time because of my COPD". Member states that she continues compliance with taking her medications as ordered to include her breathing nebulizer treatments. Member denies any current problems. Member states knowledge of proper handwashing and education reinforced concerning importance of handwashing and avoiding large crowds. Member states she has been at home for last week.   Transportation: Member denies need and states she is able to drive her own car and also has her daughter that will provide transportation if needed.   Member denies any medication changes.  Member denies any recent falls. Member provided RN CM with phone number for [employer] [phone] and RN CM, with member on 3 way call, dialed number for member and member left own voice mail message requesting return call regarding need for new battery for her w/c. After ending call, member stated that [name] previously request new order for w/c battery replacement. RN CM assisted member with 3 way call to PCP's office [phone] and spoke to [place]. Member explained to [place] that [employer] requesting order for battery for w/c. Genera assisted member and requested for member to return call with name of specific w/c so that PCP may write order for correct battery. Member verbalized agreement and understanding. RN CM reinforced information to member and member stated she will return call to PCP with information. General was provided with number to [place].   Member states that her daughter comes daily to assist with needs and is currently at [employer] [facility] home and states that her daughter picks up needed groceries and brings to  [employer] home.   Member denies any other current needs.   Will send Barriers Letter, Care Plan, Fall Assessment, Depression Assessment, and Initial Assessment to PCP. Will send EMMI Fall Prevention Education, [facility] Calendar, Business Card to [employer] home. Will follow up with member next week.  THN CM Care Plan Problem One     Most Recent Value  Care Plan Problem One  Risk for falls AMB history of falls  Role Documenting the Problem One  Care Management [name] for Problem One  Active  THN Long Term Goal   Member will not experience falls at home for next 45 days  THN Long Term Goal Start Date  01/13/19  Interventions for Problem One Long Term Goal  Assed for recent falls  THN CM Short Term Goal #1   Member will use ambulatory devices when needed for next 30 days  THN CM Short Term Goal #1 Start Date  01/13/19  Interventions for Short Term Goal #1  Reinforced education concerning fall prevention: Assessed for use of ambulatory aids  THN CM Short Term Goal #2   Member will order battery for electric wheel chair by next 14 days  THN CM Short Term Goal #2 Start Date  01/13/19  Interventions for Short Term Goal #2  Assisted member with call to PCP for order for battery for w/c

## 2019-01-19 NOTE — Telephone Encounter (Signed)
Please find out more information about this. I did not know she had this.  Why does she have a power wheelchair and who ordered this for her in the past? How long has she had it and where did she get it from?

## 2019-01-19 NOTE — Telephone Encounter (Signed)
I will fax this on Thursday when I get the fax number

## 2019-01-21 ENCOUNTER — Telehealth: Payer: Self-pay | Admitting: Interventional Cardiology

## 2019-01-21 NOTE — Telephone Encounter (Signed)
I have faxed over order for battery to [phone]. They will call her and go over everything but right now, they will not be going out to homes to repair anything

## 2019-01-21 NOTE — Telephone Encounter (Signed)
  Patient wants to see if Dr Irish Lack would write her a script for Xanax 25 mg PRN. She said that she doesn't take them often but likes to have them when she gets "nervous". She switched to a new PCP and they will not write her a script. Please let her know.

## 2019-01-21 NOTE — Telephone Encounter (Signed)
Left message for patient to call back  

## 2019-01-25 ENCOUNTER — Ambulatory Visit: Payer: Medicare Other | Admitting: Family Medicine

## 2019-01-29 ENCOUNTER — Other Ambulatory Visit: Payer: Self-pay

## 2019-01-29 NOTE — Patient Outreach (Signed)
[facility]) Care Management  01/29/2019  Sheryl Curtis June 24, 1932 [id number]   Called member at preferred number and member answered phone. HIPPA identifiers verified.   Member states she is doing well and denies any fall. Member states she is using both her walker at times and her cane. Member states her battery has not arrived yet for her w/c. Member states she does not currently need her w/c but would prefer to have it available if needed.  Proper handwashing and education reinforced and social distancing. Member states she has been at home for last week.   Meals: Member denies need for food and states her daughter provides groceries. Member states she is able to prepare her own meals.   Transportation: Member denies need and states she is able to drive her own car and also has her daughter that will provide transportation if needed.   Member denies any medication changes.  Member denies any current needs at this time. Will follow up with member concerning w/c battery next week.  [name], RN-BSN  [facility] Care Management  Community Care Management Coordinator  [phone] [email or web address]

## 2019-01-30 ENCOUNTER — Other Ambulatory Visit: Payer: Self-pay | Admitting: Family Medicine

## 2019-01-30 ENCOUNTER — Other Ambulatory Visit: Payer: Self-pay | Admitting: Pulmonary Disease

## 2019-02-01 NOTE — Telephone Encounter (Signed)
Is this okay to refill? 

## 2019-02-01 NOTE — Telephone Encounter (Signed)
Refuse. She is aware that I am not prescribing this for her due to risk vs benefit. We discussed this in the past on multiple occasions. Thanks.

## 2019-02-02 ENCOUNTER — Other Ambulatory Visit: Payer: Self-pay

## 2019-02-02 NOTE — Patient Outreach (Signed)
[facility] [facility]) Care Management  02/02/2019  Sheryl Curtis Dec 06, 1931 [id number]   Unsuccessful Outreach x1.  Member stated her battery has not arrived yet for her w/c. Member states she does not currently need her w/c but would prefer to have it available if needed. Member requested for RN CM to call this week to assist with w/c battery if battery has not arrived yet.  Called member at preferred number and no answer. Called member's secondary number and no answer. HIPPA compliant voicemail message left for both numbers.   Will return call to member in 3-4 business days if no return call from member.   Sheryl Mola "ANN" [name], RN-BSN  [facility] Care Management  Community Care Management Coordinator  [phone] [email or web address]

## 2019-02-08 ENCOUNTER — Other Ambulatory Visit: Payer: Self-pay

## 2019-02-08 NOTE — Patient Outreach (Signed)
[facility]) Care Management  02/08/2019  Sheryl Curtis 1932/03/22 [id number]  Called member as agreed last week and member answered phone. HIPPA identifiers verified.  W/C battery: Member stated that she called the DME company and left voicemail message to include member's PCP phone number in order for DME company to received written order for battery. Member stated "she is as slow" and prefers to allow time for DME company to return phone call.   Back Pain: Member stated that she is unable to receive relief for her back pain level 8 on scale 0-10 and stated that Tylenol is not effective; per PCP "I can't take Advil or Aleve. Member stated that she has spoken to her PCP; however, pain continues to remain and member is unable to stand for short periods due to back pain and must sit back down.   Member requesting assistance. Pharmacy referral sent with [facility] permission.  COVID-19 education reinforced/updated and knowledge assessed of member. Member stated knowledge: "it is a virus like the flu; you run a temperature; it's killing lots of people". Member continued to confirm "I stay home and don't go anywhere; my family comes but we keep our distance; my daughter buys groceries; we have masks, soap and sanitizers". Reinforced education concerning signs and symptoms of COVID-19 and member stated "I have been coughing for years because of my allergy and the pollen". When asked what will member due if she experiences signs and symptoms of COVID-19 and member stated "I will call my doctor or go to the hospital". Member confirmed she is practicing good handwashing and social distancing. Provided member with opportunity for questions. Member denies further concerns.   Will follow up with member next month. Member encouraged to call with any concerns.   [name], RN-BSN  [facility] Care Management  Community Care Management Coordinator   [phone] [email or web address]

## 2019-02-09 ENCOUNTER — Other Ambulatory Visit: Payer: Self-pay | Admitting: Pharmacist

## 2019-02-09 NOTE — Patient Outreach (Signed)
[facility]) Care Management  [place]   02/09/2019  Sheryl Curtis 1931-12-04 [id number]  Reason for referral: Medication Review  Referral source: [facility] RN Current insurance: [facility]  PMHx includes but not limited to:   Chronic low back pain, A Fib, Asthma,CKD stage III, CAD, hypertension, GERD, OSA, and physical deconditioning.  Outreach:  Successful telephone call with patient.  HIPAA identifiers verified.   Subjective:  Patient reported being in a great deal of pain and that she wanted pain medication.  She said her lower back pain was 9/10 and that nothing works for her except pain medications. She reported trying and failing:  Tramadol, acetaminophen, heating pad, ice, pain patches, and all types of topical pain creams and ointments.  Objective: Lab Results  Component Value Date   CREATININE 1.52 (H) 01/01/2019   CREATININE 1.34 (H) 10/29/2018   CREATININE 1.56 (H) 08/03/2018    No results found for: HGBA1C  Lipid Panel     Component Value Date/Time   CHOL 212 (H) 01/01/2019 1540   TRIG 204 (H) 01/01/2019 1540   HDL 47 01/01/2019 1540   CHOLHDL 4.5 (H) 01/01/2019 1540   LDLCALC 124 (H) 01/01/2019 1540    BP Readings from Last 3 Encounters:  01/01/19 124/84  12/09/18 118/78  10/29/18 128/70    Allergies  Allergen Reactions  . Aspirin Anaphylaxis, Hives and Shortness Of Breath  . Hydroxyquinolines Hives  . Imipramine Hcl Other (See Comments)    UNKNOWN TO PATIENT  . Pamelor [Nortriptyline Hcl] Other (See Comments)    unknown  . Prednisone Shortness Of Breath and Itching    anxiety  . Procardia [Nifedipine] Other (See Comments)    Unknown   . Tramadol Swelling  . Ace Inhibitors Other (See Comments)    cough  . Influenza Vaccines   . Plaquenil [Hydroxychloroquine Sulfate] Rash    Medications Reviewed Today    Reviewed by Elayne Guerin, [facility] (Pharmacist) on 02/09/19 at 1106  Med List Status: <None>  Medication  Order Taking? Sig Documenting Provider Last Dose Status Informant  albuterol (PROVENTIL) (2.5 MG/3ML) 0.083% nebulizer solution [id number] Yes USE 1 VIAL VIA NEBULIZER EVERY 6 HOURS AS NEEDED Rigoberto Noel, MD Taking Active   Albuterol Sulfate (PROAIR RESPICLICK) 841 (90 Base) MCG/ACT AEPB [id number] Yes Inhale 1-2 puffs into the lungs every 6 (six) hours as needed (for shortness of breath and/or wheezing.). Rigoberto Noel, MD Taking Active   Ascorbic Acid (VITAMIN C) 1000 MG tablet [id number] Yes Take 1,000 mg by mouth daily. [provider] Taking Active Pharmacy Records           Med Note [place], [place] T   Fri Oct 18, 2016  1:37 PM)    budesonide-formoterol [facility]) 160-4.5 MCG/ACT inhaler [id number] Yes Inhale 2 puffs into the lungs 2 (two) times daily. Martyn Ehrich, NP Taking Active   CALCIUM PO [id number] Yes Take 1,200 mg by mouth daily.  [provider] Taking Active Pharmacy Records           Med Note Meda Coffee, LISA Carlean Jews   Sat Jul 01, 2015  5:07 PM)    cetirizine (ZYRTEC) 10 MG tablet [id number] Yes Take 10 mg by mouth daily. [provider] Taking Active Self  clopidogrel (PLAVIX) 75 MG tablet [id number] Yes TAKE 1 TABLET(75 MG) BY MOUTH EVERY OTHER DAY Jettie Booze, MD Taking Active   DULoxetine (CYMBALTA) 60 MG capsule [id number] Yes Take 60 mg by mouth  daily. [provider] Taking Active   furosemide (LASIX) 40 MG tablet [id number] Yes TAKE 1 TABLET BY MOUTH  DAILY Jettie Booze, MD Taking Active   ipratropium (ATROVENT) 0.03 % nasal spray [id number] Yes Place 2 sprays into both nostrils every 12 (twelve) hours. Martyn Ehrich, NP Taking Active   isosorbide mononitrate (IMDUR) 30 MG 24 hr tablet [id number] Yes TAKE 1 TABLET(30 MG) BY MOUTH DAILY Liliane Shi, PA-C Taking Active Pharmacy Records  meclizine (ANTIVERT) 25 MG tablet [id number] Yes Take 1 tablet (25 mg total) by mouth 3 (three) times daily as needed for dizziness. Girtha Rm, NP-C Taking Active   Nebulizers (COMPRESSOR NEBULIZER) MISC [id number] Yes 1 Device by Does not apply route once. Deneise Lever, MD Taking Active Pharmacy Records  nitroGLYCERIN (NITROSTAT) 0.4 MG SL tablet [id number] Yes Place 1 tablet (0.4 mg total) under the tongue every 5 (five) minutes as needed for chest pain. Jettie Booze, MD Taking Active Pharmacy Records           Med Note Genia Hotter Oct 27, 2017  3:52 PM)    Omega-3 Fatty Acids (FISH OIL) 1000 MG CAPS [id number] Yes Take by mouth. [provider] Taking Active   potassium chloride SA (K-DUR,KLOR-CON) 20 MEQ tablet [id number] Yes TAKE 1 TABLET BY MOUTH  DAILY Jettie Booze, MD Taking Active   Respiratory Therapy Supplies (FLUTTER) DEVI [id number] No Use flutter device 3 times a day  Patient not taking:  Reported on 02/09/2019   Martyn Ehrich, NP Not Taking Active   valsartan-hydrochlorothiazide (DIOVAN-HCT) 160-12.5 MG tablet [id number] Yes Take 1 tablet by mouth daily. [provider] Taking Active           Assessment:  Drugs sorted by system:  Neurologic/Psychologic: Duloxetine,   Cardiovascular: Clopidogrel, Furosemide, Isosorbide MN, Nitroglycerin, Valsartan-HCTZ  Pulmonary/Allergy: ProAir, Albuterol Inhaler, Albuterol Nebulizer Solution, Symbicort  Gastrointestinal: Meclizine  Vitamins/Minerals/Supplements: Fish oil, Calcium, Vitamin C, Potassium  Chloride   Medication Review Findings:  . Cetirizine dose should be decreased to 5mg  daily due to the patient's renal function . Due to the patient's age, renal function and risk of falls, she is an unlikely candidate for opioid  therapy.  Chart review reveals she saw her PCP 01/01/2019 and was denied requested pain medication and benzos. . Patient was very adamant about acetaminophen not working and would not entertain scheduled acetaminophen therapy, topicals, heat or ice therapy because she said she tried them all  before and none of them worked. . A dexa scan from 2018 revealed the patient has osteopenia. She is taking Calcium but it is unclear if her supplement has vitamin D. A vitamin D level was not in the list of labs in epic. Vitamin D supplementation may be prudent if patient is not already taking as it may help with her pain.   . Magnesium level from 2016 was normal 2.4 (1.7-2.4). Not sure if one has been drawn more recently.  Some small studies have shown magnesium supplementation can reduce pain intensity and improve lumbar spine mobility in patients with refractory chronic low back pain. . Muscle relaxation may also be an option. (Low dose tizanidine). . Patient describes her pain as 9/10     Medication Assistance Findings:  No medication assistance needs identified  Plan: . Route note to patient's provider . Call [facility] Telephonic Nurse, Thressa Sheller. . Follow up with patient 1 week.   Elayne Guerin, PharmD, BCACP [facility] Clinical  Pharmacist [phone]

## 2019-02-16 ENCOUNTER — Ambulatory Visit: Payer: Self-pay | Admitting: Pharmacist

## 2019-02-16 ENCOUNTER — Other Ambulatory Visit: Payer: Self-pay | Admitting: Pharmacist

## 2019-02-16 NOTE — Patient Outreach (Signed)
[facility] [facility]) Care Management  02/16/2019  DEBORH PENSE May 06, 1932 [id number]   Patient was called to follow up on her pain. Her provider messaged me back last week that she had been referred for physical therapy to help with her pain and would be getting a referral for ortho or a neurologist to help with pan management.    HIPAA compliant message was left on the patient's voicemail.  Plan: Send patient an unsuccessful outreach letter. Call patient back in 5-7 business days.   Elayne Guerin, PharmD, [place] Clinical Pharmacist [phone]

## 2019-02-22 ENCOUNTER — Other Ambulatory Visit: Payer: Self-pay | Admitting: Pharmacist

## 2019-02-22 ENCOUNTER — Other Ambulatory Visit: Payer: Self-pay

## 2019-02-22 MED ORDER — ALBUTEROL SULFATE (2.5 MG/3ML) 0.083% IN NEBU: INHALATION_SOLUTION | RESPIRATORY_TRACT | 1 refills | Status: DC

## 2019-02-22 NOTE — Patient Outreach (Signed)
[place] [facility]) Care Management  02/22/2019  Sheryl Curtis 04/25/1932 [id number]   Patient called back from my call to her last week. HIPAA identifiers were obtained. Patient reported she feels tired and like she "wants to give out".  Her provider was messaged about her pain and said the patient had been referred for physical therapy.   Patient said the therapist called but she cannot have therapy due to COVID-19. She also expressed that she wanted to have her eyes checked but cannot because of COVID.  When asked what she thinks will work for her, she said she would like some "nerve pills".  She said her PCP told her she needs to have a physical first and has not been able to have a physical due to [place].  Patient's provider mentioned a referral back to rheumatology or her neurologist.  Plan: Close patient's pharmacy case. Spoke with [place] Nurse, Suzie Portela. I will gladly reopen the patient's case if necessary.  Elayne Guerin, PharmD, [place] Clinical Pharmacist [phone]

## 2019-02-24 ENCOUNTER — Ambulatory Visit: Payer: Self-pay | Admitting: Pharmacist

## 2019-03-08 ENCOUNTER — Telehealth: Payer: Self-pay | Admitting: Family Medicine

## 2019-03-08 ENCOUNTER — Telehealth: Payer: Self-pay | Admitting: Interventional Cardiology

## 2019-03-08 NOTE — Telephone Encounter (Signed)
LMTCB regarding appt with Dr. Irish Lack who is not DOD until 5/28 for in office visit. Can offer pt OV on 5/28 or virtual before then.

## 2019-03-08 NOTE — Telephone Encounter (Signed)
Sheryl Curtis was called back and advised her placard was ready and mail it to her and then I gave her phone number to [place] supply in [place] to call and see when they were going to start going out to houses again to repair things

## 2019-03-08 NOTE — Telephone Encounter (Signed)
Pt is requesting an appt to see Dr. Irish Lack  Or a PA in person. She states she is having chest discomfort, and would like to be treated in person. She thinks August is too long to wait.   Pt c/o of Chest Pain: STAT if CP now or developed within 24 hours  1. Are you having CP right now? No. At night   2. Are you experiencing any other symptoms (ex. SOB, nausea, vomiting, sweating)? SOB, but pt also has COPD  3. How long have you been experiencing CP? 3-4 weeks  4. Is your CP continuous or coming and going? Off and on, mostly at night  5. Have you taken Nitroglycerin? yes

## 2019-03-08 NOTE — Telephone Encounter (Signed)
Left message for pt to call me back 

## 2019-03-08 NOTE — Telephone Encounter (Signed)
Please check on this. Thanks!

## 2019-03-08 NOTE — Telephone Encounter (Signed)
Pt's daughter dropped off a parking placard to be completed sending back. ALSO  She states the we were to be checking on a wheelchair for pt and she would like status of that. Please call [phone].

## 2019-03-09 ENCOUNTER — Other Ambulatory Visit: Payer: Self-pay

## 2019-03-09 NOTE — Telephone Encounter (Signed)
Called and spoke to patient. She states that for the past 3-4 weeks she has been having tightness in her chest at night when she lays down. She states that she is able to take NTG x1 with relief. She states that she has no energy. She states that she has SOB all of the time that she attributes to her COPD. She states that this has not worsened. She denies N/V, diaphoresis, palpitations, or any other Sx. No vitals available. Patient denies having any Sx at this time. Virtual Visit scheduled with Ermalinda Barrios, PA tomorrow. Patient will have her vitals available at that time.      Virtual Visit Pre-Appointment Phone Call  TELEPHONE CALL NOTE  Sheryl Curtis has been deemed a candidate for a follow-up tele-health visit to limit community exposure during the Covid-19 pandemic. I spoke with the patient via phone to ensure availability of phone/video source, confirm preferred email & phone number, and discuss instructions and expectations.  I reminded [name] to be prepared with any vital sign and/or heart rhythm information that could potentially be obtained via home monitoring, at the time of her visit. I reminded [name] to expect a phone call prior to her visit.  Patient agrees with consent below.   Cleon Gustin, RN 03/09/2019 10:27 AM   FULL LENGTH CONSENT FOR TELE-HEALTH VISIT   I hereby voluntarily request, consent and authorize [facility] and its employed or contracted physicians, physician assistants, nurse practitioners or other licensed health care professionals (the Practitioner), to provide me with telemedicine health care services (the "Services") as deemed necessary by the treating Practitioner. I acknowledge and consent to receive the Services by the Practitioner via telemedicine. I understand that the telemedicine visit will involve communicating with the Practitioner through live audiovisual communication technology and the disclosure of certain medical information  by electronic transmission. I acknowledge that I have been given the opportunity to request an in-person assessment or other available alternative prior to the telemedicine visit and am voluntarily participating in the telemedicine visit.  I understand that I have the right to withhold or withdraw my consent to the use of telemedicine in the course of my care at any time, without affecting my right to future care or treatment, and that the Practitioner or I may terminate the telemedicine visit at any time. I understand that I have the right to inspect all information obtained and/or recorded in the course of the telemedicine visit and may receive copies of available information for a reasonable fee.  I understand that some of the potential risks of receiving the Services via telemedicine include:  Marland Kitchen Delay or interruption in medical evaluation due to technological equipment failure or disruption; . Information transmitted may not be sufficient (e.g. poor resolution of images) to allow for appropriate medical decision making by the Practitioner; and/or  . In rare instances, security protocols could fail, causing a breach of personal health information.  Furthermore, I acknowledge that it is my responsibility to provide information about my medical history, conditions and care that is complete and accurate to the best of my ability. I acknowledge that Practitioner's advice, recommendations, and/or decision may be based on factors not within their control, such as incomplete or inaccurate data provided by me or distortions of diagnostic images or specimens that may result from electronic transmissions. I understand that the practice of medicine is not an exact science and that Practitioner makes no warranties or guarantees regarding treatment outcomes. I acknowledge that  I will receive a copy of this consent concurrently upon execution via email to the email address I last provided but may also request a printed  copy by calling the office of [place].    I understand that my insurance will be billed for this visit.   I have read or had this consent read to me. . I understand the contents of this consent, which adequately explains the benefits and risks of the Services being provided via telemedicine.  . I have been provided ample opportunity to ask questions regarding this consent and the Services and have had my questions answered to my satisfaction. . I give my informed consent for the services to be provided through the use of telemedicine in my medical care  By participating in this telemedicine visit I agree to the above.

## 2019-03-09 NOTE — Patient Outreach (Signed)
[place] [facility]) Care Management  03/09/2019  Sheryl Curtis Aug 28, 1932 [id number]   Called member at preferred number and member answered phone. HIPPA identifiers verified.   Discussed [facility] consent forms and member states she does not know if her daughter has received forms in the mail. Member requested for RN CM to resend consents via mail to her home. Member voiced preference to continue with monthly phone calls.   COVID-19- member states she went out yesterday with her daughter driving and she wore her mask and practicing social distancing. Member states her daughter provides food and denies any needs or concerns.   Appointments: Member states she had to have her eyes checked yesterday and no additional problems noted. Member states she received the new equipment for her nebulizer machine and she has refill filled and her inhalers are filled. Member states she completed paperwork for her handicap placard.  W/C battery- member states she is still awaiting return call from [place]. [name] assisting with process.  Pain: Member reports she had Chest Tightness about 2 weeks ago and she took nitro tabs x 2 rounds "just in case" and states she received relief. Member states she called her Cardiologist and has a virtual appointment scheduled tomorrow with Cardiologist. Member denies any currently pain or chest tightness. Member states she has been using CBD oil under tongue that is assisting with her pain and states it is effective with her 3 different types of arthritis.   Falls: member denies recent falls. Member states she has not began with Physical Therapy and states she purchased an elliptical machine yesterday in order to work her legs and states she has to sit on it like a bike. Member states that her son will pick the machine up tomorrow. Member states preference to work out at home versus going to a gym. Member states she continues to  experience SOB with exertion and she ambulates with assistance of her rollator to receive rest as needed.  Medications: member denies any changes.  Member shared stories about when she was younger and how she enjoyed spending time with her grandmother. Member laughing at times throughout conversation. Member made aware to contact RN CM with any concerns.  Will resend [facility] consents to member via mail. Will follow up with member next month.   THN CM Care Plan Problem One     Most Recent Value  Care Plan Problem One  Risk for falls AMB history of falls  Role Documenting the Problem One  Care Management [place] for Problem One  Active  THN Long Term Goal   Member will not experience falls at home for next 45 days  THN Long Term Goal Start Date  01/13/19  Interventions for Problem One Long Term Goal  Assessed for recent fall  THN CM Short Term Goal #1   Member will use ambulatory devices when needed for next 30 days  THN CM Short Term Goal #1 Start Date  01/13/19  Interventions for Short Term Goal #1  Encouraged member to continue use of rollator as ordered  [place] CM Short Term Goal #2   Member will order battery for electric wheel chair by next 14 days  THN CM Short Term Goal #2 Start Date  01/13/19  Interventions for Short Term Goal #2  Discussed calls to [place] Problem Two     Most Recent Value  Care Plan Problem Two  "  Knowledge Deficits related to COVID-19 and impact on patient self-Health management  Role Documenting the Problem Two  Care Management [place] for Problem Two  Active  Interventions for Problem Two Long Term Goal   Encouraged member to discuss strategies used to protect self during COVID-19  [place] Term Goal  Member will not be admitted to hospital for next 45 days related t COVID  THN Long Term Goal Start Date  02/08/19  [facility] CM Short Term Goal #1   "Over the next 30 days, patient will verbalize basic understanding of  COVID-19 impact on individual health and self-health management as evidenced by verbalization of basic understanding of COVID-19 as a viral disease, measures to prevent exposure, signs and symptoms, when to contact provider  [facility] CM Short Term Goal #1 Start Date  02/08/19  Interventions for Short Term Goal #2   Provided praise to member as member stated she used her mask while out yesterday and is washing hands and is practicing social distancing      [name], RN-BSN  [place] Management  Community Care Management Coordinator  [phone] [email or web address]

## 2019-03-10 ENCOUNTER — Other Ambulatory Visit: Payer: Self-pay | Admitting: Physician Assistant

## 2019-03-10 ENCOUNTER — Telehealth (INDEPENDENT_AMBULATORY_CARE_PROVIDER_SITE_OTHER): Payer: Medicare Other | Admitting: Physician Assistant

## 2019-03-10 ENCOUNTER — Encounter: Payer: Self-pay | Admitting: Physician Assistant

## 2019-03-10 VITALS — BP 143/82 | HR 56 | Ht 61.5 in | Wt 200.0 lb

## 2019-03-10 DIAGNOSIS — I10 Essential (primary) hypertension: Secondary | ICD-10-CM

## 2019-03-10 DIAGNOSIS — G4733 Obstructive sleep apnea (adult) (pediatric): Secondary | ICD-10-CM

## 2019-03-10 DIAGNOSIS — I25119 Atherosclerotic heart disease of native coronary artery with unspecified angina pectoris: Secondary | ICD-10-CM

## 2019-03-10 DIAGNOSIS — R0600 Dyspnea, unspecified: Secondary | ICD-10-CM

## 2019-03-10 DIAGNOSIS — F419 Anxiety disorder, unspecified: Secondary | ICD-10-CM

## 2019-03-10 DIAGNOSIS — I5032 Chronic diastolic (congestive) heart failure: Secondary | ICD-10-CM

## 2019-03-10 DIAGNOSIS — R0609 Other forms of dyspnea: Secondary | ICD-10-CM

## 2019-03-10 DIAGNOSIS — E782 Mixed hyperlipidemia: Secondary | ICD-10-CM

## 2019-03-10 MED ORDER — ISOSORBIDE MONONITRATE ER 60 MG PO TB24: 60.0000 mg | ORAL_TABLET | Freq: Every day | ORAL | 3 refills | Status: DC

## 2019-03-10 MED ORDER — FUROSEMIDE 40 MG PO TABS: 40.0000 mg | ORAL_TABLET | Freq: Every day | ORAL | 3 refills | Status: DC

## 2019-03-10 MED ORDER — ATORVASTATIN CALCIUM 40 MG PO TABS: 40.0000 mg | ORAL_TABLET | Freq: Every day | ORAL | 0 refills | Status: DC

## 2019-03-10 NOTE — Patient Instructions (Signed)
Medication Instructions:  Your physician has recommended you make the following change in your medication:   1. INCREASE: isosorbide mononitrate (imdur) to 60 mg daily  2. TAKE: furosemide 40 mg tablet: Take 2 tablets today and tomorrow and then take 1 tablet daily everyday after that  3. START: atorvastatin (lipitor) 40 mg once a day  Lab work: Your physician recommends that you return for a FASTING lipid profile and liver function on 05/13/19 at 10:00 AM   If you have labs (blood work) drawn today and your tests are completely normal, you will receive your results only by: Marland Kitchen MyChart Message (if you have MyChart) OR . A paper copy in the mail If you have any lab test that is abnormal or we need to change your treatment, we will call you to review the results.  Testing/Procedures: None ordered  Follow-Up: Follow up with Ermalinda Barrios, PA via VIDEO Visit on 04/07/19 at 1:00 PM  Any Other Special Instructions Will Be Listed Below (If Applicable).

## 2019-03-10 NOTE — Progress Notes (Signed)
Virtual Visit via Video Note   This visit type was conducted due to national recommendations for restrictions regarding the COVID-19 Pandemic (e.g. social distancing) in an effort to limit this patient's exposure and mitigate transmission in our community.  Due to her co-morbid illnesses, this patient is at least at moderate risk for complications without adequate follow up.  This format is felt to be most appropriate for this patient at this time.  All issues noted in this document were discussed and addressed.  A limited physical exam was performed with this format.  Please refer to the patient's chart for her consent to telehealth for [facility].   Date:  03/10/2019   ID:  Sheryl Curtis, DOB Dec 13, 1931, MRN [id number]  Patient Location: Home Provider Location: Home  PCP:  Girtha Rm, NP-C  Cardiologist:  Larae Grooms, MD  Electrophysiologist:  None   Evaluation Performed:  Follow-Up Visit  Chief Complaint:  Follow up  History of Present Illness:    Sheryl Curtis is a 85 y.o. female with with history of CAD S/P DES LAD 2014, HTN, Palpitations with negative monitor in the past.Low risk myoview in 2016.   Last saw Dr. Irish Lack 06/2018 and was having some typical and atypical chest pain relieved with NTG and shortness of breath felt partly due to deconditioning.Marland Kitchen He ordered an echo that showed normal LVEF 65-70% with grade 1 DD and CXR normal. LDL 124 trig 204 01/01/19  Complaining of a stinging chest pain about an inch long at times. Chest gets tight when she lays down at night. Happened 3 times in the past 3 weeks.Was under a lot of stress. Some leg selling.  Walks around the house without chest tightness. Patient has sleep apnea but didn't tolerate CPAP so doesn't use.   The patient does not have symptoms concerning for COVID-19 infection (fever, chills, cough, or new shortness of breath).    Past Medical History:  Diagnosis Date  . ALLERGIC RHINITIS   . Atrial  fibrillation ([place])   . CANCER, COLORECTAL   . CHEST PAIN, ATYPICAL   . CKD (chronic kidney disease)   . DYSPNEA   . Essential hypertension, benign   . FIBROMYALGIA   . G E R D   . History of stress test    Myoview 8/16:  EF 69%, anterior and apical defect c/w breast attenuation; Low Risk   . INSOMNIA   . Morbid obesity ([place])   . Other and unspecified angina pectoris    Past Surgical History:  Procedure Laterality Date  . ABDOMINAL HYSTERECTOMY    . BACK SURGERY    . BREAST SURGERY    . COLON SURGERY    . CORONARY STENT PLACEMENT    . PERCUTANEOUS CORONARY STENT INTERVENTION (PCI-S) N/A 05/19/2013   Procedure: PERCUTANEOUS CORONARY STENT INTERVENTION (PCI-S);  Surgeon: Jettie Booze, MD;  Location: [facility] CATH LAB;  Service: Cardiovascular;  Laterality: N/A;     Current Meds  Medication Sig  . albuterol (PROVENTIL) (2.5 MG/3ML) 0.083% nebulizer solution USE 1 VIAL VIA NEBULIZER EVERY 6 HOURS AS NEEDED  . Albuterol Sulfate (PROAIR RESPICLICK) 801 (90 Base) MCG/ACT AEPB Inhale 1-2 puffs into the lungs every 6 (six) hours as needed (for shortness of breath and/or wheezing.).  Marland Kitchen Ascorbic Acid (VITAMIN C) 1000 MG tablet Take 1,000 mg by mouth daily.  . budesonide-formoterol (SYMBICORT) 160-4.5 MCG/ACT inhaler Inhale 2 puffs into the lungs 2 (two) times daily.  Marland Kitchen CALCIUM PO Take 1,200 mg by  mouth daily.   . cetirizine (ZYRTEC) 10 MG tablet Take 10 mg by mouth daily.  . clopidogrel (PLAVIX) 75 MG tablet TAKE 1 TABLET(75 MG) BY MOUTH EVERY OTHER DAY  . DULoxetine (CYMBALTA) 60 MG capsule Take 60 mg by mouth daily.  . furosemide (LASIX) 40 MG tablet Take 1 tablet (40 mg total) by mouth daily.  Marland Kitchen ipratropium (ATROVENT) 0.03 % nasal spray Place 2 sprays into both nostrils every 12 (twelve) hours.  . isosorbide mononitrate (IMDUR) 60 MG 24 hr tablet Take 1 tablet (60 mg total) by mouth daily.  . meclizine (ANTIVERT) 25 MG tablet Take 1 tablet (25 mg total) by mouth 3 (three) times daily as  needed for dizziness.  . Nebulizers (COMPRESSOR NEBULIZER) MISC 1 Device by Does not apply route once.  . nitroGLYCERIN (NITROSTAT) 0.4 MG SL tablet Place 1 tablet (0.4 mg total) under the tongue every 5 (five) minutes as needed for chest pain.  . Omega-3 Fatty Acids (FISH OIL) 1000 MG CAPS Take by mouth.  . potassium chloride SA (K-DUR,KLOR-CON) 20 MEQ tablet TAKE 1 TABLET BY MOUTH  DAILY  . Respiratory Therapy Supplies (FLUTTER) DEVI Use flutter device 3 times a day  . valsartan-hydrochlorothiazide (DIOVAN-HCT) 160-12.5 MG tablet Take 1 tablet by mouth daily.  . [DISCONTINUED] furosemide (LASIX) 40 MG tablet TAKE 1 TABLET BY MOUTH  DAILY  . [DISCONTINUED] isosorbide mononitrate (IMDUR) 30 MG 24 hr tablet TAKE 1 TABLET(30 MG) BY MOUTH DAILY     Allergies:   Aspirin; Hydroxyquinolines; Imipramine hcl; Pamelor [nortriptyline hcl]; Prednisone; Procardia [nifedipine]; Tramadol; Ace inhibitors; Influenza vaccines; and Plaquenil [hydroxychloroquine sulfate]   Social History   Tobacco Use  . Smoking status: Former Smoker    Packs/day: 1.00    Years: 12.00    Pack years: 12.00    Types: Cigarettes    Start date: 10/28/1949    Last attempt to quit: 10/28/1962    Years since quitting: 56.4  . Smokeless tobacco: Never Used  Substance Use Topics  . Alcohol use: No  . Drug use: No     Family Hx: The patient's family history includes Bone cancer in her sister; CAD in her brother; Heart attack (age of onset: 74) in her brother; Throat cancer in her daughter.  ROS:   Please see the history of present illness.     All other systems reviewed and are negative.   Prior CV studies:   The following studies were reviewed today: Echo 06/2018 Study Conclusions   - Left ventricle: The cavity size was normal. Wall thickness was   increased in a pattern of moderate LVH. Systolic function was   vigorous. The estimated ejection fraction was in the range of 65%   to 70%. Wall motion was normal; there  were no regional wall   motion abnormalities. Doppler parameters are consistent with   abnormal left ventricular relaxation (grade 1 diastolic   dysfunction).     Labs/Other Tests and Data Reviewed:    EKG:    Recent Labs: 10/29/2018: Hemoglobin 12.5; Platelets 234.0; Pro B Natriuretic peptide (BNP) 58.0 01/01/2019: BUN 29; Creatinine, Ser 1.52; Potassium 4.4; Sodium 139   Recent Lipid Panel Lab Results  Component Value Date/Time   CHOL 212 (H) 01/01/2019 03:40 PM   TRIG 204 (H) 01/01/2019 03:40 PM   HDL 47 01/01/2019 03:40 PM   CHOLHDL 4.5 (H) 01/01/2019 03:40 PM   LDLCALC 124 (H) 01/01/2019 03:40 PM    Wt Readings from Last 3 Encounters:  03/10/19 200  lb (90.7 kg)  01/13/19 104 lb (47.2 kg)  01/01/19 203 lb 12.8 oz (92.4 kg)     Objective:    Vital Signs:  BP (!) 143/82   Pulse (!) 56   Ht 5' 1.5" (1.562 m)   Wt 200 lb (90.7 kg)   BMI 37.18 kg/m    VITAL SIGNS:  reviewed GEN:  no acute distress RESPIRATORY:  normal respiratory effort, symmetric expansion CARDIOVASCULAR:  lower extremity edema noted  ASSESSMENT & PLAN:    1. CAD DES 2014 typical and atypical chest pain. Dr. Irish Lack had discussed possible cath in the past. Patient reluctant to come in with covid19 but I reassured her that we'd do everything to keep her safe. Will increase Imdur 60 mg daily. F/u in 4 weeks. If still having symptoms will order lexiscan. A lot of her chest tightness is when she lays down and could be related to sleep apnea that is untreated. 2. Dyspnea on exertion/Chronic diastolic CHF-has some fluid on board-increase lasix 80 mg x 2 days then 40 mg daily. 2 gram sodium diet 3. Essential HTN BP up.will increase imdur 60 mg daily 4. HLD LDL 124 01/01/19 begin Lipitor 40 mg once daily flp in 2 months 5. OSA diagnosed last year but didn't tolerate CPAP 6. Anxiety-asking for Xanax. Has same Rx since 2014 so doesn't take it often. I told her she needs to get it from her PCP who had declined in  the past to fill it.  COVID-19 Education: The signs and symptoms of COVID-19 were discussed with the patient and how to seek care for testing (follow up with PCP or arrange E-visit).  The importance of social distancing was discussed today.  Time:   Today, I have spent 22:18 minutes with the patient with telehealth technology discussing the above problems.     Medication Adjustments/Labs and Tests Ordered: Current medicines are reviewed at length with the patient today.  Concerns regarding medicines are outlined above.   Tests Ordered: Orders Placed This Encounter  Procedures  . Hepatic function panel  . Lipid panel    Medication Changes: Meds ordered this encounter  Medications  . isosorbide mononitrate (IMDUR) 60 MG 24 hr tablet    Sig: Take 1 tablet (60 mg total) by mouth daily.    Dispense:  90 tablet    Refill:  3  . furosemide (LASIX) 40 MG tablet    Sig: Take 1 tablet (40 mg total) by mouth daily.    Dispense:  90 tablet    Refill:  3  . atorvastatin (LIPITOR) 40 MG tablet    Sig: Take 1 tablet (40 mg total) by mouth daily.    Dispense:  30 tablet    Refill:  0    Disposition:  Follow up in 1 month(s) Ermalinda Barrios PA-C  Signed, Ermalinda Barrios, PA-C  03/10/2019 3:01 PM    [facility]

## 2019-03-11 ENCOUNTER — Telehealth: Payer: Self-pay | Admitting: Family Medicine

## 2019-03-11 NOTE — Telephone Encounter (Signed)
[name] the pt daughter called and spoke to [place],  [name] was demanding we write her mom Xanax.  Marguarite Arbour tried to talk with [name] and then she gave me the phone.  I spoke with Vickie.  Vickie advised from day one that this was not safe for her mom at this age and for this length of time.  [name] had advised that the cardiologist said she needed to be on this medication.  I spoke with [name] explaining again why Loletha Carrow is not willing to write and the cardiologist can write this med.  Our only option would be to refer her mom for therapy.  [name] will call the cardiologist for Xanax.

## 2019-03-15 NOTE — Telephone Encounter (Signed)
I spoke with daughter and daughter will have cardiology write the rx, since pt states cardiology is wanting patient on this med.

## 2019-04-07 ENCOUNTER — Encounter: Payer: Medicare Other | Admitting: Physician Assistant

## 2019-04-07 ENCOUNTER — Other Ambulatory Visit: Payer: Self-pay

## 2019-04-07 NOTE — Progress Notes (Signed)
Date:  04/07/2019   ID:  Sheryl Curtis, DOB August 17, 1932, MRN [id number]    PCP:  Girtha Rm, NP-C  Cardiologist:  Larae Grooms, MD   Electrophysiologist:  None   Evaluation Performed:    Chief Complaint:  History of Present Illness:    Sheryl Curtis is a 85 y.o. female with  history of CAD S/P DES LAD 2014, HTN, Palpitations with negative monitor in the past.Low risk myoview in 2016.    Last saw Dr. Irish Lack 06/2018 and was having some typical and atypical chest pain relieved with NTG and shortness of breath felt partly due to deconditioning.Marland Kitchen He ordered an echo that showed normal LVEF 65-70% with grade 1 DD and CXR normal. LDL 124 trig 204 01/01/19   I had a tele-visit with the patient 03/10/2019 at which time she was having chest pain both typical and atypical.  Dr. Irish Lack had discussed possible cath in the past but patient was reluctant to come in with COVID-19.  I increased her Imdur to 60 mg daily with plans for Lexiscan if she was still having symptoms.  I also increased her Lasix to 80 mg for 2 days then back to 40 mg daily.  The patient  have symptoms concerning for COVID-19 infection (fever, chills, cough, or new shortness of breath).    Past Medical History:  Diagnosis Date  . ALLERGIC RHINITIS   . Atrial fibrillation ([place])   . CANCER, COLORECTAL   . CHEST PAIN, ATYPICAL   . CKD (chronic kidney disease)   . DYSPNEA   . Essential hypertension, benign   . FIBROMYALGIA   . G E R D   . History of stress test    Myoview 8/16:  EF 69%, anterior and apical defect c/w breast attenuation; Low Risk   . INSOMNIA   . Morbid obesity ([place])   . Other and unspecified angina pectoris    Past Surgical History:  Procedure Laterality Date  . ABDOMINAL HYSTERECTOMY    . BACK SURGERY    . BREAST SURGERY    . COLON SURGERY    . CORONARY STENT PLACEMENT    . PERCUTANEOUS CORONARY STENT INTERVENTION (PCI-S) N/A 05/19/2013   Procedure: PERCUTANEOUS CORONARY STENT  INTERVENTION (PCI-S);  Surgeon: Jettie Booze, MD;  Location: [facility] CATH LAB;  Service: Cardiovascular;  Laterality: N/A;     No outpatient medications have been marked as taking for the 04/07/19 encounter (Telemedicine) with Imogene Burn, PA-C.     Allergies:   Aspirin; Hydroxyquinolines; Imipramine hcl; Pamelor [nortriptyline hcl]; Prednisone; Procardia [nifedipine]; Tramadol; Ace inhibitors; Influenza vaccines; and Plaquenil [hydroxychloroquine sulfate]   Social History   Tobacco Use  . Smoking status: Former Smoker    Packs/day: 1.00    Years: 12.00    Pack years: 12.00    Types: Cigarettes    Start date: 10/28/1949    Last attempt to quit: 10/28/1962    Years since quitting: 56.4  . Smokeless tobacco: Never Used  Substance Use Topics  . Alcohol use: No  . Drug use: No     Family Hx: The patient's family history includes Bone cancer in her sister; CAD in her brother; Heart attack (age of onset: 105) in her brother; Throat cancer in her daughter.  ROS:   Please see the history of present illness.     All other systems reviewed and are negative.   Prior CV studies:   The following studies were reviewed today:    Labs/Other  Tests and Data Reviewed:    EKG:    Recent Labs: 10/29/2018: Hemoglobin 12.5; Platelets 234.0; Pro B Natriuretic peptide (BNP) 58.0 01/01/2019: BUN 29; Creatinine, Ser 1.52; Potassium 4.4; Sodium 139   Recent Lipid Panel Lab Results  Component Value Date/Time   CHOL 212 (H) 01/01/2019 03:40 PM   TRIG 204 (H) 01/01/2019 03:40 PM   HDL 47 01/01/2019 03:40 PM   CHOLHDL 4.5 (H) 01/01/2019 03:40 PM   LDLCALC 124 (H) 01/01/2019 03:40 PM    Wt Readings from Last 3 Encounters:  03/10/19 200 lb (90.7 kg)  01/13/19 104 lb (47.2 kg)  01/01/19 203 lb 12.8 oz (92.4 kg)     Objective:    Vital Signs:  Ht 5' 1.5" (1.562 m)   BMI 37.18 kg/m      ASSESSMENT & PLAN:    1.   COVID-19 Education: The signs and symptoms of COVID-19 were  discussed with the patient and how to seek care for testing (follow up with PCP or arrange E-visit).  The importance of social distancing was discussed today.  Time:   Today, I have spent  minutes with the patient with telehealth technology discussing the above problems.     Medication Adjustments/Labs and Tests Ordered: Current medicines are reviewed at length with the patient today.  Concerns regarding medicines are outlined above.   Tests Ordered: No orders of the defined types were placed in this encounter.   Medication Changes: No orders of the defined types were placed in this encounter.   Disposition:  Follow up  Signed, Ermalinda Barrios, PA-C  04/07/2019 1:06 PM    [facility] This encounter was created in error - please disregard.

## 2019-04-07 NOTE — Patient Outreach (Signed)
[facility]) Care Management  04/07/2019  Mikhaela RUSSIE GULLEDGE [birth year] [id number]   Called member this morning for monthly call and person answering phone stated member is not currently available. Will call member at later time.  [name], RN-BSN  [facility] Care Management  Community Care Management Coordinator  [phone] [email or web address]

## 2019-04-14 ENCOUNTER — Other Ambulatory Visit: Payer: Self-pay

## 2019-04-14 NOTE — Patient Outreach (Addendum)
[facility]) Care Management  04/14/2019  Sheryl Curtis 05-06-1932 [id number]  Called member at preferred number and [name] answered phone. Member on phone and HIPPA identities verified.    Today's Vitals   04/14/19 1303  PainSc: 8   Back Pain: Patient presents for evaluation of low back problems. Member states pain level 8 on scale 0-10 and pain goal of "4-5". Member states pain related to arthritis and states that CBD oil and rest assists with alleviating pain. Member stated that she was provide an option via Provider for steroid injections; however, stated that she refuses.  Member stated she has been felling "washed out all the time with no energy". Member stated she has been ambulating outside in her yard; however, experienced a fall last Thursday per member and [name]. Re-educated member and educated member's daughter concerning [facility] with nurse on call line available for any concerns and family verbalized understanding and agreement to call or to call member's Provider for any health related concerns. Family also made aware to RN CM with and needs and phone number provided again today.  Member stated she received a call back from [place], [place] who stated that [employer] will not cover expense for battery for wheel chair. [facility] SW referral sent requesting to assist member with wheelchair repair or placement.  Fall Risk  04/14/2019 01/13/2019 10/29/2018 08/28/2015  Falls in the past year? '1 1 1 ' Yes  Comment member reports fall last Thursday - - -  Number falls in past yr: 1 0 0 1  Injury with Fall? 1 1 0 Yes  Comment - member states at 33, she attempted to go to the bathroom, when she stood up she was dizzy and fell. States she hit her head, shoulder and elbow; however, did not seek medical attention until next MD office visit. Denies any current concerns.  - -  Risk Factor Category  - - - High Fall Risk  Risk for fall due  to : History of fall(s);Impaired mobility;Other (Comment) History of fall(s);Impaired vision;Impaired balance/gait - History of fall(s)  Risk for fall due to: Comment back pain Member states she has a night light and denies use of scattered rugs - -  Follow up Falls evaluation completed;Education provided;Falls prevention discussed Falls evaluation completed;Education provided;Falls prevention discussed - Falls prevention discussed  Comment .Member states she was working in the garden and put a banana peeling under the rose bush and fell over and did not hit head; however, fell on her "Right cheek bone and R hand" and today R hand still aches. member refuses to follow up with PCP - - -   THN CM Care Plan Problem One     Most Recent Value  Care Plan Problem One  Risk for falls AMB history of falls  Role Documenting the Problem One  Care Management [place] for Problem One  Active  [facility] Long Term Goal   Member will not experience falls at home for next 45 days  THN Long Term Goal Start Date  04/14/19  Interventions for Problem One Long Term Goal  Educated member on importance to follow up with Provider for any fall related injuries,  Fall prevention strategies provided to member  THN CM Short Term Goal #1   Member will use ambulatory devices when needed for next 30 days  THN CM Short Term Goal #1 Start Date  01/13/19  Interventions for Short Term Goal #1  Reinforced  education on importance of using ambulatory devices as ordered,  will send SW referral to assist with w/c as reqeusted via member  THN CM Short Term Goal #2   Member will order battery for electric wheel chair by next 14 days  THN CM Short Term Goal #2 Start Date  01/13/19  [facility] CM Short Term Goal #2 Met Date  04/14/19  Interventions for Short Term Goal #2  Assessed member for response of DME company    [facility] CM Care Plan Problem Two     Most Recent Value  Care Plan Problem Two  "Knowledge Deficits related to COVID-19 and  impact on patient self-Health management  Role Documenting the Problem Two  Care Management [place] for Problem Two  Active  Interventions for Problem Two Long Term Goal   Discussed importance of continued compliance with COVID-19 restrictions such as wearing mask,  physical distancing,  20 seconds hand washing  THN Long Term Goal  Member will not be admitted to hospital for next 45 days related t COVID  THN Long Term Goal Start Date  04/14/19  [facility] Long Term Goal Met Date  04/14/19  THN CM Short Term Goal #1   "Over the next 30 days, patient will verbalize basic understanding of COVID-19 impact on individual health and self-health management as evidenced by verbalization of basic understanding of COVID-19 as a viral disease, measures to prevent exposure, signs and symptoms, when to contact provider  [facility] CM Short Term Goal #1 Start Date  02/08/19  [facility] CM Short Term Goal #1 Met Date   04/14/19  Interventions for Short Term Goal #2   Discussed COVID-19 restriction with member and [name],  Assessed member for any concerns related to COVID-19     Advance Directives: Member stated she is still working on paperwork for advance directives.  Appointments: Confirmed member's upcoming appointment for next month for lab on 05-13-2019 and Pulmonary on 06-14-2019.   Meals: Member and Sheryl Curtis both confirmed that Sheryl Curtis provides assistance with shopping and preparing meals for member and states member has food.  Transportation: Member denies transportation needs.  Medications: Sheryl Curtis stated that [facility] delivers medications to [facility] home. Denies any medication concerns.  COVID-19 restrictions: Member stated continued compliance with wearing facial mask; physical distancing; handwashing. Reinforcement provided.   Member and daughter verbalized agreement to follow up with member next month and denies any other needs at this time. Will send EMMI fall prevention education. SW  referral sent. Will send Quarterly update to Provider.  [name], RN-BSN  [facility] Care Management  Community Care Management Coordinator  [phone] [email or web address]

## 2019-04-16 ENCOUNTER — Other Ambulatory Visit: Payer: Self-pay

## 2019-04-16 NOTE — Telephone Encounter (Signed)
Pt was notified to consult cardiology about bp med

## 2019-04-16 NOTE — Telephone Encounter (Signed)
[facility] sent a request for Valsartan-hydrochlorothiazide. Is this appropriate?

## 2019-04-16 NOTE — Telephone Encounter (Signed)
She should consult her cardiologist for refills of her HTN medications.

## 2019-04-19 ENCOUNTER — Other Ambulatory Visit: Payer: Self-pay

## 2019-04-19 NOTE — Patient Outreach (Signed)
[facility]) Care Management  04/19/2019  ZARIN HAGMANN 05-16-1932 [id number]   Social work referral received from [facility], Thressa Sheller, to assist patient with wheelchair repair or replacement.   Successful outreach to patient today.  Patient reports that wheelchair is in need of a new battery.  BSW, patient, and daughter conducted call to [place] to inquire about coverage for DME.  Per Customer Service Representative, wheelchair battery is not covered.  Patient was provided with contact information for three suppliers if she wishes to replace the wheelchair all together. Per representative, patient would be responsible for 20% of cost of equipment.  Patient and daughter said they would call to inquire about cost.   BSW will follow up before the end of the week.  Ronn Melena, BSW Social Worker [phone]

## 2019-04-21 ENCOUNTER — Telehealth: Payer: Medicare Other | Admitting: Physician Assistant

## 2019-04-21 ENCOUNTER — Other Ambulatory Visit: Payer: Self-pay

## 2019-04-21 ENCOUNTER — Ambulatory Visit (INDEPENDENT_AMBULATORY_CARE_PROVIDER_SITE_OTHER): Payer: Medicare Other | Admitting: Family Medicine

## 2019-04-21 ENCOUNTER — Encounter: Payer: Self-pay | Admitting: Family Medicine

## 2019-04-21 VITALS — BP 140/70 | HR 76 | Temp 98.6°F | Ht 60.5 in | Wt 209.8 lb

## 2019-04-21 DIAGNOSIS — Z23 Encounter for immunization: Secondary | ICD-10-CM | POA: Diagnosis not present

## 2019-04-21 DIAGNOSIS — I10 Essential (primary) hypertension: Secondary | ICD-10-CM

## 2019-04-21 DIAGNOSIS — M542 Cervicalgia: Secondary | ICD-10-CM | POA: Diagnosis not present

## 2019-04-21 DIAGNOSIS — N189 Chronic kidney disease, unspecified: Secondary | ICD-10-CM

## 2019-04-21 DIAGNOSIS — G8929 Other chronic pain: Secondary | ICD-10-CM

## 2019-04-21 DIAGNOSIS — Z7189 Other specified counseling: Secondary | ICD-10-CM

## 2019-04-21 DIAGNOSIS — Z0000 Encounter for general adult medical examination without abnormal findings: Secondary | ICD-10-CM

## 2019-04-21 DIAGNOSIS — Z7185 Encounter for immunization safety counseling: Secondary | ICD-10-CM

## 2019-04-21 DIAGNOSIS — F419 Anxiety disorder, unspecified: Secondary | ICD-10-CM

## 2019-04-21 DIAGNOSIS — M159 Polyosteoarthritis, unspecified: Secondary | ICD-10-CM | POA: Diagnosis not present

## 2019-04-21 NOTE — Progress Notes (Signed)
Sheryl Curtis is a 85 y.o. female who presents for annual wellness visit and follow-up on chronic medical conditions.  She has the following concerns:  Complains of chronic left sided posterior neck pain that is worse with certain movements. Denies numbness, tingling or weakness of upper extremities.   Chronic hip and back pain- has seen Dr. Ernestina Patches in the past.   Anxiety - reports taking Xanax for years for anxiety. States she mainly feels anxious in the mornings.     Immunization History  Administered Date(s) Administered  . Pneumococcal Conjugate-13 11/23/2013  . Pneumococcal Polysaccharide-23 08/27/2007, 04/21/2019  . Tdap 08/04/2008  . Zoster Recombinat (Shingrix) 02/19/2017, 09/24/2017   Last Pap smear: years ago.  Last mammogram:  Years ago Last colonoscopy: 4-5 years ago  Last DEXA: 2018 Dentist: [facility]  Ophtho: Dr. Herbert Deaner Exercise: stationary bike   Other doctors caring for patient include: Dr. Irish Lack- cardiology Dr. Davonna Belling- Pulmonary Dr. Ernestina Patches- orthopedist     Depression screen:  See questionnaire below.  Depression screen [facility] 2/9 04/21/2019 01/13/2019 01/04/2019 10/29/2018 08/28/2015  Decreased Interest 0 1 0 0 0  Down, Depressed, Hopeless 0 1 0 0 0  PHQ - 2 Score 0 2 0 0 0  Altered sleeping - 1 - - -  Tired, decreased energy - 1 - - -  Change in appetite - 0 - - -  Feeling bad or failure about yourself  - 1 - - -  Trouble concentrating - 1 - - -  Moving slowly or fidgety/restless - 0 - - -  Suicidal thoughts - 0 - - -  PHQ-9 Score - 6 - - -  Difficult doing work/chores - Somewhat difficult - - -    Fall Risk Screen: see questionnaire below. Fall Risk  04/21/2019 04/14/2019 01/13/2019 10/29/2018 08/28/2015  Falls in the past year? 1 1 1 1  Yes  Comment - member reports fall last Thursday - - -  Number falls in past yr: 1 1 0 0 1  Injury with Fall? 1 1 1  0 Yes  Comment - - member states at 21, she attempted to go to the bathroom, when she stood up she  was dizzy and fell. States she hit her head, shoulder and elbow; however, did not seek medical attention until next MD office visit. Denies any current concerns.  - -  Risk Factor Category  - - - - High Fall Risk  Risk for fall due to : Other (Comment) History of fall(s);Impaired mobility;Other (Comment) History of fall(s);Impaired vision;Impaired balance/gait - History of fall(s)  Risk for fall due to: Comment swimmy headed back pain Member states she has a night light and denies use of scattered rugs - -  Follow up - Falls evaluation completed;Education provided;Falls prevention discussed Falls evaluation completed;Education provided;Falls prevention discussed - Falls prevention discussed  Comment - .Member states she was working in the garden and put a banana peeling under the rose bush and fell over and did not hit head; however, fell on her "Right cheek bone and R hand" and today R hand still aches. member refuses to follow up with PCP - - -    ADL screen:  See questionnaire below Functional Status Survey: Is the patient deaf or have difficulty hearing?: Yes Does the patient have difficulty seeing, even when wearing glasses/contacts?: Yes Does the patient have difficulty concentrating, remembering, or making decisions?: No Does the patient have difficulty walking or climbing stairs?: Yes Does the patient have difficulty dressing or bathing?: Yes  Does the patient have difficulty doing errands alone such as visiting a doctor's office or shopping?: No   End of Life Discussion:  Patient does not have a living will and medical power of attorney. Forms discussed and given for her to take home. Her daughter is with her. MOST form was discussed and filled out.   Review of Systems Constitutional: -fever, -chills, -sweats, -unexpected weight change, -anorexia, -fatigue Allergy: -sneezing, -itching, -congestion Dermatology: denies changing moles, rash, lumps, new worrisome lesions ENT: -runny nose,  -ear pain, -sore throat, -hoarseness, -sinus pain, -teeth pain, -tinnitus, -hearing loss, -epistaxis Cardiology:  -chest pain, -palpitations, -edema, -orthopnea Respiratory: -cough, -shortness of breath, +dyspnea on exertion, -wheezing, -hemoptysis Gastroenterology: -abdominal pain, -nausea, -vomiting, -diarrhea, -constipation, -blood in stool, -changes in bowel movement, -dysphagia Hematology: -bleeding or bruising problems Musculoskeletal: +arthralgias, -myalgias, -joint swelling, +back pain, +neck pain, -cramping, -gait changes Ophthalmology: -vision changes, -eye redness, -itching, -discharge Urology: -dysuria, -difficulty urinating, -hematuria, -urinary frequency, -urgencY Neurology: -headache, -weakness, -tingling, -numbness, -speech abnormality, -memory loss, +falls, +dizziness Psychology:  -depressed mood, -agitation, -sleep problems    PHYSICAL EXAM:  BP 140/70   Pulse 76   Temp 98.6 F (37 C) (Oral)   Ht 5' 0.5" (1.537 m)   Wt 209 lb 12.8 oz (95.2 kg)   SpO2 97%   BMI 40.30 kg/m   General Appearance: Alert, cooperative, no distress, appears stated age. Patient refused to get on exam table. She was examined in the chair.  Head: Normocephalic, without obvious abnormality, atraumatic Eyes: PERRL, conjunctiva/corneas clear, EOM's intact Ears: Normal TM's and external ear canals Nose: face mask in place due to pandemic  Throat: face mask in place  Neck: Supple, no lymphadenopathy; thyroid: no enlargement/tenderness/nodules Lungs: Clear to auscultation bilaterally without wheezes, rales or ronchi; respirations unlabored Chest Wall: No tenderness or deformity Heart: Regular rate and rhythm, S1 and S2 normal, no murmur, rub or gallop Breast Exam: refuses  Abdomen: Soft, non-tender, nondistended, normoactive bowel sounds Genitalia: refused  Extremities: No clubbing, cyanosis or edema Pulses: 2+ and symmetric all extremities Skin: Skin color, texture, turgor normal, no rashes  or lesions Lymph nodes: Cervical, supraclavicular Neurologic: CNII-XII intact, normal strength  Psych: Normal mood, affect, hygiene and grooming.  ASSESSMENT/PLAN: Medicare annual wellness visit, subsequent - Plan: intermittent dizziness and has fallen on one occasion. No recent falls. She is using a wheelchair more often.   Chronic kidney disease, unspecified CKD stage - Plan: keep blood pressure under good control and monitor  Neck pain, chronic - Plan: she will follow up with Dr. Ernestina Patches. She will call to schedule.   Generalized OA - Plan: follow up with Dr. Ernestina Patches   Essential hypertension, benign - Plan: continue current medications. Follow up with cardiology   Morbid obesity ([place]) - Plan: difficult for her to be active. Cut back on portion sizes and eat a healthy diet  Anxiety - Plan: discussed that Xanax is contraindicated for her age group and discussed risks of sedating medications including falls which is already an issue for her. I will give her a list of psychiatrists and therapists.   Advance directive discussed with patient - Plan: discussed Living Will and HCPOA with patient and daughter, forms provided and MOST form filled out.   Immunization counseling - Plan: pneumonia vaccine given. Prescription given for Tdap.   Need for vaccination against Streptococcus pneumoniae - Plan: Pneumococcal polysaccharide vaccine 23-valent greater than or equal to 2yo subcutaneous/IM. Vaccine given. Counseling done on vaccine.    Discussed monthly self  breast exams and yearly mammograms; at least 30 minutes of aerobic activity at least 5 days/week and weight-bearing exercise 2x/week; proper sunscreen use reviewed; healthy diet, including goals of calcium and vitamin D intake and alcohol recommendations (less than or equal to 1 drink/day) reviewed; regular seatbelt use; changing batteries in smoke detectors.  Immunization recommendations discussed.  Colonoscopy recommendations  reviewed   Medicare Attestation I have personally reviewed: The patient's medical and social history Their use of alcohol, tobacco or illicit drugs Their current medications and supplements The patient's functional ability including ADLs,fall risks, home safety risks, cognitive, and hearing and visual impairment Diet and physical activities Evidence for depression or mood disorders  The patient's weight, height, and BMI have been recorded in the chart.  I have made referrals, counseling, and provided education to the patient based on review of the above and I have provided the patient with a written personalized care plan for preventive services.     Harland Dingwall, NP-C   04/21/2019

## 2019-04-21 NOTE — Patient Instructions (Addendum)
Call and schedule with Dr. Ernestina Patches as discussed for your neck and head pain.   You can take the prescription for the Tdap to your pharmacy.   If you decide to fill out the advance directives, bring them in at your convenience.   You can call to schedule your appointment with the psychiatrist/counselor. A few offices are listed below for you to call.    [place] for a psychiatrist  [place]  (across from [facility])  [facility] [address] [place], [place] [address] [place], [place], [place] [address]  Phone: [phone]   [place], [place] [address]  Phone: [phone]  Preventive Care 65 Years and Older, Female Preventive care refers to lifestyle choices and visits with your health care provider that can promote health and wellness. What does preventive care include?  A yearly physical exam. This is also called an annual well check.  Dental exams once or twice a year.  Routine eye exams. Ask your health care provider how often you should have your eyes checked.  Personal lifestyle choices, including: ? Daily care of your teeth and gums. ? Regular physical activity. ? Eating a healthy diet. ? Avoiding tobacco and drug use. ? Limiting alcohol use. ? Practicing safe sex. ? Taking low-dose aspirin every day. ? Taking vitamin and mineral supplements as recommended by your health care provider. What happens during an annual well check? The services and screenings done by your health care provider during your annual well check will depend on your age, overall health, lifestyle risk factors, and family history of disease. Counseling Your health care provider may ask you questions about your:  Alcohol use.  Tobacco use.  Drug use.   Emotional well-being.  Home and relationship well-being.  Sexual activity.  Eating habits.  History of falls.  Memory and ability to understand (cognition).  Work and work Statistician.  Reproductive health.  Screening You may have the following tests or measurements:  Height, weight, and BMI.  Blood pressure.  Lipid and cholesterol levels. These may be checked every 5 years, or more frequently if you are over 83 years old.  Skin check.  Lung cancer screening. You may have this screening every year starting at age 83 if you have a 30-pack-year history of smoking and currently smoke or have quit within the past 15 years.  Colorectal cancer screening. All adults should have this screening starting at age 83 and continuing until age 55. You will have tests every 1-10 years, depending on your results and the type of screening test. People at increased risk should start screening at an earlier age. Screening tests may include: ? Guaiac-based fecal occult blood testing. ? Fecal immunochemical test (FIT). ? Stool DNA test. ? Virtual colonoscopy. ? Sigmoidoscopy. During this test, a flexible tube with a tiny camera (sigmoidoscope) is used to examine your rectum and lower colon. The sigmoidoscope is inserted through your anus into your rectum and lower colon. ? Colonoscopy. During this test, a long, thin, flexible tube with a tiny camera (colonoscope) is used to examine your entire colon and rectum.  Hepatitis C blood test.  Hepatitis B blood test.  Sexually transmitted disease (STD) testing.  Diabetes screening. This is done by checking your blood sugar (glucose)  after you have not eaten for a while (fasting). You may have this done every 1-3 years.  Bone density scan. This is done to screen for osteoporosis. You may have this done starting at age 83.  Mammogram. This may be done every 1-2 years. Talk to your health care provider about how often you should have regular  mammograms. Talk with your health care provider about your test results, treatment options, and if necessary, the need for more tests. Vaccines Your health care provider may recommend certain vaccines, such as:  Influenza vaccine. This is recommended every year.  Tetanus, diphtheria, and acellular pertussis (Tdap, Td) vaccine. You may need a Td booster every 10 years.  Varicella vaccine. You may need this if you have not been vaccinated.  Zoster vaccine. You may need this after age 83.  Measles, mumps, and rubella (MMR) vaccine. You may need at least one dose of MMR if you were born in 1957 or later. You may also need a second dose.  Pneumococcal 13-valent conjugate (PCV13) vaccine. One dose is recommended after age 83.  Pneumococcal polysaccharide (PPSV23) vaccine. One dose is recommended after age 83.  Meningococcal vaccine. You may need this if you have certain conditions.  Hepatitis A vaccine. You may need this if you have certain conditions or if you travel or work in places where you may be exposed to hepatitis A.  Hepatitis B vaccine. You may need this if you have certain conditions or if you travel or work in places where you may be exposed to hepatitis B.  Haemophilus influenzae type b (Hib) vaccine. You may need this if you have certain conditions. Talk to your health care provider about which screenings and vaccines you need and how often you need them. This information is not intended to replace advice given to you by your health care provider. Make sure you discuss any questions you have with your health care provider. Document Released: 11/10/2015 Document Revised: 12/04/2017 Document Reviewed: 08/15/2015 Elsevier Interactive Patient Education  2019 [employer].

## 2019-04-22 ENCOUNTER — Ambulatory Visit: Payer: Self-pay

## 2019-04-22 ENCOUNTER — Other Ambulatory Visit: Payer: Self-pay

## 2019-04-22 NOTE — Patient Outreach (Signed)
[facility]) Care Management  04/22/2019  Sheryl Curtis 1932-03-23 [id number]   Follow up call to patient regarding social work referral for replacement of wheelchair battery.  Three way call was conducted with patient and [employer] during last outreach and patient was informed that battery is not covered.  Patient and daughter were provided with contact information for three suppliers in order to determine out-of-pocket expense for replacement of chair.  Patient reported today that they have not yet contacted suppliers.  Patient reports upcoming MD appointment in which she will request an order for new chair.  BSW encouraged her to contact suppliers prior to appointment so that MD knows where to submit order.  BSW is closing case at this time but did encourage patient to call if additional needs/questions arise.  Ronn Melena, BSW Social Worker [phone]

## 2019-04-23 ENCOUNTER — Telehealth: Payer: Self-pay | Admitting: Physical Medicine and Rehabilitation

## 2019-04-23 NOTE — Telephone Encounter (Signed)
Patient called and left a message requesting an appointment with Dr. Ernestina Patches. She was last seen by him in  September of 2018, so she will need to schedule an OV. I called and left messages x2 on [phone] I also called her daughter (per patient request) at [phone]. No answer at this number, and voicemail not set up.

## 2019-04-27 ENCOUNTER — Ambulatory Visit: Payer: Medicare Other | Admitting: Family Medicine

## 2019-05-05 ENCOUNTER — Ambulatory Visit (INDEPENDENT_AMBULATORY_CARE_PROVIDER_SITE_OTHER): Payer: Medicare Other | Admitting: Physical Medicine and Rehabilitation

## 2019-05-05 ENCOUNTER — Encounter: Payer: Self-pay | Admitting: Physical Medicine and Rehabilitation

## 2019-05-05 ENCOUNTER — Telehealth: Payer: Self-pay | Admitting: Physical Medicine and Rehabilitation

## 2019-05-05 ENCOUNTER — Ambulatory Visit: Payer: Self-pay

## 2019-05-05 ENCOUNTER — Other Ambulatory Visit: Payer: Self-pay

## 2019-05-05 VITALS — BP 158/63 | HR 62 | Ht 60.0 in | Wt 200.0 lb

## 2019-05-05 DIAGNOSIS — M25511 Pain in right shoulder: Secondary | ICD-10-CM

## 2019-05-05 DIAGNOSIS — M797 Fibromyalgia: Secondary | ICD-10-CM

## 2019-05-05 DIAGNOSIS — M25512 Pain in left shoulder: Secondary | ICD-10-CM

## 2019-05-05 DIAGNOSIS — G894 Chronic pain syndrome: Secondary | ICD-10-CM

## 2019-05-05 DIAGNOSIS — M542 Cervicalgia: Secondary | ICD-10-CM | POA: Diagnosis not present

## 2019-05-05 DIAGNOSIS — M47816 Spondylosis without myelopathy or radiculopathy, lumbar region: Secondary | ICD-10-CM

## 2019-05-05 DIAGNOSIS — M47812 Spondylosis without myelopathy or radiculopathy, cervical region: Secondary | ICD-10-CM | POA: Diagnosis not present

## 2019-05-05 DIAGNOSIS — M48061 Spinal stenosis, lumbar region without neurogenic claudication: Secondary | ICD-10-CM

## 2019-05-05 DIAGNOSIS — G8929 Other chronic pain: Secondary | ICD-10-CM

## 2019-05-05 NOTE — Progress Notes (Signed)
 .  Numeric Pain Rating Scale and Functional Assessment Average Pain 9 Pain Right Now 0 My pain is intermittent, dull and aching Pain is worse with: walking and standing Pain improves with: rest   In the last MONTH (on 0-10 scale) has pain interfered with the following?  1. General activity like being  able to carry out your everyday physical activities such as walking, climbing stairs, carrying groceries, or moving a chair?  Rating(8)  2. Relation with others like being able to carry out your usual social activities and roles such as  activities at home, at work and in your community. Rating(8)  3. Enjoyment of life such that you have  been bothered by emotional problems such as feeling anxious, depressed or irritable?  Rating(5)

## 2019-05-05 NOTE — Telephone Encounter (Signed)
Notification or Prior Authorization is not required for the requested services  This [facility] Advantage members plan does not currently require a prior authorization for [id number], [id number], [id number], and [id number]   Decision ID #:[id number]

## 2019-05-10 ENCOUNTER — Other Ambulatory Visit: Payer: Self-pay | Admitting: Physician Assistant

## 2019-05-10 MED ORDER — ATORVASTATIN CALCIUM 40 MG PO TABS: ORAL_TABLET | ORAL | 3 refills | Status: DC

## 2019-05-10 NOTE — Addendum Note (Signed)
Addended by: Derl Barrow on: 05/10/2019 12:45 PM   Modules accepted: Orders

## 2019-05-13 ENCOUNTER — Other Ambulatory Visit: Payer: Self-pay

## 2019-05-13 ENCOUNTER — Other Ambulatory Visit: Payer: Medicare Other | Admitting: *Deleted

## 2019-05-13 DIAGNOSIS — I25119 Atherosclerotic heart disease of native coronary artery with unspecified angina pectoris: Secondary | ICD-10-CM

## 2019-05-13 LAB — HEPATIC FUNCTION PANEL
ALT: 16 IU/L (ref 0–32)
AST: 15 IU/L (ref 0–40)
Albumin: 4.1 g/dL (ref 3.6–4.6)
Alkaline Phosphatase: 49 IU/L (ref 39–117)
Bilirubin Total: 0.4 mg/dL (ref 0.0–1.2)
Bilirubin, Direct: 0.14 mg/dL (ref 0.00–0.40)
Total Protein: 6.3 g/dL (ref 6.0–8.5)

## 2019-05-13 LAB — LIPID PANEL
Chol/HDL Ratio: 3.5 ratio (ref 0.0–4.4)
Cholesterol, Total: 177 mg/dL (ref 100–199)
HDL: 51 mg/dL (ref >39)
LDL Calculated: 104 mg/dL — ABNORMAL HIGH (ref 0–99)
Triglycerides: 112 mg/dL (ref 0–149)
VLDL Cholesterol Cal: 22 mg/dL (ref 5–40)

## 2019-05-13 NOTE — Patient Outreach (Signed)
[facility]) Care Management  05/13/2019  Joanmarie ADDALYNE VANDEHEI 22-Apr-1932 [id number]  Called member at preferred number and member answered phone. Introduced self and explained reason for the call. HIPPA identifiers verified.    Falls: Member denies recent falls and stated that DME company will come to assess and fix her wheel chair on Monday 05-17-2019. Member assessed for compliance with use of ambulatory aids and member denies use. Education provided.   Nutrition: Member denies any nutritional needs and denies difficulty purchasing food.  Medications: Member was able to confirm current medications and denies any needs.  Pain: member states pain in back of right leg level 3 on scale 0-10 and states that she has an upcoming appointment to have "pain shot in my back".   Member denies any current needs at this time. Member with no hospitalizations in past year.   THN CM Care Plan Problem One     Most Recent Value  Care Plan Problem One  Risk for falls AMB history of falls  Role Documenting the Problem One  Care Management [place] for Problem One  Active  THN Long Term Goal   Member will not experience falls at home for next 45 days  THN Long Term Goal Start Date  04/14/19  Interventions for Problem One Long Term Goal  Assessed member for any current falls  THN CM Short Term Goal #1   Member will use ambulatory devices when needed for next 30 days  THN CM Short Term Goal #1 Start Date  01/13/19  Interventions for Short Term Goal #1  assessed compliance of use of ambulatory aids  THN CM Short Term Goal #2   Member will order battery for electric wheel chair by next 14 days  THN CM Short Term Goal #2 Start Date  01/13/19  [facility] CM Short Term Goal #2 Met Date  04/14/19  Interventions for Short Term Goal #2  member states DME company will come on Monday May 17, 2019    [facility] CM Care Plan Problem Two     Most Recent Value  Care Plan Problem Two  "Knowledge Deficits  related to COVID-19 and impact on patient self-Health management  Role Documenting the Problem Two  Care Management [place] for Problem Two  Active  THN Long Term Goal  Member will not be admitted to hospital for next 45 days related t COVID  THN Long Term Goal Start Date  02/08/19  [facility] Long Term Goal Met Date  04/14/19  THN CM Short Term Goal #1   "Over the next 30 days, patient will verbalize basic understanding of COVID-19 impact on individual health and self-health management as evidenced by verbalization of basic understanding of COVID-19 as a viral disease, measures to prevent exposure, signs and symptoms, when to contact provider  [facility] CM Short Term Goal #1 Start Date  02/08/19  [facility] CM Short Term Goal #1 Met Date   04/14/19     Plan: Will close case at this time and will transition member to [facility] to follow for fall risk and member verbalized agreement. Member verbalized appreciation for [place]. Will send Discipline Closure Letter to PCP.   [name], RN-BSN  [facility] Care Management  Community Care Management Coordinator  [phone] [email or web address]

## 2019-05-17 ENCOUNTER — Telehealth: Payer: Self-pay

## 2019-05-17 DIAGNOSIS — E782 Mixed hyperlipidemia: Secondary | ICD-10-CM

## 2019-05-17 DIAGNOSIS — I25119 Atherosclerotic heart disease of native coronary artery with unspecified angina pectoris: Secondary | ICD-10-CM

## 2019-05-17 NOTE — Telephone Encounter (Signed)
The patient has been notified of the result and verbalized understanding.  All questions (if any) were answered. Mady Haagensen, Joes 05/17/2019 10:49 AM

## 2019-05-17 NOTE — Telephone Encounter (Signed)
Notes recorded by Imogene Burn, PA-C on 05/14/2019 at 8:17 AM EDT  LDL has come down to 104-goal 70 and cholesterol and triglycerides are back to normal. Continue same dose lipitor and recheck flp in 4 months

## 2019-05-17 NOTE — Addendum Note (Signed)
Addended by: Mady Haagensen on: 05/17/2019 10:51 AM   Modules accepted: Orders

## 2019-05-18 ENCOUNTER — Telehealth: Payer: Self-pay | Admitting: Internal Medicine

## 2019-05-18 NOTE — Telephone Encounter (Signed)
Pt called wanting a referral to another medical doctor in the practice to prescribe her xanax. I advised her I could not switch her to another doctor in the practice as they would not prescribe her xanax either. I advised if she wanted someone to prescribe her xanax, she would have to go to another medical practice. She said ok she would do that. I do not know if she is transferring out.

## 2019-05-20 ENCOUNTER — Ambulatory Visit: Payer: Medicare Other

## 2019-05-20 ENCOUNTER — Encounter: Payer: Self-pay | Admitting: Physical Medicine and Rehabilitation

## 2019-05-20 ENCOUNTER — Ambulatory Visit (INDEPENDENT_AMBULATORY_CARE_PROVIDER_SITE_OTHER): Payer: Medicare Other | Admitting: Physical Medicine and Rehabilitation

## 2019-05-20 VITALS — BP 156/67 | HR 70

## 2019-05-20 DIAGNOSIS — M47816 Spondylosis without myelopathy or radiculopathy, lumbar region: Secondary | ICD-10-CM | POA: Diagnosis not present

## 2019-05-20 MED ORDER — METHYLPREDNISOLONE ACETATE 80 MG/ML IJ SUSP
80.0000 mg | Freq: Once | INTRAMUSCULAR | Status: AC
  Administered 2019-05-20: 80 mg

## 2019-05-20 NOTE — Progress Notes (Signed)
.  Numeric Pain Rating Scale and Functional Assessment Average Pain 9   In the last MONTH (on 0-10 scale) has pain interfered with the following?  1. General activity like being  able to carry out your everyday physical activities such as walking, climbing stairs, carrying groceries, or moving a chair?  Rating(8)   +Driver, +BT(plavix, ok for inj), -Dye Allergies.

## 2019-05-21 NOTE — Procedures (Signed)
Lumbar Facet Joint Intra-Articular Injection(s) with Fluoroscopic Guidance  Patient: Sheryl Curtis      Date of Birth: 08-23-1932 MRN: [id number] PCP: Girtha Rm, NP-C      Visit Date: 05/20/2019   Universal Protocol:    Date/Time: 05/20/2019  Consent Given By: the patient  Position: PRONE   Additional Comments: Vital signs were monitored before and after the procedure. Patient was prepped and draped in the usual sterile fashion. The correct patient, procedure, and site was verified.   Injection Procedure Details:  Procedure Site One Meds Administered:  Meds ordered this encounter  Medications  . methylPREDNISolone acetate (DEPO-MEDROL) injection 80 mg     Laterality: Bilateral  Location/Site:  L4-L5 L5-S1  Needle size: 22 guage  Needle type: Spinal  Needle Placement: Articular  Findings:  -Comments: Excellent flow of contrast producing a partial arthrogram.  Procedure Details: The fluoroscope beam is vertically oriented in AP, and the inferior recess is visualized beneath the lower pole of the inferior apophyseal process, which represents the target point for needle insertion. When direct visualization is difficult the target point is located at the medial projection of the vertebral pedicle. The region overlying each aforementioned target is locally anesthetized with a 1 to 2 ml. volume of 1% Lidocaine without Epinephrine.   The spinal needle was inserted into each of the above mentioned facet joints using biplanar fluoroscopic guidance. A 0.25 to 0.5 ml. volume of Isovue-250 was injected and a partial facet joint arthrogram was obtained. A single spot film was obtained of the resulting arthrogram.    One to 1.25 ml of the steroid/anesthetic solution was then injected into each of the facet joints noted above.   Additional Comments:  The patient tolerated the procedure well Dressing: 2 x 2 sterile gauze and Band-Aid    Post-procedure details: Patient was  observed during the procedure. Post-procedure instructions were reviewed.  Patient left the clinic in stable condition.

## 2019-05-21 NOTE — Progress Notes (Signed)
Sheryl Curtis - 85 y.o. female MRN [id number]  Date of birth: 01-Feb-1932  Office Visit Note: Visit Date: 05/20/2019 PCP: Girtha Rm, NP-C Referred by: Girtha Rm, NP-C  Subjective: Chief Complaint  Patient presents with  . Lower Back - Pain  . Right Thigh - Pain   HPI:  Sheryl Curtis is a 85 y.o. female who comes in today For planned L4-5 and L5-S1 facet joint block.  Please see our prior notes for further details and justification.  Patient's had injections in the past last year that were very beneficial.  She reports pain across the lower back worse with standing and ambulating.  Some referral into the thigh no paresthesias.  She reports 3 months of just worsening back pain without specific injury or trauma.  No red flag complaints.  We will complete these injections diagnostically today.  Just as of note she does indicate some itching and shortness of breath with heavy prednisone uses but has not had any issues with cortisone injection.  ROS Otherwise per HPI.  Assessment & Plan: Visit Diagnoses:  1. Spondylosis without myelopathy or radiculopathy, lumbar region     Plan: No additional findings.   Meds & Orders:  Meds ordered this encounter  Medications  . methylPREDNISolone acetate (DEPO-MEDROL) injection 80 mg    Orders Placed This Encounter  Procedures  . Facet Injection  . XR C-ARM NO REPORT    Follow-up: Return if symptoms worsen or fail to improve, for Consider epidural.   Procedures: No procedures performed  Lumbar Facet Joint Intra-Articular Injection(s) with Fluoroscopic Guidance  Patient: Sheryl Curtis      Date of Birth: 1932-07-05 MRN: [id number] PCP: Girtha Rm, NP-C      Visit Date: 05/20/2019   Universal Protocol:    Date/Time: 05/20/2019  Consent Given By: the patient  Position: PRONE   Additional Comments: Vital signs were monitored before and after the procedure. Patient was prepped and draped in the usual sterile fashion.  The correct patient, procedure, and site was verified.   Injection Procedure Details:  Procedure Site One Meds Administered:  Meds ordered this encounter  Medications  . methylPREDNISolone acetate (DEPO-MEDROL) injection 80 mg     Laterality: Bilateral  Location/Site:  L4-L5 L5-S1  Needle size: 22 guage  Needle type: Spinal  Needle Placement: Articular  Findings:  -Comments: Excellent flow of contrast producing a partial arthrogram.  Procedure Details: The fluoroscope beam is vertically oriented in AP, and the inferior recess is visualized beneath the lower pole of the inferior apophyseal process, which represents the target point for needle insertion. When direct visualization is difficult the target point is located at the medial projection of the vertebral pedicle. The region overlying each aforementioned target is locally anesthetized with a 1 to 2 ml. volume of 1% Lidocaine without Epinephrine.   The spinal needle was inserted into each of the above mentioned facet joints using biplanar fluoroscopic guidance. A 0.25 to 0.5 ml. volume of Isovue-250 was injected and a partial facet joint arthrogram was obtained. A single spot film was obtained of the resulting arthrogram.    One to 1.25 ml of the steroid/anesthetic solution was then injected into each of the facet joints noted above.   Additional Comments:  The patient tolerated the procedure well Dressing: 2 x 2 sterile gauze and Band-Aid    Post-procedure details: Patient was observed during the procedure. Post-procedure instructions were reviewed.  Patient left the clinic in stable condition.  Clinical History: MRI LUMBAR SPINE WITHOUT CONTRAST  TECHNIQUE: Multiplanar, multisequence MR imaging of the lumbar spine was performed. No intravenous contrast was administered.  COMPARISON:  MRI lumbar spine July 01, 2015  FINDINGS: SEGMENTATION: For the purposes of this report, the last well-formed  intervertebral disc will be described as L5-S1.  ALIGNMENT: Maintenance of the lumbar lordosis. Minimal grade 1 L5-S1 retrolisthesis without spondylolysis.  VERTEBRAE:Vertebral bodies are intact. Similar severe L3-4, L4-5 and L5-S1 disc height loss, associated with levoscoliosis. Severe T11-12 and T12-L1 disc height loss associated with dextroscoliosis. Decreased T2 signal within all disc compatible with desiccation with multilevel vacuum disc. Moderate lower lumbar subacute on chronic discogenic endplate changes. No bone marrow signal abnormality to suggest acute osseous process.  CONUS MEDULLARIS: Conus medullaris terminates at T12-L1 and demonstrates normal morphology and signal characteristics. Similar 4 mm low signal nodule RIGHT thecal sac at L4 best seen on sagittal T2 10/17. Cauda equina is normal.  PARASPINAL AND SOFT TISSUES: Included prevertebral and paraspinal soft tissues are nonacute. Moderate to severe symmetric paraspinal muscle atrophy. Stable renal cysts measuring up to 15 mm on the RIGHT.  DISC LEVELS:  L1-2: Stable small broad-based disc bulge asymmetric to LEFT. Mild facet arthropathy and ligamentum flavum redundancy without canal stenosis. Minimal LEFT neural foraminal narrowing.  L2-3: Stable small broad-based disc bulge. Mild facet arthropathy and ligamentum flavum redundancy without canal stenosis. Mild to moderate LEFT neural foraminal narrowing.  L3-4: Small broad-based disc bulge, moderate RIGHT subarticular disc protrusion. Mild facet arthropathy and ligamentum flavum redundancy. No canal stenosis though partial chronic effacement RIGHT lateral recess could affect the traversing RIGHT L4 nerve. Moderate to severe RIGHT, mild LEFT neural foraminal narrowing.  L4-5: Stable moderate broad-based disc bulge asymmetric to the RIGHT. Moderate RIGHT and mild LEFT facet arthropathy with ligamentum flavum redundancy. No canal stenosis. Severe  RIGHT, moderate LEFT neural foraminal narrowing.  L5-S1: Moderate broad-based disc bulge. Mild to moderate facet arthropathy and ligamentum flavum redundancy without canal stenosis. Moderate to severe RIGHT, severe LEFT neural foraminal narrowing.  IMPRESSION: Stable degenerative change of the lumbar spine without fracture or malalignment.  No canal stenosis. Neural foraminal narrowing at all lumbar levels: Severe on the RIGHT at L4-5 and severe on the LEFT at L5-S1.  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   Electronically Signed   By: Elon Alas M.D.   On: 08/22/2016 03:14     Objective:  VS:  HT:    WT:   BMI:     BP:(!) 156/67  HR:70bpm  TEMP: ( )  RESP:  Physical Exam Musculoskeletal:     Comments: Patient ambulates with a walker.  She has no pain over the greater trochanters and good distal strength.  Neurological:     Mental Status: She is alert and oriented to person, place, and time.     Motor: No abnormal muscle tone.     Coordination: Coordination normal.     Ortho Exam Imaging: Xr C-arm No Report  Result Date: 05/20/2019 Please see Notes tab for imaging impression.

## 2019-05-31 ENCOUNTER — Telehealth: Payer: Self-pay | Admitting: Physical Medicine and Rehabilitation

## 2019-06-01 NOTE — Telephone Encounter (Signed)
L4 transforaminal epidural steroid injection

## 2019-06-02 NOTE — Telephone Encounter (Signed)
Called patient to schedule. She was also scheduled for cervical facet injections tomorrow. Her daughter states that she has been sick and needs to cancel her appointment for tomorrow and that she will call us back to schedule both.

## 2019-06-03 ENCOUNTER — Telehealth: Payer: Self-pay | Admitting: Pulmonary Disease

## 2019-06-03 ENCOUNTER — Encounter: Payer: Medicare Other | Admitting: Physical Medicine and Rehabilitation

## 2019-06-03 NOTE — Telephone Encounter (Signed)
I have the form and it is to repair her wheel chair. I called Danielle and explained that I don't see where we have order any wheelchair and that it may have been her PCP. I gave them Vickie Henson's name and phone number to call their office. The form came after Dr. Elsworth Soho had left and I couldn't ask him about signing the form

## 2019-06-04 NOTE — Telephone Encounter (Signed)
Rodena Piety, please advise if this has been able to be taken care of. Thanks!

## 2019-06-04 NOTE — Telephone Encounter (Signed)
I can't exactly say that it has been taken care. Dr. Elsworth Soho left yesterday before we got the form. I just called Danielle and told her to check with the Patients PCP

## 2019-06-07 NOTE — Telephone Encounter (Signed)
Attempted to call Danielle with [employer] but unable to reach.left message for her to return call.

## 2019-06-08 ENCOUNTER — Telehealth: Payer: Self-pay

## 2019-06-08 NOTE — Telephone Encounter (Addendum)
lmtcb for Danielle with [employer]. Rodena Piety do you have any additional information with this? Thanks.

## 2019-06-08 NOTE — Telephone Encounter (Signed)
Got a form from [employer]. They want to provide a new battery and repair to pt chair. Please advise if a stamp is okayed to provide. Thanks [employer]

## 2019-06-08 NOTE — Telephone Encounter (Signed)
I didn't keep the forms after I ask Danielle with [employer] to ask the PCP to sign the form. I haven't heard or seen anything else for the patient

## 2019-06-08 NOTE — Telephone Encounter (Signed)
This is ok. Thank you.

## 2019-06-09 ENCOUNTER — Telehealth: Payer: Self-pay | Admitting: Interventional Cardiology

## 2019-06-09 NOTE — Telephone Encounter (Signed)
Follow up:    Patient daughter returning your call back. You may also call patient.

## 2019-06-09 NOTE — Telephone Encounter (Signed)
New Message    Pt c/o medication issue:  1. Name of Medication: Isosorbide mononitrate  2. How are you currently taking this medication (dosage and times per day)? 60mg  1 tablet by mouth daily  3. Are you having a reaction (difficulty breathing--STAT)? NO  4. What is your medication issue? Patient has been feeling week and drained since increasing the medication and has since went back to taking 30mg .  Please call daughter to discuss.

## 2019-06-09 NOTE — Telephone Encounter (Signed)
Left message for patient to call back  

## 2019-06-09 NOTE — Telephone Encounter (Signed)
Called [employer] and spoke with Danielle. Asked Andee Poles if she had been able to contact PCP in regards to the forms for the wheelchair for pt and she said that she was able to contact PCP and has received forms back from them. Nothing further needed.

## 2019-06-09 NOTE — Telephone Encounter (Signed)
Left on Vickie desk to sign. [place]

## 2019-06-10 ENCOUNTER — Other Ambulatory Visit: Payer: Self-pay

## 2019-06-10 MED ORDER — ISOSORBIDE MONONITRATE ER 30 MG PO TB24: 30.0000 mg | ORAL_TABLET | Freq: Every day | ORAL | 3 refills | Status: DC

## 2019-06-10 MED ORDER — ISOSORBIDE MONONITRATE ER 60 MG PO TB24: 60.0000 mg | ORAL_TABLET | Freq: Every day | ORAL | Status: DC

## 2019-06-10 NOTE — Telephone Encounter (Signed)
Pt calling stating that her pharmacy is still sending her medication of Isosorbide 60 mg tablet and spoke with [place], [place], Dr. Hassell Done nurse, concerning this matter. Pt decreased her isosorbide 60 mg tablets, to isosorbide 30 mg tablets daily, but isosorbide 60 mg tablet is still on pt med list and pt would someone to call her pharmacy to tell them that she no longer takes the 60 mg tablet. Would Dr. Irish Lack like to reduce the isosorbide to 30 mg tablets, like pt is taking. Please address

## 2019-06-10 NOTE — Telephone Encounter (Signed)
Called and spoke to patient. She states that she has felt weak and drained over the past several weeks. She thinks that it is related to being on the higher dose of imdur, so she decreased it back down to 30 mg QD. She denies having any chest pain, increased SOB, swelling, or any other Sx. She states that she saw Dr. Alroy Dust yesterday and her BP was good but did not know the reading. Offered patient an appointment as she no showed her last appointment. Patient declines virtual or in office appointment and states that she does not feel like she needs to be seen at this time. Instructed for the patient to call if her Sx change or worsen. Patient verbalized understanding and thanked me for the call. Daughter made aware.

## 2019-06-10 NOTE — Telephone Encounter (Signed)
See other phone note from 8/12 °

## 2019-06-10 NOTE — Telephone Encounter (Addendum)
Pt would like a new RX sent to [facility] for her Imdur 30mg  since she had decreased it on her own several days ago and she has felt much better.. she was feeling weak, headache, and dizzy.. but she has had her symptoms much improved.. she does not know her BP but was seen by her PCP recently Harland Dingwall NP and she says it was "good" then. She has had a 60 mg bottle sent to her and [facility] says they will take it back if we can send in the new RX for the 30 mg.. she does not want to have to cut them in half.. will have to send request to Dr. Irish Lack for review. Pt denies chest pain and sob.

## 2019-06-14 ENCOUNTER — Ambulatory Visit: Payer: Medicare Other | Admitting: Pulmonary Disease

## 2019-06-14 ENCOUNTER — Other Ambulatory Visit: Payer: Self-pay

## 2019-06-14 ENCOUNTER — Ambulatory Visit (INDEPENDENT_AMBULATORY_CARE_PROVIDER_SITE_OTHER): Payer: Medicare Other

## 2019-06-14 ENCOUNTER — Encounter: Payer: Self-pay | Admitting: Pulmonary Disease

## 2019-06-14 VITALS — BP 124/72 | HR 75 | Temp 98.6°F | Ht 64.0 in | Wt 211.8 lb

## 2019-06-14 DIAGNOSIS — K219 Gastro-esophageal reflux disease without esophagitis: Secondary | ICD-10-CM

## 2019-06-14 DIAGNOSIS — J309 Allergic rhinitis, unspecified: Secondary | ICD-10-CM | POA: Diagnosis not present

## 2019-06-14 DIAGNOSIS — R059 Cough, unspecified: Secondary | ICD-10-CM

## 2019-06-14 DIAGNOSIS — J4530 Mild persistent asthma, uncomplicated: Secondary | ICD-10-CM | POA: Diagnosis not present

## 2019-06-14 DIAGNOSIS — R5381 Other malaise: Secondary | ICD-10-CM

## 2019-06-14 DIAGNOSIS — G4733 Obstructive sleep apnea (adult) (pediatric): Secondary | ICD-10-CM | POA: Diagnosis not present

## 2019-06-14 DIAGNOSIS — R05 Cough: Secondary | ICD-10-CM | POA: Diagnosis not present

## 2019-06-14 DIAGNOSIS — R1310 Dysphagia, unspecified: Secondary | ICD-10-CM

## 2019-06-14 MED ORDER — BUDESONIDE-FORMOTEROL FUMARATE 160-4.5 MCG/ACT IN AERO: 2.0000 | INHALATION_SPRAY | Freq: Two times a day (BID) | RESPIRATORY_TRACT | 0 refills | Status: DC

## 2019-06-14 NOTE — Assessment & Plan Note (Signed)
Plan: Referral to gastroenterology

## 2019-06-14 NOTE — Assessment & Plan Note (Signed)
Plan: X-ray today Continue Symbicort 160, sample provided today Continue albuterol nebulized meds Increase daily physical activity Continue daily Zyrtec Continue Atrovent nasal spray

## 2019-06-14 NOTE — Progress Notes (Signed)
Chest x-ray today stable.  No active cardiopulmonary disease.  No changes to plan of care at this time.  Resume flutter valve, resume nasal saline rinses.  Contact our office if symptoms worsen.  Wyn Quaker, FNP

## 2019-06-14 NOTE — Assessment & Plan Note (Signed)
Plan: X-ray today Continue Symbicort 160 Continue albuterol nebulized meds Continue Zyrtec daily Continue Atrovent nasal spray Resume flutter valve use Resume nasal saline rinses as outlined by ENT Evaluation by gastroenterology, referral placed Continue acid reflux medications that she purchased over-the-counter

## 2019-06-14 NOTE — Assessment & Plan Note (Signed)
Plan: Continue over-the-counter PPI Referral to gastroenterology

## 2019-06-14 NOTE — Progress Notes (Signed)
@Patient  ID: Sheryl Curtis, female    DOB: 1932/09/06, 85 y.o.   MRN: [id number]  Chief Complaint  Patient presents with  . Follow-up    Patient reports sob and persistant cough with clear sputum. She reports using her rescue in haler daily.     Referring provider: Girtha Rm, NP-C  HPI:  85 year old female former smoker followed in our office for asthma, obstructive sleep apnea, nocturnal hypoxia, allergic rhinitis  PMH: Allergic rhinitis, A. fib, CAD, hypertension Smoker/ Smoking History: Former smoker.  Quit 1964.  12-pack-year smoking history. Maintenance: Symbicort 160 Pt of: Dr. Elsworth Soho  06/14/2019  - Visit   85 year old female former smoker followed in our office for chronic cough, asthma, and dyspnea on exertion.  Patient is maintained on Symbicort 160.  Patient struggles with chronic maintenance and plan of care adherence.  Patient has been evaluated by ENT at [facility] and was recommended to do nasal saline rinses.  This has not been occurring.  Patient was reports adherence to Symbicort 160.  She is been using her albuterol nebulized meds twice daily.  Patient recently completed February/2020 pulmonary function testing which was normal.  Patient also previously diagnosed with mild obstructive sleep apnea.  Patient was intolerant of CPAP.  CPAP was stopped in February/2020.  Patient reports that her cough continues to worsen.  She has been making more mucus lately.  Mucus color is clear to white.  Patient feels that she coughs all the time.  Patient also reporting she is having some difficulty swallowing.  She does not have a current gastroenterologist that she is established with.  Patient believes she is taking medication for stomach acid in the morning but she cannot remember which one it is.  It sounds as if patient may be on an over-the-counter PPI such as omeprazole.  Patient is also maintained on Zyrtec.  She is also using Atrovent nasal sprays.  Patient continues to  be sedentary with her lifestyle management.  She attributes this to she is been having some chronic medication issues with her cardiology medications.  She reports that her medication was accidentally doubled and she started having dizziness from this.  This is led her to be even more sedentary than usual.  Patient reports that her dizziness has improved.  She is been working with Dr. Maurine Simmering office on this.  Vital signs today are stable.   Tests:   CTchest 03/2017 -stable multiple pulmonary nodules since 2015  Spirometry in 05/2010 showed FEV1 of 76% improvement postbronchodilator to 80%-1.60 with a ratio of 75 suggesting mild restriction. Spirometry 03/2014 FEV1 of 1.25-64%, with ratio 65 and FVC of 1.91-72%.  PFTs - 12/2014 - no airway obstruction, ratio 71, pos BD response PFTs 11/2018, and mild airway obstruction with FEV1 79%, TLC 101%, DLCO normal, no bronchodilator response  06/2015 Admit for UTI and Sepsis   NPSG >> AHI 10/h corrected by CPAP 10 cm  Echo 06/2018>normal LV function without any evidence of diastolic dysfunction.   SIX MIN WALK 10/29/2018 03/03/2017 12/01/2014 04/11/2014  Supplimental Oxygen during Test? (L/min) No No No No  Tech Comments: pt walked at moderate pace with c/o sob. pt unable to complete lap 2 or 3.  pt walked 1 lap at a slow pace with cane, did not walk laps 2 and 3 d/t knee pain, sob, and chest tightness.  test stopped d/t hip discomfort -     FENO:  No results found for: NITRICOXIDE  PFT: PFT Results Latest Ref  Rng & Units 12/09/2018 03/17/2017 01/10/2015  FVC-Pre L 1.76 1.98 1.97  FVC-Predicted Pre % 82 91 86  FVC-Post L 1.87 2.00 2.31  FVC-Predicted Post % 87 91 101  Pre FEV1/FVC % % 70 70 71  Post FEV1/FCV % % 72 76 71  FEV1-Pre L 1.24 1.40 1.41  FEV1-Predicted Pre % 79 87 84  FEV1-Post L 1.36 1.52 1.64  DLCO UNC% % 100 98 94  DLCO COR %Predicted % 108 118 100  TLC L 4.75 4.73 4.87  TLC % Predicted % 101 101 104  RV % Predicted % 122  117 116    Imaging: Dg Chest 2 View  Result Date: 06/14/2019 CLINICAL DATA:  Asthma exacerbation. EXAM: CHEST - 2 VIEW COMPARISON:  10/06/2018 FINDINGS: Heart size is stable. Aortic calcifications are noted. There is no pneumothorax. No large pleural effusion. No focal infiltrate. There is no acute osseous abnormality. There are degenerative changes throughout the visualized thoracolumbar spine. IMPRESSION: No active cardiopulmonary disease. Electronically Signed   By: Constance Holster M.D.   On: 06/14/2019 14:53   Xr C-arm No Report  Result Date: 05/20/2019 Please see Notes tab for imaging impression.     Specialty Problems      Pulmonary Problems   Allergic rhinitis    Qualifier: Diagnosis of  By: Lamonte Sakai MD, Rose Fillers       DYSPNEA           Pulmonary nodules/lesions, multiple    Noted 03/2014 on CT abd -stable 12/2014, stable4/2016 except for new 39mm RLL >repeat 1 yr ~01/2017       CAFL (chronic airflow limitation) (HCC)   Asthma   Nocturnal hypoxia    Per O&O 02/2017      OSA (obstructive sleep apnea)    Mild AHI 10/h CPAP 10 cm  Patient stopped CPAP therapy in February/2020      Cough   Mild persistent asthma with (acute) exacerbation      Allergies  Allergen Reactions  . Aspirin Anaphylaxis, Hives and Shortness Of Breath  . Hydroxyquinolines Hives  . Imipramine Hcl Other (See Comments)    UNKNOWN TO PATIENT  . Pamelor [Nortriptyline Hcl] Other (See Comments)    unknown  . Prednisone Shortness Of Breath and Itching    anxiety  . Procardia [Nifedipine] Other (See Comments)    Unknown   . Tramadol Swelling  . Ace Inhibitors Other (See Comments)    cough  . Influenza Vaccines   . Plaquenil [Hydroxychloroquine Sulfate] Rash    Immunization History  Administered Date(s) Administered  . Pneumococcal Conjugate-13 11/23/2013  . Pneumococcal Polysaccharide-23 08/27/2007, 04/21/2019  . Tdap 08/04/2008  . Zoster Recombinat (Shingrix) 02/19/2017,  09/24/2017    Past Medical History:  Diagnosis Date  . ALLERGIC RHINITIS   . Atrial fibrillation ([place])   . CANCER, COLORECTAL   . CHEST PAIN, ATYPICAL   . CKD (chronic kidney disease)   . DYSPNEA   . Essential hypertension, benign   . FIBROMYALGIA   . G E R D   . History of stress test    Myoview 8/16:  EF 69%, anterior and apical defect c/w breast attenuation; Low Risk   . INSOMNIA   . Morbid obesity ([place])   . Other and unspecified angina pectoris     Tobacco History: Social History   Tobacco Use  Smoking Status Former Smoker  . Packs/day: 1.00  . Years: 12.00  . Pack years: 12.00  . Types: Cigarettes  . Start date:  10/28/1949  . Quit date: 10/28/1962  . Years since quitting: 56.6  Smokeless Tobacco Never Used   Counseling given: Yes  Continue to not smoke  Outpatient Encounter Medications as of 06/14/2019  Medication Sig  . albuterol (PROVENTIL) (2.5 MG/3ML) 0.083% nebulizer solution USE 1 VIAL VIA NEBULIZER EVERY 6 HOURS AS NEEDED  . Albuterol Sulfate (PROAIR RESPICLICK) 166 (90 Base) MCG/ACT AEPB Inhale 1-2 puffs into the lungs every 6 (six) hours as needed (for shortness of breath and/or wheezing.).  Marland Kitchen Ascorbic Acid (VITAMIN C) 1000 MG tablet Take 1,000 mg by mouth daily.  Marland Kitchen atorvastatin (LIPITOR) 40 MG tablet TAKE 1 TABLET(40 MG) BY MOUTH DAILY  . budesonide-formoterol (SYMBICORT) 160-4.5 MCG/ACT inhaler Inhale 2 puffs into the lungs 2 (two) times daily.  Marland Kitchen CALCIUM PO Take 1,200 mg by mouth daily.   . cetirizine (ZYRTEC) 10 MG tablet Take 10 mg by mouth daily.  . clopidogrel (PLAVIX) 75 MG tablet TAKE 1 TABLET(75 MG) BY MOUTH EVERY OTHER DAY  . DULoxetine (CYMBALTA) 60 MG capsule Take 60 mg by mouth daily.  . furosemide (LASIX) 40 MG tablet Take 1 tablet (40 mg total) by mouth daily.  Marland Kitchen ipratropium (ATROVENT) 0.03 % nasal spray Place 2 sprays into both nostrils every 12 (twelve) hours.  . isosorbide mononitrate (IMDUR) 60 MG 24 hr tablet Take 1 tablet (60 mg  total) by mouth daily. (Patient taking differently: Take 30 mg by mouth daily. )  . meclizine (ANTIVERT) 25 MG tablet Take 1 tablet (25 mg total) by mouth 3 (three) times daily as needed for dizziness.  . Nebulizers (COMPRESSOR NEBULIZER) MISC 1 Device by Does not apply route once.  . nitroGLYCERIN (NITROSTAT) 0.4 MG SL tablet Place 1 tablet (0.4 mg total) under the tongue every 5 (five) minutes as needed for chest pain.  . Omega-3 Fatty Acids (FISH OIL) 1000 MG CAPS Take by mouth.  . potassium chloride SA (K-DUR,KLOR-CON) 20 MEQ tablet TAKE 1 TABLET BY MOUTH  DAILY  . Respiratory Therapy Supplies (FLUTTER) DEVI Use flutter device 3 times a day  . valsartan-hydrochlorothiazide (DIOVAN-HCT) 160-12.5 MG tablet Take 1 tablet by mouth daily.  . budesonide-formoterol (SYMBICORT) 160-4.5 MCG/ACT inhaler Inhale 2 puffs into the lungs 2 (two) times daily.   No facility-administered encounter medications on file as of 06/14/2019.      Review of Systems  Review of Systems  Constitutional: Positive for fatigue. Negative for activity change and fever.  HENT: Positive for congestion, sinus pressure (used otc remedies 1 month ago, improving now) and sinus pain (treated with otc sinus d - 1 month ago - feels better, improving ). Negative for sore throat.   Respiratory: Positive for cough and shortness of breath. Negative for wheezing.   Cardiovascular: Negative for chest pain and palpitations.  Gastrointestinal: Negative for diarrhea, nausea and vomiting.  Musculoskeletal: Negative for arthralgias.  Neurological: Positive for dizziness (attributes this to cardiology meds being changed, this is improving).  Psychiatric/Behavioral: Negative for sleep disturbance. The patient is not nervous/anxious.      Physical Exam  BP 124/72 (BP Location: Left Arm, Cuff Size: Normal)   Pulse 75   Temp 98.6 F (37 C) (Oral)   Ht 5\' 4"  (1.626 m)   Wt 211 lb 12.8 oz (96.1 kg)   SpO2 96%   BMI 36.36 kg/m   Wt  Readings from Last 5 Encounters:  06/14/19 211 lb 12.8 oz (96.1 kg)  05/05/19 200 lb (90.7 kg)  04/21/19 209 lb 12.8 oz (95.2 kg)  03/10/19 200 lb (90.7 kg)  01/13/19 104 lb (47.2 kg)     Physical Exam Vitals signs and nursing note reviewed.  Constitutional:      Appearance: Normal appearance. She is obese.  HENT:     Head: Normocephalic and atraumatic.     Right Ear: Tympanic membrane and ear canal normal. There is no impacted cerumen.     Left Ear: Tympanic membrane and ear canal normal. There is no impacted cerumen.     Ears:     Comments: Right hear aid     Nose: Nose normal. No congestion.     Mouth/Throat:     Mouth: Mucous membranes are moist.     Pharynx: Oropharynx is clear.  Eyes:     Pupils: Pupils are equal, round, and reactive to light.  Neck:     Musculoskeletal: Normal range of motion.  Cardiovascular:     Rate and Rhythm: Normal rate and regular rhythm.     Pulses: Normal pulses.     Heart sounds: Normal heart sounds. No murmur.  Pulmonary:     Effort: Pulmonary effort is normal. No respiratory distress.     Breath sounds: No decreased air movement. Wheezing (Upper Airway ) present. No decreased breath sounds or rales.  Abdominal:     General: Bowel sounds are normal.     Palpations: Abdomen is soft.     Comments: obese  Skin:    General: Skin is warm and dry.     Capillary Refill: Capillary refill takes less than 2 seconds.  Neurological:     General: No focal deficit present.     Mental Status: She is alert and oriented to person, place, and time. Mental status is at baseline.     Gait: Gait normal.  Psychiatric:        Mood and Affect: Mood normal.        Behavior: Behavior normal.        Thought Content: Thought content normal.        Judgment: Judgment normal.      Lab Results:  CBC    Component Value Date/Time   WBC 8.5 10/29/2018 1245   RBC 3.83 (L) 10/29/2018 1245   HGB 12.5 10/29/2018 1245   HCT 36.8 10/29/2018 1245   PLT 234.0  10/29/2018 1245   MCV 96.1 10/29/2018 1245   MCH 31.6 09/15/2016 1601   MCHC 33.9 10/29/2018 1245   RDW 14.7 10/29/2018 1245   LYMPHSABS 1.4 08/03/2018 1545   MONOABS 1.4 (H) 08/03/2018 1545   EOSABS 0.1 08/03/2018 1545   BASOSABS 0.1 08/03/2018 1545    BMET    Component Value Date/Time   NA 139 01/01/2019 1615   K 4.4 01/01/2019 1615   CL 96 01/01/2019 1615   CO2 23 01/01/2019 1615   GLUCOSE 100 (H) 01/01/2019 1615   GLUCOSE 104 (H) 10/29/2018 1245   BUN 29 (H) 01/01/2019 1615   CREATININE 1.52 (H) 01/01/2019 1615   CALCIUM 9.9 01/01/2019 1615   GFRNONAA 31 (L) 01/01/2019 1615   GFRAA 36 (L) 01/01/2019 1615    BNP    Component Value Date/Time   BNP 82.0 07/01/2015 1653    ProBNP    Component Value Date/Time   PROBNP 58.0 10/29/2018 1245      Assessment & Plan:   Allergic rhinitis Plan: Continue Zyrtec Continue Atrovent nasal spray Nasal saline rinses at least twice a day Avoid known triggers Continue recommendations by ENT   Asthma Plan: X-ray  today Continue Symbicort 160, sample provided today Continue albuterol nebulized meds Increase daily physical activity Continue daily Zyrtec Continue Atrovent nasal spray     OSA (obstructive sleep apnea) Plan: We will continue to monitor clinically  Physical deconditioning Plan: Work on increasing daily physical activity Chest x-ray today Start exercising 5 minutes in the morning and 5 minutes in the evening, do this daily Slowly work to increase minutes accordingly as you are able to tolerate  Morbid obesity ([place]) Plan: Continue to work on daily physical activity to reduce BMI  Cough Plan: X-ray today Continue Symbicort 160 Continue albuterol nebulized meds Continue Zyrtec daily Continue Atrovent nasal spray Resume flutter valve use Resume nasal saline rinses as outlined by ENT Evaluation by gastroenterology, referral placed Continue acid reflux medications that she purchased  over-the-counter  Dysphagia Plan: Referral to gastroenterology  G E R D Plan: Continue over-the-counter PPI Referral to gastroenterology    Return in about 3 months (around 09/14/2019), or if symptoms worsen or fail to improve, for Follow up with Dr. Elsworth Soho.   Lauraine Rinne, NP 06/14/2019   This appointment was 42 minutes long with over 50% of the time in direct face-to-face patient care, assessment, plan of care, and follow-up.

## 2019-06-14 NOTE — Assessment & Plan Note (Signed)
Plan: Continue Zyrtec Continue Atrovent nasal spray Nasal saline rinses at least twice a day Avoid known triggers Continue recommendations by ENT

## 2019-06-14 NOTE — Assessment & Plan Note (Signed)
Plan: Work on increasing daily physical activity Chest x-ray today Start exercising 5 minutes in the morning and 5 minutes in the evening, do this daily Slowly work to increase minutes accordingly as you are able to tolerate

## 2019-06-14 NOTE — Assessment & Plan Note (Signed)
Plan: Continue to work on daily physical activity to reduce BMI

## 2019-06-14 NOTE — Assessment & Plan Note (Signed)
Plan: We will continue to monitor clinically

## 2019-06-14 NOTE — Patient Instructions (Addendum)
You were seen today by Lauraine Rinne, NP  for:   1. Cough - Ambulatory referral to Gastroenterology - DG Chest 2 View; Future  Talk to primary care about stopping her fish oil vitamin  Resume flutter valve use twice daily, 10 breaths each time Resume nasal saline rinses twice daily as outlined by ear nose and throat  2. Mild persistent asthma without complication  - DG Chest 2 View; Future  Continue Symbicort 160 >>> 2 puffs in the morning right when you wake up, rinse out your mouth after use, 12 hours later 2 puffs, rinse after use >>> Take this daily, no matter what >>> This is not a rescue inhaler   Continue daily allergy pill Continue daily nasal saline rinses Continue Atrovent nasal spray  3. Allergic rhinitis, unspecified seasonality, unspecified trigger  Continue daily Zyrtec Continue nasal spray Resume nasal saline rinses twice daily   4. OSA (obstructive sleep apnea)  We will continue to monitor you clinically for this  5. Physical deconditioning  Start increasing daily physical activity, do this consistently Start with doing 5 minutes of physical exercise in the morning and 5 minutes of physical exercise in the evening, slowly work to increase daily activity  6. Gastroesophageal reflux disease without esophagitis  Continue over-the-counter stomach acid pill that you are taking, contact our office and let us know what the name of it is as well as the dosage  - Ambulatory referral to Gastroenterology  7. Dysphagia, unspecified type  - Ambulatory referral to Gastroenterology   We recommend today:  Orders Placed This Encounter  Procedures  . DG Chest 2 View    Standing Status:   Future    Standing Expiration Date:   08/13/2020    Order Specific Question:   Reason for Exam (SYMPTOM  OR DIAGNOSIS REQUIRED)    Answer:   cough    Order Specific Question:   Preferred imaging location?    Answer:   Internal    Order Specific Question:   Radiology Contrast  Protocol - do NOT remove file path    Answer:   [email or web address]  . Ambulatory referral to Gastroenterology    Referral Priority:   Routine    Referral Type:   Consultation    Referral Reason:   Specialty Services Required    Number of Visits Requested:   1   Orders Placed This Encounter  Procedures  . DG Chest 2 View  . Ambulatory referral to Gastroenterology   No orders of the defined types were placed in this encounter.   Follow Up:    Return in about 3 months (around 09/14/2019), or if symptoms worsen or fail to improve, for Follow up with Dr. Elsworth Soho.   Please do your part to reduce the spread of COVID-19:      Reduce your risk of any infection  and COVID19 by using the similar precautions used for avoiding the common cold or flu:  Marland Kitchen Wash your hands often with soap and warm water for at least 20 seconds.  If soap and water are not readily available, use an alcohol-based hand sanitizer with at least 60% alcohol.  . If coughing or sneezing, cover your mouth and nose by coughing or sneezing into the elbow areas of your shirt or coat, into a tissue or into your sleeve (not your hands). Langley Gauss A MASK when in public  . Avoid shaking hands with others and consider head nods or verbal greetings only. . Avoid touching your  eyes, nose, or mouth with unwashed hands.  . Avoid close contact with people who are sick. . Avoid places or events with large numbers of people in one location, like concerts or sporting events. . If you have some symptoms but not all symptoms, continue to monitor at home and seek medical attention if your symptoms worsen. . If you are having a medical emergency, call 911.   [place] / e-Visit: [email or web address]         [facility]: [facility]: [phone]                   [facility]: [phone]     It is flu season:   >>> Best ways to protect herself from the flu: Receive the yearly flu vaccine, practice good hand hygiene washing with soap and also using hand sanitizer when available, eat a nutritious meals, get adequate rest, hydrate appropriately   Please contact the office if your symptoms worsen or you have concerns that you are not improving.   Thank you for choosing [facility] for your healthcare, and for allowing [place] to partner with you on your healthcare journey. I am thankful to be able to provide care to you today.   Wyn Quaker FNP-C

## 2019-06-15 ENCOUNTER — Telehealth: Payer: Self-pay | Admitting: Pulmonary Disease

## 2019-06-15 NOTE — Telephone Encounter (Signed)
Pt returning call for results of cxr which were given and documented in results.  Pt also states she was to call back with name of medication she is taking for GERD. Nexium 20 mg 1 capsule daily. This has been added to medication list as well.

## 2019-06-15 NOTE — Telephone Encounter (Signed)
Thank you for updating the patient.  Thank you for addending the patient's medications.  Proceed forward with gastroenterology referral as outlined in yesterday's visit.  Wyn Quaker, FNP

## 2019-06-15 NOTE — Telephone Encounter (Signed)
Noted. Referral was processed. Pt aware to contact us back if she has not heard back from GI regarding an appt. Pt verbalized understanding. Nothing further needed at this time.

## 2019-06-24 ENCOUNTER — Telehealth: Payer: Self-pay | Admitting: Pulmonary Disease

## 2019-06-24 ENCOUNTER — Encounter: Payer: Self-pay | Admitting: Physician Assistant

## 2019-06-24 NOTE — Telephone Encounter (Signed)
Noted. Will keep message open to make sure there is a GI appt made soon.

## 2019-06-24 NOTE — Telephone Encounter (Signed)
Attempted to return call to patient regarding GI referral as update noted.  Patient did not answer but left voicemail that is we can see an appointment was made today for 07/07/19 with GI and if she needs anything further to call us.  Will close encounter.

## 2019-06-24 NOTE — Telephone Encounter (Signed)
I just spoke with [facility].  They aren't sure why the patient wasn't already called & will call her today.

## 2019-06-24 NOTE — Telephone Encounter (Signed)
Returned call to patient who states she 'had a real bad choking spell this morning' and has not had any messages or calls from the GI office.  Informed patient we will have our [facility] staff follow up on referral and get back with her.  Referral was placed and no appointment is seen in appointment tab.    Routed to [facility] pool for follow up: Can we provide any follow up on this for our patient?

## 2019-06-29 ENCOUNTER — Ambulatory Visit: Payer: Self-pay

## 2019-06-29 ENCOUNTER — Ambulatory Visit (INDEPENDENT_AMBULATORY_CARE_PROVIDER_SITE_OTHER): Payer: Medicare Other | Admitting: Physical Medicine and Rehabilitation

## 2019-06-29 ENCOUNTER — Encounter: Payer: Self-pay | Admitting: Physical Medicine and Rehabilitation

## 2019-06-29 VITALS — BP 172/59 | HR 83

## 2019-06-29 DIAGNOSIS — M47816 Spondylosis without myelopathy or radiculopathy, lumbar region: Secondary | ICD-10-CM | POA: Diagnosis not present

## 2019-06-29 NOTE — Progress Notes (Signed)
 .  Numeric Pain Rating Scale and Functional Assessment Average Pain 9   In the last MONTH (on 0-10 scale) has pain interfered with the following?  1. General activity like being  able to carry out your everyday physical activities such as walking, climbing stairs, carrying groceries, or moving a chair?  Rating(9)   +Driver, +BT(plavix, ok for inj), -Dye Allergies.

## 2019-07-02 ENCOUNTER — Encounter: Payer: Self-pay | Admitting: *Deleted

## 2019-07-07 ENCOUNTER — Encounter: Payer: Self-pay | Admitting: Physician Assistant

## 2019-07-07 ENCOUNTER — Ambulatory Visit (INDEPENDENT_AMBULATORY_CARE_PROVIDER_SITE_OTHER): Payer: Medicare Other | Admitting: Physician Assistant

## 2019-07-07 VITALS — BP 170/60 | HR 69 | Temp 97.9°F | Ht 64.0 in | Wt 204.2 lb

## 2019-07-07 DIAGNOSIS — R1319 Other dysphagia: Secondary | ICD-10-CM | POA: Diagnosis not present

## 2019-07-07 DIAGNOSIS — T17928A Food in respiratory tract, part unspecified causing other injury, initial encounter: Secondary | ICD-10-CM

## 2019-07-07 DIAGNOSIS — T17920A Food in respiratory tract, part unspecified causing asphyxiation, initial encounter: Secondary | ICD-10-CM

## 2019-07-07 NOTE — Patient Instructions (Signed)
If you are age 83 or older, your body mass index should be between 23-30. Your Body mass index is 35.06 kg/m. If this is out of the aforementioned range listed, please consider follow up with your Primary Care Provider.  If you are age 59 or younger, your body mass index should be between 19-25. Your Body mass index is 35.06 kg/m. If this is out of the aformentioned range listed, please consider follow up with your Primary Care Provider.   Call back for recurrent symptoms. Ask for my nurse, [place].  Will consider barium swallow.  Thank you for choosing me and [facility].   Amy Esterwood, PA-C

## 2019-07-07 NOTE — Progress Notes (Signed)
Subjective:    Patient ID: Sheryl Curtis, female    DOB: February 16, 1932, 85 y.o.   MRN: [id number]  HPI Sheryl Curtis is a pleasant 85 year old white female, new to GI today referred by Wyn Quaker, NP/pulmonary for evaluation of dysphasia. Patient has history of hypertension, atrial fibrillation, asthma, sleep apnea, nocturnal hypoxia, osteoarthritis, chronic kidney disease stage III, fibromyalgia, and morbid obesity.  She is maintained on Plavix. Patient has history of colon cancer in 2009 for which she underwent resection and says she had prior colonoscopies done per Dr. Oletta Lamas.  She reports serial colonoscopies after the colon cancer but has not seen him now in several years.  No prior EGD. Patient reports a couple of episodes of dysphasia over the past few years.  She had one bad episode a month or so ago which is what prompted this visit.  She says she had been drinking orange juice, felt like the orange juice stopped in her chest and caused her to choke.  She felt unable to breathe or speak, and her daughter did the Heimlich maneuver.  She had a lot of coughing and choking after that and symptoms resolved.  She says she has not had any more orange juice and does not plan to drink anymore orange juice and has not had any problems since. She denies any ongoing problems with dysphasia to liquids and has not had any dysphagia to solids.  She says her food generally goes down without any difficulty.  She has no chronic issues with heartburn or indigestion.  She says she really only had one episode which was similar with coughing and strangling a couple of years ago.  Review of Systems Pertinent positive and negative review of systems were noted in the above HPI section.  All other review of systems was otherwise negative.  Outpatient Encounter Medications as of 07/07/2019  Medication Sig  . albuterol (PROVENTIL) (2.5 MG/3ML) 0.083% nebulizer solution USE 1 VIAL VIA NEBULIZER EVERY 6 HOURS AS NEEDED  . Albuterol  Sulfate (PROAIR RESPICLICK) 123XX123 (90 Base) MCG/ACT AEPB Inhale 1-2 puffs into the lungs every 6 (six) hours as needed (for shortness of breath and/or wheezing.).  Marland Kitchen Ascorbic Acid (VITAMIN C) 1000 MG tablet Take 1,000 mg by mouth daily.  Marland Kitchen atorvastatin (LIPITOR) 40 MG tablet TAKE 1 TABLET(40 MG) BY MOUTH DAILY  . budesonide-formoterol (SYMBICORT) 160-4.5 MCG/ACT inhaler Inhale 2 puffs into the lungs 2 (two) times daily.  . budesonide-formoterol (SYMBICORT) 160-4.5 MCG/ACT inhaler Inhale 2 puffs into the lungs 2 (two) times daily.  Marland Kitchen CALCIUM PO Take 1,200 mg by mouth daily.   . cetirizine (ZYRTEC) 10 MG tablet Take 10 mg by mouth daily.  . clopidogrel (PLAVIX) 75 MG tablet TAKE 1 TABLET(75 MG) BY MOUTH EVERY OTHER DAY  . DULoxetine (CYMBALTA) 60 MG capsule Take 60 mg by mouth daily.  Marland Kitchen esomeprazole (NEXIUM) 20 MG capsule Take 20 mg by mouth daily at 12 noon.  . furosemide (LASIX) 40 MG tablet Take 1 tablet (40 mg total) by mouth daily.  Marland Kitchen ipratropium (ATROVENT) 0.03 % nasal spray Place 2 sprays into both nostrils every 12 (twelve) hours.  . isosorbide mononitrate (IMDUR) 60 MG 24 hr tablet Take 1 tablet (60 mg total) by mouth daily. (Patient taking differently: Take 30 mg by mouth daily. )  . meclizine (ANTIVERT) 25 MG tablet Take 1 tablet (25 mg total) by mouth 3 (three) times daily as needed for dizziness.  . Nebulizers (COMPRESSOR NEBULIZER) MISC 1 Device by Does not  apply route once.  . potassium chloride SA (K-DUR,KLOR-CON) 20 MEQ tablet TAKE 1 TABLET BY MOUTH  DAILY  . Respiratory Therapy Supplies (FLUTTER) DEVI Use flutter device 3 times a day  . valsartan-hydrochlorothiazide (DIOVAN-HCT) 160-12.5 MG tablet Take 1 tablet by mouth daily.  . [DISCONTINUED] nitroGLYCERIN (NITROSTAT) 0.4 MG SL tablet Place 1 tablet (0.4 mg total) under the tongue every 5 (five) minutes as needed for chest pain.  . [DISCONTINUED] Omega-3 Fatty Acids (FISH OIL) 1000 MG CAPS Take by mouth.   No  facility-administered encounter medications on file as of 07/07/2019.    Allergies  Allergen Reactions  . Aspirin Anaphylaxis, Hives and Shortness Of Breath  . Hydroxyquinolines Hives  . Imipramine Hcl Other (See Comments)    UNKNOWN TO PATIENT  . Pamelor [Nortriptyline Hcl] Other (See Comments)    unknown  . Prednisone Shortness Of Breath and Itching    anxiety  . Procardia [Nifedipine] Other (See Comments)    Unknown   . Tramadol Swelling  . Ace Inhibitors Other (See Comments)    cough  . Influenza Vaccines   . Plaquenil [Hydroxychloroquine Sulfate] Rash   Patient Active Problem List   Diagnosis Date Noted  . Dysphagia 06/14/2019  . Neck pain, chronic 04/21/2019  . Physical deconditioning 12/09/2018  . CKD (chronic kidney disease)   . Morbid obesity ([place])   . Mild persistent asthma with (acute) exacerbation 07/16/2018  . Cough 07/16/2018  . OSA (obstructive sleep apnea) 07/30/2017  . Trochanteric bursitis, right hip 04/21/2017  . Nocturnal hypoxia 03/31/2017  . Sinus bradycardia 08/05/2016  . Hypertensive urgency 08/03/2016  . Asthma 02/19/2016  . Chronic kidney disease (CKD), stage III (moderate) ([place]) 07/05/2015  . CAFL (chronic airflow limitation) ([place]) 07/05/2015  . Generalized OA 07/05/2015  . 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 H/O malignant neoplasm of colon 07/05/2015  . Elevated troponin 07/02/2015  . UTI (lower urinary tract infection) 07/01/2015  . Fibromyalgia syndrome 07/01/2015  . Hyponatremia 07/01/2015  . Urinary tract infectious disease   . Pulmonary nodules/lesions, multiple 04/11/2014  . Abdominal pain, generalized 04/07/2014  . Coronary atherosclerosis of native coronary artery 10/12/2013  . Essential hypertension, benign   . Other and unspecified angina pectoris   . Atrial fibrillation ([place]) 04/19/2010  . Allergic rhinitis 04/19/2010  . DYSPNEA 04/19/2010  . CHEST PAIN, ATYPICAL 04/19/2010  . CANCER, COLORECTAL 04/18/2010  . Essential  hypertension 04/18/2010  . G E R D 04/18/2010  . Fibromyalgia 04/18/2010  . INSOMNIA 04/18/2010   Social History   Socioeconomic History  . Marital status: Widowed    Spouse name: Not on file  . Number of children: 4  . Years of education: Not on file  . Highest education level: Not on file  Occupational History  . Not on file  Social Needs  . Financial resource strain: Not hard at all  . Food insecurity    Worry: Never true    Inability: Never true  . Transportation needs    Medical: No    Non-medical: No  Tobacco Use  . Smoking status: Former Smoker    Packs/day: 1.00    Years: 12.00    Pack years: 12.00    Types: Cigarettes    Start date: 10/28/1949    Quit date: 10/28/1962    Years since quitting: 56.7  . Smokeless tobacco: Never Used  Substance and Sexual Activity  . Alcohol use: No  . Drug use: No  . Sexual activity: Not on file  Lifestyle  .  Physical activity    Days per week: 0 days    Minutes per session: 0 min  . Stress: Only a little  Relationships  . Social connections    Talks on phone: More than three times a week    Gets together: More than three times a week    Attends religious service: 1 to 4 times per year    Active member of club or organization: No    Attends meetings of clubs or organizations: Never    Relationship status: Widowed  . Intimate partner violence    Fear of current or ex partner: Not on file    Emotionally abused: Not on file    Physically abused: Not on file    Forced sexual activity: Not on file  Other Topics Concern  . Not on file  Social History Narrative  . Not on file    Ms. Eckstein's family history includes Bone cancer in her sister; CAD in her brother; Heart attack (age of onset: 29) in her brother; Throat cancer in her daughter.      Objective:    Vitals:   07/07/19 1030  BP: (!) 170/60  Pulse: 69  Temp: 97.9 F (36.6 C)    Physical Exam;Well-developed well-nourished elderly white female in no acute  distress.   Weight, 204 BMI 35.0  HEENT; nontraumatic normocephalic, EOMI, PE R LA, sclera anicteric. Oropharynx; not examined/wearing mask/COVID Neck; supple, no JVD Cardiovascular; regular rate and rhythm with S1-S2, no murmur rub or gallop Pulmonary; Clear bilaterally Abdomen; soft, obese nontender, nondistended, no palpable mass or hepatosplenomegaly, bowel sounds are active Rectal; not done today Skin; benign exam, no jaundice rash or appreciable lesions Extremities; no clubbing cyanosis or edema skin warm and dry Neuro/Psych; alert and oriented x4, grossly nonfocal mood and affect appropriate       Assessment & Plan:   #53 85 year old female with recent episode of choking on orange juice by her description was an episode of aspiration.  Patient reports similar episode about a year ago. She denies any problems with dysphagia to solids or liquids on a regular basis.  No chronic GERD symptoms though she has been taking Nexium 20 mg daily.  Symptoms are consistent with aspiration and not chronic GERD or dysphagia  #2 atrial fibrillation 3.  Chronic antiplatelet therapy-on Plavix 4.  Asthma 5.  Obstructive sleep apnea 6.  History of nocturnal hypoxia 7.  Chronic kidney disease 8.  Fibromyalgia 9.  Obesity 10.  History of colon cancer 2009 status post resection-prior colonoscopies per Dr. Oletta Lamas,  Plan; I think it would be reasonable to do a barium swallow to evaluate both swallowing function and esophagus. Patient is not inclined to have further any further work-up at this time since she has not had any problems since that one episode.  She says she is trying to avoid any unnecessary exposures to COVID. She will call if she has any further episodes, and at that time would proceed with barium swallow with tablet.    S  PA-C 07/07/2019   Cc: Lauraine Rinne, NP

## 2019-07-12 NOTE — Telephone Encounter (Signed)
This encounter was created in error - please disregard.

## 2019-07-19 ENCOUNTER — Telehealth: Payer: Self-pay | Admitting: Physical Medicine and Rehabilitation

## 2019-07-19 NOTE — Progress Notes (Signed)
SHAWNELL Curtis - 85 y.o. female MRN [id number]  Date of birth: 1931/12/04  Office Visit Note: Visit Date: 06/29/2019 PCP: Alroy Dust, L.Marlou Sa, MD Referred by: Alroy Dust, L.Marlou Sa, MD  Subjective: Chief Complaint  Patient presents with  . Lower Back - Pain  . Right Leg - Pain   HPI: Sheryl Curtis is a 85 y.o. female who comes in today For reevaluation and possible injection of her lumbar spine.  Originally a patient of Dr. Trevor Mace with hip pain.  She has multifactorial spondylosis with right lateral recess narrowing and right foraminal narrowing at L4-5.  She has facet arthropathy at L4-5 and L5-S1.  She has intermittent hip and thigh pain but mostly axial low back pain.  Interestingly we saw her over the last several years and completed intermittent epidural injection with decent relief.  She is getting a lot of arthritic type back pain worse with standing worse with extension.  On exam today consistent pain with extension.  No real referral pain at this point in the legs.  We are going to try diagnostic facet blocks with goal towards radiofrequency ablation if she gets more than 50% relief on to double diagnostic blocks.  She has had physical therapy she has had medication management.  She has had time and this is been a chronic ongoing problem.  Case is complicated by obesity as well as fibromyalgia.  If she does not get great relief would look at a interlaminar epidural steroid injection.  ROS Otherwise per HPI.  Assessment & Plan: Visit Diagnoses:  1. Spondylosis without myelopathy or radiculopathy, lumbar region     Plan: No additional findings.   Meds & Orders: No orders of the defined types were placed in this encounter.   Orders Placed This Encounter  Procedures  . Facet Injection  . XR C-ARM NO REPORT    Follow-up: Return if symptoms worsen or fail to improve, for Review Pain Diary.   Procedures: No procedures performed  Lumbar Diagnostic Facet Joint Nerve Block with  Fluoroscopic Guidance   Patient: Sheryl Curtis      Date of Birth: July 19, 1932 MRN: [id number] PCP: Alroy Dust, L.Marlou Sa, MD      Visit Date: 06/29/2019   Universal Protocol:    Date/Time: 09/21/201:10 PM  Consent Given By: the patient  Position: PRONE  Additional Comments: Vital signs were monitored before and after the procedure. Patient was prepped and draped in the usual sterile fashion. The correct patient, procedure, and site was verified.   Injection Procedure Details:  Procedure Site One Meds Administered: No orders of the defined types were placed in this encounter.    Laterality: Bilateral  Location/Site:  L4-L5 L5-S1  Needle size: 22 ga.  Needle type:spinal  Needle Placement: Oblique pedical  Findings:   -Comments: There was excellent flow of contrast along the articular pillars without intravascular flow.  Procedure Details: The fluoroscope beam is vertically oriented in AP and then obliqued 15 to 20 degrees to the ipsilateral side of the desired nerve to achieve the "Scotty dog" appearance.  The skin over the target area of the junction of the superior articulating process and the transverse process (sacral ala if blocking the L5 dorsal rami) was locally anesthetized with a 1 ml volume of 1% Lidocaine without Epinephrine.  The spinal needle was inserted and advanced in a trajectory view down to the target.   After contact with periosteum and negative aspirate for blood and CSF, correct placement without intravascular or epidural spread was confirmed  by injecting 0.5 ml. of Isovue-250.  A spot radiograph was obtained of this image.    Next, a 0.5 ml. volume of the injectate described above was injected. The needle was then redirected to the other facet joint nerves mentioned above if needed.  Prior to the procedure, the patient was given a Pain Diary which was completed for baseline measurements.  After the procedure, the patient rated their pain every 30 minutes  and will continue rating at this frequency for a total of 5 hours.  The patient has been asked to complete the Diary and return to [place] by mail, fax or hand delivered as soon as possible.   Additional Comments:  The patient tolerated the procedure well Dressing: 2 x 2 sterile gauze and Band-Aid    Post-procedure details: Patient was observed during the procedure. Post-procedure instructions were reviewed.  Patient left the clinic in stable condition.    Clinical History: MRI LUMBAR SPINE WITHOUT CONTRAST  TECHNIQUE: Multiplanar, multisequence MR imaging of the lumbar spine was performed. No intravenous contrast was administered.  COMPARISON:  MRI lumbar spine July 01, 2015  FINDINGS: SEGMENTATION: For the purposes of this report, the last well-formed intervertebral disc will be described as L5-S1.  ALIGNMENT: Maintenance of the lumbar lordosis. Minimal grade 1 L5-S1 retrolisthesis without spondylolysis.  VERTEBRAE:Vertebral bodies are intact. Similar severe L3-4, L4-5 and L5-S1 disc height loss, associated with levoscoliosis. Severe T11-12 and T12-L1 disc height loss associated with dextroscoliosis. Decreased T2 signal within all disc compatible with desiccation with multilevel vacuum disc. Moderate lower lumbar subacute on chronic discogenic endplate changes. No bone marrow signal abnormality to suggest acute osseous process.  CONUS MEDULLARIS: Conus medullaris terminates at T12-L1 and demonstrates normal morphology and signal characteristics. Similar 4 mm low signal nodule RIGHT thecal sac at L4 best seen on sagittal T2 10/17. Cauda equina is normal.  PARASPINAL AND SOFT TISSUES: Included prevertebral and paraspinal soft tissues are nonacute. Moderate to severe symmetric paraspinal muscle atrophy. Stable renal cysts measuring up to 15 mm on the RIGHT.  DISC LEVELS:  L1-2: Stable small broad-based disc bulge asymmetric to LEFT. Mild facet arthropathy  and ligamentum flavum redundancy without canal stenosis. Minimal LEFT neural foraminal narrowing.  L2-3: Stable small broad-based disc bulge. Mild facet arthropathy and ligamentum flavum redundancy without canal stenosis. Mild to moderate LEFT neural foraminal narrowing.  L3-4: Small broad-based disc bulge, moderate RIGHT subarticular disc protrusion. Mild facet arthropathy and ligamentum flavum redundancy. No canal stenosis though partial chronic effacement RIGHT lateral recess could affect the traversing RIGHT L4 nerve. Moderate to severe RIGHT, mild LEFT neural foraminal narrowing.  L4-5: Stable moderate broad-based disc bulge asymmetric to the RIGHT. Moderate RIGHT and mild LEFT facet arthropathy with ligamentum flavum redundancy. No canal stenosis. Severe RIGHT, moderate LEFT neural foraminal narrowing.  L5-S1: Moderate broad-based disc bulge. Mild to moderate facet arthropathy and ligamentum flavum redundancy without canal stenosis. Moderate to severe RIGHT, severe LEFT neural foraminal narrowing.  IMPRESSION: Stable degenerative change of the lumbar spine without fracture or malalignment.  No canal stenosis. Neural foraminal narrowing at all lumbar levels: Severe on the RIGHT at L4-5 and severe on the LEFT at L5-S1.  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   Electronically Signed   By: Elon Alas M.D.   On: 08/22/2016 03:14   She reports that she quit smoking about 56 years ago. Her smoking use included cigarettes. She started smoking about 69 years ago. She has a 12.00 pack-year  smoking history. She has never used smokeless tobacco. No results for input(s): HGBA1C, LABURIC in the last 8760 hours.  Objective:  VS:  HT:    WT:   BMI:     BP:(!) 172/59  HR:83bpm  TEMP: ( )  RESP:  Physical Exam Musculoskeletal:     Comments: Patient has pain with extension and facet joint loading consistent with her low back pain.  She has  no pain over the greater trochanters and she has good distal strength.     Ortho Exam Imaging: No results found.  Past Medical/Family/Surgical/Social History: Medications & Allergies reviewed per EMR, new medications updated. Patient Active Problem List   Diagnosis Date Noted  . Dysphagia 06/14/2019  . Neck pain, chronic 04/21/2019  . Physical deconditioning 12/09/2018  . CKD (chronic kidney disease)   . Morbid obesity ([place])   . Mild persistent asthma with (acute) exacerbation 07/16/2018  . Cough 07/16/2018  . OSA (obstructive sleep apnea) 07/30/2017  . Trochanteric bursitis, right hip 04/21/2017  . Nocturnal hypoxia 03/31/2017  . Sinus bradycardia 08/05/2016  . Hypertensive urgency 08/03/2016  . Asthma 02/19/2016  . Chronic kidney disease (CKD), stage III (moderate) ([place]) 07/05/2015  . CAFL (chronic airflow limitation) ([place]) 07/05/2015  . Generalized OA 07/05/2015  . 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 H/O malignant neoplasm of colon 07/05/2015  . Elevated troponin 07/02/2015  . UTI (lower urinary tract infection) 07/01/2015  . Fibromyalgia syndrome 07/01/2015  . Hyponatremia 07/01/2015  . Urinary tract infectious disease   . Pulmonary nodules/lesions, multiple 04/11/2014  . Abdominal pain, generalized 04/07/2014  . Coronary atherosclerosis of native coronary artery 10/12/2013  . Essential hypertension, benign   . Other and unspecified angina pectoris   . Atrial fibrillation ([place]) 04/19/2010  . Allergic rhinitis 04/19/2010  . DYSPNEA 04/19/2010  . CHEST PAIN, ATYPICAL 04/19/2010  . CANCER, COLORECTAL 04/18/2010  . Essential hypertension 04/18/2010  . G E R D 04/18/2010  . Fibromyalgia 04/18/2010  . INSOMNIA 04/18/2010   Past Medical History:  Diagnosis Date  . ALLERGIC RHINITIS   . Atrial fibrillation ([place])   . CANCER, COLORECTAL   . CHEST PAIN, ATYPICAL   . CKD (chronic kidney disease)   . DYSPNEA   . Essential hypertension, benign   . FIBROMYALGIA    . G E R D   . History of stress test    Myoview 8/16:  EF 69%, anterior and apical defect c/w breast attenuation; Low Risk   . INSOMNIA   . Morbid obesity ([place])   . Other and unspecified angina pectoris   . Sleep apnea    Family History  Problem Relation Age of Onset  . CAD Brother   . Heart attack Brother 14  . Bone cancer Sister   . Throat cancer Daughter    Past Surgical History:  Procedure Laterality Date  . ABDOMINAL HYSTERECTOMY    . BACK SURGERY    . BREAST SURGERY    . COLON SURGERY    . CORONARY STENT PLACEMENT    . PERCUTANEOUS CORONARY STENT INTERVENTION (PCI-S) N/A 05/19/2013   Procedure: PERCUTANEOUS CORONARY STENT INTERVENTION (PCI-S);  Surgeon: Jettie Booze, MD;  Location: [facility] CATH LAB;  Service: Cardiovascular;  Laterality: N/A;   Social History   Occupational History  . Not on file  Tobacco Use  . Smoking status: Former Smoker    Packs/day: 1.00    Years: 12.00    Pack years: 12.00    Types: Cigarettes    Start  date: 10/28/1949    Quit date: 10/28/1962    Years since quitting: 56.7  . Smokeless tobacco: Never Used  Substance and Sexual Activity  . Alcohol use: No  . Drug use: No  . Sexual activity: Not on file

## 2019-07-19 NOTE — Telephone Encounter (Signed)
See if she got temporary relief of her back pain.  She reported the day of that she was not having much in the way of leg pain at all even prior to the injection.  If she got temporary relief of her back pain even for a short while then please explained to her that we do a double injection and if it is temporary we can do an ablation where we heat out for "the nerves to those little joints for longer term relief.

## 2019-07-19 NOTE — Procedures (Signed)
Lumbar Diagnostic Facet Joint Nerve Block with Fluoroscopic Guidance   Patient: Sheryl Curtis      Date of Birth: 08-29-1932 MRN: [id number] PCP: Alroy Dust, L.Marlou Sa, MD      Visit Date: 06/29/2019   Universal Protocol:    Date/Time: 09/21/201:10 PM  Consent Given By: the patient  Position: PRONE  Additional Comments: Vital signs were monitored before and after the procedure. Patient was prepped and draped in the usual sterile fashion. The correct patient, procedure, and site was verified.   Injection Procedure Details:  Procedure Site One Meds Administered: No orders of the defined types were placed in this encounter.    Laterality: Bilateral  Location/Site:  L4-L5 L5-S1  Needle size: 22 ga.  Needle type:spinal  Needle Placement: Oblique pedical  Findings:   -Comments: There was excellent flow of contrast along the articular pillars without intravascular flow.  Procedure Details: The fluoroscope beam is vertically oriented in AP and then obliqued 15 to 20 degrees to the ipsilateral side of the desired nerve to achieve the "Scotty dog" appearance.  The skin over the target area of the junction of the superior articulating process and the transverse process (sacral ala if blocking the L5 dorsal rami) was locally anesthetized with a 1 ml volume of 1% Lidocaine without Epinephrine.  The spinal needle was inserted and advanced in a trajectory view down to the target.   After contact with periosteum and negative aspirate for blood and CSF, correct placement without intravascular or epidural spread was confirmed by injecting 0.5 ml. of Isovue-250.  A spot radiograph was obtained of this image.    Next, a 0.5 ml. volume of the injectate described above was injected. The needle was then redirected to the other facet joint nerves mentioned above if needed.  Prior to the procedure, the patient was given a Pain Diary which was completed for baseline measurements.  After the  procedure, the patient rated their pain every 30 minutes and will continue rating at this frequency for a total of 5 hours.  The patient has been asked to complete the Diary and return to [place] by mail, fax or hand delivered as soon as possible.   Additional Comments:  The patient tolerated the procedure well Dressing: 2 x 2 sterile gauze and Band-Aid    Post-procedure details: Patient was observed during the procedure. Post-procedure instructions were reviewed.  Patient left the clinic in stable condition.

## 2019-07-20 NOTE — Telephone Encounter (Signed)
Left message #1

## 2019-07-22 ENCOUNTER — Other Ambulatory Visit: Payer: Self-pay | Admitting: *Deleted

## 2019-07-23 NOTE — Telephone Encounter (Signed)
Patient called back and wanted to reschedule neck injections. I returned her call and scheduled her for 10/19 at 1400 with a driver.

## 2019-07-26 ENCOUNTER — Ambulatory Visit: Payer: Self-pay | Admitting: *Deleted

## 2019-07-28 NOTE — Patient Outreach (Signed)
[facility]) Care Management  07/28/2019   JUDAH DEVOLL [birth year] [id number]  RN Health Coach telephone call to patient.  [employer] compliance verified. Per patient she is doing fair. Patient stated that she has head and neck pain.   Patient states that she also has some pain radiating down her leg.Patient has been getting cortisone shots.   Per patient she has been checking her blood pressure. Patient is eating a low sodium diet. Per patient she is unable to take the flu shot. Patient has agreed to follow up outreach calls.   Current Medications:  Current Outpatient Medications  Medication Sig Dispense Refill  . albuterol (PROVENTIL) (2.5 MG/3ML) 0.083% nebulizer solution USE 1 VIAL VIA NEBULIZER EVERY 6 HOURS AS NEEDED 1080 mL 1  . Albuterol Sulfate (PROAIR RESPICLICK) 123XX123 (90 Base) MCG/ACT AEPB Inhale 1-2 puffs into the lungs every 6 (six) hours as needed (for shortness of breath and/or wheezing.). 1 each 6  . Ascorbic Acid (VITAMIN C) 1000 MG tablet Take 1,000 mg by mouth daily.    Marland Kitchen atorvastatin (LIPITOR) 40 MG tablet TAKE 1 TABLET(40 MG) BY MOUTH DAILY 90 tablet 3  . budesonide-formoterol (SYMBICORT) 160-4.5 MCG/ACT inhaler Inhale 2 puffs into the lungs 2 (two) times daily. 1 Inhaler 2  . budesonide-formoterol (SYMBICORT) 160-4.5 MCG/ACT inhaler Inhale 2 puffs into the lungs 2 (two) times daily. 1 Inhaler 0  . CALCIUM PO Take 1,200 mg by mouth daily.     . cetirizine (ZYRTEC) 10 MG tablet Take 10 mg by mouth daily.    . clopidogrel (PLAVIX) 75 MG tablet TAKE 1 TABLET(75 MG) BY MOUTH EVERY OTHER DAY 45 tablet 2  . DULoxetine (CYMBALTA) 60 MG capsule Take 60 mg by mouth daily.    Marland Kitchen esomeprazole (NEXIUM) 20 MG capsule Take 20 mg by mouth daily at 12 noon.    . furosemide (LASIX) 40 MG tablet Take 1 tablet (40 mg total) by mouth daily. 90 tablet 3  . ipratropium (ATROVENT) 0.03 % nasal spray Place 2 sprays into both nostrils every 12 (twelve) hours. 30 mL 1  . isosorbide  mononitrate (IMDUR) 60 MG 24 hr tablet Take 1 tablet (60 mg total) by mouth daily. (Patient taking differently: Take 30 mg by mouth daily. )    . meclizine (ANTIVERT) 25 MG tablet Take 1 tablet (25 mg total) by mouth 3 (three) times daily as needed for dizziness. 30 tablet 0  . Nebulizers (COMPRESSOR NEBULIZER) MISC 1 Device by Does not apply route once. 1 each 0  . potassium chloride SA (K-DUR,KLOR-CON) 20 MEQ tablet TAKE 1 TABLET BY MOUTH  DAILY 90 tablet 3  . Respiratory Therapy Supplies (FLUTTER) DEVI Use flutter device 3 times a day 1 each 0  . valsartan-hydrochlorothiazide (DIOVAN-HCT) 160-12.5 MG tablet Take 1 tablet by mouth daily.     No current facility-administered medications for this visit.     Functional Status:  In your present state of health, do you have any difficulty performing the following activities: 07/22/2019 04/21/2019  Hearing? Y Y  Comment - -  Vision? - Y  Difficulty concentrating or making decisions? N N  Walking or climbing stairs? Y Y  Comment arthritis and back painn radiating down leg -  Dressing or bathing? N Y  Doing errands, shopping? Aggie Moats  Comment daughter [name] assists Facilities manager and eating ? N -  Using the Toilet? N -  In the past six months, have you accidently leaked urine? Y -  Comment - -  Do you have problems with loss of bowel control? N -  Comment - -  Managing your Medications? N -  Managing your Finances? N -  Comment - -  Housekeeping or managing your Housekeeping? Y -  Comment daughters help out -  Some recent data might be hidden    Fall/Depression Screening: Fall Risk  07/22/2019 04/21/2019 04/14/2019  Falls in the past year? 1 1 1   Comment - - member reports fall last Thursday  Number falls in past yr: 0 1 1  Injury with Fall? 1 1 1   Comment - - -  Risk Factor Category  - - -  Risk for fall due to : History of fall(s);Impaired balance/gait;Impaired mobility Other (Comment) History of fall(s);Impaired mobility;Other  (Comment)  Risk for fall due to: Comment - swimmy headed back pain  Follow up Falls evaluation completed;Education provided - Falls evaluation completed;Education provided;Falls prevention discussed  Comment - - .Member states she was working in the garden and put a banana peeling under the rose bush and fell over and did not hit head; however, fell on her "Right cheek bone and R hand" and today R hand still aches. member refuses to follow up with PCP   PHQ 2/9 Scores 07/22/2019 04/21/2019 01/13/2019 01/04/2019 10/29/2018 08/28/2015 06/21/2013  PHQ - 2 Score 0 0 2 0 0 0 1  PHQ- 9 Score - - 6 - - - -  Exception Documentation - - Patient refusal - - - -    Assessment:  Patient checks blood pressure Patient is having pain in neck and head  She does not take the flu shot Patient had recent cortisone shot in back Patient is trying to eat a low sodium Patient would benefit from more education on low sodium diet  Plan:  RN sent 2020 calendar book RN sent picture sheet of high and low sodium foods RN sent A  Matter of choice blood pressure control RN discussed monitoring blood pressure RN discussed fall prevention RN sent barriers letter and assessment to PCP RN will follow up within the month of December  Lajuana Patchell [place] [facility] [phone]

## 2019-07-30 ENCOUNTER — Telehealth: Payer: Self-pay | Admitting: Pulmonary Disease

## 2019-07-30 NOTE — Telephone Encounter (Signed)
07/30/2019 1739  Triage,  The patient is due for a follow-up visit with Dr. Elsworth Soho in November.  I believe we have a schedule up till December.  Please make sure the patient is scheduled for a follow-up visit with him.  Wyn Quaker, FNP

## 2019-08-02 NOTE — Telephone Encounter (Signed)
Spoke with pt. She has been scheduled to see Dr. Elsworth Soho on 09/06/2019 at 1330. Nothing further was needed.

## 2019-08-06 ENCOUNTER — Other Ambulatory Visit: Payer: Self-pay | Admitting: Interventional Cardiology

## 2019-08-16 ENCOUNTER — Other Ambulatory Visit: Payer: Self-pay

## 2019-08-16 ENCOUNTER — Encounter: Payer: Self-pay | Admitting: Physical Medicine and Rehabilitation

## 2019-08-16 ENCOUNTER — Ambulatory Visit: Payer: Self-pay

## 2019-08-16 ENCOUNTER — Telehealth: Payer: Self-pay | Admitting: *Deleted

## 2019-08-16 ENCOUNTER — Ambulatory Visit (INDEPENDENT_AMBULATORY_CARE_PROVIDER_SITE_OTHER): Payer: Medicare Other | Admitting: Physical Medicine and Rehabilitation

## 2019-08-16 VITALS — BP 143/56 | HR 70

## 2019-08-16 DIAGNOSIS — M47812 Spondylosis without myelopathy or radiculopathy, cervical region: Secondary | ICD-10-CM

## 2019-08-16 DIAGNOSIS — G894 Chronic pain syndrome: Secondary | ICD-10-CM

## 2019-08-16 DIAGNOSIS — M7061 Trochanteric bursitis, right hip: Secondary | ICD-10-CM

## 2019-08-16 DIAGNOSIS — M542 Cervicalgia: Secondary | ICD-10-CM

## 2019-08-16 DIAGNOSIS — M797 Fibromyalgia: Secondary | ICD-10-CM | POA: Diagnosis not present

## 2019-08-16 NOTE — Progress Notes (Signed)
Sheryl Curtis - 85 y.o. female MRN [id number]  Date of birth: 1932-10-07  Office Visit Note: Visit Date: 08/16/2019 PCP: Alroy Dust, L.Marlou Sa, MD Referred by: Alroy Dust, L.Marlou Sa, MD  Subjective: Chief Complaint  Patient presents with  . Neck - Pain  . Left Shoulder - Pain  . Right Shoulder - Pain  . Left Arm - Pain  . Right Arm - Pain  . Left Hand - Pain  . Right Hand - Pain   HPI: Sheryl Curtis is a 85 y.o. female who comes in today For evaluation and planned left-sided facet joint block of the cervical spine.  Patient suffers greatly from chronic pain syndrome and fibromyalgia.  Today when she comes in a set of her left neck pain that she had called in about she is having right having right greater trochanteric thigh pain.  Please see our prior notes for further review and details of her history.  Please see Dr. Trevor Mace notes as well.  She complains of pain on the right side with laying on the right side.  No groin pain.  No paresthesias.  She also has pain in the neck bilateral shoulders bilateral arms bilateral hands.  No paresthesias no shooting pains down the arms.  Prior cervical spine x-rays revealed reversal at C5-6 as well as upper left-sided facet joint arthritis which was quite severe.  She has not had MRI of the cervical spine.  She reports the right sided thigh symptoms are worse today.  She is still unsure about having an injection in the cervical spine but I think that is the next step to try to get her some relief.  She asked today if there is anything from a spine standpoint that is going to get rid of her pain permanently and I had to tell her that that is just not really going to be an option at this point.  I do not see any surgical or medication or interventional treatments that will just make her pain go away.  She has multilevel multijoint multi body segment pain and arthritis.  Review of Systems  Constitutional: Negative for chills, fever, malaise/fatigue and weight loss.   HENT: Negative for hearing loss and sinus pain.   Eyes: Negative for blurred vision, double vision and photophobia.  Respiratory: Negative for cough and shortness of breath.   Cardiovascular: Negative for chest pain, palpitations and leg swelling.  Gastrointestinal: Negative for abdominal pain, nausea and vomiting.  Genitourinary: Negative for flank pain.  Musculoskeletal: Positive for back pain, joint pain and neck pain. Negative for myalgias.  Skin: Negative for itching and rash.  Neurological: Negative for tremors, focal weakness and weakness.  Endo/Heme/Allergies: Negative.   Psychiatric/Behavioral: Negative for depression.  All other systems reviewed and are negative.  Otherwise per HPI.  Assessment & Plan: Visit Diagnoses:  1. Greater trochanteric bursitis, right     Plan: Findings:  Right-sided greater trochanteric bursitis versus radicular pain from history of stenosis.  We will go ahead and complete diagnostic hopefully therapeutic greater trochanteric bursa injection today with fluoroscopic guidance Duda body habitus.  If she is not getting better from that would repeat right L4 transforaminal epidural steroid injection at some point.  In terms of her neck pain I think this is multifactorial as well with facet arthritis as well as myofascial versus fibromyalgia in general.  We will see how she does with the bursa injection if her neck pain is still bothering her would look at facet joint block at that  point.  Would have a very short decision to look at cervical MRI.    Meds & Orders: No orders of the defined types were placed in this encounter.   Orders Placed This Encounter  Procedures  . Large Joint Inj  . XR C-ARM NO REPORT    Follow-up: Return for 2 to 3 weeks for potential cervical facet joint block or epidural lumbar..   Procedures: Large Joint Inj: R hip joint on 08/18/2019 6:16 AM Indications: pain and diagnostic evaluation Details: 22 G 3.5 in needle,  fluoroscopy-guided lateral approach  Arthrogram: No  Medications: 4 mL lidocaine 2 %; 80 mg triamcinolone acetonide 40 MG/ML; 4 mL bupivacaine 0.25 % Outcome: tolerated well, no immediate complications  There was excellent flow of contrast outlined the greater trochanteric bursa without vascular uptake. Procedure, treatment alternatives, risks and benefits explained, specific risks discussed. Consent was given by the patient. Immediately prior to procedure a time out was called to verify the correct patient, procedure, equipment, support staff and site/side marked as required. Patient was prepped and draped in the usual sterile fashion.      No notes on file   Clinical History: MRI LUMBAR SPINE WITHOUT CONTRAST  TECHNIQUE: Multiplanar, multisequence MR imaging of the lumbar spine was performed. No intravenous contrast was administered.  COMPARISON:  MRI lumbar spine July 01, 2015  FINDINGS: SEGMENTATION: For the purposes of this report, the last well-formed intervertebral disc will be described as L5-S1.  ALIGNMENT: Maintenance of the lumbar lordosis. Minimal grade 1 L5-S1 retrolisthesis without spondylolysis.  VERTEBRAE:Vertebral bodies are intact. Similar severe L3-4, L4-5 and L5-S1 disc height loss, associated with levoscoliosis. Severe T11-12 and T12-L1 disc height loss associated with dextroscoliosis. Decreased T2 signal within all disc compatible with desiccation with multilevel vacuum disc. Moderate lower lumbar subacute on chronic discogenic endplate changes. No bone marrow signal abnormality to suggest acute osseous process.  CONUS MEDULLARIS: Conus medullaris terminates at T12-L1 and demonstrates normal morphology and signal characteristics. Similar 4 mm low signal nodule RIGHT thecal sac at L4 best seen on sagittal T2 10/17. Cauda equina is normal.  PARASPINAL AND SOFT TISSUES: Included prevertebral and paraspinal soft tissues are nonacute. Moderate  to severe symmetric paraspinal muscle atrophy. Stable renal cysts measuring up to 15 mm on the RIGHT.  DISC LEVELS:  L1-2: Stable small broad-based disc bulge asymmetric to LEFT. Mild facet arthropathy and ligamentum flavum redundancy without canal stenosis. Minimal LEFT neural foraminal narrowing.  L2-3: Stable small broad-based disc bulge. Mild facet arthropathy and ligamentum flavum redundancy without canal stenosis. Mild to moderate LEFT neural foraminal narrowing.  L3-4: Small broad-based disc bulge, moderate RIGHT subarticular disc protrusion. Mild facet arthropathy and ligamentum flavum redundancy. No canal stenosis though partial chronic effacement RIGHT lateral recess could affect the traversing RIGHT L4 nerve. Moderate to severe RIGHT, mild LEFT neural foraminal narrowing.  L4-5: Stable moderate broad-based disc bulge asymmetric to the RIGHT. Moderate RIGHT and mild LEFT facet arthropathy with ligamentum flavum redundancy. No canal stenosis. Severe RIGHT, moderate LEFT neural foraminal narrowing.  L5-S1: Moderate broad-based disc bulge. Mild to moderate facet arthropathy and ligamentum flavum redundancy without canal stenosis. Moderate to severe RIGHT, severe LEFT neural foraminal narrowing.  IMPRESSION: Stable degenerative change of the lumbar spine without fracture or malalignment.  No canal stenosis. Neural foraminal narrowing at all lumbar levels: Severe on the RIGHT at L4-5 and severe on the LEFT at L5-S1.  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   Electronically Signed  By: Elon Alas M.D.   On: 08/22/2016 03:14   She reports that she quit smoking about 56 years ago. Her smoking use included cigarettes. She started smoking about 69 years ago. She has a 12.00 pack-year smoking history. She has never used smokeless tobacco. No results for input(s): HGBA1C, LABURIC in the last 8760 hours.  Objective:  VS:  HT:     WT:   BMI:     BP:(!) 143/56  HR:70bpm  TEMP: ( )  RESP:  Physical Exam Vitals signs and nursing note reviewed.  Constitutional:      General: She is not in acute distress.    Appearance: Normal appearance. She is well-developed. She is not ill-appearing.  HENT:     Head: Normocephalic and atraumatic.  Eyes:     Conjunctiva/sclera: Conjunctivae normal.     Pupils: Pupils are equal, round, and reactive to light.  Neck:     Musculoskeletal: Neck supple. Muscular tenderness present. No neck rigidity.  Cardiovascular:     Rate and Rhythm: Normal rate.     Pulses: Normal pulses.  Pulmonary:     Effort: Pulmonary effort is normal.  Musculoskeletal:     Right lower leg: No edema.     Left lower leg: No edema.     Comments: Examination cervical spine shows forward flexed cervical spine with decreased range of motion to left rotation compared to right.  She has pain with extension but not any sign of Spurling's positivity.  She has impingement of the shoulders bilaterally she has good upper extremity strength.  She has bilateral hand arthritis.  This is in the thumb as well as the distal joints.  No synovitis.  She has difficulty rising from a seated position she does have pain over the right greater trochanter not the left.  She has no pain with hip rotation has good distal strength.  Lymphadenopathy:     Cervical: No cervical adenopathy.  Skin:    General: Skin is warm and dry.     Findings: No erythema or rash.  Neurological:     General: No focal deficit present.     Mental Status: She is alert and oriented to person, place, and time.     Sensory: No sensory deficit.     Motor: No abnormal muscle tone.     Coordination: Coordination normal.     Gait: Gait normal.  Psychiatric:        Mood and Affect: Mood normal.        Behavior: Behavior normal.     Ortho Exam Imaging: No results found.  Past Medical/Family/Surgical/Social History: Medications & Allergies reviewed per  EMR, new medications updated. Patient Active Problem List   Diagnosis Date Noted  . Dysphagia 06/14/2019  . Neck pain, chronic 04/21/2019  . Physical deconditioning 12/09/2018  . CKD (chronic kidney disease)   . Morbid obesity ([place])   . Mild persistent asthma with (acute) exacerbation 07/16/2018  . Cough 07/16/2018  . OSA (obstructive sleep apnea) 07/30/2017  . Trochanteric bursitis, right hip 04/21/2017  . Nocturnal hypoxia 03/31/2017  . Sinus bradycardia 08/05/2016  . Hypertensive urgency 08/03/2016  . Asthma 02/19/2016  . Chronic kidney disease (CKD), stage III (moderate) 07/05/2015  . CAFL (chronic airflow limitation) ([place]) 07/05/2015  . Generalized OA 07/05/2015  . 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 H/O malignant neoplasm of colon 07/05/2015  . Elevated troponin 07/02/2015  . UTI (lower urinary tract infection) 07/01/2015  . Fibromyalgia syndrome 07/01/2015  . Hyponatremia  07/01/2015  . Urinary tract infectious disease   . Pulmonary nodules/lesions, multiple 04/11/2014  . Abdominal pain, generalized 04/07/2014  . Coronary atherosclerosis of native coronary artery 10/12/2013  . Essential hypertension, benign   . Other and unspecified angina pectoris   . Atrial fibrillation ([place]) 04/19/2010  . Allergic rhinitis 04/19/2010  . DYSPNEA 04/19/2010  . CHEST PAIN, ATYPICAL 04/19/2010  . CANCER, COLORECTAL 04/18/2010  . Essential hypertension 04/18/2010  . G E R D 04/18/2010  . Fibromyalgia 04/18/2010  . INSOMNIA 04/18/2010   Past Medical History:  Diagnosis Date  . ALLERGIC RHINITIS   . Atrial fibrillation ([place])   . CANCER, COLORECTAL   . CHEST PAIN, ATYPICAL   . CKD (chronic kidney disease)   . DYSPNEA   . Essential hypertension, benign   . FIBROMYALGIA   . G E R D   . History of stress test    Myoview 8/16:  EF 69%, anterior and apical defect c/w breast attenuation; Low Risk   . INSOMNIA   . Morbid obesity ([place])   . Other and unspecified angina pectoris    . Sleep apnea    Family History  Problem Relation Age of Onset  . CAD Brother   . Heart attack Brother 8  . Bone cancer Sister   . Throat cancer Daughter    Past Surgical History:  Procedure Laterality Date  . ABDOMINAL HYSTERECTOMY    . BACK SURGERY    . BREAST SURGERY    . COLON SURGERY    . CORONARY STENT PLACEMENT    . PERCUTANEOUS CORONARY STENT INTERVENTION (PCI-S) N/A 05/19/2013   Procedure: PERCUTANEOUS CORONARY STENT INTERVENTION (PCI-S);  Surgeon: Jettie Booze, MD;  Location: [facility] CATH LAB;  Service: Cardiovascular;  Laterality: N/A;   Social History   Occupational History  . Not on file  Tobacco Use  . Smoking status: Former Smoker    Packs/day: 1.00    Years: 12.00    Pack years: 12.00    Types: Cigarettes    Start date: 10/28/1949    Quit date: 10/28/1962    Years since quitting: 56.8  . Smokeless tobacco: Never Used  Substance and Sexual Activity  . Alcohol use: No  . Drug use: No  . Sexual activity: Not on file

## 2019-08-16 NOTE — Progress Notes (Signed)
 .  Numeric Pain Rating Scale and Functional Assessment Average Pain 8   In the last MONTH (on 0-10 scale) has pain interfered with the following?  1. General activity like being  able to carry out your everyday physical activities such as walking, climbing stairs, carrying groceries, or moving a chair?  Rating(8)   +Driver, +BT(plavix, ok for inj), -Dye Allergies.

## 2019-08-16 NOTE — Progress Notes (Signed)
Reviewed and agree with documentation and assessment and plan. K. Veena Kinzlee Selvy , MD   

## 2019-08-16 NOTE — Telephone Encounter (Signed)
64493 Injection(s), diagnostic or therapeutic more  Notification/Prior Authorization not required if procedure performed in Office; otherwise may be required for this service.  64483 Injection(s), anesthetic agent and/or st more  Notification/Prior Authorization not required if procedure performed in Office; otherwise may be required for this service.

## 2019-08-18 ENCOUNTER — Encounter: Payer: Self-pay | Admitting: Physical Medicine and Rehabilitation

## 2019-08-18 DIAGNOSIS — M7061 Trochanteric bursitis, right hip: Secondary | ICD-10-CM

## 2019-08-18 MED ORDER — LIDOCAINE HCL 2 % IJ SOLN
4.0000 mL | INTRAMUSCULAR | Status: AC | PRN
  Administered 2019-08-18: 4 mL

## 2019-08-18 MED ORDER — TRIAMCINOLONE ACETONIDE 40 MG/ML IJ SUSP
80.0000 mg | INTRAMUSCULAR | Status: AC | PRN
  Administered 2019-08-18: 80 mg via INTRA_ARTICULAR

## 2019-08-18 MED ORDER — BUPIVACAINE HCL 0.25 % IJ SOLN
4.0000 mL | INTRAMUSCULAR | Status: AC | PRN
  Administered 2019-08-18: 4 mL via INTRA_ARTICULAR

## 2019-08-18 NOTE — Progress Notes (Signed)
Sheryl Curtis - 85 y.o. female MRN [id number]  Date of birth: 1932/02/19  Office Visit Note: Visit Date: 05/05/2019 PCP: Alroy Dust, L.Marlou Sa, MD Referred by: Girtha Rm, NP-C  Subjective: Chief Complaint  Patient presents with   Lower Back - Pain   Right Thigh - Pain   Neck - Pain   HPI: Sheryl Curtis is a 85 y.o. female who comes in today For evaluation and management of low back pain and thigh pain.  This is a diagnosis and problem that we have treated with epidural injection with good relief in 2018.  She has pretty significant stenosis.  She also is complaining today of left-sided neck pain and occiput pain.  No real pain radiating over the head.  Pain with rotating to the left.  She has had no prior work-up at least recently of her cervical spine.  No cervical MRI.  She does not have pain down the arms although she has shoulder pain arm pain and hand pain.  She does suffer from fibromyalgia.  She has multiple drug intolerances to most medications treated for depression anxiety and nerve type pain.  She is followed from an orthopedic standpoint by Dr. Ninfa Linden in our office.  She has not had any trauma or falls recently.  No red flag complaints.  Cervical spine x-rays were looked at.  Review of Systems  Constitutional: Positive for malaise/fatigue. Negative for chills, fever and weight loss.  HENT: Negative for hearing loss and sinus pain.   Eyes: Negative for blurred vision, double vision and photophobia.  Respiratory: Negative for cough and shortness of breath.   Cardiovascular: Negative for chest pain, palpitations and leg swelling.  Gastrointestinal: Negative for abdominal pain, nausea and vomiting.  Genitourinary: Negative for flank pain.  Musculoskeletal: Positive for back pain, joint pain and neck pain. Negative for myalgias.  Skin: Negative for itching and rash.  Neurological: Positive for headaches. Negative for tremors, focal weakness and weakness.    Endo/Heme/Allergies: Negative.   Psychiatric/Behavioral: Negative for depression.  All other systems reviewed and are negative.  Otherwise per HPI.  Assessment & Plan: Visit Diagnoses:  1. Neck pain   2. Cervical spondylosis without myelopathy   3. Chronic pain of both shoulders   4. Spondylosis without myelopathy or radiculopathy, lumbar region   5. Spinal stenosis of lumbar region without neurogenic claudication   6. Fibromyalgia   7. Chronic pain syndrome     Plan: Findings:  Chronic worsening mostly axial low back pain no real referral down the legs this is a situation that is a combined issue with multifactorial stenosis at L4-5 and multilevel facet arthropathy.  Epidural injection in the past helped her out quite a bit from a leg pain standpoint but she is not really having that now.  I think the best approach is diagnostic facet joint blocks with a goal towards radiofrequency ablation for more definitive care.  She is 85 years old and does not want any surgical intervention.  She suffers from fibromyalgia which is quite significant and she just has really all over body pain.  She does have allover osteoarthritis as well so it is a mixed issue in terms of her pain complaints.  She has failed other manner of conservative care.  She will continue to follow with Dr. Ninfa Linden for her orthopedic complaints.  In terms of her neck pain I think she is having pain from the upper cervical facet joints on the left side.  We discussed cervical facet  blocks and looking at radiofrequency ablation and she is not ready to really proceed with anything like that at this point.  She does not really have much in the way of trigger points although she has tight muscles and tender points.  She could have some level of stenosis in the cervical spine but she has no radicular complaints even though she does have shoulder arm and hand pain I think these are more issues with the fibromyalgia and arthritis but I  cannot totally rule out radicular pain.  Would look at cervical MRI for symptoms worsen or become more radicular.  Still consider cervical facet blocks at some point particularly on the left.    Meds & Orders: No orders of the defined types were placed in this encounter.   Orders Placed This Encounter  Procedures   XR Cervical Spine 2 or 3 views    Follow-up: Return for Lumbar facet joint blocks.   Procedures: No procedures performed  No notes on file   Clinical History: MRI LUMBAR SPINE WITHOUT CONTRAST  TECHNIQUE: Multiplanar, multisequence MR imaging of the lumbar spine was performed. No intravenous contrast was administered.  COMPARISON:  MRI lumbar spine July 01, 2015  FINDINGS: SEGMENTATION: For the purposes of this report, the last well-formed intervertebral disc will be described as L5-S1.  ALIGNMENT: Maintenance of the lumbar lordosis. Minimal grade 1 L5-S1 retrolisthesis without spondylolysis.  VERTEBRAE:Vertebral bodies are intact. Similar severe L3-4, L4-5 and L5-S1 disc height loss, associated with levoscoliosis. Severe T11-12 and T12-L1 disc height loss associated with dextroscoliosis. Decreased T2 signal within all disc compatible with desiccation with multilevel vacuum disc. Moderate lower lumbar subacute on chronic discogenic endplate changes. No bone marrow signal abnormality to suggest acute osseous process.  CONUS MEDULLARIS: Conus medullaris terminates at T12-L1 and demonstrates normal morphology and signal characteristics. Similar 4 mm low signal nodule RIGHT thecal sac at L4 best seen on sagittal T2 10/17. Cauda equina is normal.  PARASPINAL AND SOFT TISSUES: Included prevertebral and paraspinal soft tissues are nonacute. Moderate to severe symmetric paraspinal muscle atrophy. Stable renal cysts measuring up to 15 mm on the RIGHT.  DISC LEVELS:  L1-2: Stable small broad-based disc bulge asymmetric to LEFT. Mild facet arthropathy  and ligamentum flavum redundancy without canal stenosis. Minimal LEFT neural foraminal narrowing.  L2-3: Stable small broad-based disc bulge. Mild facet arthropathy and ligamentum flavum redundancy without canal stenosis. Mild to moderate LEFT neural foraminal narrowing.  L3-4: Small broad-based disc bulge, moderate RIGHT subarticular disc protrusion. Mild facet arthropathy and ligamentum flavum redundancy. No canal stenosis though partial chronic effacement RIGHT lateral recess could affect the traversing RIGHT L4 nerve. Moderate to severe RIGHT, mild LEFT neural foraminal narrowing.  L4-5: Stable moderate broad-based disc bulge asymmetric to the RIGHT. Moderate RIGHT and mild LEFT facet arthropathy with ligamentum flavum redundancy. No canal stenosis. Severe RIGHT, moderate LEFT neural foraminal narrowing.  L5-S1: Moderate broad-based disc bulge. Mild to moderate facet arthropathy and ligamentum flavum redundancy without canal stenosis. Moderate to severe RIGHT, severe LEFT neural foraminal narrowing.  IMPRESSION: Stable degenerative change of the lumbar spine without fracture or malalignment.  No canal stenosis. Neural foraminal narrowing at all lumbar levels: Severe on the RIGHT at L4-5 and severe on the LEFT at L5-S1.  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   Electronically Signed   By: Elon Alas M.D.   On: 08/22/2016 03:14   She reports that she quit smoking about 56 years ago. Her smoking  use included cigarettes. She started smoking about 69 years ago. She has a 12.00 pack-year smoking history. She has never used smokeless tobacco. No results for input(s): HGBA1C, LABURIC in the last 8760 hours.  Objective:  VS:  HT:5' (152.4 cm)    WT:200 lb (90.7 kg)   BMI:39.06     BP:(!) 158/63   HR:62bpm   TEMP: ( )   RESP:  Physical Exam Vitals signs and nursing note reviewed.  Constitutional:      General: She is not in acute  distress.    Appearance: Normal appearance. She is well-developed.  HENT:     Head: Normocephalic and atraumatic.     Nose: Nose normal.     Mouth/Throat:     Mouth: Mucous membranes are moist.     Pharynx: Oropharynx is clear.  Eyes:     Conjunctiva/sclera: Conjunctivae normal.     Pupils: Pupils are equal, round, and reactive to light.  Neck:     Musculoskeletal: Neck supple. Muscular tenderness present.  Cardiovascular:     Rate and Rhythm: Regular rhythm.  Pulmonary:     Effort: Pulmonary effort is normal. No respiratory distress.  Abdominal:     General: There is no distension.     Palpations: Abdomen is soft.     Tenderness: There is no guarding.  Musculoskeletal:     Right lower leg: No edema.     Left lower leg: No edema.     Comments: Cervical spine exam shows limited range of motion with left rotation compared to right and she does have pain with extension but negative Spurling's test bilaterally.  She has shoulder impingement signs bilaterally.  She has full strength in the upper extremities with good wrist flexion and extension strength as well as long finger flexion and APB and abduction.  She has negative Tinel's at the wrist.  She has a negative Hoffman's test.  Lumbar exam shows patient has difficulty rising from seated position she does have pain with facet joint loading.  She has some pain over the trochanters and PSIS with tender points.  No pain with hip rotation she has good distal strength.  Lymphadenopathy:     Cervical: No cervical adenopathy.  Skin:    General: Skin is warm and dry.     Findings: No erythema or rash.  Neurological:     General: No focal deficit present.     Mental Status: She is alert and oriented to person, place, and time.     Motor: No abnormal muscle tone.     Coordination: Coordination normal.     Gait: Gait normal.  Psychiatric:        Mood and Affect: Mood normal.        Behavior: Behavior normal.        Thought Content: Thought  content normal.     Ortho Exam Imaging: AP and lateral cervical spine dated today:AP and lateral cervical spine shows normal AP alignment with significant degenerative facet arthritis of the upper cervical facets at C2-3 and C3-4 and somewhat at 4-5.  Lateral view shows reversal of lordosis at C5-6 with degenerative disc height loss at C5-6 with foraminal narrowing.  Past Medical/Family/Surgical/Social History: Medications & Allergies reviewed per EMR, new medications updated. Patient Active Problem List   Diagnosis Date Noted   Dysphagia 06/14/2019   Neck pain, chronic 04/21/2019   Physical deconditioning 12/09/2018   CKD (chronic kidney disease)    Morbid obesity (HCC)    Mild persistent  asthma with (acute) exacerbation 07/16/2018   Cough 07/16/2018   OSA (obstructive sleep apnea) 07/30/2017   Trochanteric bursitis, right hip 04/21/2017   Nocturnal hypoxia 03/31/2017   Sinus bradycardia 08/05/2016   Hypertensive urgency 08/03/2016   Asthma 02/19/2016   Chronic kidney disease (CKD), stage III (moderate) 07/05/2015   CAFL (chronic airflow limitation) (HCC) 07/05/2015   Generalized OA 07/05/2015   Detrusor muscle hypertonia 07/05/2015   H/O malignant neoplasm of colon 07/05/2015   Elevated troponin 07/02/2015   UTI (lower urinary tract infection) 07/01/2015   Fibromyalgia syndrome 07/01/2015   Hyponatremia 07/01/2015   Urinary tract infectious disease    Pulmonary nodules/lesions, multiple 04/11/2014   Abdominal pain, generalized 04/07/2014   Coronary atherosclerosis of native coronary artery 10/12/2013   Essential hypertension, benign    Other and unspecified angina pectoris    Atrial fibrillation ([place]) 04/19/2010   Allergic rhinitis 04/19/2010   DYSPNEA 04/19/2010   CHEST PAIN, ATYPICAL 04/19/2010   CANCER, COLORECTAL 04/18/2010   Essential hypertension 04/18/2010   G E R D 04/18/2010   Fibromyalgia 04/18/2010   INSOMNIA 04/18/2010    Past Medical History:  Diagnosis Date   ALLERGIC RHINITIS    Atrial fibrillation ([place])    CANCER, COLORECTAL    CHEST PAIN, ATYPICAL    CKD (chronic kidney disease)    DYSPNEA    Essential hypertension, benign    FIBROMYALGIA    G E R D    History of stress test    Myoview 8/16:  EF 69%, anterior and apical defect c/w breast attenuation; Low Risk    INSOMNIA    Morbid obesity ([place])    Other and unspecified angina pectoris    Sleep apnea    Family History  Problem Relation Age of Onset   CAD Brother    Heart attack Brother 7   Bone cancer Sister    Throat cancer Daughter    Past Surgical History:  Procedure Laterality Date   ABDOMINAL HYSTERECTOMY     BACK SURGERY     BREAST SURGERY     COLON SURGERY     CORONARY STENT PLACEMENT     PERCUTANEOUS CORONARY STENT INTERVENTION (PCI-S) N/A 05/19/2013   Procedure: PERCUTANEOUS CORONARY STENT INTERVENTION (PCI-S);  Surgeon: Jettie Booze, MD;  Location: [facility] CATH LAB;  Service: Cardiovascular;  Laterality: N/A;   Social History   Occupational History   Not on file  Tobacco Use   Smoking status: Former Smoker    Packs/day: 1.00    Years: 12.00    Pack years: 12.00    Types: Cigarettes    Start date: 10/28/1949    Quit date: 10/28/1962    Years since quitting: 56.8   Smokeless tobacco: Never Used  Substance and Sexual Activity   Alcohol use: No   Drug use: No   Sexual activity: Not on file

## 2019-09-06 ENCOUNTER — Encounter: Payer: Self-pay | Admitting: Pulmonary Disease

## 2019-09-06 ENCOUNTER — Other Ambulatory Visit: Payer: Self-pay

## 2019-09-06 ENCOUNTER — Ambulatory Visit: Payer: Medicare Other | Admitting: Pulmonary Disease

## 2019-09-06 DIAGNOSIS — J4530 Mild persistent asthma, uncomplicated: Secondary | ICD-10-CM | POA: Diagnosis not present

## 2019-09-06 DIAGNOSIS — M797 Fibromyalgia: Secondary | ICD-10-CM

## 2019-09-06 MED ORDER — BUDESONIDE-FORMOTEROL FUMARATE 160-4.5 MCG/ACT IN AERO: 2.0000 | INHALATION_SPRAY | Freq: Two times a day (BID) | RESPIRATORY_TRACT | 0 refills | Status: DC

## 2019-09-06 MED ORDER — TRAMADOL HCL 50 MG PO TABS: 50.0000 mg | ORAL_TABLET | Freq: Two times a day (BID) | ORAL | 0 refills | Status: DC | PRN

## 2019-09-06 NOTE — Patient Instructions (Signed)
Tramadol twice daily as needed for pain. Discuss with Dr. Ninfa Linden  & PCP about pain medications  Okay to take albuterol nebs 3 times daily as needed

## 2019-09-06 NOTE — Assessment & Plan Note (Addendum)
Unable to pinpoint if her pain today is related to fibromyalgia or worsening arthritis neck, shoulders and both hips.  This seems to be debilitating and really affecting her quality of life.  Seems like she has seen Dr. Ernestina Patches of physical medicine and there is nothing else they can do.  She has an appointment with an orthopedic surgeon Dr. Ninfa Linden next week and if there is indeed nothing else that can be done then I think she needs to have a discussion with PCP about quality of life versus being in chronic pain I am giving her prescription for tramadol 50 twice daily for 20 pills until she can get to these appointments but again I think this is becoming a quality of life issue for her I clarified with her that tramadol is not an allergy and she has taken this before

## 2019-09-06 NOTE — Assessment & Plan Note (Signed)
She does not feel like Symbicort is helping and is stopped taking. Continue albuterol nebs 3 times daily and as needed

## 2019-09-06 NOTE — Progress Notes (Signed)
   Subjective:    Patient ID: Sheryl Curtis, female    DOB: December 10, 1931, 85 y.o.   MRN: [id number]  HPI  87 yoremote smoker with severe RA,for FU ofairwayObstruction& OSA She has severe rheumatoid arthritis with previous MTX use and fibromyalgia CT chest has shown multiple pulmonary nodules that have been stable,last 2018  PMH - rectal cancer status post resection in 2007, stent to LAD in 04/2013. on Lasix daily for bipedal edema.  Chief Complaint  Patient presents with  . Follow-up    Patient reports that she still has sob with any exertion.    She complains of severe pain in her neck left shoulder radiating to her arm and in both hips.  She arrives in a wheelchair today accompanied by her daughter She has seen Dr. Ernestina Patches from physical medicine and she not a candidate for more injections.  For some reason she states that her PCP will not give her pain medications.  But daughter states that she is hurting all day and her quality of life is very poor and she does not have a history of "abusing medications".  Breathing has been stable and mostly worsened by pain, her activity has been limited by pain.  She does not feel that Symbicort is helping her at all and takes albuterol nebs twice daily  07/2019 choking spell >> GI -reviewed.  She refuses flu shot since this has made her sick in the past    Significant tests/ events reviewed  CTchest 03/2017 -stable multiple pulmonary nodules since 2015  Spirometry in 05/2010 showed FEV1 of 76% improvement postbronchodilator to 80%-1.60 with a ratio of 75 suggesting mild restriction. Spirometry 03/2014 FEV1 of 1.25-64%, with ratio 65 and FVC of 1.91-72%.  PFTs - 12/2014 - no airway obstruction, ratio 71, pos BD response PFTs 11/2018, and mild airway obstruction with FEV1 79%, TLC 101%, DLCO normal, no bronchodilator response  06/2015 Admit for UTI and Sepsis   NPSG >>AHI 10/h corrected by CPAP 10 cm  Echo 06/2018>normal LV  function without any evidence of diastolic dysfunction.   Review of Systems neg for any significant sore throat, dysphagia, itching, sneezing, nasal congestion or excess/ purulent secretions, fever, chills, sweats, unintended wt loss, pleuritic or exertional cp, hempoptysis, orthopnea pnd or change in chronic leg swelling. Also denies presyncope, palpitations, heartburn, abdominal pain, nausea, vomiting, diarrhea or change in bowel or urinary habits, dysuria,hematuria, rash, arthralgias, visual complaints, headache, numbness weakness or ataxia.     Objective:   Physical Exam Gen. Pleasant, obese, in no distress ENT - no lesions, no post nasal drip Neck: No JVD, no thyromegaly, no carotid bruits Lungs: no use of accessory muscles, no dullness to percussion, decreased without rales or rhonchi  Cardiovascular: Rhythm regular, heart sounds  normal, no murmurs or gallops, no peripheral edema Musculoskeletal: No deformities, no cyanosis or clubbing , no tremors        Assessment & Plan:

## 2019-09-07 ENCOUNTER — Encounter: Payer: Medicare Other | Admitting: Physical Medicine and Rehabilitation

## 2019-09-08 ENCOUNTER — Ambulatory Visit: Payer: Medicare Other | Admitting: Orthopaedic Surgery

## 2019-09-15 ENCOUNTER — Ambulatory Visit: Payer: Self-pay

## 2019-09-15 ENCOUNTER — Ambulatory Visit: Payer: Medicare Other | Admitting: Physician Assistant

## 2019-09-15 ENCOUNTER — Encounter: Payer: Self-pay | Admitting: Physician Assistant

## 2019-09-15 ENCOUNTER — Other Ambulatory Visit: Payer: Self-pay

## 2019-09-15 DIAGNOSIS — M25512 Pain in left shoulder: Secondary | ICD-10-CM | POA: Diagnosis not present

## 2019-09-15 MED ORDER — TRAMADOL HCL 50 MG PO TABS: 50.0000 mg | ORAL_TABLET | Freq: Four times a day (QID) | ORAL | 0 refills | Status: DC | PRN

## 2019-09-15 NOTE — Progress Notes (Signed)
Office Visit Note   Patient: Sheryl Curtis           Date of Birth: 01-20-1932           MRN: [id number] Visit Date: 09/15/2019              Requested by: Sheryl Curtis, L.Sheryl Curtis, [place] [place],  [place] [id number] PCP: Sheryl Curtis, L.Sheryl Sa, MD   Assessment & Plan: Visit Diagnoses:  1. Acute pain of left shoulder     Plan:  Due to patient's continued neck pain recommend that she follow back up with Sheryl Curtis for possible cervical facet joint injections versus radiofrequency ablation.  I agree do not feel that she is having any true radicular symptoms down either arm related to her neck. Regards shoulder we will see how she does with the subacromial injection.  If she continues to have pain or if pain relief is short-lived may obtain further imaging to better image his shoulder to rule out any arthropathy. As to her chronic pain multiple joints and fibromyalgia will refer to pain management.  Questions encouraged and answered at length.  Follow-Up Instructions: No follow-ups on file.   Orders:  Orders Placed This Encounter  Procedures  . XR Shoulder Left   Meds ordered this encounter  Medications  . traMADol (ULTRAM) 50 MG tablet    Sig: Take 1 tablet (50 mg total) by mouth every 6 (six) hours as needed for moderate pain.    Dispense:  30 tablet    Refill:  0      Procedures: No procedures performed   Clinical Data: No additional findings.   Subjective: No chief complaint on file.   HPI Sheryl Curtis comes in today due to bilateral shoulder pain left greater than right.  She has some pain in her fingers both hands but no true radicular symptoms down either arm.  She continues to have some neck pain.  She had recent cervical spine films on 05/05/2019 which showed significant degenerative facet arthritis at C2-3 and C 3- 4.  She has had no new injury to her neck.  States that her whole body just hurts all over.  She is wanting someone to manage her pain.  She  does have fibromyalgia.  Recently she had her pulmonologist give her some pain medicine this is tramadol which is listed as an allergy.  She states she is tolerating this currently.  She is taking 1 tablet every 12 hours. Review of Systems Negative for fevers or chills.  Objective: Vital Signs: There were no vitals taken for this visit.  Physical Exam Constitutional:      Appearance: She is not ill-appearing or diaphoretic.  Pulmonary:     Effort: Pulmonary effort is normal.  Neurological:     Mental Status: She is oriented to person, place, and time.  Psychiatric:        Behavior: Behavior normal.     Ortho Exam Shoulder she has 5 out of 5 strength with external/internal rotation against resistance however this painful particularly on the left.  Impingement testing positive on the left negative on the right.  Empty can test negative bilaterally but again painful for her to resist.  5 out of 5 strength triceps biceps against resistance bilaterally.  Sensation grossly intact bilateral hands light touch.  Full motor bilateral hands.  Cervical spine negative Spurling's test bilaterally.  She has full flexion-extension cervical spine lateral gazing to the right and left causing her discomfort.  Specialty Comments:  No specialty comments available.  Imaging: Xr Shoulder Left  Result Date: 09/15/2019 Left shoulder 3 views: AP view shows normal-appearing humeral head without evidence of bony abnormalities or fracture.  Y view shows the humeral head well located.  Axillary view unable to assess the glenohumeral joint due to underpenetration.    PMFS History: Patient Active Problem List   Diagnosis Date Noted  . Dysphagia 06/14/2019  . Neck pain, chronic 04/21/2019  . Physical deconditioning 12/09/2018  . CKD (chronic kidney disease)   . Morbid obesity ([place])   . Mild persistent asthma with (acute) exacerbation 07/16/2018  . Cough 07/16/2018  . OSA (obstructive sleep apnea) 07/30/2017   . Trochanteric bursitis, right hip 04/21/2017  . Nocturnal hypoxia 03/31/2017  . Sinus bradycardia 08/05/2016  . Hypertensive urgency 08/03/2016  . Asthma 02/19/2016  . Chronic kidney disease (CKD), stage III (moderate) 07/05/2015  . CAFL (chronic airflow limitation) ([place]) 07/05/2015  . Generalized OA 07/05/2015  . 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 H/O malignant neoplasm of colon 07/05/2015  . Elevated troponin 07/02/2015  . UTI (lower urinary tract infection) 07/01/2015  . Fibromyalgia syndrome 07/01/2015  . Hyponatremia 07/01/2015  . Urinary tract infectious disease   . Pulmonary nodules/lesions, multiple 04/11/2014  . Abdominal pain, generalized 04/07/2014  . Coronary atherosclerosis of native coronary artery 10/12/2013  . Essential hypertension, benign   . Other and unspecified angina pectoris   . Atrial fibrillation ([place]) 04/19/2010  . Allergic rhinitis 04/19/2010  . DYSPNEA 04/19/2010  . CHEST PAIN, ATYPICAL 04/19/2010  . CANCER, COLORECTAL 04/18/2010  . Essential hypertension 04/18/2010  . G E R D 04/18/2010  . Fibromyalgia 04/18/2010  . INSOMNIA 04/18/2010   Past Medical History:  Diagnosis Date  . ALLERGIC RHINITIS   . Atrial fibrillation ([place])   . CANCER, COLORECTAL   . CHEST PAIN, ATYPICAL   . CKD (chronic kidney disease)   . DYSPNEA   . Essential hypertension, benign   . FIBROMYALGIA   . G E R D   . History of stress test    Myoview 8/16:  EF 69%, anterior and apical defect c/w breast attenuation; Low Risk   . INSOMNIA   . Morbid obesity ([place])   . Other and unspecified angina pectoris   . Sleep apnea     Family History  Problem Relation Age of Onset  . CAD Brother   . Heart attack Brother 39  . Bone cancer Sister   . Throat cancer Daughter     Past Surgical History:  Procedure Laterality Date  . ABDOMINAL HYSTERECTOMY    . BACK SURGERY    . BREAST SURGERY    . COLON SURGERY    . CORONARY STENT PLACEMENT    . PERCUTANEOUS CORONARY STENT  INTERVENTION (PCI-S) N/A 05/19/2013   Procedure: PERCUTANEOUS CORONARY STENT INTERVENTION (PCI-S);  Surgeon: Jettie Booze, MD;  Location[facility] CATH LAB;  Service: Cardiovascular;  Laterality: N/A;   Social History   Occupational History  . Not on file  Tobacco Use  . Smoking status: Former Smoker    Packs/day: 1.00    Years: 12.00    Pack years: 12.00    Types: Cigarettes    Start date: 10/28/1949    Quit date: 10/28/1962    Years since quitting: 56.9  . Smokeless tobacco: Never Used  Substance and Sexual Activity  . Alcohol use: No  . Drug use: No  . Sexual activity: Not on file

## 2019-09-16 ENCOUNTER — Other Ambulatory Visit: Payer: Self-pay | Admitting: Radiology

## 2019-09-16 DIAGNOSIS — M25512 Pain in left shoulder: Secondary | ICD-10-CM

## 2019-09-16 DIAGNOSIS — M797 Fibromyalgia: Secondary | ICD-10-CM

## 2019-09-17 DIAGNOSIS — H903 Sensorineural hearing loss, bilateral: Secondary | ICD-10-CM | POA: Insufficient documentation

## 2019-09-20 ENCOUNTER — Other Ambulatory Visit: Payer: Self-pay | Admitting: Radiology

## 2019-09-20 DIAGNOSIS — M542 Cervicalgia: Secondary | ICD-10-CM

## 2019-09-28 ENCOUNTER — Other Ambulatory Visit: Payer: Self-pay | Admitting: General Surgery

## 2019-09-28 DIAGNOSIS — J4530 Mild persistent asthma, uncomplicated: Secondary | ICD-10-CM

## 2019-09-28 MED ORDER — ALBUTEROL SULFATE (2.5 MG/3ML) 0.083% IN NEBU: INHALATION_SOLUTION | RESPIRATORY_TRACT | 1 refills | Status: DC

## 2019-10-04 ENCOUNTER — Telehealth: Payer: Self-pay | Admitting: Radiology

## 2019-10-04 NOTE — Telephone Encounter (Signed)
Patient is ready to schedule her appt that she needed, please call her to sched appt.

## 2019-10-04 NOTE — Telephone Encounter (Signed)
Send to [place]

## 2019-10-05 ENCOUNTER — Telehealth: Payer: Self-pay | Admitting: Orthopaedic Surgery

## 2019-10-05 NOTE — Telephone Encounter (Signed)
Rx refill Tramadol [facility] @ [place]

## 2019-10-05 NOTE — Telephone Encounter (Signed)
Please advise 

## 2019-10-06 ENCOUNTER — Other Ambulatory Visit: Payer: Self-pay | Admitting: Physician Assistant

## 2019-10-06 MED ORDER — TRAMADOL HCL 50 MG PO TABS: 50.0000 mg | ORAL_TABLET | Freq: Four times a day (QID) | ORAL | 0 refills | Status: DC | PRN

## 2019-10-06 NOTE — Telephone Encounter (Signed)
done

## 2019-10-07 NOTE — Telephone Encounter (Signed)
Sent Brynn Benita Stabile assistant) a message through patient referral.

## 2019-10-18 ENCOUNTER — Other Ambulatory Visit: Payer: Medicare Other

## 2019-10-20 ENCOUNTER — Other Ambulatory Visit: Payer: Self-pay

## 2019-10-20 ENCOUNTER — Encounter
Payer: Medicare Other | Attending: Physical Medicine and Rehabilitation | Admitting: Physical Medicine and Rehabilitation

## 2019-10-20 ENCOUNTER — Encounter: Payer: Self-pay | Admitting: Physical Medicine and Rehabilitation

## 2019-10-20 VITALS — BP 115/66 | HR 87 | Temp 97.8°F | Ht 64.0 in | Wt 208.0 lb

## 2019-10-20 DIAGNOSIS — M797 Fibromyalgia: Secondary | ICD-10-CM | POA: Insufficient documentation

## 2019-10-20 MED ORDER — PREGABALIN 50 MG PO CAPS: 50.0000 mg | ORAL_CAPSULE | Freq: Two times a day (BID) | ORAL | 1 refills | Status: DC

## 2019-10-20 NOTE — Progress Notes (Signed)
Subjective:    Patient ID: Sheryl Curtis, female    DOB: Mar 01, 1932, 85 y.o.   MRN: [id number]  HPI  Sheryl Curtis presents with diffuse pains. She has pains in her bilateral shoulders, neck, bilateral hips, knees, ankles, and low back. She has been diagnosed with fibromyalgia, osteoarthritis, and rheumatoid arthritis. Her quality of life is greatly diminished and she mostly watches TV and uses her ipad. Once in a while she ambulates around her home with assistive device or ambulates outside, but she finds it too cold now and she is in too much pain. She finds the Tramadol does not help her. She believes she was on 50mg  of Lyrica in the past without benefit. Her daughter accompanies her and agrees that her quality of life is greatly diminished due to her pain. She tries to encourage her mother to walk outdoors to exercise.   Pain Inventory Average Pain 6 Pain Right Now 7 My pain is sharp and aching  In the last 24 hours, has pain interfered with the following? General activity 10 Relation with others 10 Enjoyment of life 10 What TIME of day is your pain at its worst? all Sleep (in general) Poor  Pain is worse with: sitting Pain improves with: rest Relief from Meds: 2  Mobility use a walker ability to climb steps?  no do you drive?  no  Function retired  Neuro/Psych No problems in this area  Prior Studies new  Physicians involved in your care new   Family History  Problem Relation Age of Onset  . CAD Brother   . Heart attack Brother 25  . Bone cancer Sister   . Throat cancer Daughter    Social History   Socioeconomic History  . Marital status: Widowed    Spouse name: Not on file  . Number of children: 4  . Years of education: Not on file  . Highest education level: Not on file  Occupational History  . Not on file  Tobacco Use  . Smoking status: Former Smoker    Packs/day: 1.00    Years: 12.00    Pack years: 12.00    Types: Cigarettes    Start date:  10/28/1949    Quit date: 10/28/1962    Years since quitting: 57.0  . Smokeless tobacco: Never Used  Substance and Sexual Activity  . Alcohol use: No  . Drug use: No  . Sexual activity: Not on file  Other Topics Concern  . Not on file  Social History Narrative  . Not on file   Social Determinants of Health   Financial Resource Strain: Low Risk   . Difficulty of Paying Living Expenses: Not hard at all  Food Insecurity: No Food Insecurity  . Worried About Charity fundraiser in the Last Year: Never true  . Ran Out of Food in the Last Year: Never true  Transportation Needs: No Transportation Needs  . Lack of Transportation (Medical): No  . Lack of Transportation (Non-Medical): No  Physical Activity: Inactive  . Days of Exercise per Week: 0 days  . Minutes of Exercise per Session: 0 min  Stress: No Stress Concern Present  . Feeling of Stress : Only a little  Social Connections: Somewhat Isolated  . Frequency of Communication with Friends and Family: More than three times a week  . Frequency of Social Gatherings with Friends and Family: More than three times a week  . Attends Religious Services: 1 to 4 times per year  .  Active Member of Clubs or Organizations: No  . Attends Archivist Meetings: Never  . Marital Status: Widowed   Past Surgical History:  Procedure Laterality Date  . ABDOMINAL HYSTERECTOMY    . BACK SURGERY    . BREAST SURGERY    . COLON SURGERY    . CORONARY STENT PLACEMENT    . PERCUTANEOUS CORONARY STENT INTERVENTION (PCI-S) N/A 05/19/2013   Procedure: PERCUTANEOUS CORONARY STENT INTERVENTION (PCI-S);  Surgeon: Jettie Booze, MD;  Location: [facility] CATH LAB;  Service: Cardiovascular;  Laterality: N/A;   Past Medical History:  Diagnosis Date  . ALLERGIC RHINITIS   . Atrial fibrillation ([place])   . CANCER, COLORECTAL   . CHEST PAIN, ATYPICAL   . CKD (chronic kidney disease)   . DYSPNEA   . Essential hypertension, benign   . FIBROMYALGIA   . G E R D    . History of stress test    Myoview 8/16:  EF 69%, anterior and apical defect c/w breast attenuation; Low Risk   . INSOMNIA   . Morbid obesity ([place])   . Other and unspecified angina pectoris   . Sleep apnea    BP 115/66   Pulse 87   Temp 97.8 F (36.6 C)   Ht 5\' 4"  (1.626 m)   Wt 208 lb (94.3 kg)   SpO2 94%   BMI 35.70 kg/m   Opioid Risk Score:   Fall Risk Score:  `1  Depression screen PHQ 2/9  Depression screen [facility] 2/9 07/22/2019 04/21/2019 01/13/2019 01/04/2019 10/29/2018 08/28/2015 06/21/2013  Decreased Interest 0 0 1 0 0 0 0  Down, Depressed, Hopeless 0 0 1 0 0 0 1  PHQ - 2 Score 0 0 2 0 0 0 1  Altered sleeping - - 1 - - - -  Tired, decreased energy - - 1 - - - -  Change in appetite - - 0 - - - -  Feeling bad or failure about yourself  - - 1 - - - -  Trouble concentrating - - 1 - - - -  Moving slowly or fidgety/restless - - 0 - - - -  Suicidal thoughts - - 0 - - - -  PHQ-9 Score - - 6 - - - -  Difficult doing work/chores - - Somewhat difficult - - - -  Some recent data might be hidden     Review of Systems  Constitutional: Positive for unexpected weight change.  HENT: Negative.   Eyes: Negative.   Respiratory: Positive for apnea, shortness of breath and wheezing.   Cardiovascular: Negative.   Gastrointestinal: Positive for constipation.  Endocrine: Negative.   Genitourinary: Negative.   Musculoskeletal: Positive for arthralgias, back pain and gait problem.  Skin: Positive for rash.  Allergic/Immunologic: Negative.   Hematological: Negative.   Psychiatric/Behavioral: Negative.   All other systems reviewed and are negative.      Objective:   Physical Exam Gen: no distress, normal appearing HEENT: oral mucosa pink and moist, NCAT Cardio: Reg rate Chest: normal effort, normal rate of breathing Abd: soft, non-distended Ext: no edema Skin: intact Neuro: AOx3 Musculoskeletal: Diffusely weak without focal deficits. FROM of lumbar spine in flexion and  extension with pain on extension in lower lumbar spine. Tender points along upper back and neck muscles. Lateral rotation limited to 25 degrees bilaterally with pain at end range. Protracted posture.  Psych: depressed mood       Assessment & Plan:  Sheryl Curtis presents with diffuse  pains. She has pains in her bilateral shoulders, neck, bilateral hips, knees, ankles, and low back.   1) Fibromyalgia: --Recommend restarting Lyrica and uptitrating to effective dose. Educated that this is one of the most beneficial medications for fibromyalgia patients. Started dose of 50mg  BID since she tolerated 50mg  in the past.  --Discussed that out goal will be to provide pain relief so we can increase her mobility, as exercise is a crucial component of fibromyalgia treatment.   Thirty minutes of face to face patient care time were spent during this visit. All questions were encouraged and answered. Follow up with me in 4 weeks.

## 2019-10-25 ENCOUNTER — Other Ambulatory Visit: Payer: Self-pay | Admitting: *Deleted

## 2019-10-25 NOTE — Patient Outreach (Signed)
[place] [facility]) Care Management  [place]  10/25/2019   Sheryl Curtis 1932-08-28 [id number]  RN Health Coach telephone call to patient.  Hipaa compliance verified. Per Patient she had a recent fall.n Patient has  a lot of pain in her shoulders down to her elbows. Per patient she got out of bed fast going to the bathroom and over balanced fell and hit rocker. Per patient she has gotten some pain medication and that is helping a lot. Per patient she has not been checking her blood pressure daily. She checks it randomly. Patient stated her face get red and she knows when her blood pressure is elevated. Per patient daughter Sheryl Curtis someone has to stay around the clock. Patient and daughter would like a referral to see if the family caregiver could be paid having to stay around the clock. The patient can not be left alone.  Encounter Medications:  Outpatient Encounter Medications as of 10/25/2019  Medication Sig  . albuterol (PROVENTIL) (2.5 MG/3ML) 0.083% nebulizer solution USE 1 VIAL VIA NEBULIZER EVERY 6 HOURS AS NEEDED  . Albuterol Sulfate (PROAIR RESPICLICK) 123XX123 (90 Base) MCG/ACT AEPB Inhale 1-2 puffs into the lungs every 6 (six) hours as needed (for shortness of breath and/or wheezing.).  Marland Kitchen Ascorbic Acid (VITAMIN C) 1000 MG tablet Take 1,000 mg by mouth daily.  Marland Kitchen atorvastatin (LIPITOR) 40 MG tablet TAKE 1 TABLET(40 MG) BY MOUTH DAILY  . budesonide-formoterol (SYMBICORT) 160-4.5 MCG/ACT inhaler Inhale 2 puffs into the lungs 2 (two) times daily.  . budesonide-formoterol (SYMBICORT) 160-4.5 MCG/ACT inhaler Inhale 2 puffs into the lungs 2 (two) times daily.  . budesonide-formoterol (SYMBICORT) 160-4.5 MCG/ACT inhaler Inhale 2 puffs into the lungs 2 (two) times daily.  Marland Kitchen CALCIUM PO Take 1,200 mg by mouth daily.   . cetirizine (ZYRTEC) 10 MG tablet Take 10 mg by mouth daily.  . clopidogrel (PLAVIX) 75 MG tablet TAKE 1 TABLET(75 MG) BY MOUTH EVERY OTHER DAY  . DULoxetine  (CYMBALTA) 60 MG capsule Take 60 mg by mouth daily.  Marland Kitchen esomeprazole (NEXIUM) 20 MG capsule Take 20 mg by mouth daily at 12 noon.  . furosemide (LASIX) 40 MG tablet Take 1 tablet (40 mg total) by mouth daily.  Marland Kitchen ipratropium (ATROVENT) 0.03 % nasal spray Place 2 sprays into both nostrils every 12 (twelve) hours.  . isosorbide mononitrate (IMDUR) 60 MG 24 hr tablet Take 1 tablet (60 mg total) by mouth daily. (Patient taking differently: Take 30 mg by mouth daily. )  . meclizine (ANTIVERT) 25 MG tablet Take 1 tablet (25 mg total) by mouth 3 (three) times daily as needed for dizziness.  . Nebulizers (COMPRESSOR NEBULIZER) MISC 1 Device by Does not apply route once.  . potassium chloride SA (KLOR-CON) 20 MEQ tablet TAKE 1 TABLET BY MOUTH  DAILY  . pregabalin (LYRICA) 50 MG capsule Take 1 capsule (50 mg total) by mouth 2 (two) times daily.  Marland Kitchen Respiratory Therapy Supplies (FLUTTER) DEVI Use flutter device 3 times a day  . valsartan-hydrochlorothiazide (DIOVAN-HCT) 160-12.5 MG tablet Take 1 tablet by mouth daily.   No facility-administered encounter medications on file as of 10/25/2019.    Functional Status:  In your present state of health, do you have any difficulty performing the following activities: 10/25/2019 07/22/2019  Hearing? Y Y  Comment - -  Vision? N -  Difficulty concentrating or making decisions? N N  Walking or climbing stairs? Y Y  Comment due to arthritis and pain arthritis and back painn  radiating down leg  Dressing or bathing? N N  Doing errands, shopping? Sheryl Curtis  Comment daughter Sheryl Curtis runs all errands daughter Sheryl Curtis assists  Conservation officer, nature and eating ? N N  Using the Toilet? N N  In the past six months, have you accidently leaked urine? Y Y  Comment - -  Do you have problems with loss of bowel control? N N  Comment - -  Managing your Medications? N N  Managing your Finances? N N  Comment - -  Housekeeping or managing your Housekeeping? Sheryl Curtis helps out  daughters help out  Some recent data might be hidden    Fall/Depression Screening: Fall Risk  10/25/2019 10/20/2019 07/22/2019  Falls in the past year? 1 1 1   Comment - - -  Number falls in past yr: 1 0 0  Injury with Fall? 1 1 1   Comment hurt shoulder - -  Risk Factor Category  - - -  Risk for fall due to : History of fall(s);Impaired balance/gait;Impaired mobility History of fall(s);Impaired balance/gait;Impaired mobility History of fall(s);Impaired balance/gait;Impaired mobility  Risk for fall due to: Comment - - -  Follow up Falls evaluation completed;Education provided;Falls prevention discussed Falls prevention discussed Falls evaluation completed;Education provided  Comment - - -   PHQ 2/9 Scores 10/25/2019 07/22/2019 04/21/2019 01/13/2019 01/04/2019 10/29/2018 08/28/2015  PHQ - 2 Score 0 0 0 2 0 0 0  PHQ- 9 Score - - - 6 - - -  Exception Documentation - - - Patient refusal - - -   THN CM Care Plan Problem One     Most Recent Value  Care Plan Problem One  Knowledge deficit in self management of hypertension  Role Documenting the Problem One  Lesslie for Problem One  Active  THN Long Term Goal   Patient will have a bettter understanding of foods low in sodium in  90 days  THN Long Term Goal Start Date  10/25/19  Interventions for Problem One Long Term Goal  RN reiterated eating sodium diet. RN reiterated eating healthy foods such as frozen verdsus canned. RN will follow up with further discussion  THN CM Short Term Goal #1   Patient will not have any falls within the next 30 days  THN CM Short Term Goal #1 Start Date  10/25/19  Interventions for Short Term Goal #1  Patient had another fall. RN reiterated falls prevention. RN discussed getting up slowly. RN will follow up with further discussion  THN CM Short Term Goal #2   Patient will verbalize checking blood pressure at least 3x week within the next 30 days  THN CM Short Term Goal #2 Start Date  10/25/19   Interventions for Short Term Goal #2  RN reiterated checking blood pressure. Patient has been waiting until she gets symptomatic like a red face before checking. RN will follow up with further discussion.      Assessment Recent fall Patient checks blood pressure randomly Patient is taking medications as prescribed Patient is eating low sodium die Patient will benefit from [place] telephonic outreach for education and support for hypertension self management.  Plan:  RN discussed monitoring blood pressure Referred to social worker RN reiterated low sodium diet RN reiterated fall precautions RN will follow up within the month of March  Lyrah Bradt [place] Care Management [phone]

## 2019-10-27 ENCOUNTER — Other Ambulatory Visit: Payer: Self-pay

## 2019-10-27 NOTE — Patient Outreach (Signed)
[place] [facility]) Care Management  10/27/2019  YARITZI HAMMER 1931/11/14 [id number]   Social Work referral received from [employer].  "Patient daughter stated that someone has to stay with her mother 24 hrs. She would like to know if there are any programs that will pay the caregiver." Successful outreach to patient today.  Informed patient that only options for caregiver to be financially compensated are private pay or if patient qualifies/is approved for Medicaid.  Talked with patient about Medicaid eligibility and she stated that she is over income limit to qualify.   Patient was appreciative of call.  No other social work needs identified so case is being closed.  Ronn Melena, BSW Social Worker [phone]

## 2019-11-02 ENCOUNTER — Other Ambulatory Visit: Payer: Self-pay

## 2019-11-02 ENCOUNTER — Encounter
Payer: Medicare Other | Attending: Physical Medicine and Rehabilitation | Admitting: Physical Medicine and Rehabilitation

## 2019-11-02 ENCOUNTER — Encounter: Payer: Self-pay | Admitting: Physical Medicine and Rehabilitation

## 2019-11-02 VITALS — Ht 64.0 in | Wt 208.0 lb

## 2019-11-02 DIAGNOSIS — G8929 Other chronic pain: Secondary | ICD-10-CM

## 2019-11-02 DIAGNOSIS — M25512 Pain in left shoulder: Secondary | ICD-10-CM | POA: Diagnosis not present

## 2019-11-02 DIAGNOSIS — M797 Fibromyalgia: Secondary | ICD-10-CM

## 2019-11-02 MED ORDER — PREGABALIN 100 MG PO CAPS: 100.0000 mg | ORAL_CAPSULE | Freq: Two times a day (BID) | ORAL | 1 refills | Status: DC

## 2019-11-02 NOTE — Progress Notes (Signed)
Subjective:    Patient ID: Sheryl Curtis, female    DOB: November 09, 1931, 86 y.o.   MRN: [id number]  HPI  Due to national recommendations of social distancing because of COVID 105, an audio/video tele-health visit is felt to be the most appropriate encounter for this patient at this time. See MyChart message from today for the patient's consent to a tele-health encounter with [name]. This is a follow up tele-visit via phone. The patient is at home. MD is at office.   Sheryl Curtis presents with diffuse pains. She has pains in her bilateral shoulders, neck, bilateral hips, knees, ankles, and low back. She has been diagnosed with fibromyalgia, osteoarthritis, and rheumatoid arthritis. Her quality of life is greatly diminished and she mostly watches TV and uses her ipad. Once in a while she ambulates around her home with assistive device or ambulates outside, but she finds it too cold now and she is in too much pain. She finds the Tramadol does not help her. She believes she was on 50mg  of Lyrica in the past without benefit. Last visit I prescribed 50mg  BID but she did not find relief with this. She did not experience side effects. She asks whether the Lyrica will also help with her OA and RA and notes that her shoulder pain has been worsening.   Pain Inventory Average Pain 10 Pain Right Now 6 My pain is constant and aching  In the last 24 hours, has pain interfered with the following? General activity 10 Relation with others 10 Enjoyment of life 10 What TIME of day is your pain at its worst? all Sleep (in general) Poor  Pain is worse with: all Pain improves with: staying off of feet Relief from Meds: 2  Mobility use a cane use a walker ability to climb steps?  no do you drive?  no  Function I need assistance with the following:  dressing, bathing, meal prep, household duties and shopping  Neuro/Psych weakness trouble walking  Prior Studies  Any changes since last visit?  no  Physicians involved in your care Any changes since last visit?  no   Family History  Problem Relation Age of Onset  . CAD Brother   . Heart attack Brother 32  . Bone cancer Sister   . Throat cancer Daughter    Social History   Socioeconomic History  . Marital status: Widowed    Spouse name: Not on file  . Number of children: 4  . Years of education: Not on file  . Highest education level: Not on file  Occupational History  . Not on file  Tobacco Use  . Smoking status: Former Smoker    Packs/day: 1.00    Years: 12.00    Pack years: 12.00    Types: Cigarettes    Start date: 10/28/1949    Quit date: 10/28/1962    Years since quitting: 57.0  . Smokeless tobacco: Never Used  Substance and Sexual Activity  . Alcohol use: No  . Drug use: No  . Sexual activity: Not on file  Other Topics Concern  . Not on file  Social History Narrative  . Not on file   Social Determinants of Health   Financial Resource Strain: Low Risk   . Difficulty of Paying Living Expenses: Not hard at all  Food Insecurity: No Food Insecurity  . Worried About Charity fundraiser in the Last Year: Never true  . Ran Out of Food  in the Last Year: Never true  Transportation Needs: No Transportation Needs  . Lack of Transportation (Medical): No  . Lack of Transportation (Non-Medical): No  Physical Activity: Inactive  . Days of Exercise per Week: 0 days  . Minutes of Exercise per Session: 0 min  Stress: No Stress Concern Present  . Feeling of Stress : Only a little  Social Connections: Somewhat Isolated  . Frequency of Communication with Friends and Family: More than three times a week  . Frequency of Social Gatherings with Friends and Family: More than three times a week  . Attends Religious Services: 1 to 4 times per year  . Active Member of Clubs or Organizations: No  . Attends Archivist Meetings: Never  . Marital Status: Widowed   Past Surgical  History:  Procedure Laterality Date  . ABDOMINAL HYSTERECTOMY    . BACK SURGERY    . BREAST SURGERY    . COLON SURGERY    . CORONARY STENT PLACEMENT    . PERCUTANEOUS CORONARY STENT INTERVENTION (PCI-S) N/A 05/19/2013   Procedure: PERCUTANEOUS CORONARY STENT INTERVENTION (PCI-S);  Surgeon: Jettie Booze, MD;  Location[facility] CATH LAB;  Service: Cardiovascular;  Laterality: N/A;   Past Medical History:  Diagnosis Date  . ALLERGIC RHINITIS   . Atrial fibrillation ([place])   . CANCER, COLORECTAL   . CHEST PAIN, ATYPICAL   . CKD (chronic kidney disease)   . DYSPNEA   . Essential hypertension, benign   . FIBROMYALGIA   . G E R D   . History of stress test    Myoview 8/16:  EF 69%, anterior and apical defect c/w breast attenuation; Low Risk   . INSOMNIA   . Morbid obesity ([place])   . Other and unspecified angina pectoris   . Sleep apnea    Ht 5\' 4"  (1.626 m) Comment: last recorded  Wt 208 lb (94.3 kg) Comment: last recorded  BMI 35.70 kg/m   Opioid Risk Score:   Fall Risk Score:  `1  Depression screen PHQ 2/9  Depression screen [facility] 2/9 10/25/2019 07/22/2019 04/21/2019 01/13/2019 01/04/2019 10/29/2018 08/28/2015  Decreased Interest 0 0 0 1 0 0 0  Down, Depressed, Hopeless 0 0 0 1 0 0 0  PHQ - 2 Score 0 0 0 2 0 0 0  Altered sleeping - - - 1 - - -  Tired, decreased energy - - - 1 - - -  Change in appetite - - - 0 - - -  Feeling bad or failure about yourself  - - - 1 - - -  Trouble concentrating - - - 1 - - -  Moving slowly or fidgety/restless - - - 0 - - -  Suicidal thoughts - - - 0 - - -  PHQ-9 Score - - - 6 - - -  Difficult doing work/chores - - - Somewhat difficult - - -  Some recent data might be hidden   Review of Systems  Constitutional: Negative.   HENT: Negative.   Eyes: Negative.   Respiratory: Negative.   Cardiovascular: Negative.   Gastrointestinal: Negative.   Endocrine: Negative.   Genitourinary: Negative.   Musculoskeletal: Positive for arthralgias, back pain,  gait problem, myalgias and neck pain.  Skin: Negative.   Allergic/Immunologic: Negative.   Neurological: Positive for weakness.  Hematological: Bruises/bleeds easily.       Plavix  Psychiatric/Behavioral: Negative.   All other systems reviewed and are negative.      Objective:  Physical Exam Not performed given Webex visit.   Assessment & Plan:  Mrs. Amey Samara presents with diffuse pains. She has pains in her bilateral shoulders, neck, bilateral hips, knees, ankles, and low back.   1) Fibromyalgia: --Recommend uptitrating Lyrica to 100mg  BID given continued pain with no medication side effects. --Discussed that out goal will be to provide pain relief so we can increase her mobility, as exercise is a crucial component of fibromyalgia treatment.   2) Chronic left shoulder pain: ordered XR. Can consider steroid injection for pain relief.   3) Rheumatoid arthritis. Patient asks if Lyrica will help with RA and I advise that there are RA specific medications needed to treat this condition. She says she has not followed with a rheumatologist in years. At next visit, we will check RF and ANA levels to assess whether rheumatology referral is warranted.   All questions were encouraged and answered. Follow up with me in 4 weeks.

## 2019-11-04 ENCOUNTER — Telehealth: Payer: Self-pay | Admitting: Physical Medicine and Rehabilitation

## 2019-11-04 NOTE — Telephone Encounter (Signed)
Patient takes Plavix. I called the patient and explained that we will have to get permission from her cardiologist to hold this for 7 days. Message sent to heartcare to get ok to hold Plavix for 7 days.

## 2019-11-04 NOTE — Telephone Encounter (Signed)
Continue Appoints with [facility] for Pain and rehab, can try L4-5 interlam esi. If beneficial for several months 3 to 6 then can repeat.

## 2019-11-04 NOTE — Telephone Encounter (Signed)
   Primary Cardiologist: Larae Grooms, MD  Chart reviewed as part of pre-operative protocol coverage. Patient was contacted 11/04/2019 in reference to pre-operative risk assessment for pending surgery as outlined below.  Bertram Millard KANWAL TRUSCOTT was last seen on 03/10/2019 via a telemedicine visit with Ermalinda Barrios, PA-C. Since that day, SCHERRIE BAECHLE has been stable from a heart standpoint. She continues to have intermittent atypical chest pain, though has not required SL nitro use. She has chronic SOB which is unchanged. She is limited in mobility by SOB and back pain, and is unable to complete 4 METs. Spinal injections are low risk procedures, therefore I do not believe further cardiac work-up is needed prior to this procedure.   I will route to Dr. Irish Lack for input on holding plavix prior to her injection.   She is overdue for a follow-up visit, therefore will route to [facility] scheduling to assist with a visit.   Abigail Butts, PA-C 11/04/2019, 4:49 PM

## 2019-11-04 NOTE — Telephone Encounter (Signed)
OK to hold plavix 7 days prior to injection.

## 2019-11-05 NOTE — Telephone Encounter (Signed)
Called pt and scheduled her 11/22/2019 with driver and advised pt to stop BT on 11/15/19. Pt understood.

## 2019-11-05 NOTE — Telephone Encounter (Signed)
   Primary Cardiologist: Larae Grooms, MD  Chart reviewed as part of pre-operative protocol coverage. Per Dr. Irish Lack, patient can hold plavix 7 days prior to her upcoming spinal injection. Plavix should be restarted as soon as she is cleared to do so by her surgeon.   I will route this recommendation to the requesting party via Epic fax function and remove from pre-op pool.  Please call with questions.  Abigail Butts, PA-C 11/05/2019, 8:17 AM

## 2019-11-16 ENCOUNTER — Telehealth: Payer: Self-pay | Admitting: Physical Medicine and Rehabilitation

## 2019-11-16 NOTE — Telephone Encounter (Signed)
Patient called and asked if we could get her in sooner than the 25th for her ESI. She takes Plavix every other day. I advised that we could not get her in any sooner because she needs to hold the Plavix for 7 days prior to the injection. She stated that she has never had to do this for an injection before. I advised that this is a different type of injection and she will need to hold this (approved by her cardiologist) until after her injection next week. She expressed understanding.

## 2019-11-17 NOTE — Progress Notes (Signed)
Virtual Visit via Telephone Note  I connected with Sheryl Curtis on 11/18/19 at 10:30 AM EST by telephone and verified that I am speaking with the correct person using two identifiers.  Location: Patient: Home Provider: [facility] - [id number], [place], [place], [place] [address]   I discussed the limitations, risks, security and privacy concerns of performing an evaluation and management service by telephone and the availability of in person appointments. I also discussed with the patient that there may be a patient responsible charge related to this service. The patient expressed understanding and agreed to proceed.  Patient consented to consult via telephone: Yes People present and their role in pt care: Pt    History of Present Illness:  86 year old female former smoker followed in our office for asthma, obstructive sleep apnea, nocturnal hypoxia, allergic rhinitis  PMH: Allergic rhinitis, A. fib, CAD, hypertension Smoker/ Smoking History: Former smoker.  Quit 1964.  12-pack-year smoking history. Maintenance: Symbicort 160 Pt of: Dr. Elsworth Soho  Chief complaint: Sinus drainage   86 year old female former smoker followed in our office for asthma, obstructive sleep apnea, allergic rhinitis.  Patient contacted our office yesterday reporting she is had increased sinus congestion and drainage.  Symptoms started on 11/13/2019.  Sinus drainage is yellow.  Denies fevers.  Patient checked temperature while on the phone with me today and it was 97.7.  Patient's daughter who she lives with is also currently symptomatic around the same timeframe.  Patient has not been tested for Covid.  Patient reports that she is having sinus drainage, pain, headache as well as a productive cough with yellow sputum.  She denies body aches.  Patient does report loss of taste but she says this is a chronic finding she has been dealing with for many years now.  Patient as well as her daughter have not  been tested for Covid yet.  Observations/Objective:  11/18/19 - temp - 97.7  CTchest 03/2017 -stable multiple pulmonary nodules since 2015  Spirometry in 05/2010 showed FEV1 of 76% improvement postbronchodilator to 80%-1.60 with a ratio of 75 suggesting mild restriction. Spirometry 03/2014 FEV1 of 1.25-64%, with ratio 65 and FVC of 1.91-72%.  PFTs - 12/2014 - no airway obstruction, ratio 71, pos BD response PFTs 11/2018, and mild airway obstruction with FEV1 79%, TLC 101%, DLCO normal, no bronchodilator response  06/2015 Admit for UTI and Sepsis   NPSG >> AHI 10/h corrected by CPAP 10 cm  Social History   Tobacco Use  Smoking Status Former Smoker  . Packs/day: 1.00  . Years: 12.00  . Pack years: 12.00  . Types: Cigarettes  . Start date: 10/28/1949  . Quit date: 10/28/1962  . Years since quitting: 57.0  Smokeless Tobacco Never Used   Immunization History  Administered Date(s) Administered  . Pneumococcal Conjugate-13 11/23/2013  . Pneumococcal Polysaccharide-23 08/27/2007, 04/21/2019  . Tdap 08/04/2008  . Zoster Recombinat (Shingrix) 02/19/2017, 09/24/2017    Assessment and Plan:  Mild persistent asthma without complication Plan: Continue Symbicort 160 Keep close follow-up with our office  OSA (obstructive sleep apnea) Plan: We will continue to monitor you clinically  Cough We will treat as asthmatic bronchitis today  Plan: Obtain outpatient Covid testing today >>> Our office personally coordinated the online Covid test schedule for the patient as well as her daughter Doxycycline today Notify our office if symptoms are not improving    Follow Up Instructions:  Return in about 4 months (around 03/17/2020), or if symptoms worsen or  fail to improve, for Follow up with Dr. Elsworth Soho, Follow up with Wyn Quaker FNP-C.   I discussed the assessment and treatment plan with the patient. The patient was provided an opportunity to ask questions and all were answered. The  patient agreed with the plan and demonstrated an understanding of the instructions.   The patient was advised to call back or seek an in-person evaluation if the symptoms worsen or if the condition fails to improve as anticipated.  I provided 28 minutes of non-face-to-face time during this encounter.   Lauraine Rinne, NP

## 2019-11-18 ENCOUNTER — Encounter: Payer: Self-pay | Admitting: Pulmonary Disease

## 2019-11-18 ENCOUNTER — Ambulatory Visit (INDEPENDENT_AMBULATORY_CARE_PROVIDER_SITE_OTHER): Payer: Medicare Other | Admitting: Pulmonary Disease

## 2019-11-18 ENCOUNTER — Other Ambulatory Visit: Payer: Self-pay

## 2019-11-18 ENCOUNTER — Ambulatory Visit: Payer: Medicare Other | Attending: Internal Medicine

## 2019-11-18 DIAGNOSIS — Z20822 Contact with and (suspected) exposure to covid-19: Secondary | ICD-10-CM

## 2019-11-18 DIAGNOSIS — R05 Cough: Secondary | ICD-10-CM

## 2019-11-18 DIAGNOSIS — R059 Cough, unspecified: Secondary | ICD-10-CM

## 2019-11-18 DIAGNOSIS — G4733 Obstructive sleep apnea (adult) (pediatric): Secondary | ICD-10-CM

## 2019-11-18 DIAGNOSIS — J4530 Mild persistent asthma, uncomplicated: Secondary | ICD-10-CM | POA: Diagnosis not present

## 2019-11-18 MED ORDER — DOXYCYCLINE HYCLATE 100 MG PO TABS: 100.0000 mg | ORAL_TABLET | Freq: Two times a day (BID) | ORAL | 0 refills | Status: DC

## 2019-11-18 NOTE — Assessment & Plan Note (Signed)
Plan: Continue Symbicort 160 Keep close follow-up with our office

## 2019-11-18 NOTE — Assessment & Plan Note (Signed)
We will treat as asthmatic bronchitis today  Plan: Obtain outpatient Covid testing today >>> Our office personally coordinated the online Covid test schedule for the patient as well as her daughter Doxycycline today Notify our office if symptoms are not improving

## 2019-11-18 NOTE — Assessment & Plan Note (Signed)
Plan: We will continue to monitor you clinically

## 2019-11-18 NOTE — Patient Instructions (Addendum)
You were seen today by Lauraine Rinne, NP  for:   1. Cough  - doxycycline (VIBRA-TABS) 100 MG tablet; Take 1 tablet (100 mg total) by mouth 2 (two) times daily.  Dispense: 14 tablet; Refill: 0  Please present to the [facility] testing site today to obtain Covid testing   COVID Testing Site Locations (For sick patients only, pre-procedure is done differently)  . [place], [place], [place] [address] . [place]  (on [employer])  o Nature conservation officer  . [place], [place] [address]   [place], [facility] and [facility] will be open from 10 a.m. - 3 p.m.     2. Mild persistent asthma without complication  Continue Symbicort 160 >>> 2 puffs in the morning right when you wake up, rinse out your mouth after use, 12 hours later 2 puffs, rinse after use >>> Take this daily, no matter what >>> This is not a rescue inhaler     We recommend today:   Meds ordered this encounter  Medications  . doxycycline (VIBRA-TABS) 100 MG tablet    Sig: Take 1 tablet (100 mg total) by mouth 2 (two) times daily.    Dispense:  14 tablet    Refill:  0    Follow Up:    Return in about 4 months (around 03/17/2020), or if symptoms worsen or fail to improve, for Follow up with Dr. Elsworth Soho, Follow up with Wyn Quaker FNP-C.   Please do your part to reduce the spread of COVID-19:      Reduce your risk of any infection  and COVID19 by using the similar precautions used for avoiding the common cold or flu:  Marland Kitchen Wash your hands often with soap and warm water for at least 20 seconds.  If soap and water are not readily available, use an alcohol-based hand sanitizer with at least 60% alcohol.  . If coughing or sneezing, cover your mouth and nose by coughing or sneezing into the elbow areas of your shirt or coat, into a tissue or into your sleeve (not your hands). Langley Gauss A  MASK when in public  . Avoid shaking hands with others and consider head nods or verbal greetings only. . Avoid touching your eyes, nose, or mouth with unwashed hands.  . Avoid close contact with people who are sick. . Avoid places or events with large numbers of people in one location, like concerts or sporting events. . If you have some symptoms but not all symptoms, continue to monitor at home and seek medical attention if your symptoms worsen. . If you are having a medical emergency, call 911.   [place] / e-Visit: [email or web address]         [facility]: [facility]: [id number]                   [facility]: [id number]     It is flu season:   >>> Best ways to protect herself from the flu: Receive the yearly flu vaccine, practice good hand hygiene washing with soap and also using hand sanitizer when available, eat a nutritious meals, get adequate rest, hydrate appropriately   Please contact the office if your symptoms worsen or you have concerns that you are  not improving.   Thank you for choosing [facility] for your healthcare, and for allowing [place] to partner with you on your healthcare journey. I am thankful to be able to provide care to you today.   Wyn Quaker FNP-C

## 2019-11-19 ENCOUNTER — Telehealth: Payer: Self-pay | Admitting: Pulmonary Disease

## 2019-11-19 LAB — NOVEL CORONAVIRUS, NAA: SARS-CoV-2, NAA: DETECTED — AB

## 2019-11-19 NOTE — Telephone Encounter (Signed)
11/19/2019  Received patient's positive Covid test results.  Contacted patient personally to review these with her.  Patient would qualify for the monoclonal antibody infusion and could be added to the wait list at [facility] for this.  Patient declines.  She feels that she is symptomatically improved.  Her daughter is also currently positive.  We have added her daughter to the wait list for the infusion.  Reviewed symptomatic management with the patient.  She is still currently taking doxycycline as prescribed earlier this week on a televisit.  Patient is to follow-up with our office if symptoms worsen.  Wyn Quaker, FNP

## 2019-11-20 ENCOUNTER — Telehealth (HOSPITAL_COMMUNITY): Payer: Self-pay | Admitting: Nurse Practitioner

## 2019-11-20 NOTE — Telephone Encounter (Signed)
Called to Discuss with patient about Covid symptoms and the use of bamlanivimab, a monoclonal antibody infusion for those with mild to moderate Covid symptoms and at a high risk of hospitalization.     Pt is qualified for this infusion at the [facility] infusion center due to co-morbid conditions and/or a member of an at-risk group.     Unable to reach pt. Upon chart review appears that patient was contacted yesterday and declined infusion.

## 2019-11-22 ENCOUNTER — Encounter: Payer: Medicare Other | Admitting: Physical Medicine and Rehabilitation

## 2019-11-25 ENCOUNTER — Telehealth: Payer: Self-pay | Admitting: *Deleted

## 2019-11-25 NOTE — Telephone Encounter (Signed)
Patient left a message stating that her pain meds were not working.  I contacted patient and was able to narrow down that she was talking about lyrica 100 mg capsule, BID.  Patient saysa she contacted her pharmacy and apparently the pharmacist gave her permission to take 2 capsules (200 mg). Patient says that 2 capsules helped.  I told the patient that I would forward message to Dr. Ranell Patrick.  Side note: I have already spoken to Dr. Ranell Patrick in person. She stated she would call the patient.  This note was posted for documentation purposes.

## 2019-11-26 ENCOUNTER — Telehealth: Payer: Self-pay | Admitting: Physical Medicine and Rehabilitation

## 2019-11-26 DIAGNOSIS — M797 Fibromyalgia: Secondary | ICD-10-CM

## 2019-11-26 DIAGNOSIS — G8929 Other chronic pain: Secondary | ICD-10-CM

## 2019-11-26 DIAGNOSIS — M47812 Spondylosis without myelopathy or radiculopathy, cervical region: Secondary | ICD-10-CM

## 2019-11-26 DIAGNOSIS — M25512 Pain in left shoulder: Secondary | ICD-10-CM

## 2019-11-26 DIAGNOSIS — M542 Cervicalgia: Secondary | ICD-10-CM

## 2019-11-26 DIAGNOSIS — M7061 Trochanteric bursitis, right hip: Secondary | ICD-10-CM

## 2019-11-26 MED ORDER — CYCLOBENZAPRINE HCL 5 MG PO TABS: 5.0000 mg | ORAL_TABLET | Freq: Three times a day (TID) | ORAL | Status: DC | PRN

## 2019-11-26 MED ORDER — PREGABALIN 200 MG PO CAPS: 200.0000 mg | ORAL_CAPSULE | Freq: Two times a day (BID) | ORAL | 0 refills | Status: DC

## 2019-11-26 NOTE — Telephone Encounter (Signed)
Patient in severe pain and has started taking Lyrica 2 pills in morning 2 at night. She is tolerating this well and it helps her. I have sent new script for 200mg  BID.

## 2019-11-30 ENCOUNTER — Other Ambulatory Visit: Payer: Self-pay

## 2019-11-30 ENCOUNTER — Encounter: Payer: Self-pay | Admitting: Physical Medicine and Rehabilitation

## 2019-11-30 ENCOUNTER — Encounter
Payer: Medicare Other | Attending: Physical Medicine and Rehabilitation | Admitting: Physical Medicine and Rehabilitation

## 2019-11-30 VITALS — Ht 64.0 in | Wt 208.0 lb

## 2019-11-30 DIAGNOSIS — M47812 Spondylosis without myelopathy or radiculopathy, cervical region: Secondary | ICD-10-CM

## 2019-11-30 DIAGNOSIS — M7061 Trochanteric bursitis, right hip: Secondary | ICD-10-CM

## 2019-11-30 DIAGNOSIS — G894 Chronic pain syndrome: Secondary | ICD-10-CM | POA: Diagnosis not present

## 2019-11-30 DIAGNOSIS — G8929 Other chronic pain: Secondary | ICD-10-CM | POA: Insufficient documentation

## 2019-11-30 DIAGNOSIS — M797 Fibromyalgia: Secondary | ICD-10-CM | POA: Diagnosis not present

## 2019-11-30 DIAGNOSIS — M25512 Pain in left shoulder: Secondary | ICD-10-CM | POA: Diagnosis not present

## 2019-11-30 MED ORDER — PREGABALIN 225 MG PO CAPS: 225.0000 mg | ORAL_CAPSULE | Freq: Two times a day (BID) | ORAL | 1 refills | Status: DC

## 2019-11-30 MED ORDER — CYCLOBENZAPRINE HCL 5 MG PO TABS: 5.0000 mg | ORAL_TABLET | Freq: Three times a day (TID) | ORAL | 0 refills | Status: DC | PRN

## 2019-11-30 NOTE — Progress Notes (Signed)
Subjective:    Patient ID: Sheryl Curtis, female    DOB: 1932/02/19, 86 y.o.   MRN: [id number]  HPI  Due to national recommendations of social distancing because of COVID 77, an audio/video tele-health visit is felt to be the most appropriate encounter for this patient at this time. See MyChart message from today for the patient's consent to a tele-health encounter with [place]. This is a follow up tele-visit via phone. The patient is at home. MD is at office.   11/02/19: [name] presents for phone follow-up of her fibromyalgia, chronic left shoulder pain, cervical spondylosis, She has pains in her bilateral shoulders, neck, bilateral hips, knees, ankles, and low back. She has been diagnosed with fibromyalgia, osteoarthritis, and rheumatoid arthritis. Her quality of life is greatly diminished and she mostly watches TV and uses her ipad. Once in a while she ambulates around her home with assistive device or ambulates outside, but she finds it too cold now and she is in too much pain. She finds the Tramadol does not help her. She believes she was on 50mg  ofLyrica in the past without benefit.Last visit I prescribed 50mg  BID but she did not find relief with this. She did not experience side effects. She asks whether the Lyrica will also help with her OA and RA and notes that her shoulder pain has been worsening.   2/2: [name] recently was diagnosed with COVID and has been quarantined at home. She does not feel SOB but does feel very weak. Her daughter got COVID first, requiring admission to the hospital. She was recently discharged and returned home to help care for her mother. She also provides history during this phone call. Her pain has continued to be severe, although she does find relief from the Lyrica. It does not make her too sleepy. She would also like to try a muscle relaxer for her muscle spasms. Her pains continue to be diffuse and worst in  her neck, shoulders, and lower back. She is able to ambulate around her home but has been unable to be more mobile due to her COVID-related weakness.   Pain Inventory Average Pain 7 Pain Right Now 4 My pain is intermittent, constant, sharp, burning, dull, stabbing, tingling and aching  In the last 24 hours, has pain interfered with the following? General activity 5 Relation with others 6 Enjoyment of life 5 What TIME of day is your pain at its worst? daytime Sleep (in general) Good  Pain is worse with: walking, standing and some activites Pain improves with: rest and medication Relief from Meds: 1  Mobility walk with assistance use a cane use a walker ability to climb steps?  no do you drive?  no  Function retired  Neuro/Psych weakness trouble walking  Prior Studies Any changes since last visit?  no  Physicians involved in your care Any changes since last visit?  no   Family History  Problem Relation Age of Onset  . CAD Brother   . Heart attack Brother 55  . Bone cancer Sister   . Throat cancer Daughter    Social History   Socioeconomic History  . Marital status: Widowed    Spouse name: Not on file  . Number of children: 4  . Years of education: Not on file  . Highest education level: Not on file  Occupational History  . Not on file  Tobacco Use  . Smoking status: Former Smoker  Packs/day: 1.00    Years: 12.00    Pack years: 12.00    Types: Cigarettes    Start date: 10/28/1949    Quit date: 10/28/1962    Years since quitting: 57.1  . Smokeless tobacco: Never Used  Substance and Sexual Activity  . Alcohol use: No  . Drug use: No  . Sexual activity: Not on file  Other Topics Concern  . Not on file  Social History Narrative  . Not on file   Social Determinants of Health   Financial Resource Strain: Low Risk   . Difficulty of Paying Living Expenses: Not hard at all  Food Insecurity: No Food Insecurity  . Worried About Charity fundraiser in  the Last Year: Never true  . Ran Out of Food in the Last Year: Never true  Transportation Needs: No Transportation Needs  . Lack of Transportation (Medical): No  . Lack of Transportation (Non-Medical): No  Physical Activity: Inactive  . Days of Exercise per Week: 0 days  . Minutes of Exercise per Session: 0 min  Stress: No Stress Concern Present  . Feeling of Stress : Only a little  Social Connections: Somewhat Isolated  . Frequency of Communication with Friends and Family: More than three times a week  . Frequency of Social Gatherings with Friends and Family: More than three times a week  . Attends Religious Services: 1 to 4 times per year  . Active Member of Clubs or Organizations: No  . Attends Archivist Meetings: Never  . Marital Status: Widowed   Past Surgical History:  Procedure Laterality Date  . ABDOMINAL HYSTERECTOMY    . BACK SURGERY    . BREAST SURGERY    . COLON SURGERY    . CORONARY STENT PLACEMENT    . PERCUTANEOUS CORONARY STENT INTERVENTION (PCI-S) N/A 05/19/2013   Procedure: PERCUTANEOUS CORONARY STENT INTERVENTION (PCI-S);  Surgeon: Jettie Booze, MD;  Location: [facility] CATH LAB;  Service: Cardiovascular;  Laterality: N/A;   Past Medical History:  Diagnosis Date  . ALLERGIC RHINITIS   . Atrial fibrillation ([place])   . CANCER, COLORECTAL   . CHEST PAIN, ATYPICAL   . CKD (chronic kidney disease)   . DYSPNEA   . Essential hypertension, benign   . FIBROMYALGIA   . G E R D   . History of stress test    Myoview 8/16:  EF 69%, anterior and apical defect c/w breast attenuation; Low Risk   . INSOMNIA   . Morbid obesity ([place])   . Other and unspecified angina pectoris   . Sleep apnea    Ht 5\' 4"  (1.626 m)   Wt 208 lb (94.3 kg)   BMI 35.70 kg/m   Opioid Risk Score:   Fall Risk Score:  `1  Depression screen PHQ 2/9  Depression screen [facility] 2/9 10/25/2019 07/22/2019 04/21/2019 01/13/2019 01/04/2019 10/29/2018 08/28/2015  Decreased Interest 0 0 0 1 0 0 0    Down, Depressed, Hopeless 0 0 0 1 0 0 0  PHQ - 2 Score 0 0 0 2 0 0 0  Altered sleeping - - - 1 - - -  Tired, decreased energy - - - 1 - - -  Change in appetite - - - 0 - - -  Feeling bad or failure about yourself  - - - 1 - - -  Trouble concentrating - - - 1 - - -  Moving slowly or fidgety/restless - - - 0 - - -  Suicidal  thoughts - - - 0 - - -  PHQ-9 Score - - - 6 - - -  Difficult doing work/chores - - - Somewhat difficult - - -  Some recent data might be hidden     Review of Systems  Constitutional: Negative.   HENT: Negative.   Eyes: Negative.   Respiratory: Negative.   Cardiovascular: Negative.   Gastrointestinal: Negative.   Endocrine: Negative.   Genitourinary: Negative.   Musculoskeletal: Positive for arthralgias, back pain and gait problem.  Skin: Negative.   Allergic/Immunologic: Negative.   Neurological: Positive for weakness.  Hematological: Negative.   Psychiatric/Behavioral: Negative.   All other systems reviewed and are negative.      Objective:   Physical Exam Not performed as patient was seen via webex.        Assessment & Plan:  [name] presents with diffuse pains. She has pains in her bilateral shoulders, neck, bilateral hips, knees, ankles, and low back.  1) Fibromyalgia: --Recommend uptitrating Lyrica to 225mg  BID given continued pain with no medication side effects. --Discussed that out goal will be to provide pain relief so we can increase her mobility, as exercise is a crucial component of fibromyalgia treatment.Currently her mobility is very limited secondary to her COVID-related weakness.   2) Chronic left shoulder pain: I have personally reviewed XR; within normal limits.   3) Rheumatoid arthritis. Patient asks if Lyrica will help with RA and I advise that there are RA specific medications needed to treat this condition. She says she has not followed with a rheumatologist in years. At next visit, we will check RF and ANA  levels to assess whether rheumatology referral is warranted.   All questions were encouraged and answered. Follow up with me in6 weeks.

## 2019-12-01 ENCOUNTER — Ambulatory Visit (INDEPENDENT_AMBULATORY_CARE_PROVIDER_SITE_OTHER): Payer: Medicare Other | Admitting: Acute Care

## 2019-12-01 ENCOUNTER — Encounter: Payer: Self-pay | Admitting: Acute Care

## 2019-12-01 ENCOUNTER — Telehealth: Payer: Self-pay | Admitting: Pulmonary Disease

## 2019-12-01 ENCOUNTER — Other Ambulatory Visit: Payer: Self-pay

## 2019-12-01 DIAGNOSIS — R05 Cough: Secondary | ICD-10-CM | POA: Diagnosis not present

## 2019-12-01 DIAGNOSIS — U071 COVID-19: Secondary | ICD-10-CM | POA: Diagnosis not present

## 2019-12-01 DIAGNOSIS — J4530 Mild persistent asthma, uncomplicated: Secondary | ICD-10-CM | POA: Diagnosis not present

## 2019-12-01 DIAGNOSIS — R059 Cough, unspecified: Secondary | ICD-10-CM

## 2019-12-01 DIAGNOSIS — G4733 Obstructive sleep apnea (adult) (pediatric): Secondary | ICD-10-CM | POA: Diagnosis not present

## 2019-12-01 MED ORDER — BUDESONIDE-FORMOTEROL FUMARATE 160-4.5 MCG/ACT IN AERO: 2.0000 | INHALATION_SPRAY | Freq: Two times a day (BID) | RESPIRATORY_TRACT | 6 refills | Status: DC

## 2019-12-01 NOTE — Telephone Encounter (Signed)
Spoke with the pt  She was dx with covid 2 wks ago  She is c/o dry, hacking cough, body aches and fatigue  Televisit with SG scheduled for today

## 2019-12-01 NOTE — Progress Notes (Signed)
Virtual Visit via Telephone Note  I connected with Sheryl Curtis on 12/01/19 at  2:30 PM EST by telephone and verified that I am speaking with the correct person using two identifiers.  Location: Patient: At home Provider:  [place], [place], [place], [address]   I discussed the limitations, risks, security and privacy concerns of performing an evaluation and management service by telephone and the availability of in person appointments. I also discussed with the patient that there may be a patient responsible charge related to this service. The patient expressed understanding and agreed to proceed.  Patient consented to consult via telephone: Yes People present and their role in pt care: Pt    History of Present Illness: 86 year old female former smoker followed in our office for asthma, obstructive sleep apnea, nocturnal hypoxia, allergic rhinitis  PMH: Allergic rhinitis, A. fib, CAD, hypertension Smoker/ Smoking History: Former smoker.  Quit 1964.  12-pack-year smoking history. Maintenance: Symbicort 160 Pt of: Dr. Elsworth Soho  Pt. Presents for acute tele visit for cough. She was last seen via telemedicine 11/18/2019 by Wyn Quaker NP. At the time her Chief Complaint was cough due to sinus drainage. This was suspicious for COVID. She was instructed to present for COVID testing at [facility], she was treated with Doxycycline 100 mg BID x 7 days and told to continue her Symbicort. Her COVID test was +, and she has been managing symptoms at home.  She presents today for continued cough. She has completed her treatment with Doxycycline as prescribed. She said she has been doing " ok" but developed a cough in the last 2 days. She states that this cough is dry, and that she is not getting up any secretions. She denies any post nasal drip. She states she is using her albuterol nebs twice daily and that they have been helping. She states her breathing is baseline.She states that due to  the cost of Symbicort, she does not use it every day as she cannot afford to do so. She uses it more like a rescue medication. I had her take her temperature today and it was 98.1. She denies any chills of night sweats.    Observations/Objective:  11/18/2019 SARS-CoV-2, NAA Not Detected DetectedAbnormal      Temperature  Today 98.1 Oral CTchest 03/2017 -stable multiple pulmonary nodules since 2015  Spirometry in 05/2010 showed FEV1 of 76% improvement postbronchodilator to 80%-1.60 with a ratio of 75 suggesting mild restriction. Spirometry 03/2014 FEV1 of 1.25-64%, with ratio 65 and FVC of 1.91-72%.  PFTs - 12/2014 - no airway obstruction, ratio 71, pos BD response PFTs 11/2018, and mild airway obstruction with FEV1 79%, TLC 101%, DLCO normal, no bronchodilator response  06/2015 Admit for UTI and Sepsis   NPSG >>AHI 10/h corrected by CPAP 10 cm  Assessment and Plan: Cough + Covid Test 11/18/2019 ( 13 days ago) Treated with Doxycycline 100 mg BID x 7 days ( started 11/18/2019) Still has cough Plan Start Mucinex DM every BID  with a full glass of water Start using your Flutter valve 4 - 5 puffs  Twice daily Continue using your albuterol nebulizer twice daily, in the morning and the evening Start Delsym Cough Syrup 2 teaspoons every 12 hours per label instructions  Sips of water instead of throat clearing Sugar Free [employer] or [employer] originals for throat soothing. Warm drinks like hot tea and honey for throat soothing  Follow up in 1 week 12/08/2019 at 2:30 ( Tele visit)  Mild persistent asthma without complication Not taking daily as the cost is so high she tries to make it last longer by using prn Plan: Continue Symbicort 160 Rinse mouth after use 2 puffs in the morning right when you wake up, rinse out your mouth after use, 12 hours later 2 puffs, rinse after use Take this daily, no matter what This is not a rescue inhaler We will send in a prescription for the  generic form of Symbicort as cost is an issue. This may cost less for you.  We will also send you paperwork to complete to see if you qualify for financial assistance for your medication through the drug company who produces  the drug.   OSA (obstructive sleep apnea) Plan: Use your CPAP machine daily as able We will continue to monitor you clinically Follow up with Dr. Elsworth Soho in 2 months or before as able  COVID Diagnosed 11/18/2019 Plan Continue to follow the directions you are receiving from the state regarding your quarantine and symptomatic management of your symptoms. If your breathing suddenly worsens, please seek emergency care. Symptomatic management as outlined above    Follow Up Instructions: Follow up 12/08/2019 at 2:30 with a televisit with Judson Roch NP to ensure you are doing better. Call if you need [place] sooner for any reason.   I discussed the assessment and treatment plan with the patient. The patient was provided an opportunity to ask questions and all were answered. The patient agreed with the plan and demonstrated an understanding of the instructions.   The patient was advised to call back or seek an in-person evaluation if the symptoms worsen or if the condition fails to improve as anticipated.  I provided 35 minutes of non-face-to-face time during this encounter.   Magdalen Spatz, NP 12/01/2019

## 2019-12-01 NOTE — Patient Instructions (Addendum)
It was good to talk with you. Start Mucinex DM every BID  with a full glass of water Start using your Flutter valve 4 - 5 puffs  Twice daily Continue using your albuterol nebulizer twice daily, in the morning and the evening Start Delsym Cough Syrup 2 teaspoons every 12 hours per label instructions  Sips of water instead of throat clearing Sugar Free [employer] or [employer] originals for throat soothing. Warm drinks like hot tea and honey for throat soothing  Follow up in 1 week 12/08/2019 at 2:30 ( Tele visit) Continue Symbicort 160 Rinse mouth after use 2 puffs in the morning right when you wake up, rinse out your mouth after use, 12 hours later 2 puffs, rinse after use Take this daily, no matter what This is not a rescue inhaler We will send in a prescription for the generic form of Symbicort as cost is an issue. This may decrease the cost  for you.  We will also send you paperwork to complete to see if you qualify for financial assistance for your medication through the drug company who produces  the drug. Use your CPAP machine daily as able We will continue to monitor you clinically Continue to follow the directions you are receiving from the state regarding your quarantine and symptomatic management of your symptoms. If your breathing suddenly worsens, please seek emergency care. Symptomatic management as outlined above Follow up 12/08/2019 at 2:30 with a televisit with Judson Roch NP to ensure you are doing better. Call if you need [place] sooner for any reason.

## 2019-12-02 ENCOUNTER — Telehealth: Payer: Self-pay | Admitting: Pulmonary Disease

## 2019-12-02 NOTE — Telephone Encounter (Signed)
Called and spoke to pt. Pt states for the past few days she has been having tremors in the morning upon waking and then they will taper off as the day continues. Pt denies any SOB, cough, wheezing, CP/tightness, f/c/s, swelling. Pt is alert and oriented and sounded well over the phone. Pt denies paleness or diaphoresis. Advised pt to contact her PCP about the tremors. Pt verbalized understanding and denied any further questions or concerns at this time.    Will forward to Dr. Elsworth Soho as Juluis Rainier.

## 2019-12-06 ENCOUNTER — Other Ambulatory Visit: Payer: Self-pay

## 2019-12-06 ENCOUNTER — Emergency Department (HOSPITAL_COMMUNITY): Payer: Medicare Other

## 2019-12-06 ENCOUNTER — Emergency Department (HOSPITAL_COMMUNITY)
Admission: EM | Admit: 2019-12-06 | Discharge: 2019-12-06 | Disposition: A | Discharge status: Home / Self Care | Payer: Medicare Other | Attending: Emergency Medicine | Admitting: Emergency Medicine

## 2019-12-06 DIAGNOSIS — Z79899 Other long term (current) drug therapy: Secondary | ICD-10-CM | POA: Insufficient documentation

## 2019-12-06 DIAGNOSIS — Z8616 Personal history of COVID-19: Secondary | ICD-10-CM | POA: Diagnosis not present

## 2019-12-06 DIAGNOSIS — I129 Hypertensive chronic kidney disease with stage 1 through stage 4 chronic kidney disease, or unspecified chronic kidney disease: Secondary | ICD-10-CM | POA: Diagnosis not present

## 2019-12-06 DIAGNOSIS — I4891 Unspecified atrial fibrillation: Secondary | ICD-10-CM | POA: Insufficient documentation

## 2019-12-06 DIAGNOSIS — Z85038 Personal history of other malignant neoplasm of large intestine: Secondary | ICD-10-CM | POA: Insufficient documentation

## 2019-12-06 DIAGNOSIS — N183 Chronic kidney disease, stage 3 unspecified: Secondary | ICD-10-CM | POA: Diagnosis not present

## 2019-12-06 DIAGNOSIS — R251 Tremor, unspecified: Secondary | ICD-10-CM

## 2019-12-06 DIAGNOSIS — U071 COVID-19: Secondary | ICD-10-CM

## 2019-12-06 DIAGNOSIS — Z7901 Long term (current) use of anticoagulants: Secondary | ICD-10-CM | POA: Insufficient documentation

## 2019-12-06 LAB — CBC WITH DIFFERENTIAL/PLATELET
Abs Immature Granulocytes: 0.05 10*3/uL (ref 0.00–0.07)
Basophils Absolute: 0.1 10*3/uL (ref 0.0–0.1)
Basophils Relative: 1 %
Eosinophils Absolute: 0.3 10*3/uL (ref 0.0–0.5)
Eosinophils Relative: 3 %
HCT: 40.9 % (ref 36.0–46.0)
Hemoglobin: 13.3 g/dL (ref 12.0–15.0)
Immature Granulocytes: 1 %
Lymphocytes Relative: 24 %
Lymphs Abs: 2.5 10*3/uL (ref 0.7–4.0)
MCH: 31.7 pg (ref 26.0–34.0)
MCHC: 32.5 g/dL (ref 30.0–36.0)
MCV: 97.6 fL (ref 80.0–100.0)
Monocytes Absolute: 0.9 10*3/uL (ref 0.1–1.0)
Monocytes Relative: 9 %
Neutro Abs: 6.5 10*3/uL (ref 1.7–7.7)
Neutrophils Relative %: 62 %
Platelets: 242 10*3/uL (ref 150–400)
RBC: 4.19 MIL/uL (ref 3.87–5.11)
RDW: 13.7 % (ref 11.5–15.5)
WBC: 10.2 10*3/uL (ref 4.0–10.5)
nRBC: 0 % (ref 0.0–0.2)

## 2019-12-06 LAB — URINALYSIS, ROUTINE W REFLEX MICROSCOPIC
Bilirubin Urine: NEGATIVE
Glucose, UA: NEGATIVE mg/dL
Hgb urine dipstick: NEGATIVE
Ketones, ur: NEGATIVE mg/dL
Leukocytes,Ua: NEGATIVE
Nitrite: NEGATIVE
Protein, ur: NEGATIVE mg/dL
Specific Gravity, Urine: 1.003 — ABNORMAL LOW (ref 1.005–1.030)
pH: 6 (ref 5.0–8.0)

## 2019-12-06 LAB — COMPREHENSIVE METABOLIC PANEL
ALT: 17 U/L (ref 0–44)
AST: 19 U/L (ref 15–41)
Albumin: 3.6 g/dL (ref 3.5–5.0)
Alkaline Phosphatase: 55 U/L (ref 38–126)
Anion gap: 10 (ref 5–15)
BUN: 18 mg/dL (ref 8–23)
CO2: 25 mmol/L (ref 22–32)
Calcium: 9.4 mg/dL (ref 8.9–10.3)
Chloride: 95 mmol/L — ABNORMAL LOW (ref 98–111)
Creatinine, Ser: 1.25 mg/dL — ABNORMAL HIGH (ref 0.44–1.00)
GFR calc Af Amer: 45 mL/min — ABNORMAL LOW (ref >60)
GFR calc non Af Amer: 39 mL/min — ABNORMAL LOW (ref >60)
Glucose, Bld: 108 mg/dL — ABNORMAL HIGH (ref 70–99)
Potassium: 3.8 mmol/L (ref 3.5–5.1)
Sodium: 130 mmol/L — ABNORMAL LOW (ref 135–145)
Total Bilirubin: 0.4 mg/dL (ref 0.3–1.2)
Total Protein: 6.8 g/dL (ref 6.5–8.1)

## 2019-12-06 LAB — CK: Total CK: 42 U/L (ref 38–234)

## 2019-12-06 NOTE — ED Notes (Signed)
Pt given sandwich bag and water 

## 2019-12-06 NOTE — ED Provider Notes (Signed)
[facility] [place] EMERGENCY DEPARTMENT Provider Note   CSN: [id number] Arrival date & time: 12/06/19  1424     History Chief Complaint  Patient presents with  . Tremors    Bhakti GANELL GALANOS is a 86 y.o. female.  Patient is an 86 year old female with past medical history of hypertension, CKD, atrial fibrillation, obesity presenting to the emergency department for tremors.  Patient reports that these have been going on for about 2 to 3 weeks.  Reports that on 15 January she was having some sinus problems so she saw her pulmonologist and she was diagnosed with COVID-19.  Reports that she only had very mild sinus symptoms which have now resolved.  She reports that the tremors in her upper extremities began shortly after that.  Reports that they are mostly in the right upper and lower extremity but also has in the left upper and lower extremity.  Reports that last night they seem to be getting worse and involved her head and her trunk.  They come and go and are not constant.  There are no exacerbating or relieving factors.  She otherwise feels her normal self.  Denies any falls or syncope.        Past Medical History:  Diagnosis Date  . ALLERGIC RHINITIS   . Atrial fibrillation ([place])   . CANCER, COLORECTAL   . CHEST PAIN, ATYPICAL   . CKD (chronic kidney disease)   . DYSPNEA   . Essential hypertension, benign   . FIBROMYALGIA   . G E R D   . History of stress test    Myoview 8/16:  EF 69%, anterior and apical defect c/w breast attenuation; Low Risk   . INSOMNIA   . Morbid obesity ([place])   . Other and unspecified angina pectoris   . Sleep apnea     Patient Active Problem List   Diagnosis Date Noted  . Mild persistent asthma without complication [id number]  . Dysphagia 06/14/2019  . Neck pain, chronic 04/21/2019  . Physical deconditioning 12/09/2018  . CKD (chronic kidney disease)   . Morbid obesity ([place])   . Mild persistent asthma with (acute) exacerbation 07/16/2018   . Cough 07/16/2018  . OSA (obstructive sleep apnea) 07/30/2017  . Trochanteric bursitis, right hip 04/21/2017  . Nocturnal hypoxia 03/31/2017  . Sinus bradycardia 08/05/2016  . Hypertensive urgency 08/03/2016  . Asthma 02/19/2016  . Chronic kidney disease (CKD), stage III (moderate) 07/05/2015  . CAFL (chronic airflow limitation) ([place]) 07/05/2015  . Generalized OA 07/05/2015  . 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 H/O malignant neoplasm of colon 07/05/2015  . Elevated troponin 07/02/2015  . UTI (lower urinary tract infection) 07/01/2015  . Fibromyalgia syndrome 07/01/2015  . Hyponatremia 07/01/2015  . Urinary tract infectious disease   . Pulmonary nodules/lesions, multiple 04/11/2014  . Abdominal pain, generalized 04/07/2014  . Coronary atherosclerosis of native coronary artery 10/12/2013  . Essential hypertension, benign   . Other and unspecified angina pectoris   . Atrial fibrillation ([place]) 04/19/2010  . Allergic rhinitis 04/19/2010  . DYSPNEA 04/19/2010  . CHEST PAIN, ATYPICAL 04/19/2010  . CANCER, COLORECTAL 04/18/2010  . Essential hypertension 04/18/2010  . G E R D 04/18/2010  . Fibromyalgia 04/18/2010  . INSOMNIA 04/18/2010    Past Surgical History:  Procedure Laterality Date  . ABDOMINAL HYSTERECTOMY    . BACK SURGERY    . BREAST SURGERY    . COLON SURGERY    . CORONARY STENT PLACEMENT    . PERCUTANEOUS  CORONARY STENT INTERVENTION (PCI-S) N/A 05/19/2013   Procedure: PERCUTANEOUS CORONARY STENT INTERVENTION (PCI-S);  Surgeon: Jettie Booze, MD;  Location[facility] C[facility] LAB;  Service: Cardiovascular;  Laterality: N/A;     OB History   No obstetric history on file.     Family History  Problem Relation Age of Onset  . CAD Brother   . Heart attack Brother 17  . Bone cancer Sister   . Throat cancer Daughter     Social History   Tobacco Use  . Smoking status: Former Smoker    Packs/day: 1.00    Years: 12.00    Pack years: 12.00    Types: Cigarettes     Start date: 10/28/1949    Quit date: 10/28/1962    Years since quitting: 57.1  . Smokeless tobacco: Never Used  Substance Use Topics  . Alcohol use: No  . Drug use: No    Home Medications Prior to Admission medications   Medication Sig Start Date End Date Taking? Authorizing Provider  acetaminophen (TYLENOL) 650 MG CR tablet Take 650 mg by mouth every 8 (eight) hours as needed for pain.   Yes [provider]  albuterol (PROVENTIL) (2.5 MG/3ML) 0.083% nebulizer solution USE 1 VIAL VIA NEBULIZER EVERY 6 HOURS AS NEEDED Patient taking differently: Take 2.5 mg by nebulization every 6 (six) hours as needed for wheezing or shortness of breath.  09/28/19  Yes Rigoberto Noel, MD  Albuterol Sulfate (PROAIR RESPICLICK) 123XX123 (90 Base) MCG/ACT AEPB Inhale 1-2 puffs into the lungs every 6 (six) hours as needed (for shortness of breath and/or wheezing.). 12/09/18  Yes Rigoberto Noel, MD  ALPRAZolam Duanne Moron) 0.25 MG tablet Take 0.25 mg by mouth 3 (three) times daily as needed for anxiety.   Yes [provider]  Ascorbic Acid (VITAMIN C) 1000 MG tablet Take 1,000 mg by mouth daily.   Yes [provider]  atorvastatin (LIPITOR) 40 MG tablet TAKE 1 TABLET(40 MG) BY MOUTH DAILY Patient taking differently: Take 40 mg by mouth daily.  05/10/19  Yes Imogene Burn, PA-C  budesonide-formoterol (SYMBICORT) 160-4.5 MCG/ACT inhaler Inhale 2 puffs into the lungs 2 (two) times daily. 12/01/19  Yes Magdalen Spatz, NP  cetirizine (ZYRTEC) 10 MG tablet Take 10 mg by mouth daily.   Yes [provider]  Cholecalciferol (VITAMIN D) 50 MCG (2000 UT) tablet Take 2,000 Units by mouth daily.   Yes [provider]  clopidogrel (PLAVIX) 75 MG tablet TAKE 1 TABLET(75 MG) BY MOUTH EVERY OTHER DAY Patient taking differently: Take 75 mg by mouth every other day.  10/29/16  Yes Jettie Booze, MD  cyclobenzaprine (FLEXERIL) 5 MG tablet Take 1 tablet (5 mg total) by mouth 3 (three) times daily  as needed for muscle spasms. 11/30/19  Yes Raulkar, Clide Deutscher, MD  DULoxetine (CYMBALTA) 60 MG capsule Take 60 mg by mouth daily. 09/25/17  Yes [provider]  esomeprazole (NEXIUM) 20 MG capsule Take 20 mg by mouth daily.    Yes [provider]  furosemide (LASIX) 40 MG tablet Take 1 tablet (40 mg total) by mouth daily. 03/10/19  Yes Imogene Burn, PA-C  ipratropium (ATROVENT) 0.03 % nasal spray Place 2 sprays into both nostrils every 12 (twelve) hours. 10/06/18  Yes Martyn Ehrich, NP  isosorbide mononitrate (IMDUR) 60 MG 24 hr tablet Take 1 tablet (60 mg total) by mouth daily. 06/10/19  Yes Jettie Booze, MD  ketoconazole (NIZORAL) 2 % cream Apply 1  application topically daily as needed for irritation (rash).  10/27/19  Yes [provider]  meclizine (ANTIVERT) 25 MG tablet Take 1 tablet (25 mg total) by mouth 3 (three) times daily as needed for dizziness. 01/18/19  Yes Henson, Vickie L, NP-C  Omega-3 Fatty Acids (FISH OIL) 1200 MG CAPS Take 1,200 mg by mouth daily.   Yes [provider]  polyethylene glycol (MIRALAX / GLYCOLAX) 17 g packet Take 17 g by mouth 2 (two) times daily.   Yes [provider]  potassium chloride SA (KLOR-CON) 20 MEQ tablet TAKE 1 TABLET BY MOUTH  DAILY Patient taking differently: Take 20 mEq by mouth daily.  08/09/19  Yes Jettie Booze, MD  pregabalin (LYRICA) 200 MG capsule Take 200 mg by mouth 2 (two) times daily.   Yes [provider]  valsartan-hydrochlorothiazide (DIOVAN-HCT) 160-12.5 MG tablet Take 1 tablet by mouth daily.   Yes [provider]  budesonide-formoterol (SYMBICORT) 160-4.5 MCG/ACT inhaler Inhale 2 puffs into the lungs 2 (two) times daily. Patient not taking: Reported on 12/06/2019 07/15/18   Martyn Ehrich, NP  Nebulizers (COMPRESSOR NEBULIZER) MISC 1 Device by Does not apply route once. 04/04/16   Baird Lyons D, MD  pregabalin (LYRICA) 225 MG capsule Take 1 capsule (225  mg total) by mouth 2 (two) times daily. Patient not taking: Reported on 12/06/2019 11/30/19   Izora Ribas, MD  Respiratory Therapy Supplies (FLUTTER) DEVI Use flutter device 3 times a day 07/22/18   Martyn Ehrich, NP    Allergies    Aspirin, Hydroxyquinolines, Imipramine hcl, Pamelor [nortriptyline hcl], Prednisone, Procardia [nifedipine], Tramadol, Ace inhibitors, Influenza vaccines, and Plaquenil [hydroxychloroquine sulfate]  Review of Systems   Review of Systems  Constitutional: Negative for appetite change, chills and fever.  HENT: Negative for congestion, ear pain, sinus pressure, sinus pain and sore throat.   Respiratory: Negative for cough and shortness of breath.   Gastrointestinal: Negative for abdominal pain, nausea and vomiting.  Genitourinary: Negative for dysuria.  Musculoskeletal: Positive for back pain (chronic).  Skin: Negative for rash and wound.  Neurological: Positive for tremors. Negative for dizziness, seizures, syncope, facial asymmetry, speech difficulty, weakness, light-headedness, numbness and headaches.    Physical Exam Updated Vital Signs BP (!) 159/56   Pulse 81   Temp 97.7 F (36.5 C) (Oral)   Resp 16   SpO2 94%   Physical Exam Vitals and nursing note reviewed.  Constitutional:      General: She is not in acute distress.    Appearance: Normal appearance. She is not ill-appearing, toxic-appearing or diaphoretic.     Comments: Pleasant elderly female  HENT:     Head: Normocephalic.     Nose: Nose normal.     Mouth/Throat:     Mouth: Mucous membranes are moist.  Eyes:     General:        Right eye: No discharge.        Left eye: No discharge.     Extraocular Movements: Extraocular movements intact.     Conjunctiva/sclera: Conjunctivae normal.     Pupils: Pupils are equal, round, and reactive to light.     Comments: No nystagmus  Cardiovascular:     Rate and Rhythm: Normal rate and regular rhythm.  Pulmonary:     Effort: Pulmonary  effort is normal.     Breath sounds: Normal breath sounds.  Abdominal:     General: Abdomen is flat. Bowel sounds are normal.     Palpations:  Abdomen is soft.  Skin:    General: Skin is dry.     Findings: No erythema, lesion or rash.  Neurological:     General: No focal deficit present.     Mental Status: She is alert and oriented to person, place, and time.     Sensory: No sensory deficit.     Motor: No weakness.     Coordination: Coordination normal. Finger-Nose-Finger Test normal.     Comments: Lower extremities are without tremor. UE there is tremor to the bilateral UE which is relieved at rest and worse with movement. Seems to be worse on the R side.   Psychiatric:        Mood and Affect: Mood normal.     ED Results / Procedures / Treatments   Labs (all labs ordered are listed, but only abnormal results are displayed) Labs Reviewed  COMPREHENSIVE METABOLIC PANEL - Abnormal; Notable for the following components:      Result Value   Sodium 130 (*)    Chloride 95 (*)    Glucose, Bld 108 (*)    Creatinine, Ser 1.25 (*)    GFR calc non Af Amer 39 (*)    GFR calc Af Amer 45 (*)    All other components within normal limits  URINALYSIS, ROUTINE W REFLEX MICROSCOPIC - Abnormal; Notable for the following components:   Color, Urine STRAW (*)    Specific Gravity, Urine 1.003 (*)    All other components within normal limits  CBC WITH DIFFERENTIAL/PLATELET  CK    EKG EKG Interpretation  Date/Time:  Monday December 06 2019 19:03:56 EST Ventricular Rate:  78 PR Interval:    QRS Duration: 88 QT Interval:  406 QTC Calculation: 463 R Axis:   75 Text Interpretation: Sinus rhythm No STEMI Confirmed by Octaviano Glow [phone]) on 12/06/2019 8:03:29 PM   Radiology CT Head Wo Contrast  Result Date: 12/06/2019 CLINICAL DATA:  Tremors in hands and feet for 1 day, COVID-19 positive 11/12/2019 EXAM: CT HEAD WITHOUT CONTRAST TECHNIQUE: Contiguous axial images were obtained from the base  of the skull through the vertex without intravenous contrast. COMPARISON:  12/05/2016 FINDINGS: Brain: No acute infarct or hemorrhage. Lateral ventricles and midline structures are stable. Dilated perivascular space right basal ganglia again noted. No acute extra-axial fluid collections. No mass effect. Vascular: No hyperdense vessel or unexpected calcification. Skull: Normal. Negative for fracture or focal lesion. Sinuses/Orbits: No acute finding. Other: None IMPRESSION: 1. No acute intracranial process. Electronically Signed   By: Randa Ngo M.D.   On: 12/06/2019 20:05   DG Chest Portable 1 View  Result Date: 12/06/2019 CLINICAL DATA:  Weakness, tremor EXAM: PORTABLE CHEST 1 VIEW COMPARISON:  10/06/2018 FINDINGS: The heart size and mediastinal contours are within normal limits. Both lungs are clear. The visualized skeletal structures are unremarkable. IMPRESSION: No active disease. Electronically Signed   By: Randa Ngo M.D.   On: 12/06/2019 19:31    Procedures Procedures (including critical care time)  Medications Ordered in ED Medications - No data to display  ED Course  I have reviewed the triage vital signs and the nursing notes.  Pertinent labs & imaging results that were available during my care of the patient were reviewed by me and considered in my medical decision making (see chart for details).  Clinical Course as of Dec 05 2244  Mon Dec 06, 2019  2001 87 yo female w/ COVID positive diagnosis in mid January, with tremors in all extremities that developed  around that time.  Reports worsening tremors for past several days and weakness with standing.  Benign exam except for right sided intention tremor.  No nausea, vomiting, diarrhea.   [MT]  2246 Patient work-up is largely negative and the patient appears well and stable.  She was advised on strict return for and and follow-up with her primary care doctor soon.  Also seen and evaluated by Dr. Sunday Shams who agrees with work-up and  plan.   [KM]    Clinical Course User Index [KM] Alveria Apley, PA-C [MT] Wyvonnia Dusky, MD   MDM Rules/Calculators/A&P                      Based on review of vitals, medical screening exam, lab work and/or imaging, there does not appear to be an acute, emergent etiology for the patient's symptoms. Counseled pt on good return precautions and encouraged both PCP and ED follow-up as needed.  Prior to discharge, I also discussed incidental imaging findings with patient in detail and advised appropriate, recommended follow-up in detail.  Clinical Impression: 1. Tremor   2. COVID-19     Disposition: Discharge  Prior to providing a prescription for a controlled substance, I independently reviewed the patient's recent prescription history on the [place]. The patient had no recent or regular prescriptions and was deemed appropriate for a brief, less than 3 day prescription of narcotic for acute analgesia.  This note was prepared with assistance of Systems analyst. Occasional wrong-word or sound-a-like substitutions may have occurred due to the inherent limitations of voice recognition software.  Final Clinical Impression(s) / ED Diagnoses Final diagnoses:  Tremor  COVID-19    Rx / DC Orders ED Discharge Orders    None       Kristine Royal 12/06/19 2246    Wyvonnia Dusky, MD 12/07/19 1332

## 2019-12-06 NOTE — ED Triage Notes (Signed)
Pt here for tremors in her hands, feet, and head that started yesterday. Denies pain. Pt tested positive for covid on 1/15 but was asymptomatic.

## 2019-12-06 NOTE — ED Notes (Signed)
Discharge instructions reviewed with pt. Pt verbalized understanding.   

## 2019-12-06 NOTE — Discharge Instructions (Signed)
Thank you for allowing me to care for you today. Please return to the emergency department if you have new or worsening symptoms. Take your medications as instructed.  ° °

## 2019-12-07 ENCOUNTER — Ambulatory Visit: Payer: Medicare Other | Admitting: Interventional Cardiology

## 2019-12-08 ENCOUNTER — Ambulatory Visit: Payer: Medicare Other | Admitting: Acute Care

## 2019-12-15 ENCOUNTER — Ambulatory Visit: Payer: Medicare Other | Admitting: Orthopaedic Surgery

## 2019-12-15 ENCOUNTER — Encounter: Payer: Medicare Other | Admitting: Physical Medicine and Rehabilitation

## 2019-12-23 NOTE — Progress Notes (Signed)
Cardiology Office Note   Date:  12/24/2019   ID:  Sheryl Curtis, DOB 02-Apr-1932, MRN [id number]  PCP:  Aurea Graff.Marlou Sa, MD    No chief complaint on file.  CAD  Wt Readings from Last 3 Encounters:  12/24/19 202 lb 1.9 oz (91.7 kg)  11/30/19 208 lb (94.3 kg)  11/02/19 208 lb (94.3 kg)       History of Present Illness: Sheryl Curtis is a 86 y.o. female  Who has had CAD and an LAD stent in 2014.  She had a negative monitor for palpitations in the past.  At the Dec 2018 visit, She reported chronic DOE and weight gain since starting steroid injections for her back.   Echo was recommended in June 2019 for [facility] but this did not happen.  SHe had a fall and had some more SHOB.  CXR was recommended but did not happen as well. She fell on her right hip and shoulder.  She could not walk easily for a few weeks.    In the past, she received steroid injections.  She has used CBD for pain relief.    She had COVID in Jan 2021.  She has been recovering.  Not hospitalized.   Denies : Exertional Chest pain. Dizziness. Leg edema. Nitroglycerin use. Orthopnea. Palpitations. Paroxysmal nocturnal dyspnea. Shortness of breath. Syncope.   Occasional twinges- unchanged.  Past Medical History:  Diagnosis Date  . ALLERGIC RHINITIS   . Atrial fibrillation ([place])   . CANCER, COLORECTAL   . CHEST PAIN, ATYPICAL   . CKD (chronic kidney disease)   . DYSPNEA   . Essential hypertension, benign   . FIBROMYALGIA   . G E R D   . History of stress test    Myoview 8/16:  EF 69%, anterior and apical defect c/w breast attenuation; Low Risk   . INSOMNIA   . Morbid obesity ([place])   . Other and unspecified angina pectoris   . Sleep apnea     Past Surgical History:  Procedure Laterality Date  . ABDOMINAL HYSTERECTOMY    . BACK SURGERY    . BREAST SURGERY    . COLON SURGERY    . CORONARY STENT PLACEMENT    . PERCUTANEOUS CORONARY STENT INTERVENTION (PCI-S) N/A 05/19/2013   Procedure:  PERCUTANEOUS CORONARY STENT INTERVENTION (PCI-S);  Surgeon: Jettie Booze, MD;  Location: [facility] CATH LAB;  Service: Cardiovascular;  Laterality: N/A;     Current Outpatient Medications  Medication Sig Dispense Refill  . acetaminophen (TYLENOL) 650 MG CR tablet Take 650 mg by mouth every 8 (eight) hours as needed for pain.    Marland Kitchen albuterol (PROVENTIL) (2.5 MG/3ML) 0.083% nebulizer solution USE 1 VIAL VIA NEBULIZER EVERY 6 HOURS AS NEEDED 1080 mL 1  . Albuterol Sulfate (PROAIR RESPICLICK) 123XX123 (90 Base) MCG/ACT AEPB Inhale 1-2 puffs into the lungs every 6 (six) hours as needed (for shortness of breath and/or wheezing.). 1 each 6  . ALPRAZolam (XANAX) 0.25 MG tablet Take 0.25 mg by mouth 3 (three) times daily as needed for anxiety.    . Ascorbic Acid (VITAMIN C) 1000 MG tablet Take 1,000 mg by mouth daily.    Marland Kitchen atorvastatin (LIPITOR) 40 MG tablet TAKE 1 TABLET(40 MG) BY MOUTH DAILY 90 tablet 3  . budesonide-formoterol (SYMBICORT) 160-4.5 MCG/ACT inhaler Inhale 2 puffs into the lungs 2 (two) times daily. 1 Inhaler 6  . cetirizine (ZYRTEC) 10 MG tablet Take 10 mg by mouth daily.    Marland Kitchen  Cholecalciferol (VITAMIN D) 50 MCG (2000 UT) tablet Take 2,000 Units by mouth daily.    . clopidogrel (PLAVIX) 75 MG tablet TAKE 1 TABLET(75 MG) BY MOUTH EVERY OTHER DAY 45 tablet 2  . cyclobenzaprine (FLEXERIL) 5 MG tablet Take 1 tablet (5 mg total) by mouth 3 (three) times daily as needed for muscle spasms. 30 tablet 0  . DULoxetine (CYMBALTA) 60 MG capsule Take 60 mg by mouth daily.    Marland Kitchen esomeprazole (NEXIUM) 20 MG capsule Take 20 mg by mouth daily.     . furosemide (LASIX) 40 MG tablet Take 1 tablet (40 mg total) by mouth daily. 90 tablet 3  . ipratropium (ATROVENT) 0.03 % nasal spray Place 2 sprays into both nostrils every 12 (twelve) hours. 30 mL 1  . isosorbide mononitrate (IMDUR) 60 MG 24 hr tablet Take 1 tablet (60 mg total) by mouth daily.    Marland Kitchen ketoconazole (NIZORAL) 2 % cream Apply 1 application topically  daily as needed for irritation (rash).     . Nebulizers (COMPRESSOR NEBULIZER) MISC 1 Device by Does not apply route once. 1 each 0  . nitroGLYCERIN (NITROSTAT) 0.4 MG SL tablet Place 0.4 mg under the tongue every 5 (five) minutes as needed for chest pain.    . Omega-3 Fatty Acids (FISH OIL) 1200 MG CAPS Take 1,200 mg by mouth daily.    . polyethylene glycol (MIRALAX / GLYCOLAX) 17 g packet Take 17 g by mouth 2 (two) times daily.    . potassium chloride SA (KLOR-CON) 20 MEQ tablet TAKE 1 TABLET BY MOUTH  DAILY 90 tablet 1  . Respiratory Therapy Supplies (FLUTTER) DEVI Use flutter device 3 times a day 1 each 0  . valsartan-hydrochlorothiazide (DIOVAN-HCT) 160-12.5 MG tablet Take 1 tablet by mouth daily.     Current Facility-Administered Medications  Medication Dose Route Frequency Provider Last Rate Last Admin  . cyclobenzaprine (FLEXERIL) tablet 5 mg  5 mg Oral TID PRN Raulkar, Clide Deutscher, MD        Allergies:   Aspirin, Hydroxyquinolines, Imipramine hcl, Pamelor [nortriptyline hcl], Prednisone, Pregabalin, Procardia [nifedipine], Tramadol, Ace inhibitors, Influenza vaccines, and Plaquenil [hydroxychloroquine sulfate]    Social History:  The patient  reports that she quit smoking about 57 years ago. Her smoking use included cigarettes. She started smoking about 70 years ago. She has a 12.00 pack-year smoking history. She has never used smokeless tobacco. She reports that she does not drink alcohol or use drugs.   Family History:  The patient's family history includes Bone cancer in her sister; CAD in her brother; Heart attack (age of onset: 67) in her brother; Throat cancer in her daughter.    ROS:  Please see the history of present illness.   Otherwise, review of systems are positive for joint pains, fatigue.   All other systems are reviewed and negative.    PHYSICAL EXAM: VS:  BP 136/60   Pulse 80   Ht 5\' 4"  (1.626 m)   Wt 202 lb 1.9 oz (91.7 kg)   SpO2 96%   BMI 34.69 kg/m  , BMI  Body mass index is 34.69 kg/m. GEN: Well nourished, well developed, in no acute distress  HEENT: normal  Neck: no JVD, carotid bruits, or masses Cardiac: RRR; no murmurs, rubs, or gallops,no edema  Respiratory:  clear to auscultation bilaterally, normal work of breathing GI: soft, nontender, nondistended, + BS MS: no deformity or atrophy  Skin: warm and dry, no rash Neuro:  Strength and sensation are  intact Psych: euthymic mood, full affect   EKG:   The ekg ordered 11/2019 demonstrates NSR, no ST changes   Recent Labs: 12/06/2019: ALT 17; BUN 18; Creatinine, Ser 1.25; Hemoglobin 13.3; Platelets 242; Potassium 3.8; Sodium 130   Lipid Panel    Component Value Date/Time   CHOL 177 05/13/2019 1005   TRIG 112 05/13/2019 1005   HDL 51 05/13/2019 1005   CHOLHDL 3.5 05/13/2019 1005   LDLCALC 104 (H) 05/13/2019 1005     Other studies Reviewed: Additional studies/ records that were reviewed today with results demonstrating: 2019 echo showed normal LV function, normal valvular function.  Results from pulmonary reviewed in regards to her respiratory infections including Covid.   ASSESSMENT AND PLAN:   1. CAD: She has had intermittent chest discomfort in the past. 2. HTN: The current medical regimen is effective;  continue present plan and medications.  Refill SL NTG.  3. SHOB: REcent COVID infection.  She has had issues with deconditioning in the past.   4. Hyponatremia: Noted on 12/06/19.  BMet today.  5. Hyperlipidemia: LDL 104. Rosuvastatin 20 mg daily.    Current medicines are reviewed at length with the patient today.  The patient concerns regarding her medicines were addressed.  The following changes have been made:  Try rosuvastatin  Labs/ tests ordered today include:  No orders of the defined types were placed in this encounter.   Recommend 150 minutes/week of aerobic exercise Low fat, low carb, high fiber diet recommended  Disposition:   FU in 1  year   Signed, Larae Grooms, MD  12/24/2019 3:41 PM    [facility] [address], [place], [place]  [address] Phone: [phone]; Fax: [phone]

## 2019-12-24 ENCOUNTER — Other Ambulatory Visit: Payer: Self-pay

## 2019-12-24 ENCOUNTER — Other Ambulatory Visit: Payer: Self-pay | Admitting: Interventional Cardiology

## 2019-12-24 ENCOUNTER — Encounter: Payer: Self-pay | Admitting: Interventional Cardiology

## 2019-12-24 ENCOUNTER — Ambulatory Visit: Payer: Medicare Other | Admitting: Interventional Cardiology

## 2019-12-24 VITALS — BP 136/60 | HR 80 | Ht 64.0 in | Wt 202.1 lb

## 2019-12-24 DIAGNOSIS — E782 Mixed hyperlipidemia: Secondary | ICD-10-CM | POA: Diagnosis not present

## 2019-12-24 DIAGNOSIS — I5032 Chronic diastolic (congestive) heart failure: Secondary | ICD-10-CM | POA: Diagnosis not present

## 2019-12-24 DIAGNOSIS — I25119 Atherosclerotic heart disease of native coronary artery with unspecified angina pectoris: Secondary | ICD-10-CM | POA: Diagnosis not present

## 2019-12-24 DIAGNOSIS — I10 Essential (primary) hypertension: Secondary | ICD-10-CM | POA: Diagnosis not present

## 2019-12-24 DIAGNOSIS — G4733 Obstructive sleep apnea (adult) (pediatric): Secondary | ICD-10-CM

## 2019-12-24 MED ORDER — NITROGLYCERIN 0.4 MG SL SUBL: 0.4000 mg | SUBLINGUAL_TABLET | SUBLINGUAL | 3 refills | Status: DC | PRN

## 2019-12-24 MED ORDER — ROSUVASTATIN CALCIUM 20 MG PO TABS: 20.0000 mg | ORAL_TABLET | Freq: Every day | ORAL | 3 refills | Status: DC

## 2019-12-24 NOTE — Patient Instructions (Signed)
Medication Instructions:  Your physician has recommended you make the following change in your medication:   1. STOP: atorvastatin (lipitor)  2. START: rosuvastatin (crestor) 20 mg tablet: Take 1 tablet by mouth once a day  3. A refill was sent in for your Sublingual Nitroglycerin  *If you need a refill on your cardiac medications before your next appointment, please call your pharmacy*   Lab Work: TODAY: BMET  If you have labs (blood work) drawn today and your tests are completely normal, you will receive your results only by: Marland Kitchen MyChart Message (if you have MyChart) OR . A paper copy in the mail If you have any lab test that is abnormal or we need to change your treatment, we will call you to review the results.   Testing/Procedures: None ordered   Follow-Up: At [facility], you and your health needs are our priority.  As part of our continuing mission to provide you with exceptional heart care, we have created designated Provider Care Teams.  These Care Teams include your primary Cardiologist (physician) and Advanced Practice Providers (APPs -  Physician Assistants and Nurse Practitioners) who all work together to provide you with the care you need, when you need it.  We recommend signing up for the patient portal called "MyChart".  Sign up information is provided on this After Visit Summary.  MyChart is used to connect with patients for Virtual Visits (Telemedicine).  Patients are able to view lab/test results, encounter notes, upcoming appointments, etc.  Non-urgent messages can be sent to your provider as well.   To learn more about what you can do with MyChart, go to [email or web address].    Your next appointment:   12 month(s)  The format for your next appointment:   In Person  Provider:   You may see Larae Grooms, MD or one of the following Advanced Practice Providers on your designated Care Team:    Melina Copa, PA-C  Ermalinda Barrios, PA-C    Other  Instructions

## 2019-12-25 LAB — BASIC METABOLIC PANEL
BUN/Creatinine Ratio: 17 (ref 12–28)
BUN: 20 mg/dL (ref 8–27)
CO2: 20 mmol/L (ref 20–29)
Calcium: 9.7 mg/dL (ref 8.7–10.3)
Chloride: 98 mmol/L (ref 96–106)
Creatinine, Ser: 1.19 mg/dL — ABNORMAL HIGH (ref 0.57–1.00)
GFR calc Af Amer: 48 mL/min/{1.73_m2} — ABNORMAL LOW (ref >59)
GFR calc non Af Amer: 42 mL/min/{1.73_m2} — ABNORMAL LOW (ref >59)
Glucose: 102 mg/dL — ABNORMAL HIGH (ref 65–99)
Potassium: 4.1 mmol/L (ref 3.5–5.2)
Sodium: 136 mmol/L (ref 134–144)

## 2019-12-27 ENCOUNTER — Telehealth: Payer: Self-pay | Admitting: Pulmonary Disease

## 2019-12-27 NOTE — Telephone Encounter (Signed)
Called and spoke with pt's daughter [name] letting her know that there is a patient assistance program that we could see if she could be enrolled in for her symbicort and I could mail the applicaton to her. [name] verbalized understanding. appication has been printed and pt's part has been placed in mail for her. The prescriber's part has been placed in RA's box for him to sign. Nothing further needed.

## 2019-12-28 ENCOUNTER — Encounter: Payer: Self-pay | Admitting: Physical Medicine and Rehabilitation

## 2019-12-28 ENCOUNTER — Other Ambulatory Visit: Payer: Self-pay

## 2019-12-28 ENCOUNTER — Encounter
Payer: Medicare Other | Attending: Physical Medicine and Rehabilitation | Admitting: Physical Medicine and Rehabilitation

## 2019-12-28 VITALS — BP 123/74 | HR 87 | Temp 97.9°F | Ht 64.0 in | Wt 204.0 lb

## 2019-12-28 DIAGNOSIS — M797 Fibromyalgia: Secondary | ICD-10-CM | POA: Insufficient documentation

## 2019-12-28 DIAGNOSIS — Z79891 Long term (current) use of opiate analgesic: Secondary | ICD-10-CM | POA: Diagnosis not present

## 2019-12-28 DIAGNOSIS — G8929 Other chronic pain: Secondary | ICD-10-CM | POA: Diagnosis present

## 2019-12-28 DIAGNOSIS — G894 Chronic pain syndrome: Secondary | ICD-10-CM

## 2019-12-28 DIAGNOSIS — Z5181 Encounter for therapeutic drug level monitoring: Secondary | ICD-10-CM

## 2019-12-28 DIAGNOSIS — G4701 Insomnia due to medical condition: Secondary | ICD-10-CM

## 2019-12-28 DIAGNOSIS — L299 Pruritus, unspecified: Secondary | ICD-10-CM

## 2019-12-28 DIAGNOSIS — M25512 Pain in left shoulder: Secondary | ICD-10-CM | POA: Diagnosis present

## 2019-12-28 MED ORDER — DIPHENHYDRAMINE HCL 25 MG PO CAPS: 25.0000 mg | ORAL_CAPSULE | Freq: Every evening | ORAL | 0 refills | Status: DC | PRN

## 2019-12-28 MED ORDER — METAXALONE 800 MG PO TABS: 800.0000 mg | ORAL_TABLET | Freq: Three times a day (TID) | ORAL | 1 refills | Status: DC

## 2019-12-28 NOTE — Progress Notes (Signed)
Subjective:    Patient ID: Sheryl Curtis, female    DOB: 07-19-1932, 86 y.o.   MRN: [id number]  HPI 11/02/19: [name] presents for phone follow-up of her fibromyalgia, chronic left shoulder pain, cervical spondylosis, She has pains in her bilateral shoulders, neck, bilateral hips, knees, ankles, and low back. She has been diagnosed with fibromyalgia, osteoarthritis, and rheumatoid arthritis. Her quality of life is greatly diminished and she mostly watches TV and uses her ipad. Once in a while she ambulates around her home with assistive device or ambulates outside, but she finds it too cold now and she is in too much pain. She finds the Tramadol does not help her. She believes she was on 50mg  ofLyrica in the past without benefit.Last visit I prescribed 50mg  BID but she did not find relief with this. She did not experience side effects. She asks whether the Lyrica will also help with her OA and RA and notes that her shoulder pain has been worsening.  2/2: [name] recently was diagnosed with COVID and has been quarantined at home. She does not feel SOB but does feel very weak. Her daughter got COVID first, requiring admission to the hospital. She was recently discharged and returned home to help care for her mother. She also provides history during this phone call. Her pain has continued to be severe, although she does find relief from the Lyrica. It does not make her too sleepy. She would also like to try a muscle relaxer for her muscle spasms. Her pains continue to be diffuse and worst in her neck, shoulders, and lower back. She is able to ambulate around her home but has been unable to be more mobile due to her COVID-related weakness.   3/2: Dose escalation of Lyrica may have been too quick for her and she developed tremor. PCP thought may be secondary to Lyrica and she was sent to ED. Has recovered well and is no longer taking lyrica. Continues to have severe pain that is affecting her  quality of life. Daughter accompanies her again today.   She still takes the Flexiril as needed for muscle spasm but it can make her sleepy during the day. She has never tried Air traffic controller and would like to try.  She has diffuse itching and no rash. Has been taking Hydroxyzine without relief. Also sleeping poorly at night. Has not started any new medications recently or tried any new detergents.    Pain Inventory Average Pain 7 Pain Right Now 4 My pain is intermittent, constant, sharp, burning, dull, stabbing, tingling and aching  In the last 24 hours, has pain interfered with the following? General activity 5 Relation with others 6 Enjoyment of life 5 What TIME of day is your pain at its worst? daytime Sleep (in general) Good  Pain is worse with: walking, standing and some activites Pain improves with: rest and medication Relief from Meds: 1  Mobility walk with assistance use a cane use a walker ability to climb steps?  no do you drive?  no use a wheelchair transfers alone  Function retired  Neuro/Psych weakness trouble walking  Prior Studies Any changes since last visit?  no  Physicians involved in your care Any changes since last visit?  no   Family History  Problem Relation Age of Onset  . CAD Brother   . Heart attack Brother 23  . Bone cancer Sister   . Throat cancer Daughter    Social History   Socioeconomic History  .  Marital status: Widowed    Spouse name: Not on file  . Number of children: 4  . Years of education: Not on file  . Highest education level: Not on file  Occupational History  . Not on file  Tobacco Use  . Smoking status: Former Smoker    Packs/day: 1.00    Years: 12.00    Pack years: 12.00    Types: Cigarettes    Start date: 10/28/1949    Quit date: 10/28/1962    Years since quitting: 57.2  . Smokeless tobacco: Never Used  Substance and Sexual Activity  . Alcohol use: No  . Drug use: No  . Sexual activity: Not on file  Other  Topics Concern  . Not on file  Social History Narrative  . Not on file   Social Determinants of Health   Financial Resource Strain: Low Risk   . Difficulty of Paying Living Expenses: Not hard at all  Food Insecurity: No Food Insecurity  . Worried About Charity fundraiser in the Last Year: Never true  . Ran Out of Food in the Last Year: Never true  Transportation Needs: No Transportation Needs  . Lack of Transportation (Medical): No  . Lack of Transportation (Non-Medical): No  Physical Activity: Inactive  . Days of Exercise per Week: 0 days  . Minutes of Exercise per Session: 0 min  Stress: No Stress Concern Present  . Feeling of Stress : Only a little  Social Connections: Somewhat Isolated  . Frequency of Communication with Friends and Family: More than three times a week  . Frequency of Social Gatherings with Friends and Family: More than three times a week  . Attends Religious Services: 1 to 4 times per year  . Active Member of Clubs or Organizations: No  . Attends Archivist Meetings: Never  . Marital Status: Widowed   Past Surgical History:  Procedure Laterality Date  . ABDOMINAL HYSTERECTOMY    . BACK SURGERY    . BREAST SURGERY    . COLON SURGERY    . CORONARY STENT PLACEMENT    . PERCUTANEOUS CORONARY STENT INTERVENTION (PCI-S) N/A 05/19/2013   Procedure: PERCUTANEOUS CORONARY STENT INTERVENTION (PCI-S);  Surgeon: Jettie Booze, MD;  Location: [facility] CATH LAB;  Service: Cardiovascular;  Laterality: N/A;   Past Medical History:  Diagnosis Date  . ALLERGIC RHINITIS   . Atrial fibrillation ([place])   . CANCER, COLORECTAL   . CHEST PAIN, ATYPICAL   . CKD (chronic kidney disease)   . DYSPNEA   . Essential hypertension, benign   . FIBROMYALGIA   . G E R D   . History of stress test    Myoview 8/16:  EF 69%, anterior and apical defect c/w breast attenuation; Low Risk   . INSOMNIA   . Morbid obesity ([place])   . Other and unspecified angina pectoris   .  Sleep apnea    There were no vitals taken for this visit.  Opioid Risk Score:   Fall Risk Score:  `1  Depression screen PHQ 2/9  Depression screen [facility] 2/9 10/25/2019 07/22/2019 04/21/2019 01/13/2019 01/04/2019 10/29/2018 08/28/2015  Decreased Interest 0 0 0 1 0 0 0  Down, Depressed, Hopeless 0 0 0 1 0 0 0  PHQ - 2 Score 0 0 0 2 0 0 0  Altered sleeping - - - 1 - - -  Tired, decreased energy - - - 1 - - -  Change in appetite - - - 0 - - -  Feeling bad or failure about yourself  - - - 1 - - -  Trouble concentrating - - - 1 - - -  Moving slowly or fidgety/restless - - - 0 - - -  Suicidal thoughts - - - 0 - - -  PHQ-9 Score - - - 6 - - -  Difficult doing work/chores - - - Somewhat difficult - - -  Some recent data might be hidden    Review of Systems  Musculoskeletal: Positive for arthralgias, back pain and gait problem.  Neurological: Positive for weakness.  All other systems reviewed and are negative.      Objective:   Physical Exam Gen: no distress, normal appearing HEENT: oral mucosa pink and moist, NCAT Cardio: Reg rate Chest: normal effort, normal rate of breathing Abd: soft, non-distended Ext: no edema Skin: intact Neuro: AOx3 Musculoskeletal: Diffusely weak without focal deficits. FROM of lumbar spine in flexion and extension with pain on extension in lower lumbar spine. Tender points along upper back and neck muscles. Lateral rotation limited to 25 degrees bilaterally with pain at end range. Protracted posture. Able to get out of wheelchair to use bathroom with MinA from daughter.  Psych: depressed mood       Assessment & Plan:  Mrs. Breeza Ferrario presents with diffuse pains. She has pains in her bilateral shoulders, neck, bilateral hips, knees, ankles, and low back.  1) Fibromyalgia: --discontinued Lyrica given side effects of tremor.  --prescribed Skelaxin for non-sedating muscle spasm control. If does not help, will consider Tramadol. Urine sample obtained and  pain contract signed.  --Discussed that out goal will be to provide pain relief so we can increase her mobility, as exercise is a crucial component of fibromyalgia treatment.Currently her mobility is very limited secondary to her COVID-related weakness.   2) Chronic left shoulder pain: I have personally reviewed XR; within normal limits.   3) Itching -source unknown; denies new medication or detergent. Will prescribe Benadryl 25mg  to help with both itching and insomnia. Advised regarding risk of sedation.   All questions were encouraged and answered. Follow up with me in4 weeks.

## 2019-12-29 ENCOUNTER — Telehealth: Payer: Self-pay

## 2019-12-29 NOTE — Telephone Encounter (Signed)
Patient called and stated that a cream was prescribed to her at last visit and when she went to pick it up from the pharmacy it was over $500.

## 2019-12-31 ENCOUNTER — Telehealth: Payer: Self-pay

## 2019-12-31 NOTE — Telephone Encounter (Signed)
Prior auth started for this patient on 12-28-2019.  Came back the same day as being denied.  Appeal started today 12-31-2019 and submitted for expiated review with a turn-around time of 72hrs.

## 2020-01-01 LAB — DRUG TOX MONITOR 1 W/CONF, ORAL FLD

## 2020-01-01 LAB — DRUG TOX ALC METAB W/CON, ORAL FLD: Alcohol Metabolite: NEGATIVE ng/mL (ref <25)

## 2020-01-06 ENCOUNTER — Other Ambulatory Visit: Payer: Self-pay | Admitting: Physician Assistant

## 2020-01-14 ENCOUNTER — Other Ambulatory Visit: Payer: Self-pay | Admitting: Physical Medicine and Rehabilitation

## 2020-01-14 MED ORDER — METAXALONE 800 MG PO TABS: 800.0000 mg | ORAL_TABLET | Freq: Three times a day (TID) | ORAL | 1 refills | Status: DC

## 2020-01-17 ENCOUNTER — Telehealth: Payer: Self-pay | Admitting: Pulmonary Disease

## 2020-01-17 NOTE — Telephone Encounter (Signed)
Paper work is with [facility] CMA who is working with Dr. Elsworth Soho, she will address on Tuesday 01/18/20 with him in office.

## 2020-01-18 NOTE — Telephone Encounter (Signed)
Paperwork was given to Raquel Sarna so faxed it with other papers. Nothing further needed at this time.

## 2020-01-20 ENCOUNTER — Other Ambulatory Visit: Payer: Self-pay | Admitting: *Deleted

## 2020-01-20 NOTE — Patient Outreach (Signed)
[facility]) Care Management  01/20/2020  Sheryl Curtis Jun 17, 1932 [id number]   RN Health Coach attempted follow up outreach call to patient.  Patient was unavailable. HIPPA compliance voicemail message left with return callback number.  Plan: RN will call patient again within 30 days.  [place] Care Management [phone]

## 2020-01-21 ENCOUNTER — Other Ambulatory Visit: Payer: Self-pay | Admitting: *Deleted

## 2020-01-21 NOTE — Patient Outreach (Signed)
[facility]) Care Management  [place]  01/21/2020   Sheryl Curtis June 23, 1932 [id number]  RN Health Coach telephone call to patient.  [employer] compliance verified. Per patient she is not feeling good today. Patient stated she is having a lot of pain and stiffness in her neck. Patient has been using heat with no relief.Patient stated that she  also has pain in her shoulder all the way down to her elbows. Per patient she is not driving much. Her daughter now takes her to all her appointments. Patient appetite is fair. She eats 2 large meals a day. Per patient when she stands up she has a lot of discomfort. She has not had any recent falls. She uses a cane to ambulate. She stated that she is having itching across chest , head and stomach. She is not sure which medication could possibly be causing this. She has not been feeling good since she had COVID. Patient has not had the COVID vaccine at this time and is refusing at this time.  Patient monitors her blood pressure at random times. She stated she can tell when it is elevated because herr cheeks redden. Patient has agreed to further outreach calls.   Encounter Medications:  Outpatient Encounter Medications as of 01/21/2020  Medication Sig  . acetaminophen (TYLENOL) 650 MG CR tablet Take 650 mg by mouth every 8 (eight) hours as needed for pain.  Marland Kitchen albuterol (PROVENTIL) (2.5 MG/3ML) 0.083% nebulizer solution USE 1 VIAL VIA NEBULIZER EVERY 6 HOURS AS NEEDED  . Albuterol Sulfate (PROAIR RESPICLICK) 664 (90 Base) MCG/ACT AEPB Inhale 1-2 puffs into the lungs every 6 (six) hours as needed (for shortness of breath and/or wheezing.).  Marland Kitchen ALPRAZolam (XANAX) 0.25 MG tablet Take 0.25 mg by mouth 3 (three) times daily as needed for anxiety.  . budesonide-formoterol (SYMBICORT) 160-4.5 MCG/ACT inhaler Inhale 2 puffs into the lungs 2 (two) times daily.  . DULoxetine (CYMBALTA) 60 MG capsule Take 60 mg by mouth daily.  . furosemide (LASIX)  40 MG tablet Take 1 tablet (40 mg total) by mouth daily.  Marland Kitchen ipratropium (ATROVENT) 0.03 % nasal spray Place 2 sprays into both nostrils every 12 (twelve) hours.  . isosorbide mononitrate (IMDUR) 60 MG 24 hr tablet TAKE 1 TABLET BY MOUTH  DAILY  . Nebulizers (COMPRESSOR NEBULIZER) MISC 1 Device by Does not apply route once.  . valsartan-hydrochlorothiazide (DIOVAN-HCT) 160-12.5 MG tablet Take 1 tablet by mouth daily.  . Ascorbic Acid (VITAMIN C) 1000 MG tablet Take 1,000 mg by mouth daily.  . cetirizine (ZYRTEC) 10 MG tablet Take 10 mg by mouth daily.  . Cholecalciferol (VITAMIN D) 50 MCG (2000 UT) tablet Take 2,000 Units by mouth daily.  . clopidogrel (PLAVIX) 75 MG tablet TAKE 1 TABLET(75 MG) BY MOUTH EVERY OTHER DAY  . cyclobenzaprine (FLEXERIL) 5 MG tablet Take 1 tablet (5 mg total) by mouth 3 (three) times daily as needed for muscle spasms.  . diphenhydrAMINE (BENADRYL ALLERGY) 25 mg capsule Take 1 capsule (25 mg total) by mouth at bedtime as needed.  Marland Kitchen esomeprazole (NEXIUM) 20 MG capsule Take 20 mg by mouth daily.   . hydrOXYzine (ATARAX/VISTARIL) 25 MG tablet Take 25 mg by mouth 3 (three) times daily as needed.  Marland Kitchen ketoconazole (NIZORAL) 2 % cream Apply 1 application topically daily as needed for irritation (rash).   . metaxalone (SKELAXIN) 800 MG tablet Take 1 tablet (800 mg total) by mouth 3 (three) times daily.  . nitroGLYCERIN (NITROSTAT) 0.4 MG  SL tablet Place 1 tablet (0.4 mg total) under the tongue every 5 (five) minutes as needed for chest pain.  . Omega-3 Fatty Acids (FISH OIL) 1200 MG CAPS Take 1,200 mg by mouth daily.  . polyethylene glycol (MIRALAX / GLYCOLAX) 17 g packet Take 17 g by mouth 2 (two) times daily.  . potassium chloride SA (KLOR-CON) 20 MEQ tablet TAKE 1 TABLET BY MOUTH  DAILY  . Respiratory Therapy Supplies (FLUTTER) DEVI Use flutter device 3 times a day  . rosuvastatin (CRESTOR) 20 MG tablet Take 1 tablet (20 mg total) by mouth daily.   No facility-administered  encounter medications on file as of 01/21/2020.    Functional Status:  In your present state of health, do you have any difficulty performing the following activities: 10/25/2019 07/22/2019  Hearing? [name]  Vision? N -  Difficulty concentrating or making decisions? N N  Walking or climbing stairs? Y Y  Comment due to arthritis and pain arthritis and back painn radiating down leg  Dressing or bathing? N N  Doing errands, shopping? [name]  Comment daughter [name] runs all errands daughter [name] assists  Conservation officer, nature and eating ? N N  Using the Toilet? N N  In the past six months, have you accidently leaked urine? Y Y  Do you have problems with loss of bowel control? N N  Managing your Medications? N N  Managing your Finances? N N  Housekeeping or managing your Housekeeping? [name] helps out daughters help out  Some recent data might be hidden    Fall/Depression Screening: Fall Risk  01/21/2020 11/30/2019 10/25/2019  Falls in the past year? 1 0 1  Comment - - -  Number falls in past yr: 1 - 1  Injury with Fall? 1 - 1  Comment - - hurt shoulder  Risk Factor Category  - - -  Risk for fall due to : History of fall(s);Impaired balance/gait;Impaired mobility - History of fall(s);Impaired balance/gait;Impaired mobility  Risk for fall due to: Comment - - -  Follow up Falls evaluation completed;Falls prevention discussed - Falls evaluation completed;Education provided;Falls prevention discussed  Comment - - -   PHQ 2/9 Scores 01/21/2020 10/25/2019 07/22/2019 04/21/2019 01/13/2019 01/04/2019 10/29/2018  PHQ - 2 Score 2 0 0 0 2 0 0  PHQ- 9 Score 5 - - - 6 - -  Exception Documentation - - - - Patient refusal - -   THN CM Care Plan Problem One     Most Recent Value  Care Plan Problem One  Knowledge deficit in self management of hypertension  Role Documenting the Problem One  [place] for Problem One  Active  THN Long Term Goal   Patient will have a bettter understanding of  foods low in sodium in  90 days  THN Long Term Goal Met Date  01/21/20  [facility] CM Short Term Goal #1   Patient will not have any falls within the next 30 days  Interventions for Short Term Goal #1  RN discussed fall prevention. Patient has not had a recent fall but is very weak and not getting up much walking since COVID. RN will follow up on patient progress.  THN CM Short Term Goal #2   Patient will verbalize checking blood pressure at least 3x week within the next 30 days  Interventions for Short Term Goal #2  RN discussed how often the patient is checking her blood pressures. The patientis still only  checking when she feels symptomatic. RN reiterates that she needs to check also when not showing the symptoms to make sure it is not elevated and she is asymptomatic. RN will  follow up on progreass.  THN CM Short Term Goal #3  patient will verbalize the importance of following up with Health Maintenance within the next 30 days  THN CM Short Term Goal #3 Start Date  01/21/20  Interventions for Short Tern Goal #3  RN discussed with patient about Helth maintenance.RN discussed with patient about getting the COVID vaccine. RN made patient aware she would help with scheduling if needed. RN discussed eye care. RN will follow up with further discussion     . Assessment:  Patient is sedentary Patient is not exercising Patient is complaining of stiffness in neck Pain in shoulders radiating to elbow Checks blood pressure randomly Per patient she is taking all medications as prescribed Patient c/o itching across chest, head and stomach Plan:  Referred to Pharmacy RN sent Dr request for Home physical therapy RN discussed the COVID vaccine RN discussed medication adherence RN discussed adhering to low sodium diet RN will follow up outreach within the month of May  Kamee [facility]st [facility] [phone]

## 2020-01-26 ENCOUNTER — Other Ambulatory Visit: Payer: Self-pay | Admitting: Physical Medicine and Rehabilitation

## 2020-01-26 MED ORDER — ACETAMINOPHEN-CODEINE #3 300-30 MG PO TABS: 1.0000 | ORAL_TABLET | Freq: Two times a day (BID) | ORAL | 0 refills | Status: DC | PRN

## 2020-01-27 ENCOUNTER — Emergency Department (HOSPITAL_COMMUNITY): Payer: Medicare Other

## 2020-01-27 ENCOUNTER — Other Ambulatory Visit: Payer: Self-pay

## 2020-01-27 ENCOUNTER — Encounter (HOSPITAL_COMMUNITY): Payer: Self-pay | Admitting: Emergency Medicine

## 2020-01-27 ENCOUNTER — Emergency Department (HOSPITAL_COMMUNITY)
Admission: EM | Admit: 2020-01-27 | Discharge: 2020-01-27 | Disposition: A | Discharge status: Home / Self Care | Payer: Medicare Other | Attending: Emergency Medicine | Admitting: Emergency Medicine

## 2020-01-27 DIAGNOSIS — I129 Hypertensive chronic kidney disease with stage 1 through stage 4 chronic kidney disease, or unspecified chronic kidney disease: Secondary | ICD-10-CM | POA: Diagnosis not present

## 2020-01-27 DIAGNOSIS — R410 Disorientation, unspecified: Secondary | ICD-10-CM | POA: Diagnosis not present

## 2020-01-27 DIAGNOSIS — I4891 Unspecified atrial fibrillation: Secondary | ICD-10-CM | POA: Diagnosis not present

## 2020-01-27 DIAGNOSIS — R519 Headache, unspecified: Secondary | ICD-10-CM | POA: Insufficient documentation

## 2020-01-27 DIAGNOSIS — R0789 Other chest pain: Secondary | ICD-10-CM | POA: Insufficient documentation

## 2020-01-27 DIAGNOSIS — I2510 Atherosclerotic heart disease of native coronary artery without angina pectoris: Secondary | ICD-10-CM | POA: Diagnosis not present

## 2020-01-27 DIAGNOSIS — M542 Cervicalgia: Secondary | ICD-10-CM | POA: Diagnosis not present

## 2020-01-27 DIAGNOSIS — R531 Weakness: Secondary | ICD-10-CM | POA: Insufficient documentation

## 2020-01-27 DIAGNOSIS — I10 Essential (primary) hypertension: Secondary | ICD-10-CM

## 2020-01-27 DIAGNOSIS — Z79899 Other long term (current) drug therapy: Secondary | ICD-10-CM | POA: Diagnosis not present

## 2020-01-27 DIAGNOSIS — N183 Chronic kidney disease, stage 3 unspecified: Secondary | ICD-10-CM | POA: Insufficient documentation

## 2020-01-27 LAB — CBC WITH DIFFERENTIAL/PLATELET
Abs Immature Granulocytes: 0.04 10*3/uL (ref 0.00–0.07)
Basophils Absolute: 0.1 10*3/uL (ref 0.0–0.1)
Basophils Relative: 1 %
Eosinophils Absolute: 0.2 10*3/uL (ref 0.0–0.5)
Eosinophils Relative: 2 %
HCT: 40.6 % (ref 36.0–46.0)
Hemoglobin: 13 g/dL (ref 12.0–15.0)
Immature Granulocytes: 0 %
Lymphocytes Relative: 19 %
Lymphs Abs: 1.9 10*3/uL (ref 0.7–4.0)
MCH: 30.7 pg (ref 26.0–34.0)
MCHC: 32 g/dL (ref 30.0–36.0)
MCV: 96 fL (ref 80.0–100.0)
Monocytes Absolute: 1 10*3/uL (ref 0.1–1.0)
Monocytes Relative: 10 %
Neutro Abs: 6.8 10*3/uL (ref 1.7–7.7)
Neutrophils Relative %: 68 %
Platelets: 272 10*3/uL (ref 150–400)
RBC: 4.23 MIL/uL (ref 3.87–5.11)
RDW: 14.1 % (ref 11.5–15.5)
WBC: 10 10*3/uL (ref 4.0–10.5)
nRBC: 0 % (ref 0.0–0.2)

## 2020-01-27 LAB — I-STAT CHEM 8, ED
BUN: 22 mg/dL (ref 8–23)
Calcium, Ion: 1.19 mmol/L (ref 1.15–1.40)
Chloride: 98 mmol/L (ref 98–111)
Creatinine, Ser: 1.2 mg/dL — ABNORMAL HIGH (ref 0.44–1.00)
Glucose, Bld: 104 mg/dL — ABNORMAL HIGH (ref 70–99)
HCT: 37 % (ref 36.0–46.0)
Hemoglobin: 12.6 g/dL (ref 12.0–15.0)
Potassium: 3.2 mmol/L — ABNORMAL LOW (ref 3.5–5.1)
Sodium: 137 mmol/L (ref 135–145)
TCO2: 32 mmol/L (ref 22–32)

## 2020-01-27 LAB — URINALYSIS, ROUTINE W REFLEX MICROSCOPIC
Bilirubin Urine: NEGATIVE
Glucose, UA: NEGATIVE mg/dL
Hgb urine dipstick: NEGATIVE
Ketones, ur: NEGATIVE mg/dL
Leukocytes,Ua: NEGATIVE
Nitrite: NEGATIVE
Protein, ur: NEGATIVE mg/dL
Specific Gravity, Urine: 1.004 — ABNORMAL LOW (ref 1.005–1.030)
pH: 7 (ref 5.0–8.0)

## 2020-01-27 LAB — I-STAT BETA HCG BLOOD, ED (MC, WL, AP ONLY): I-stat hCG, quantitative: <5 m[IU]/mL (ref <5)

## 2020-01-27 LAB — COMPREHENSIVE METABOLIC PANEL
ALT: 14 U/L (ref 0–44)
AST: 17 U/L (ref 15–41)
Albumin: 3.9 g/dL (ref 3.5–5.0)
Alkaline Phosphatase: 41 U/L (ref 38–126)
Anion gap: 8 (ref 5–15)
BUN: 20 mg/dL (ref 8–23)
CO2: 29 mmol/L (ref 22–32)
Calcium: 9.1 mg/dL (ref 8.9–10.3)
Chloride: 99 mmol/L (ref 98–111)
Creatinine, Ser: 1.11 mg/dL — ABNORMAL HIGH (ref 0.44–1.00)
GFR calc Af Amer: 52 mL/min — ABNORMAL LOW (ref >60)
GFR calc non Af Amer: 45 mL/min — ABNORMAL LOW (ref >60)
Glucose, Bld: 111 mg/dL — ABNORMAL HIGH (ref 70–99)
Potassium: 3.3 mmol/L — ABNORMAL LOW (ref 3.5–5.1)
Sodium: 136 mmol/L (ref 135–145)
Total Bilirubin: 0.5 mg/dL (ref 0.3–1.2)
Total Protein: 7.2 g/dL (ref 6.5–8.1)

## 2020-01-27 LAB — TROPONIN I (HIGH SENSITIVITY): Troponin I (High Sensitivity): 7 ng/L (ref <18)

## 2020-01-27 MED ORDER — ACETAMINOPHEN 325 MG PO TABS
650.0000 mg | ORAL_TABLET | Freq: Once | ORAL | Status: AC
  Administered 2020-01-27: 650 mg via ORAL
  Filled 2020-01-27: qty 2

## 2020-01-27 MED ORDER — LABETALOL HCL 5 MG/ML IV SOLN
5.0000 mg | Freq: Once | INTRAVENOUS | Status: AC
  Administered 2020-01-27: 5 mg via INTRAVENOUS
  Filled 2020-01-27: qty 4

## 2020-01-27 MED ORDER — LORAZEPAM 2 MG/ML IJ SOLN
0.5000 mg | Freq: Once | INTRAMUSCULAR | Status: AC
  Administered 2020-01-27: 0.5 mg via INTRAVENOUS
  Filled 2020-01-27: qty 1

## 2020-01-27 MED ORDER — IOHEXOL 350 MG/ML SOLN
75.0000 mL | Freq: Once | INTRAVENOUS | Status: AC | PRN
  Administered 2020-01-27: 75 mL via INTRAVENOUS

## 2020-01-27 MED ORDER — SODIUM CHLORIDE (PF) 0.9 % IJ SOLN
INTRAMUSCULAR | Status: AC
  Filled 2020-01-27: qty 50

## 2020-01-27 MED ORDER — METOCLOPRAMIDE HCL 5 MG/ML IJ SOLN
5.0000 mg | Freq: Once | INTRAMUSCULAR | Status: AC
  Administered 2020-01-27: 5 mg via INTRAVENOUS
  Filled 2020-01-27: qty 2

## 2020-01-27 NOTE — ED Notes (Signed)
Pt transported to MRI 

## 2020-01-27 NOTE — ED Provider Notes (Signed)
[place] DEPT Provider Note   CSN: [id number] Arrival date & time: 01/27/20  1652     History Chief Complaint  Patient presents with  . Headache    Sheryl Curtis is a 86 y.o. female.  The history is provided by the patient, medical records and a relative. No language interpreter was used.  Headache  Sheryl Curtis is a 86 y.o. female who presents to the Emergency Department complaining of headache. Level V caveat due to confusion. She presents the emergency department accompanied by her daughter for evaluation of posterior head and neck pain. Pain started at an unknown time but at least one week ago. She does have a history of recurrent headaches per the daughter. She is unable to describe the pain but states that is worse with range of motion of the neck. She states that six days ago 911 was called and EMS presented to her home for evaluation of left upper extremity weakness and numbness. The episode lasted about 10 minutes. She states that she was told she had a mini stroke but did not go to the emergency department because she was told there would be a wait. Her left upper extremity weakness and numbness has resolved. She does complain of intermittent double vision and difficulty with seeing since her glasses were replaced three weeks ago. They reports of fevers, nausea, vomiting. She does have intermittent chest pain, abdominal pain, shortness of breath, generalized weakness and difficulty with walking. She lives at home with her daughter. Symptoms are severe, constant, worsening. No recent injuries.    Past Medical History:  Diagnosis Date  . ALLERGIC RHINITIS   . Atrial fibrillation ([place])   . CANCER, COLORECTAL   . CHEST PAIN, ATYPICAL   . CKD (chronic kidney disease)   . DYSPNEA   . Essential hypertension, benign   . FIBROMYALGIA   . G E R D   . History of stress test    Myoview 8/16:  EF 69%, anterior and apical defect c/w breast attenuation;  Low Risk   . INSOMNIA   . Morbid obesity ([place])   . Other and unspecified angina pectoris   . Sleep apnea     Patient Active Problem List   Diagnosis Date Noted  . Mild persistent asthma without complication [id number]  . Dysphagia 06/14/2019  . Neck pain, chronic 04/21/2019  . Physical deconditioning 12/09/2018  . CKD (chronic kidney disease)   . Morbid obesity ([place])   . Mild persistent asthma with (acute) exacerbation 07/16/2018  . Cough 07/16/2018  . OSA (obstructive sleep apnea) 07/30/2017  . Trochanteric bursitis, right hip 04/21/2017  . Nocturnal hypoxia 03/31/2017  . Sinus bradycardia 08/05/2016  . Hypertensive urgency 08/03/2016  . Asthma 02/19/2016  . Chronic kidney disease (CKD), stage III (moderate) 07/05/2015  . CAFL (chronic airflow limitation) ([place]) 07/05/2015  . Generalized OA 07/05/2015  . 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 H/O malignant neoplasm of colon 07/05/2015  . Elevated troponin 07/02/2015  . UTI (lower urinary tract infection) 07/01/2015  . Fibromyalgia syndrome 07/01/2015  . Hyponatremia 07/01/2015  . Urinary tract infectious disease   . Pulmonary nodules/lesions, multiple 04/11/2014  . Abdominal pain, generalized 04/07/2014  . Coronary atherosclerosis of native coronary artery 10/12/2013  . Essential hypertension, benign   . Other and unspecified angina pectoris   . Atrial fibrillation ([place]) 04/19/2010  . Allergic rhinitis 04/19/2010  . DYSPNEA 04/19/2010  . CHEST PAIN, ATYPICAL 04/19/2010  . CANCER, COLORECTAL 04/18/2010  .  Essential hypertension 04/18/2010  . G E R D 04/18/2010  . Fibromyalgia 04/18/2010  . INSOMNIA 04/18/2010    Past Surgical History:  Procedure Laterality Date  . ABDOMINAL HYSTERECTOMY    . ANKLE FRACTURE SURGERY    . BACK SURGERY    . BREAST SURGERY    . COLON SURGERY    . CORONARY STENT PLACEMENT    . PERCUTANEOUS CORONARY STENT INTERVENTION (PCI-S) N/A 05/19/2013   Procedure: PERCUTANEOUS CORONARY STENT  INTERVENTION (PCI-S);  Surgeon: Jettie Booze, MD;  Location: [facility] CATH LAB;  Service: Cardiovascular;  Laterality: N/A;     OB History   No obstetric history on file.     Family History  Problem Relation Age of Onset  . CAD Brother   . Heart attack Brother 18  . Bone cancer Sister   . Throat cancer Daughter     Social History   Tobacco Use  . Smoking status: Former Smoker    Packs/day: 1.00    Years: 12.00    Pack years: 12.00    Types: Cigarettes    Start date: 10/28/1949    Quit date: 10/28/1962    Years since quitting: 57.2  . Smokeless tobacco: Never Used  Substance Use Topics  . Alcohol use: No  . Drug use: No    Home Medications Prior to Admission medications   Medication Sig Start Date End Date Taking? Authorizing Provider  acetaminophen (TYLENOL) 650 MG CR tablet Take 650 mg by mouth every 8 (eight) hours as needed for pain.   Yes [provider]  acetaminophen-codeine (TYLENOL #3) 300-30 MG tablet Take 1 tablet by mouth 2 (two) times daily as needed for moderate pain. 01/26/20  Yes Raulkar, Clide Deutscher, MD  albuterol (PROVENTIL) (2.5 MG/3ML) 0.083% nebulizer solution USE 1 VIAL VIA NEBULIZER EVERY 6 HOURS AS NEEDED Patient taking differently: Take 2.5 mg by nebulization in the morning and at bedtime.  09/28/19  Yes Rigoberto Noel, MD  Albuterol Sulfate (PROAIR RESPICLICK) [id number] (90 Base) MCG/ACT AEPB Inhale 1-2 puffs into the lungs every 6 (six) hours as needed (for shortness of breath and/or wheezing.). Patient taking differently: Inhale 1 puff into the lungs in the morning and at bedtime.  12/09/18  Yes Rigoberto Noel, MD  ALPRAZolam Duanne Moron) 0.25 MG tablet Take 0.25 mg by mouth 3 (three) times daily as needed for anxiety.   Yes [provider]  Ascorbic Acid (VITAMIN C) 1000 MG tablet Take 1,000 mg by mouth daily.   Yes [provider]  budesonide-formoterol (SYMBICORT) 160-4.5 MCG/ACT inhaler Inhale 2 puffs into the lungs 2 (two) times  daily. 12/01/19  Yes Magdalen Spatz, NP  Cholecalciferol (VITAMIN D) 50 MCG (2000 UT) tablet Take 2,000 Units by mouth daily.   Yes [provider]  clopidogrel (PLAVIX) 75 MG tablet TAKE 1 TABLET(75 MG) BY MOUTH EVERY OTHER DAY Patient taking differently: Take 75 mg by mouth daily.  10/29/16  Yes Jettie Booze, MD  cyclobenzaprine (FLEXERIL) 5 MG tablet Take 1 tablet (5 mg total) by mouth 3 (three) times daily as needed for muscle spasms. 11/30/19  Yes Raulkar, Clide Deutscher, MD  diphenhydrAMINE (BENADRYL ALLERGY) 25 mg capsule Take 1 capsule (25 mg total) by mouth at bedtime as needed. 12/28/19  Yes Raulkar, Clide Deutscher, MD  DULoxetine (CYMBALTA) 60 MG capsule Take 60 mg by mouth daily. 09/25/17  Yes [provider]  furosemide (LASIX) 40 MG tablet Take 1 tablet (40 mg total) by mouth daily.  03/10/19  Yes Imogene Burn, PA-C  isosorbide mononitrate (IMDUR) 60 MG 24 hr tablet TAKE 1 TABLET BY MOUTH  DAILY 01/07/20  Yes Jettie Booze, MD  metaxalone (SKELAXIN) 800 MG tablet Take 1 tablet (800 mg total) by mouth 3 (three) times daily. Patient taking differently: Take 800 mg by mouth daily.  01/14/20 01/13/21 Yes Raulkar, Clide Deutscher, MD  Nebulizers (COMPRESSOR NEBULIZER) MISC 1 Device by Does not apply route once. 04/04/16  Yes Young, Tarri Fuller D, MD  nitroGLYCERIN (NITROSTAT) 0.4 MG SL tablet Place 1 tablet (0.4 mg total) under the tongue every 5 (five) minutes as needed for chest pain. 12/24/19  Yes Jettie Booze, MD  Omega-3 Fatty Acids (FISH OIL) 1200 MG CAPS Take 1,200 mg by mouth daily.   Yes [provider]  polyethylene glycol (MIRALAX / GLYCOLAX) 17 g packet Take 17 g by mouth daily as needed for mild constipation.    Yes [provider]  potassium chloride SA (KLOR-CON) 20 MEQ tablet TAKE 1 TABLET BY MOUTH  DAILY 12/27/19  Yes Jettie Booze, MD  Respiratory Therapy Supplies (FLUTTER) DEVI Use flutter device 3 times a day 07/22/18  Yes Martyn Ehrich, NP  rosuvastatin (CRESTOR) 20 MG tablet Take 1 tablet (20 mg total) by mouth daily. 12/24/19  Yes Jettie Booze, MD  valsartan-hydrochlorothiazide (DIOVAN-HCT) 160-12.5 MG tablet Take 1 tablet by mouth daily.   Yes [provider]  ipratropium (ATROVENT) 0.03 % nasal spray Place 2 sprays into both nostrils every 12 (twelve) hours. Patient not taking: Reported on 01/27/2020 10/06/18   Martyn Ehrich, NP    Allergies    Aspirin, Hydroxyquinolines, Imipramine hcl, Pamelor [nortriptyline hcl], Prednisone, Pregabalin, Procardia [nifedipine], Tramadol, Ace inhibitors, Influenza vaccines, and Plaquenil [hydroxychloroquine sulfate]  Review of Systems   Review of Systems  Neurological: Positive for headaches.  All other systems reviewed and are negative.   Physical Exam Updated Vital Signs BP (!) 174/65   Pulse 81   Temp 98.4 F (36.9 C) (Oral)   Resp 17   SpO2 95%   Physical Exam Vitals and nursing note reviewed.  Constitutional:      Appearance: She is well-developed.  HENT:     Head: Normocephalic and atraumatic.  Cardiovascular:     Rate and Rhythm: Normal rate and regular rhythm.     Heart sounds: No murmur.  Pulmonary:     Effort: Pulmonary effort is normal. No respiratory distress.     Breath sounds: Normal breath sounds.  Abdominal:     Palpations: Abdomen is soft.     Tenderness: There is no abdominal tenderness. There is no guarding or rebound.  Musculoskeletal:        General: No tenderness.     Cervical back: Neck supple.  Skin:    General: Skin is warm and dry.  Neurological:     Mental Status: She is alert.     Comments: Disoriented to time. Oriented to place. Mildly confused. Generalized weakness. No asymmetry of facial movements. Visual fields are grossly intact. EOMI  Psychiatric:        Behavior: Behavior normal.     ED Results / Procedures / Treatments   Labs (all labs ordered are listed, but only abnormal results are  displayed) Labs Reviewed  COMPREHENSIVE METABOLIC PANEL - Abnormal; Notable for the following components:      Result Value   Potassium 3.3 (*)    Glucose, Bld 111 (*)    Creatinine, Ser  1.11 (*)    GFR calc non Af Amer 45 (*)    GFR calc Af Amer 52 (*)    All other components within normal limits  URINALYSIS, ROUTINE W REFLEX MICROSCOPIC - Abnormal; Notable for the following components:   Color, Urine STRAW (*)    Specific Gravity, Urine 1.004 (*)    All other components within normal limits  I-STAT CHEM 8, ED - Abnormal; Notable for the following components:   Potassium 3.2 (*)    Creatinine, Ser 1.20 (*)    Glucose, Bld 104 (*)    All other components within normal limits  CBC WITH DIFFERENTIAL/PLATELET  I-STAT BETA HCG BLOOD, ED ([facility], [facility], [facility] ONLY)  TROPONIN I (HIGH SENSITIVITY)  TROPONIN I (HIGH SENSITIVITY)    EKG None  Radiology CT Angio Head W/Cm &/Or Wo Cm  Result Date: 01/27/2020 CLINICAL DATA:  Headaches and possible recent stroke. EXAM: CT ANGIOGRAPHY HEAD AND NECK TECHNIQUE: Multidetector CT imaging of the head and neck was performed using the standard protocol during bolus administration of intravenous contrast. Multiplanar CT image reconstructions and MIPs were obtained to evaluate the vascular anatomy. Carotid stenosis measurements (when applicable) are obtained utilizing NASCET criteria, using the distal internal carotid diameter as the denominator. CONTRAST:  44mL OMNIPAQUE IOHEXOL 350 MG/ML SOLN COMPARISON:  None. FINDINGS: CT HEAD FINDINGS Brain: There is no mass, hemorrhage or extra-axial collection. The size and configuration of the ventricles and extra-axial CSF spaces are normal. There is no acute or chronic infarction. The brain parenchyma is normal. Skull: The visualized skull base, calvarium and extracranial soft tissues are normal. Sinuses/Orbits: No fluid levels or advanced mucosal thickening of the visualized paranasal sinuses. No mastoid or middle ear  effusion. The orbits are normal. CTA NECK FINDINGS SKELETON: There is no bony spinal canal stenosis. No lytic or blastic lesion. OTHER NECK: Normal pharynx, larynx and major salivary glands. No cervical lymphadenopathy. Unremarkable thyroid gland. UPPER CHEST: No pneumothorax or pleural effusion. No nodules or masses. AORTIC ARCH: There is mild calcific atherosclerosis of the aortic arch. There is no aneurysm, dissection or hemodynamically significant stenosis of the visualized portion of the aorta. Conventional 3 vessel aortic branching pattern. The visualized proximal subclavian arteries are widely patent. RIGHT CAROTID SYSTEM: No dissection, occlusion or aneurysm. There is mixed density atherosclerosis extending into the proximal ICA, resulting in 50% stenosis. LEFT CAROTID SYSTEM: No dissection, occlusion or aneurysm. There is mixed density atherosclerosis extending into the proximal ICA, resulting in less than 50% stenosis. VERTEBRAL ARTERIES: Left dominant configuration. Both origins are clearly patent. There is no dissection, occlusion or flow-limiting stenosis to the skull base (V1-V3 segments). CTA HEAD FINDINGS POSTERIOR CIRCULATION: --Vertebral arteries: Normal V4 segments. --Posterior inferior cerebellar arteries (PICA): Patent origins from the vertebral arteries. --Anterior inferior cerebellar arteries (AICA): Patent origins from the basilar artery. --Basilar artery: Normal. --Superior cerebellar arteries: Normal. --Posterior cerebral arteries: Normal. The right PCA is predominantly supplied by the posterior communicating artery. ANTERIOR CIRCULATION: --Intracranial internal carotid arteries: Normal. --Anterior cerebral arteries (ACA): Normal. Both A1 segments are present. Patent anterior communicating artery (a-comm). --Middle cerebral arteries (MCA): Normal. VENOUS SINUSES: As permitted by contrast timing, patent. ANATOMIC VARIANTS: Fetal origin of the right posterior cerebral artery. Review of the  MIP images confirms the above findings. IMPRESSION: 1. No intracranial arterial occlusion or hemodynamically significant stenosis. 2. Bilateral carotid bifurcation atherosclerosis with approximately 50% stenosis on the right. No hemodynamically significant stenosis on the left. 3.  Aortic atherosclerosis (ICD10-I70.0). Electronically Signed  By: Ulyses Jarred M.D.   On: 01/27/2020 21:14   CT Angio Neck W and/or Wo Contrast  Result Date: 01/27/2020 CLINICAL DATA:  Headaches and possible recent stroke. EXAM: CT ANGIOGRAPHY HEAD AND NECK TECHNIQUE: Multidetector CT imaging of the head and neck was performed using the standard protocol during bolus administration of intravenous contrast. Multiplanar CT image reconstructions and MIPs were obtained to evaluate the vascular anatomy. Carotid stenosis measurements (when applicable) are obtained utilizing NASCET criteria, using the distal internal carotid diameter as the denominator. CONTRAST:  53mL OMNIPAQUE IOHEXOL 350 MG/ML SOLN COMPARISON:  None. FINDINGS: CT HEAD FINDINGS Brain: There is no mass, hemorrhage or extra-axial collection. The size and configuration of the ventricles and extra-axial CSF spaces are normal. There is no acute or chronic infarction. The brain parenchyma is normal. Skull: The visualized skull base, calvarium and extracranial soft tissues are normal. Sinuses/Orbits: No fluid levels or advanced mucosal thickening of the visualized paranasal sinuses. No mastoid or middle ear effusion. The orbits are normal. CTA NECK FINDINGS SKELETON: There is no bony spinal canal stenosis. No lytic or blastic lesion. OTHER NECK: Normal pharynx, larynx and major salivary glands. No cervical lymphadenopathy. Unremarkable thyroid gland. UPPER CHEST: No pneumothorax or pleural effusion. No nodules or masses. AORTIC ARCH: There is mild calcific atherosclerosis of the aortic arch. There is no aneurysm, dissection or hemodynamically significant stenosis of the  visualized portion of the aorta. Conventional 3 vessel aortic branching pattern. The visualized proximal subclavian arteries are widely patent. RIGHT CAROTID SYSTEM: No dissection, occlusion or aneurysm. There is mixed density atherosclerosis extending into the proximal ICA, resulting in 50% stenosis. LEFT CAROTID SYSTEM: No dissection, occlusion or aneurysm. There is mixed density atherosclerosis extending into the proximal ICA, resulting in less than 50% stenosis. VERTEBRAL ARTERIES: Left dominant configuration. Both origins are clearly patent. There is no dissection, occlusion or flow-limiting stenosis to the skull base (V1-V3 segments). CTA HEAD FINDINGS POSTERIOR CIRCULATION: --Vertebral arteries: Normal V4 segments. --Posterior inferior cerebellar arteries (PICA): Patent origins from the vertebral arteries. --Anterior inferior cerebellar arteries (AICA): Patent origins from the basilar artery. --Basilar artery: Normal. --Superior cerebellar arteries: Normal. --Posterior cerebral arteries: Normal. The right PCA is predominantly supplied by the posterior communicating artery. ANTERIOR CIRCULATION: --Intracranial internal carotid arteries: Normal. --Anterior cerebral arteries (ACA): Normal. Both A1 segments are present. Patent anterior communicating artery (a-comm). --Middle cerebral arteries (MCA): Normal. VENOUS SINUSES: As permitted by contrast timing, patent. ANATOMIC VARIANTS: Fetal origin of the right posterior cerebral artery. Review of the MIP images confirms the above findings. IMPRESSION: 1. No intracranial arterial occlusion or hemodynamically significant stenosis. 2. Bilateral carotid bifurcation atherosclerosis with approximately 50% stenosis on the right. No hemodynamically significant stenosis on the left. 3.  Aortic atherosclerosis (ICD10-I70.0). Electronically Signed   By: Ulyses Jarred M.D.   On: 01/27/2020 21:14   MR BRAIN WO CONTRAST  Result Date: 01/27/2020 CLINICAL DATA:  Headaches and  left-sided weakness EXAM: MRI HEAD WITHOUT CONTRAST TECHNIQUE: Multiplanar, multiecho pulse sequences of the brain and surrounding structures were obtained without intravenous contrast. COMPARISON:  None. FINDINGS: BRAIN: No acute infarct, acute hemorrhage or extra-axial collection. Multifocal white matter hyperintensity, most commonly due to chronic ischemic microangiopathy. There is generalized atrophy without lobar predilection. Midline structures are normal. VASCULAR: Major flow voids are preserved. Susceptibility-sensitive sequences show no chronic microhemorrhage or superficial siderosis. SKULL AND UPPER CERVICAL SPINE: Normal calvarium and skull base. Visualized upper cervical spine and soft tissues are normal. SINUSES/ORBITS: No paranasal sinus fluid levels or advanced  mucosal thickening. No mastoid or middle ear effusion. Normal orbits. IMPRESSION: Generalized atrophy and findings of chronic small vessel disease without acute intracranial abnormality. Electronically Signed   By: Ulyses Jarred M.D.   On: 01/27/2020 22:36    Procedures Procedures (including critical care time)  Medications Ordered in ED Medications  sodium chloride (PF) 0.9 % injection (has no administration in time range)  iohexol (OMNIPAQUE) 350 MG/ML injection 75 mL (75 mLs Intravenous Contrast Given 01/27/20 2029)  acetaminophen (TYLENOL) tablet 650 mg (650 mg Oral Given 01/27/20 2116)  metoCLOPramide (REGLAN) injection 5 mg (5 mg Intravenous Given 01/27/20 2117)  LORazepam (ATIVAN) injection 0.5 mg (0.5 mg Intravenous Given 01/27/20 2137)  labetalol (NORMODYNE) injection 5 mg (5 mg Intravenous Given 01/27/20 2231)    ED Course  I have reviewed the triage vital signs and the nursing notes.  Pertinent labs & imaging results that were available during my care of the patient were reviewed by me and considered in my medical decision making (see chart for details).    MDM Rules/Calculators/A&P                     Patient here for  evaluation of headache and neck pain, has history of same but the symptoms are worsened. She also had an episode one week ago of transient weakness to the left upper extremity for which she did not seek evaluation. On examination she is confused but non-toxic appearing with no focal neurologic deficits. CTA was obtained, which was negative for dissection, thrombus, aneurysm. Given patient's recent transient weakness an MRI was obtained as well, which was negative for CVA. On repeat evaluation following medications her headache is completely resolved and she is requesting discharge. Discussed with patient and daughter importance of close PCP follow-up for further evaluation for possible TIA that occurred one week ago. Discussed close return precautions.  Final Clinical Impression(s) / ED Diagnoses Final diagnoses:  Bad headache  Essential hypertension    Rx / DC Orders ED Discharge Orders    None       Quintella Reichert, MD 01/27/20 2354

## 2020-01-27 NOTE — ED Notes (Signed)
Lab called regarding blood work not in process, Labs sent at [facility]

## 2020-01-27 NOTE — ED Notes (Signed)
Dr. Rees at bedside  

## 2020-01-27 NOTE — ED Notes (Signed)
Pt transported to CT Scan

## 2020-01-27 NOTE — ED Triage Notes (Signed)
Per pt, states she had a "light" stroke last Friday-states she has been having headaches for a week-states pain from top of head down to back of neck

## 2020-01-27 NOTE — ED Notes (Signed)
Assisted pt with using the [facility]

## 2020-01-27 NOTE — ED Notes (Signed)
Mini-lab ran I-Stat Hcg instead of Chem-8

## 2020-01-28 LAB — TROPONIN I (HIGH SENSITIVITY): Troponin I (High Sensitivity): 10 ng/L (ref <18)

## 2020-01-31 ENCOUNTER — Ambulatory Visit: Payer: Medicare Other | Admitting: Physical Medicine and Rehabilitation

## 2020-02-01 ENCOUNTER — Encounter
Payer: Medicare Other | Attending: Physical Medicine and Rehabilitation | Admitting: Physical Medicine and Rehabilitation

## 2020-02-01 ENCOUNTER — Other Ambulatory Visit: Payer: Self-pay

## 2020-02-01 VITALS — BP 163/77 | HR 87 | Temp 97.5°F | Ht 64.0 in | Wt 205.2 lb

## 2020-02-01 DIAGNOSIS — Z79891 Long term (current) use of opiate analgesic: Secondary | ICD-10-CM | POA: Diagnosis present

## 2020-02-01 DIAGNOSIS — Z5181 Encounter for therapeutic drug level monitoring: Secondary | ICD-10-CM | POA: Diagnosis present

## 2020-02-01 DIAGNOSIS — G459 Transient cerebral ischemic attack, unspecified: Secondary | ICD-10-CM | POA: Diagnosis not present

## 2020-02-01 DIAGNOSIS — M25512 Pain in left shoulder: Secondary | ICD-10-CM | POA: Diagnosis present

## 2020-02-01 DIAGNOSIS — M47812 Spondylosis without myelopathy or radiculopathy, cervical region: Secondary | ICD-10-CM | POA: Diagnosis not present

## 2020-02-01 DIAGNOSIS — G894 Chronic pain syndrome: Secondary | ICD-10-CM | POA: Insufficient documentation

## 2020-02-01 DIAGNOSIS — M797 Fibromyalgia: Secondary | ICD-10-CM | POA: Insufficient documentation

## 2020-02-01 DIAGNOSIS — G8929 Other chronic pain: Secondary | ICD-10-CM | POA: Insufficient documentation

## 2020-02-01 MED ORDER — OXYCODONE-ACETAMINOPHEN 7.5-325 MG PO TABS: 1.0000 | ORAL_TABLET | ORAL | 0 refills | Status: DC | PRN

## 2020-02-01 NOTE — Progress Notes (Signed)
Subjective:    Patient ID: Sheryl Curtis, female    DOB: 08/31/1932, 86 y.o.   MRN: [id number]  HPI  11/02/19: [name] presentsfor phone follow-up of her fibromyalgia, chronic left shoulder pain, cervical spondylosis,She has pains in her bilateral shoulders, neck, bilateral hips, knees, ankles, and low back. She has been diagnosed with fibromyalgia, osteoarthritis, and rheumatoid arthritis. Her quality of life is greatly diminished and she mostly watches TV and uses her ipad. Once in a while she ambulates around her home with assistive device or ambulates outside, but she finds it too cold now and she is in too much pain. She finds the Tramadol does not help her. She believes she was on 50mg  ofLyrica in the past without benefit.Last visit I prescribed 50mg  BID but she did not find relief with this. She did not experience side effects. She asks whether the Lyrica will also help with her OA and RA and notes that her shoulder pain has been worsening.  2/2: [name] recently was diagnosed with COVIDand has been quarantined at home. She does not feel SOB but does feel very weak. Her daughter got COVID first, requiring admission to the hospital. She was recently discharged and returned home to help care for her mother. She also provides history during this phone call. Her pain has continued to be severe, although she does find relief from the Lyrica. It does not make her too sleepy. She would also like to try a muscle relaxer for her muscle spasms. Her pains continue to be diffuse and worst in her neck, shoulders, and lower back.She is able to ambulate around her home but has been unable to be more mobile due to her COVID-related weakness.  3/2: Dose escalation of Lyrica may have been too quick for her and she developed tremor. PCP thought may be secondary to Lyrica and she was sent to ED. Has recovered well and is no longer taking lyrica. Continues to have severe pain that is affecting  her quality of life. Daughter accompanies her again today.   She still takes the Flexiril as needed for muscle spasm but it can make her sleepy during the day. She has never tried Air traffic controller and would like to try.  She has diffuse itching and no rash. Has been taking Hydroxyzine without relief. Also sleeping poorly at night. Has not started any new medications recently or tried any new detergents.   4/6: [name] has not been able to tolerate the Skelaxin, Tramadol, or Tylenol with codeine, all of which made her nauseous or sedated. She continues to have severe diffuse pains. At this time she and her daughter prefer to accept the risks of stronger narcotic medications if they will provide pain relief. She notes one of her greatest sources of pain today is left sided neck pain associated with limited left sided neck rotation. She would be interested in trying spinal injection. She recently had CT of her neck performed as she had a TIA with symptoms of slurring of speech. Currently has no residual deficits. She takes no BP medications. She was started on something in the past but it was removed as increased her afib, daughter states. They are not sure of the name of the medication. She does follow with a PCP and has a BP cuff at home though does not use it.   Pain Inventory Average Pain 7 Pain Right Now 4 My pain is constant, sharp, burning, dull, stabbing, tingling and aching  In the  last 24 hours, has pain interfered with the following? General activity 5 Relation with others 6 Enjoyment of life 5 What TIME of day is your pain at its worst? daytime Sleep (in general) Good  Pain is worse with: walking, standing and some activites Pain improves with: rest and medication Relief from Meds: 1  Mobility walk with assistance use a cane how many minutes can you walk? 10 ability to climb steps?  no do you drive?  no use a wheelchair transfers  alone  Function retired  Neuro/Psych weakness trouble walking  Prior Studies Any changes since last visit?  no  Physicians involved in your care Any changes since last visit?  no   Family History  Problem Relation Age of Onset  . CAD Brother   . Heart attack Brother 51  . Bone cancer Sister   . Throat cancer Daughter    Social History   Socioeconomic History  . Marital status: Widowed    Spouse name: Not on file  . Number of children: 4  . Years of education: Not on file  . Highest education level: Not on file  Occupational History  . Not on file  Tobacco Use  . Smoking status: Former Smoker    Packs/day: 1.00    Years: 12.00    Pack years: 12.00    Types: Cigarettes    Start date: 10/28/1949    Quit date: 10/28/1962    Years since quitting: 57.3  . Smokeless tobacco: Never Used  Substance and Sexual Activity  . Alcohol use: No  . Drug use: No  . Sexual activity: Not on file  Other Topics Concern  . Not on file  Social History Narrative  . Not on file   Social Determinants of Health   Financial Resource Strain:   . Difficulty of Paying Living Expenses:   Food Insecurity: No Food Insecurity  . Worried About Charity fundraiser in the Last Year: Never true  . Ran Out of Food in the Last Year: Never true  Transportation Needs: No Transportation Needs  . Lack of Transportation (Medical): No  . Lack of Transportation (Non-Medical): No  Physical Activity:   . Days of Exercise per Week:   . Minutes of Exercise per Session:   Stress:   . Feeling of Stress :   Social Connections:   . Frequency of Communication with Friends and Family:   . Frequency of Social Gatherings with Friends and Family:   . Attends Religious Services:   . Active Member of Clubs or Organizations:   . Attends Archivist Meetings:   Marland Kitchen Marital Status:    Past Surgical History:  Procedure Laterality Date  . ABDOMINAL HYSTERECTOMY    . ANKLE FRACTURE SURGERY    . BACK  SURGERY    . BREAST SURGERY    . COLON SURGERY    . CORONARY STENT PLACEMENT    . PERCUTANEOUS CORONARY STENT INTERVENTION (PCI-S) N/A 05/19/2013   Procedure: PERCUTANEOUS CORONARY STENT INTERVENTION (PCI-S);  Surgeon: Jettie Booze, MD;  Location: [facility] CATH LAB;  Service: Cardiovascular;  Laterality: N/A;   Past Medical History:  Diagnosis Date  . ALLERGIC RHINITIS   . Atrial fibrillation ([place])   . CANCER, COLORECTAL   . CHEST PAIN, ATYPICAL   . CKD (chronic kidney disease)   . DYSPNEA   . Essential hypertension, benign   . FIBROMYALGIA   . G E R D   . History of stress test  Myoview 8/16:  EF 69%, anterior and apical defect c/w breast attenuation; Low Risk   . INSOMNIA   . Morbid obesity ([place])   . Other and unspecified angina pectoris   . Sleep apnea    BP (!) 163/77   Pulse 87   Temp (!) 97.5 F (36.4 C)   Ht 5\' 4"  (1.626 m)   Wt 205 lb 3.2 oz (93.1 kg)   SpO2 94%   BMI 35.22 kg/m   Opioid Risk Score:   Fall Risk Score:  `1  Depression screen PHQ 2/9  Depression screen [facility] 2/9 01/21/2020 10/25/2019 07/22/2019 04/21/2019 01/13/2019 01/04/2019 10/29/2018  Decreased Interest - 0 0 0 1 0 0  Down, Depressed, Hopeless 2 0 0 0 1 0 0  PHQ - 2 Score 2 0 0 0 2 0 0  Altered sleeping 1 - - - 1 - -  Tired, decreased energy 0 - - - 1 - -  Change in appetite 0 - - - 0 - -  Feeling bad or failure about yourself  1 - - - 1 - -  Trouble concentrating 1 - - - 1 - -  Moving slowly or fidgety/restless 0 - - - 0 - -  Suicidal thoughts 0 - - - 0 - -  PHQ-9 Score 5 - - - 6 - -  Difficult doing work/chores Somewhat difficult - - - Somewhat difficult - -  Some recent data might be hidden    Review of Systems  Musculoskeletal: Positive for gait problem.  Neurological: Positive for weakness.  All other systems reviewed and are negative.      Objective:   Physical Exam Gen: no distress, normal appearing, dressed nicely and in good spirits.  HEENT: oral mucosa pink and moist,  NCAT Cardio: Reg rate Chest: normal effort, normal rate of breathing Abd: soft, non-distended Ext: no edema Skin: intact Neuro:AOx3 Musculoskeletal: Diffusely weak without focal deficits. FROM of lumbar spine in flexion and extension with pain on extension in lower lumbar spine. Tender points along upper back and neck muscles. Lateral rotation limited to 25 degrees bilaterally with pain at end range. Protracted posture. Cervical range of motion limited to 10 degrees on left sided lateral rotation with tenderness over left sided lateral masses, pain exacerbated by oblique extension.  For the first time, ambulated to office herself with cane! Psych:Much more positive mood!    Assessment & Plan:  Mrs. Kebra Halker presents with diffuse pains. She has pains in her bilateral shoulders, neck, bilateral hips, knees, ankles, and low back.  1) Fibromyalgia: --previously discontinued Lyrica given side effects of tremor.  --Had negative side effects in response to Skelaxin, Tramadol, and Tylenol with Codeine since last visit. --Prescribed Percocet 7.5mg , patient will break pill in half. Advised regarding risks and benefits of this medication.  --Discussed that out goal will be to provide pain relief so we can increase her mobility, as exercise is a crucial component of fibromyalgia treatment.Currently her mobility is very limited secondary to her COVID-related weakness.  2) Chronic left shoulder pain:I have personally reviewed XR; within normal limits.  3) Left sided facet arthrosis: She is interested in medial branch block. She will discuss with Dr. Ernestina Patches.   4) Recent TIA: reviewed medications. She is on statin and Plavix. Advised follow-up with her PCP for better BP control and advised that this is the greatest non-modifiable risk factor for stroke and she is at risk for future strokes given her recent TIA. Advised that she  record BP daily at home and bring to her PCP at next available  appointment.   All questions were encouraged and answered. Follow up with me in4weeks.

## 2020-02-02 ENCOUNTER — Telehealth: Payer: Self-pay | Admitting: Physical Medicine and Rehabilitation

## 2020-02-02 ENCOUNTER — Encounter: Payer: Self-pay | Admitting: Physical Medicine and Rehabilitation

## 2020-02-02 NOTE — Telephone Encounter (Signed)
Patient saw a physician yesterday who asked er to call us to see if we can do cervical medial branch blocks for her. Please advise.

## 2020-02-03 ENCOUNTER — Other Ambulatory Visit: Payer: Self-pay | Admitting: Physical Medicine and Rehabilitation

## 2020-02-03 DIAGNOSIS — M47812 Spondylosis without myelopathy or radiculopathy, cervical region: Secondary | ICD-10-CM

## 2020-02-03 MED ORDER — OXYCODONE-ACETAMINOPHEN 7.5-325 MG PO TABS: 1.0000 | ORAL_TABLET | ORAL | 0 refills | Status: DC | PRN

## 2020-02-07 NOTE — Telephone Encounter (Signed)
Looks like they referred her to [facility], might be better for her there?

## 2020-02-09 ENCOUNTER — Telehealth: Payer: Self-pay

## 2020-02-09 NOTE — Telephone Encounter (Signed)
Called and spoke to patient. She is not having any cardiac Sx. She was seen in February 2021 and we planned f/u in 1 year. Appointment cancelled for tomorrow. She will let us know if she develops any Sx.

## 2020-02-10 ENCOUNTER — Ambulatory Visit: Payer: Medicare Other | Admitting: Interventional Cardiology

## 2020-02-10 ENCOUNTER — Other Ambulatory Visit: Payer: Self-pay | Admitting: Physical Medicine and Rehabilitation

## 2020-02-10 MED ORDER — OXYCODONE-ACETAMINOPHEN 7.5-325 MG PO TABS: 1.0000 | ORAL_TABLET | ORAL | 0 refills | Status: DC | PRN

## 2020-02-11 ENCOUNTER — Telehealth: Payer: Self-pay | Admitting: Physical Medicine and Rehabilitation

## 2020-02-11 NOTE — Telephone Encounter (Signed)
Hi Dr. Irish Lack,  Ms. Sheryl Curtis is scheduled for an interlaminar epidural steroid injection with Dr. Ernestina Patches on 03/02/2020 and they are asking to hold Plavix. She has a history of CAD s/p stenting ot LAD in 2014. Myoview in 2016 was low risk with no evidence of ischemia. You recently saw her on 12/24/2019 and she was noted occasional twinges her chest unchanged from normal but no exertional symptoms and was otherwise doing well. Can she hold Plavix for 7 days prior to injection?  Please route response back to P CV DIV PREOP.   Thank you!

## 2020-02-11 NOTE — Telephone Encounter (Signed)
Hi Courtney,   I have asked Dr. Irish Lack to give his recommendations on holding Plavix. However, do you mind faxing a clearance form to our office. Fax Number: [phone].   Thank you! Ndea Kilroy

## 2020-02-11 NOTE — Telephone Encounter (Signed)
Patient is scheduled for an interlaminar epidural steroid injection with Dr. Ernestina Patches on 03/02/2020. She will need to hold her Plavix for 7 days prior to the injection. Will this be ok?

## 2020-02-12 NOTE — Telephone Encounter (Signed)
OK to hold Plavix 7 days prior to injection.  JV

## 2020-02-14 ENCOUNTER — Telehealth: Payer: Self-pay | Admitting: Interventional Cardiology

## 2020-02-14 NOTE — Telephone Encounter (Signed)
Made daughter aware that the patient should be taking potassium 20 mEq QD and rosuvastatin 20 mg QD. Daughter verbalized understanding and thanked me for the call.

## 2020-02-14 NOTE — Telephone Encounter (Signed)
Called pt and lvm #1 

## 2020-02-14 NOTE — Telephone Encounter (Signed)
New Message:   [name] wants to know what Cholesterol medicine and Potassium medicine is pt supposed to be taking?

## 2020-02-18 ENCOUNTER — Telehealth: Payer: Self-pay | Admitting: *Deleted

## 2020-02-18 ENCOUNTER — Telehealth: Payer: Self-pay | Admitting: Pulmonary Disease

## 2020-02-18 NOTE — Telephone Encounter (Signed)
Left message for patient to call back  

## 2020-02-18 NOTE — Telephone Encounter (Signed)
Schedule virtual visit or pt can contact pcp/ urgent care.   Wyn Quaker FNP

## 2020-02-18 NOTE — Telephone Encounter (Signed)
   Primary Cardiologist: Larae Grooms, MD  Chart reviewed as part of pre-operative protocol coverage. Given past medical history and time since last visit, based on ACC/AHA guidelines, Kaylor AHMARIE UN would be at acceptable risk for the planned procedure without further cardiovascular testing.   She may hold her Plavix for 7 days prior to the injection.  Please restart Plavix as soon as hemostasis is achieved.  I will route this recommendation to the requesting party via Epic fax function and remove from pre-op pool.  Please call with questions.  Jossie Ng. Emileo Semel NP-C    02/18/2020, 3:46 PM [place] [place] Office [phone] Fax [phone]

## 2020-02-18 NOTE — Telephone Encounter (Signed)
Called and spoke with pt who states she has been coughing at night which she states has been going on for awhile now. Pt states she has tried delsym cough syrup which is not helping with her cough. Pt stated she is not getting much sleep due to the cough. Pt states she will occ cough up white to yellow phlegm. Pt denies any fever as temp was 97.6  Pt states she did have to use her rescue inhaler about 4 nights ago due to SOB. Pt states she has also been doing at least two neb tx daily. Pt also stated that she is using her symbicort inhaler as directed.  Pt wants to have something called in to help with her symptoms.  Since pt recently saw Aaron Edelman, routing this to him. Aaron Edelman, please advise.

## 2020-02-18 NOTE — Telephone Encounter (Signed)
   [facility] Pre-operative Risk Assessment    Request for surgical clearance:   1. What type of surgery is being performed? INTERLAMNIAR EPIDURAL STEROID INJECTION   2. When is this surgery scheduled? 03/02/20   3. What type of clearance is required (medical clearance vs. Pharmacy clearance to hold med vs. Both)? MEDICAL  4. Are there any medications that need to be held prior to surgery and how long? PLAVIX x 7 DAYS PRIOR TO INJECTION  5. Practice name and name of physician performing surgery? [facility]; DR. Ernestina Patches   6. What is your office phone number [phone]    7.   What is your office fax number [phone]  8.   Anesthesia type (None, local, MAC, general) ? NONE LISTED   Julaine Hua 02/18/2020, 3:32 PM  _________________________________________________________________   (provider comments below)

## 2020-02-21 NOTE — Telephone Encounter (Signed)
LMTCB x2 for pt 

## 2020-02-22 NOTE — Telephone Encounter (Signed)
ATC patient LMTCB this is the 3rd attempt will close per protocol.

## 2020-03-02 ENCOUNTER — Encounter: Payer: Self-pay | Admitting: Physical Medicine and Rehabilitation

## 2020-03-02 ENCOUNTER — Ambulatory Visit: Payer: Medicare Other | Admitting: Physical Medicine and Rehabilitation

## 2020-03-02 ENCOUNTER — Other Ambulatory Visit: Payer: Self-pay

## 2020-03-02 VITALS — BP 137/69 | HR 78

## 2020-03-02 DIAGNOSIS — M797 Fibromyalgia: Secondary | ICD-10-CM | POA: Diagnosis not present

## 2020-03-02 DIAGNOSIS — M545 Low back pain, unspecified: Secondary | ICD-10-CM

## 2020-03-02 DIAGNOSIS — M47816 Spondylosis without myelopathy or radiculopathy, lumbar region: Secondary | ICD-10-CM

## 2020-03-02 DIAGNOSIS — G894 Chronic pain syndrome: Secondary | ICD-10-CM

## 2020-03-02 DIAGNOSIS — G8929 Other chronic pain: Secondary | ICD-10-CM

## 2020-03-02 DIAGNOSIS — F1190 Opioid use, unspecified, uncomplicated: Secondary | ICD-10-CM

## 2020-03-02 NOTE — Progress Notes (Signed)
 .  Numeric Pain Rating Scale and Functional Assessment Average Pain 9   In the last MONTH (on 0-10 scale) has pain interfered with the following?  1. General activity like being  able to carry out your everyday physical activities such as walking, climbing stairs, carrying groceries, or moving a chair?  Rating(9)   +Driver, +BT(plavix, did not hold), -Dye Allergies.

## 2020-03-06 ENCOUNTER — Encounter: Payer: Self-pay | Admitting: Physical Medicine and Rehabilitation

## 2020-03-06 NOTE — Progress Notes (Signed)
CHAUNA LASHLEY - 86 y.o. female MRN [id number]  Date of birth: Sep 15, 1932  Office Visit Note: Visit Date: 03/02/2020 PCP: Alroy Dust, L.Marlou Sa, MD Referred by: Alroy Dust, L.Marlou Sa, MD  Subjective: Chief Complaint  Patient presents with  . Lower Back - Pain   HPI: Sheryl Curtis is a 86 y.o. female who comes in today For evaluation management of chronic long-term recalcitrant now worsening axial low back pain without any referral into the legs.  I have seen the patient off and on over the last couple of years for this chronic pain issue of the lower back and of also seen at least on one occasion for her neck.  She is followed by Dr. Jean Rosenthal from an orthopedic standpoint.  Her case is very complicated with a history of fibromyalgia which is significant.  She is intolerant to most pain medications that are typically used for nerve pain and other adjunct of treatment but does not seem to be intolerant of oxycodone.  She is currently treated in pain management by Leeroy Cha, MD at the [facility] for pain rehabilitation.  Most recently she had cervical facet joint blocks by Dr. Asa Lente at the [facility] which were not effective at all.  In the past for her lumbar spine the patient has had some relief temporarily with a combination of epidural injection and facet joint or medial branch blocks.  All of our notes for those can be reviewed.  The last couple of injections at L4-5 which were medial branch blocks did give her temporarily diagnostic benefit of her back pain.  She describes pain across the lower back worse with standing and walking better at rest.  She rates this as a 9 out of 10.  Some referral in the upper thighs but nothing past the knee.  No paresthesias.  Again pain is flavored with underlying fibromyalgia.  She continues to take oxycodone with mild relief her pain is again 9 out of 10 despite the use of that medication.  She has had no focal weakness no trauma etc.  She  has not had radiofrequency ablation of the lumbar spine.  MRI reviewed today and reviewed below.  Review of Systems  Musculoskeletal: Positive for back pain.  All other systems reviewed and are negative.  Otherwise per HPI.  Assessment & Plan: Visit Diagnoses:  1. Spondylosis without myelopathy or radiculopathy, lumbar region   2. Chronic bilateral low back pain without sciatica   3. Chronic pain syndrome   4. Fibromyalgia   5. Opioid use, unspecified, uncomplicated     Plan: Findings:  Chronic recalcitrant now worsening severe low back pain 9 out of 10 pain despite oxycodone 7.5 mg.  Intolerance of multiple medications with history of fibromyalgia.  Pain consistent with facet mediated pain with extension and standing and facet loading.  She has had double diagnostic medial branch blocks with temporary relief over 60%.  At this point we will try to get preauthorization for radiofrequency ablation of the L4-5 facet joints.  Otherwise she will continue with her current pain management plan.  Unfortunately I do think the fibromyalgia is a significant contributor to her back pain.  MRI is consistent with multilevel degenerative disc height loss and facet arthropathy with some scoliosis but no high-grade stenosis or nerve compression.  There is foraminal narrowing at L5-S1.  She may also continue to want to receive evaluation and management at the [facility] as they have more avenues for other interventions that we  do here.    Meds & Orders: No orders of the defined types were placed in this encounter.  No orders of the defined types were placed in this encounter.   Follow-up: Return for Obtain preauthorization for radiofrequency ablation of the lower lumbar facet joints..   Procedures: No procedures performed  No notes on file   Clinical History: MRI LUMBAR SPINE WITHOUT CONTRAST  TECHNIQUE: Multiplanar, multisequence MR imaging of the lumbar spine was performed. No  intravenous contrast was administered.  COMPARISON:  MRI lumbar spine July 01, 2015  FINDINGS: SEGMENTATION: For the purposes of this report, the last well-formed intervertebral disc will be described as L5-S1.  ALIGNMENT: Maintenance of the lumbar lordosis. Minimal grade 1 L5-S1 retrolisthesis without spondylolysis.  VERTEBRAE:Vertebral bodies are intact. Similar severe L3-4, L4-5 and L5-S1 disc height loss, associated with levoscoliosis. Severe T11-12 and T12-L1 disc height loss associated with dextroscoliosis. Decreased T2 signal within all disc compatible with desiccation with multilevel vacuum disc. Moderate lower lumbar subacute on chronic discogenic endplate changes. No bone marrow signal abnormality to suggest acute osseous process.  CONUS MEDULLARIS: Conus medullaris terminates at T12-L1 and demonstrates normal morphology and signal characteristics. Similar 4 mm low signal nodule RIGHT thecal sac at L4 best seen on sagittal T2 10/17. Cauda equina is normal.  PARASPINAL AND SOFT TISSUES: Included prevertebral and paraspinal soft tissues are nonacute. Moderate to severe symmetric paraspinal muscle atrophy. Stable renal cysts measuring up to 15 mm on the RIGHT.  DISC LEVELS:  L1-2: Stable small broad-based disc bulge asymmetric to LEFT. Mild facet arthropathy and ligamentum flavum redundancy without canal stenosis. Minimal LEFT neural foraminal narrowing.  L2-3: Stable small broad-based disc bulge. Mild facet arthropathy and ligamentum flavum redundancy without canal stenosis. Mild to moderate LEFT neural foraminal narrowing.  L3-4: Small broad-based disc bulge, moderate RIGHT subarticular disc protrusion. Mild facet arthropathy and ligamentum flavum redundancy. No canal stenosis though partial chronic effacement RIGHT lateral recess could affect the traversing RIGHT L4 nerve. Moderate to severe RIGHT, mild LEFT neural foraminal narrowing.  L4-5:  Stable moderate broad-based disc bulge asymmetric to the RIGHT. Moderate RIGHT and mild LEFT facet arthropathy with ligamentum flavum redundancy. No canal stenosis. Severe RIGHT, moderate LEFT neural foraminal narrowing.  L5-S1: Moderate broad-based disc bulge. Mild to moderate facet arthropathy and ligamentum flavum redundancy without canal stenosis. Moderate to severe RIGHT, severe LEFT neural foraminal narrowing.  IMPRESSION: Stable degenerative change of the lumbar spine without fracture or malalignment.  No canal stenosis. Neural foraminal narrowing at all lumbar levels: Severe on the RIGHT at L4-5 and severe on the LEFT at L5-S1.  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   Electronically Signed   By: Elon Alas M.D.   On: 08/22/2016 03:14   She reports that she quit smoking about 57 years ago. Her smoking use included cigarettes. She started smoking about 70 years ago. She has a 12.00 pack-year smoking history. She has never used smokeless tobacco. No results for input(s): HGBA1C, LABURIC in the last 8760 hours.  Objective:  VS:  HT:    WT:   BMI:     BP:137/69  HR:78bpm  TEMP: ( )  RESP:  Physical Exam Vitals and nursing note reviewed.  Constitutional:      General: She is not in acute distress.    Appearance: Normal appearance. She is well-developed. She is obese. She is not ill-appearing.  HENT:     Head: Normocephalic and atraumatic.  Eyes:  Conjunctiva/sclera: Conjunctivae normal.     Pupils: Pupils are equal, round, and reactive to light.  Cardiovascular:     Rate and Rhythm: Normal rate.     Pulses: Normal pulses.  Pulmonary:     Effort: Pulmonary effort is normal.  Abdominal:     Palpations: Abdomen is soft.  Musculoskeletal:     Right lower leg: No edema.     Left lower leg: No edema.     Comments: Patient is difficult to go from sit to stand and she does have concordant pain with extension and facet loading.   She does have multiple tender points across the lower back PSIS and greater trochanters.  No pain with hip rotation has good distal strength.  Skin:    General: Skin is warm and dry.     Findings: No erythema or rash.  Neurological:     General: No focal deficit present.     Mental Status: She is alert and oriented to person, place, and time.     Sensory: No sensory deficit.     Motor: No abnormal muscle tone.     Coordination: Coordination normal.     Gait: Gait normal.  Psychiatric:        Mood and Affect: Mood normal.        Behavior: Behavior normal.     Ortho Exam  Imaging: No results found.  Past Medical/Family/Surgical/Social History: Medications & Allergies reviewed per EMR, new medications updated. Patient Active Problem List   Diagnosis Date Noted  . Mild persistent asthma without complication [id number]  . Dysphagia 06/14/2019  . Neck pain, chronic 04/21/2019  . Physical deconditioning 12/09/2018  . CKD (chronic kidney disease)   . Morbid obesity ([place])   . Mild persistent asthma with (acute) exacerbation 07/16/2018  . Cough 07/16/2018  . OSA (obstructive sleep apnea) 07/30/2017  . Trochanteric bursitis, right hip 04/21/2017  . Nocturnal hypoxia 03/31/2017  . Sinus bradycardia 08/05/2016  . Hypertensive urgency 08/03/2016  . Asthma 02/19/2016  . Chronic kidney disease (CKD), stage III (moderate) 07/05/2015  . CAFL (chronic airflow limitation) ([place]) 07/05/2015  . Generalized OA 07/05/2015  . 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 H/O malignant neoplasm of colon 07/05/2015  . Elevated troponin 07/02/2015  . UTI (lower urinary tract infection) 07/01/2015  . Fibromyalgia syndrome 07/01/2015  . Hyponatremia 07/01/2015  . Urinary tract infectious disease   . Pulmonary nodules/lesions, multiple 04/11/2014  . Abdominal pain, generalized 04/07/2014  . Coronary atherosclerosis of native coronary artery 10/12/2013  . Essential hypertension, benign   . Other and  unspecified angina pectoris   . Atrial fibrillation ([place]) 04/19/2010  . Allergic rhinitis 04/19/2010  . DYSPNEA 04/19/2010  . CHEST PAIN, ATYPICAL 04/19/2010  . CANCER, COLORECTAL 04/18/2010  . Essential hypertension 04/18/2010  . G E R D 04/18/2010  . Fibromyalgia 04/18/2010  . INSOMNIA 04/18/2010   Past Medical History:  Diagnosis Date  . ALLERGIC RHINITIS   . Atrial fibrillation ([place])   . CANCER, COLORECTAL   . CHEST PAIN, ATYPICAL   . CKD (chronic kidney disease)   . DYSPNEA   . Essential hypertension, benign   . FIBROMYALGIA   . G E R D   . History of stress test    Myoview 8/16:  EF 69%, anterior and apical defect c/w breast attenuation; Low Risk   . INSOMNIA   . Morbid obesity ([place])   . Other and unspecified angina pectoris   . Sleep apnea    Family History  Problem Relation Age of Onset  . CAD Brother   . Heart attack Brother 78  . Bone cancer Sister   . Throat cancer Daughter    Past Surgical History:  Procedure Laterality Date  . ABDOMINAL HYSTERECTOMY    . ANKLE FRACTURE SURGERY    . BACK SURGERY    . BREAST SURGERY    . COLON SURGERY    . CORONARY STENT PLACEMENT    . PERCUTANEOUS CORONARY STENT INTERVENTION (PCI-S) N/A 05/19/2013   Procedure: PERCUTANEOUS CORONARY STENT INTERVENTION (PCI-S);  Surgeon: Jettie Booze, MD;  Location: [facility] CATH LAB;  Service: Cardiovascular;  Laterality: N/A;   Social History   Occupational History  . Not on file  Tobacco Use  . Smoking status: Former Smoker    Packs/day: 1.00    Years: 12.00    Pack years: 12.00    Types: Cigarettes    Start date: 10/28/1949    Quit date: 10/28/1962    Years since quitting: 57.3  . Smokeless tobacco: Never Used  Substance and Sexual Activity  . Alcohol use: No  . Drug use: No  . Sexual activity: Not on file

## 2020-03-07 ENCOUNTER — Encounter
Payer: Medicare Other | Attending: Physical Medicine and Rehabilitation | Admitting: Physical Medicine and Rehabilitation

## 2020-03-07 ENCOUNTER — Other Ambulatory Visit: Payer: Self-pay

## 2020-03-07 ENCOUNTER — Encounter: Payer: Self-pay | Admitting: Physical Medicine and Rehabilitation

## 2020-03-07 VITALS — BP 160/67 | HR 94 | Temp 97.2°F | Ht 64.0 in | Wt 199.2 lb

## 2020-03-07 DIAGNOSIS — Z79891 Long term (current) use of opiate analgesic: Secondary | ICD-10-CM | POA: Diagnosis present

## 2020-03-07 DIAGNOSIS — G8929 Other chronic pain: Secondary | ICD-10-CM | POA: Insufficient documentation

## 2020-03-07 DIAGNOSIS — M797 Fibromyalgia: Secondary | ICD-10-CM | POA: Insufficient documentation

## 2020-03-07 DIAGNOSIS — M47812 Spondylosis without myelopathy or radiculopathy, cervical region: Secondary | ICD-10-CM

## 2020-03-07 DIAGNOSIS — G894 Chronic pain syndrome: Secondary | ICD-10-CM | POA: Insufficient documentation

## 2020-03-07 DIAGNOSIS — M25512 Pain in left shoulder: Secondary | ICD-10-CM | POA: Diagnosis present

## 2020-03-07 DIAGNOSIS — M47816 Spondylosis without myelopathy or radiculopathy, lumbar region: Secondary | ICD-10-CM

## 2020-03-07 DIAGNOSIS — Z5181 Encounter for therapeutic drug level monitoring: Secondary | ICD-10-CM | POA: Diagnosis present

## 2020-03-07 NOTE — Progress Notes (Signed)
Subjective:    Patient ID: Sheryl Curtis, female    DOB: 1932-07-01, 86 y.o.   MRN: [id number]  HPI  Mrs. Nakima [place] presentsfor follow-up of her fibromyalgia, chronic left shoulder pain, and cervical spondylosis,She has pains in her bilateral shoulders, neck, bilateral hips, knees, ankles, and low back. She has been diagnosed with fibromyalgia, osteoarthritis, and rheumatoid arthritis. Last visit we started her on oxycodone after she has failed or refused most other medications (Lyrica, Tramadol, Tylenol, NSAIDs, Cymbalta, Amitriptyline, Tylenol with codeine, Skelaxin, Flexeril, Tizanidine), and she has noted improvement in her quality of life. She has been going outside more and has been enjoying her new puppy.  She has been taking about 1 oxycodone per day and does not require more medication this visit.   She continues to have neck pain especially when turning her neck to the left side. She had cervical facet joint blocks by Dr. Asa Lente at the [facility] without benefit. She follows with Dr. Lucille Passy her low back pain as well and was seen on 5/6 with plans for lumbar facet joint radiofrequency ablation after insurance authorization.   Pain Inventory Average Pain 9 Pain Right Now 2 My pain is intermittent, sharp and aching  In the last 24 hours, has pain interfered with the following? General activity 9 Relation with others 0 Enjoyment of life 9 What TIME of day is your pain at its worst? daytime Sleep (in general) Fair  Pain is worse with: walking and standing Pain improves with: rest and medication Relief from Meds: 10  Mobility walk without assistance ability to climb steps?  yes do you drive?  no  Function retired I need assistance with the following:  household duties and shopping  Neuro/Psych weakness tremor tingling trouble walking spasms dizziness  Prior Studies Any changes since last visit?  no  Physicians involved in your care Any  changes since last visit?  no   Family History  Problem Relation Age of Onset  . CAD Brother   . Heart attack Brother 23  . Bone cancer Sister   . Throat cancer Daughter    Social History   Socioeconomic History  . Marital status: Widowed    Spouse name: Not on file  . Number of children: 4  . Years of education: Not on file  . Highest education level: Not on file  Occupational History  . Not on file  Tobacco Use  . Smoking status: Former Smoker    Packs/day: 1.00    Years: 12.00    Pack years: 12.00    Types: Cigarettes    Start date: 10/28/1949    Quit date: 10/28/1962    Years since quitting: 57.3  . Smokeless tobacco: Never Used  Substance and Sexual Activity  . Alcohol use: No  . Drug use: No  . Sexual activity: Not on file  Other Topics Concern  . Not on file  Social History Narrative  . Not on file   Social Determinants of Health   Financial Resource Strain:   . Difficulty of Paying Living Expenses:   Food Insecurity: No Food Insecurity  . Worried About Charity fundraiser in the Last Year: Never true  . Ran Out of Food in the Last Year: Never true  Transportation Needs: No Transportation Needs  . Lack of Transportation (Medical): No  . Lack of Transportation (Non-Medical): No  Physical Activity:   . Days of Exercise per Week:   . Minutes of Exercise per Session:  Stress:   . Feeling of Stress :   Social Connections:   . Frequency of Communication with Friends and Family:   . Frequency of Social Gatherings with Friends and Family:   . Attends Religious Services:   . Active Member of Clubs or Organizations:   . Attends Archivist Meetings:   Marland Kitchen Marital Status:    Past Surgical History:  Procedure Laterality Date  . ABDOMINAL HYSTERECTOMY    . ANKLE FRACTURE SURGERY    . BACK SURGERY    . BREAST SURGERY    . COLON SURGERY    . CORONARY STENT PLACEMENT    . PERCUTANEOUS CORONARY STENT INTERVENTION (PCI-S) N/A 05/19/2013   Procedure:  PERCUTANEOUS CORONARY STENT INTERVENTION (PCI-S);  Surgeon: Jettie Booze, MD;  Location: [facility] CATH LAB;  Service: Cardiovascular;  Laterality: N/A;   Past Medical History:  Diagnosis Date  . ALLERGIC RHINITIS   . Atrial fibrillation ([place])   . CANCER, COLORECTAL   . CHEST PAIN, ATYPICAL   . CKD (chronic kidney disease)   . DYSPNEA   . Essential hypertension, benign   . FIBROMYALGIA   . G E R D   . History of stress test    Myoview 8/16:  EF 69%, anterior and apical defect c/w breast attenuation; Low Risk   . INSOMNIA   . Morbid obesity ([place])   . Other and unspecified angina pectoris   . Sleep apnea    BP (!) 160/67   Pulse 94   Temp (!) 97.2 F (36.2 C)   Ht 5\' 4"  (1.626 m)   Wt 199 lb 3.2 oz (90.4 kg)   SpO2 94%   BMI 34.19 kg/m   Opioid Risk Score:   Fall Risk Score:  `1  Depression screen PHQ 2/9  Depression screen [facility] 2/9 03/07/2020 01/21/2020 10/25/2019 07/22/2019 04/21/2019 01/13/2019 01/04/2019  Decreased Interest 0 - 0 0 0 1 0  Down, Depressed, Hopeless 0 2 0 0 0 1 0  PHQ - 2 Score 0 2 0 0 0 2 0  Altered sleeping - 1 - - - 1 -  Tired, decreased energy - 0 - - - 1 -  Change in appetite - 0 - - - 0 -  Feeling bad or failure about yourself  - 1 - - - 1 -  Trouble concentrating - 1 - - - 1 -  Moving slowly or fidgety/restless - 0 - - - 0 -  Suicidal thoughts - 0 - - - 0 -  PHQ-9 Score - 5 - - - 6 -  Difficult doing work/chores - Somewhat difficult - - - Somewhat difficult -  Some recent data might be hidden    Review of Systems  Constitutional: Negative.   HENT: Negative.   Eyes: Negative.   Respiratory: Positive for apnea, shortness of breath and wheezing.   Cardiovascular: Negative.   Gastrointestinal: Positive for constipation.  Endocrine: Negative.   Genitourinary: Negative.   Musculoskeletal: Positive for arthralgias, back pain, gait problem and neck stiffness.  Skin: Negative.   Allergic/Immunologic: Negative.   Neurological: Positive for  dizziness, tremors and weakness.  Hematological: Bruises/bleeds easily.       Plavix  Psychiatric/Behavioral: Negative.   All other systems reviewed and are negative.      Objective:   Physical Exam Gen: no distress, normal appearing HEENT: oral mucosa pink and moist, NCAT Cardio: Reg rate Chest: normal effort, normal rate of breathing Abd: soft, non-distended Ext: no edema Skin: intact  Neuro/MSK: AOx3. Ambulates with antalgic gait but good pace up and down the hallway. Pain worse with extension and oblique extension. Tender points bilaterally throughout upper and lower body.  Psych: pleasant, normal affect       Assessment & Plan:  Mrs. Jolin Kain presents with diffuse pains. She has pains in her bilateral shoulders, neck, bilateral hips, knees, ankles, and low back.  1) Fibromyalgia: --previously discontinued Lyrica given side effects of tremor.  --Had negative side effects in response to Skelaxin, Tramadol, and Tylenol with Codeine since last visit. --Prescribed Oxycodone 7.5mg -she has been taking approx 1 pill per day as needed for severe pain. She still has 1/3 bottle left so her daughter will call me when she requires a refill.  --Discussed that out goal will be to provide pain relief so we can increase her mobility, as exercise is a crucial component of fibromyalgia treatment.Her mobility continues to be limited but her walking speed and demeanor are much improved from prior.   2) Chronic left shoulder pain:I have personally reviewed XR; within normal limits.  3)Lumbar facet arthropathy: She will follow with Dr. Ernestina Patches for radiofrequecny ablation pending authorization.   4) Recent TIA: Previously reviewed medications. She is on statin and Plavix. Advised follow-up with her PCP for better BP control and advised that this is the greatest non-modifiable risk factor for stroke and she is at risk for future strokes given her recent TIA. Advised that she record BP daily  at home and bring to her PCP at next available appointment.   5) Quality of Life: Overall improved. Encourage outdoor mobility and fresh air. Has strong social support of 3 daughters, son, and now new dog which gives her much joy.   All questions were encouraged and answered. Follow up with me in4weeks.

## 2020-03-14 ENCOUNTER — Other Ambulatory Visit: Payer: Self-pay | Admitting: Physician Assistant

## 2020-03-22 ENCOUNTER — Other Ambulatory Visit: Payer: Self-pay | Admitting: *Deleted

## 2020-03-24 NOTE — Patient Outreach (Signed)
[facility]) Care Management  [place]  03/24/2020   Sheryl Curtis Jul 18, 1932 [id number]  RN Health Coach telephone call to patient. Patient was sleeping per daughter Sheryl Curtis.  [employer] compliance verified. Per Sheryl Curtis her mother didn't sleep well last night and was getting some rest. RN asked Sheryl Curtis if patient has a medical alert. Per daughter she does.  Per daughter patient will be getting hearing aids. Daughter has agreed to further outreach calls.    Encounter Medications:  Outpatient Encounter Medications as of 03/22/2020  Medication Sig Note  . acetaminophen (TYLENOL) 650 MG CR tablet Take 650 mg by mouth every 8 (eight) hours as needed for pain.   Marland Kitchen albuterol (PROVENTIL) (2.5 MG/3ML) 0.083% nebulizer solution USE 1 VIAL VIA NEBULIZER EVERY 6 HOURS AS NEEDED (Patient taking differently: Take 2.5 mg by nebulization in the morning and at bedtime. ) 01/27/2020: Patient states she takes everyday   . Albuterol Sulfate (PROAIR RESPICLICK) 123XX123 (90 Base) MCG/ACT AEPB Inhale 1-2 puffs into the lungs every 6 (six) hours as needed (for shortness of breath and/or wheezing.). (Patient taking differently: Inhale 1 puff into the lungs in the morning and at bedtime. )   . ALPRAZolam (XANAX) 0.25 MG tablet Take 0.25 mg by mouth 3 (three) times daily as needed for anxiety.   . Ascorbic Acid (VITAMIN C) 1000 MG tablet Take 1,000 mg by mouth daily.   . budesonide-formoterol (SYMBICORT) 160-4.5 MCG/ACT inhaler Inhale 2 puffs into the lungs 2 (two) times daily.   . Cholecalciferol (VITAMIN D) 50 MCG (2000 UT) tablet Take 2,000 Units by mouth daily.   . clopidogrel (PLAVIX) 75 MG tablet TAKE 1 TABLET(75 MG) BY MOUTH EVERY OTHER DAY (Patient taking differently: Take 75 mg by mouth daily. )   . diphenhydrAMINE (BENADRYL ALLERGY) 25 mg capsule Take 1 capsule (25 mg total) by mouth at bedtime as needed.   . DULoxetine (CYMBALTA) 60 MG capsule Take 60 mg by mouth daily.   . furosemide (LASIX) 40 MG  tablet TAKE 1 TABLET BY MOUTH  DAILY   . ipratropium (ATROVENT) 0.03 % nasal spray Place 2 sprays into both nostrils every 12 (twelve) hours. (Patient not taking: Reported on 03/07/2020)   . isosorbide mononitrate (IMDUR) 60 MG 24 hr tablet TAKE 1 TABLET BY MOUTH  DAILY   . Nebulizers (COMPRESSOR NEBULIZER) MISC 1 Device by Does not apply route once.   . nitroGLYCERIN (NITROSTAT) 0.4 MG SL tablet Place 1 tablet (0.4 mg total) under the tongue every 5 (five) minutes as needed for chest pain.   . Omega-3 Fatty Acids (FISH OIL) 1200 MG CAPS Take 1,200 mg by mouth daily.   Marland Kitchen oxyCODONE-acetaminophen (PERCOCET) 7.5-325 MG tablet Take 1 tablet by mouth every 4 (four) hours as needed for severe pain. 03/07/2020: Did not bring bottle. Reminded to bring to each visit even if empty. Per PMP #20 filled 02/11/20  . polyethylene glycol (MIRALAX / GLYCOLAX) 17 g packet Take 17 g by mouth daily as needed for mild constipation.    . potassium chloride SA (KLOR-CON) 20 MEQ tablet TAKE 1 TABLET BY MOUTH  DAILY   . Respiratory Therapy Supplies (FLUTTER) DEVI Use flutter device 3 times a day   . rosuvastatin (CRESTOR) 20 MG tablet Take 1 tablet (20 mg total) by mouth daily.   . valsartan-hydrochlorothiazide (DIOVAN-HCT) 160-12.5 MG tablet Take 1 tablet by mouth daily.    No facility-administered encounter medications on file as of 03/22/2020.    Functional Status:  In your present state of health, do you have any difficulty performing the following activities: 10/25/2019 07/22/2019  Hearing? [name]  Vision? N -  Difficulty concentrating or making decisions? N N  Walking or climbing stairs? Y Y  Comment due to arthritis and pain arthritis and back painn radiating down leg  Dressing or bathing? N N  Doing errands, shopping? [name]  Comment daughter Sheryl Curtis runs all errands daughter Sheryl Curtis assists  Conservation officer, nature and eating ? N N  Using the Toilet? N N  In the past six months, have you accidently leaked urine? Y Y  Do you  have problems with loss of bowel control? N N  Managing your Medications? N N  Managing your Finances? N N  Housekeeping or managing your Housekeeping? [name] helps out daughters help out  Some recent data might be hidden    Fall/Depression Screening: Fall Risk  03/07/2020 01/21/2020 11/30/2019  Falls in the past year? 0 1 0  Comment - - -  Number falls in past yr: - 1 -  Injury with Fall? - 1 -  Comment - - -  Risk Factor Category  - - -  Risk for fall due to : History of fall(s) History of fall(s);Impaired balance/gait;Impaired mobility -  Risk for fall due to: Comment - - -  Follow up - Falls evaluation completed;Falls prevention discussed -  Comment - - -   PHQ 2/9 Scores 03/07/2020 01/21/2020 10/25/2019 07/22/2019 04/21/2019 01/13/2019 01/04/2019  PHQ - 2 Score 0 2 0 0 0 2 0  PHQ- 9 Score - 5 - - - 6 -  Exception Documentation - - - - - Patient refusal -    Plan:   RN will follow up outreach within the month of June  James Lafalce [facility] [phone]

## 2020-03-29 ENCOUNTER — Other Ambulatory Visit: Payer: Self-pay | Admitting: Physical Medicine and Rehabilitation

## 2020-03-29 MED ORDER — OXYCODONE-ACETAMINOPHEN 7.5-325 MG PO TABS: 1.0000 | ORAL_TABLET | ORAL | 0 refills | Status: DC | PRN

## 2020-04-05 ENCOUNTER — Encounter
Payer: Medicare Other | Attending: Physical Medicine and Rehabilitation | Admitting: Physical Medicine and Rehabilitation

## 2020-04-05 ENCOUNTER — Other Ambulatory Visit: Payer: Self-pay

## 2020-04-05 ENCOUNTER — Encounter: Payer: Self-pay | Admitting: Physical Medicine and Rehabilitation

## 2020-04-05 VITALS — BP 94/55 | HR 77 | Temp 97.2°F | Ht 64.0 in | Wt 202.4 lb

## 2020-04-05 DIAGNOSIS — M47816 Spondylosis without myelopathy or radiculopathy, lumbar region: Secondary | ICD-10-CM | POA: Diagnosis not present

## 2020-04-05 DIAGNOSIS — M25512 Pain in left shoulder: Secondary | ICD-10-CM | POA: Insufficient documentation

## 2020-04-05 DIAGNOSIS — Z79891 Long term (current) use of opiate analgesic: Secondary | ICD-10-CM | POA: Insufficient documentation

## 2020-04-05 DIAGNOSIS — M47812 Spondylosis without myelopathy or radiculopathy, cervical region: Secondary | ICD-10-CM

## 2020-04-05 DIAGNOSIS — G8929 Other chronic pain: Secondary | ICD-10-CM | POA: Insufficient documentation

## 2020-04-05 DIAGNOSIS — G894 Chronic pain syndrome: Secondary | ICD-10-CM | POA: Insufficient documentation

## 2020-04-05 DIAGNOSIS — Z5181 Encounter for therapeutic drug level monitoring: Secondary | ICD-10-CM | POA: Insufficient documentation

## 2020-04-05 DIAGNOSIS — M48061 Spinal stenosis, lumbar region without neurogenic claudication: Secondary | ICD-10-CM

## 2020-04-05 DIAGNOSIS — M797 Fibromyalgia: Secondary | ICD-10-CM | POA: Diagnosis not present

## 2020-04-05 MED ORDER — DEBROX 6.5 % OT SOLN: 5.0000 [drp] | Freq: Two times a day (BID) | OTIC | 0 refills | Status: DC

## 2020-04-05 MED ORDER — AMITRIPTYLINE HCL 10 MG PO TABS: 10.0000 mg | ORAL_TABLET | Freq: Every day | ORAL | 0 refills | Status: DC

## 2020-04-05 MED ORDER — OXYCODONE-ACETAMINOPHEN 7.5-325 MG PO TABS: 1.0000 | ORAL_TABLET | Freq: Two times a day (BID) | ORAL | 0 refills | Status: DC | PRN

## 2020-04-05 NOTE — Progress Notes (Signed)
Subjective:    Patient ID: Sheryl Curtis, female    DOB: May 14, 1932, 86 y.o.   MRN: [id number]  HPI Sheryl Curtis presents for follow-up of her fibromyalgia, chronic left shoulder pain, and cervical spondylosis. She has pain in her bilateral shoulders, neck, bilateral hips, knees, ankles, and low back. She has been diagnosed with fibromyalgia, osteoarthritis, and rheumatoid arthritis. Previously I started her on Percocet after she has failed or refused most other medications (Lyrica, Tramadol, Tylenol, NSAIDs, Cymbalta, Amitriptyline, Tylenol with codeine, Skelaxin, Flexeril, Tizanidine), and she has noted improvement in her quality of life. She has been going outside more and has been enjoying her new puppy.  She has been taking about 1 Percocet per day.   She continues to have neck pain especially when turning her neck to the left side. This pain has prevented her from driving and is currently her most severe source of pain. She had cervical facet joint blocks by Dr. Asa Lente at the [facility] without benefit. She follows with Dr. Ernestina Patches for her low back pain as well and was seen on 5/6 with plans for lumbar facet joint radiofrequency ablation after insurance authorization.  She has been sleeping poorly over the last few days and pain levels have also increased. She lives with her daughter. She has a bedside commode for urination at night.   Pain Inventory Average Pain 9 Pain Right Now 8 My pain is constant and aching  In the last 24 hours, has pain interfered with the following? General activity 9 Relation with others 3 Enjoyment of life 8 What TIME of day is your pain at its worst? morning Sleep (in general) Poor  Pain is worse with: walking, standing and some activites Pain improves with: rest and medication Relief from Meds: 0  Mobility walk with assistance use a cane use a walker how many minutes can you walk? less than 5  mins  Function retired  Neuro/Psych weakness tremor confusion  Prior Studies Any changes since last visit?  no  Physicians involved in your care Any changes since last visit?  yes   Family History  Problem Relation Age of Onset  . CAD Brother   . Heart attack Brother 54  . Bone cancer Sister   . Throat cancer Daughter    Social History   Socioeconomic History  . Marital status: Widowed    Spouse name: Not on file  . Number of children: 4  . Years of education: Not on file  . Highest education level: Not on file  Occupational History  . Not on file  Tobacco Use  . Smoking status: Former Smoker    Packs/day: 1.00    Years: 12.00    Pack years: 12.00    Types: Cigarettes    Start date: 10/28/1949    Quit date: 10/28/1962    Years since quitting: 57.4  . Smokeless tobacco: Never Used  Substance and Sexual Activity  . Alcohol use: No  . Drug use: No  . Sexual activity: Not on file  Other Topics Concern  . Not on file  Social History Narrative  . Not on file   Social Determinants of Health   Financial Resource Strain:   . Difficulty of Paying Living Expenses:   Food Insecurity: No Food Insecurity  . Worried About Charity fundraiser in the Last Year: Never true  . Ran Out of Food in the Last Year: Never true  Transportation Needs: No Transportation Needs  .  Lack of Transportation (Medical): No  . Lack of Transportation (Non-Medical): No  Physical Activity:   . Days of Exercise per Week:   . Minutes of Exercise per Session:   Stress:   . Feeling of Stress :   Social Connections:   . Frequency of Communication with Friends and Family:   . Frequency of Social Gatherings with Friends and Family:   . Attends Religious Services:   . Active Member of Clubs or Organizations:   . Attends Archivist Meetings:   Marland Kitchen Marital Status:    Past Surgical History:  Procedure Laterality Date  . ABDOMINAL HYSTERECTOMY    . ANKLE FRACTURE SURGERY    . BACK  SURGERY    . BREAST SURGERY    . COLON SURGERY    . CORONARY STENT PLACEMENT    . PERCUTANEOUS CORONARY STENT INTERVENTION (PCI-S) N/A 05/19/2013   Procedure: PERCUTANEOUS CORONARY STENT INTERVENTION (PCI-S);  Surgeon: Jettie Booze, MD;  Location: [facility] CATH LAB;  Service: Cardiovascular;  Laterality: N/A;   Past Medical History:  Diagnosis Date  . ALLERGIC RHINITIS   . Atrial fibrillation ([place])   . CANCER, COLORECTAL   . CHEST PAIN, ATYPICAL   . CKD (chronic kidney disease)   . DYSPNEA   . Essential hypertension, benign   . FIBROMYALGIA   . G E R D   . History of stress test    Myoview 8/16:  EF 69%, anterior and apical defect c/w breast attenuation; Low Risk   . INSOMNIA   . Morbid obesity ([place])   . Other and unspecified angina pectoris   . Sleep apnea    Temp (!) 97.2 F (36.2 C)   Ht 5\' 4"  (1.626 m)   Wt 202 lb 6.4 oz (91.8 kg)   BMI 34.74 kg/m   Opioid Risk Score:   Fall Risk Score:  `1  Depression screen PHQ 2/9  Depression screen [facility] 2/9 03/07/2020 01/21/2020 10/25/2019 07/22/2019 04/21/2019 01/13/2019 01/04/2019  Decreased Interest 0 - 0 0 0 1 0  Down, Depressed, Hopeless 0 2 0 0 0 1 0  PHQ - 2 Score 0 2 0 0 0 2 0  Altered sleeping - 1 - - - 1 -  Tired, decreased energy - 0 - - - 1 -  Change in appetite - 0 - - - 0 -  Feeling bad or failure about yourself  - 1 - - - 1 -  Trouble concentrating - 1 - - - 1 -  Moving slowly or fidgety/restless - 0 - - - 0 -  Suicidal thoughts - 0 - - - 0 -  PHQ-9 Score - 5 - - - 6 -  Difficult doing work/chores - Somewhat difficult - - - Somewhat difficult -  Some recent data might be hidden   Review of Systems  Musculoskeletal:       Spasms, cramps  Neurological:       Tingling       Objective:   Physical Exam Gen: no distress, normal appearing HEENT: oral mucosa pink and moist, NCAT Cardio: Reg rate Chest: normal effort, normal rate of breathing Abd: soft, non-distended Ext: no edema Skin: intact Neuro/MSK:  AOx3. Ambulates with antalgic gait but good pace up and down the hallway. Pain worse with extension and oblique extension. Tender points bilaterally throughout upper and lower body.  Severely limited lateral rotation of cervical spine, particularly to left side. Tenderness to palpation over lateral facet joints.  Psych: pleasant, normal  affect    Assessment & Plan:  Sheryl Curtis presents with diffuse pains. She has pains in her bilateral shoulders, neck, bilateral hips, knees, ankles, and low back.    1) Fibromyalgia: --previously discontinued Lyrica given side effects of tremor.  --Had negative side effects in response to Skelaxin, Tramadol, and Tylenol with Codeine since last visit. --Prescribed Oxycodone 7.5mg -she has been taking approx 1 pill per day as needed for severe pain. Provided with refill. Can take up to twice per day if needed.  --Discussed that out goal will be to provide pain relief so we can increase her mobility, as exercise is a crucial component of fibromyalgia treatment. Her mobility continues to be limited but her walking speed and demeanor are much improved from prior.    2) Chronic left shoulder pain: I have personally reviewed XR; within normal limits.    3) Lumbar facet arthropathy: She will follow with Dr. Ernestina Patches for radiofrequency ablation pending authorization.    4) Recent TIA: Previously reviewed medications. She is on statin and Plavix. Advised follow-up with her PCP for better BP control and advised that this is the greatest non-modifiable risk factor for stroke and she is at risk for future strokes given her recent TIA. Advised that she record BP daily at home and bring to her PCP at next available appointment.    5) Quality of Life: Overall improved. Encourage outdoor mobility and fresh air. Has strong social support of 3 daughters, son, and now new dog which gives her much joy.    6) Cervical facet arthritis, worse on left side, now contributing to  cervicogenic headaches. Discuss with Dr. Ernestina Patches whether medial branch blocks would be beneficial.   7) Insomnia: prescribed Amitriptyline 10mg  HS. Advised regarding cardiac side effects and risk of falls but patient prefers to try medication prior to clearance from cardiologist. Advised her daughter to monitor her first night to decrease risk of fall as she sometimes rises to use bedside commode at night.   All questions were encouraged and answered. Follow up with me in 4 weeks.

## 2020-04-06 ENCOUNTER — Encounter: Payer: Self-pay | Admitting: Physical Medicine and Rehabilitation

## 2020-04-10 ENCOUNTER — Telehealth: Payer: Self-pay | Admitting: Pulmonary Disease

## 2020-04-10 NOTE — Telephone Encounter (Addendum)
Called [employer] at [phone] in regards to pt's application that was submitted. Spoke with Genoveva Ill to check status of application. Per Genoveva Ill, pages 4 and 5 were missing from pt's application. This had been scanned into pt's chart.  I have printed this off and have faxed to [employer] for pt.  Attempted to call pt's daughter [name] but unable to reach. Left message for her to return call.  I accidentally closed the encounter so keeping encounter in triage.

## 2020-04-11 ENCOUNTER — Telehealth: Payer: Self-pay | Admitting: Pulmonary Disease

## 2020-04-11 NOTE — Telephone Encounter (Signed)
Daughter Sheryl Curtis contacted and situation with [employer] explained. Sheryl Curtis is struggling with the cost of her Symbicort. Will wait for response from [employer].

## 2020-04-11 NOTE — Telephone Encounter (Signed)
See previous encounter addressing application to [employer].

## 2020-04-19 ENCOUNTER — Ambulatory Visit: Payer: Medicare Other | Admitting: Orthopaedic Surgery

## 2020-04-19 ENCOUNTER — Ambulatory Visit: Payer: Self-pay

## 2020-04-19 ENCOUNTER — Encounter: Payer: Self-pay | Admitting: Orthopaedic Surgery

## 2020-04-19 ENCOUNTER — Other Ambulatory Visit: Payer: Self-pay

## 2020-04-19 ENCOUNTER — Telehealth: Payer: Self-pay | Admitting: Pulmonary Disease

## 2020-04-19 DIAGNOSIS — M542 Cervicalgia: Secondary | ICD-10-CM

## 2020-04-19 MED ORDER — STIOLTO RESPIMAT 2.5-2.5 MCG/ACT IN AERS: 2.0000 | INHALATION_SPRAY | Freq: Every day | RESPIRATORY_TRACT | 0 refills | Status: DC

## 2020-04-19 MED ORDER — GABAPENTIN 300 MG PO CAPS: 300.0000 mg | ORAL_CAPSULE | Freq: Every day | ORAL | 1 refills | Status: DC

## 2020-04-19 NOTE — Telephone Encounter (Signed)
Spoke with Karma Greaser, patient's daughter and listed on DPR.  She is calling about the status of the application for the [place]. I called [employer] at [phone] and was told they did not have an application in their system under the name and dob of the patient provided.  I was provided with an expedited fax # to resend the application:  [phone].  Original application faxed on March 23rd, 2021.  Faxed a 2nd time on 04/10/20.

## 2020-04-19 NOTE — Progress Notes (Signed)
Office Visit Note   Patient: Sheryl Curtis           Date of Birth: 1932-01-18           MRN: [id number] Visit Date: 04/19/2020              Requested by: Alroy Dust, L.Marlou Sa, [place] [address] [employer] [address],  [place] [address] PCP: Alroy Dust, L.Marlou Sa, MD   Assessment & Plan: Visit Diagnoses:  1. Cervicalgia     Plan: She has not had a MRI of her cervical spine so I do feel this is worthwhile at this standpoint given the severity of the disease we are seeing on the CT angiogram and plain films.  That way we can at least determine the degree of nerve compression or even potential for cord myelopathy to then make a better determination of what her options are and the treatment plan.  I will start her back on Neurontin at 300 mg to take just at nighttime.  We will see her back in 2 weeks after hopefully having a MRI performed of her cervical spine.  Follow-Up Instructions: Return in about 2 weeks (around 05/03/2020).   Orders:  Orders Placed This Encounter  Procedures  . XR Cervical Spine 2 or 3 views   Meds ordered this encounter  Medications  . gabapentin (NEURONTIN) 300 MG capsule    Sig: Take 1 capsule (300 mg total) by mouth at bedtime.    Dispense:  60 capsule    Refill:  1      Procedures: No procedures performed   Clinical Data: No additional findings.   Subjective: Chief Complaint  Patient presents with  . Neck - Pain  The patient is someone of seen in the past for other reasons.  She comes in with chronic left-sided neck pain and head pain and headaches that have been occurring for a long period time but slowly worse.  She says her neck is become quite stiff to the point she cannot even rotate it much to the left side.  She is 86 years old.  She is on chronic narcotic pain medication.  She cannot take anti-inflammatories due to the fact she is on Plavix.  She took Neurontin years ago but is not sure why she stopped it.  She has been frustrated amount of pain that  she has and how this is detrimentally affecting her mobility and her quality of life.  Dr. Ernestina Patches has seen her in the past and actually sent [facility] but I am not sure what the results of that visit was.  She has had an MRI of her brain but not her cervical spine.  She had a recent CT angiogram of her neck as well.  HPI  Review of Systems She currently denies any chest pain, shortness of breath, fever, chills, nausea, vomiting  Objective: Vital Signs: There were no vitals taken for this visit.  Physical Exam She is alert and orient x3 and in no acute distress Ortho Exam Examination of her cervical spine is very painful to palpation and lateral rotation and lateral bending and it is quite stiff as well.  She has some weakness on exam of both her arms but not severe weakness. Specialty Comments:  No specialty comments available.  Imaging: XR Cervical Spine 2 or 3 views  Result Date: 04/19/2020 2 views of the cervical spine show severe degenerative changes at multiple levels but mainly at C3-C4 and C4-C5.  PMFS History: Patient Active Problem List   Diagnosis Date Noted  . Mild persistent asthma without complication 87/86/7672  . Dysphagia 06/14/2019  . Neck pain, chronic 04/21/2019  . Physical deconditioning 12/09/2018  . CKD (chronic kidney disease)   . Morbid obesity ([place])   . Mild persistent asthma with (acute) exacerbation 07/16/2018  . Cough 07/16/2018  . OSA (obstructive sleep apnea) 07/30/2017  . Trochanteric bursitis, right hip 04/21/2017  . Nocturnal hypoxia 03/31/2017  . Sinus bradycardia 08/05/2016  . Hypertensive urgency 08/03/2016  . Asthma 02/19/2016  . Chronic kidney disease (CKD), stage III (moderate) 07/05/2015  . CAFL (chronic airflow limitation) ([place]) 07/05/2015  . Generalized OA 07/05/2015  . 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 H/O malignant neoplasm of colon 07/05/2015  . Elevated troponin 07/02/2015  . UTI (lower urinary tract  infection) 07/01/2015  . Fibromyalgia syndrome 07/01/2015  . Hyponatremia 07/01/2015  . Urinary tract infectious disease   . Pulmonary nodules/lesions, multiple 04/11/2014  . Abdominal pain, generalized 04/07/2014  . Coronary atherosclerosis of native coronary artery 10/12/2013  . Essential hypertension, benign   . Other and unspecified angina pectoris   . Atrial fibrillation ([place]) 04/19/2010  . Allergic rhinitis 04/19/2010  . DYSPNEA 04/19/2010  . CHEST PAIN, ATYPICAL 04/19/2010  . CANCER, COLORECTAL 04/18/2010  . Essential hypertension 04/18/2010  . G E R D 04/18/2010  . Fibromyalgia 04/18/2010  . INSOMNIA 04/18/2010   Past Medical History:  Diagnosis Date  . ALLERGIC RHINITIS   . Atrial fibrillation ([place])   . CANCER, COLORECTAL   . CHEST PAIN, ATYPICAL   . CKD (chronic kidney disease)   . DYSPNEA   . Essential hypertension, benign   . FIBROMYALGIA   . G E R D   . History of stress test    Myoview 8/16:  EF 69%, anterior and apical defect c/w breast attenuation; Low Risk   . INSOMNIA   . Morbid obesity ([place])   . Other and unspecified angina pectoris   . Sleep apnea     Family History  Problem Relation Age of Onset  . CAD Brother   . Heart attack Brother 20  . Bone cancer Sister   . Throat cancer Daughter     Past Surgical History:  Procedure Laterality Date  . ABDOMINAL HYSTERECTOMY    . ANKLE FRACTURE SURGERY    . BACK SURGERY    . BREAST SURGERY    . COLON SURGERY    . CORONARY STENT PLACEMENT    . PERCUTANEOUS CORONARY STENT INTERVENTION (PCI-S) N/A 05/19/2013   Procedure: PERCUTANEOUS CORONARY STENT INTERVENTION (PCI-S);  Surgeon: Jettie Booze, MD;  Location: [facility] CATH LAB;  Service: Cardiovascular;  Laterality: N/A;   Social History   Occupational History  . Not on file  Tobacco Use  . Smoking status: Former Smoker    Packs/day: 1.00    Years: 12.00    Pack years: 12.00    Types: Cigarettes    Start date: 10/28/1949    Quit date: 10/28/1962     Years since quitting: 57.5  . Smokeless tobacco: Never Used  Vaping Use  . Vaping Use: Never used  Substance and Sexual Activity  . Alcohol use: No  . Drug use: No  . Sexual activity: Not on file

## 2020-04-19 NOTE — Telephone Encounter (Signed)
Called patient's daughter back and updated her about when the application was faxed and that it is a very lengthy process.  I let her know that I put some samples at the front desk that she can come by and pick up for her mom at her convince.  She verbalized understanding.  Nothing further needed.

## 2020-04-19 NOTE — Addendum Note (Signed)
Addended by: Vanessa Barbara on: 04/19/2020 04:40 PM   Modules accepted: Orders

## 2020-04-20 ENCOUNTER — Telehealth: Payer: Self-pay | Admitting: Physical Medicine and Rehabilitation

## 2020-04-20 NOTE — Telephone Encounter (Signed)
Looks like she needs auth for Bilateral L4-5 RFA.

## 2020-04-20 NOTE — Telephone Encounter (Signed)
Patient called.   Requesting a call back to set up another back injection   Call back: [phone]

## 2020-04-24 NOTE — Telephone Encounter (Signed)
For code [phone], Notification or Prior Authorization is not required for the requested services  Decision ID #:[id number]  The number above acknowledges your inquiry and our response. Please write this number down and refer to it for future inquiries. Coverage and payment for an item or service is governed by the member's benefit plan document, and, if applicable, the provider's participation agreement with the Health Plan.

## 2020-04-24 NOTE — Telephone Encounter (Signed)
Called pt and lvm #1 to get pt scheduled for RFA.

## 2020-04-26 ENCOUNTER — Other Ambulatory Visit: Payer: Self-pay | Admitting: *Deleted

## 2020-04-26 NOTE — Patient Outreach (Signed)
[facility]) Care Management  [place]  04/26/2020   Sheryl Curtis July 14, 1932 [id number]  RN Health Coach telephone call to patient.  [employer] compliance verified. Per daughter [name] patient was on another line. RN spoke with [name]. Mother has had 2 recent falls since last outreach. Patient only had bruises no major injuries. Per daughter the mother bent over to far and lost balance. Patient is eating fair. She has no weight loss.  RN explained to daughter about the caregiver benefit that the social worker had discussed with patient. RN reiterated the benefits of social worker and pharmacy to daughter. Patient daughter agreed to follow up outreach calls.    Encounter Medications:  Outpatient Encounter Medications as of 04/26/2020  Medication Sig Note  . acetaminophen (TYLENOL) 650 MG CR tablet Take 650 mg by mouth every 8 (eight) hours as needed for pain.   Marland Kitchen albuterol (PROVENTIL) (2.5 MG/3ML) 0.083% nebulizer solution USE 1 VIAL VIA NEBULIZER EVERY 6 HOURS AS NEEDED (Patient taking differently: Take 2.5 mg by nebulization in the morning and at bedtime. ) 01/27/2020: Patient states she takes everyday   . Albuterol Sulfate (PROAIR RESPICLICK) 412 (90 Base) MCG/ACT AEPB Inhale 1-2 puffs into the lungs every 6 (six) hours as needed (for shortness of breath and/or wheezing.). (Patient taking differently: Inhale 1 puff into the lungs in the morning and at bedtime. )   . ALPRAZolam (XANAX) 0.25 MG tablet Take 0.25 mg by mouth 3 (three) times daily as needed for anxiety.   Marland Kitchen amitriptyline (ELAVIL) 10 MG tablet Take 1 tablet (10 mg total) by mouth at bedtime.   . Ascorbic Acid (VITAMIN C) 1000 MG tablet Take 1,000 mg by mouth daily.   . budesonide-formoterol (SYMBICORT) 160-4.5 MCG/ACT inhaler Inhale 2 puffs into the lungs 2 (two) times daily.   . carbamide peroxide (DEBROX) 6.5 % OTIC solution Place 5 drops into both ears 2 (two) times daily.   . Cholecalciferol (VITAMIN D) 50 MCG  (2000 UT) tablet Take 2,000 Units by mouth daily.   . clopidogrel (PLAVIX) 75 MG tablet TAKE 1 TABLET(75 MG) BY MOUTH EVERY OTHER DAY (Patient taking differently: Take 75 mg by mouth daily. )   . diphenhydrAMINE (BENADRYL ALLERGY) 25 mg capsule Take 1 capsule (25 mg total) by mouth at bedtime as needed.   . DULoxetine (CYMBALTA) 60 MG capsule Take 60 mg by mouth daily.   . furosemide (LASIX) 40 MG tablet TAKE 1 TABLET BY MOUTH  DAILY   . gabapentin (NEURONTIN) 300 MG capsule Take 1 capsule (300 mg total) by mouth at bedtime.   Marland Kitchen ipratropium (ATROVENT) 0.03 % nasal spray Place 2 sprays into both nostrils every 12 (twelve) hours.   . isosorbide mononitrate (IMDUR) 60 MG 24 hr tablet TAKE 1 TABLET BY MOUTH  DAILY   . Nebulizers (COMPRESSOR NEBULIZER) MISC 1 Device by Does not apply route once.   . nitroGLYCERIN (NITROSTAT) 0.4 MG SL tablet Place 1 tablet (0.4 mg total) under the tongue every 5 (five) minutes as needed for chest pain.   . Omega-3 Fatty Acids (FISH OIL) 1200 MG CAPS Take 1,200 mg by mouth daily.   Marland Kitchen oxyCODONE-acetaminophen (PERCOCET) 7.5-325 MG tablet Take 1 tablet by mouth 2 (two) times daily as needed for severe pain.   . polyethylene glycol (MIRALAX / GLYCOLAX) 17 g packet Take 17 g by mouth daily as needed for mild constipation.    . potassium chloride SA (KLOR-CON) 20 MEQ tablet TAKE 1 TABLET BY  MOUTH  DAILY   . Respiratory Therapy Supplies (FLUTTER) DEVI Use flutter device 3 times a day   . rosuvastatin (CRESTOR) 20 MG tablet Take 1 tablet (20 mg total) by mouth daily.   . Tiotropium Bromide-Olodaterol (STIOLTO RESPIMAT) 2.5-2.5 MCG/ACT AERS Inhale 2 puffs into the lungs daily.   . Tiotropium Bromide-Olodaterol (STIOLTO RESPIMAT) 2.5-2.5 MCG/ACT AERS Inhale 2 puffs into the lungs daily.   . valsartan-hydrochlorothiazide (DIOVAN-HCT) 160-12.5 MG tablet Take 1 tablet by mouth daily.    No facility-administered encounter medications on file as of 04/26/2020.    Functional Status:   In your present state of health, do you have any difficulty performing the following activities: 10/25/2019 07/22/2019  Hearing? [name]  Vision? N -  Difficulty concentrating or making decisions? N N  Walking or climbing stairs? Y Y  Comment due to arthritis and pain arthritis and back painn radiating down leg  Dressing or bathing? N N  Doing errands, shopping? [name]  Comment daughter [name] runs all errands daughter [name] assists  Conservation officer, nature and eating ? N N  Using the Toilet? N N  In the past six months, have you accidently leaked urine? Y Y  Do you have problems with loss of bowel control? N N  Managing your Medications? N N  Managing your Finances? N N  Housekeeping or managing your Housekeeping? [name] helps out daughters help out  Some recent data might be hidden    Fall/Depression Screening: Fall Risk  04/26/2020 04/05/2020 03/07/2020  Falls in the past year? 1 1 0  Comment - 2 days ago in AM -  Number falls in past yr: 1 - -  Injury with Fall? 1 - -  Comment - - -  Risk Factor Category  - - -  Risk for fall due to : History of fall(s);Impaired balance/gait;Impaired mobility - History of fall(s)  Risk for fall due to: Comment - - -  Follow up Falls evaluation completed;Falls prevention discussed - -  Comment - - -   PHQ 2/9 Scores 03/07/2020 01/21/2020 10/25/2019 07/22/2019 04/21/2019 01/13/2019 01/04/2019  PHQ - 2 Score 0 2 0 0 0 2 0  PHQ- 9 Score - 5 - - - 6 -  Exception Documentation - - - - - Patient refusal -   THN CM Care Plan Problem One     Most Recent Value  Care Plan Problem One Knowledge deficit in self management of hypertension  Role Documenting the Problem One [place] for Problem One Active  THN Long Term Goal  Patient will follow up on health maintenance within the next 90 days  THN Long Term Goal Start Date 04/26/20  Interventions for Problem One Long Term Goal RN discussed health maintenance. RN discussed COVID vaccine. RN sent  reminders for eye examination. RN will follow up and encourage care.  THN CM Short Term Goal #1  Patient will not have any falls within the next 30 days  Interventions for Short Term Goal #1 RN reiterated fall prevention. Patient has fell twice since last outreach.  THN CM Short Term Goal #2  Patient will verbalize checking blood pressure at least 3x week within the next 30 days  Interventions for Short Term Goal #2 RN has reiterated monitoring blood pressure more frequently. Patientonly wants to monitor blood pressure when symptomatic. RN will follow up for further discussion       Assessment: Patient had recent fall with bruising Appetite far  Patient is having neck pain Patient checks blood pressures when she is symptomatic  Plan:  RN discussed medical alert system RN discussed caregiver benefits to patient daughter as explained by social worker RN discussed COVID vaccine RN discussed medication adherence RN will follow up with further outreach in September  Joslynn Jamroz [place] [facility] [phone]

## 2020-04-27 ENCOUNTER — Telehealth: Payer: Self-pay | Admitting: Physical Medicine and Rehabilitation

## 2020-04-27 NOTE — Telephone Encounter (Signed)
Called pt daughter (betty) and lvm

## 2020-04-27 NOTE — Telephone Encounter (Signed)
Patient's daughter Sheryl Curtis called back. She said her phone never rang but did receive voicemail. Please give Sheryl Curtis a call back to set appt for her mother. Sheryl Curtis phone number is [phone]

## 2020-04-27 NOTE — Telephone Encounter (Signed)
Pt is scheduled for 06/14/20 with driver. Pt has been placed on the wait list and pt daughter would like to be called if any cancellation come up at [phone]

## 2020-04-27 NOTE — Telephone Encounter (Signed)
Patient's daughter [name] called to set an appt for patient. [name] was told a vm was left on machine to call back to set appt. [name] asked for [place] or L[place] Sousa call back to set appt. [name] phone number is [phone].

## 2020-05-03 ENCOUNTER — Encounter: Payer: Self-pay | Admitting: Physical Medicine and Rehabilitation

## 2020-05-03 ENCOUNTER — Encounter
Payer: Medicare Other | Attending: Physical Medicine and Rehabilitation | Admitting: Physical Medicine and Rehabilitation

## 2020-05-03 ENCOUNTER — Other Ambulatory Visit: Payer: Self-pay

## 2020-05-03 VITALS — BP 126/56 | HR 78 | Temp 97.6°F | Ht 64.0 in | Wt 209.0 lb

## 2020-05-03 DIAGNOSIS — G8929 Other chronic pain: Secondary | ICD-10-CM | POA: Diagnosis present

## 2020-05-03 DIAGNOSIS — M47812 Spondylosis without myelopathy or radiculopathy, cervical region: Secondary | ICD-10-CM

## 2020-05-03 DIAGNOSIS — Z5181 Encounter for therapeutic drug level monitoring: Secondary | ICD-10-CM | POA: Diagnosis present

## 2020-05-03 DIAGNOSIS — Z79891 Long term (current) use of opiate analgesic: Secondary | ICD-10-CM | POA: Diagnosis present

## 2020-05-03 DIAGNOSIS — G894 Chronic pain syndrome: Secondary | ICD-10-CM | POA: Insufficient documentation

## 2020-05-03 DIAGNOSIS — F1190 Opioid use, unspecified, uncomplicated: Secondary | ICD-10-CM | POA: Diagnosis not present

## 2020-05-03 DIAGNOSIS — M25512 Pain in left shoulder: Secondary | ICD-10-CM

## 2020-05-03 DIAGNOSIS — M797 Fibromyalgia: Secondary | ICD-10-CM | POA: Diagnosis present

## 2020-05-03 MED ORDER — OXYCODONE-ACETAMINOPHEN 7.5-325 MG PO TABS: 1.0000 | ORAL_TABLET | Freq: Two times a day (BID) | ORAL | 0 refills | Status: DC | PRN

## 2020-05-03 MED ORDER — SENNA 8.6 MG PO TABS: 2.0000 | ORAL_TABLET | Freq: Every day | ORAL | 0 refills | Status: DC

## 2020-05-03 MED ORDER — DOCUSATE SODIUM 100 MG PO CAPS: 100.0000 mg | ORAL_CAPSULE | Freq: Three times a day (TID) | ORAL | 0 refills | Status: DC

## 2020-05-03 MED ORDER — PREGABALIN 25 MG PO CAPS: 25.0000 mg | ORAL_CAPSULE | Freq: Every day | ORAL | 0 refills | Status: DC

## 2020-05-03 NOTE — Patient Instructions (Signed)
Colace three times per day. Senna two tablets at night Miralax as needed.

## 2020-05-03 NOTE — Progress Notes (Signed)
Subjective:    Patient ID: Sheryl Curtis, female    DOB: [birth year], 86 y.o.   MRN: [id number]  HPI Sheryl Curtis presentsfor follow-up of her fibromyalgia, chronic left shoulder pain,andcervical spondylosis. She has pain in her bilateral shoulders, neck, bilateral hips, knees, ankles, and low back. She has been diagnosed with fibromyalgia, osteoarthritis, and rheumatoid arthritis.Previously I started her on Percocet after she has failed or refused most other medications (Lyrica, Tramadol, Tylenol, NSAIDs, Cymbalta, Amitriptyline, Tylenol with codeine, Skelaxin, Flexeril, Tizanidine), and she has noted improvement in her quality of life. She has been going outside more and has been enjoying her new puppy.She has been taking about 2 Percocets per day. She has noted increasing constipation. She has been taking Miralax as needed. She has a history of hemorrhoids.   She continues to have neck pain especially when turning her neck to the left side. This pain has prevented her from driving and is currently her most severe source of pain. She had cervical facet joint blocks by Dr. Asa Lente at the [facility] without benefit. I have personally reviewed these notes and they were done on the left side at levels C2-C4. She follows with Dr. Lucille Passy her low back pain and has plan for lumbar facet joint radiofrequency ablation after insurance authorization, scheduled in August.   She has been sleeping poorly over the last few days and pain levels have also increased. She lives with her daughter. She has a bedside commode for urination at night.   She would like to lose weight. Current weight is 209 lbs and she would like to get down to 185 lbs. She starts eating breakfast at 1pm and eats dinner at 5:30pm. She like to eat toast with eggs and tomato for breakfast. She likes to eat 6 hershey kisses as a snack but does not snack much beyond this.   Pain Inventory Average Pain 8 Pain Right Now  8 My pain is constant, sharp and aching  In the last 24 hours, has pain interfered with the following? General activity 2 Relation with others 5 Enjoyment of life 2 What TIME of day is your pain at its worst? morning, daytime 7 evening. Sleep (in general) Poor  Pain is worse with: walking, bending, standing and some activites Pain improves with: rest Relief from Meds: 4  Mobility use a cane use a walker how many minutes can you walk? 2 mins ability to climb steps?  no do you drive?  no Do you have any goals in this area?  yes  Function retired I need assistance with the following:  household duties, shopping and Daughter takes care of household & shopping. Do you have any goals in this area?  yes  Neuro/Psych bladder control problems bowel control problems weakness numbness tingling trouble walking spasms dizziness depression anxiety loss of taste or smell  Prior Studies Any changes since last visit?  no  Physicians involved in your care Any changes since last visit?  no   Family History  Problem Relation Age of Onset  . CAD Brother   . Heart attack Brother 56  . Bone cancer Sister   . Throat cancer Daughter    Social History   Socioeconomic History  . Marital status: Widowed    Spouse name: Not on file  . Number of children: 4  . Years of education: Not on file  . Highest education level: Not on file  Occupational History  . Not on file  Tobacco Use  .  Smoking status: Former Smoker    Packs/day: 1.00    Years: 12.00    Pack years: 12.00    Types: Cigarettes    Start date: 10/28/1949    Quit date: 10/28/1962    Years since quitting: 57.5  . Smokeless tobacco: Never Used  Vaping Use  . Vaping Use: Never used  Substance and Sexual Activity  . Alcohol use: No  . Drug use: No  . Sexual activity: Not on file  Other Topics Concern  . Not on file  Social History Narrative  . Not on file   Social Determinants of Health   Financial Resource  Strain:   . Difficulty of Paying Living Expenses:   Food Insecurity: No Food Insecurity  . Worried About Charity fundraiser in the Last Year: Never true  . Ran Out of Food in the Last Year: Never true  Transportation Needs: No Transportation Needs  . Lack of Transportation (Medical): No  . Lack of Transportation (Non-Medical): No  Physical Activity:   . Days of Exercise per Week:   . Minutes of Exercise per Session:   Stress:   . Feeling of Stress :   Social Connections:   . Frequency of Communication with Friends and Family:   . Frequency of Social Gatherings with Friends and Family:   . Attends Religious Services:   . Active Member of Clubs or Organizations:   . Attends Archivist Meetings:   Marland Kitchen Marital Status:    Past Surgical History:  Procedure Laterality Date  . ABDOMINAL HYSTERECTOMY    . ANKLE FRACTURE SURGERY    . BACK SURGERY    . BREAST SURGERY    . COLON SURGERY    . CORONARY STENT PLACEMENT    . PERCUTANEOUS CORONARY STENT INTERVENTION (PCI-S) N/A 05/19/2013   Procedure: PERCUTANEOUS CORONARY STENT INTERVENTION (PCI-S);  Surgeon: Jettie Booze, MD;  Location: [facility] CATH LAB;  Service: Cardiovascular;  Laterality: N/A;   Past Medical History:  Diagnosis Date  . ALLERGIC RHINITIS   . Atrial fibrillation ([place])   . CANCER, COLORECTAL   . CHEST PAIN, ATYPICAL   . CKD (chronic kidney disease)   . DYSPNEA   . Essential hypertension, benign   . FIBROMYALGIA   . G E R D   . History of stress test    Myoview 8/16:  EF 69%, anterior and apical defect c/w breast attenuation; Low Risk   . INSOMNIA   . Morbid obesity ([place])   . Other and unspecified angina pectoris   . Sleep apnea    BP (!) 126/56   Pulse 78   Temp 97.6 F (36.4 C)   Ht 5\' 4"  (1.626 m)   Wt 209 lb (94.8 kg)   SpO2 95%   BMI 35.87 kg/m   Opioid Risk Score:   Fall Risk Score:  `1  Depression screen PHQ 2/9  Depression screen [facility] 2/9 03/07/2020 01/21/2020 10/25/2019 07/22/2019  04/21/2019 01/13/2019 01/04/2019  Decreased Interest 0 - 0 0 0 1 0  Down, Depressed, Hopeless 0 2 0 0 0 1 0  PHQ - 2 Score 0 2 0 0 0 2 0  Altered sleeping - 1 - - - 1 -  Tired, decreased energy - 0 - - - 1 -  Change in appetite - 0 - - - 0 -  Feeling bad or failure about yourself  - 1 - - - 1 -  Trouble concentrating - 1 - - - 1 -  Moving slowly or fidgety/restless - 0 - - - 0 -  Suicidal thoughts - 0 - - - 0 -  PHQ-9 Score - 5 - - - 6 -  Difficult doing work/chores - Somewhat difficult - - - Somewhat difficult -  Some recent data might be hidden   Review of Systems  Constitutional: Positive for appetite change.       Taste is back   HENT: Negative.   Eyes: Negative.   Respiratory: Positive for shortness of breath and wheezing.   Cardiovascular: Positive for chest pain.  Gastrointestinal: Positive for constipation.  Endocrine: Negative.   Genitourinary: Negative for frequency.  Musculoskeletal: Negative.   Skin: Negative.   Allergic/Immunologic: Negative.   Neurological: Positive for numbness.       Tingling & spasms  Hematological: Bruises/bleeds easily.  Psychiatric/Behavioral:       Depression, anxiety       Objective:   Physical Exam Gen: no distress, normal appearing HEENT: oral mucosa pink and moist, NCAT Cardio: Reg rate Chest: normal effort, normal rate of breathing Abd: soft, non-distended Ext: no edema Skin: intact Neuro/Musculoskeletal: Cervical ROM very limited in all planes. Tenderness to palpation over lateral cervical facet joints bilaterally In wheelchair Right sided positive slump test for pain in L5 nerve distribution.  Psych: pleasant, normal affect       Assessment & Plan:  Sheryl Curtis presents with diffuse pains. She has pains in her bilateral shoulders, neck, bilateral hips, knees, ankles, and low back.  1) Fibromyalgia: --previouslydiscontinued Lyrica given side effects of tremor. Discussed starting low dose again for her  neuropathic pain and she is agreeable. Started 25mg  daily. Advised to stop if she develops tremor with this.  --Had negative side effects in response to Skelaxin, Tramadol, and Tylenol with Codeine --PrescribedOxycodone7.5mg -she has been taking approx 2 pills per day as needed for severe pain. Provided with refill. Can take up to twice per day if needed.  --Discussed that out goal will be to provide pain relief so we can increase her mobility, as exercise is a crucial component of fibromyalgia treatment.Her mobility continues to be limited but her walking speed and demeanor are much improved from prior.  2) Chronic left shoulder pain:I have personally reviewed XR; within normal limits.Can consider steroid injection in the future.   3)Lumbar facet arthropathy: She will follow with Dr. Ernestina Patches for radiofrequency ablation pending authorization.Scheduled for August.   4) Recent KDX:[id number] medications. She is on statin and Plavix. BP is much better controlled this visit!  5) Quality of Life: Overall improved. Encourage outdoor mobility and fresh air. Has strong social support of 3 daughters, son, and now new dog which gives her much joy.  6) Opioid induced constipation: Take colace TID, Senna 2 tabs HS, prunes daily.  7) Obesity 209 lbs: Provided dietary counseling.   8) Cervical facet arthritis, worse on left side, now contributing to cervicogenic headaches. Can consider repeat in the future at lower cervical levels.   7) Insomnia: prescribed Amitriptyline 10mg  HS last visit. Advised regarding cardiac side effects and risk of falls but patient prefers to try medication prior to clearance from cardiologist. Advised her daughter to monitor her first night to decrease risk of fall as she sometimes rises to use bedside commode at night.   All questions were encouraged and answered. Follow up with me in4weeks.

## 2020-05-04 ENCOUNTER — Ambulatory Visit: Payer: Medicare Other | Admitting: Orthopaedic Surgery

## 2020-05-04 ENCOUNTER — Encounter: Payer: Self-pay | Admitting: Orthopaedic Surgery

## 2020-05-04 DIAGNOSIS — M542 Cervicalgia: Secondary | ICD-10-CM | POA: Diagnosis not present

## 2020-05-04 DIAGNOSIS — M7061 Trochanteric bursitis, right hip: Secondary | ICD-10-CM

## 2020-05-04 MED ORDER — METHYLPREDNISOLONE ACETATE 40 MG/ML IJ SUSP
40.0000 mg | INTRAMUSCULAR | Status: AC | PRN
  Administered 2020-05-04: 40 mg via INTRA_ARTICULAR

## 2020-05-04 MED ORDER — LIDOCAINE HCL 1 % IJ SOLN
3.0000 mL | INTRAMUSCULAR | Status: AC | PRN
  Administered 2020-05-04: 3 mL

## 2020-05-04 NOTE — Progress Notes (Signed)
Office Visit Note   Patient: Sheryl Curtis           Date of Birth: [birth year]           MRN: [id number] Visit Date: 05/04/2020              Requested by: Alroy Dust, L.Marlou Sa, [place] [employer] [address],  [place] [address] PCP: Alroy Dust, L.Marlou Sa, MD   Assessment & Plan: Visit Diagnoses:  1. Trochanteric bursitis, right hip   2. Cervicalgia     Plan: Per the patient's request I did provide a steroid injection of her right hip trochanteric area and this gave her some relief.  I will see her back in about 3 weeks to go over the MRI of her cervical spine.  All questions and concerns were answered and addressed.  Follow-Up Instructions: Return in about 3 weeks (around 05/25/2020).   Orders:  Orders Placed This Encounter  Procedures  . Large Joint Inj   No orders of the defined types were placed in this encounter.     Procedures: Large Joint Inj: R greater trochanter on 05/04/2020 4:12 PM Indications: pain and diagnostic evaluation Details: 22 G 1.5 in needle, lateral approach  Arthrogram: No  Medications: 3 mL lidocaine 1 %; 40 mg methylPREDNISolone acetate 40 MG/ML Outcome: tolerated well, no immediate complications Procedure, treatment alternatives, risks and benefits explained, specific risks discussed. Consent was given by the patient. Immediately prior to procedure a time out was called to verify the correct patient, procedure, equipment, support staff and site/side marked as required. Patient was prepped and draped in the usual sterile fashion.       Clinical Data: No additional findings.   Subjective: Chief Complaint  Patient presents with  . Right Hip - Pain  The patient is well-known to me.  She is an 86 year old female has multiple pain issues.  She also has fibromyalgia.  She actually is scheduled for MRI of her cervical spine on July 20 of this month.  She also has a lumbar spine intervention by Dr. Ernestina Patches on 18 August.  She comes in today requesting  a steroid injection over her right hip trochanteric area.  She has had a flareup of pain in this area would like to have an injection.  I have provided a trochanteric injection on the right side before with a steroid and that has helped.  She is does ambulate with a cane.  She denies any groin pain.  HPI  Review of Systems She currently denies any headache, chest pain, shortness of breath, fever, chills, nausea, vomiting  Objective: Vital Signs: There were no vitals taken for this visit.  Physical Exam She is alert and orient x3 and in no acute distress Ortho Exam Examination of her right hip shows pain only over the trochanteric area to palpation.  The remainder of her hip exam is normal. Specialty Comments:  No specialty comments available.  Imaging: No results found.   PMFS History: Patient Active Problem List   Diagnosis Date Noted  . Mild persistent asthma without complication 92/08/9416  . Dysphagia 06/14/2019  . Neck pain, chronic 04/21/2019  . Physical deconditioning 12/09/2018  . CKD (chronic kidney disease)   . Morbid obesity ([place])   . Mild persistent asthma with (acute) exacerbation 07/16/2018  . Cough 07/16/2018  . OSA (obstructive sleep apnea) 07/30/2017  . Trochanteric bursitis, right hip 04/21/2017  . Nocturnal hypoxia 03/31/2017  . Sinus bradycardia 08/05/2016  . Hypertensive urgency 08/03/2016  .  Asthma 02/19/2016  . Chronic kidney disease (CKD), stage III (moderate) 07/05/2015  . CAFL (chronic airflow limitation) ([place]) 07/05/2015  . Generalized OA 07/05/2015  . 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 H/O malignant neoplasm of colon 07/05/2015  . Elevated troponin 07/02/2015  . UTI (lower urinary tract infection) 07/01/2015  . Fibromyalgia syndrome 07/01/2015  . Hyponatremia 07/01/2015  . Urinary tract infectious disease   . Pulmonary nodules/lesions, multiple 04/11/2014  . Abdominal pain, generalized 04/07/2014  . Coronary atherosclerosis of native  coronary artery 10/12/2013  . Essential hypertension, benign   . Other and unspecified angina pectoris   . Atrial fibrillation ([place]) 04/19/2010  . Allergic rhinitis 04/19/2010  . DYSPNEA 04/19/2010  . CHEST PAIN, ATYPICAL 04/19/2010  . CANCER, COLORECTAL 04/18/2010  . Essential hypertension 04/18/2010  . G E R D 04/18/2010  . Fibromyalgia 04/18/2010  . INSOMNIA 04/18/2010   Past Medical History:  Diagnosis Date  . ALLERGIC RHINITIS   . Atrial fibrillation ([place])   . CANCER, COLORECTAL   . CHEST PAIN, ATYPICAL   . CKD (chronic kidney disease)   . DYSPNEA   . Essential hypertension, benign   . FIBROMYALGIA   . G E R D   . History of stress test    Myoview 8/16:  EF 69%, anterior and apical defect c/w breast attenuation; Low Risk   . INSOMNIA   . Morbid obesity ([place])   . Other and unspecified angina pectoris   . Sleep apnea     Family History  Problem Relation Age of Onset  . CAD Brother   . Heart attack Brother 24  . Bone cancer Sister   . Throat cancer Daughter     Past Surgical History:  Procedure Laterality Date  . ABDOMINAL HYSTERECTOMY    . ANKLE FRACTURE SURGERY    . BACK SURGERY    . BREAST SURGERY    . COLON SURGERY    . CORONARY STENT PLACEMENT    . PERCUTANEOUS CORONARY STENT INTERVENTION (PCI-S) N/A 05/19/2013   Procedure: PERCUTANEOUS CORONARY STENT INTERVENTION (PCI-S);  Surgeon: Jettie Booze, MD;  Location: [facility] CATH LAB;  Service: Cardiovascular;  Laterality: N/A;   Social History   Occupational History  . Not on file  Tobacco Use  . Smoking status: Former Smoker    Packs/day: 1.00    Years: 12.00    Pack years: 12.00    Types: Cigarettes    Start date: 10/28/1949    Quit date: 10/28/1962    Years since quitting: 57.5  . Smokeless tobacco: Never Used  Vaping Use  . Vaping Use: Never used  Substance and Sexual Activity  . Alcohol use: No  . Drug use: No  . Sexual activity: Not on file

## 2020-05-16 ENCOUNTER — Other Ambulatory Visit: Payer: Self-pay

## 2020-05-16 ENCOUNTER — Ambulatory Visit
Admission: RE | Admit: 2020-05-16 | Discharge: 2020-05-16 | Disposition: A | Discharge status: Home / Self Care | Payer: Medicare Other | Source: Ambulatory Visit | Attending: Orthopaedic Surgery | Admitting: Orthopaedic Surgery

## 2020-05-16 DIAGNOSIS — M542 Cervicalgia: Secondary | ICD-10-CM

## 2020-05-24 ENCOUNTER — Ambulatory Visit: Payer: Medicare Other | Admitting: Orthopaedic Surgery

## 2020-05-24 ENCOUNTER — Encounter: Payer: Self-pay | Admitting: Orthopaedic Surgery

## 2020-05-24 DIAGNOSIS — M4802 Spinal stenosis, cervical region: Secondary | ICD-10-CM

## 2020-05-24 DIAGNOSIS — M542 Cervicalgia: Secondary | ICD-10-CM

## 2020-05-24 NOTE — Progress Notes (Signed)
The patient comes in today to go over an MRI of her cervical spine.  She is 86 years old and very active.  She has a lot of neck pain when she turns just to the left side and not to the right side.  It does radiate into her shoulder and down her arm.  She has had successful intervention in her lumbar spine by Dr. Ernestina Patches in the past.  She is requesting at least an intervention in her neck if that is possible by Dr. Ernestina Patches.  On exam she does have a positive Spurling sign only to the left side not the right side.  She is more stiff with lateral rotation and lateral bending to the left side and not the right side.  There is not any significant weakness in her upper extremity on the left and right side on my exam today.  There is some subjective numbness in the hand.  The MRI is reviewed with her.  There is significant stenosis at C5-C6 but more significant at C6-C7 especially to the left where there is a disc osteophyte complex causing moderate to severe left foraminal stenosis.  I do feel it is worthwhile sending her to Dr. Ernestina Patches for an epidural steroid injection to the left side at C6-C7.  We will work on getting this scheduled.  Follow-up with me can otherwise be as needed.  All questions and concerns were answered and addressed.  She is planning to go to the beach next week to [facility] so this injection can be set up for sometime in the next few weeks.

## 2020-05-25 NOTE — Addendum Note (Signed)
Addended byLaurann Montana on: 05/25/2020 04:19 PM   Modules accepted: Orders

## 2020-06-01 ENCOUNTER — Telehealth: Payer: Self-pay | Admitting: Physical Medicine and Rehabilitation

## 2020-06-01 NOTE — Telephone Encounter (Signed)
Patient needs to schedule an interlaminar epidural steroid injection with Dr. Ernestina Patches at [facility]. She will need to hold Plavix for 7 days prior. Is this ok?

## 2020-06-01 NOTE — Telephone Encounter (Signed)
Request for surgical clearance:   What type of surgery is being performed?  no surgery- epidural steroid injection   When is this surgery scheduled?  not yet scheduled- awaiting clearance   What type of clearance is required (medical clearance vs. Pharmacy clearance to hold med vs. Both)? medical   Are there any medications that need to be held prior to surgery and how long? Plavix- 7 days   Practice name and name of physician performing surgery?  [facility]- Dr. Laurence Spates   What is the office phone number? [phone]   7.   What is the office fax number? [phone]   8.   Anesthesia type (None, local, MAC, general) ? local

## 2020-06-01 NOTE — Telephone Encounter (Signed)
Will route back to [facility], RT, the requesting provider, to send over a formal clearance request for pt and procedure.

## 2020-06-01 NOTE — Telephone Encounter (Signed)
   [facility] Pre-operative Risk Assessment    HEARTCARE STAFF: - Please ensure there is not already an duplicate clearance open for this procedure. - Under Visit Info/Reason for Call, type in Other and utilize the format Clearance MM/DD/YY or Clearance TBD. Do not use dashes or single digits. - If request is for dental extraction, please clarify the # of teeth to be extracted.  Request for surgical clearance:  1. What type of surgery is being performed?    2. When is this surgery scheduled?    3. What type of clearance is required (medical clearance vs. Pharmacy clearance to hold med vs. Both)?   4. Are there any medications that need to be held prior to surgery and how long?   5. Practice name and name of physician performing surgery?    6. What is the office phone number?    7.   What is the office fax number?   8.   Anesthesia type (None, local, MAC, general) ?    Jeanann Lewandowsky 06/01/2020, 9:44 AM  _________________________________________________________________   (provider comments below)

## 2020-06-01 NOTE — Telephone Encounter (Signed)
Is this a specific form your office has? Where can I find it?

## 2020-06-06 ENCOUNTER — Other Ambulatory Visit: Payer: Self-pay | Admitting: Physical Medicine and Rehabilitation

## 2020-06-06 MED ORDER — OXYCODONE-ACETAMINOPHEN 7.5-325 MG PO TABS: 1.0000 | ORAL_TABLET | Freq: Three times a day (TID) | ORAL | 0 refills | Status: DC | PRN

## 2020-06-06 NOTE — Telephone Encounter (Signed)
Pt was cleared to hold Plavix for 7 days for prior injection- will clear her again for this once we speak to her. Message left for the patient to call back yesterday.   Kerin Ransom PA-C 06/06/2020 8:50 AM

## 2020-06-09 ENCOUNTER — Other Ambulatory Visit: Payer: Self-pay | Admitting: Physical Medicine and Rehabilitation

## 2020-06-09 ENCOUNTER — Telehealth: Payer: Self-pay | Admitting: *Deleted

## 2020-06-09 MED ORDER — OXYCODONE-ACETAMINOPHEN 7.5-325 MG PO TABS: 1.0000 | ORAL_TABLET | Freq: Three times a day (TID) | ORAL | 0 refills | Status: DC | PRN

## 2020-06-09 NOTE — Telephone Encounter (Signed)
The medication that was sent to [facility] (Oxycodone acetaminophen 7.5/325) by Dr Ranell Patrick earlier this week needs to be sent to the [facility] on [address].  The one on [address] has system outtage since Tuesday.

## 2020-06-09 NOTE — Telephone Encounter (Signed)
Notified pt that it was sent in by Dr Dagoberto Ligas.

## 2020-06-13 ENCOUNTER — Telehealth: Payer: Self-pay | Admitting: Physical Medicine and Rehabilitation

## 2020-06-13 NOTE — Telephone Encounter (Signed)
Note faxed to surgeon.  Note removed from preop pool. Richardson Dopp, PA-C    06/13/2020 9:19 AM

## 2020-06-13 NOTE — Telephone Encounter (Signed)
Called patient's daughter to advise that patient does not have to hold Plavix for her RFA tomorrow but that we are awaiting the ok for her to hold it for a cervical ESI.

## 2020-06-13 NOTE — Telephone Encounter (Signed)
Received call from patient's daughter [name]  She advised patient did not stop taking (Plavix) and can not get the injection tomorrow. [name] asked for a call back to reschedule as soon as possible. [name] said the cardiologist did not tell her to stop the medication. The number to contact [name] is [phone]

## 2020-06-13 NOTE — Telephone Encounter (Signed)
   Primary Cardiologist: Larae Grooms, MD  Chart reviewed as part of pre-operative protocol coverage. Patient was contacted today.  I spoke with her, [name] (ok per DPR).  The patient has not had any unstable symptoms since last visit.  She is not having chest pain, syncope.  She has chronic shortness of breath without change.  RECOMMENDATIONS: Given past medical history and time since last visit, based on ACC/AHA guidelines, Sheryl Curtis would be at acceptable risk for the planned procedure without further cardiovascular testing.   She can hold her Clopidogrel (Plavix) for 7 days prior to her injection and resume after the procedure once it is felt to be safe.   Please call with questions.  Richardson Dopp, PA-C 06/13/2020, 9:13 AM

## 2020-06-14 ENCOUNTER — Other Ambulatory Visit: Payer: Self-pay

## 2020-06-14 ENCOUNTER — Ambulatory Visit: Payer: Medicare Other | Admitting: Physical Medicine and Rehabilitation

## 2020-06-14 ENCOUNTER — Encounter: Payer: Self-pay | Admitting: Physical Medicine and Rehabilitation

## 2020-06-14 ENCOUNTER — Ambulatory Visit: Payer: Self-pay

## 2020-06-14 VITALS — BP 112/53 | HR 72

## 2020-06-14 DIAGNOSIS — M47816 Spondylosis without myelopathy or radiculopathy, lumbar region: Secondary | ICD-10-CM | POA: Diagnosis not present

## 2020-06-14 MED ORDER — METHYLPREDNISOLONE ACETATE 80 MG/ML IJ SUSP
80.0000 mg | Freq: Once | INTRAMUSCULAR | Status: AC
  Administered 2020-06-14: 80 mg

## 2020-06-14 NOTE — Progress Notes (Signed)
Pain across lower back  Numeric Pain Rating Scale and Functional Assessment Average Pain 10   In the last MONTH (on 0-10 scale) has pain interfered with the following?  1. General activity like being  able to carry out your everyday physical activities such as walking, climbing stairs, carrying groceries, or moving a chair?  Rating(10)   +Driver, +BT, -Dye Allergies.

## 2020-06-21 ENCOUNTER — Encounter: Payer: Medicare Other | Admitting: Physical Medicine and Rehabilitation

## 2020-06-22 NOTE — Procedures (Signed)
Lumbar Facet Joint Nerve Denervation  Patient: Sheryl Curtis      Date of Birth: 01-May-1932 MRN: [id number] PCP: Alroy Dust, L.Marlou Sa, MD      Visit Date: 06/14/2020   Universal Protocol:    Date/Time: 08/26/215:20 AM  Consent Given By: the patient  Position: PRONE  Additional Comments: Vital signs were monitored before and after the procedure. Patient was prepped and draped in the usual sterile fashion. The correct patient, procedure, and site was verified.   Injection Procedure Details:  Procedure Site One Meds Administered:  Meds ordered this encounter  Medications  . methylPREDNISolone acetate (DEPO-MEDROL) injection 80 mg     Laterality: Bilateral  Location/Site:  L4-L5  Needle size: 18 G  Needle type: Radiofrequency cannula  Needle Placement: Along juncture of superior articular process and transverse pocess  Findings:  -Comments:  Procedure Details: For each desired target nerve, the corresponding transverse process (sacral ala for the L5 dorsal rami) was identified and the fluoroscope was positioned to square off the endplates of the corresponding vertebral body to achieve a true AP midline view.  The beam was then obliqued 15 to 20 degrees and caudally tilted 15 to 20 degrees to line up a trajectory along the target nerves. The skin over the target of the junction of superior articulating process and transverse process (sacral ala for the L5 dorsal rami) was infiltrated with 62ml of 1% Lidocaine without Epinephrine.  The 18 gauge 51mm active tip outer cannula was advanced in trajectory view to the target.  This procedure was repeated for each target nerve.  Then, for all levels, the outer cannula placement was fine-tuned and the position was then confirmed with bi-planar imaging.    Test stimulation was done both at sensory and motor levels to ensure there was no radicular stimulation. The target tissues were then infiltrated with 1 ml of 1% Lidocaine without  Epinephrine. Subsequently, a percutaneous neurotomy was carried out for 90 seconds at 80 degrees Celsius.  After the completion of the lesion, 1 ml of injectate was delivered. It was then repeated for each facet joint nerve mentioned above. Appropriate radiographs were obtained to verify the probe placement during the neurotomy.   Additional Comments:  The patient tolerated the procedure well Dressing: 2 x 2 sterile gauze and Band-Aid    Post-procedure details: Patient was observed during the procedure. Post-procedure instructions were reviewed.  Patient left the clinic in stable condition.

## 2020-06-22 NOTE — Progress Notes (Signed)
Sheryl Curtis - 86 y.o. female MRN [id number]  Date of birth: 1932/07/29  Office Visit Note: Visit Date: 06/14/2020 PCP: Alroy Dust, L.Marlou Sa, MD Referred by: Alroy Dust, L.Marlou Sa, MD  Subjective: Chief Complaint  Patient presents with  . Lower Back - Pain   HPI: Sheryl Curtis is a 86 y.o. female who comes in today for planned radiofrequency ablation of the Bilateral L4-L5 Lumbar facet joints. This would be ablation of the corresponding medial branches and/or dorsal rami.  Patient has had double diagnostic blocks with more than 50% relief.  These are documented on pain diary.  They have had chronic back pain for quite some time, more than 3 months, which has been an ongoing situation with recalcitrant axial back pain.  They have no radicular pain.  Their axial pain is worse with standing and ambulating and on exam today with facet loading.  They have had physical therapy as well as home exercise program.  The imaging noted in the chart below indicated facet pathology. Accordingly they meet all the criteria and qualification for for radiofrequency ablation and we are going to complete this today hopefully for more longer term relief as part of comprehensive management program.   ROS Otherwise per HPI.  Assessment & Plan: Visit Diagnoses:  1. Spondylosis without myelopathy or radiculopathy, lumbar region     Plan: No additional findings.   Meds & Orders:  Meds ordered this encounter  Medications  . methylPREDNISolone acetate (DEPO-MEDROL) injection 80 mg    Orders Placed This Encounter  Procedures  . Radiofrequency,Lumbar  . XR C-ARM NO REPORT    Follow-up: Return if symptoms worsen or fail to improve.   Procedures: No procedures performed  Lumbar Facet Joint Nerve Denervation  Patient: Sheryl Curtis      Date of Birth: [birth year] MRN: [id number] PCP: Alroy Dust, L.Marlou Sa, MD      Visit Date: 06/14/2020   Universal Protocol:    Date/Time: 08/26/215:20 AM  Consent Given By: the  patient  Position: PRONE  Additional Comments: Vital signs were monitored before and after the procedure. Patient was prepped and draped in the usual sterile fashion. The correct patient, procedure, and site was verified.   Injection Procedure Details:  Procedure Site One Meds Administered:  Meds ordered this encounter  Medications  . methylPREDNISolone acetate (DEPO-MEDROL) injection 80 mg     Laterality: Bilateral  Location/Site:  L4-L5  Needle size: 18 G  Needle type: Radiofrequency cannula  Needle Placement: Along juncture of superior articular process and transverse pocess  Findings:  -Comments:  Procedure Details: For each desired target nerve, the corresponding transverse process (sacral ala for the L5 dorsal rami) was identified and the fluoroscope was positioned to square off the endplates of the corresponding vertebral body to achieve a true AP midline view.  The beam was then obliqued 15 to 20 degrees and caudally tilted 15 to 20 degrees to line up a trajectory along the target nerves. The skin over the target of the junction of superior articulating process and transverse process (sacral ala for the L5 dorsal rami) was infiltrated with 76ml of 1% Lidocaine without Epinephrine.  The 18 gauge 18mm active tip outer cannula was advanced in trajectory view to the target.  This procedure was repeated for each target nerve.  Then, for all levels, the outer cannula placement was fine-tuned and the position was then confirmed with bi-planar imaging.    Test stimulation was done both at sensory and motor levels to ensure there was  no radicular stimulation. The target tissues were then infiltrated with 1 ml of 1% Lidocaine without Epinephrine. Subsequently, a percutaneous neurotomy was carried out for 90 seconds at 80 degrees Celsius.  After the completion of the lesion, 1 ml of injectate was delivered. It was then repeated for each facet joint nerve mentioned above. Appropriate  radiographs were obtained to verify the probe placement during the neurotomy.   Additional Comments:  The patient tolerated the procedure well Dressing: 2 x 2 sterile gauze and Band-Aid    Post-procedure details: Patient was observed during the procedure. Post-procedure instructions were reviewed.  Patient left the clinic in stable condition.       Clinical History: MRI CERVICAL SPINE WITHOUT CONTRAST  TECHNIQUE: Multiplanar, multisequence MR imaging of the cervical spine was performed. No intravenous contrast was administered.  COMPARISON:  04/19/2020 cervical spine radiographs.  FINDINGS: Please note that motion artifact limits evaluation.  Alignment: Mild reversal of cervical lordosis. Minimal grade 1 C2-3 anterolisthesis. Stepwise grade 1 C5-6 and C6-7 retrolisthesis.  Vertebrae: Normal bone marrow signal intensity. No focal osseous lesion.  Cord: Normal signal and morphology.  Posterior Fossa, vertebral arteries: Negative.  Disc levels: Multilevel desiccation. Mild C3-4 and moderate C5-7 disc space loss.  C2-3: Small central protrusion with uncovertebral and bilateral facet hypertrophy. Mild left neural foraminal narrowing.  C3-4: Disc osteophyte complex with superimposed central protrusion abutting the ventral cord, uncovertebral and bilateral facet hypertrophy. Mild spinal canal and bilateral neural foraminal narrowing.  C4-5: Disc osteophyte complex with superimposed left paracentral protrusion abutting the ventral cord, left predominant uncovertebral and facet hypertrophy. Mild spinal canal and mild left greater than right neural foraminal narrowing.  C5-6: Disc osteophyte complex abutting the ventral cord with superimposed left subarticular/foraminal protrusion, uncovertebral and bilateral facet hypertrophy. Mild to moderate spinal canal and moderate bilateral neural foraminal narrowing.  C6-7: Disc osteophyte complex abutting the  ventral cord with small superimposed left foraminal protrusion, uncovertebral and bilateral facet hypertrophy. Moderate spinal canal and moderate to severe bilateral neural foraminal narrowing.  C7-T1: Tiny central protrusion. No significant spinal canal or neural foraminal narrowing.  Paraspinal tissues: Within normal limits.  IMPRESSION: Moderate to severe bilateral C5-6 and C6-7 neural foraminal narrowing.  Mild to moderate C5-6 and moderate C6-7 spinal canal narrowing.  Mild left C2-3 and bilateral C3-5 neural foraminal narrowing.   Electronically Signed   By: Primitivo Gauze M.D.   On: 05/18/2020 08:30 ---- MRI LUMBAR SPINE WITHOUT CONTRAST    COMPARISON: MRI lumbar spine July 01, 2015    FINDINGS:     VERTEBRAE:Vertebral bodies are intact. Similar severe L3-4, L4-5 and  L5-S1 disc height loss, associated with levoscoliosis. Severe T11-12  and T12-L1 disc height loss associated with dextroscoliosis.  Decreased T2 signal within all disc compatible with desiccation with  multilevel vacuum disc. Moderate lower lumbar subacute on chronic  discogenic endplate changes. No bone marrow signal abnormality to  suggest acute osseous process.     PARASPINAL AND SOFT TISSUES: Included prevertebral and paraspinal  soft tissues are nonacute. Moderate to severe symmetric paraspinal  muscle atrophy. Stable renal cysts measuring up to 15 mm on the  RIGHT.    DISC LEVELS:     L3-4: Small broad-based disc bulge, moderate RIGHT subarticular disc  protrusion. Mild facet arthropathy and ligamentum flavum redundancy.  No canal stenosis though partial chronic effacement RIGHT lateral  recess could affect the traversing RIGHT L4 nerve. Moderate to  severe RIGHT, mild LEFT neural foraminal narrowing.    L4-5:  Stable moderate broad-based disc bulge asymmetric to the  RIGHT. Moderate RIGHT and mild LEFT facet arthropathy with  ligamentum flavum redundancy. No  canal stenosis. Severe RIGHT,  moderate LEFT neural foraminal narrowing.    L5-S1: Moderate broad-based disc bulge. Mild to moderate facet  arthropathy and ligamentum flavum redundancy without canal stenosis.  Moderate to severe RIGHT, severe LEFT neural foraminal narrowing.    IMPRESSION:    No canal stenosis. Neural foraminal narrowing at all lumbar levels:  Severe on the RIGHT at L4-5 and severe on the LEFT at L5-S1.    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   By: Elon Alas M.D.  On: 08/22/2016 03:14   She reports that she quit smoking about 57 years ago. Her smoking use included cigarettes. She started smoking about 70 years ago. She has a 12.00 pack-year smoking history. She has never used smokeless tobacco. No results for input(s): HGBA1C, LABURIC in the last 8760 hours.  Objective:  VS:  HT:    WT:   BMI:     BP:(!) 112/53  HR:72bpm  TEMP: ( )  RESP:  Physical Exam Constitutional:      General: She is not in acute distress.    Appearance: Normal appearance. She is obese. She is not ill-appearing.  HENT:     Head: Normocephalic and atraumatic.     Right Ear: External ear normal.     Left Ear: External ear normal.  Eyes:     Extraocular Movements: Extraocular movements intact.  Cardiovascular:     Rate and Rhythm: Normal rate.     Pulses: Normal pulses.  Musculoskeletal:     Right lower leg: No edema.     Left lower leg: No edema.     Comments: Patient has good distal strength with no pain over the greater trochanters.  No clonus or focal weakness. Patient somewhat slow to rise from a seated position to full extension.  There is concordant low back pain with facet loading and lumbar spine extension rotation.  There are no definitive trigger points but the patient is somewhat tender across the lower back and PSIS.  There is no pain with hip rotation.   Skin:    Findings: No erythema, lesion or rash.  Neurological:      General: No focal deficit present.     Mental Status: She is alert and oriented to person, place, and time.     Sensory: No sensory deficit.     Motor: No weakness or abnormal muscle tone.     Coordination: Coordination normal.  Psychiatric:        Mood and Affect: Mood normal.        Behavior: Behavior normal.     Ortho Exam  Imaging: No results found.  Past Medical/Family/Surgical/Social History: Medications & Allergies reviewed per EMR, new medications updated. Patient Active Problem List   Diagnosis Date Noted  . Mild persistent asthma without complication 76/19/5093  . Dysphagia 06/14/2019  . Neck pain, chronic 04/21/2019  . Physical deconditioning 12/09/2018  . CKD (chronic kidney disease)   . Morbid obesity ([place])   . Mild persistent asthma with (acute) exacerbation 07/16/2018  . Cough 07/16/2018  . OSA (obstructive sleep apnea) 07/30/2017  . Trochanteric bursitis, right hip 04/21/2017  . Nocturnal hypoxia 03/31/2017  . Sinus bradycardia 08/05/2016  . Hypertensive urgency 08/03/2016  . Asthma 02/19/2016  . Chronic kidney disease (CKD), stage III (moderate) 07/05/2015  . CAFL (chronic airflow limitation) (HCC)  07/05/2015  . Generalized OA 07/05/2015  . 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 H/O malignant neoplasm of colon 07/05/2015  . Elevated troponin 07/02/2015  . UTI (lower urinary tract infection) 07/01/2015  . Fibromyalgia syndrome 07/01/2015  . Hyponatremia 07/01/2015  . Urinary tract infectious disease   . Pulmonary nodules/lesions, multiple 04/11/2014  . Abdominal pain, generalized 04/07/2014  . Coronary atherosclerosis of native coronary artery 10/12/2013  . Essential hypertension, benign   . Other and unspecified angina pectoris   . Atrial fibrillation ([place]) 04/19/2010  . Allergic rhinitis 04/19/2010  . DYSPNEA 04/19/2010  . CHEST PAIN, ATYPICAL 04/19/2010  . CANCER, COLORECTAL 04/18/2010  . Essential hypertension 04/18/2010  . G E R D  04/18/2010  . Fibromyalgia 04/18/2010  . INSOMNIA 04/18/2010   Past Medical History:  Diagnosis Date  . ALLERGIC RHINITIS   . Atrial fibrillation ([place])   . CANCER, COLORECTAL   . CHEST PAIN, ATYPICAL   . CKD (chronic kidney disease)   . DYSPNEA   . Essential hypertension, benign   . FIBROMYALGIA   . G E R D   . History of stress test    Myoview 8/16:  EF 69%, anterior and apical defect c/w breast attenuation; Low Risk   . INSOMNIA   . Morbid obesity ([place])   . Other and unspecified angina pectoris   . Sleep apnea    Family History  Problem Relation Age of Onset  . CAD Brother   . Heart attack Brother 64  . Bone cancer Sister   . Throat cancer Daughter    Past Surgical History:  Procedure Laterality Date  . ABDOMINAL HYSTERECTOMY    . ANKLE FRACTURE SURGERY    . BACK SURGERY    . BREAST SURGERY    . COLON SURGERY    . CORONARY STENT PLACEMENT    . PERCUTANEOUS CORONARY STENT INTERVENTION (PCI-S) N/A 05/19/2013   Procedure: PERCUTANEOUS CORONARY STENT INTERVENTION (PCI-S);  Surgeon: Jettie Booze, MD;  Location: [facility] CATH LAB;  Service: Cardiovascular;  Laterality: N/A;   Social History   Occupational History  . Not on file  Tobacco Use  . Smoking status: Former Smoker    Packs/day: 1.00    Years: 12.00    Pack years: 12.00    Types: Cigarettes    Start date: 10/28/1949    Quit date: 10/28/1962    Years since quitting: 57.6  . Smokeless tobacco: Never Used  Vaping Use  . Vaping Use: Never used  Substance and Sexual Activity  . Alcohol use: No  . Drug use: No  . Sexual activity: Not on file

## 2020-06-28 ENCOUNTER — Encounter: Payer: Medicare Other | Admitting: Physical Medicine and Rehabilitation

## 2020-07-18 ENCOUNTER — Other Ambulatory Visit: Payer: Self-pay | Admitting: *Deleted

## 2020-07-18 ENCOUNTER — Encounter: Payer: Self-pay | Admitting: *Deleted

## 2020-07-18 NOTE — Patient Outreach (Signed)
[facility]) Care Management  [place]  07/18/2020   Sheryl Curtis 01-31-1932 [id number]  RN Health Coach telephone call to patient.  [employer] compliance verified. Patient stated that she had a recent fall. Patient fell off the bed. Per patient she is having some rib discomfort.. Patient stated she did go and get checked out at the Dr office. Patient blood pressure is more controlled. Per patient she is having some skin irritation around buttocks from sweating. RN discussed a treatment plan to help decrease irritation. Patient stated that she is refusing to get the COVID vaccine. Patient stated that she does not get the flu vaccine due to having reactions. Patient has received her [facility]  lifeline monitor. Patient does wear compression hose to help with circulation and decrease swelling. Patient and daughter have agreed to follow up outreach calls.  Encounter Medications:  Outpatient Encounter Medications as of 07/18/2020  Medication Sig Note  . acetaminophen (TYLENOL) 650 MG CR tablet Take 650 mg by mouth every 8 (eight) hours as needed for pain.    Marland Kitchen albuterol (PROVENTIL) (2.5 MG/3ML) 0.083% nebulizer solution USE 1 VIAL VIA NEBULIZER EVERY 6 HOURS AS NEEDED (Patient taking differently: Take 2.5 mg by nebulization in the morning and at bedtime. ) 01/27/2020: Patient states she takes everyday   . Albuterol Sulfate (PROAIR RESPICLICK) 409 (90 Base) MCG/ACT AEPB Inhale 1-2 puffs into the lungs every 6 (six) hours as needed (for shortness of breath and/or wheezing.). (Patient taking differently: Inhale 1 puff into the lungs in the morning and at bedtime. )   . ALPRAZolam (XANAX) 0.25 MG tablet Take 0.25 mg by mouth 3 (three) times daily as needed for anxiety.   Marland Kitchen amitriptyline (ELAVIL) 10 MG tablet Take 1 tablet (10 mg total) by mouth at bedtime.   . Ascorbic Acid (VITAMIN C) 1000 MG tablet Take 1,000 mg by mouth daily.   . budesonide-formoterol (SYMBICORT) 160-4.5 MCG/ACT inhaler  Inhale 2 puffs into the lungs 2 (two) times daily.   . carbamide peroxide (DEBROX) 6.5 % OTIC solution Place 5 drops into both ears 2 (two) times daily.   . Cholecalciferol (VITAMIN D) 50 MCG (2000 UT) tablet Take 2,000 Units by mouth daily.   . clopidogrel (PLAVIX) 75 MG tablet TAKE 1 TABLET(75 MG) BY MOUTH EVERY OTHER DAY (Patient taking differently: Take 75 mg by mouth daily. )   . diphenhydrAMINE (BENADRYL ALLERGY) 25 mg capsule Take 1 capsule (25 mg total) by mouth at bedtime as needed.   . docusate sodium (COLACE) 100 MG capsule Take 1 capsule (100 mg total) by mouth in the morning, at noon, and at bedtime.   . DULoxetine (CYMBALTA) 60 MG capsule Take 60 mg by mouth daily.   . furosemide (LASIX) 40 MG tablet TAKE 1 TABLET BY MOUTH  DAILY   . gabapentin (NEURONTIN) 300 MG capsule Take 1 capsule (300 mg total) by mouth at bedtime.   Marland Kitchen ipratropium (ATROVENT) 0.03 % nasal spray Place 2 sprays into both nostrils every 12 (twelve) hours.   . isosorbide mononitrate (IMDUR) 60 MG 24 hr tablet TAKE 1 TABLET BY MOUTH  DAILY   . Nebulizers (COMPRESSOR NEBULIZER) MISC 1 Device by Does not apply route once.   . nitroGLYCERIN (NITROSTAT) 0.4 MG SL tablet Place 1 tablet (0.4 mg total) under the tongue every 5 (five) minutes as needed for chest pain.   . Omega-3 Fatty Acids (FISH OIL) 1200 MG CAPS Take 1,200 mg by mouth daily.   Marland Kitchen  oxyCODONE-acetaminophen (PERCOCET) 7.5-325 MG tablet Take 1 tablet by mouth 3 (three) times daily as needed for severe pain.   . polyethylene glycol (MIRALAX / GLYCOLAX) 17 g packet Take 17 g by mouth daily as needed for mild constipation.    . potassium chloride SA (KLOR-CON) 20 MEQ tablet TAKE 1 TABLET BY MOUTH  DAILY   . pregabalin (LYRICA) 25 MG capsule Take 1 capsule (25 mg total) by mouth daily.   Marland Kitchen Respiratory Therapy Supplies (FLUTTER) DEVI Use flutter device 3 times a day   . rosuvastatin (CRESTOR) 20 MG tablet Take 1 tablet (20 mg total) by mouth daily.   Marland Kitchen senna  (SENOKOT) 8.6 MG TABS tablet Take 2 tablets (17.2 mg total) by mouth at bedtime.   . Tiotropium Bromide-Olodaterol (STIOLTO RESPIMAT) 2.5-2.5 MCG/ACT AERS Inhale 2 puffs into the lungs daily.   . Tiotropium Bromide-Olodaterol (STIOLTO RESPIMAT) 2.5-2.5 MCG/ACT AERS Inhale 2 puffs into the lungs daily.   . valsartan-hydrochlorothiazide (DIOVAN-HCT) 160-12.5 MG tablet Take 1 tablet by mouth daily.    No facility-administered encounter medications on file as of 07/18/2020.    Functional Status:  In your present state of health, do you have any difficulty performing the following activities: 10/25/2019 07/22/2019  Hearing? [name]  Vision? N -  Difficulty concentrating or making decisions? N N  Walking or climbing stairs? Y Y  Comment due to arthritis and pain arthritis and back painn radiating down leg  Dressing or bathing? N N  Doing errands, shopping? [name]  Comment daughter [name] runs all errands daughter [name] assists  Conservation officer, nature and eating ? N N  Using the Toilet? N N  In the past six months, have you accidently leaked urine? Y Y  Do you have problems with loss of bowel control? N N  Managing your Medications? N N  Managing your Finances? N N  Housekeeping or managing your Housekeeping? [name] helps out daughters help out  Some recent data might be hidden    Fall/Depression Screening: Fall Risk  07/18/2020 05/03/2020 04/26/2020  Falls in the past year? '1 1 1  ' Comment - NONE IN THE LAST 30 DAYS - TODAY IS 05/03/2020 -  Number falls in past yr: '1 1 1  ' Injury with Fall? 1 0 1  Comment - - -  Risk Factor Category  - - -  Risk for fall due to : History of fall(s);Impaired balance/gait;Impaired mobility - History of fall(s);Impaired balance/gait;Impaired mobility  Risk for fall due to: Comment - - -  Follow up Falls evaluation completed;Falls prevention discussed - Falls evaluation completed;Falls prevention discussed  Comment - - -   PHQ 2/9 Scores 05/03/2020 03/07/2020  01/21/2020 10/25/2019 07/22/2019 04/21/2019 01/13/2019  PHQ - 2 Score 0 0 2 0 0 0 2  PHQ- 9 Score - - 5 - - - 6  Exception Documentation - - - - - - Patient refusal   Goals Addressed            This Visit's Progress   . Blood Pressure < 140/90       CARE PLAN ENTRY (see longitudinal plan of care for additional care plan information)  Objective:  . Last practice recorded BP readings:  BP Readings from Last 3 Encounters:  06/14/20 (!) 112/53  05/03/20 (!) 126/56  04/05/20 (!) 94/55 .   Marland Kitchen Most recent eGFR/CrCl: No results found for: EGFR  No components found for: CRCL  Current Barriers:  Marland Kitchen Knowledge Deficits related to  basic understanding of hypertension diagnosis . Knowledge deficit related to self care management of hypertension . Knowledge deficit related to dangers of uncontrolled hypertension  Case Manager Clinical Goal(s):  Marland Kitchen Over the next 90 days, patient will verbalize understanding of plan for hypertension management . Over the next 90 days, patient will attend all scheduled medical appointments: PCP,eye exam . Over the next 90 days, patient will demonstrate improved adherence to prescribed treatment plan for hypertension as evidenced by taking all medications as prescribed, monitoring and recording blood pressure as directed, adhering to low sodium/DASH diet . Over the next 90 days, patient will demonstrate improved health management independence as evidenced by checking blood pressure as directed and notifying PCP if SBP>190 or DBP > 110, taking all medications as prescribe, and adhering to a low sodium diet as discussed. . Over the next 90 days, patient will verbalize basic understanding of hypertension disease process and self health management plan as evidenced by blood pressure 140/90 or less  Interventions:  . Evaluation of current treatment plan related to hypertension self management and patient's adherence to plan as established by provider. . Reviewed medications  with patient and discussed importance of compliance . Discussed plans with patient for ongoing care management follow up and provided patient with direct contact information for care management team . Advised patient, providing education and rationale, to monitor blood pressure daily and record, calling PCP for findings outside established parameters.  . Provided education regarding s/s of stroke and stroke prevention . Provided education regarding s/s of heart attack . Provided education regarding s/s of DASH diet/Low salt diet . Provided education regarding complications of uncontrolled blood pressure . Provided educational material: Matter of Choice Blood Pressure Control . Provided educational material:  EMMI (Weight Loss Tips, Weight Loss Diet, Chair Exercises, Relaxation Techniques, Low Sodium Diet, DASH Diet, Controlling your Blood Pressure through Lifestyle, High Blood Pressure Health Problems, High Blood Pressure Taking Your Blood Pressure, Lowering Your Rick of High Blood Pressure, Stress, etc)  Patient Self Care Activities:  . Attends all scheduled provider appointments . Calls provider office for new concerns, questions, or BP outside discussed parameters . Monitors BP and records as discussed . Adheres to a low sodium diet/DASH diet . Verbalize where to go to receive medical care  Initial goal documentation         Assessment:  Patient had a recent fall Rib discomfort and bruising of arm.  Refuses COVID vaccine Refuses Flu vaccine Blood pressure better controlled Adheres to low sodium diet Patient has received [facility] lifeline medical alert system Plan:  Patient will contact PCP if further discomfort from fall Discussed fall prevention Discussed treatment for skin irritation Discussed COVID and flu vaccine Patient will continue to monitor blood pressure at intervals RN will follow up within the month of December Update assessment sent to PCP   [facility] [phone]

## 2020-07-18 NOTE — Telephone Encounter (Signed)
This encounter was created in error - please disregard.

## 2020-07-20 ENCOUNTER — Encounter: Payer: Self-pay | Admitting: *Deleted

## 2020-07-20 ENCOUNTER — Other Ambulatory Visit: Payer: Self-pay | Admitting: *Deleted

## 2020-07-20 NOTE — Addendum Note (Signed)
Addended by: Verlin Grills on: 07/20/2020 04:31 PM   Modules accepted: Orders

## 2020-07-20 NOTE — Telephone Encounter (Signed)
This encounter was created in error - please disregard.

## 2020-07-20 NOTE — Patient Outreach (Signed)
Referral from Johny Shock, RN to [facility] for Medication Assistance.

## 2020-07-26 ENCOUNTER — Ambulatory Visit: Payer: Medicare Other | Admitting: *Deleted

## 2020-07-27 ENCOUNTER — Other Ambulatory Visit: Payer: Self-pay | Admitting: Physician Assistant

## 2020-07-27 ENCOUNTER — Ambulatory Visit
Admission: RE | Admit: 2020-07-27 | Discharge: 2020-07-27 | Disposition: A | Discharge status: Home / Self Care | Payer: Medicare Other | Source: Ambulatory Visit | Attending: Physician Assistant | Admitting: Physician Assistant

## 2020-07-27 DIAGNOSIS — W19XXXA Unspecified fall, initial encounter: Secondary | ICD-10-CM

## 2020-08-09 ENCOUNTER — Ambulatory Visit: Payer: Self-pay

## 2020-08-09 ENCOUNTER — Encounter: Payer: Self-pay | Admitting: Orthopaedic Surgery

## 2020-08-09 ENCOUNTER — Telehealth: Payer: Self-pay

## 2020-08-09 ENCOUNTER — Ambulatory Visit: Payer: Medicare Other | Admitting: Physician Assistant

## 2020-08-09 ENCOUNTER — Ambulatory Visit: Payer: Medicare Other | Admitting: Orthopaedic Surgery

## 2020-08-09 DIAGNOSIS — M5440 Lumbago with sciatica, unspecified side: Secondary | ICD-10-CM | POA: Diagnosis not present

## 2020-08-09 DIAGNOSIS — M792 Neuralgia and neuritis, unspecified: Secondary | ICD-10-CM

## 2020-08-09 DIAGNOSIS — M4802 Spinal stenosis, cervical region: Secondary | ICD-10-CM | POA: Diagnosis not present

## 2020-08-09 DIAGNOSIS — M7061 Trochanteric bursitis, right hip: Secondary | ICD-10-CM

## 2020-08-09 MED ORDER — LIDOCAINE HCL 1 % IJ SOLN
3.0000 mL | INTRAMUSCULAR | Status: AC | PRN
  Administered 2020-08-09: 3 mL

## 2020-08-09 MED ORDER — METHYLPREDNISOLONE ACETATE 40 MG/ML IJ SUSP
40.0000 mg | INTRAMUSCULAR | Status: AC | PRN
  Administered 2020-08-09: 40 mg via INTRA_ARTICULAR

## 2020-08-09 NOTE — Progress Notes (Signed)
Office Visit Note   Patient: Sheryl Curtis           Date of Birth: 03/18/1932           MRN: [id number] Visit Date: 08/09/2020              Requested by: Alroy Dust, L.Marlou Sa, [place] [address] [employer] [address] [place],   [id number] PCP: Alroy Dust, L.Marlou Sa, MD   Assessment & Plan: Visit Diagnoses:  1. Radicular pain in left arm   2. Low back pain with sciatica, sciatica laterality unspecified, unspecified back pain laterality, unspecified chronicity   3. Spinal stenosis of cervical region   4. Trochanteric bursitis, right hip     Plan: We will send her to physical therapy for her neck back and for IT band stretching.  She needs mainly just a home exercise program.  In regards to her neck we will refer her for an epidural steroid injection on the left at C6-C7.  We will see how her trochanteric injection affects the pain she is having down the right leg.  Questions encouraged and answered both patient and her daughter is present today.  Follow-Up Instructions: Return if symptoms worsen or fail to improve.   Orders:  Orders Placed This Encounter  Procedures  . Large Joint Inj: R greater trochanter  . XR Cervical Spine 2 or 3 views  . XR Lumbar Spine 2-3 Views   No orders of the defined types were placed in this encounter.     Procedures: Large Joint Inj: R greater trochanter on 08/09/2020 10:57 AM Indications: pain Details: 22 G 1.5 in needle, lateral approach  Arthrogram: No  Medications: 3 mL lidocaine 1 %; 40 mg methylPREDNISolone acetate 40 MG/ML Outcome: tolerated well, no immediate complications Procedure, treatment alternatives, risks and benefits explained, specific risks discussed. Consent was given by the patient. Immediately prior to procedure a time out was called to verify the correct patient, procedure, equipment, support staff and site/side marked as required. Patient was prepped and draped in the usual sterile fashion.       Clinical Data: No additional  findings.   Subjective: Chief Complaint  Patient presents with  . Neck - Pain  . Lower Back - Pain    HPI Sheryl Curtis returns today due to low back pain and neck pain.  She has known significant stenosis at C5-C6 and C6-C7.  She was referred to Dr. Ernestina Patches for an epidural steroid injection on the left side at C6-C7 back in July however I am unable to find any type of procedure with was done on her cervical spine.  There may have been some miscommunication as patient was heading to [facility] at that time.  She also states that her neck pain is causing bilateral shoulder pain and some numbness tingling down the left arm.  She did undergo a radiofrequency ablation bilaterally at L4-5 by Dr. Ernestina Patches.  She states that the ablation did help with her pain.  She is having some radicular symptoms though again down her right leg into her foot over the last 4 to 5 days.  She reports no new injury to the back.  She is requesting an injection with cortisone for trochanteric bursitis on the right.  Review of Systems Denies any fevers, chills, change in bowel bladder dysfunction.  No waking pain.  Positive for insomnia.  Objective: Vital Signs: There were no vitals taken for this visit.  Physical Exam Constitutional:      Appearance: She  is not ill-appearing or diaphoretic.  Pulmonary:     Effort: Pulmonary effort is normal.  Neurological:     Mental Status: She is alert and oriented to person, place, and time.  Psychiatric:        Mood and Affect: Mood normal.     Ortho Exam Upper extremities she has 5 out of 5 strengths throughout the upper extremities.  Tenderness over the medial borders of both lateral scapula no tenderness over the cervical spine.  She has pain with rotation of the head to the right causes left neck pain.  Full sensation bilateral hands.  Full motor bilateral hands.  Lower extremities bilateral 5 strengths throughout against resistance negative straight leg raise  bilaterally.  Tenderness over the trochanteric region bilateral hips right greater than left.  Good range of motion of both hips without pain.  She is able to get on and off the exam table on her own today.  Dorsal pedal pulses are 2+ bilaterally sensation grossly intact bilateral feet. Specialty Comments:  No specialty comments available.  Imaging: XR Cervical Spine 2 or 3 views  Result Date: 08/09/2020 Cervical spine: Degenerative changes throughout the C-spine no acute findings.  Significant changes at C3-4 C4 4 5.  XR Lumbar Spine 2-3 Views  Result Date: 08/09/2020 Lumbar spine 2 views: Diffuse degenerative changes throughout lumbar spine with loss of disc space throughout and significant facet arthritic changes.  Loss of lordotic curvature.  Arthrosclerosis of aorta noted.  No acute findings.    PMFS History: Patient Active Problem List   Diagnosis Date Noted  . Mild persistent asthma without complication 37/01/8888  . Bilateral sensorineural hearing loss 09/17/2019  . Dysphagia 06/14/2019  . Neck pain, chronic 04/21/2019  . Physical deconditioning 12/09/2018  . CKD (chronic kidney disease)   . Morbid obesity ([place])   . Mild persistent asthma with (acute) exacerbation 07/16/2018  . Cough 07/16/2018  . OSA (obstructive sleep apnea) 07/30/2017  . Trochanteric bursitis, right hip 04/21/2017  . Nocturnal hypoxia 03/31/2017  . Sinus bradycardia 08/05/2016  . Hypertensive urgency 08/03/2016  . Asthma 02/19/2016  . Chronic kidney disease (CKD), stage III (moderate) ([place]) 07/05/2015  . CAFL (chronic airflow limitation) ([place]) 07/05/2015  . Generalized OA 07/05/2015  . 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 H/O malignant neoplasm of colon 07/05/2015  . Elevated troponin 07/02/2015  . UTI (lower urinary tract infection) 07/01/2015  . Fibromyalgia syndrome 07/01/2015  . Hyponatremia 07/01/2015  . Urinary tract infectious disease   . Pulmonary nodules/lesions, multiple  04/11/2014  . Abdominal pain, generalized 04/07/2014  . Coronary atherosclerosis of native coronary artery 10/12/2013  . Essential hypertension, benign   . Other and unspecified angina pectoris   . Atrial fibrillation ([place]) 04/19/2010  . Allergic rhinitis 04/19/2010  . DYSPNEA 04/19/2010  . CHEST PAIN, ATYPICAL 04/19/2010  . CANCER, COLORECTAL 04/18/2010  . Essential hypertension 04/18/2010  . G E R D 04/18/2010  . Fibromyalgia 04/18/2010  . INSOMNIA 04/18/2010   Past Medical History:  Diagnosis Date  . ALLERGIC RHINITIS   . Atrial fibrillation ([place])   . CANCER, COLORECTAL   . CHEST PAIN, ATYPICAL   . CKD (chronic kidney disease)   . DYSPNEA   . Essential hypertension, benign   . FIBROMYALGIA   . G E R D   . History of stress test    Myoview 8/16:  EF 69%, anterior and apical defect c/w breast attenuation; Low Risk   . INSOMNIA   . Morbid obesity ([place])   .  Other and unspecified angina pectoris   . Sleep apnea     Family History  Problem Relation Age of Onset  . CAD Brother   . Heart attack Brother 36  . Bone cancer Sister   . Throat cancer Daughter     Past Surgical History:  Procedure Laterality Date  . ABDOMINAL HYSTERECTOMY    . ANKLE FRACTURE SURGERY    . BACK SURGERY    . BREAST SURGERY    . COLON SURGERY    . CORONARY STENT PLACEMENT    . PERCUTANEOUS CORONARY STENT INTERVENTION (PCI-S) N/A 05/19/2013   Procedure: PERCUTANEOUS CORONARY STENT INTERVENTION (PCI-S);  Surgeon: Jettie Booze, MD;  Location: [facility] CATH LAB;  Service: Cardiovascular;  Laterality: N/A;   Social History   Occupational History  . Not on file  Tobacco Use  . Smoking status: Former Smoker    Packs/day: 1.00    Years: 12.00    Pack years: 12.00    Types: Cigarettes    Start date: 10/28/1949    Quit date: 10/28/1962    Years since quitting: 57.8  . Smokeless tobacco: Never Used  Vaping Use  . Vaping Use: Never used  Substance and Sexual Activity  . Alcohol use: No  . Drug  use: No  . Sexual activity: Not on file

## 2020-08-09 NOTE — Addendum Note (Signed)
Addended by: Michae Kava B on: 08/09/2020 12:51 PM   Modules accepted: Orders

## 2020-08-09 NOTE — Telephone Encounter (Signed)
OK to hold plavix for 7 days prior  JV

## 2020-08-09 NOTE — Telephone Encounter (Signed)
Pt need to sch interlaminar epidural steroid injection with Dr. Ernestina Patches at [place]. Pt need to hold her Plavix for seven days. Will this be okay?

## 2020-08-10 ENCOUNTER — Telehealth: Payer: Self-pay | Admitting: Physical Medicine and Rehabilitation

## 2020-08-10 NOTE — Telephone Encounter (Signed)
Patient's daughter [name] called needing to make an appointment with Dr Ernestina Patches for an injection for her mother. [name] said Dr Ninfa Linden referred her to Dr Ernestina Patches. The number to contact [name] is [phone]

## 2020-08-10 NOTE — Telephone Encounter (Signed)
Dr. Clayton Bibles says she can hold her BT for seven days, pt has been sch 11/3.

## 2020-08-10 NOTE — Telephone Encounter (Signed)
Called pt back and sch 11/3.

## 2020-08-14 ENCOUNTER — Ambulatory Visit: Payer: Medicare Other | Admitting: Orthopaedic Surgery

## 2020-08-16 ENCOUNTER — Other Ambulatory Visit: Payer: Self-pay

## 2020-08-16 ENCOUNTER — Encounter
Payer: Medicare Other | Attending: Physical Medicine and Rehabilitation | Admitting: Physical Medicine and Rehabilitation

## 2020-08-16 ENCOUNTER — Encounter: Payer: Self-pay | Admitting: Physical Medicine and Rehabilitation

## 2020-08-16 VITALS — BP 139/63 | HR 78 | Temp 98.2°F | Ht 64.0 in | Wt 217.4 lb

## 2020-08-16 DIAGNOSIS — M797 Fibromyalgia: Secondary | ICD-10-CM | POA: Diagnosis present

## 2020-08-16 DIAGNOSIS — M7061 Trochanteric bursitis, right hip: Secondary | ICD-10-CM

## 2020-08-16 DIAGNOSIS — M47812 Spondylosis without myelopathy or radiculopathy, cervical region: Secondary | ICD-10-CM | POA: Diagnosis present

## 2020-08-16 MED ORDER — PREGABALIN 25 MG PO CAPS
25.0000 mg | ORAL_CAPSULE | Freq: Every day | ORAL | 0 refills | Status: DC
Start: 2020-08-16 — End: 2020-10-11

## 2020-08-16 NOTE — Progress Notes (Signed)
Subjective:    Patient ID: Sheryl Curtis, female    DOB: 07-Feb-1932, 86 y.o.   MRN: [id number]   HPI  Sheryl Curtis is an 86 year old woman who presents for follow-up of her fibromyalgia, left shoulder pain, cervical facet arthropathy, and with new right sided greater trochanteric pain syndrome.  Currently her chief complaint is her right sided hip pain. She recently had a steroid injection that gave her no relief. Discussed lidocaine/normal saline trigger point injections and she elected to try these today.  Her pain has been getting unbearable. She stopped getting relief from the oxycodone. Discussed restarting Lyrica which helped her at lower doses. Higher dose caused her to have the shakes and made her go to ED.   Other medications she has failed include: Tramadol, Tylenol, Percocet, NSAIDs, Cymbalta, Amitriptyline, Tylenol with codeine, Skelaxin, Flexeril, and Tizanidine.   She does not take NSAIDs due to her cardiac history.   Prior history: Sheryl Curtis presentsforfor follow-up of her fibromyalgia, chronic left shoulder pain,andcervical spondylosis. She has pain in her bilateral shoulders, neck, bilateral hips, knees, ankles, and Curtis back. She has been diagnosed with fibromyalgia, osteoarthritis, and rheumatoid arthritis.Previously I started her on Percocet after she has failed or refused most other medications (Lyrica, Tramadol, Tylenol, NSAIDs, Cymbalta, Amitriptyline, Tylenol with codeine, Skelaxin, Flexeril, Tizanidine), and she has noted improvement in her quality of life. She has been going outside more and has been enjoying her new puppy.She has been taking about 2 Percocets per day. She has noted increasing constipation. She has been taking Miralax as needed. She has a history of hemorrhoids.   She continues to have neck pain especially when turning her neck to the left side. This pain has prevented her from driving and is currently her most severe source of pain. She had  cervical facet joint blocks by Dr. Asa Lente at the [facility] without benefit. I have personally reviewed these notes and they were done on the left side at levels C2-C4. She follows with Dr. Lucille Passy her Curtis back pain and has plan for lumbar facet joint radiofrequency ablation after insurance authorization, scheduled in August.   She has been sleeping poorly over the last few days and pain levels have also increased. She lives with her daughter. She has a bedside commode for urination at night.   She would like to lose weight. Current weight is 209 lbs and she would like to get down to 185 lbs. She starts eating breakfast at 1pm and eats dinner at 5:30pm. She like to eat toast with eggs and tomato for breakfast. She likes to eat 6 hershey kisses as a snack but does not snack much beyond this.   Pain Inventory Average Pain 9 Pain Right Now 8 My pain is stabbing  In the last 24 hours, has pain interfered with the following? General activity 10 Relation with others 10 Enjoyment of life 10 What TIME of day is your pain at its worst? all Sleep (in general) Fair  Pain is worse with: walking, bending and standing Pain improves with: rest and medication Relief from Meds: 2    Family History  Problem Relation Age of Onset  . CAD Brother   . Heart attack Brother 46  . Bone cancer Sister   . Throat cancer Daughter    Social History   Socioeconomic History  . Marital status: Widowed    Spouse name: Not on file  . Number of children: 4  . Years of  education: Not on file  . Highest education level: Not on file  Occupational History  . Not on file  Tobacco Use  . Smoking status: Former Smoker    Packs/day: 1.00    Years: 12.00    Pack years: 12.00    Types: Cigarettes    Start date: 10/28/1949    Quit date: 10/28/1962    Years since quitting: 57.8  . Smokeless tobacco: Never Used  Vaping Use  . Vaping Use: Never used  Substance and Sexual Activity  . Alcohol use: No   . Drug use: No  . Sexual activity: Not on file  Other Topics Concern  . Not on file  Social History Narrative  . Not on file   Social Determinants of Health   Financial Resource Strain:   . Difficulty of Paying Living Expenses: Not on file  Food Insecurity: No Food Insecurity  . Worried About Charity fundraiser in the Last Year: Never true  . Ran Out of Food in the Last Year: Never true  Transportation Needs: No Transportation Needs  . Lack of Transportation (Medical): No  . Lack of Transportation (Non-Medical): No  Physical Activity:   . Days of Exercise per Week: Not on file  . Minutes of Exercise per Session: Not on file  Stress:   . Feeling of Stress : Not on file  Social Connections:   . Frequency of Communication with Friends and Family: Not on file  . Frequency of Social Gatherings with Friends and Family: Not on file  . Attends Religious Services: Not on file  . Active Member of Clubs or Organizations: Not on file  . Attends Archivist Meetings: Not on file  . Marital Status: Not on file   Past Surgical History:  Procedure Laterality Date  . ABDOMINAL HYSTERECTOMY    . ANKLE FRACTURE SURGERY    . BACK SURGERY    . BREAST SURGERY    . COLON SURGERY    . CORONARY STENT PLACEMENT    . PERCUTANEOUS CORONARY STENT INTERVENTION (PCI-S) N/A 05/19/2013   Procedure: PERCUTANEOUS CORONARY STENT INTERVENTION (PCI-S);  Surgeon: Jettie Booze, MD;  Location: [facility] CATH LAB;  Service: Cardiovascular;  Laterality: N/A;   Past Medical History:  Diagnosis Date  . ALLERGIC RHINITIS   . Atrial fibrillation ([place])   . CANCER, COLORECTAL   . CHEST PAIN, ATYPICAL   . CKD (chronic kidney disease)   . DYSPNEA   . Essential hypertension, benign   . FIBROMYALGIA   . G E R D   . History of stress test    Myoview 8/16:  EF 69%, anterior and apical defect c/w breast attenuation; Curtis Risk   . INSOMNIA   . Morbid obesity ([place])   . Other and unspecified angina pectoris     . Sleep apnea    There were no vitals taken for this visit.  Opioid Risk Score:   Fall Risk Score:  `1  Depression screen PHQ 2/9  Depression screen [facility] 2/9 05/03/2020 03/07/2020 01/21/2020 10/25/2019 07/22/2019 04/21/2019 01/13/2019  Decreased Interest 0 0 - 0 0 0 1  Down, Depressed, Hopeless 0 0 2 0 0 0 1  PHQ - 2 Score 0 0 2 0 0 0 2  Altered sleeping - - 1 - - - 1  Tired, decreased energy - - 0 - - - 1  Change in appetite - - 0 - - - 0  Feeling bad or failure about yourself  - -  1 - - - 1  Trouble concentrating - - 1 - - - 1  Moving slowly or fidgety/restless - - 0 - - - 0  Suicidal thoughts - - 0 - - - 0  PHQ-9 Score - - 5 - - - 6  Difficult doing work/chores - - Somewhat difficult - - - Somewhat difficult  Some recent data might be hidden   Review of Systems  Constitutional: Positive for appetite change.       Taste is back   HENT: Negative.   Eyes: Negative.   Respiratory: Positive for shortness of breath and wheezing.   Cardiovascular: Positive for chest pain.  Gastrointestinal: Positive for constipation.  Endocrine: Negative.   Genitourinary: Negative for frequency.  Musculoskeletal: Negative.   Skin: Negative.   Allergic/Immunologic: Negative.   Neurological: Positive for numbness.       Tingling & spasms  Hematological: Bruises/bleeds easily.  Psychiatric/Behavioral:       Depression, anxiety       Objective:   Physical Exam Gen: no distress, normal appearing HEENT: oral mucosa pink and moist, NCAT Cardio: Reg rate Chest: normal effort, normal rate of breathing Abd: soft, non-distended Ext: no edema Cervical ROM very limited in all planes. Tenderness to palpation over lateral cervical facet joints bilaterally In wheelchair Right sided positive slump test for pain in L5 nerve distribution. Tenderness to palpation over right greater trochanteric bursa Psych: pleasant, normal affect      Assessment & Plan:  Mrs. Nathania Waldman presents with diffuse pains.  She has pains in her bilateral shoulders, neck, bilateral hips, knees, ankles, and Curtis back.  1) Fibromyalgia: -previously discontinued Lyrica as she had developed tremor at high dose. Discussed starting Curtis dose again for her neuropathic pain and she is agreeable. I have sent script for 25mg .   --Had negative side effects in response to Skelaxin, Tramadol, and Tylenol with Codeine, Percocet.   2) Chronic left shoulder pain:I have personally reviewed XR; within normal limits.Can consider steroid injection in the future.   3)Lumbar facet arthropathy: Followed with Dr. Ernestina Patches for radiofrequency ablation in August  4) Recent GHW:[id number] medications. She is on statin and Plavix. BP is much better controlled this visit!  5) Quality of Life: Overall improved. Encourage outdoor mobility and fresh air. Has strong social support of 3 daughters, son, and now new dog which gives her much joy.  7) Obesity 217 lbs: Provided dietary counseling.   8) Cervical facet arthritis, worse on left side, now contributing to cervicogenic headaches. Can consider trigger point injections.   7) Insomnia: prescribed Amitriptyline 10mg  HS previously. Advised regarding cardiac side effects and risk of falls but patient prefers to try medication prior to clearance from cardiologist. Advised her daughter to monitor her first night to decrease risk of fall as she sometimes rises to use bedside commode at night.   8) New right sided greater trochanteric pain syndrome: performed trigger points as follows:  Trigger Point Injection  Indication: Cervical myofascial pain not relieved by medication management and other conservative care.  Informed consent was obtained after describing risk and benefits of the procedure with the patient, this includes bleeding, bruising, infection and medication side effects.  The patient wishes to proceed and has given written consent.  The patient was placed in a seated  position.  The cervical area was marked and prepped with Betadine.  It was entered with a 25-gauge 1/2 inch needle and a total of 3 mL of 1% lidocaine and 64mL of  normal saline was injected into a total of 10  trigger points, after negative draw back for blood.  The patient tolerated the procedure well.  Post procedure instructions were given.  All questions were encouraged and answered. Follow up with me in4weeks for repeat trigger point injections.

## 2020-08-30 ENCOUNTER — Ambulatory Visit: Payer: Medicare Other | Admitting: Physical Medicine and Rehabilitation

## 2020-09-04 ENCOUNTER — Telehealth: Payer: Self-pay | Admitting: Physical Medicine and Rehabilitation

## 2020-09-04 NOTE — Telephone Encounter (Signed)
Patient's daughter called. She would like to reschedule her mom's appointment  with Dr. Ernestina Patches. Her call back number is [phone]

## 2020-09-05 NOTE — Telephone Encounter (Signed)
Left message #1

## 2020-09-07 NOTE — Telephone Encounter (Signed)
Patient has already been rescheduled.

## 2020-09-11 ENCOUNTER — Other Ambulatory Visit: Payer: Self-pay | Admitting: Physical Medicine and Rehabilitation

## 2020-09-11 MED ORDER — AMITRIPTYLINE HCL 10 MG PO TABS: 10.0000 mg | ORAL_TABLET | Freq: Every evening | ORAL | 0 refills | Status: DC | PRN

## 2020-09-13 ENCOUNTER — Encounter
Payer: Medicare Other | Attending: Physical Medicine and Rehabilitation | Admitting: Physical Medicine and Rehabilitation

## 2020-09-13 ENCOUNTER — Other Ambulatory Visit: Payer: Self-pay

## 2020-09-13 ENCOUNTER — Encounter: Payer: Self-pay | Admitting: Physical Medicine and Rehabilitation

## 2020-09-13 VITALS — BP 137/71 | HR 75 | Temp 98.5°F | Ht 64.0 in | Wt 218.0 lb

## 2020-09-13 DIAGNOSIS — M7061 Trochanteric bursitis, right hip: Secondary | ICD-10-CM | POA: Insufficient documentation

## 2020-09-13 DIAGNOSIS — M1611 Unilateral primary osteoarthritis, right hip: Secondary | ICD-10-CM

## 2020-09-13 DIAGNOSIS — M47812 Spondylosis without myelopathy or radiculopathy, cervical region: Secondary | ICD-10-CM | POA: Diagnosis present

## 2020-09-13 DIAGNOSIS — M797 Fibromyalgia: Secondary | ICD-10-CM | POA: Insufficient documentation

## 2020-09-13 DIAGNOSIS — K5903 Drug induced constipation: Secondary | ICD-10-CM | POA: Diagnosis present

## 2020-09-13 NOTE — Progress Notes (Signed)
Subjective:    Patient ID: Sheryl Curtis, female    DOB: 04-22-1932, 86 y.o.   MRN: [id number]   HPI  Sheryl Curtis is an 86 year old woman who presents for follow-up of her fibromyalgia, left shoulder pain, cervical facet arthropathy, and with new right sided greater trochanteric pain syndrome and right hip OA.  She did not have benefit from the trigger point injections last visit.   Her right hip pain continues to be severe. She has been unable to ambulate much due to this pain.   She was started on Plavix after her TIA.   She has a follow-up appointment with Dr. Ernestina Patches on December 8th. Her cervical pain continues to be severe and she benefited from injections in the past.  Prior history:  Currently her chief complaint is her right sided hip pain. She recently had a steroid injection that gave her no relief. Discussed lidocaine/normal saline trigger point injections and she elected to try these today.  Her pain has been getting unbearable. She stopped getting relief from the oxycodone. Discussed restarting Lyrica which helped her at lower doses. Higher dose caused her to have the shakes and made her go to ED.   Other medications she has failed include: Tramadol, Tylenol, Percocet, NSAIDs, Cymbalta, Amitriptyline, Tylenol with codeine, Skelaxin, Flexeril, and Tizanidine.   She does not take NSAIDs due to her cardiac history.   Prior history: Sheryl Curtis presentsfor follow-up of her fibromyalgia, chronic left shoulder pain,andcervical spondylosis. She has pain in her bilateral shoulders, neck, bilateral hips, knees, ankles, and low back. She has been diagnosed with fibromyalgia, osteoarthritis, and rheumatoid arthritis.Previously I started her on Percocet after she has failed or refused most other medications (Lyrica, Tramadol, Tylenol, NSAIDs, Cymbalta, Amitriptyline, Tylenol with codeine, Skelaxin, Flexeril, Tizanidine), and she has noted improvement in her quality of life. She  has been going outside more and has been enjoying her new puppy.She has been taking about 2 Percocets per day. She has noted increasing constipation. She has been taking Miralax as needed. She has a history of hemorrhoids.   She continues to have neck pain especially when turning her neck to the left side. This pain has prevented her from driving and is currently her most severe source of pain. She had cervical facet joint blocks by Dr. Asa Lente at the [facility] without benefit. I have personally reviewed these notes and they were done on the left side at levels C2-C4. She follows with Dr. Lucille Passy her low back pain and has plan for lumbar facet joint radiofrequency ablation after insurance authorization, scheduled in August.   She has been sleeping poorly over the last few days and pain levels have also increased. She lives with her daughter. She has a bedside commode for urination at night.   She would like to lose weight. Current weight is 209 lbs and she would like to get down to 185 lbs. She starts eating breakfast at 1pm and eats dinner at 5:30pm. She like to eat toast with eggs and tomato for breakfast. She likes to eat 6 hershey kisses as a snack but does not snack much beyond this.   Pain Inventory Average Pain 9 Pain Right Now 9 My pain is constant, sharp and stabbing  In the last 24 hours, has pain interfered with the following? General activity 9 Relation with others 10 Enjoyment of life 10 What TIME of day is your pain at its worst? all Sleep (in general) Fair  Pain  is worse with: walking and standing Pain improves with: rest, heat/ice and medication Relief from Meds: 8    Family History  Problem Relation Age of Onset  . CAD Brother   . Heart attack Brother 27  . Bone cancer Sister   . Throat cancer Daughter    Social History   Socioeconomic History  . Marital status: Widowed    Spouse name: Not on file  . Number of children: 4  . Years of  education: Not on file  . Highest education level: Not on file  Occupational History  . Not on file  Tobacco Use  . Smoking status: Former Smoker    Packs/day: 1.00    Years: 12.00    Pack years: 12.00    Types: Cigarettes    Start date: 10/28/1949    Quit date: 10/28/1962    Years since quitting: 57.9  . Smokeless tobacco: Never Used  Vaping Use  . Vaping Use: Never used  Substance and Sexual Activity  . Alcohol use: No  . Drug use: No  . Sexual activity: Not on file  Other Topics Concern  . Not on file  Social History Narrative  . Not on file   Social Determinants of Health   Financial Resource Strain:   . Difficulty of Paying Living Expenses: Not on file  Food Insecurity: No Food Insecurity  . Worried About Charity fundraiser in the Last Year: Never true  . Ran Out of Food in the Last Year: Never true  Transportation Needs: No Transportation Needs  . Lack of Transportation (Medical): No  . Lack of Transportation (Non-Medical): No  Physical Activity:   . Days of Exercise per Week: Not on file  . Minutes of Exercise per Session: Not on file  Stress:   . Feeling of Stress : Not on file  Social Connections:   . Frequency of Communication with Friends and Family: Not on file  . Frequency of Social Gatherings with Friends and Family: Not on file  . Attends Religious Services: Not on file  . Active Member of Clubs or Organizations: Not on file  . Attends Archivist Meetings: Not on file  . Marital Status: Not on file   Past Surgical History:  Procedure Laterality Date  . ABDOMINAL HYSTERECTOMY    . ANKLE FRACTURE SURGERY    . BACK SURGERY    . BREAST SURGERY    . COLON SURGERY    . CORONARY STENT PLACEMENT    . PERCUTANEOUS CORONARY STENT INTERVENTION (PCI-S) N/A 05/19/2013   Procedure: PERCUTANEOUS CORONARY STENT INTERVENTION (PCI-S);  Surgeon: Jettie Booze, MD;  Location: [facility] CATH LAB;  Service: Cardiovascular;  Laterality: N/A;   Past Medical  History:  Diagnosis Date  . ALLERGIC RHINITIS   . Atrial fibrillation ([place])   . CANCER, COLORECTAL   . CHEST PAIN, ATYPICAL   . CKD (chronic kidney disease)   . DYSPNEA   . Essential hypertension, benign   . FIBROMYALGIA   . G E R D   . History of stress test    Myoview 8/16:  EF 69%, anterior and apical defect c/w breast attenuation; Low Risk   . INSOMNIA   . Morbid obesity ([place])   . Other and unspecified angina pectoris   . Sleep apnea    There were no vitals taken for this visit.  Opioid Risk Score:   Fall Risk Score:  `1  Depression screen PHQ 2/9  Depression screen [facility] 2/9  05/03/2020 03/07/2020 01/21/2020 10/25/2019 07/22/2019 04/21/2019 01/13/2019  Decreased Interest 0 0 - 0 0 0 1  Down, Depressed, Hopeless 0 0 2 0 0 0 1  PHQ - 2 Score 0 0 2 0 0 0 2  Altered sleeping - - 1 - - - 1  Tired, decreased energy - - 0 - - - 1  Change in appetite - - 0 - - - 0  Feeling bad or failure about yourself  - - 1 - - - 1  Trouble concentrating - - 1 - - - 1  Moving slowly or fidgety/restless - - 0 - - - 0  Suicidal thoughts - - 0 - - - 0  PHQ-9 Score - - 5 - - - 6  Difficult doing work/chores - - Somewhat difficult - - - Somewhat difficult  Some recent data might be hidden   Review of Systems  Constitutional: Positive for appetite change.       Taste is back   HENT: Negative.   Eyes: Negative.   Respiratory: Positive for shortness of breath and wheezing.   Cardiovascular: Positive for chest pain.  Gastrointestinal: Positive for constipation.  Endocrine: Negative.   Genitourinary: Negative.   Musculoskeletal: Positive for arthralgias, back pain, gait problem, myalgias and neck pain.  Skin: Negative.   Allergic/Immunologic: Negative.   Neurological: Positive for weakness and numbness.       Tingling & spasms  Hematological: Bruises/bleeds easily.  Psychiatric/Behavioral: Negative.        Depression, anxiety       Objective:   Physical Exam Gen: no distress, normal  appearing HEENT: oral mucosa pink and moist, NCAT Cardio: Reg rate Chest: normal effort, normal rate of breathing Abd: soft, non-distended Ext: no edema Skin: intact Psych: pleasant, normal affect Cervical ROM very limited in all planes. Tenderness to palpation over lateral cervical facet joints bilaterally In wheelchair Right sided positive slump test for pain in L5 nerve distribution. Tenderness to palpation over right greater trochanteric bursa Psych: pleasant, normal affect      Assessment & Plan:  Mrs. Creedence Heiss presents with diffuse pains. She has pains in her bilateral shoulders, neck, bilateral hips, knees, ankles, and low back.  1) Fibromyalgia: -previously discontinued Lyrica as she had developed tremor at high dose. Can consider starting low dose again if she is agreeable as this did help initially.  --Had negative side effects in response to Skelaxin, Tramadol, and Tylenol with Codeine, Percocet, but prefers to continue Percocet and asks if strength may be increased. Discussed that this could worsen her constipation and that it is common to get tolerant to opioids. Can increase Percocet dose to 7.5mg  TID.   2) Chronic left shoulder pain:I have personally reviewed XR; within normal limits.Can consider steroid injection in the future.   3)Lumbar facet arthropathy: Followed with Dr. Ernestina Patches for radiofrequency ablation in August  4) Recent LMB:[id number] medications. She is on statin and Plavix. BP is much better controlled this visit!  5) Quality of Life: Overall improved. Encourage outdoor mobility and fresh air. Has strong social support of 3 daughters, son, and now new dog which gives her much joy.  7) Obesity 217 lbs: Provided dietary counseling.   8) Cervical facet arthritis, worse on left side, now contributing to cervicogenic headaches. Can consider trigger point injections.   7) Insomnia: prescribed Amitriptyline 10mg  HS previously. Advised  regarding cardiac side effects and risk of falls but patient prefers to try medication prior to clearance from cardiologist. Advised her  daughter to monitor her first night to decrease risk of fall as she sometimes rises to use bedside commode at night.   8) Right sided greater trochanteric pain syndrome/OA: performed steroid injection as follows:  Trochanteric bursa injection  Indication Trochanteric bursitis. Exam has tenderness over the greater trochanter of the hip. Pain has not responded to conservative care such as exercise therapy and oral medications. Pain interferes with sleep or with mobility Informed consent was obtained after describing risks and benefits of the procedure with the patient these include bleeding bruising and infection. Patient has signed written consent form. Patient placed in a lateral decubitus position with the affected hip superior. Point of maximal pain was palpated marked and prepped with Betadine and entered with a needle to bone contact. Needle slightly withdrawn then 1cc Kenalog with 4 cc 1% lidocaine were injected. Patient tolerated procedure well. Post procedure instructions given.  9) Opioid induced constipation: -6 prunes with juice -If does not help, mix Miralax in warm prune juice -If does not help, start Colace TID -If does not help, start Senna 2 tabs at night -Let me know if this does not help -Recommended high fiber diet and mobility as much as tolerated -Stay well hydrated

## 2020-09-13 NOTE — Patient Instructions (Addendum)
Miralax in warmed prune juice High fiber diet Colace TID Senna 2 tablets at night.   Magnesium supplement at night  Turmeric to reduce inflammation--can be used in cooking or taken as a supplement.  Epsom salts  Benefits of turmeric:  -Highly anti-inflammatory  -Increases antioxidants  -Improves memory, attention, brain disease  -Lowers risk of heart disease  -May help prevent cancer  -Decreases pain  -Alleviates depression  -Delays aging and decreases risk of chronic disease  -Consume with black pepper to increase absorption    Turmeric Milk Recipe:  1 cup milk  1 tsp turmeric  1 tsp cinnamon  1 tsp grated ginger (optional)  Black pepper (boosts the anti-inflammatory properties of turmeric).  1 tsp honey

## 2020-09-18 ENCOUNTER — Other Ambulatory Visit: Payer: Self-pay

## 2020-09-18 ENCOUNTER — Encounter: Payer: Self-pay | Admitting: Pulmonary Disease

## 2020-09-18 ENCOUNTER — Ambulatory Visit: Payer: Medicare Other | Admitting: Pulmonary Disease

## 2020-09-18 ENCOUNTER — Ambulatory Visit (INDEPENDENT_AMBULATORY_CARE_PROVIDER_SITE_OTHER): Payer: Medicare Other

## 2020-09-18 VITALS — BP 136/62 | HR 86 | Temp 97.3°F | Ht 64.0 in | Wt 210.6 lb

## 2020-09-18 DIAGNOSIS — G4733 Obstructive sleep apnea (adult) (pediatric): Secondary | ICD-10-CM | POA: Diagnosis not present

## 2020-09-18 DIAGNOSIS — Z79899 Other long term (current) drug therapy: Secondary | ICD-10-CM | POA: Diagnosis not present

## 2020-09-18 DIAGNOSIS — R0602 Shortness of breath: Secondary | ICD-10-CM | POA: Diagnosis not present

## 2020-09-18 DIAGNOSIS — R5381 Other malaise: Secondary | ICD-10-CM

## 2020-09-18 DIAGNOSIS — J4530 Mild persistent asthma, uncomplicated: Secondary | ICD-10-CM

## 2020-09-18 DIAGNOSIS — Z0000 Encounter for general adult medical examination without abnormal findings: Secondary | ICD-10-CM | POA: Insufficient documentation

## 2020-09-18 LAB — COMPREHENSIVE METABOLIC PANEL
ALT: 20 U/L (ref 0–35)
AST: 16 U/L (ref 0–37)
Albumin: 4.4 g/dL (ref 3.5–5.2)
Alkaline Phosphatase: 46 U/L (ref 39–117)
BUN: 25 mg/dL — ABNORMAL HIGH (ref 6–23)
CO2: 28 mEq/L (ref 19–32)
Calcium: 9.9 mg/dL (ref 8.4–10.5)
Chloride: 102 mEq/L (ref 96–112)
Creatinine, Ser: 1.44 mg/dL — ABNORMAL HIGH (ref 0.40–1.20)
GFR: 32.9 mL/min — ABNORMAL LOW (ref >60.00)
Glucose, Bld: 88 mg/dL (ref 70–99)
Potassium: 4.7 mEq/L (ref 3.5–5.1)
Sodium: 140 mEq/L (ref 135–145)
Total Bilirubin: 0.4 mg/dL (ref 0.2–1.2)
Total Protein: 7.5 g/dL (ref 6.0–8.3)

## 2020-09-18 LAB — CBC WITH DIFFERENTIAL/PLATELET
Basophils Absolute: 0 10*3/uL (ref 0.0–0.1)
Basophils Relative: 0.3 % (ref 0.0–3.0)
Eosinophils Absolute: 0 10*3/uL (ref 0.0–0.7)
Eosinophils Relative: 0.2 % (ref 0.0–5.0)
HCT: 41.9 % (ref 36.0–46.0)
Hemoglobin: 13.9 g/dL (ref 12.0–15.0)
Lymphocytes Relative: 15.3 % (ref 12.0–46.0)
Lymphs Abs: 2.2 10*3/uL (ref 0.7–4.0)
MCHC: 33.2 g/dL (ref 30.0–36.0)
MCV: 95.4 fl (ref 78.0–100.0)
Monocytes Absolute: 1.3 10*3/uL — ABNORMAL HIGH (ref 0.1–1.0)
Monocytes Relative: 9.5 % (ref 3.0–12.0)
Neutro Abs: 10.6 10*3/uL — ABNORMAL HIGH (ref 1.4–7.7)
Neutrophils Relative %: 74.7 % (ref 43.0–77.0)
Platelets: 254 10*3/uL (ref 150.0–400.0)
RBC: 4.39 Mil/uL (ref 3.87–5.11)
RDW: 14.5 % (ref 11.5–15.5)
WBC: 14.2 10*3/uL — ABNORMAL HIGH (ref 4.0–10.5)

## 2020-09-18 MED ORDER — BREZTRI AEROSPHERE 160-9-4.8 MCG/ACT IN AERO: 2.0000 | INHALATION_SPRAY | Freq: Two times a day (BID) | RESPIRATORY_TRACT | 0 refills | Status: AC

## 2020-09-18 NOTE — Assessment & Plan Note (Signed)
Patient refuses flu vaccine Patient currently unvaccinated COVID-75 despite previous infection from COVID-19, strongly encouraged the patient consider obtaining COVID-19 vaccination given her high risk for complications should she contracted again Patient was initially reluctant but then requested patient education information.  Patient education information on COVID-19 vaccinations provided from up-to-date as well as [employer]

## 2020-09-18 NOTE — Progress Notes (Signed)
@Patient  ID: Sheryl Curtis, female    DOB: 07-20-1932, 86 y.o.   MRN: [id number]  Chief Complaint  Patient presents with  . Follow-up    Increased sob x 2-3 wks., cough-small yellow at times, wheezing    Referring provider: Alroy Dust, L.Marlou Sa, MD  HPI:  86 year old female former smoker followed in our office for asthma, obstructive sleep apnea, nocturnal hypoxia, allergic rhinitis  PMH: Allergic rhinitis, A. fib, CAD, hypertension Smoker/ Smoking History: Former smoker.  Quit 1964.  12-pack-year smoking history. Maintenance: Symbicort 160 Pt of: Dr. Elsworth Soho  09/18/2020  - Visit   86 year old female former smoker followed in our office for asthma, obstructive sleep apnea and allergic rhinitis.  Patient was previously managed on CPAP therapy.  She stopped this in 2020.  Patient presenting to her office today due to cough, congestion, intermittent wheezing.  She also feels that her shortness of breath is worsened.  Patient is quite confused on her medications.  She is unsure which inhaler she is taking.  She has both Stiolto and Symbicort 160 on her med list.  She initially reported that she was taking both of these.  Then upon further discussion she reports that she is taking Librarian, academic.  Patient is also not taking Brester as prescribed.  She reports that she "takes it when she feels that she needs it".  Patient presents today with her daughter who is her primary caregiver.  Unfortunately patient had a mini stroke about 4 to 5 months ago and she has been continuing to struggle with her medications.  Weight is also up around 20 pounds over the last 6 months.  She reports that her legs are more swollen.  She reports adherence to her furosemide daily.  She has not had blood work to further investigate this.  Her diuretics are managed by primary care.  She reports that she cannot take the flu vaccine.  She also remains unvaccinated COVID-19.  Despite a previous COVID-19 diagnosis.  We will discuss  this today.  Questionaires / Pulmonary Flowsheets:   ACT:  Asthma Control Test ACT Total Score  09/18/2020 6  09/06/2019 9    MMRC: mMRC Dyspnea Scale mMRC Score  09/18/2020 4    Tests:   CTchest 03/2017 -stable multiple pulmonary nodules since 2015  Spirometry in 05/2010 showed FEV1 of 76% improvement postbronchodilator to 80%-1.60 with a ratio of 75 suggesting mild restriction. Spirometry 03/2014 FEV1 of 1.25-64%, with ratio 65 and FVC of 1.91-72%.  PFTs - 12/2014 - no airway obstruction, ratio 71, pos BD response PFTs 11/2018, and mild airway obstruction with FEV1 79%, TLC 101%, DLCO normal, no bronchodilator response  06/2015 Admit for UTI and Sepsis   NPSG >> AHI 10/h corrected by CPAP 10 cm  FENO:  No results found for: NITRICOXIDE  PFT: PFT Results Latest Ref Rng & Units 12/09/2018 03/17/2017 01/10/2015  FVC-Pre L 1.76 1.98 1.97  FVC-Predicted Pre % 82 91 86  FVC-Post L 1.87 2.00 2.31  FVC-Predicted Post % 87 91 101  Pre FEV1/FVC % % 70 70 71  Post FEV1/FCV % % 72 76 71  FEV1-Pre L 1.24 1.40 1.41  FEV1-Predicted Pre % 79 87 84  FEV1-Post L 1.36 1.52 1.64  DLCO uncorrected ml/min/mmHg 17.07 20.63 19.78  DLCO UNC% % 100 98 94  DLCO corrected ml/min/mmHg 17.57 21.87 -  DLCO COR %Predicted % 103 104 -  DLVA Predicted % 108 118 100  TLC L 4.75 4.73 4.87  TLC % Predicted % 101  101 104  RV % Predicted % 122 117 116    WALK:  SIX MIN WALK 10/29/2018 03/03/2017 12/01/2014 04/11/2014  Supplimental Oxygen during Test? (L/min) No No No No  Tech Comments: pt walked at moderate pace with c/o sob. pt unable to complete lap 2 or 3.  pt walked 1 lap at a slow pace with cane, did not walk laps 2 and 3 d/t knee pain, sob, and chest tightness.  test stopped d/t hip discomfort -    Imaging: No results found.  Lab Results:  CBC    Component Value Date/Time   WBC 10.0 01/27/2020 1822   RBC 4.23 01/27/2020 1822   HGB 12.6 01/27/2020 2013   HCT 37.0 01/27/2020 2013   PLT 272  01/27/2020 1822   MCV 96.0 01/27/2020 1822   MCH 30.7 01/27/2020 1822   MCHC 32.0 01/27/2020 1822   RDW 14.1 01/27/2020 1822   LYMPHSABS 1.9 01/27/2020 1822   MONOABS 1.0 01/27/2020 1822   EOSABS 0.2 01/27/2020 1822   BASOSABS 0.1 01/27/2020 1822    BMET    Component Value Date/Time   NA 137 01/27/2020 2013   NA 136 12/24/2019 1601   K 3.2 (L) 01/27/2020 2013   CL 98 01/27/2020 2013   CO2 29 01/27/2020 1822   GLUCOSE 104 (H) 01/27/2020 2013   BUN 22 01/27/2020 2013   BUN 20 12/24/2019 1601   CREATININE 1.20 (H) 01/27/2020 2013   CALCIUM 9.1 01/27/2020 1822   GFRNONAA 45 (L) 01/27/2020 1822   GFRAA 52 (L) 01/27/2020 1822    BNP    Component Value Date/Time   BNP 82.0 07/01/2015 1653    ProBNP    Component Value Date/Time   PROBNP 58.0 10/29/2018 1245    Specialty Problems      Pulmonary Problems   Allergic rhinitis    Qualifier: Diagnosis of  By: Lamonte Sakai MD, Robert S       DYSPNEA           Pulmonary nodules/lesions, multiple    Noted 03/2014 on CT abd -stable 12/2014, stable4/2016 except for new 59mm RLL >repeat 1 yr ~01/2017       CAFL (chronic airflow limitation) (HCC)   Asthma   Nocturnal hypoxia    Per O&O 02/2017      OSA (obstructive sleep apnea)    Mild AHI 10/h CPAP 10 cm  Patient stopped CPAP therapy in February/2020      Cough   Mild persistent asthma with (acute) exacerbation   Mild persistent asthma without complication      Allergies  Allergen Reactions  . Aspirin Anaphylaxis, Hives and Shortness Of Breath  . Hydroxyquinolines Hives  . Imipramine Hcl Other (See Comments)    Unknown reaction  . Pamelor [Nortriptyline Hcl] Other (See Comments)    Unknown reaction  . Prednisone Shortness Of Breath, Itching and Other (See Comments)    anxiety  . Pregabalin Other (See Comments)    Gives her the shakes  . Procardia [Nifedipine] Other (See Comments)    Unknown reaction  . Tramadol Swelling  . Ace Inhibitors Other (See  Comments)    cough  . Influenza Vaccines Other (See Comments)    High fever, couldn't get out of bed  . Plaquenil [Hydroxychloroquine Sulfate] Rash    Immunization History  Administered Date(s) Administered  . Pneumococcal Conjugate-13 11/23/2013  . Pneumococcal Polysaccharide-23 08/27/2007, 04/21/2019  . Tdap 08/04/2008  . Zoster Recombinat (Shingrix) 02/19/2017, 09/24/2017    Past Medical History:  Diagnosis Date  . ALLERGIC RHINITIS   . Atrial fibrillation ([place])   . CANCER, COLORECTAL   . CHEST PAIN, ATYPICAL   . CKD (chronic kidney disease)   . DYSPNEA   . Essential hypertension, benign   . FIBROMYALGIA   . G E R D   . History of stress test    Myoview 8/16:  EF 69%, anterior and apical defect c/w breast attenuation; Low Risk   . INSOMNIA   . Morbid obesity ([place])   . Other and unspecified angina pectoris   . Sleep apnea     Tobacco History: Social History   Tobacco Use  Smoking Status Former Smoker  . Packs/day: 1.00  . Years: 12.00  . Pack years: 12.00  . Types: Cigarettes  . Start date: 10/28/1949  . Quit date: 10/28/1962  . Years since quitting: 57.9  Smokeless Tobacco Never Used   Counseling given: Not Answered   Continue to not smoke  Outpatient Encounter Medications as of 09/18/2020  Medication Sig  . acetaminophen (TYLENOL) 650 MG CR tablet Take 650 mg by mouth every 8 (eight) hours as needed for pain.   Marland Kitchen albuterol (PROVENTIL) (2.5 MG/3ML) 0.083% nebulizer solution USE 1 VIAL VIA NEBULIZER EVERY 6 HOURS AS NEEDED (Patient taking differently: Take 2.5 mg by nebulization in the morning and at bedtime. )  . Albuterol Sulfate (PROAIR RESPICLICK) 607 (90 Base) MCG/ACT AEPB Inhale 1-2 puffs into the lungs every 6 (six) hours as needed (for shortness of breath and/or wheezing.). (Patient taking differently: Inhale 1 puff into the lungs in the morning and at bedtime. )  . ALPRAZolam (XANAX) 0.25 MG tablet Take 0.25 mg by mouth 3 (three) times daily as needed  for anxiety.  . Ascorbic Acid (VITAMIN C) 1000 MG tablet Take 1,000 mg by mouth daily.  . Cholecalciferol (VITAMIN D) 50 MCG (2000 UT) tablet Take 2,000 Units by mouth daily.  . clopidogrel (PLAVIX) 75 MG tablet TAKE 1 TABLET(75 MG) BY MOUTH EVERY OTHER DAY (Patient taking differently: Take 75 mg by mouth daily. )  . diphenhydrAMINE (BENADRYL ALLERGY) 25 mg capsule Take 1 capsule (25 mg total) by mouth at bedtime as needed.  . DULoxetine (CYMBALTA) 60 MG capsule Take 60 mg by mouth daily.  . furosemide (LASIX) 40 MG tablet TAKE 1 TABLET BY MOUTH  DAILY  . isosorbide mononitrate (IMDUR) 60 MG 24 hr tablet TAKE 1 TABLET BY MOUTH  DAILY  . Nebulizers (COMPRESSOR NEBULIZER) MISC 1 Device by Does not apply route once.  . nitroGLYCERIN (NITROSTAT) 0.4 MG SL tablet Place 1 tablet (0.4 mg total) under the tongue every 5 (five) minutes as needed for chest pain.  . Omega-3 Fatty Acids (FISH OIL) 1200 MG CAPS Take 1,200 mg by mouth daily.  Marland Kitchen oxyCODONE-acetaminophen (PERCOCET) 7.5-325 MG tablet Take 1 tablet by mouth 3 (three) times daily as needed for severe pain.  . polyethylene glycol (MIRALAX / GLYCOLAX) 17 g packet Take 17 g by mouth daily as needed for mild constipation.   . potassium chloride SA (KLOR-CON) 20 MEQ tablet TAKE 1 TABLET BY MOUTH  DAILY  . pregabalin (LYRICA) 25 MG capsule Take 1 capsule (25 mg total) by mouth daily.  . rosuvastatin (CRESTOR) 20 MG tablet Take 1 tablet (20 mg total) by mouth daily.  . Tiotropium Bromide-Olodaterol (STIOLTO RESPIMAT) 2.5-2.5 MCG/ACT AERS Inhale 2 puffs into the lungs daily.  . valsartan-hydrochlorothiazide (DIOVAN-HCT) 160-12.5 MG tablet Take 1 tablet by mouth daily.  . [DISCONTINUED] budesonide-formoterol (SYMBICORT) 160-4.5  MCG/ACT inhaler Inhale 2 puffs into the lungs 2 (two) times daily.  Marland Kitchen amitriptyline (ELAVIL) 10 MG tablet Take 1 tablet (10 mg total) by mouth at bedtime as needed for sleep. (Patient not taking: Reported on 09/18/2020)  .  Budeson-Glycopyrrol-Formoterol (BREZTRI AEROSPHERE) 160-9-4.8 MCG/ACT AERO Inhale 2 puffs into the lungs in the morning and at bedtime.  . carbamide peroxide (DEBROX) 6.5 % OTIC solution Place 5 drops into both ears 2 (two) times daily. (Patient not taking: Reported on 09/18/2020)  . docusate sodium (COLACE) 100 MG capsule Take 1 capsule (100 mg total) by mouth in the morning, at noon, and at bedtime. (Patient not taking: Reported on 09/18/2020)  . gabapentin (NEURONTIN) 300 MG capsule Take 1 capsule (300 mg total) by mouth at bedtime. (Patient not taking: Reported on 09/18/2020)  . ipratropium (ATROVENT) 0.03 % nasal spray Place 2 sprays into both nostrils every 12 (twelve) hours. (Patient not taking: Reported on 09/18/2020)  . Respiratory Therapy Supplies (FLUTTER) DEVI Use flutter device 3 times a day (Patient not taking: Reported on 09/18/2020)  . senna (SENOKOT) 8.6 MG TABS tablet Take 2 tablets (17.2 mg total) by mouth at bedtime. (Patient not taking: Reported on 09/18/2020)  . [DISCONTINUED] Tiotropium Bromide-Olodaterol (STIOLTO RESPIMAT) 2.5-2.5 MCG/ACT AERS Inhale 2 puffs into the lungs daily. (Patient not taking: Reported on 09/18/2020)   No facility-administered encounter medications on file as of 09/18/2020.     Review of Systems  Review of Systems  Constitutional: Positive for fatigue. Negative for activity change and fever.  HENT: Positive for congestion (yellow ). Negative for sinus pressure, sinus pain and sore throat.   Respiratory: Positive for cough, chest tightness and shortness of breath. Negative for wheezing.   Cardiovascular: Negative for chest pain and palpitations.  Gastrointestinal: Negative for diarrhea, nausea and vomiting.  Musculoskeletal: Negative for arthralgias.  Neurological: Negative for dizziness.  Psychiatric/Behavioral: Negative for sleep disturbance. The patient is not nervous/anxious.      Physical Exam  BP 136/62 (BP Location: Left Arm, Cuff  Size: Large)   Pulse 86   Temp (!) 97.3 F (36.3 C) (Temporal)   Ht 5\' 4"  (1.626 m)   Wt 210 lb 9.6 oz (95.5 kg)   SpO2 97%   BMI 36.15 kg/m   Wt Readings from Last 5 Encounters:  09/18/20 210 lb 9.6 oz (95.5 kg)  09/13/20 218 lb (98.9 kg)  08/16/20 217 lb 6.4 oz (98.6 kg)  05/03/20 209 lb (94.8 kg)  04/05/20 202 lb 6.4 oz (91.8 kg)    BMI Readings from Last 5 Encounters:  09/18/20 36.15 kg/m  09/13/20 37.42 kg/m  08/16/20 37.32 kg/m  05/03/20 35.87 kg/m  04/05/20 34.74 kg/m     Physical Exam Vitals and nursing note reviewed.  Constitutional:      General: She is not in acute distress.    Appearance: Normal appearance. She is obese.  HENT:     Head: Normocephalic and atraumatic.     Right Ear: External ear normal.     Left Ear: External ear normal.     Nose: Rhinorrhea present. No congestion.     Mouth/Throat:     Mouth: Mucous membranes are moist.     Pharynx: Oropharynx is clear.     Comments: Postnasal drip Eyes:     Pupils: Pupils are equal, round, and reactive to light.  Cardiovascular:     Rate and Rhythm: Normal rate and regular rhythm.     Pulses: Normal pulses.     Heart  sounds: Normal heart sounds. No murmur heard.   Pulmonary:     Effort: Pulmonary effort is normal. No respiratory distress.     Breath sounds: Normal breath sounds. No decreased air movement. No decreased breath sounds, wheezing or rales.  Musculoskeletal:     Cervical back: Normal range of motion.     Right lower leg: 3+ Edema present.     Left lower leg: 3+ Edema present.     Comments: Compression stockings applied  Skin:    General: Skin is warm and dry.     Capillary Refill: Capillary refill takes less than 2 seconds.  Neurological:     General: No focal deficit present.     Mental Status: She is alert and oriented to person, place, and time. Mental status is at baseline.     Gait: Gait normal.  Psychiatric:        Mood and Affect: Mood normal.        Behavior:  Behavior normal.        Thought Content: Thought content normal.        Judgment: Judgment normal.       Assessment & Plan:   Mild persistent asthma without complication Plan: Continue Breztri  Encourage patient to take inhaler as prescribed Lab work today Chest x-ray today Strongly encourage patient to consider obtaining COVID-19 vaccinations, patient education information provided for patient today from up-to-date as well as ATS  OSA (obstructive sleep apnea) Plan: Continue to monitor clinically  DYSPNEA Patient with acute on chronic dyspnea This is likely related to the patient's physical deconditioning, nonadherence to her inhalers as well as suspected fluid retention Weight is up around 20 pounds over the last 6 months  Plan: Lab work today Encourage patient to resume inhaler use as prescribed Chest x-ray today  Healthcare maintenance Patient refuses flu vaccine Patient currently unvaccinated COVID-19 despite previous infection from COVID-19, strongly encouraged the patient consider obtaining COVID-19 vaccination given her high risk for complications should she contracted again Patient was initially reluctant but then requested patient education information.  Patient education information on COVID-19 vaccinations provided from up-to-date as well as [employer]  Medication management Emphasized the importance of taking your inhalers as prescribed 2 puffs taken every 12 hours Reviewed with family that they also need to be supporting the patient to ensure that she is using her maintenance inhalers properly  Physical deconditioning This is a component to the patient's dyspnea  Plan: Encourage patient to increase overall physical activity    Return in about 2 months (around 11/18/2020), or if symptoms worsen or fail to improve, for Follow up with Dr. Elsworth Soho.   Lauraine Rinne, NP 09/18/2020   This appointment required 42 minutes of patient care (this  includes precharting, chart review, review of results, face-to-face care, etc.).

## 2020-09-18 NOTE — Assessment & Plan Note (Signed)
This is a component to the patient's dyspnea  Plan: Encourage patient to increase overall physical activity

## 2020-09-18 NOTE — Patient Instructions (Addendum)
You were seen today by Lauraine Rinne, NP  for:   I am concerned about your shortness of breath. I believe this is likely related to fluid retention as well as nonadherence with your inhaler. As we reviewed today these are very important things that we need to evaluate. We will get a chest x-ray as well as lab work today. Please continue to take your medication as prescribed. Please start taking your Breztri inhaler as prescribed. See the information listed below.  We will see you back in 2 months with a follow-up with Dr. Elsworth Soho.  Take care and stay safe. Have a very happy holidays and a great Thanksgiving.  Aaron Edelman  1. Shortness of breath  - Pro b natriuretic peptide (BNP); Future - DG Chest 2 View; Future - Comp Met (CMET); Future - CBC with Differential/Platelet; Future  Chest x-ray today  Lab work today  Continue take your fluid pill  Would recommend that you obtain a scale at home to monitor your weights  2. OSA (obstructive sleep apnea)  You have known obstructive sleep apnea. You are no longer using CPAP. We will continue to monitor this clinically.  3. Mild persistent asthma without complication 4. Medication management  We will remove Symbicort 160 from your med list  We will remove Stiolto Respimat from your med list  We will add Breztri to your med list   Judithann Sauger >>> 2 puffs in the morning right when you wake up, rinse out your mouth after use, 12 hours later 2 puffs, rinse after use >>> Take this daily, no matter what >>> This is not a rescue inhaler     5. Healthcare maintenance  We recommend that you obtain the COVID-19 vaccinations as you are at high risk of complications if you did contract COVID-19.   We recommend today:  Orders Placed This Encounter  Procedures  . DG Chest 2 View    Standing Status:   Future    Number of Occurrences:   1    Standing Expiration Date:   01/16/2021    Order Specific Question:   Reason for Exam (SYMPTOM  OR DIAGNOSIS  REQUIRED)    Answer:   doe, asthma    Order Specific Question:   Preferred imaging location?    Answer:   Internal    Order Specific Question:   Radiology Contrast Protocol - do NOT remove file path    Answer:   [email or web address]  . Pro b natriuretic peptide (BNP)    Standing Status:   Future    Number of Occurrences:   1    Standing Expiration Date:   09/18/2021  . Comp Met (CMET)    Standing Status:   Future    Number of Occurrences:   1    Standing Expiration Date:   09/18/2021  . CBC with Differential/Platelet    Standing Status:   Future    Number of Occurrences:   1    Standing Expiration Date:   09/18/2021   Orders Placed This Encounter  Procedures  . DG Chest 2 View  . Pro b natriuretic peptide (BNP)  . Comp Met (CMET)  . CBC with Differential/Platelet   No orders of the defined types were placed in this encounter.   Follow Up:    Return in about 2 months (around 11/18/2020), or if symptoms worsen or fail to improve, for Follow up with Dr. Elsworth Soho.   Notification of test results are managed in the following manner: If  there are  any recommendations or changes to the  plan of care discussed in office today,  we will contact you and let you know what they are. If you do not hear from [place], then your results are normal and you can view them through your  MyChart account , or a letter will be sent to you. Thank you again for trusting [place] with your care  - Thank you, [facility]    It is flu season:   >>> Best ways to protect herself from the flu: Receive the yearly flu vaccine, practice good hand hygiene washing with soap and also using hand sanitizer when available, eat a nutritious meals, get adequate rest, hydrate appropriately       Please contact the office if your symptoms worsen or you have concerns that you are not improving.   Thank you for choosing [facility] for your healthcare, and for  allowing [place] to partner with you on your healthcare journey. I am thankful to be able to provide care to you today.   Wyn Quaker FNP-C

## 2020-09-18 NOTE — Assessment & Plan Note (Signed)
Plan: Continue to monitor clinically 

## 2020-09-18 NOTE — Assessment & Plan Note (Signed)
Emphasized the importance of taking your inhalers as prescribed 2 puffs taken every 12 hours Reviewed with family that they also need to be supporting the patient to ensure that she is using her maintenance inhalers properly

## 2020-09-18 NOTE — Assessment & Plan Note (Signed)
Plan: Continue Breztri  Encourage patient to take inhaler as prescribed Lab work today Chest x-ray today Strongly encourage patient to consider obtaining COVID-19 vaccinations, patient education information provided for patient today from up-to-date as well as ATS

## 2020-09-18 NOTE — Assessment & Plan Note (Signed)
Patient with acute on chronic dyspnea This is likely related to the patient's physical deconditioning, nonadherence to her inhalers as well as suspected fluid retention Weight is up around 20 pounds over the last 6 months  Plan: Lab work today Encourage patient to resume inhaler use as prescribed Chest x-ray today

## 2020-09-19 LAB — PRO B NATRIURETIC PEPTIDE: NT-Pro BNP: 271 pg/mL (ref 0–738)

## 2020-10-04 ENCOUNTER — Encounter: Payer: Self-pay | Admitting: Physical Medicine and Rehabilitation

## 2020-10-04 ENCOUNTER — Ambulatory Visit (INDEPENDENT_AMBULATORY_CARE_PROVIDER_SITE_OTHER): Payer: Medicare Other | Admitting: Physical Medicine and Rehabilitation

## 2020-10-04 ENCOUNTER — Other Ambulatory Visit: Payer: Self-pay

## 2020-10-04 ENCOUNTER — Ambulatory Visit: Payer: Self-pay

## 2020-10-04 VITALS — BP 167/70 | HR 89

## 2020-10-04 DIAGNOSIS — M5416 Radiculopathy, lumbar region: Secondary | ICD-10-CM | POA: Diagnosis not present

## 2020-10-04 MED ORDER — BETAMETHASONE SOD PHOS & ACET 6 (3-3) MG/ML IJ SUSP
12.0000 mg | Freq: Once | INTRAMUSCULAR | Status: AC
  Administered 2020-10-04: 12 mg

## 2020-10-04 NOTE — Procedures (Signed)
Lumbar Epidural Steroid Injection - Interlaminar Approach with Fluoroscopic Guidance  Patient: Sheryl Curtis      Date of Birth: 1931/10/31 MRN: [id number] PCP: Alroy Dust, L.Marlou Sa, MD      Visit Date: 10/04/2020   Universal Protocol:     Consent Given By: the patient  Position: PRONE  Additional Comments: Vital signs were monitored before and after the procedure. Patient was prepped and draped in the usual sterile fashion. The correct patient, procedure, and site was verified.   Injection Procedure Details:   Procedure diagnoses: Lumbar radiculopathy [M54.16]   Meds Administered:  Meds ordered this encounter  Medications  . betamethasone acetate-betamethasone sodium phosphate (CELESTONE) injection 12 mg     Laterality: Right  Location/Site:  L5-S1  Needle: 3.5 in., 20 ga. Tuohy  Needle Placement: Paramedian epidural  Findings:   -Comments: Excellent flow of contrast into the epidural space.  Procedure Details: Using a paramedian approach from the side mentioned above, the region overlying the inferior lamina was localized under fluoroscopic visualization and the soft tissues overlying this structure were infiltrated with 4 ml. of 1% Lidocaine without Epinephrine. The Tuohy needle was inserted into the epidural space using a paramedian approach.   The epidural space was localized using loss of resistance along with counter oblique bi-planar fluoroscopic views.  After negative aspirate for air, blood, and CSF, a 2 ml. volume of Isovue-250 was injected into the epidural space and the flow of contrast was observed. Radiographs were obtained for documentation purposes.    The injectate was administered into the level noted above.   Additional Comments:  The patient tolerated the procedure well Dressing: 2 x 2 sterile gauze and Band-Aid    Post-procedure details: Patient was observed during the procedure. Post-procedure instructions were reviewed.  Patient left the  clinic in stable condition.

## 2020-10-04 NOTE — Progress Notes (Signed)
Sheryl Curtis - 86 y.o. female MRN [id number]  Date of birth: [birth year]  Office Visit Note: Visit Date: 10/04/2020 PCP: Alroy Dust, L.Marlou Sa, MD Referred by: Alroy Dust, L.Marlou Sa, MD  Subjective: Chief Complaint  Patient presents with  . Lower Back - Pain  . Right Leg - Pain   HPI:  Sheryl Curtis is a 86 y.o. female who comes in today at the request of Dr. Jean Rosenthal for planned Right L5-S1 Lumbar epidural steroid injection with fluoroscopic guidance.  The patient has failed conservative care including home exercise, medications, time and activity modification.  This injection will be diagnostic and hopefully therapeutic.  Please see requesting physician notes for further details and justification. MRI reviewed with images and spine model.  MRI reviewed in the note below.   Patient is getting radicular leg pain in the right thigh more of an L5 distribution.  Has a history of lumbar stenosis at L4-5 and facet arthropathy.  MRI is from several years ago and this may be something to look at in the future with repeat MRI or CT scan.  She is also followed from a chronic pain management standpoint by Leeroy Cha, MD with a history of multiple joint pain and fibromyalgia.  ROS Otherwise per HPI.  Assessment & Plan: Visit Diagnoses:    ICD-10-CM   1. Lumbar radiculopathy  M54.16 XR C-ARM NO REPORT    Epidural Steroid injection    betamethasone acetate-betamethasone sodium phosphate (CELESTONE) injection 12 mg    Plan: No additional findings.   Meds & Orders:  Meds ordered this encounter  Medications  . betamethasone acetate-betamethasone sodium phosphate (CELESTONE) injection 12 mg    Orders Placed This Encounter  Procedures  . XR C-ARM NO REPORT  . Epidural Steroid injection    Follow-up: Return if symptoms worsen or fail to improve.   Procedures: No procedures performed  Lumbar Epidural Steroid Injection - Interlaminar Approach with Fluoroscopic Guidance  Patient: Sheryl Curtis      Date of Birth: October 31, 1931 MRN: [id number] PCP: Alroy Dust, L.Marlou Sa, MD      Visit Date: 10/04/2020   Universal Protocol:     Consent Given By: the patient  Position: PRONE  Additional Comments: Vital signs were monitored before and after the procedure. Patient was prepped and draped in the usual sterile fashion. The correct patient, procedure, and site was verified.   Injection Procedure Details:   Procedure diagnoses: Lumbar radiculopathy [M54.16]   Meds Administered:  Meds ordered this encounter  Medications  . betamethasone acetate-betamethasone sodium phosphate (CELESTONE) injection 12 mg     Laterality: Right  Location/Site:  L5-S1  Needle: 3.5 in., 20 ga. Tuohy  Needle Placement: Paramedian epidural  Findings:   -Comments: Excellent flow of contrast into the epidural space.  Procedure Details: Using a paramedian approach from the side mentioned above, the region overlying the inferior lamina was localized under fluoroscopic visualization and the soft tissues overlying this structure were infiltrated with 4 ml. of 1% Lidocaine without Epinephrine. The Tuohy needle was inserted into the epidural space using a paramedian approach.   The epidural space was localized using loss of resistance along with counter oblique bi-planar fluoroscopic views.  After negative aspirate for air, blood, and CSF, a 2 ml. volume of Isovue-250 was injected into the epidural space and the flow of contrast was observed. Radiographs were obtained for documentation purposes.    The injectate was administered into the level noted above.   Additional Comments:  The patient tolerated  the procedure well Dressing: 2 x 2 sterile gauze and Band-Aid    Post-procedure details: Patient was observed during the procedure. Post-procedure instructions were reviewed.  Patient left the clinic in stable condition.     Clinical History: MRI CERVICAL SPINE WITHOUT  CONTRAST  TECHNIQUE: Multiplanar, multisequence MR imaging of the cervical spine was performed. No intravenous contrast was administered.  COMPARISON:  04/19/2020 cervical spine radiographs.  FINDINGS: Please note that motion artifact limits evaluation.  Alignment: Mild reversal of cervical lordosis. Minimal grade 1 C2-3 anterolisthesis. Stepwise grade 1 C5-6 and C6-7 retrolisthesis.  Vertebrae: Normal bone marrow signal intensity. No focal osseous lesion.  Cord: Normal signal and morphology.  Posterior Fossa, vertebral arteries: Negative.  Disc levels: Multilevel desiccation. Mild C3-4 and moderate C5-7 disc space loss.  C2-3: Small central protrusion with uncovertebral and bilateral facet hypertrophy. Mild left neural foraminal narrowing.  C3-4: Disc osteophyte complex with superimposed central protrusion abutting the ventral cord, uncovertebral and bilateral facet hypertrophy. Mild spinal canal and bilateral neural foraminal narrowing.  C4-5: Disc osteophyte complex with superimposed left paracentral protrusion abutting the ventral cord, left predominant uncovertebral and facet hypertrophy. Mild spinal canal and mild left greater than right neural foraminal narrowing.  C5-6: Disc osteophyte complex abutting the ventral cord with superimposed left subarticular/foraminal protrusion, uncovertebral and bilateral facet hypertrophy. Mild to moderate spinal canal and moderate bilateral neural foraminal narrowing.  C6-7: Disc osteophyte complex abutting the ventral cord with small superimposed left foraminal protrusion, uncovertebral and bilateral facet hypertrophy. Moderate spinal canal and moderate to severe bilateral neural foraminal narrowing.  C7-T1: Tiny central protrusion. No significant spinal canal or neural foraminal narrowing.  Paraspinal tissues: Within normal limits.  IMPRESSION: Moderate to severe bilateral C5-6 and C6-7 neural  foraminal narrowing.  Mild to moderate C5-6 and moderate C6-7 spinal canal narrowing.  Mild left C2-3 and bilateral C3-5 neural foraminal narrowing.   Electronically Signed   By: Primitivo Gauze M.D.   On: 05/18/2020 08:30 ---- MRI LUMBAR SPINE WITHOUT CONTRAST    COMPARISON: MRI lumbar spine July 01, 2015    FINDINGS:     VERTEBRAE:Vertebral bodies are intact. Similar severe L3-4, L4-5 and  L5-S1 disc height loss, associated with levoscoliosis. Severe T11-12  and T12-L1 disc height loss associated with dextroscoliosis.  Decreased T2 signal within all disc compatible with desiccation with  multilevel vacuum disc. Moderate lower lumbar subacute on chronic  discogenic endplate changes. No bone marrow signal abnormality to  suggest acute osseous process.     PARASPINAL AND SOFT TISSUES: Included prevertebral and paraspinal  soft tissues are nonacute. Moderate to severe symmetric paraspinal  muscle atrophy. Stable renal cysts measuring up to 15 mm on the  RIGHT.    DISC LEVELS:     L3-4: Small broad-based disc bulge, moderate RIGHT subarticular disc  protrusion. Mild facet arthropathy and ligamentum flavum redundancy.  No canal stenosis though partial chronic effacement RIGHT lateral  recess could affect the traversing RIGHT L4 nerve. Moderate to  severe RIGHT, mild LEFT neural foraminal narrowing.    L4-5: Stable moderate broad-based disc bulge asymmetric to the  RIGHT. Moderate RIGHT and mild LEFT facet arthropathy with  ligamentum flavum redundancy. No canal stenosis. Severe RIGHT,  moderate LEFT neural foraminal narrowing.    L5-S1: Moderate broad-based disc bulge. Mild to moderate facet  arthropathy and ligamentum flavum redundancy without canal stenosis.  Moderate to severe RIGHT, severe LEFT neural foraminal narrowing.    IMPRESSION:    No canal stenosis. Neural foraminal narrowing  at all lumbar levels:  Severe on the RIGHT at L4-5 and  severe on the LEFT at L5-S1.    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   By: Elon Alas M.D.  On: 08/22/2016 03:14     Objective:  VS:  HT:    WT:   BMI:     BP:(!) 167/70  HR:89bpm  TEMP: ( )  RESP:  Physical Exam Constitutional:      General: She is not in acute distress.    Appearance: Normal appearance. She is obese. She is not ill-appearing.  HENT:     Head: Normocephalic and atraumatic.     Right Ear: External ear normal.     Left Ear: External ear normal.  Eyes:     Extraocular Movements: Extraocular movements intact.  Cardiovascular:     Rate and Rhythm: Normal rate.     Pulses: Normal pulses.  Musculoskeletal:     Right lower leg: No edema.     Left lower leg: No edema.     Comments: Patient has good distal strength with no pain over the greater trochanters.  No clonus or focal weakness.  Skin:    Findings: No erythema, lesion or rash.  Neurological:     General: No focal deficit present.     Mental Status: She is alert and oriented to person, place, and time.     Sensory: No sensory deficit.     Motor: No weakness or abnormal muscle tone.     Coordination: Coordination normal.  Psychiatric:        Mood and Affect: Mood normal.        Behavior: Behavior normal.      Imaging: XR C-ARM NO REPORT  Result Date: 10/04/2020 Please see Notes tab for imaging impression.

## 2020-10-04 NOTE — Progress Notes (Signed)
Pt state lower back pain the travels down her right leg. Pt state walking and standing makes the pain worse. Pt state she has to sit, lay down and take pain meds at times to ease her pain.   Numeric Pain Rating Scale and Functional Assessment Average Pain 0   In the last MONTH (on 0-10 scale) has pain interfered with the following?  1. General activity like being  able to carry out your everyday physical activities such as walking, climbing stairs, carrying groceries, or moving a chair?  Rating(8)   +Driver, +BT, -Dye Allergies. Pt has been oof her BT for seven days.

## 2020-10-10 ENCOUNTER — Ambulatory Visit: Payer: Medicare Other | Admitting: Pulmonary Disease

## 2020-10-11 ENCOUNTER — Other Ambulatory Visit: Payer: Self-pay | Admitting: Physical Medicine and Rehabilitation

## 2020-10-11 MED ORDER — PREGABALIN 25 MG PO CAPS
25.0000 mg | ORAL_CAPSULE | Freq: Every day | ORAL | 0 refills | Status: DC
Start: 2020-10-11 — End: 2020-12-13

## 2020-10-16 ENCOUNTER — Other Ambulatory Visit: Payer: Self-pay | Admitting: *Deleted

## 2020-10-16 NOTE — Patient Outreach (Signed)
[facility] [place] [facility]) Care Management  Sheryl Curtis  10/16/2020   Sheryl Curtis January 29, 1932 [id number]  RN Health Coach telephone call to patient.  [employer] compliance verified. Patient is monitoring blood pressure and the readings are better.  Per patient he is doing good. Patient has not taken her the flu shot and Covid. She stated that she does not take them. Patient appetite is good and she is monitoring the sodium in her diet. She is requesting assistance with respiratory inhalers.  Patient has agreed to follow up outreach calls.   Encounter Medications:  Outpatient Encounter Medications as of 10/16/2020  Medication Sig Note  . acetaminophen (TYLENOL) 650 MG CR tablet Take 650 mg by mouth every 8 (eight) hours as needed for pain.    Marland Kitchen albuterol (PROVENTIL) (2.5 MG/3ML) 0.083% nebulizer solution USE 1 VIAL VIA NEBULIZER EVERY 6 HOURS AS NEEDED (Patient taking differently: Take 2.5 mg by nebulization in the morning and at bedtime. ) 01/27/2020: Patient states she takes everyday   . Albuterol Sulfate (PROAIR RESPICLICK) 563 (90 Base) MCG/ACT AEPB Inhale 1-2 puffs into the lungs every 6 (six) hours as needed (for shortness of breath and/or wheezing.). (Patient taking differently: Inhale 1 puff into the lungs in the morning and at bedtime. )   . ALPRAZolam (XANAX) 0.25 MG tablet Take 0.25 mg by mouth 3 (three) times daily as needed for anxiety.   Marland Kitchen amitriptyline (ELAVIL) 10 MG tablet Take 1 tablet (10 mg total) by mouth at bedtime as needed for sleep.   . Ascorbic Acid (VITAMIN C) 1000 MG tablet Take 1,000 mg by mouth daily.   . Budeson-Glycopyrrol-Formoterol (BREZTRI AEROSPHERE) 160-9-4.8 MCG/ACT AERO Inhale 2 puffs into the lungs in the morning and at bedtime.   . carbamide peroxide (DEBROX) 6.5 % OTIC solution Place 5 drops into both ears 2 (two) times daily.   . Cholecalciferol (VITAMIN D) 50 MCG (2000 UT) tablet Take 2,000 Units by mouth daily.   . clopidogrel (PLAVIX) 75 MG  tablet TAKE 1 TABLET(75 MG) BY MOUTH EVERY OTHER DAY (Patient taking differently: Take 75 mg by mouth daily. )   . diphenhydrAMINE (BENADRYL ALLERGY) 25 mg capsule Take 1 capsule (25 mg total) by mouth at bedtime as needed.   . docusate sodium (COLACE) 100 MG capsule Take 1 capsule (100 mg total) by mouth in the morning, at noon, and at bedtime.   . DULoxetine (CYMBALTA) 60 MG capsule Take 60 mg by mouth daily.   . furosemide (LASIX) 40 MG tablet TAKE 1 TABLET BY MOUTH  DAILY   . gabapentin (NEURONTIN) 300 MG capsule Take 1 capsule (300 mg total) by mouth at bedtime.   Marland Kitchen ipratropium (ATROVENT) 0.03 % nasal spray Place 2 sprays into both nostrils every 12 (twelve) hours.   . isosorbide mononitrate (IMDUR) 60 MG 24 hr tablet TAKE 1 TABLET BY MOUTH  DAILY   . Nebulizers (COMPRESSOR NEBULIZER) MISC 1 Device by Does not apply route once.   . nitroGLYCERIN (NITROSTAT) 0.4 MG SL tablet Place 1 tablet (0.4 mg total) under the tongue every 5 (five) minutes as needed for chest pain.   . Omega-3 Fatty Acids (FISH OIL) 1200 MG CAPS Take 1,200 mg by mouth daily.   Marland Kitchen oxyCODONE-acetaminophen (PERCOCET) 7.5-325 MG tablet Take 1 tablet by mouth 3 (three) times daily as needed for severe pain. 08/16/2020: Didn't bring bottle, LD 08/16/2020  . polyethylene glycol (MIRALAX / GLYCOLAX) 17 g packet Take 17 g by mouth daily as needed for  mild constipation.    . potassium chloride SA (KLOR-CON) 20 MEQ tablet TAKE 1 TABLET BY MOUTH  DAILY   . pregabalin (LYRICA) 25 MG capsule Take 1 capsule (25 mg total) by mouth daily.   Marland Kitchen Respiratory Therapy Supplies (FLUTTER) DEVI Use flutter device 3 times a day   . rosuvastatin (CRESTOR) 20 MG tablet Take 1 tablet (20 mg total) by mouth daily.   Marland Kitchen senna (SENOKOT) 8.6 MG TABS tablet Take 2 tablets (17.2 mg total) by mouth at bedtime.   . Tiotropium Bromide-Olodaterol (STIOLTO RESPIMAT) 2.5-2.5 MCG/ACT AERS Inhale 2 puffs into the lungs daily.   . valsartan-hydrochlorothiazide  (DIOVAN-HCT) 160-12.5 MG tablet Take 1 tablet by mouth daily.    No facility-administered encounter medications on file as of 10/16/2020.    Functional Status:  In your present state of health, do you have any difficulty performing the following activities: 10/25/2019  Hearing? Y  Vision? N  Difficulty concentrating or making decisions? N  Walking or climbing stairs? Y  Comment due to arthritis and pain  Dressing or bathing? N  Doing errands, shopping? Y  Comment daughter [name] runs all Science writer and eating ? N  Using the Toilet? N  In the past six months, have you accidently leaked urine? Y  Do you have problems with loss of bowel control? N  Managing your Medications? N  Managing your Finances? N  Housekeeping or managing your Housekeeping? Y  Comment [name] helps out  Some recent data might be hidden    Fall/Depression Screening: Fall Risk  10/16/2020 09/13/2020 07/18/2020  Falls in the past year? '1 1 1  ' Comment - - -  Number falls in past yr: 1 0 1  Injury with Fall? '1 1 1  ' Comment - bruised ribs -  Risk Factor Category  - - -  Risk for fall due to : History of fall(s);Impaired balance/gait;Impaired mobility - History of fall(s);Impaired balance/gait;Impaired mobility  Risk for fall due to: Comment - - -  Follow up Falls evaluation completed;Falls prevention discussed - Falls evaluation completed;Falls prevention discussed  Comment - - -   PHQ 2/9 Scores 05/03/2020 03/07/2020 01/21/2020 10/25/2019 07/22/2019 04/21/2019 01/13/2019  PHQ - 2 Score 0 0 2 0 0 0 2  PHQ- 9 Score - - 5 - - - 6  Exception Documentation - - - - - - Patient refusal    Assessment:  Goals Addressed            This Visit's Progress   . (THN)Track and Manage My Blood Pressure-Hypertension       Timeframe:  Short-Term Goal Priority:  High Start Date:12202021                             Expected End Date:  [id number]                     Follow Up Date [id number]   - check blood  pressure weekly - choose a place to take my blood pressure (home, clinic or office, retail store) - write blood pressure results in a log or diary    Why is this important?    You won't feel high blood pressure, but it can still hurt your blood vessels.   High blood pressure can cause heart or kidney problems. It can also cause a stroke.   Making lifestyle changes like losing a little weight or eating less salt will  help.   Checking your blood pressure at home and at different times of the day can help to control blood pressure.   If the doctor prescribes medicine remember to take it the way the doctor ordered.   Call the office if you cannot afford the medicine or if there are questions about it.     Notes:  RN sent new calendar book for documentation    . Blood Pressure < 140/90       CARE PLAN ENTRY (see longitudinal plan of care for additional care plan information)  Objective:  . Last practice recorded BP readings:  BP Readings from Last 3 Encounters:  06/14/20 (!) 112/53  05/03/20 (!) 126/56  04/05/20 (!) 94/55 .   Marland Kitchen Most recent eGFR/CrCl: No results found for: EGFR  No components found for: CRCL  Current Barriers:  Marland Kitchen Knowledge Deficits related to basic understanding of hypertension diagnosis . Knowledge deficit related to self care management of hypertension . Knowledge deficit related to dangers of uncontrolled hypertension  Case Manager Clinical Goal(s):  Marland Kitchen Over the next 90 days, patient will verbalize understanding of plan for hypertension management . Over the next 90 days, patient will attend all scheduled medical appointments: PCP,eye exam . Over the next 90 days, patient will demonstrate improved adherence to prescribed treatment plan for hypertension as evidenced by taking all medications as prescribed, monitoring and recording blood pressure as directed, adhering to low sodium/DASH diet . Over the next 90 days, patient will demonstrate improved health  management independence as evidenced by checking blood pressure as directed and notifying PCP if SBP>190 or DBP > 110, taking all medications as prescribe, and adhering to a low sodium diet as discussed. . Over the next 90 days, patient will verbalize basic understanding of hypertension disease process and self health management plan as evidenced by blood pressure 140/90 or less  Interventions:  . Evaluation of current treatment plan related to hypertension self management and patient's adherence to plan as established by provider. . Reviewed medications with patient and discussed importance of compliance . Discussed plans with patient for ongoing care management follow up and provided patient with direct contact information for care management team . Advised patient, providing education and rationale, to monitor blood pressure daily and record, calling PCP for findings outside established parameters.  . Provided education regarding s/s of stroke and stroke prevention . Provided education regarding s/s of heart attack . Provided education regarding s/s of DASH diet/Low salt diet . Provided education regarding complications of uncontrolled blood pressure . Provided educational material: Matter of Choice Blood Pressure Control . Provided educational material:  EMMI (Weight Loss Tips, Weight Loss Diet, Chair Exercises, Relaxation Techniques, Low Sodium Diet, DASH Diet, Controlling your Blood Pressure through Lifestyle, High Blood Pressure Health Problems, High Blood Pressure Taking Your Blood Pressure, Lowering Your Rick of High Blood Pressure, Stress, etc)  Patient Self Care Activities:  . Attends all scheduled provider appointments . Calls provider office for new concerns, questions, or BP outside discussed parameters . Monitors BP and records as discussed . Adheres to a low sodium diet/DASH diet . Verbalize where to go to receive medical care Patient is making good progress with blood pressure  readings    . Lifestyle Change-Hypertension       Timeframe:  Long-Range Goal Priority:  Medium Start Date:   [id number]  Expected End Date:   [id number]                    Follow Up Date [id number]   - agree to work together to make changes - ask questions to understand - learn about high blood pressure    Why is this important?    The changes that you are asked to make may be hard to do.   This is especially true when the changes are life-long.   Knowing why it is important to you is the first step.   Working on the change with your family or support person helps you not feel alone.   Reward yourself and family or support person when goals are met. This can be an activity you choose like bowling, hiking, biking, swimming or shooting hoops.     Notes:         Plan:  Follow-up:  Patient agrees to Care Plan and Follow-up. RN provided a calendar book for documentation of BP Referred to Pharmacy Patient will continue to monitor blood pressures Patient will continue monitoring sodium in diet RN will follow up within the month of March RN sent update assessment to PCP  [facility] [phone]

## 2020-10-16 NOTE — Patient Outreach (Signed)
[name] Care Management  10/16/2020  Sheryl Curtis Aug 14, 1932 [id number]   RN Health Coach attempted follow up outreach call to patient.  Patient was unavailable. HIPPA compliance voicemail message left with return callback number.  Plan: RN will call patient again within 30 days.  [facility] Management [phone]

## 2020-10-18 NOTE — Patient Outreach (Signed)
[place] [facility]) Care Management  10/18/2020  TATANISHA CUTHBERT Mar 13, 1932 [id number]  Good Afternoon,  Referral request for medication assistance sent to [facility] per Johny Shock, RN.  Thank you.  Ina Homes [facility] Management Assistant [phone]

## 2020-10-19 NOTE — Patient Instructions (Signed)
Goals Addressed            This Visit's Progress   . (THN)Track and Manage My Blood Pressure-Hypertension       Timeframe:  Short-Term Goal Priority:  High Start Date:12202021                             Expected End Date:  [id number]                     Follow Up Date [id number]   - check blood pressure weekly - choose a place to take my blood pressure (home, clinic or office, retail store) - write blood pressure results in a log or diary    Why is this important?    You won't feel high blood pressure, but it can still hurt your blood vessels.   High blood pressure can cause heart or kidney problems. It can also cause a stroke.   Making lifestyle changes like losing a little weight or eating less salt will help.   Checking your blood pressure at home and at different times of the day can help to control blood pressure.   If the doctor prescribes medicine remember to take it the way the doctor ordered.   Call the office if you cannot afford the medicine or if there are questions about it.     Notes:  RN sent new calendar book for documentation    . Blood Pressure < 140/90       CARE PLAN ENTRY (see longitudinal plan of care for additional care plan information)  Objective:  . Last practice recorded BP readings:  BP Readings from Last 3 Encounters:  06/14/20 (!) 112/53  05/03/20 (!) 126/56  04/05/20 (!) 94/55 .   Marland Kitchen Most recent eGFR/CrCl: No results found for: EGFR  No components found for: CRCL  Current Barriers:  Marland Kitchen Knowledge Deficits related to basic understanding of hypertension diagnosis . Knowledge deficit related to self care management of hypertension . Knowledge deficit related to dangers of uncontrolled hypertension  Case Manager Clinical Goal(s):  Marland Kitchen Over the next 90 days, patient will verbalize understanding of plan for hypertension management . Over the next 90 days, patient will attend all scheduled medical appointments: PCP,eye exam . Over the next 90  days, patient will demonstrate improved adherence to prescribed treatment plan for hypertension as evidenced by taking all medications as prescribed, monitoring and recording blood pressure as directed, adhering to low sodium/DASH diet . Over the next 90 days, patient will demonstrate improved health management independence as evidenced by checking blood pressure as directed and notifying PCP if SBP>190 or DBP > 110, taking all medications as prescribe, and adhering to a low sodium diet as discussed. . Over the next 90 days, patient will verbalize basic understanding of hypertension disease process and self health management plan as evidenced by blood pressure 140/90 or less  Interventions:  . Evaluation of current treatment plan related to hypertension self management and patient's adherence to plan as established by provider. . Reviewed medications with patient and discussed importance of compliance . Discussed plans with patient for ongoing care management follow up and provided patient with direct contact information for care management team . Advised patient, providing education and rationale, to monitor blood pressure daily and record, calling PCP for findings outside established parameters.  . Provided education regarding s/s of stroke and stroke prevention . Provided education regarding s/s of heart  attack . Provided education regarding s/s of DASH diet/Low salt diet . Provided education regarding complications of uncontrolled blood pressure . Provided educational material: Matter of Choice Blood Pressure Control . Provided educational material:  EMMI (Weight Loss Tips, Weight Loss Diet, Chair Exercises, Relaxation Techniques, Low Sodium Diet, DASH Diet, Controlling your Blood Pressure through Lifestyle, High Blood Pressure Health Problems, High Blood Pressure Taking Your Blood Pressure, Lowering Your Rick of High Blood Pressure, Stress, etc)  Patient Self Care Activities:  . Attends all  scheduled provider appointments . Calls provider office for new concerns, questions, or BP outside discussed parameters . Monitors BP and records as discussed . Adheres to a low sodium diet/DASH diet . Verbalize where to go to receive medical care Patient is making good progress with blood pressure readings    . Lifestyle Change-Hypertension       Timeframe:  Long-Range Goal Priority:  Medium Start Date:   [id number]                          Expected End Date:   [id number]                    Follow Up Date [id number]   - agree to work together to make changes - ask questions to understand - learn about high blood pressure    Why is this important?    The changes that you are asked to make may be hard to do.   This is especially true when the changes are life-long.   Knowing why it is important to you is the first step.   Working on the change with your family or support person helps you not feel alone.   Reward yourself and family or support person when goals are met. This can be an activity you choose like bowling, hiking, biking, swimming or shooting hoops.     Notes:

## 2020-10-24 ENCOUNTER — Other Ambulatory Visit: Payer: Self-pay | Admitting: Physical Medicine and Rehabilitation

## 2020-10-24 MED ORDER — OXYCODONE-ACETAMINOPHEN 7.5-325 MG PO TABS: 1.0000 | ORAL_TABLET | Freq: Two times a day (BID) | ORAL | 0 refills | Status: DC | PRN

## 2020-10-31 ENCOUNTER — Telehealth: Payer: Self-pay | Admitting: Pulmonary Disease

## 2020-10-31 ENCOUNTER — Ambulatory Visit: Payer: Medicare Other | Admitting: Pulmonary Disease

## 2020-10-31 ENCOUNTER — Other Ambulatory Visit: Payer: Self-pay

## 2020-10-31 ENCOUNTER — Encounter: Payer: Self-pay | Admitting: Pulmonary Disease

## 2020-10-31 DIAGNOSIS — J449 Chronic obstructive pulmonary disease, unspecified: Secondary | ICD-10-CM

## 2020-10-31 DIAGNOSIS — R0602 Shortness of breath: Secondary | ICD-10-CM | POA: Diagnosis not present

## 2020-10-31 MED ORDER — PROAIR RESPICLICK 108 (90 BASE) MCG/ACT IN AEPB
1.0000 | INHALATION_SPRAY | Freq: Four times a day (QID) | RESPIRATORY_TRACT | 6 refills | Status: AC | PRN
Start: 2020-10-31 — End: ?

## 2020-10-31 MED ORDER — BREZTRI AEROSPHERE 160-9-4.8 MCG/ACT IN AERO: 2.0000 | INHALATION_SPRAY | Freq: Two times a day (BID) | RESPIRATORY_TRACT | 0 refills | Status: DC

## 2020-10-31 NOTE — Patient Instructions (Signed)
Ambulatory saturation. Schedule high-resolution CT of the chest. Increase Lasix to 1 tablet / 40 mg daily for 2 weeks -call me back to report weight after 2 weeks

## 2020-10-31 NOTE — Assessment & Plan Note (Signed)
No single cause of NYHA class III dyspnea identified on today's exam.  She does not have significant bronchospasm.  She does have bibasilar crackles test last 2018 did not show any evidence of ILD.  We will pursue high-resolution CT chest to clarify She does have a 10 pound weight gain but no evidence of diastolic dysfunction on prior echo.  I have asked her to increase Lasix to 40 mg once daily and see if we can achieve weight loss of 6 to 10 pounds and see if this improves her dyspnea. She does not have any bronchospasm today so I do not think she is having asthma exacerbation Previously dyspnea has been attributed to deconditioning

## 2020-10-31 NOTE — Telephone Encounter (Signed)
Spoke with daughter Kathie Rhodes, states that today RA advised pt to take 1 full tab of Lasix qd X2 weeks.  Daughter states that she already takes 1 full tab (40mg ) daily.  States that the only med that she doesn't take daily is her plavix, which she takes 1 tab every other day.    Daughter wants to make RA aware, see if he has any additional recommendations.  RA please advise, thanks!

## 2020-10-31 NOTE — Progress Notes (Signed)
   Subjective:    Patient ID: Sheryl Curtis, female    DOB: [birth year], 87 y.o.   MRN: [id number]  HPI  27 yoremote smoker with severe RA,for FU ofairwayObstruction& OSA She has severe rheumatoid arthritis with previous MTX useand fibromyalgia CT chest has shown multiple pulmonary nodules that have been stable,last 2018  PMH -rectal cancer status post resection in 2007,stent to LAD in 04/2013.on Lasix daily for bipedal edema.  Last seen by me in 2020, she has seen app Covid infection 11/2019 but remains unvaccinated She did not tolerate CPAP and this was discontinued in 2020. She was maintained on Symbicort and Stiolto previously but this was changed to [place] sometime in 2021  She complains of increased shortness of breath for the past few months, weight has increased from 210 on last visit 08/2019 1-2 1 8  pounds. She complains of increased pedal edema, takes half tablet of 40 mg of Lasix daily.  She is class III dyspnea on usual activities. Denies wheezing or coughing or URI symptoms  Labs 08/2020 BUN/creatinine 25/1.4, BNP 271 She was only able to ambulate 1 lap but did not desaturate all the heart rate stayed high 1 teens suggesting deconditioning  Significant tests/ events reviewed  CTchest 03/2017 -stable multiple pulmonary nodules since 2015  Spirometry in 05/2010 showed FEV1 of 76% improvement postbronchodilator to 80%-1.60 with a ratio of 75 suggesting mild restriction. Spirometry 03/2014 FEV1 of 1.25-64%, with ratio 65 and FVC of 1.91-72%.  PFTs - 12/2014 - no airway obstruction, ratio 71, pos BD response PFTs 11/2018, and mild airway obstruction with FEV1 79%, TLC 101%, DLCO normal, no bronchodilator response  06/2015 Admit for UTI and Sepsis   NPSG >>AHI 10/h corrected by CPAP 10 cm  Review of Systems neg for any significant sore throat, dysphagia, itching, sneezing, nasal congestion or excess/ purulent secretions, fever, chills, sweats, unintended wt  loss, pleuritic or exertional cp, hempoptysis, orthopnea pnd or change in chronic leg swelling. Also denies presyncope, palpitations, heartburn, abdominal pain, nausea, vomiting, diarrhea or change in bowel or urinary habits, dysuria,hematuria, rash, arthralgias, visual complaints, headache, numbness weakness or ataxia.     Objective:   Physical Exam  Gen. Pleasant, obese, in no distress ENT - no lesions, no post nasal drip Neck: No JVD, no thyromegaly, no carotid bruits Lungs: no use of accessory muscles, no dullness to percussion, bibasal dry crackles, no rhonchi Cardiovascular: Rhythm regular, heart sounds  normal, no murmurs or gallops, no peripheral edema Musculoskeletal: No deformities, no cyanosis or clubbing , no tremors       Assessment & Plan:

## 2020-10-31 NOTE — Telephone Encounter (Signed)
Spoke with daughter Kathie Rhodes, states that pt takes 1/2 tab of Lasix daily after all, so advised pt to stick to original recs of 1 full tab daily X2 weeks, then call us to report weight.  Nothing further needed at this time- will close encounter.

## 2020-10-31 NOTE — Assessment & Plan Note (Signed)
She has been maintained on Breztri, no significant airway obstruction noted on last PFTs She also takes albuterol as needed for rescue, no significant bronchospasm today

## 2020-10-31 NOTE — Telephone Encounter (Signed)
lmtcb for pt's daughter (dpr on file)

## 2020-10-31 NOTE — Telephone Encounter (Signed)
Take 1.5 tablets/60 mg for 3 days then back down to 1 tablet daily

## 2020-11-01 ENCOUNTER — Encounter: Payer: Self-pay | Admitting: Physical Medicine and Rehabilitation

## 2020-11-01 ENCOUNTER — Encounter
Payer: Medicare Other | Attending: Physical Medicine and Rehabilitation | Admitting: Physical Medicine and Rehabilitation

## 2020-11-01 VITALS — BP 148/65 | HR 81 | Temp 98.5°F | Ht 64.0 in | Wt 220.2 lb

## 2020-11-01 DIAGNOSIS — G894 Chronic pain syndrome: Secondary | ICD-10-CM | POA: Diagnosis not present

## 2020-11-01 DIAGNOSIS — Z79891 Long term (current) use of opiate analgesic: Secondary | ICD-10-CM | POA: Diagnosis not present

## 2020-11-01 DIAGNOSIS — Z5181 Encounter for therapeutic drug level monitoring: Secondary | ICD-10-CM

## 2020-11-01 MED ORDER — OXYCODONE-ACETAMINOPHEN 10-325 MG PO TABS: 1.0000 | ORAL_TABLET | Freq: Two times a day (BID) | ORAL | 0 refills | Status: DC | PRN

## 2020-11-01 NOTE — Progress Notes (Signed)
Subjective:    Patient ID: Sheryl Curtis, female    DOB: [birth year], 87 y.o.   MRN: [id number]   HPI  Sheryl Curtis is an 87 year old woman who presents for follow-up of her fibromyalgia, left shoulder pain, lumbar and cervical facet arthropathy, opioid induced constipation, and with new right sided greater trochanteric pain syndrome and right hip OA.  1) Lumbar facet arthritis: Pan has been severe. It is currently worse in her right lumbar spine and radiates into her right leg. Her pain is worse with extension.   2) Quality of life: Quality of life has been poor. Pain really limited her function over Christmas. She asks whether Percocet can be increased. She is currently  87) Opioid induced constipation: She usually has a daily BM but sometimes can get constipated. She likess prune. She takes Colace or Miralax once in a while.   4) Hx of TIA: No more TIA symptoms. Memory has never been the same since her TIA.   Does sometimes have difficulty breathing. She was told she may need a fluid pill.    Prior history: She did not have benefit from the trigger point injections last visit.   Her right hip pain continues to be severe. She has been unable to ambulate much due to this pain.   She was started on Plavix after her TIA.   She has a follow-up appointment with Dr. Ernestina Patches on December 8th. Her cervical pain continues to be severe and she benefited from injections in the past.  Prior history:  Currently her chief complaint is her right sided hip pain. She recently had a steroid injection that gave her no relief. Discussed lidocaine/normal saline trigger point injections and she elected to try these today.  Her pain has been getting unbearable. She stopped getting relief from the oxycodone. Discussed restarting Lyrica which helped her at lower doses. Higher dose caused her to have the shakes and made her go to ED.   Other medications she has failed include: Tramadol, Tylenol, Percocet,  NSAIDs, Cymbalta, Amitriptyline, Tylenol with codeine, Skelaxin, Flexeril, and Tizanidine.   She does not take NSAIDs due to her cardiac history.   Prior history: Sheryl Curtis presentsfor follow-up of her fibromyalgia, chronic left shoulder pain,andcervical spondylosis. She has pain in her bilateral shoulders, neck, bilateral hips, knees, ankles, and low back. She has been diagnosed with fibromyalgia, osteoarthritis, and rheumatoid arthritis.Previously I started her on Percocet after she has failed or refused most other medications (Lyrica, Tramadol, Tylenol, NSAIDs, Cymbalta, Amitriptyline, Tylenol with codeine, Skelaxin, Flexeril, Tizanidine), and she has noted improvement in her quality of life. She has been going outside more and has been enjoying her new puppy.She has been taking about 2 Percocets per day. She has noted increasing constipation. She has been taking Miralax as needed. She has a history of hemorrhoids.   She continues to have neck pain especially when turning her neck to the left side. This pain has prevented her from driving and is currently her most severe source of pain. She had cervical facet joint blocks by Dr. Asa Lente at the [facility] without benefit. I have personally reviewed these notes and they were done on the left side at levels C2-C4. She follows with Dr. Lucille Passy her low back pain and has plan for lumbar facet joint radiofrequency ablation after insurance authorization, scheduled in August.   She has been sleeping poorly over the last few days and pain levels have also increased. She lives with  her daughter. She has a bedside commode for urination at night.   She would like to lose weight. Current weight is 209 lbs and she would like to get down to 185 lbs. She starts eating breakfast at 1pm and eats dinner at 5:30pm. She like to eat toast with eggs and tomato for breakfast. She likes to eat 6 hershey kisses as a snack but does not snack much  beyond this.   Pain Inventory Average Pain 6 Pain Right Now 8 My pain is constant and aching  In the last 24 hours, has pain interfered with the following? General activity 2 Relation with others 2 Enjoyment of life 2 What TIME of day is your pain at its worst? all the time Sleep (in general) Poor  Pain is worse with: walking, bending and standing Pain improves with: rest and medication Relief from Meds: 6    Family History  Problem Relation Age of Onset  . CAD Brother   . Heart attack Brother 42  . Bone cancer Sister   . Throat cancer Daughter    Social History   Socioeconomic History  . Marital status: Widowed    Spouse name: Not on file  . Number of children: 4  . Years of education: Not on file  . Highest education level: Not on file  Occupational History  . Not on file  Tobacco Use  . Smoking status: Former Smoker    Packs/day: 1.00    Years: 12.00    Pack years: 12.00    Types: Cigarettes    Start date: 10/28/1949    Quit date: 10/28/1962    Years since quitting: 58.0  . Smokeless tobacco: Never Used  Vaping Use  . Vaping Use: Never used  Substance and Sexual Activity  . Alcohol use: No  . Drug use: No  . Sexual activity: Not on file  Other Topics Concern  . Not on file  Social History Narrative  . Not on file   Social Determinants of Health   Financial Resource Strain: Not on file  Food Insecurity: No Food Insecurity  . Worried About Programme researcher, broadcasting/film/video in the Last Year: Never true  . Ran Out of Food in the Last Year: Never true  Transportation Needs: No Transportation Needs  . Lack of Transportation (Medical): No  . Lack of Transportation (Non-Medical): No  Physical Activity: Not on file  Stress: Not on file  Social Connections: Not on file   Past Surgical History:  Procedure Laterality Date  . ABDOMINAL HYSTERECTOMY    . ANKLE FRACTURE SURGERY    . BACK SURGERY    . BREAST SURGERY    . COLON SURGERY    . CORONARY STENT PLACEMENT    .  PERCUTANEOUS CORONARY STENT INTERVENTION (PCI-S) N/A 05/19/2013   Procedure: PERCUTANEOUS CORONARY STENT INTERVENTION (PCI-S);  Surgeon: Corky Crafts, MD;  Location: [facility] CATH LAB;  Service: Cardiovascular;  Laterality: N/A;   Past Medical History:  Diagnosis Date  . ALLERGIC RHINITIS   . Atrial fibrillation (HCC)   . CANCER, COLORECTAL   . CHEST PAIN, ATYPICAL   . CKD (chronic kidney disease)   . DYSPNEA   . Essential hypertension, benign   . FIBROMYALGIA   . G E R D   . History of stress test    Myoview 8/16:  EF 69%, anterior and apical defect c/w breast attenuation; Low Risk   . INSOMNIA   . Morbid obesity (HCC)   . Other and  unspecified angina pectoris   . Sleep apnea    There were no vitals taken for this visit.  Opioid Risk Score:   Fall Risk Score:  `1  Depression screen PHQ 2/9  Depression screen [facility] 2/9 05/03/2020 03/07/2020 01/21/2020 10/25/2019 07/22/2019 04/21/2019 01/13/2019  Decreased Interest 0 0 - 0 0 0 1  Down, Depressed, Hopeless 0 0 2 0 0 0 1  PHQ - 2 Score 0 0 2 0 0 0 2  Altered sleeping - - 1 - - - 1  Tired, decreased energy - - 0 - - - 1  Change in appetite - - 0 - - - 0  Feeling bad or failure about yourself  - - 1 - - - 1  Trouble concentrating - - 1 - - - 1  Moving slowly or fidgety/restless - - 0 - - - 0  Suicidal thoughts - - 0 - - - 0  PHQ-9 Score - - 5 - - - 6  Difficult doing work/chores - - Somewhat difficult - - - Somewhat difficult  Some recent data might be hidden   Review of Systems  Constitutional: Positive for appetite change.       Taste is back   HENT: Negative.   Eyes: Negative.   Respiratory: Positive for shortness of breath and wheezing.   Cardiovascular: Positive for chest pain.  Gastrointestinal: Positive for constipation.  Endocrine: Negative.   Genitourinary: Negative.   Musculoskeletal: Positive for arthralgias, back pain, gait problem, myalgias and neck pain.  Skin: Negative.   Allergic/Immunologic: Negative.    Neurological: Positive for weakness and numbness.       Tingling & spasms  Hematological: Bruises/bleeds easily.  Psychiatric/Behavioral: Negative.        Depression, anxiety       Objective:   Physical Exam Gen: no distress, normal appearing HEENT: oral mucosa pink and moist, NCAT Cardio: Reg rate Chest: normal effort, normal rate of breathing Abd: soft, non-distended Ext: no edema Skin: intact Psych: pleasant, normal affect Cervical ROM very limited in all planes. Tenderness to palpation over lateral cervical facet joints bilaterally In wheelchair Right sided positive slump test for pain in L5 nerve distribution. Tenderness to palpation over right greater trochanteric bursa Gait: Able to stand, right sided lower back pain is exacerbated by extension. Antalgic gait with RW Psych: pleasant, normal affect      Assessment & Plan:  Sheryl Curtis presents with diffuse pains and lumbar facet arthropathy.  She has pains in her bilateral shoulders, neck, bilateral hips, knees, ankles, and low back.  1) Fibromyalgia: -previously discontinued Lyrica as she had developed tremor at high dose. Can consider starting low dose again if she is agreeable as this did help initially. Discussed that if she develops respiratory side effects with Percocet, which should discontinue Percocet and instead try to increase Lyrica dosage again.  --Had negative side effects in response to Skelaxin, Tramadol, and Tylenol with Codeine, Percocet, but prefers to continue Percocet and asks if strength may be increased. Discussed that this could worsen her constipation and that it is common to get tolerant to opioids. Change Percocet script to 10mg  BID PRN- sometimes she uses once or not at all. She only takes when very severe.   2) Chronic left shoulder pain:I have personally reviewed XR; within normal limits.Can consider steroid injection in the future. Limits her ability to quilt.   3)Lumbar facet  arthropathy: Followed with Dr. Ernestina Patches for radiofrequency ablation in August. Unfortunately she did not  get significant benefit.   4) Recent [id number] medications. She is on statin and Plavix. BP continues to be much better controlled this visit! Advised that systolic BP is slightly elevated.   5) Quality of Life: Set back over the holidays due to pain. Encourage outdoor mobility and fresh air. Has strong social support of 3 daughters, son, and now new dog which gives her much joy.  7) Obesity 220 lbs: Provided dietary counseling. Up 3 pounds since last visit.   8) Cervical facet arthritis, worse on left side, now contributing to cervicogenic headaches. Can consider trigger point injections.   7) Insomnia: prescribed Amitriptyline 10mg  HS previously. Advised regarding cardiac side effects and risk of falls but patient prefers to try medication prior to clearance from cardiologist. Advised her daughter to monitor her first night to decrease risk of fall as she sometimes rises to use bedside commode at night.   8) Right sided greater trochanteric pain syndrome/OA: she did not get great benefit from steroid injection.   9) Opioid induced constipation: -6 prunes daily. -If does not help, mix Miralax in warm prune juice -If does not help, start Colace TID -If does not help, start Senna 2 tabs at night -Let me know if this does not help -Recommended high fiber diet and mobility as much as tolerated -Stay well hydrated -Goal is to have daily BM.   10) Shortness of breath: She has followed with Pulmonology and has imaging scheduled. Reviewed prior Echo and has EF of 65-70%. Does have some lower extremity edema. Was advised to start Lasix but Cr is elevated in November labs. Advised continued mobility as much as possible.

## 2020-11-01 NOTE — Patient Instructions (Addendum)
Colace morning 2 senna night Goal is to have a BM   OR 6 prunes every morning.   Blue emu oil

## 2020-11-03 DIAGNOSIS — H579 Unspecified disorder of eye and adnexa: Secondary | ICD-10-CM | POA: Diagnosis not present

## 2020-11-03 DIAGNOSIS — Z20822 Contact with and (suspected) exposure to covid-19: Secondary | ICD-10-CM | POA: Diagnosis not present

## 2020-11-03 DIAGNOSIS — R55 Syncope and collapse: Secondary | ICD-10-CM | POA: Diagnosis not present

## 2020-11-03 DIAGNOSIS — I2510 Atherosclerotic heart disease of native coronary artery without angina pectoris: Secondary | ICD-10-CM | POA: Diagnosis not present

## 2020-11-03 DIAGNOSIS — I130 Hypertensive heart and chronic kidney disease with heart failure and stage 1 through stage 4 chronic kidney disease, or unspecified chronic kidney disease: Secondary | ICD-10-CM | POA: Diagnosis not present

## 2020-11-03 DIAGNOSIS — I517 Cardiomegaly: Secondary | ICD-10-CM | POA: Diagnosis not present

## 2020-11-03 DIAGNOSIS — R918 Other nonspecific abnormal finding of lung field: Secondary | ICD-10-CM | POA: Diagnosis not present

## 2020-11-03 DIAGNOSIS — I4891 Unspecified atrial fibrillation: Secondary | ICD-10-CM | POA: Diagnosis not present

## 2020-11-03 DIAGNOSIS — Z886 Allergy status to analgesic agent status: Secondary | ICD-10-CM | POA: Diagnosis not present

## 2020-11-03 DIAGNOSIS — M549 Dorsalgia, unspecified: Secondary | ICD-10-CM | POA: Diagnosis not present

## 2020-11-03 DIAGNOSIS — I951 Orthostatic hypotension: Secondary | ICD-10-CM | POA: Diagnosis not present

## 2020-11-03 DIAGNOSIS — H547 Unspecified visual loss: Secondary | ICD-10-CM | POA: Diagnosis not present

## 2020-11-03 DIAGNOSIS — G4733 Obstructive sleep apnea (adult) (pediatric): Secondary | ICD-10-CM | POA: Diagnosis not present

## 2020-11-03 DIAGNOSIS — E785 Hyperlipidemia, unspecified: Secondary | ICD-10-CM | POA: Diagnosis not present

## 2020-11-03 DIAGNOSIS — G8929 Other chronic pain: Secondary | ICD-10-CM | POA: Diagnosis not present

## 2020-11-03 DIAGNOSIS — Z743 Need for continuous supervision: Secondary | ICD-10-CM | POA: Diagnosis not present

## 2020-11-03 DIAGNOSIS — G459 Transient cerebral ischemic attack, unspecified: Secondary | ICD-10-CM | POA: Diagnosis not present

## 2020-11-03 DIAGNOSIS — R6889 Other general symptoms and signs: Secondary | ICD-10-CM | POA: Diagnosis not present

## 2020-11-03 DIAGNOSIS — Z7951 Long term (current) use of inhaled steroids: Secondary | ICD-10-CM | POA: Diagnosis not present

## 2020-11-03 DIAGNOSIS — I672 Cerebral atherosclerosis: Secondary | ICD-10-CM | POA: Diagnosis not present

## 2020-11-03 DIAGNOSIS — Z79899 Other long term (current) drug therapy: Secondary | ICD-10-CM | POA: Diagnosis not present

## 2020-11-03 DIAGNOSIS — H53123 Transient visual loss, bilateral: Secondary | ICD-10-CM | POA: Diagnosis not present

## 2020-11-03 DIAGNOSIS — I509 Heart failure, unspecified: Secondary | ICD-10-CM | POA: Diagnosis not present

## 2020-11-03 DIAGNOSIS — Z8673 Personal history of transient ischemic attack (TIA), and cerebral infarction without residual deficits: Secondary | ICD-10-CM | POA: Diagnosis not present

## 2020-11-03 DIAGNOSIS — N189 Chronic kidney disease, unspecified: Secondary | ICD-10-CM | POA: Diagnosis not present

## 2020-11-03 DIAGNOSIS — Z87891 Personal history of nicotine dependence: Secondary | ICD-10-CM | POA: Diagnosis not present

## 2020-11-05 DIAGNOSIS — H547 Unspecified visual loss: Secondary | ICD-10-CM | POA: Diagnosis not present

## 2020-11-05 DIAGNOSIS — I951 Orthostatic hypotension: Secondary | ICD-10-CM | POA: Diagnosis not present

## 2020-11-07 LAB — DRUG TOX MONITOR 1 W/CONF, ORAL FLD
Alprazolam: 0.53 ng/mL — ABNORMAL HIGH (ref <0.50)
Amphetamines: NEGATIVE ng/mL (ref <10)
Barbiturates: NEGATIVE ng/mL (ref <10)
Benzodiazepines: POSITIVE ng/mL — AB (ref <0.50)
Buprenorphine: NEGATIVE ng/mL (ref <0.10)
Chlordiazepoxide: NEGATIVE ng/mL (ref <0.50)
Clonazepam: NEGATIVE ng/mL (ref <0.50)
Cocaine: NEGATIVE ng/mL (ref <5.0)
Codeine: NEGATIVE ng/mL (ref <2.5)
Diazepam: NEGATIVE ng/mL (ref <0.50)
Dihydrocodeine: NEGATIVE ng/mL (ref <2.5)
Fentanyl: NEGATIVE ng/mL (ref <0.10)
Flunitrazepam: NEGATIVE ng/mL (ref <0.50)
Flurazepam: NEGATIVE ng/mL (ref <0.50)
Heroin Metabolite: NEGATIVE ng/mL (ref <1.0)
Hydrocodone: NEGATIVE ng/mL (ref <2.5)
Hydromorphone: NEGATIVE ng/mL (ref <2.5)
Lorazepam: NEGATIVE ng/mL (ref <0.50)
MARIJUANA: NEGATIVE ng/mL (ref <2.5)
MDMA: NEGATIVE ng/mL (ref <10)
Meprobamate: NEGATIVE ng/mL (ref <2.5)
Methadone: NEGATIVE ng/mL (ref <5.0)
Midazolam: NEGATIVE ng/mL (ref <0.50)
Morphine: NEGATIVE ng/mL (ref <2.5)
Nicotine Metabolite: NEGATIVE ng/mL (ref <5.0)
Nordiazepam: NEGATIVE ng/mL (ref <0.50)
Norhydrocodone: NEGATIVE ng/mL (ref <2.5)
Noroxycodone: 4.6 ng/mL — ABNORMAL HIGH (ref <2.5)
Opiates: POSITIVE ng/mL — AB (ref <2.5)
Oxazepam: NEGATIVE ng/mL (ref <0.50)
Oxycodone: 69.1 ng/mL — ABNORMAL HIGH (ref <2.5)
Oxymorphone: NEGATIVE ng/mL (ref <2.5)
Phencyclidine: NEGATIVE ng/mL (ref <10)
Tapentadol: NEGATIVE ng/mL (ref <5.0)
Temazepam: NEGATIVE ng/mL (ref <0.50)
Tramadol: NEGATIVE ng/mL (ref <5.0)
Triazolam: NEGATIVE ng/mL (ref <0.50)
Zolpidem: NEGATIVE ng/mL (ref <5.0)

## 2020-11-07 LAB — DRUG TOX ALC METAB W/CON, ORAL FLD: Alcohol Metabolite: NEGATIVE ng/mL (ref <25)

## 2020-11-16 ENCOUNTER — Other Ambulatory Visit: Payer: Self-pay | Admitting: Interventional Cardiology

## 2020-11-20 ENCOUNTER — Telehealth: Payer: Self-pay | Admitting: *Deleted

## 2020-11-20 NOTE — Telephone Encounter (Signed)
Oral swab drug screen was consistent for prescribed medications.  ?

## 2020-11-23 ENCOUNTER — Other Ambulatory Visit: Payer: Self-pay | Admitting: Interventional Cardiology

## 2020-11-24 ENCOUNTER — Other Ambulatory Visit: Payer: Self-pay | Admitting: Interventional Cardiology

## 2020-11-27 ENCOUNTER — Other Ambulatory Visit: Payer: Self-pay | Admitting: Physical Medicine and Rehabilitation

## 2020-11-27 MED ORDER — MORPHINE SULFATE ER 15 MG PO TBCR: 15.0000 mg | EXTENDED_RELEASE_TABLET | Freq: Two times a day (BID) | ORAL | 0 refills | Status: DC

## 2020-12-11 ENCOUNTER — Other Ambulatory Visit: Payer: Self-pay | Admitting: Physical Medicine and Rehabilitation

## 2020-12-11 DIAGNOSIS — N179 Acute kidney failure, unspecified: Secondary | ICD-10-CM

## 2020-12-12 NOTE — Addendum Note (Signed)
Addended by: Izora Ribas on: 12/12/2020 10:30 AM   Modules accepted: Orders

## 2020-12-13 ENCOUNTER — Other Ambulatory Visit: Payer: Self-pay

## 2020-12-13 ENCOUNTER — Encounter
Payer: Medicare Other | Attending: Physical Medicine and Rehabilitation | Admitting: Physical Medicine and Rehabilitation

## 2020-12-13 ENCOUNTER — Encounter: Payer: Self-pay | Admitting: Physical Medicine and Rehabilitation

## 2020-12-13 VITALS — BP 143/73 | HR 71 | Temp 98.5°F | Ht 64.0 in | Wt 220.0 lb

## 2020-12-13 DIAGNOSIS — M47816 Spondylosis without myelopathy or radiculopathy, lumbar region: Secondary | ICD-10-CM

## 2020-12-13 DIAGNOSIS — G459 Transient cerebral ischemic attack, unspecified: Secondary | ICD-10-CM | POA: Diagnosis not present

## 2020-12-13 DIAGNOSIS — M797 Fibromyalgia: Secondary | ICD-10-CM | POA: Diagnosis not present

## 2020-12-13 DIAGNOSIS — M47812 Spondylosis without myelopathy or radiculopathy, cervical region: Secondary | ICD-10-CM | POA: Diagnosis not present

## 2020-12-13 DIAGNOSIS — M7918 Myalgia, other site: Secondary | ICD-10-CM | POA: Diagnosis not present

## 2020-12-13 MED ORDER — OXYCODONE-ACETAMINOPHEN 10-325 MG PO TABS
1.0000 | ORAL_TABLET | Freq: Two times a day (BID) | ORAL | 0 refills | Status: DC | PRN
Start: 2020-12-13 — End: 2021-08-21

## 2020-12-13 MED ORDER — LIDOCAINE 5 % EX PTCH: 1.0000 | MEDICATED_PATCH | CUTANEOUS | 0 refills | Status: DC

## 2020-12-13 MED ORDER — PREGABALIN 25 MG PO CAPS: 25.0000 mg | ORAL_CAPSULE | Freq: Two times a day (BID) | ORAL | 0 refills | Status: DC

## 2020-12-13 NOTE — Progress Notes (Signed)
Subjective:    Patient ID: Sheryl Curtis, female    DOB: 10-30-1931, 87 y.o.   MRN: [id number]   HPI  Sheryl Curtis is an 87 year old woman who presents for follow-up of her fibromyalgia, left shoulder pain, lumbar and cervical facet arthropathy, opioid induced constipation, and with new right sided greater trochanteric pain syndrome and right hip OA.  1) Lumbar and cervical facet arthropathy:  -Pan has been severe.   -We tried long-acting Morphine and she did not like the way it made her feel. -She is currently taking Percocet and Lyrica 1 tab daily.  -It is currently worse in her right lumbar spine and radiates into her right leg.  -Her pain is worse with extension.   2) Quality of life:  -Quality of life has been poor.  -Pain really limited her function  -She is unable to get up and move much due to her pain.   3) Opioid induced constipation:  -She usually has a daily BM but sometimes can get constipated.  -She likess prunes.  -She takes Colace or Miralax once in a while.  -She had good success with prunes and coffee together.   4) Hx of TIA: No more TIA symptoms. Memory has never been the same since her TIA.   5) Cervical myofascial pain syndrome: -She would like like to try trigger point injections next visit -She would like to try PT for myofascial release and stretching and strengthening exercises.   Prior history: Does sometimes have difficulty breathing. She was told she may need a fluid pill.   Her right hip pain continues to be severe. She has been unable to ambulate much due to this pain.   She was started on Plavix after her TIA.   She has a follow-up appointment with Dr. Ernestina Patches on December 8th. Her cervical pain continues to be severe and she benefited from injections in the past.  Prior history:  Currently her chief complaint is her right sided hip pain. She recently had a steroid injection that gave her no relief. Discussed lidocaine/normal saline trigger  point injections and she elected to try these today.  Her pain has been getting unbearable. She stopped getting relief from the oxycodone. Discussed restarting Lyrica which helped her at lower doses. Higher dose caused her to have the shakes and made her go to ED.   Other medications she has failed include: Tramadol, Tylenol, Percocet, NSAIDs, Cymbalta, Amitriptyline, Tylenol with codeine, Skelaxin, Flexeril, and Tizanidine.   She does not take NSAIDs due to her cardiac history.   Prior history: Sheryl Curtis presentsfor follow-up of her fibromyalgia, chronic left shoulder pain,andcervical spondylosis. She has pain in her bilateral shoulders, neck, bilateral hips, knees, ankles, and low back. She has been diagnosed with fibromyalgia, osteoarthritis, and rheumatoid arthritis.Previously I started her on Percocet after she has failed or refused most other medications (Lyrica, Tramadol, Tylenol, NSAIDs, Cymbalta, Amitriptyline, Tylenol with codeine, Skelaxin, Flexeril, Tizanidine), and she has noted improvement in her quality of life. She has been going outside more and has been enjoying her new puppy.She has been taking about 2 Percocets per day. She has noted increasing constipation. She has been taking Miralax as needed. She has a history of hemorrhoids.   She continues to have neck pain especially when turning her neck to the left side. This pain has prevented her from driving and is currently her most severe source of pain. She had cervical facet joint blocks by Dr. Asa Lente at the  [facility] without benefit. I have personally reviewed these notes and they were done on the left side at levels C2-C4. She follows with Dr. Lucille Passy her low back pain and has plan for lumbar facet joint radiofrequency ablation after insurance authorization, scheduled in August.   She has been sleeping poorly over the last few days and pain levels have also increased. She lives with her daughter. She  has a bedside commode for urination at night.   She would like to lose weight. Current weight is 209 lbs and she would like to get down to 185 lbs. She starts eating breakfast at 1pm and eats dinner at 5:30pm. She like to eat toast with eggs and tomato for breakfast. She likes to eat 6 hershey kisses as a snack but does not snack much beyond this.   Pain Inventory Average Pain 8 Pain Right Now 8 My pain is constant, sharp, dull and aching  In the last 24 hours, has pain interfered with the following? General activity 10 Relation with others 2 Enjoyment of life 10 What TIME of day is your pain at its worst? all the time Sleep (in general) Fair  Pain is worse with: walking, bending and standing Pain improves with: rest and medication Relief from Meds: ?    Family History  Problem Relation Age of Onset  . CAD Brother   . Heart attack Brother 75  . Bone cancer Sister   . Throat cancer Daughter    Social History   Socioeconomic History  . Marital status: Widowed    Spouse name: Not on file  . Number of children: 4  . Years of education: Not on file  . Highest education level: Not on file  Occupational History  . Not on file  Tobacco Use  . Smoking status: Former Smoker    Packs/day: 1.00    Years: 12.00    Pack years: 12.00    Types: Cigarettes    Start date: 10/28/1949    Quit date: 10/28/1962    Years since quitting: 58.1  . Smokeless tobacco: Never Used  Vaping Use  . Vaping Use: Never used  Substance and Sexual Activity  . Alcohol use: No  . Drug use: No  . Sexual activity: Not on file  Other Topics Concern  . Not on file  Social History Narrative  . Not on file   Social Determinants of Health   Financial Resource Strain: Not on file  Food Insecurity: No Food Insecurity  . Worried About Charity fundraiser in the Last Year: Never true  . Ran Out of Food in the Last Year: Never true  Transportation Needs: No Transportation Needs  . Lack of Transportation  (Medical): No  . Lack of Transportation (Non-Medical): No  Physical Activity: Not on file  Stress: Not on file  Social Connections: Not on file   Past Surgical History:  Procedure Laterality Date  . ABDOMINAL HYSTERECTOMY    . ANKLE FRACTURE SURGERY    . BACK SURGERY    . BREAST SURGERY    . COLON SURGERY    . CORONARY STENT PLACEMENT    . PERCUTANEOUS CORONARY STENT INTERVENTION (PCI-S) N/A 05/19/2013   Procedure: PERCUTANEOUS CORONARY STENT INTERVENTION (PCI-S);  Surgeon: Jettie Booze, MD;  Location: [facility] CATH LAB;  Service: Cardiovascular;  Laterality: N/A;   Past Medical History:  Diagnosis Date  . ALLERGIC RHINITIS   . Atrial fibrillation ([place])   . CANCER, COLORECTAL   . CHEST  PAIN, ATYPICAL   . CKD (chronic kidney disease)   . DYSPNEA   . Essential hypertension, benign   . FIBROMYALGIA   . G E R D   . History of stress test    Myoview 8/16:  EF 69%, anterior and apical defect c/w breast attenuation; Low Risk   . INSOMNIA   . Morbid obesity ([place])   . Other and unspecified angina pectoris   . Sleep apnea    There were no vitals taken for this visit.  Opioid Risk Score:   Fall Risk Score:  `1  Depression screen PHQ 2/9  Depression screen [facility] 2/9 11/01/2020 05/03/2020 03/07/2020 01/21/2020 10/25/2019 07/22/2019 04/21/2019  Decreased Interest 1 0 0 - 0 0 0  Down, Depressed, Hopeless 1 0 0 2 0 0 0  PHQ - 2 Score 2 0 0 2 0 0 0  Altered sleeping - - - 1 - - -  Tired, decreased energy - - - 0 - - -  Change in appetite - - - 0 - - -  Feeling bad or failure about yourself  - - - 1 - - -  Trouble concentrating - - - 1 - - -  Moving slowly or fidgety/restless - - - 0 - - -  Suicidal thoughts - - - 0 - - -  PHQ-9 Score - - - 5 - - -  Difficult doing work/chores - - - Somewhat difficult - - -  Some recent data might be hidden   Review of Systems  Constitutional: Positive for appetite change.       Taste is back   HENT: Negative.   Eyes: Negative.   Respiratory:  Positive for shortness of breath and wheezing.   Cardiovascular: Positive for chest pain.  Gastrointestinal: Positive for constipation.  Endocrine: Negative.   Genitourinary: Negative.   Musculoskeletal: Positive for arthralgias, back pain, gait problem, myalgias and neck pain.  Skin: Negative.   Allergic/Immunologic: Negative.   Neurological: Positive for weakness and numbness.       Tingling & spasms  Hematological: Bruises/bleeds easily.  Psychiatric/Behavioral: Negative.        Depression, anxiety       Objective:   Physical Exam Gen: no distress, normal appearing HEENT: oral mucosa pink and moist, NCAT Cardio: Reg rate Chest: normal effort, normal rate of breathing Abd: soft, non-distended Ext: no edema Psych: pleasant, normal affect Skin: intact Musculoskeletal: Cervical ROM very limited in all planes. Tenderness to palpation over lateral cervical facet joints bilaterally In wheelchair Right sided positive slump test for pain in L5 nerve distribution. Tenderness to palpation over right greater trochanteric bursa Gait: Able to stand, right sided lower back pain is exacerbated by extension. Antalgic gait with RW Psych: pleasant, normal affect     Assessment & Plan:  Mrs. Yamile Roedl presents for follow-up of diffuse pains and lumbar facet arthropathy.  She has pains in her bilateral shoulders, neck, bilateral hips, knees, ankles, and low back.  1) Fibromyalgia: -previously discontinued Lyrica as she had developed tremor at high dose. Discussed restarting low dose- she is tolerating this well- increase to 25mg  BID. Discussed that if she develops respiratory side effects with Percocet, which should discontinue Percocet and instead try to increase Lyrica dosage again.  --Had negative side effects in response to Skelaxin, Tramadol, and Tylenol with Codeine, Percocet, but prefers to continue Percocet and asks if strength may be increased. Discussed that this could worsen her  constipation and that it is common to  get tolerant to opioids. Change Percocet script to 10mg  BID PRN- sometimes she uses once or not at all. She only takes when very severe.   2) Chronic left shoulder pain:I have personally reviewed XR; within normal limits.Can consider steroid injection in the future. Limits her ability to quilt.   3)Lumbar facet arthropathy: Followed with Dr. Ernestina Patches for radiofrequency ablation in August. Unfortunately she did not get significant benefit.   4) Recent BTD:[id number] medications. She is on statin and Plavix. BP continues to be much better controlled this visit! Advised that systolic BP is slightly elevated, continue to monitor at home.   5) Quality of Life: Set back over the holidays due to pain. Encourage outdoor mobility and fresh air. Has strong social support of 3 daughters, son, and now new dog which gives her much joy.  7) Obesity 220 lbs: Provided dietary counseling. Weight has been stable since last visit. Continue to monitor.   8) Cervical myofascial pain syndrome -prescribed PT -will try trigger point injections next visit.   9) Cervical facet arthritis, worse on left side, now contributing to cervicogenic headaches.   -Lidocaine patch prescribed.   10) Insomnia: prescribed Amitriptyline 10mg  HS previously. Advised regarding cardiac side effects and risk of falls but patient prefers to try medication prior to clearance from cardiologist. Advised her daughter to monitor her first night to decrease risk of fall as she sometimes rises to use bedside commode at night.   11) Right sided greater trochanteric pain syndrome/OA: she did not get great benefit from steroid injection.   12) Opioid induced constipation: -Continue combination of prunes and coffee daily, which has been effective for her.   13) Shortness of breath: She has followed with Pulmonology and has imaging scheduled. Reviewed prior Echo and has EF of 65-70%. Does have  some lower extremity edema. Was advised to start Lasix but Cr is elevated in November labs. Advised continued mobility as much as possible.

## 2020-12-18 ENCOUNTER — Other Ambulatory Visit: Payer: Self-pay | Admitting: *Deleted

## 2020-12-18 DIAGNOSIS — D234 Other benign neoplasm of skin of scalp and neck: Secondary | ICD-10-CM | POA: Diagnosis not present

## 2020-12-18 DIAGNOSIS — D485 Neoplasm of uncertain behavior of skin: Secondary | ICD-10-CM | POA: Diagnosis not present

## 2020-12-18 DIAGNOSIS — L820 Inflamed seborrheic keratosis: Secondary | ICD-10-CM | POA: Diagnosis not present

## 2020-12-18 DIAGNOSIS — D225 Melanocytic nevi of trunk: Secondary | ICD-10-CM | POA: Diagnosis not present

## 2020-12-18 DIAGNOSIS — L821 Other seborrheic keratosis: Secondary | ICD-10-CM | POA: Diagnosis not present

## 2020-12-18 DIAGNOSIS — L814 Other melanin hyperpigmentation: Secondary | ICD-10-CM | POA: Diagnosis not present

## 2020-12-18 NOTE — Patient Outreach (Signed)
[facility]) Care Management  12/18/2020  Sheryl Curtis 04-23-1932 [id number]   Referral from Johny Shock, RN for medication assistance sent to [facility] Management Assistant [phone]

## 2020-12-18 NOTE — Addendum Note (Signed)
Addended by: Verlin Grills on: 12/18/2020 10:26 AM   Modules accepted: Orders

## 2020-12-21 NOTE — Patient Outreach (Signed)
[facility]) Care Management  12/21/2020  Sheryl Curtis 04/03/1932 [id number]  Addendum: Medication assistance referral sent to [employer] in error. Referral sent [facility] on 12/21/20.  Ina Homes [facility] Management Assistant [phone]

## 2020-12-25 ENCOUNTER — Other Ambulatory Visit: Payer: Self-pay | Admitting: Physical Medicine and Rehabilitation

## 2020-12-25 DIAGNOSIS — G8929 Other chronic pain: Secondary | ICD-10-CM

## 2021-01-03 ENCOUNTER — Ambulatory Visit: Payer: Medicare Other | Admitting: Pulmonary Disease

## 2021-01-05 DIAGNOSIS — G2581 Restless legs syndrome: Secondary | ICD-10-CM | POA: Diagnosis not present

## 2021-01-05 DIAGNOSIS — E039 Hypothyroidism, unspecified: Secondary | ICD-10-CM | POA: Diagnosis not present

## 2021-01-05 DIAGNOSIS — M1990 Unspecified osteoarthritis, unspecified site: Secondary | ICD-10-CM | POA: Diagnosis not present

## 2021-01-05 DIAGNOSIS — I10 Essential (primary) hypertension: Secondary | ICD-10-CM | POA: Diagnosis not present

## 2021-01-05 DIAGNOSIS — I700 Atherosclerosis of aorta: Secondary | ICD-10-CM | POA: Diagnosis not present

## 2021-01-05 DIAGNOSIS — N183 Chronic kidney disease, stage 3 unspecified: Secondary | ICD-10-CM | POA: Diagnosis not present

## 2021-01-08 ENCOUNTER — Ambulatory Visit: Payer: Medicare Other | Attending: Physical Medicine and Rehabilitation | Admitting: Physical Therapy

## 2021-01-09 ENCOUNTER — Telehealth: Payer: Self-pay | Admitting: Pulmonary Disease

## 2021-01-09 NOTE — Telephone Encounter (Signed)
Signed Rx for Judithann Sauger was faxed to [employer] for pt assistance application. Fax confirmation stated successful on 01/05/21.

## 2021-01-11 ENCOUNTER — Telehealth: Payer: Self-pay | Admitting: Physical Medicine and Rehabilitation

## 2021-01-11 NOTE — Telephone Encounter (Signed)
Pt daughter called and states that her mother would like to be seen by Dr.Newton asap. Thank you

## 2021-01-12 NOTE — Telephone Encounter (Signed)
Pt had right hip injection on 08/09/20 and she would like a repeat.

## 2021-01-15 NOTE — Telephone Encounter (Signed)
Called pt and she mention the inj in December didn't work, so she wanting the RFA again. Please Advise.

## 2021-01-15 NOTE — Telephone Encounter (Signed)
Do OV with Right trochanter injection same day - can be OV time slot

## 2021-01-15 NOTE — Telephone Encounter (Signed)
1.  The right hip injection on 08/09/2020 that you referenced was a greater trochanteric injection by Dr. Ninfa Linden and not by me.  The last injection we performed was in December and it was an epidural injection at L5-S1.  If she feels like it was a similar pain to the last injection we performed and we could repeat the epidural injection.  If that is the case double check for blood thinners etc.  Obviously if any trauma etc. then follow-up with Dr. Ninfa Linden.

## 2021-01-16 ENCOUNTER — Telehealth: Payer: Self-pay | Admitting: Interventional Cardiology

## 2021-01-16 DIAGNOSIS — R079 Chest pain, unspecified: Secondary | ICD-10-CM

## 2021-01-16 DIAGNOSIS — I25118 Atherosclerotic heart disease of native coronary artery with other forms of angina pectoris: Secondary | ICD-10-CM

## 2021-01-16 NOTE — Telephone Encounter (Signed)
Left message to call office tomorrow. 

## 2021-01-16 NOTE — Telephone Encounter (Signed)
I spoke with patient's daughter.  She reports patient has taken NTG on at least 3 occasions over the last week due to chest pain.  Pain relieved with one NTG each time.  Also having some arm numbness.  Daughter does not know which arm.  Pain occurs while patient is resting in bed.It does not wake her up from sleep.  Daughter reports patient is not very active due to arthritis. Patient usually only goes from bedroom to kitchen to bathroom during the day.  Is taking Imdur as listed.  I scheduled patient to see Dr Irish Lack on March 28,2022 at 3:40.  ED precautions reviewed with patient's daughter.

## 2021-01-16 NOTE — Telephone Encounter (Signed)
Plan for lexiscan myoview prior to appointment. JV

## 2021-01-16 NOTE — Telephone Encounter (Signed)
Pt c/o of Chest Pain: STAT if CP now or developed within 24 hours  1. Are you having CP right now? Patient's daughter states the patient is currently asleep/no active CP  2. Are you experiencing any other symptoms (ex. SOB, nausea, vomiting, sweating)?  Arm pain/numbness (unsure which arm)  3. How long have you been experiencing CP? Past 3-4 days, per patient's daughter   44. Is your CP continuous or coming and going? Coming and going  5. Have you taken Nitroglycerin? Yes, patient's daughter states the patient has taken about 3 nitroglycerin in the past few days ?

## 2021-01-17 ENCOUNTER — Telehealth (HOSPITAL_COMMUNITY): Payer: Self-pay

## 2021-01-17 ENCOUNTER — Other Ambulatory Visit: Payer: Self-pay | Admitting: *Deleted

## 2021-01-17 NOTE — Telephone Encounter (Addendum)
Spoke with pt's daughter, [name], [place] and advised Dr Irish Lack would like for pt to have a Lexiscan Myoview prior to 01/22/2021 appointment.  Pt's daughter verbalizes understanding and is agreeable with plan.  Myoview order placed and sent to precert and [facility].

## 2021-01-17 NOTE — Telephone Encounter (Signed)
Scheduled for 4/12 at 1000.

## 2021-01-17 NOTE — Telephone Encounter (Signed)
Spoke with the patient and family member, detailed instructions given. She stated that she understood and would be here for her test. Asked to call back with any questions. S.Williams EMTP

## 2021-01-17 NOTE — Telephone Encounter (Signed)
Patient's daughter returning call. 

## 2021-01-18 ENCOUNTER — Encounter (HOSPITAL_COMMUNITY): Payer: Medicare Other

## 2021-01-18 ENCOUNTER — Other Ambulatory Visit: Payer: Self-pay

## 2021-01-18 ENCOUNTER — Telehealth: Payer: Self-pay | Admitting: Interventional Cardiology

## 2021-01-18 ENCOUNTER — Encounter: Payer: Self-pay | Admitting: Pulmonary Disease

## 2021-01-18 ENCOUNTER — Ambulatory Visit: Payer: Medicare Other | Admitting: Pulmonary Disease

## 2021-01-18 DIAGNOSIS — J4530 Mild persistent asthma, uncomplicated: Secondary | ICD-10-CM

## 2021-01-18 DIAGNOSIS — I5032 Chronic diastolic (congestive) heart failure: Secondary | ICD-10-CM | POA: Diagnosis not present

## 2021-01-18 DIAGNOSIS — J9611 Chronic respiratory failure with hypoxia: Secondary | ICD-10-CM | POA: Diagnosis not present

## 2021-01-18 DIAGNOSIS — I5033 Acute on chronic diastolic (congestive) heart failure: Secondary | ICD-10-CM | POA: Insufficient documentation

## 2021-01-18 NOTE — Telephone Encounter (Signed)
Will send message to Dr. Hassell Done nurse as this is just an FYI from the pt's daughter.

## 2021-01-18 NOTE — Addendum Note (Signed)
Addended by: Gavin Potters R on: 01/18/2021 04:27 PM   Modules accepted: Orders

## 2021-01-18 NOTE — Progress Notes (Signed)
   Subjective:    Patient ID: Sheryl Curtis, female    DOB: [birth year], 87 y.o.   MRN: [id number]  HPI  yoremote smoker with severe RA,for FU ofairwayObstruction& OSA She has severe rheumatoid arthritis with previous MTX useand fibromyalgia CT chest has shown multiple pulmonary nodules that have been stable,last 2018  PMH -rectal cancer status post resection in 2007,stent to LAD in 04/2013.onLasix daily for bipedal edema.  Last seen by me in 2020, she has seen app Covid infection 11/2019 but remains unvaccinated She did not tolerate CPAP and this was discontinued in 2020. She was maintained on Symbicort and Stiolto previously but this was changed to Kiron sometime in 2021  Last Ov - wt increased 218 lbs  - increased lasix & asked for HRCT due to bibasilar crackles-but this has not been obtained She continues to complain of shortness of breath. Weight had apparently increased to 228 pounds but is now back down to 222 She has an appointment with next week with cardiology and stress test is planned. She continues to be limited by right hip pain and orthopedics as advised injections  She arrives in a wheelchair, accompanied by her daughter who corroborates history.  On previous office visits ambulatory saturation did not need oxygen criteria.  But today she dropped to 88% and arrived at 91% on room air   Significant tests/ events reviewed   CTchest 03/2017 -stable multiple pulmonary nodules since 2015  Spirometry in 05/2010 showed FEV1 of 76% improvement postbronchodilator to 80%-1.60 with a ratio of 75 suggesting mild restriction. Spirometry 03/2014 FEV1 of 1.25-64%, with ratio 65 and FVC of 1.91-72%.  PFTs - 12/2014 - no airway obstruction, ratio 71, pos BD response PFTs 11/2018, and mild airway obstruction with FEV1 79%, TLC 101%, DLCO normal, no bronchodilator response  06/2015 Admit for UTI and Sepsis   NPSG >>AHI 10/h corrected by CPAP 10 cm  Review of  Systems neg for any significant sore throat, dysphagia, itching, sneezing, nasal congestion or excess/ purulent secretions, fever, chills, sweats, unintended wt loss, pleuritic or exertional cp, hempoptysis, orthopnea pnd or change in chronic leg swelling. Also denies presyncope, palpitations, heartburn, abdominal pain, nausea, vomiting, diarrhea or change in bowel or urinary habits, dysuria,hematuria, rash, arthralgias, visual complaints, headache, numbness weakness or ataxia.     Objective:   Physical Exam  Gen. Pleasant, obese, in no distress ENT - no lesions, no post nasal drip Neck: No JVD, no thyromegaly, no carotid bruits Lungs: no use of accessory muscles, no dullness to percussion,bibasal fine dry rales no rhonchi  Cardiovascular: Rhythm regular, heart sounds  normal, no murmurs or gallops, 1+ peripheral edema Musculoskeletal: No deformities, no cyanosis or clubbing , no tremors       Assessment & Plan:

## 2021-01-18 NOTE — Telephone Encounter (Signed)
Sheryl Curtis, Daughter of the patient called. The patient has arthritis and was not able to get up early to do the stress test 3/24. She was able to get her test rescheduled to [facility] 3/28 at 11:00. The daughter just wanted to make Dr. Irish Lack aware of the change

## 2021-01-18 NOTE — Patient Instructions (Signed)
Stay on Lasix 40 mg daily -goal weight should be about 218 pounds. Ambulatory saturation. Oxycodone to make her sleepy

## 2021-01-18 NOTE — Assessment & Plan Note (Signed)
Continue Lasix 40 mg daily and aim for dry weight of 218 pounds or lower

## 2021-01-18 NOTE — Assessment & Plan Note (Signed)
Previous PFTs have not shown any evidence of airway obstruction really doubt the diagnosis of asthma here but due to persistent shortness of breath have maintained her on Breztri.  I think the main issue is chronic diastolic heart failure and fluid retention

## 2021-01-18 NOTE — Assessment & Plan Note (Signed)
She qualifies for oxygen today based on desaturation on ambulation and we will provide her with 2 L oxygen to use during ambulation and during sleep

## 2021-01-21 NOTE — Progress Notes (Deleted)
Cardiology Office Note   Date:  01/21/2021   ID:  Sheryl Curtis, DOB 1931/11/25, MRN [id number]  PCP:  Aurea Graff.Marlou Sa, MD    No chief complaint on file.  CAD  Wt Readings from Last 3 Encounters:  01/18/21 222 lb 6.4 oz (100.9 kg)  12/13/20 220 lb (99.8 kg)  11/01/20 220 lb 3.2 oz (99.9 kg)       History of Present Illness: Sheryl Curtis is a 87 y.o. female  ***    Past Medical History:  Diagnosis Date  . ALLERGIC RHINITIS   . Atrial fibrillation ([place])   . CANCER, COLORECTAL   . CHEST PAIN, ATYPICAL   . CKD (chronic kidney disease)   . DYSPNEA   . Essential hypertension, benign   . FIBROMYALGIA   . G E R D   . History of stress test    Myoview 8/16:  EF 69%, anterior and apical defect c/w breast attenuation; Low Risk   . INSOMNIA   . Morbid obesity ([place])   . Other and unspecified angina pectoris   . Sleep apnea     Past Surgical History:  Procedure Laterality Date  . ABDOMINAL HYSTERECTOMY    . ANKLE FRACTURE SURGERY    . BACK SURGERY    . BREAST SURGERY    . COLON SURGERY    . CORONARY STENT PLACEMENT    . PERCUTANEOUS CORONARY STENT INTERVENTION (PCI-S) N/A 05/19/2013   Procedure: PERCUTANEOUS CORONARY STENT INTERVENTION (PCI-S);  Surgeon: Jettie Booze, MD;  Location: [facility] CATH LAB;  Service: Cardiovascular;  Laterality: N/A;     Current Outpatient Medications  Medication Sig Dispense Refill  . acetaminophen (TYLENOL) 650 MG CR tablet Take 650 mg by mouth every 8 (eight) hours as needed for pain.     Marland Kitchen albuterol (PROVENTIL) (2.5 MG/3ML) 0.083% nebulizer solution USE 1 VIAL VIA NEBULIZER EVERY 6 HOURS AS NEEDED (Patient taking differently: Take 2.5 mg by nebulization in the morning and at bedtime.) 1080 mL 1  . Albuterol Sulfate (PROAIR RESPICLICK) 244 (90 Base) MCG/ACT AEPB Inhale 1-2 puffs into the lungs every 6 (six) hours as needed (for shortness of breath and/or wheezing.). 1 each 6  . ALPRAZolam (XANAX) 0.25 MG tablet Take 0.25 mg by mouth  3 (three) times daily as needed for anxiety.    Marland Kitchen amitriptyline (ELAVIL) 10 MG tablet Take 1 tablet (10 mg total) by mouth at bedtime as needed for sleep. 90 tablet 0  . Ascorbic Acid (VITAMIN C) 1000 MG tablet Take 1,000 mg by mouth daily.    . Budeson-Glycopyrrol-Formoterol (BREZTRI AEROSPHERE) 160-9-4.8 MCG/ACT AERO Inhale 2 puffs into the lungs in the morning and at bedtime. (Patient not taking: Reported on 01/18/2021) 5.9 g 0  . Budeson-Glycopyrrol-Formoterol (BREZTRI AEROSPHERE) 160-9-4.8 MCG/ACT AERO Inhale 2 puffs into the lungs in the morning and at bedtime. (Patient not taking: Reported on 01/18/2021) 2 g 0  . carbamide peroxide (DEBROX) 6.5 % OTIC solution Place 5 drops into both ears 2 (two) times daily. 15 mL 0  . Cholecalciferol (VITAMIN D) 50 MCG (2000 UT) tablet Take 2,000 Units by mouth daily.    . clopidogrel (PLAVIX) 75 MG tablet TAKE 1 TABLET(75 MG) BY MOUTH EVERY OTHER DAY (Patient taking differently: Take 75 mg by mouth daily.) 45 tablet 2  . diphenhydrAMINE (BENADRYL ALLERGY) 25 mg capsule Take 1 capsule (25 mg total) by mouth at bedtime as needed. 30 capsule 0  . docusate sodium (COLACE) 100 MG capsule Take  1 capsule (100 mg total) by mouth in the morning, at noon, and at bedtime. 10 capsule 0  . DULoxetine (CYMBALTA) 60 MG capsule Take 60 mg by mouth daily.    . furosemide (LASIX) 40 MG tablet TAKE 1 TABLET BY MOUTH  DAILY 90 tablet 3  . gabapentin (NEURONTIN) 300 MG capsule Take 1 capsule (300 mg total) by mouth at bedtime. 60 capsule 1  . ipratropium (ATROVENT) 0.03 % nasal spray Place 2 sprays into both nostrils every 12 (twelve) hours. 30 mL 1  . isosorbide mononitrate (IMDUR) 60 MG 24 hr tablet Take 1 tablet (60 mg total) by mouth daily. Please make yearly appt with Dr. Irish Lack for February 2022 for future refills. Thank you 1st attempt 90 tablet 0  . lidocaine (LIDODERM) 5 % Place 1 patch onto the skin daily. Remove & Discard patch within 12 hours or as directed by MD 30  patch 0  . morphine (MS CONTIN) 15 MG 12 hr tablet Take 1 tablet (15 mg total) by mouth every 12 (twelve) hours. 60 tablet 0  . Nebulizers (COMPRESSOR NEBULIZER) MISC 1 Device by Does not apply route once. 1 each 0  . nitroGLYCERIN (NITROSTAT) 0.4 MG SL tablet Place 1 tablet (0.4 mg total) under the tongue every 5 (five) minutes as needed for chest pain. 25 tablet 3  . Omega-3 Fatty Acids (FISH OIL) 1200 MG CAPS Take 1,200 mg by mouth daily.    Marland Kitchen oxyCODONE-acetaminophen (PERCOCET) 10-325 MG tablet Take 1 tablet by mouth 2 (two) times daily as needed for pain. 60 tablet 0  . polyethylene glycol (MIRALAX / GLYCOLAX) 17 g packet Take 17 g by mouth daily as needed for mild constipation.     . potassium chloride SA (KLOR-CON) 20 MEQ tablet TAKE 1 TABLET BY MOUTH  DAILY 90 tablet 0  . pregabalin (LYRICA) 25 MG capsule Take 1 capsule (25 mg total) by mouth 2 (two) times daily. 30 capsule 0  . Respiratory Therapy Supplies (FLUTTER) DEVI Use flutter device 3 times a day 1 each 0  . rosuvastatin (CRESTOR) 20 MG tablet TAKE 1 TABLET BY MOUTH  DAILY 90 tablet 3  . senna (SENOKOT) 8.6 MG TABS tablet Take 2 tablets (17.2 mg total) by mouth at bedtime. 120 tablet 0  . valsartan-hydrochlorothiazide (DIOVAN-HCT) 160-12.5 MG tablet Take 1 tablet by mouth daily.     No current facility-administered medications for this visit.    Allergies:   Aspirin, Hydroxyquinolines, Imipramine hcl, Pamelor [nortriptyline hcl], Prednisone, Pregabalin, Procardia [nifedipine], Tramadol, Ace inhibitors, Hydroxyzine hcl, Influenza vaccines, Morphine and related, Oxycodone-acetaminophen, Gabapentin, and Plaquenil [hydroxychloroquine sulfate]    Social History:  The patient  reports that she quit smoking about 58 years ago. Her smoking use included cigarettes. She started smoking about 71 years ago. She has a 12.00 pack-year smoking history. She has never used smokeless tobacco. She reports that she does not drink alcohol and does not  use drugs.   Family History:  The patient's ***family history includes Bone cancer in her sister; CAD in her brother; Heart attack (age of onset: 34) in her brother; Throat cancer in her daughter.    ROS:  Please see the history of present illness.   Otherwise, review of systems are positive for ***.   All other systems are reviewed and negative.    PHYSICAL EXAM: VS:  There were no vitals taken for this visit. , BMI There is no height or weight on file to calculate BMI. GEN: Well nourished,  well developed, in no acute distress HEENT: normal Neck: no JVD, carotid bruits, or masses Cardiac: ***RRR; no murmurs, rubs, or gallops,no edema  Respiratory:  clear to auscultation bilaterally, normal work of breathing GI: soft, nontender, nondistended, + BS MS: no deformity or atrophy Skin: warm and dry, no rash Neuro:  Strength and sensation are intact Psych: euthymic mood, full affect   EKG:   The ekg ordered today demonstrates ***   Recent Labs: 09/18/2020: ALT 20; BUN 25; Creatinine, Ser 1.44; Hemoglobin 13.9; NT-Pro BNP 271; Platelets 254.0; Potassium 4.7; Sodium 140   Lipid Panel    Component Value Date/Time   CHOL 177 05/13/2019 1005   TRIG 112 05/13/2019 1005   HDL 51 05/13/2019 1005   CHOLHDL 3.5 05/13/2019 1005   LDLCALC 104 (H) 05/13/2019 1005     Other studies Reviewed: Additional studies/ records that were reviewed today with results demonstrating: ***.   ASSESSMENT AND PLAN:  1. CAD:  2. Chronic diastolic heart failure: 3. Hyperlipidemia: 4. HTN: 5. OSA:   Current medicines are reviewed at length with the patient today.  The patient concerns regarding her medicines were addressed.  The following changes have been made:  No change***  Labs/ tests ordered today include: *** No orders of the defined types were placed in this encounter.   Recommend 150 minutes/week of aerobic exercise Low fat, low carb, high fiber diet recommended  Disposition:   FU in  ***   Signed, Larae Grooms, MD  01/21/2021 9:05 PM    [facility] [place], [place], [place]  [address] Phone: [phone]; Fax: [phone]

## 2021-01-22 ENCOUNTER — Other Ambulatory Visit: Payer: Self-pay

## 2021-01-22 ENCOUNTER — Ambulatory Visit (HOSPITAL_COMMUNITY): Payer: Medicare Other | Attending: Cardiovascular Disease

## 2021-01-22 ENCOUNTER — Ambulatory Visit: Payer: Medicare Other | Admitting: Interventional Cardiology

## 2021-01-22 ENCOUNTER — Ambulatory Visit (INDEPENDENT_AMBULATORY_CARE_PROVIDER_SITE_OTHER)
Admission: RE | Admit: 2021-01-22 | Discharge: 2021-01-22 | Disposition: A | Discharge status: Home / Self Care | Payer: Medicare Other | Source: Ambulatory Visit | Attending: Pulmonary Disease | Admitting: Pulmonary Disease

## 2021-01-22 DIAGNOSIS — J9611 Chronic respiratory failure with hypoxia: Secondary | ICD-10-CM

## 2021-01-22 DIAGNOSIS — R079 Chest pain, unspecified: Secondary | ICD-10-CM | POA: Diagnosis not present

## 2021-01-22 DIAGNOSIS — E782 Mixed hyperlipidemia: Secondary | ICD-10-CM

## 2021-01-22 DIAGNOSIS — R0602 Shortness of breath: Secondary | ICD-10-CM | POA: Diagnosis not present

## 2021-01-22 DIAGNOSIS — I10 Essential (primary) hypertension: Secondary | ICD-10-CM

## 2021-01-22 DIAGNOSIS — I25118 Atherosclerotic heart disease of native coronary artery with other forms of angina pectoris: Secondary | ICD-10-CM

## 2021-01-22 DIAGNOSIS — I5032 Chronic diastolic (congestive) heart failure: Secondary | ICD-10-CM

## 2021-01-22 DIAGNOSIS — G4733 Obstructive sleep apnea (adult) (pediatric): Secondary | ICD-10-CM

## 2021-01-22 LAB — MYOCARDIAL PERFUSION IMAGING
LV dias vol: 51 mL (ref 46–106)
LV sys vol: 14 mL
Peak HR: 94 {beats}/min
Rest HR: 72 {beats}/min
SDS: 4
SRS: 3
SSS: 7
TID: 0.98

## 2021-01-22 MED ORDER — TECHNETIUM TC 99M TETROFOSMIN IV KIT
10.7000 | PACK | Freq: Once | INTRAVENOUS | Status: AC | PRN
  Administered 2021-01-22: 10.7 via INTRAVENOUS
  Filled 2021-01-22: qty 11

## 2021-01-22 MED ORDER — REGADENOSON 0.4 MG/5ML IV SOLN
0.4000 mg | Freq: Once | INTRAVENOUS | Status: AC
  Administered 2021-01-22: 0.4 mg via INTRAVENOUS

## 2021-01-22 MED ORDER — TECHNETIUM TC 99M TETROFOSMIN IV KIT
30.9000 | PACK | Freq: Once | INTRAVENOUS | Status: AC | PRN
  Administered 2021-01-22: 30.9 via INTRAVENOUS
  Filled 2021-01-22: qty 31

## 2021-01-23 ENCOUNTER — Other Ambulatory Visit: Payer: Self-pay | Admitting: *Deleted

## 2021-01-23 DIAGNOSIS — J9611 Chronic respiratory failure with hypoxia: Secondary | ICD-10-CM | POA: Diagnosis not present

## 2021-01-23 NOTE — Patient Outreach (Signed)
[facility]) Care Management  01/23/2021  MARQUIS DILES [birth year] [id number]  RN Health Coach telephone call to patient.  [employer] compliance verified. RN spoke with patient daughter [name]. Per [name] her mom was in the bed. She was not feeling well.   Plan: RN will follow up with further outreach call.  [place] Care Management [phone]

## 2021-01-23 NOTE — Patient Outreach (Signed)
[facility] [facility]) Care Management  01/23/2021  Sheryl Curtis 1932-04-03 [id number]   RN Health Coach received  telephone call from patient daughter [name].  [employer] compliance verified. Patient daughter was trying to get a battery for her mothers's wheel chair.  RN Health coach called [employer] where the patient thought the wheelchair was purchased. [employer] will pull the invoice information and if less than 5 yrs they can get a battery. If over 5 yrs then she will be able to get a new chair.   [place] Care Management [phone]

## 2021-01-23 NOTE — Progress Notes (Signed)
Called and went over CT results per Dr Elsworth Soho with patient. All questions answered and patient expressed full understanding of results. Nothing further needed at this time.

## 2021-01-26 ENCOUNTER — Telehealth: Payer: Self-pay | Admitting: Pulmonary Disease

## 2021-01-26 NOTE — Telephone Encounter (Signed)
Called and spoke with pt who wanted to know if she could bump herself up higher than 2L. Asked pt if she had a pulse oximeter that she could check her O2 sats with and she said that she did not. Stated to pt that she needed to make sure she got a pulse ox as with her being on the O2, she needs to monitor her O2 sats to make sure they stay stable and she verbalized understanding.  Asked pt if she had been taking the lasix and she said that she has not been doing it every day. Stated to her that Dr. Elsworth Soho stated at last visit that she needed to take 1-40mg  tablet of her lasix every day and have a goal weight of 218lbs. Pt weighed while on the phone with me and her weight was 221lbs. Stated to pt to make sure to take the lasix as if she were to get fluid overloaded, that would also affect her breathing and she verbalized understanding. Nothing further needed.

## 2021-01-29 ENCOUNTER — Telehealth: Payer: Self-pay | Admitting: Pulmonary Disease

## 2021-01-29 ENCOUNTER — Ambulatory Visit: Payer: Medicare Other | Admitting: Physical Medicine and Rehabilitation

## 2021-01-29 NOTE — Telephone Encounter (Signed)
She qualifies for oxygen today based on desaturation on ambulation and we will provide her with 2 L oxygen to use during ambulation and during sleep  I have attempted to call the pt but the phone rings and then disconnects into a busy signal.  Will try back

## 2021-01-29 NOTE — Telephone Encounter (Signed)
pt daughter is calling because she just recieved her o2 & its currently set to 2. pt said it wasnt enough o2 so she was wondering if she could turn it up. pt daughter also mentioned that if pt unable to switch the level, she would not like to use it.--[phone]

## 2021-01-30 NOTE — Progress Notes (Signed)
This encounter was created in error - please disregard.

## 2021-01-31 ENCOUNTER — Telehealth: Payer: Self-pay | Admitting: Interventional Cardiology

## 2021-01-31 ENCOUNTER — Other Ambulatory Visit: Payer: Self-pay

## 2021-01-31 ENCOUNTER — Emergency Department (HOSPITAL_COMMUNITY)
Admission: EM | Admit: 2021-01-31 | Discharge: 2021-01-31 | Disposition: A | Discharge status: Home / Self Care | Payer: Medicare Other | Attending: Emergency Medicine | Admitting: Emergency Medicine

## 2021-01-31 ENCOUNTER — Emergency Department (HOSPITAL_COMMUNITY): Payer: Medicare Other

## 2021-01-31 ENCOUNTER — Encounter (HOSPITAL_COMMUNITY): Payer: Self-pay

## 2021-01-31 DIAGNOSIS — N183 Chronic kidney disease, stage 3 unspecified: Secondary | ICD-10-CM | POA: Diagnosis not present

## 2021-01-31 DIAGNOSIS — Z79899 Other long term (current) drug therapy: Secondary | ICD-10-CM | POA: Diagnosis not present

## 2021-01-31 DIAGNOSIS — I5032 Chronic diastolic (congestive) heart failure: Secondary | ICD-10-CM | POA: Diagnosis not present

## 2021-01-31 DIAGNOSIS — I2510 Atherosclerotic heart disease of native coronary artery without angina pectoris: Secondary | ICD-10-CM | POA: Insufficient documentation

## 2021-01-31 DIAGNOSIS — Z87891 Personal history of nicotine dependence: Secondary | ICD-10-CM | POA: Insufficient documentation

## 2021-01-31 DIAGNOSIS — J4541 Moderate persistent asthma with (acute) exacerbation: Secondary | ICD-10-CM | POA: Diagnosis not present

## 2021-01-31 DIAGNOSIS — Z85038 Personal history of other malignant neoplasm of large intestine: Secondary | ICD-10-CM | POA: Diagnosis not present

## 2021-01-31 DIAGNOSIS — R42 Dizziness and giddiness: Secondary | ICD-10-CM | POA: Diagnosis not present

## 2021-01-31 DIAGNOSIS — G9389 Other specified disorders of brain: Secondary | ICD-10-CM | POA: Diagnosis not present

## 2021-01-31 DIAGNOSIS — R251 Tremor, unspecified: Secondary | ICD-10-CM | POA: Diagnosis not present

## 2021-01-31 DIAGNOSIS — I639 Cerebral infarction, unspecified: Secondary | ICD-10-CM | POA: Diagnosis not present

## 2021-01-31 DIAGNOSIS — I130 Hypertensive heart and chronic kidney disease with heart failure and stage 1 through stage 4 chronic kidney disease, or unspecified chronic kidney disease: Secondary | ICD-10-CM | POA: Diagnosis not present

## 2021-01-31 DIAGNOSIS — Z7951 Long term (current) use of inhaled steroids: Secondary | ICD-10-CM | POA: Diagnosis not present

## 2021-01-31 DIAGNOSIS — R0789 Other chest pain: Secondary | ICD-10-CM | POA: Diagnosis not present

## 2021-01-31 LAB — BASIC METABOLIC PANEL
Anion gap: 6 (ref 5–15)
BUN: 14 mg/dL (ref 8–23)
CO2: 29 mmol/L (ref 22–32)
Calcium: 9.8 mg/dL (ref 8.9–10.3)
Chloride: 99 mmol/L (ref 98–111)
Creatinine, Ser: 1.14 mg/dL — ABNORMAL HIGH (ref 0.44–1.00)
GFR, Estimated: 47 mL/min — ABNORMAL LOW (ref >60)
Glucose, Bld: 101 mg/dL — ABNORMAL HIGH (ref 70–99)
Potassium: 5.1 mmol/L (ref 3.5–5.1)
Sodium: 134 mmol/L — ABNORMAL LOW (ref 135–145)

## 2021-01-31 LAB — CBC
HCT: 43.3 % (ref 36.0–46.0)
Hemoglobin: 13.7 g/dL (ref 12.0–15.0)
MCH: 31.9 pg (ref 26.0–34.0)
MCHC: 31.6 g/dL (ref 30.0–36.0)
MCV: 100.7 fL — ABNORMAL HIGH (ref 80.0–100.0)
Platelets: 230 10*3/uL (ref 150–400)
RBC: 4.3 MIL/uL (ref 3.87–5.11)
RDW: 13.9 % (ref 11.5–15.5)
WBC: 9.3 10*3/uL (ref 4.0–10.5)
nRBC: 0 % (ref 0.0–0.2)

## 2021-01-31 LAB — TROPONIN I (HIGH SENSITIVITY)
Troponin I (High Sensitivity): 9 ng/L (ref <18)
Troponin I (High Sensitivity): 9 ng/L (ref <18)

## 2021-01-31 NOTE — Telephone Encounter (Signed)
Called and spoke w/ dtr. Advised to have pt go to ED now for evaluation (she had same symptom with past stroke). Dtr aware I will notify hospital cardiology cardmaster that pt is on way to hospital, but aware neuro will be the lead if this is a stroke. Dtr appreciates the return call and is agreeable to plan. Will forward to Dr. Irish Lack for his [facility]

## 2021-01-31 NOTE — ED Provider Notes (Signed)
[facility] EMERGENCY DEPARTMENT Provider Note   CSN: [id number] Arrival date & time: 01/31/21  1419     History Chief Complaint  Patient presents with  .  Head tremor    Sheryl Curtis is a 87 y.o. female.  HPI Patient presents with tremor.  Reportedly sent in by cardiology.  States she had shakiness in her head and was able to sleep last night due to it.  States it will come and go somewhat.  States is feeling better now.  States sometimes her right arm also shake but not as doing at the same time.  She has difficulty controlling it when the symptoms are on.  No chest pain.  No trouble breathing.  No vision changes.  Reportedly had a previous stroke.  Sent in to rule out stroke by cardiology who talked with her on the phone.    Past Medical History:  Diagnosis Date  . ALLERGIC RHINITIS   . Atrial fibrillation ([place])   . CANCER, COLORECTAL   . CHEST PAIN, ATYPICAL   . CKD (chronic kidney disease)   . DYSPNEA   . Essential hypertension, benign   . FIBROMYALGIA   . G E R D   . History of stress test    Myoview 8/16:  EF 69%, anterior and apical defect c/w breast attenuation; Low Risk   . INSOMNIA   . Morbid obesity ([place])   . Other and unspecified angina pectoris   . Sleep apnea     Patient Active Problem List   Diagnosis Date Noted  . Chronic respiratory failure with hypoxia ([place]) 01/18/2021  . Chronic diastolic heart failure ([place]) 01/18/2021  . Medication management 09/18/2020  . Healthcare maintenance 09/18/2020  . Mild persistent asthma without complication 33/29/5188  . Bilateral sensorineural hearing loss 09/17/2019  . Dysphagia 06/14/2019  . Neck pain, chronic 04/21/2019  . Physical deconditioning 12/09/2018  . CKD (chronic kidney disease)   . Morbid obesity ([place])   . Mild persistent asthma with (acute) exacerbation 07/16/2018  . Cough 07/16/2018  . OSA (obstructive sleep apnea) 07/30/2017  . Trochanteric bursitis, right hip 04/21/2017  .  Nocturnal hypoxia 03/31/2017  . Sinus bradycardia 08/05/2016  . Hypertensive urgency 08/03/2016  . Asthma 02/19/2016  . Chronic kidney disease (CKD), stage III (moderate) ([place]) 07/05/2015  . CAFL (chronic airflow limitation) ([place]) 07/05/2015  . Generalized OA 07/05/2015  . 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 H/O malignant neoplasm of colon 07/05/2015  . Elevated troponin 07/02/2015  . UTI (lower urinary tract infection) 07/01/2015  . Fibromyalgia syndrome 07/01/2015  . Hyponatremia 07/01/2015  . Urinary tract infectious disease   . Pulmonary nodules/lesions, multiple 04/11/2014  . Abdominal pain, generalized 04/07/2014  . Coronary atherosclerosis of native coronary artery 10/12/2013  . Essential hypertension, benign   . Other and unspecified angina pectoris   . Atrial fibrillation ([place]) 04/19/2010  . Allergic rhinitis 04/19/2010  . DYSPNEA 04/19/2010  . CHEST PAIN, ATYPICAL 04/19/2010  . CANCER, COLORECTAL 04/18/2010  . Essential hypertension 04/18/2010  . G E R D 04/18/2010  . Fibromyalgia 04/18/2010  . INSOMNIA 04/18/2010    Past Surgical History:  Procedure Laterality Date  . ABDOMINAL HYSTERECTOMY    . ANKLE FRACTURE SURGERY    . BACK SURGERY    . BREAST SURGERY    . COLON SURGERY    . CORONARY STENT PLACEMENT    . PERCUTANEOUS CORONARY STENT INTERVENTION (PCI-S) N/A 05/19/2013   Procedure: PERCUTANEOUS CORONARY STENT INTERVENTION (PCI-S);  Surgeon: Jettie Booze, MD;  Location: [facility] CATH LAB;  Service: Cardiovascular;  Laterality: N/A;     OB History   No obstetric history on file.     Family History  Problem Relation Age of Onset  . CAD Brother   . Heart attack Brother 66  . Bone cancer Sister   . Throat cancer Daughter     Social History   Tobacco Use  . Smoking status: Former Smoker    Packs/day: 1.00    Years: 12.00    Pack years: 12.00    Types: Cigarettes    Start date: 10/28/1949    Quit date: 10/28/1962    Years since quitting: 58.3   . Smokeless tobacco: Never Used  Vaping Use  . Vaping Use: Never used  Substance Use Topics  . Alcohol use: No  . Drug use: No    Home Medications Prior to Admission medications   Medication Sig Start Date End Date Taking? Authorizing Provider  acetaminophen (TYLENOL) 650 MG CR tablet Take 650 mg by mouth every 8 (eight) hours as needed for pain.     [provider]  albuterol (PROVENTIL) (2.5 MG/3ML) 0.083% nebulizer solution USE 1 VIAL VIA NEBULIZER EVERY 6 HOURS AS NEEDED Patient taking differently: Take 2.5 mg by nebulization in the morning and at bedtime. 09/28/19   Rigoberto Noel, MD  Albuterol Sulfate (PROAIR RESPICLICK) 258 (90 Base) MCG/ACT AEPB Inhale 1-2 puffs into the lungs every 6 (six) hours as needed (for shortness of breath and/or wheezing.). 10/31/20   Rigoberto Noel, MD  ALPRAZolam Duanne Moron) 0.25 MG tablet Take 0.25 mg by mouth 3 (three) times daily as needed for anxiety.    [provider]  amitriptyline (ELAVIL) 10 MG tablet Take 1 tablet (10 mg total) by mouth at bedtime as needed for sleep. 09/11/20   Raulkar, Clide Deutscher, MD  Ascorbic Acid (VITAMIN C) 1000 MG tablet Take 1,000 mg by mouth daily.    [provider]  Budeson-Glycopyrrol-Formoterol (BREZTRI AEROSPHERE) 160-9-4.8 MCG/ACT AERO Inhale 2 puffs into the lungs in the morning and at bedtime. Patient not taking: Reported on 01/18/2021 09/18/20   Lauraine Rinne, NP  Budeson-Glycopyrrol-Formoterol (BREZTRI AEROSPHERE) 160-9-4.8 MCG/ACT AERO Inhale 2 puffs into the lungs in the morning and at bedtime. Patient not taking: Reported on 01/18/2021 10/31/20   Rigoberto Noel, MD  carbamide peroxide (DEBROX) 6.5 % OTIC solution Place 5 drops into both ears 2 (two) times daily. 04/05/20   Raulkar, Clide Deutscher, MD  Cholecalciferol (VITAMIN D) 50 MCG (2000 UT) tablet Take 2,000 Units by mouth daily.    [provider]  clopidogrel (PLAVIX) 75 MG tablet TAKE 1 TABLET(75 MG) BY MOUTH EVERY OTHER  DAY Patient taking differently: Take 75 mg by mouth daily. 10/29/16   Jettie Booze, MD  diphenhydrAMINE (BENADRYL ALLERGY) 25 mg capsule Take 1 capsule (25 mg total) by mouth at bedtime as needed. 12/28/19   Raulkar, Clide Deutscher, MD  docusate sodium (COLACE) 100 MG capsule Take 1 capsule (100 mg total) by mouth in the morning, at noon, and at bedtime. 05/03/20   Raulkar, Clide Deutscher, MD  DULoxetine (CYMBALTA) 60 MG capsule Take 60 mg by mouth daily. 09/25/17   [provider]  furosemide (LASIX) 40 MG tablet TAKE 1 TABLET BY MOUTH  DAILY 03/15/20   Imogene Burn, PA-C  gabapentin (NEURONTIN) 300 MG capsule Take 1 capsule (300 mg total) by mouth at bedtime. 04/19/20   Mcarthur Rossetti,  MD  ipratropium (ATROVENT) 0.03 % nasal spray Place 2 sprays into both nostrils every 12 (twelve) hours. 10/06/18   Martyn Ehrich, NP  isosorbide mononitrate (IMDUR) 60 MG 24 hr tablet Take 1 tablet (60 mg total) by mouth daily. Please make yearly appt with Dr. Irish Lack for February 2022 for future refills. Thank you 1st attempt 11/17/20   Jettie Booze, MD  lidocaine (LIDODERM) 5 % Place 1 patch onto the skin daily. Remove & Discard patch within 12 hours or as directed by MD 12/13/20   Raulkar, Clide Deutscher, MD  morphine (MS CONTIN) 15 MG 12 hr tablet Take 1 tablet (15 mg total) by mouth every 12 (twelve) hours. 11/27/20   Raulkar, Clide Deutscher, MD  Nebulizers (COMPRESSOR NEBULIZER) MISC 1 Device by Does not apply route once. 04/04/16   Deneise Lever, MD  nitroGLYCERIN (NITROSTAT) 0.4 MG SL tablet Place 1 tablet (0.4 mg total) under the tongue every 5 (five) minutes as needed for chest pain. 12/24/19   Jettie Booze, MD  Omega-3 Fatty Acids (FISH OIL) 1200 MG CAPS Take 1,200 mg by mouth daily.    [provider]  oxyCODONE-acetaminophen (PERCOCET) 10-325 MG tablet Take 1 tablet by mouth 2 (two) times daily as needed for pain. 12/13/20   Raulkar, Clide Deutscher, MD  polyethylene glycol  (MIRALAX / GLYCOLAX) 17 g packet Take 17 g by mouth daily as needed for mild constipation.     [provider]  potassium chloride SA (KLOR-CON) 20 MEQ tablet TAKE 1 TABLET BY MOUTH  DAILY 11/27/20   Jettie Booze, MD  pregabalin (LYRICA) 25 MG capsule Take 1 capsule (25 mg total) by mouth 2 (two) times daily. 12/13/20   Izora Ribas, MD  Respiratory Therapy Supplies (FLUTTER) DEVI Use flutter device 3 times a day 07/22/18   Martyn Ehrich, NP  rosuvastatin (CRESTOR) 20 MG tablet TAKE 1 TABLET BY MOUTH  DAILY 11/23/20   Jettie Booze, MD  senna (SENOKOT) 8.6 MG TABS tablet Take 2 tablets (17.2 mg total) by mouth at bedtime. 05/03/20   Raulkar, Clide Deutscher, MD  valsartan-hydrochlorothiazide (DIOVAN-HCT) 160-12.5 MG tablet Take 1 tablet by mouth daily.    [provider]    Allergies    Aspirin, Hydroxyquinolines, Imipramine hcl, Pamelor [nortriptyline hcl], Prednisone, Pregabalin, Procardia [nifedipine], Tramadol, Ace inhibitors, Hydroxyzine hcl, Influenza vaccines, Morphine and related, Oxycodone-acetaminophen, Gabapentin, and Plaquenil [hydroxychloroquine sulfate]  Review of Systems   Review of Systems  Constitutional: Negative for appetite change.  Respiratory: Negative for shortness of breath.   Gastrointestinal: Negative for abdominal pain.  Genitourinary: Negative for flank pain.  Musculoskeletal: Negative for back pain.  Skin: Negative for rash.  Neurological: Positive for tremors.  Psychiatric/Behavioral: Negative for confusion.    Physical Exam Updated Vital Signs BP 137/70   Pulse 85   Temp 98 F (36.7 C) (Oral)   Resp 18   SpO2 93%   Physical Exam Vitals reviewed.  HENT:     Head: Atraumatic.  Cardiovascular:     Rate and Rhythm: Normal rate.  Pulmonary:     Breath sounds: No wheezing, rhonchi or rales.  Chest:     Chest wall: No tenderness.  Abdominal:     Tenderness: There is no abdominal tenderness.  Musculoskeletal:      Cervical back: Neck supple.     Right lower leg: No edema.     Left lower leg: No edema.  Skin:    General: Skin is warm.  Capillary Refill: Capillary refill takes less than 2 seconds.  Neurological:     Mental Status: She is alert and oriented to person, place, and time.     Comments: No tremor of head or upper extremities.  Finger-nose intact bilaterally.     ED Results / Procedures / Treatments   Labs (all labs ordered are listed, but only abnormal results are displayed) Labs Reviewed  BASIC METABOLIC PANEL - Abnormal; Notable for the following components:      Result Value   Sodium 134 (*)    Glucose, Bld 101 (*)    Creatinine, Ser 1.14 (*)    GFR, Estimated 47 (*)    All other components within normal limits  CBC - Abnormal; Notable for the following components:   MCV 100.7 (*)    All other components within normal limits  TROPONIN I (HIGH SENSITIVITY)  TROPONIN I (HIGH SENSITIVITY)    EKG None  Radiology DG Chest 2 View  Result Date: 01/31/2021 CLINICAL DATA:  Shakiness EXAM: CHEST - 2 VIEW COMPARISON:  09/18/2020 FINDINGS: Heart and mediastinal contours are within normal limits. No focal opacities or effusions. No acute bony abnormality. IMPRESSION: No active cardiopulmonary disease. Electronically Signed   By: Rolm Baptise M.D.   On: 01/31/2021 17:09   CT Head Wo Contrast  Result Date: 01/31/2021 CLINICAL DATA:  Dizziness EXAM: CT HEAD WITHOUT CONTRAST TECHNIQUE: Contiguous axial images were obtained from the base of the skull through the vertex without intravenous contrast. COMPARISON:  12/06/2019 FINDINGS: Brain: No evidence of acute infarction, hemorrhage, hydrocephalus, extra-axial collection or mass lesion/mass effect. Mild subcortical white matter and periventricular small vessel ischemic changes. Old right basal ganglia lacunar infarct. Vascular: Mild intracranial atherosclerosis. Skull: Normal. Negative for fracture or focal lesion. Sinuses/Orbits: The  visualized paranasal sinuses are essentially clear. The mastoid air cells are unopacified. Other: None. IMPRESSION: No evidence of acute intracranial abnormality. Mild small vessel ischemic changes with old right basal ganglia lacunar infarct. Electronically Signed   By: Julian Hy M.D.   On: 01/31/2021 16:15   MR BRAIN WO CONTRAST  Result Date: 01/31/2021 CLINICAL DATA:  Initial evaluation for neuro deficit, stroke suspected. EXAM: MRI HEAD WITHOUT CONTRAST TECHNIQUE: Multiplanar, multiecho pulse sequences of the brain and surrounding structures were obtained without intravenous contrast. COMPARISON:  Prior head CT from earlier the same day. FINDINGS: Brain: Diffuse prominence of the CSF containing spaces compatible generalized age-related cerebral volume loss. Minimal T2/FLAIR hyperintensity seen within the periventricular white matter, most like related chronic microvascular ischemic disease, felt to be within normal limits for age. No abnormal foci of restricted diffusion to suggest acute or subacute ischemia. Gray-white matter differentiation maintained. No encephalomalacia to suggest chronic cortical infarction. No foci of susceptibility artifact to suggest acute or chronic intracranial hemorrhage. No mass lesion, midline shift or mass effect. No hydrocephalus or extra-axial fluid collection. No made of a partially empty sella. Midline structures intact. Note made of a 7 mm well-circumscribed cystic lesion along the right aspect of the cavernous sinus, indeterminate, but of doubtful clinical significance (series 6, image 10). This is stable from previous. Vascular: Major intracranial vascular flow voids are maintained. Skull and upper cervical spine: Craniocervical junction within normal limits. Bone marrow signal intensity normal. No scalp soft tissue abnormality. Sinuses/Orbits: Globes and orbital soft tissues demonstrate no acute finding. Patient status post bilateral ocular lens replacement.  Paranasal sinuses are largely clear. No mastoid effusion. Inner ear structures grossly normal. Other: None. IMPRESSION: Normal brain MRI for age.  No acute intracranial abnormality identified. Electronically Signed   By: Jeannine Boga M.D.   On: 01/31/2021 19:40    Procedures Procedures   Medications Ordered in ED Medications - No data to display  ED Course  I have reviewed the triage vital signs and the nursing notes.  Pertinent labs & imaging results that were available during my care of the patient were reviewed by me and considered in my medical decision making (see chart for details).    MDM Rules/Calculators/A&P                          Patient came in because of a tremor in her head.  Reportedly did not sleep well because of it last night.  Discussed with cardiology who sent her in to rule out stroke.  MRI done and reassuring.  No acute stroke seen.  No lateralizing findings this time.  Doubt acute stroke.  Lab work reassuring.  Patient can follow-up as an outpatient for current tremor.  Tremor improved now.  Doubt cardiac cause of the tremor.  Final Clinical Impression(s) / ED Diagnoses Final diagnoses:  Tremor    Rx / DC Orders ED Discharge Orders    None       Davonna Belling, MD 01/31/21 2025

## 2021-01-31 NOTE — ED Notes (Signed)
Pt in MRI.

## 2021-01-31 NOTE — ED Notes (Signed)
Pt still remains in MRI. Will obtain vitals when pt returns.

## 2021-01-31 NOTE — Telephone Encounter (Signed)
ATC daughter, [name], back x1.  Line just rang, no answer, no vm.

## 2021-01-31 NOTE — ED Notes (Signed)
PT transported to MRI

## 2021-01-31 NOTE — ED Provider Notes (Signed)
Patient placed in Quick Look pathway, seen and evaluated   Chief Complaint: shaking head and right arm   HPI:   Pt reports she began shaking last pm  ROS: no chest pain  Physical Exam:   Gen: No distress  Neuro: Awake and Alert  Skin: Warm    Focused Exam: Lungs clear heart rrr   Initiation of care has begun. The patient has been counseled on the process, plan, and necessity for staying for the completion/evaluation, and the remainder of the medical screening examination    Sidney Ace 01/31/21 1459    Carmin Muskrat, MD 02/01/21 [phone]

## 2021-01-31 NOTE — ED Notes (Signed)
Patient verbalizes understanding of discharge instructions. Opportunity for questioning and answers were provided. Armband removed by staff, pt discharged from ED via wheelchair.  

## 2021-01-31 NOTE — Telephone Encounter (Signed)
New Message:     Daughter said she just talked to the pt. She said was stating that her right arm and hand is trembling like someone that have Parkinson. She said pt speech also seems a little slurred.

## 2021-01-31 NOTE — Discharge Instructions (Signed)
Follow-up with your doctor for the tremor.

## 2021-01-31 NOTE — ED Triage Notes (Addendum)
Pt reports that she is here due to shakiness . Pt reports her cardiologist wanted her to be seen in the ED.Pt reports that her shakiness started last night. Pt reports no cp or sob.

## 2021-02-01 NOTE — Telephone Encounter (Signed)
Lmtcb for [employer].

## 2021-02-01 NOTE — Telephone Encounter (Signed)
Called patient, she did not answer. Called daughter, her phone rang once then went to VM. Left message for pt or daughter to call back.

## 2021-02-02 DIAGNOSIS — M1990 Unspecified osteoarthritis, unspecified site: Secondary | ICD-10-CM | POA: Diagnosis not present

## 2021-02-02 DIAGNOSIS — N183 Chronic kidney disease, stage 3 unspecified: Secondary | ICD-10-CM | POA: Diagnosis not present

## 2021-02-02 DIAGNOSIS — R251 Tremor, unspecified: Secondary | ICD-10-CM | POA: Diagnosis not present

## 2021-02-06 ENCOUNTER — Ambulatory Visit (INDEPENDENT_AMBULATORY_CARE_PROVIDER_SITE_OTHER): Payer: Medicare Other | Admitting: Physical Medicine and Rehabilitation

## 2021-02-06 ENCOUNTER — Encounter: Payer: Self-pay | Admitting: Physical Medicine and Rehabilitation

## 2021-02-06 VITALS — BP 149/74 | HR 86

## 2021-02-06 DIAGNOSIS — M797 Fibromyalgia: Secondary | ICD-10-CM | POA: Diagnosis not present

## 2021-02-06 DIAGNOSIS — G894 Chronic pain syndrome: Secondary | ICD-10-CM | POA: Diagnosis not present

## 2021-02-06 DIAGNOSIS — M5441 Lumbago with sciatica, right side: Secondary | ICD-10-CM

## 2021-02-06 DIAGNOSIS — G8929 Other chronic pain: Secondary | ICD-10-CM

## 2021-02-06 DIAGNOSIS — M5416 Radiculopathy, lumbar region: Secondary | ICD-10-CM | POA: Diagnosis not present

## 2021-02-06 DIAGNOSIS — M47816 Spondylosis without myelopathy or radiculopathy, lumbar region: Secondary | ICD-10-CM | POA: Diagnosis not present

## 2021-02-06 DIAGNOSIS — M48061 Spinal stenosis, lumbar region without neurogenic claudication: Secondary | ICD-10-CM | POA: Diagnosis not present

## 2021-02-06 NOTE — Telephone Encounter (Signed)
Lmtcb for [employer].

## 2021-02-06 NOTE — Progress Notes (Signed)
Patient states "it's her back and it runs down the sciatic nerve." Right lateral hip pain with pain radiating past right knee. Numeric Pain Rating Scale and Functional Assessment Average Pain 9   In the last MONTH (on 0-10 scale) has pain interfered with the following?  1. General activity like being  able to carry out your everyday physical activities such as walking, climbing stairs, carrying groceries, or moving a chair?  Rating(9)   +Driver, +BT, -Dye Allergies.

## 2021-02-08 ENCOUNTER — Ambulatory Visit: Payer: Medicare Other | Admitting: Physical Medicine and Rehabilitation

## 2021-02-08 NOTE — Telephone Encounter (Signed)
Lmtcb for [employer].

## 2021-02-10 ENCOUNTER — Other Ambulatory Visit: Payer: Self-pay | Admitting: Interventional Cardiology

## 2021-02-10 ENCOUNTER — Other Ambulatory Visit: Payer: Self-pay | Admitting: Physician Assistant

## 2021-02-13 NOTE — Telephone Encounter (Signed)
Per triage protocol, will close encounter due to several attempts to reach pt/Sheryl Curtis.

## 2021-02-14 ENCOUNTER — Other Ambulatory Visit: Payer: Self-pay | Admitting: Interventional Cardiology

## 2021-02-15 ENCOUNTER — Ambulatory Visit: Payer: Self-pay

## 2021-02-15 ENCOUNTER — Other Ambulatory Visit: Payer: Self-pay

## 2021-02-15 ENCOUNTER — Ambulatory Visit (INDEPENDENT_AMBULATORY_CARE_PROVIDER_SITE_OTHER): Payer: Medicare Other | Admitting: Physical Medicine and Rehabilitation

## 2021-02-15 ENCOUNTER — Encounter: Payer: Self-pay | Admitting: Physical Medicine and Rehabilitation

## 2021-02-15 VITALS — BP 115/59 | HR 99

## 2021-02-15 DIAGNOSIS — M5416 Radiculopathy, lumbar region: Secondary | ICD-10-CM | POA: Diagnosis not present

## 2021-02-15 MED ORDER — METHYLPREDNISOLONE ACETATE 80 MG/ML IJ SUSP
40.0000 mg | Freq: Once | INTRAMUSCULAR | Status: AC
  Administered 2021-02-15: 40 mg

## 2021-02-15 NOTE — Progress Notes (Signed)
Pt state lower back pain tht travels down her right leg to the ankle. Pt state walking and standing makes the pain worse. Pt state she take pain meds and uses heating pads to help ease her pain.  Numeric Pain Rating Scale and Functional Assessment Average Pain 10   In the last MONTH (on 0-10 scale) has pain interfered with the following?  1. General activity like being  able to carry out your everyday physical activities such as walking, climbing stairs, carrying groceries, or moving a chair?  Rating(10)   +Driver, +BT, -Dye Allergies.

## 2021-02-15 NOTE — Patient Instructions (Signed)

## 2021-02-20 ENCOUNTER — Other Ambulatory Visit: Payer: Self-pay | Admitting: Pulmonary Disease

## 2021-02-20 DIAGNOSIS — J4530 Mild persistent asthma, uncomplicated: Secondary | ICD-10-CM

## 2021-02-22 NOTE — Patient Outreach (Addendum)
[facility]) Care Management  02/22/2021  Sheryl Curtis 1932/01/10 [id number]   RN Health Coach telephone call to patient.  [employer] compliance verified. Per patient she is in a lot of pain with her back radiating down her leg. She has a wheelchair, but the batteries are dead and won't recharge. The chair is 85 yrs old and [employer] where it was purchased can not order new batteries since patient is eligible for a new chair. Patient can't afford a new chair and can't afford to purchase the replacement batteries. It takes two batteries.  Plan: Referred to social worker RN will follow up within the month of May  [name] [facility] [phone]

## 2021-02-23 ENCOUNTER — Other Ambulatory Visit: Payer: Self-pay | Admitting: *Deleted

## 2021-02-23 DIAGNOSIS — J9611 Chronic respiratory failure with hypoxia: Secondary | ICD-10-CM | POA: Diagnosis not present

## 2021-03-07 ENCOUNTER — Ambulatory Visit: Payer: Medicare Other | Admitting: Interventional Cardiology

## 2021-03-09 ENCOUNTER — Encounter: Payer: Self-pay | Admitting: Physical Medicine and Rehabilitation

## 2021-03-09 NOTE — Progress Notes (Signed)
Sheryl Curtis - 87 y.o. female MRN [id number]  Date of birth: 1932-08-25  Office Visit Note: Visit Date: 02/06/2021 PCP: Alroy Dust, L.Marlou Sa, MD Referred by: Alroy Dust, L.Marlou Sa, MD  Subjective: Chief Complaint  Patient presents with  . Lower Back - Pain  . Right Hip - Pain   HPI: Sheryl Curtis is a 87 y.o. female who comes in today For evaluation management of chronic worsening severe right hip and leg pain consistent with an L5 radicular pain or radiculitis radiculopathy.  No left-sided complaints.  Patient is well-known to [place] through Dr. Jean Rosenthal and she continues to seealso followed from a chronic pain management standpoint by Leeroy Cha, MD with a history of multiple joint pain, osteoarthritis, foraminal stenosis and fibromyalgia.  The last time I saw her for her lumbar spine was in December and we completed interlaminar epidural injection that did seem to help for a while.  She continues to refer as this pain is her sciatic nerve and we have gone over this multiple times that we do think this is probably an L5 radicular pain and I shown her on the anatomy chart as well as models and imaging.  Her last MRI was in 2017.  She has a complicated course of fibromyalgia with multiple medication intolerances and allergies as well as being on Plavix anticoagulation.  She reports no new falls or trauma.  Pain is going down the right leg with paresthesia.  No focal weakness.  She is tried and failed all manner of conservative care in the past including multiple rounds of physical therapy home exercise medications that she does not tolerate and chronic pain management.  Injections have helped to some degree off and on.  Review of Systems  Musculoskeletal: Positive for back pain and joint pain.       Right hip and leg pain  All other systems reviewed and are negative.  Otherwise per HPI.  Assessment & Plan: Visit Diagnoses:    ICD-10-CM   1. Lumbar radiculopathy  M54.16   2.  Spondylosis without myelopathy or radiculopathy, lumbar region  M47.816   3. Foraminal stenosis of lumbar region  M48.061   4. Chronic bilateral low back pain with right-sided sciatica  M54.41    G89.29   5. Chronic pain syndrome  G89.4   6. Fibromyalgia  M79.7      Plan: Findings:  53-month history of progressive right hip and leg pain consistent with a right L5 radiculopathy with decreased sensation or paresthesia in L5 dermatome with negative straight leg raise but a history of severe stenosis in the foramen Duda arthritic changes and degenerative disc changes.  Foraminal narrowing at L4 and L5 but symptoms more classically L5.  Based on the amount of pain that she is having and the fact that she is on anticoagulation I can get her in quickly for diagnostic and therapeutic L5 transforaminal injection.  If that injection is difficult or cannot be done very well do the stenosis we can get her off the blood thinner and do an interlaminar approach.  She will continue to follow with Dr. Ranell Patrick.  At the age of 32 she is obviously not a surgical candidate.    Meds & Orders: No orders of the defined types were placed in this encounter.  No orders of the defined types were placed in this encounter.   Follow-up: Return for Right L5 transforaminal epidural steroid injection.   Procedures: No procedures performed      Clinical History:  MRI CERVICAL SPINE WITHOUT CONTRAST  TECHNIQUE: Multiplanar, multisequence MR imaging of the cervical spine was performed. No intravenous contrast was administered.  COMPARISON:  04/19/2020 cervical spine radiographs.  FINDINGS: Please note that motion artifact limits evaluation.  Alignment: Mild reversal of cervical lordosis. Minimal grade 1 C2-3 anterolisthesis. Stepwise grade 1 C5-6 and C6-7 retrolisthesis.  Vertebrae: Normal bone marrow signal intensity. No focal osseous lesion.  Cord: Normal signal and morphology.  Posterior Fossa, vertebral  arteries: Negative.  Disc levels: Multilevel desiccation. Mild C3-4 and moderate C5-7 disc space loss.  C2-3: Small central protrusion with uncovertebral and bilateral facet hypertrophy. Mild left neural foraminal narrowing.  C3-4: Disc osteophyte complex with superimposed central protrusion abutting the ventral cord, uncovertebral and bilateral facet hypertrophy. Mild spinal canal and bilateral neural foraminal narrowing.  C4-5: Disc osteophyte complex with superimposed left paracentral protrusion abutting the ventral cord, left predominant uncovertebral and facet hypertrophy. Mild spinal canal and mild left greater than right neural foraminal narrowing.  C5-6: Disc osteophyte complex abutting the ventral cord with superimposed left subarticular/foraminal protrusion, uncovertebral and bilateral facet hypertrophy. Mild to moderate spinal canal and moderate bilateral neural foraminal narrowing.  C6-7: Disc osteophyte complex abutting the ventral cord with small superimposed left foraminal protrusion, uncovertebral and bilateral facet hypertrophy. Moderate spinal canal and moderate to severe bilateral neural foraminal narrowing.  C7-T1: Tiny central protrusion. No significant spinal canal or neural foraminal narrowing.  Paraspinal tissues: Within normal limits.  IMPRESSION: Moderate to severe bilateral C5-6 and C6-7 neural foraminal narrowing.  Mild to moderate C5-6 and moderate C6-7 spinal canal narrowing.  Mild left C2-3 and bilateral C3-5 neural foraminal narrowing.   Electronically Signed   By: Primitivo Gauze M.D.   On: 05/18/2020 08:30 ---- MRI LUMBAR SPINE WITHOUT CONTRAST    COMPARISON: MRI lumbar spine July 01, 2015    FINDINGS:     VERTEBRAE:Vertebral bodies are intact. Similar severe L3-4, L4-5 and  L5-S1 disc height loss, associated with levoscoliosis. Severe T11-12  and T12-L1 disc height loss associated with dextroscoliosis.   Decreased T2 signal within all disc compatible with desiccation with  multilevel vacuum disc. Moderate lower lumbar subacute on chronic  discogenic endplate changes. No bone marrow signal abnormality to  suggest acute osseous process.     PARASPINAL AND SOFT TISSUES: Included prevertebral and paraspinal  soft tissues are nonacute. Moderate to severe symmetric paraspinal  muscle atrophy. Stable renal cysts measuring up to 15 mm on the  RIGHT.    DISC LEVELS:     L3-4: Small broad-based disc bulge, moderate RIGHT subarticular disc  protrusion. Mild facet arthropathy and ligamentum flavum redundancy.  No canal stenosis though partial chronic effacement RIGHT lateral  recess could affect the traversing RIGHT L4 nerve. Moderate to  severe RIGHT, mild LEFT neural foraminal narrowing.    L4-5: Stable moderate broad-based disc bulge asymmetric to the  RIGHT. Moderate RIGHT and mild LEFT facet arthropathy with  ligamentum flavum redundancy. No canal stenosis. Severe RIGHT,  moderate LEFT neural foraminal narrowing.    L5-S1: Moderate broad-based disc bulge. Mild to moderate facet  arthropathy and ligamentum flavum redundancy without canal stenosis.  Moderate to severe RIGHT, severe LEFT neural foraminal narrowing.    IMPRESSION:    No canal stenosis. Neural foraminal narrowing at all lumbar levels:  Severe on the RIGHT at L4-5 and severe on the LEFT at L5-S1.    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   By: Elon Alas M.D.  On:  08/22/2016 03:14   She reports that she quit smoking about 58 years ago. Her smoking use included cigarettes. She started smoking about 71 years ago. She has a 12.00 pack-year smoking history. She has never used smokeless tobacco. No results for input(s): HGBA1C, LABURIC in the last 8760 hours.  Objective:  VS:  HT:    WT:   BMI:     BP:(!) 149/74  HR:86bpm  TEMP: ( )  RESP:  Physical Exam Vitals and  nursing note reviewed.  Constitutional:      General: She is not in acute distress.    Appearance: Normal appearance. She is obese. She is not ill-appearing.  HENT:     Head: Normocephalic and atraumatic.     Right Ear: External ear normal.     Left Ear: External ear normal.  Eyes:     Extraocular Movements: Extraocular movements intact.  Cardiovascular:     Rate and Rhythm: Normal rate.     Pulses: Normal pulses.  Pulmonary:     Effort: Pulmonary effort is normal. No respiratory distress.  Abdominal:     General: There is no distension.     Palpations: Abdomen is soft.  Musculoskeletal:        General: Tenderness present.     Cervical back: Neck supple.     Right lower leg: No edema.     Left lower leg: No edema.     Comments: Patient has good distal strength with No clonus or focal weakness.  She does have pain over both greater trochanters and PSIS and lower back with positive tender points.  No focal trigger points noted.  No pain with hip rotation.  She does have concordant pain with facet loading of the lower spine.  Dysesthesia in L5 dermatome on the right.  Skin:    Findings: No erythema, lesion or rash.  Neurological:     General: No focal deficit present.     Mental Status: She is alert and oriented to person, place, and time.     Sensory: No sensory deficit.     Motor: No weakness or abnormal muscle tone.     Coordination: Coordination normal.  Psychiatric:        Mood and Affect: Mood normal.        Behavior: Behavior normal.     Ortho Exam  Imaging: No results found.  Past Medical/Family/Surgical/Social History: Medications & Allergies reviewed per EMR, new medications updated. Patient Active Problem List   Diagnosis Date Noted  . Chronic respiratory failure with hypoxia ([place]) 01/18/2021  . Chronic diastolic heart failure ([place]) 01/18/2021  . Medication management 09/18/2020  . Healthcare maintenance 09/18/2020  . Mild persistent asthma without  complication [id number]  . Bilateral sensorineural hearing loss 09/17/2019  . Dysphagia 06/14/2019  . Neck pain, chronic 04/21/2019  . Physical deconditioning 12/09/2018  . CKD (chronic kidney disease)   . Morbid obesity ([place])   . Mild persistent asthma with (acute) exacerbation 07/16/2018  . Cough 07/16/2018  . OSA (obstructive sleep apnea) 07/30/2017  . Trochanteric bursitis, right hip 04/21/2017  . Nocturnal hypoxia 03/31/2017  . Sinus bradycardia 08/05/2016  . Hypertensive urgency 08/03/2016  . Asthma 02/19/2016  . Chronic kidney disease (CKD), stage III (moderate) ([place]) 07/05/2015  . CAFL (chronic airflow limitation) ([place]) 07/05/2015  . Generalized OA 07/05/2015  . 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 H/O malignant neoplasm of colon 07/05/2015  . Elevated troponin 07/02/2015  . UTI (lower urinary tract infection) 07/01/2015  . Fibromyalgia  syndrome 07/01/2015  . Hyponatremia 07/01/2015  . Urinary tract infectious disease   . Pulmonary nodules/lesions, multiple 04/11/2014  . Abdominal pain, generalized 04/07/2014  . Coronary atherosclerosis of native coronary artery 10/12/2013  . Essential hypertension, benign   . Other and unspecified angina pectoris   . Atrial fibrillation ([place]) 04/19/2010  . Allergic rhinitis 04/19/2010  . DYSPNEA 04/19/2010  . CHEST PAIN, ATYPICAL 04/19/2010  . CANCER, COLORECTAL 04/18/2010  . Essential hypertension 04/18/2010  . G E R D 04/18/2010  . Fibromyalgia 04/18/2010  . INSOMNIA 04/18/2010   Past Medical History:  Diagnosis Date  . ALLERGIC RHINITIS   . Atrial fibrillation ([place])   . CANCER, COLORECTAL   . CHEST PAIN, ATYPICAL   . CKD (chronic kidney disease)   . DYSPNEA   . Essential hypertension, benign   . FIBROMYALGIA   . G E R D   . History of stress test    Myoview 8/16:  EF 69%, anterior and apical defect c/w breast attenuation; Low Risk   . INSOMNIA   . Morbid obesity ([place])   . Other and unspecified angina pectoris    . Sleep apnea    Family History  Problem Relation Age of Onset  . CAD Brother   . Heart attack Brother 92  . Bone cancer Sister   . Throat cancer Daughter    Past Surgical History:  Procedure Laterality Date  . ABDOMINAL HYSTERECTOMY    . ANKLE FRACTURE SURGERY    . BACK SURGERY    . BREAST SURGERY    . COLON SURGERY    . CORONARY STENT PLACEMENT    . PERCUTANEOUS CORONARY STENT INTERVENTION (PCI-S) N/A 05/19/2013   Procedure: PERCUTANEOUS CORONARY STENT INTERVENTION (PCI-S);  Surgeon: Jettie Booze, MD;  Location: [facility] CATH LAB;  Service: Cardiovascular;  Laterality: N/A;   Social History   Occupational History  . Not on file  Tobacco Use  . Smoking status: Former Smoker    Packs/day: 1.00    Years: 12.00    Pack years: 12.00    Types: Cigarettes    Start date: 10/28/1949    Quit date: 10/28/1962    Years since quitting: 58.4  . Smokeless tobacco: Never Used  Vaping Use  . Vaping Use: Never used  Substance and Sexual Activity  . Alcohol use: No  . Drug use: No  . Sexual activity: Not on file

## 2021-03-09 NOTE — Progress Notes (Signed)
Sheryl Curtis - 87 y.o. female MRN [id number]  Date of birth: [birth year]  Office Visit Note: Visit Date: 02/15/2021 PCP: Alroy Dust, L.Marlou Sa, MD Referred by: Alroy Dust, L.Marlou Sa, MD  Subjective: Chief Complaint  Patient presents with  . Lower Back - Pain  . Right Leg - Pain  . Right Ankle - Pain   HPI:  Sheryl Curtis is a 87 y.o. female who comes in today  for planned Right L5-S1 Lumbar Transforaminal epidural steroid injection with fluoroscopic guidance.  The patient has failed conservative care including home exercise, medications, time and activity modification.  This injection will be diagnostic and hopefully therapeutic.  Please see requesting physician notes for further details and justification.   ROS Otherwise per HPI.  Assessment & Plan: Visit Diagnoses:    ICD-10-CM   1. Lumbar radiculopathy  M54.16 XR C-ARM NO REPORT    Epidural Steroid injection    methylPREDNISolone acetate (DEPO-MEDROL) injection 40 mg    Plan: No additional findings.   Meds & Orders:  Meds ordered this encounter  Medications  . methylPREDNISolone acetate (DEPO-MEDROL) injection 40 mg    Orders Placed This Encounter  Procedures  . XR C-ARM NO REPORT  . Epidural Steroid injection    Follow-up: Return if symptoms worsen or fail to improve.   Procedures: No procedures performed  Lumbosacral Transforaminal Epidural Steroid Injection - Sub-Pedicular Approach with Fluoroscopic Guidance  Patient: Sheryl Curtis      Date of Birth: December 08, 1931 MRN: [id number] PCP: Alroy Dust, L.Marlou Sa, MD      Visit Date: 02/15/2021   Universal Protocol:    Date/Time: 02/15/2021  Consent Given By: the patient  Position: PRONE  Additional Comments: Vital signs were monitored before and after the procedure. Patient was prepped and draped in the usual sterile fashion. The correct patient, procedure, and site was verified.   Injection Procedure Details:   Procedure diagnoses: Lumbar radiculopathy [M54.16]     Meds Administered:  Meds ordered this encounter  Medications  . methylPREDNISolone acetate (DEPO-MEDROL) injection 40 mg    Laterality: Right  Location/Site:  L5-S1  Needle:5.0 in., 22 ga.  Short bevel or Quincke spinal needle  Needle Placement: Transforaminal  Findings:    -Comments: Patient had quite a bit of intolerance and we got near the foramen and it was a very difficult injection from the trajectory of the needle to get to the foramen given her body habitus and anatomy.  We were able to get flow of contrast right at the foramen and around the nerve root.  Saved images were blurry because the patient was moving do to discomfort.  She did well afterwards however.  Consider interlaminar approach.  Procedure Details: After squaring off the end-plates to get a true AP view, the C-arm was positioned so that an oblique view of the foramen as noted above was visualized. The target area is just inferior to the "nose of the scotty dog" or sub pedicular. The soft tissues overlying this structure were infiltrated with 2-3 ml. of 1% Lidocaine without Epinephrine.  The spinal needle was inserted toward the target using a "trajectory" view along the fluoroscope beam.  Under AP and lateral visualization, the needle was advanced so it did not puncture dura and was located close the 6 O'Clock position of the pedical in AP tracterory. Biplanar projections were used to confirm position. Aspiration was confirmed to be negative for CSF and/or blood. A 1-2 ml. volume of Isovue-250 was injected and flow of contrast was noted at  each level. Radiographs were obtained for documentation purposes.   After attaining the desired flow of contrast documented above, a 0.5 to 1.0 ml test dose of 0.25% Marcaine was injected into each respective transforaminal space.  The patient was observed for 90 seconds post injection.  After no sensory deficits were reported, and normal lower extremity motor function was noted,    the above injectate was administered so that equal amounts of the injectate were placed at each foramen (level) into the transforaminal epidural space.   Additional Comments:  No complications occurred Dressing: 2 x 2 sterile gauze and Band-Aid    Post-procedure details: Patient was observed during the procedure. Post-procedure instructions were reviewed.  Patient left the clinic in stable condition.      Clinical History: MRI CERVICAL SPINE WITHOUT CONTRAST  TECHNIQUE: Multiplanar, multisequence MR imaging of the cervical spine was performed. No intravenous contrast was administered.  COMPARISON:  04/19/2020 cervical spine radiographs.  FINDINGS: Please note that motion artifact limits evaluation.  Alignment: Mild reversal of cervical lordosis. Minimal grade 1 C2-3 anterolisthesis. Stepwise grade 1 C5-6 and C6-7 retrolisthesis.  Vertebrae: Normal bone marrow signal intensity. No focal osseous lesion.  Cord: Normal signal and morphology.  Posterior Fossa, vertebral arteries: Negative.  Disc levels: Multilevel desiccation. Mild C3-4 and moderate C5-7 disc space loss.  C2-3: Small central protrusion with uncovertebral and bilateral facet hypertrophy. Mild left neural foraminal narrowing.  C3-4: Disc osteophyte complex with superimposed central protrusion abutting the ventral cord, uncovertebral and bilateral facet hypertrophy. Mild spinal canal and bilateral neural foraminal narrowing.  C4-5: Disc osteophyte complex with superimposed left paracentral protrusion abutting the ventral cord, left predominant uncovertebral and facet hypertrophy. Mild spinal canal and mild left greater than right neural foraminal narrowing.  C5-6: Disc osteophyte complex abutting the ventral cord with superimposed left subarticular/foraminal protrusion, uncovertebral and bilateral facet hypertrophy. Mild to moderate spinal canal and moderate bilateral neural foraminal  narrowing.  C6-7: Disc osteophyte complex abutting the ventral cord with small superimposed left foraminal protrusion, uncovertebral and bilateral facet hypertrophy. Moderate spinal canal and moderate to severe bilateral neural foraminal narrowing.  C7-T1: Tiny central protrusion. No significant spinal canal or neural foraminal narrowing.  Paraspinal tissues: Within normal limits.  IMPRESSION: Moderate to severe bilateral C5-6 and C6-7 neural foraminal narrowing.  Mild to moderate C5-6 and moderate C6-7 spinal canal narrowing.  Mild left C2-3 and bilateral C3-5 neural foraminal narrowing.   Electronically Signed   By: Primitivo Gauze M.D.   On: 05/18/2020 08:30 ---- MRI LUMBAR SPINE WITHOUT CONTRAST    COMPARISON: MRI lumbar spine July 01, 2015    FINDINGS:     VERTEBRAE:Vertebral bodies are intact. Similar severe L3-4, L4-5 and  L5-S1 disc height loss, associated with levoscoliosis. Severe T11-12  and T12-L1 disc height loss associated with dextroscoliosis.  Decreased T2 signal within all disc compatible with desiccation with  multilevel vacuum disc. Moderate lower lumbar subacute on chronic  discogenic endplate changes. No bone marrow signal abnormality to  suggest acute osseous process.     PARASPINAL AND SOFT TISSUES: Included prevertebral and paraspinal  soft tissues are nonacute. Moderate to severe symmetric paraspinal  muscle atrophy. Stable renal cysts measuring up to 15 mm on the  RIGHT.    DISC LEVELS:     L3-4: Small broad-based disc bulge, moderate RIGHT subarticular disc  protrusion. Mild facet arthropathy and ligamentum flavum redundancy.  No canal stenosis though partial chronic effacement RIGHT lateral  recess could affect the traversing RIGHT L4  nerve. Moderate to  severe RIGHT, mild LEFT neural foraminal narrowing.    L4-5: Stable moderate broad-based disc bulge asymmetric to the  RIGHT. Moderate RIGHT and mild LEFT  facet arthropathy with  ligamentum flavum redundancy. No canal stenosis. Severe RIGHT,  moderate LEFT neural foraminal narrowing.    L5-S1: Moderate broad-based disc bulge. Mild to moderate facet  arthropathy and ligamentum flavum redundancy without canal stenosis.  Moderate to severe RIGHT, severe LEFT neural foraminal narrowing.    IMPRESSION:    No canal stenosis. Neural foraminal narrowing at all lumbar levels:  Severe on the RIGHT at L4-5 and severe on the LEFT at L5-S1.    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   By: Elon Alas M.D.  On: 08/22/2016 03:14     Objective:  VS:  HT:    WT:   BMI:     BP:(!) 115/59  HR:99bpm  TEMP: ( )  RESP:  Physical Exam Vitals and nursing note reviewed.  Constitutional:      General: She is not in acute distress.    Appearance: Normal appearance. She is obese. She is not ill-appearing.  HENT:     Head: Normocephalic and atraumatic.     Right Ear: External ear normal.     Left Ear: External ear normal.  Eyes:     Extraocular Movements: Extraocular movements intact.  Cardiovascular:     Rate and Rhythm: Normal rate.     Pulses: Normal pulses.  Pulmonary:     Effort: Pulmonary effort is normal. No respiratory distress.  Abdominal:     General: There is no distension.     Palpations: Abdomen is soft.  Musculoskeletal:        General: Tenderness present.     Cervical back: Neck supple.     Right lower leg: No edema.     Left lower leg: No edema.     Comments: Patient has good distal strength with no pain over the greater trochanters.  No clonus or focal weakness.  Skin:    Findings: No erythema, lesion or rash.  Neurological:     General: No focal deficit present.     Mental Status: She is alert and oriented to person, place, and time.     Sensory: No sensory deficit.     Motor: No weakness or abnormal muscle tone.     Coordination: Coordination normal.  Psychiatric:        Mood  and Affect: Mood normal.        Behavior: Behavior normal.      Imaging: No results found.

## 2021-03-09 NOTE — Procedures (Signed)
Lumbosacral Transforaminal Epidural Steroid Injection - Sub-Pedicular Approach with Fluoroscopic Guidance  Patient: Sheryl Curtis      Date of Birth: 1932/07/26 MRN: [id number] PCP: Alroy Dust, L.Marlou Sa, MD      Visit Date: 02/15/2021   Universal Protocol:    Date/Time: 02/15/2021  Consent Given By: the patient  Position: PRONE  Additional Comments: Vital signs were monitored before and after the procedure. Patient was prepped and draped in the usual sterile fashion. The correct patient, procedure, and site was verified.   Injection Procedure Details:   Procedure diagnoses: Lumbar radiculopathy [M54.16]    Meds Administered:  Meds ordered this encounter  Medications  . methylPREDNISolone acetate (DEPO-MEDROL) injection 40 mg    Laterality: Right  Location/Site:  L5-S1  Needle:5.0 in., 22 ga.  Short bevel or Quincke spinal needle  Needle Placement: Transforaminal  Findings:    -Comments: Patient had quite a bit of intolerance and we got near the foramen and it was a very difficult injection from the trajectory of the needle to get to the foramen given her body habitus and anatomy.  We were able to get flow of contrast right at the foramen and around the nerve root.  Saved images were blurry because the patient was moving do to discomfort.  She did well afterwards however.  Consider interlaminar approach.  Procedure Details: After squaring off the end-plates to get a true AP view, the C-arm was positioned so that an oblique view of the foramen as noted above was visualized. The target area is just inferior to the "nose of the scotty dog" or sub pedicular. The soft tissues overlying this structure were infiltrated with 2-3 ml. of 1% Lidocaine without Epinephrine.  The spinal needle was inserted toward the target using a "trajectory" view along the fluoroscope beam.  Under AP and lateral visualization, the needle was advanced so it did not puncture dura and was located close the  6 O'Clock position of the pedical in AP tracterory. Biplanar projections were used to confirm position. Aspiration was confirmed to be negative for CSF and/or blood. A 1-2 ml. volume of Isovue-250 was injected and flow of contrast was noted at each level. Radiographs were obtained for documentation purposes.   After attaining the desired flow of contrast documented above, a 0.5 to 1.0 ml test dose of 0.25% Marcaine was injected into each respective transforaminal space.  The patient was observed for 90 seconds post injection.  After no sensory deficits were reported, and normal lower extremity motor function was noted,   the above injectate was administered so that equal amounts of the injectate were placed at each foramen (level) into the transforaminal epidural space.   Additional Comments:  No complications occurred Dressing: 2 x 2 sterile gauze and Band-Aid    Post-procedure details: Patient was observed during the procedure. Post-procedure instructions were reviewed.  Patient left the clinic in stable condition.

## 2021-03-21 ENCOUNTER — Other Ambulatory Visit: Payer: Self-pay | Admitting: *Deleted

## 2021-03-21 NOTE — Patient Instructions (Signed)
Goals Addressed            This Visit's Progress   . (THN)Track and Manage My Blood Pressure-Hypertension   Not on track    Timeframe:  Short-Term Goal Priority:  High Start Date:12202021                             Expected End Date:  [id number]                     Follow Up Date [id number]   - check blood pressure weekly - choose a place to take my blood pressure (home, clinic or office, retail store) - write blood pressure results in a log or diary    Why is this important?    You won't feel high blood pressure, but it can still hurt your blood vessels.   High blood pressure can cause heart or kidney problems. It can also cause a stroke.   Making lifestyle changes like losing a little weight or eating less salt will help.   Checking your blood pressure at home and at different times of the day can help to control blood pressure.   If the doctor prescribes medicine remember to take it the way the doctor ordered.   Call the office if you cannot afford the medicine or if there are questions about it.     Notes:  RN sent new calendar book for documentation [id number] Patient has not been monitoring at home.  BP checked at Dr office.    . Lifestyle Change-Hypertension   Not on track    Timeframe:  Long-Range Goal Priority:  Medium Start Date:   [id number]                          Expected End Date:   38756433        Follow Up Date [id number]   - agree to work together to make changes - ask questions to understand - learn about high blood pressure    Why is this important?    The changes that you are asked to make may be hard to do.   This is especially true when the changes are life-long.   Knowing why it is important to you is the first step.   Working on the change with your family or support person helps you not feel alone.   Reward yourself and family or support person when goals are met. This can be an activity you choose like bowling, hiking, biking, swimming or  shooting hoops.     Notes:  [id number] Patient has not made any additional changes. She is having severe back and leg pain.

## 2021-03-21 NOTE — Patient Outreach (Signed)
[facility]) Care Management  [place]  03/21/2021   Sheryl Curtis 05/29/1932 [id number]  RN Health Coach telephone call to patient.  [employer] compliance verified. Per patient she has not been monitoring her blood pressure at home. Patient stated she has been having a lot of sciatic pain radiating down her leg and stays in the bed a lot. Patient stated she needs a motorized chair to get around because the pain is making it unable to get around. The company that the patient she brought the chair from will not replace the battery because the chair is over 63 years old. The guidelines state it is time for the motorized chair to be replaced.. Patient has not had any recent falls. Patient has agreed to further outreach calls.  Encounter Medications:  Outpatient Encounter Medications as of 03/21/2021  Medication Sig Note  . acetaminophen (TYLENOL) 650 MG CR tablet Take 650 mg by mouth every 8 (eight) hours as needed for pain.    Marland Kitchen albuterol (PROVENTIL) (2.5 MG/3ML) 0.083% nebulizer solution USE 1 VIAL VIA NEBULIZER EVERY 6 HOURS AS NEEDED   . Albuterol Sulfate (PROAIR RESPICLICK) 710 (90 Base) MCG/ACT AEPB Inhale 1-2 puffs into the lungs every 6 (six) hours as needed (for shortness of breath and/or wheezing.).   Marland Kitchen ALPRAZolam (XANAX) 0.25 MG tablet Take 0.25 mg by mouth 3 (three) times daily as needed for anxiety.   Marland Kitchen amitriptyline (ELAVIL) 10 MG tablet Take 1 tablet (10 mg total) by mouth at bedtime as needed for sleep.   . Ascorbic Acid (VITAMIN C) 1000 MG tablet Take 1,000 mg by mouth daily.   . Budeson-Glycopyrrol-Formoterol (BREZTRI AEROSPHERE) 160-9-4.8 MCG/ACT AERO Inhale 2 puffs into the lungs in the morning and at bedtime.   . Budeson-Glycopyrrol-Formoterol (BREZTRI AEROSPHERE) 160-9-4.8 MCG/ACT AERO Inhale 2 puffs into the lungs in the morning and at bedtime.   . carbamide peroxide (DEBROX) 6.5 % OTIC solution Place 5 drops into both ears 2 (two) times daily.   .  Cholecalciferol (VITAMIN D) 50 MCG (2000 UT) tablet Take 2,000 Units by mouth daily.   . clopidogrel (PLAVIX) 75 MG tablet TAKE 1 TABLET(75 MG) BY MOUTH EVERY OTHER DAY (Patient taking differently: Take 75 mg by mouth daily.)   . diphenhydrAMINE (BENADRYL ALLERGY) 25 mg capsule Take 1 capsule (25 mg total) by mouth at bedtime as needed.   . docusate sodium (COLACE) 100 MG capsule Take 1 capsule (100 mg total) by mouth in the morning, at noon, and at bedtime.   . DULoxetine (CYMBALTA) 60 MG capsule Take 60 mg by mouth daily.   . furosemide (LASIX) 40 MG tablet TAKE 1 TABLET BY MOUTH  DAILY   . gabapentin (NEURONTIN) 300 MG capsule Take 1 capsule (300 mg total) by mouth at bedtime.   Marland Kitchen ipratropium (ATROVENT) 0.03 % nasal spray Place 2 sprays into both nostrils every 12 (twelve) hours.   . isosorbide mononitrate (IMDUR) 60 MG 24 hr tablet TAKE 1 TABLET BY MOUTH  DAILY   . lidocaine (LIDODERM) 5 % Place 1 patch onto the skin daily. Remove & Discard patch within 12 hours or as directed by MD   . morphine (MS CONTIN) 15 MG 12 hr tablet Take 1 tablet (15 mg total) by mouth every 12 (twelve) hours. 12/13/2020: Last dose: 12/04/2020, patient states that she does not like how it makes her feel  . Nebulizers (COMPRESSOR NEBULIZER) MISC 1 Device by Does not apply route once.   . nitroGLYCERIN (NITROSTAT)  0.4 MG SL tablet Place 1 tablet (0.4 mg total) under the tongue every 5 (five) minutes as needed for chest pain.   . Omega-3 Fatty Acids (FISH OIL) 1200 MG CAPS Take 1,200 mg by mouth daily.   Marland Kitchen oxyCODONE-acetaminophen (PERCOCET) 10-325 MG tablet Take 1 tablet by mouth 2 (two) times daily as needed for pain.   . polyethylene glycol (MIRALAX / GLYCOLAX) 17 g packet Take 17 g by mouth daily as needed for mild constipation.    . potassium chloride SA (KLOR-CON) 20 MEQ tablet TAKE 1 TABLET BY MOUTH  DAILY   . pregabalin (LYRICA) 25 MG capsule Take 1 capsule (25 mg total) by mouth 2 (two) times daily.   Marland Kitchen  Respiratory Therapy Supplies (FLUTTER) DEVI Use flutter device 3 times a day   . rosuvastatin (CRESTOR) 20 MG tablet TAKE 1 TABLET BY MOUTH  DAILY   . senna (SENOKOT) 8.6 MG TABS tablet Take 2 tablets (17.2 mg total) by mouth at bedtime.   . valsartan-hydrochlorothiazide (DIOVAN-HCT) 160-12.5 MG tablet Take 1 tablet by mouth daily.    No facility-administered encounter medications on file as of 03/21/2021.    Functional Status:  No flowsheet data found.  Fall/Depression Screening: Fall Risk  03/21/2021 12/13/2020 11/01/2020  Falls in the past year? 1 0 0  Comment - - -  Number falls in past yr: 1 - 0  Injury with Fall? 1 - 0  Comment - - -  Risk Factor Category  - - -  Risk for fall due to : History of fall(s);Impaired balance/gait;Impaired mobility - -  Risk for fall due to: Comment - - -  Follow up Falls evaluation completed - -  Comment - - -   PHQ 2/9 Scores 11/01/2020 05/03/2020 03/07/2020 01/21/2020 10/25/2019 07/22/2019 04/21/2019  PHQ - 2 Score 2 0 0 2 0 0 0  PHQ- 9 Score - - - 5 - - -  Exception Documentation - - - - - - -    Assessment:  Goals Addressed            This Visit's Progress   . (THN)Track and Manage My Blood Pressure-Hypertension   Not on track    Timeframe:  Short-Term Goal Priority:  High Start Date:12202021                             Expected End Date:  [id number]                     Follow Up Date [id number]   - check blood pressure weekly - choose a place to take my blood pressure (home, clinic or office, retail store) - write blood pressure results in a log or diary    Why is this important?    You won't feel high blood pressure, but it can still hurt your blood vessels.   High blood pressure can cause heart or kidney problems. It can also cause a stroke.   Making lifestyle changes like losing a little weight or eating less salt will help.   Checking your blood pressure at home and at different times of the day can help to control blood pressure.    If the doctor prescribes medicine remember to take it the way the doctor ordered.   Call the office if you cannot afford the medicine or if there are questions about it.     Notes:  RN sent new calendar book  for documentation [id number] Patient has not been monitoring at home.  BP checked at Dr office.    . Lifestyle Change-Hypertension   Not on track    Timeframe:  Long-Range Goal Priority:  Medium Start Date:   [id number]                          Expected End Date:   27741287        Follow Up Date [id number]   - agree to work together to make changes - ask questions to understand - learn about high blood pressure    Why is this important?    The changes that you are asked to make may be hard to do.   This is especially true when the changes are life-long.   Knowing why it is important to you is the first step.   Working on the change with your family or support person helps you not feel alone.   Reward yourself and family or support person when goals are met. This can be an activity you choose like bowling, hiking, biking, swimming or shooting hoops.     Notes:  [id number] Patient has not made any additional changes. She is having severe back and leg pain.       Plan:  Follow-up:  Patient agrees to Care Plan and Follow-up. RN ordered free government Covid testing kits for patient RN notified PCP for order on new motorized chair to be sent in RN sent update assessment to PCP RN will follow up outreach in the month of July  Lael Wetherbee [facility] [phone]

## 2021-03-23 ENCOUNTER — Telehealth: Payer: Self-pay

## 2021-03-23 NOTE — Telephone Encounter (Signed)
patients daughter called she is requesting a appointment regarding patients sciatica back pain [phone]

## 2021-03-25 DIAGNOSIS — J9611 Chronic respiratory failure with hypoxia: Secondary | ICD-10-CM | POA: Diagnosis not present

## 2021-03-27 NOTE — Telephone Encounter (Signed)
Either right L4 tf esi if right hip and leg or OV if anything else

## 2021-03-27 NOTE — Telephone Encounter (Signed)
Right L5 TF 02/15/21. Ok to repeat if helped, same problem/side, and no new injury?

## 2021-03-27 NOTE — Telephone Encounter (Signed)
FYI- patient's daughter states that the pain is still in the "sciatic nerve" in right hip and leg. I offered an appointment for an injection as you advised, but the daughter states that "the last 2 have not helped, why would another help?" I offered an office visit to discuss. The patient daughter asked the patient, and she declined injection and OV. The daughter states that they spoke with you about a procedure to "deaden the nerves" and that you said we could try that if the injections did not help. The patient, however, does not want that either.

## 2021-03-28 ENCOUNTER — Other Ambulatory Visit: Payer: Self-pay | Admitting: Interventional Cardiology

## 2021-04-02 DIAGNOSIS — M797 Fibromyalgia: Secondary | ICD-10-CM | POA: Diagnosis not present

## 2021-04-02 DIAGNOSIS — M1990 Unspecified osteoarthritis, unspecified site: Secondary | ICD-10-CM | POA: Diagnosis not present

## 2021-04-02 DIAGNOSIS — G629 Polyneuropathy, unspecified: Secondary | ICD-10-CM | POA: Diagnosis not present

## 2021-04-23 ENCOUNTER — Telehealth: Payer: Self-pay | Admitting: Orthopaedic Surgery

## 2021-04-23 DIAGNOSIS — M5440 Lumbago with sciatica, unspecified side: Secondary | ICD-10-CM

## 2021-04-23 DIAGNOSIS — M5416 Radiculopathy, lumbar region: Secondary | ICD-10-CM

## 2021-04-23 NOTE — Telephone Encounter (Signed)
Patient is requesting a referral to Dr. Holley Raring in [place]. Ok to place referral?

## 2021-04-23 NOTE — Telephone Encounter (Signed)
Pt daughter called and states her mother would like to get a referral to a different orthopedic in [place] named Dr.Lateef [phone].   CB [phone]

## 2021-04-23 NOTE — Telephone Encounter (Signed)
About her mothers back. I am sorry

## 2021-04-23 NOTE — Telephone Encounter (Signed)
She states its about her daughters back so its actually [place]. She states they said she needs referral.   Please advise last message

## 2021-04-24 NOTE — Telephone Encounter (Signed)
Referral placed and patient's daughter notified.

## 2021-04-24 NOTE — Addendum Note (Signed)
Addended by: Sherre Scarlet B on: 04/24/2021 09:20 AM   Modules accepted: Orders

## 2021-04-25 DIAGNOSIS — J9611 Chronic respiratory failure with hypoxia: Secondary | ICD-10-CM | POA: Diagnosis not present

## 2021-05-07 ENCOUNTER — Other Ambulatory Visit: Payer: Self-pay | Admitting: *Deleted

## 2021-05-07 NOTE — Patient Outreach (Signed)
[facility]) Care Management  05/07/2021  KARAN INCLAN 13-May-1932 [id number]   RN Health Coach telephone call to patient.  Hip[employer] compliance verified. Per patient she has not received her motorize chair. She stated they had measured her for the chair but had not received it. Patient does not know who the chair was ordered from. RN called Dr Alroy Dust nurse to obtain the name of the company the power chair was ordered from.  Plan: Awaiting call back for DR office for the company name  [facility] [phone]

## 2021-05-10 ENCOUNTER — Other Ambulatory Visit: Payer: Self-pay | Admitting: *Deleted

## 2021-05-10 NOTE — Patient Outreach (Signed)
[facility]) Care Management  05/10/2021  Bertram Millard WAYNESHA RAMMEL 11/01/1931 [id number]  RN received return call from Dr office regarding power chair. RN given a contact person for [employer]. RN Health Coach contacted [employer].  RN notified patient of contact person and the reason she has not received chair.  Plan: Patient and family will follow up with [employer] if further questions.  [facility] [phone]

## 2021-05-13 ENCOUNTER — Other Ambulatory Visit: Payer: Self-pay | Admitting: Physician Assistant

## 2021-05-25 DIAGNOSIS — J9611 Chronic respiratory failure with hypoxia: Secondary | ICD-10-CM | POA: Diagnosis not present

## 2021-06-05 ENCOUNTER — Other Ambulatory Visit: Payer: Self-pay | Admitting: Interventional Cardiology

## 2021-06-05 ENCOUNTER — Other Ambulatory Visit: Payer: Self-pay

## 2021-06-05 ENCOUNTER — Ambulatory Visit
Admission: RE | Admit: 2021-06-05 | Discharge: 2021-06-05 | Disposition: A | Discharge status: Home / Self Care | Payer: Medicare Other | Source: Ambulatory Visit | Attending: Student in an Organized Health Care Education/Training Program | Admitting: Student in an Organized Health Care Education/Training Program

## 2021-06-05 ENCOUNTER — Ambulatory Visit (HOSPITAL_BASED_OUTPATIENT_CLINIC_OR_DEPARTMENT_OTHER): Payer: Medicare Other | Admitting: Student in an Organized Health Care Education/Training Program

## 2021-06-05 ENCOUNTER — Encounter: Payer: Self-pay | Admitting: Student in an Organized Health Care Education/Training Program

## 2021-06-05 ENCOUNTER — Inpatient Hospital Stay: Admission: RE | Admit: 2021-06-05 | Payer: Medicare Other | Source: Ambulatory Visit | Admitting: *Deleted

## 2021-06-05 VITALS — BP 146/51 | HR 84 | Temp 97.2°F | Ht 64.0 in | Wt 220.0 lb

## 2021-06-05 DIAGNOSIS — M5136 Other intervertebral disc degeneration, lumbar region: Secondary | ICD-10-CM | POA: Insufficient documentation

## 2021-06-05 DIAGNOSIS — M47816 Spondylosis without myelopathy or radiculopathy, lumbar region: Secondary | ICD-10-CM

## 2021-06-05 DIAGNOSIS — G894 Chronic pain syndrome: Secondary | ICD-10-CM | POA: Insufficient documentation

## 2021-06-05 DIAGNOSIS — M5416 Radiculopathy, lumbar region: Secondary | ICD-10-CM | POA: Insufficient documentation

## 2021-06-05 DIAGNOSIS — M533 Sacrococcygeal disorders, not elsewhere classified: Secondary | ICD-10-CM

## 2021-06-05 DIAGNOSIS — M51369 Other intervertebral disc degeneration, lumbar region without mention of lumbar back pain or lower extremity pain: Secondary | ICD-10-CM | POA: Insufficient documentation

## 2021-06-05 DIAGNOSIS — M419 Scoliosis, unspecified: Secondary | ICD-10-CM | POA: Diagnosis not present

## 2021-06-05 DIAGNOSIS — M5459 Other low back pain: Secondary | ICD-10-CM | POA: Insufficient documentation

## 2021-06-05 DIAGNOSIS — M4318 Spondylolisthesis, sacral and sacrococcygeal region: Secondary | ICD-10-CM | POA: Insufficient documentation

## 2021-06-05 NOTE — Progress Notes (Signed)
Safety precautions to be maintained throughout the outpatient stay will include: orient to surroundings, keep bed in low position, maintain call bell within reach at all times, provide assistance with transfer out of bed and ambulation.  

## 2021-06-05 NOTE — Progress Notes (Signed)
Patient: Sheryl Curtis  Service Category: E/M  Provider: Gillis Santa, MD  DOB: 11-Jul-1932  DOS: 06/05/2021  Referring Provider: Aurea Graff.Marlou Sa, MD  MRN: [id number]  Setting: Ambulatory outpatient  PCP: Alroy Dust, Carlean Jews.Marlou Sa, MD  Type: New Patient  Specialty: Interventional Pain Management    Location: Office  Delivery: Face-to-face     Primary Reason(s) for Visit: Encounter for initial evaluation of one or more chronic problems (new to examiner) potentially causing chronic pain, and posing a threat to normal musculoskeletal function. (Level of risk: High) CC: Hip Pain  HPI  Sheryl Curtis is a 87 y.o. year old, female patient, who comes for the first time to our practice referred by Alroy Dust, L.Marlou Sa, MD for our initial evaluation of her chronic pain. She has CANCER, COLORECTAL; Essential hypertension; Atrial fibrillation ([place]); Allergic rhinitis; G E R D; Fibromyalgia; INSOMNIA; DYSPNEA; CHEST PAIN, ATYPICAL; Essential hypertension, benign; Other and unspecified angina pectoris; Coronary atherosclerosis of native coronary artery; Pulmonary nodules/lesions, multiple; UTI (lower urinary tract infection); Fibromyalgia syndrome; Hyponatremia; Urinary tract infectious disease; Elevated troponin; Abdominal pain, generalized; Chronic kidney disease (CKD), stage III (moderate) (HCC); CAFL (chronic airflow limitation) ([place]); Generalized OA; Detrusor muscle hypertonia; H/O malignant neoplasm of colon; Asthma; Hypertensive urgency; Sinus bradycardia; Nocturnal hypoxia; Trochanteric bursitis, right hip; OSA (obstructive sleep apnea); Mild persistent asthma with (acute) exacerbation; Cough; CKD (chronic kidney disease); Morbid obesity ([place]); Physical deconditioning; Neck pain, chronic; Dysphagia; Mild persistent asthma without complication; Bilateral sensorineural hearing loss; Medication management; Healthcare maintenance; Chronic respiratory failure with hypoxia ([place]); Chronic diastolic heart failure ([place]); Lumbar facet  arthropathy; Lumbar degenerative disc disease; Lumbar facet joint pain; Lumbar radicular pain; and Chronic pain syndrome on their problem list. Today she comes in for evaluation of her Hip Pain  Pain Assessment: Location: Right Hip Radiating: pain radiaties down right hip to her ankle Onset: More than a month ago Duration: Chronic pain Quality: Constant, Throbbing, Aching, Sharp Severity: 9 /10 (subjective, self-reported pain score)  Effect on ADL: limits my daily activities Timing: Constant Modifying factors: lay down, sitting in a chair, i have to lean on my left side BP: (!) 146/51  HR: 84  Onset and Duration: Gradual Cause of pain: Unknown Severity: Getting worse, NAS-11 at its worse: 10/10, NAS-11 at its best: 6/10, NAS-11 now: 9/10, and NAS-11 on the average: 6/10 Timing: Morning and After activity or exercise Aggravating Factors: Lifiting, Prolonged standing, Surgery made it worse, Walking, Walking uphill, and Walking downhill Alleviating Factors: Lying down and Medications Associated Problems: Depression, Fatigue, Numbness, Pain that wakes patient up, and Pain that does not allow patient to sleep Quality of Pain: Aching, Burning, Constant, Horrible, Sharp, and Throbbing Previous Examinations or Tests: Cutaneous Pain Threshold Testing (CPT), MRI scan, and X-rays Previous Treatments: Epidural steroid injections and Narcotic medications   Sheryl Curtis is a pleasant 87 year old female with a history of fibromyalgia, cervical facet arthropathy, left shoulder osteoarthritis, right hip osteoarthritis along with lumbar degenerative disc disease, lumbar facet arthropathy, lumbar neuroforaminal stenosis.  She has been previously evaluated by physical medicine and rehab.  This was done by Dr. Edwin Dada.  She has undergone right-sided hip steroid injection along with trigger point injections.  She has had lumbar epidural steroid injection x2 with limited response.  Higher doses of Lyrica in the  past have caused her to have tremors.  She has tried and failed tramadol, Tylenol, Percocet, NSAIDs, Cymbalta, TCA, Celexa, Flexeril, tizanidine.  She does not take NSAIDs given her cardiac history.  Patient states that she has  had prior lumbar spine surgery over 10 years ago.  She is interested in learning more about diagnostic lumbar facet medial branch nerve blocks and lumbar radiofrequency ablation as she had a friend who had something similar which was helpful for her.  Of note, patient has done physical therapy in the past which was not helpful.  She has difficulty ambulating and walks with a walker.  She is fairly limited in her functional ability.  She also endorses bilateral buttock pain and overlying SI joint pain.   Historic Controlled Substance Pharmacotherapy Review   04/28/2021  04/11/2021   2  Pentazocine-Naloxone Tablet  90.00  30  L Mit  [id number]  [place] ([id number])  0/0  55.50 MME  Medicare  [place]    04/02/2021  04/02/2021   2  Alprazolam 0.5 Mg Tablet  90.00  30  L Mit  [id number]  [facility] ([id number])  0/0  3.00 LME  Medicare  [place]     Historical Monitoring: The patient  reports no history of drug use. List of all UDS Test(s): No results found for: MDMA, COCAINSCRNUR, [place], [place], CANNABQUANT, [place], [place] List of other Serum/Urine Drug Screening Test(s):  No results found for: AMPHSCRSER, BARBSCRSER, BENZOSCRSER, COCAINSCRSER, COCAINSCRNUR, PCPSCRSER, PCPQUANT, THCSCRSER, THCU, CANNABQUANT, OPIATESCRSER, OXYSCRSER, PROPOXSCRSER, ETH Historical Background Evaluation: [place] PMP: PDMP not reviewed this encounter. Online review of the past 77-monthperiod conducted.              [place] Department of public safety, offender search: (Editor, commissioningInformation) Non-contributory Risk Assessment Profile: Aberrant behavior: None observed or detected today Risk factors for fatal opioid overdose: Benzodiazepine use, concomitant use of Benzodiazepines, and sleep apnea Fatal overdose hazard ratio (HR): Calculation  deferred Non-fatal overdose hazard ratio (HR): Calculation deferred Risk of opioid abuse or dependence: 0.7-3.0% with doses ? 36 MME/day and 6.1-26% with doses ? 120 MME/day. Substance use disorder (SUD) risk level: See below Personal History of Substance Abuse (SUD-Substance use disorder):  Alcohol:    Illegal Drugs:    Rx Drugs:    ORT Risk Level calculation:    ORT Scoring interpretation table:  Score <3 = Low Risk for SUD  Score between 4-7 = Moderate Risk for SUD  Score >8 = High Risk for Opioid Abuse   PHQ-2 Depression Scale:  Total score:    PHQ-2 Scoring interpretation table: (Score and probability of major depressive disorder)  Score 0 = No depression  Score 1 = 15.4% Probability  Score 2 = 21.1% Probability  Score 3 = 38.4% Probability  Score 4 = 45.5% Probability  Score 5 = 56.4% Probability  Score 6 = 78.6% Probability   PHQ-9 Depression Scale:  Total score:    PHQ-9 Scoring interpretation table:  Score 0-4 = No depression  Score 5-9 = Mild depression  Score 10-14 = Moderate depression  Score 15-19 = Moderately severe depression  Score 20-27 = Severe depression (2.4 times higher risk of SUD and 2.89 times higher risk of overuse)   Medication management to continue with PCP.  We will focus on interventional pain management.  I advised against chronic benzodiazepine therapy in the context of chronic opioid therapy especially with comorbid cardiorespiratory issues as the patient has.  Meds   Current Outpatient Medications:    acetaminophen (TYLENOL) 650 MG CR tablet, Take 650 mg by mouth every 8 (eight) hours as needed for pain. , Disp: , Rfl:    albuterol (PROVENTIL) (2.5 MG/3ML) 0.083% nebulizer solution, USE 1 VIAL VIA NEBULIZER EVERY 6 HOURS  AS NEEDED, Disp: 1080 mL, Rfl: 1   Albuterol Sulfate (PROAIR RESPICLICK) 027 (90 Base) MCG/ACT AEPB, Inhale 1-2 puffs into the lungs every 6 (six) hours as needed (for shortness of breath and/or wheezing.)., Disp: 1 each,  Rfl: 6   ALPRAZolam (XANAX) 0.25 MG tablet, Take 0.25 mg by mouth 3 (three) times daily as needed for anxiety., Disp: , Rfl:    amitriptyline (ELAVIL) 10 MG tablet, Take 1 tablet (10 mg total) by mouth at bedtime as needed for sleep., Disp: 90 tablet, Rfl: 0   Ascorbic Acid (VITAMIN C) 1000 MG tablet, Take 1,000 mg by mouth daily., Disp: , Rfl:    Budeson-Glycopyrrol-Formoterol (BREZTRI AEROSPHERE) 160-9-4.8 MCG/ACT AERO, Inhale 2 puffs into the lungs in the morning and at bedtime., Disp: 5.9 g, Rfl: 0   Budeson-Glycopyrrol-Formoterol (BREZTRI AEROSPHERE) 160-9-4.8 MCG/ACT AERO, Inhale 2 puffs into the lungs in the morning and at bedtime., Disp: 2 g, Rfl: 0   Cholecalciferol (VITAMIN D) 50 MCG (2000 UT) tablet, Take 2,000 Units by mouth daily., Disp: , Rfl:    clopidogrel (PLAVIX) 75 MG tablet, TAKE 1 TABLET(75 MG) BY MOUTH EVERY OTHER DAY, Disp: 45 tablet, Rfl: 2   diphenhydrAMINE (BENADRYL ALLERGY) 25 mg capsule, Take 1 capsule (25 mg total) by mouth at bedtime as needed., Disp: 30 capsule, Rfl: 0   docusate sodium (COLACE) 100 MG capsule, Take 1 capsule (100 mg total) by mouth in the morning, at noon, and at bedtime., Disp: 10 capsule, Rfl: 0   DULoxetine (CYMBALTA) 60 MG capsule, Take 60 mg by mouth daily., Disp: , Rfl:    furosemide (LASIX) 40 MG tablet, Take 1 tablet (40 mg total) by mouth daily. Pt. Needs to make an appt. In order to receive anymore refills. 1st attempt., Disp: 30 tablet, Rfl: 0   isosorbide mononitrate (IMDUR) 60 MG 24 hr tablet, TAKE 1 TABLET BY MOUTH  DAILY, Disp: 90 tablet, Rfl: 0   Nebulizers (COMPRESSOR NEBULIZER) MISC, 1 Device by Does not apply route once., Disp: 1 each, Rfl: 0   nitroGLYCERIN (NITROSTAT) 0.4 MG SL tablet, Place 1 tablet (0.4 mg total) under the tongue every 5 (five) minutes as needed for chest pain., Disp: 25 tablet, Rfl: 3   Omega-3 Fatty Acids (FISH OIL) 1200 MG CAPS, Take 1,200 mg by mouth daily., Disp: , Rfl:    polyethylene glycol (MIRALAX /  GLYCOLAX) 17 g packet, Take 17 g by mouth daily as needed for mild constipation. , Disp: , Rfl:    potassium chloride SA (KLOR-CON) 20 MEQ tablet, TAKE 1 TABLET BY MOUTH  DAILY, Disp: 90 tablet, Rfl: 3   pregabalin (LYRICA) 25 MG capsule, Take 1 capsule (25 mg total) by mouth 2 (two) times daily., Disp: 30 capsule, Rfl: 0   Respiratory Therapy Supplies (FLUTTER) DEVI, Use flutter device 3 times a day, Disp: 1 each, Rfl: 0   rosuvastatin (CRESTOR) 20 MG tablet, TAKE 1 TABLET BY MOUTH  DAILY, Disp: 90 tablet, Rfl: 3   valsartan-hydrochlorothiazide (DIOVAN-HCT) 160-12.5 MG tablet, Take 1 tablet by mouth daily., Disp: , Rfl:    carbamide peroxide (DEBROX) 6.5 % OTIC solution, Place 5 drops into both ears 2 (two) times daily. (Patient not taking: Reported on 06/05/2021), Disp: 15 mL, Rfl: 0   gabapentin (NEURONTIN) 300 MG capsule, Take 1 capsule (300 mg total) by mouth at bedtime. (Patient not taking: Reported on 06/05/2021), Disp: 60 capsule, Rfl: 1   ipratropium (ATROVENT) 0.03 % nasal spray, Place 2 sprays into both nostrils every  12 (twelve) hours. (Patient not taking: Reported on 06/05/2021), Disp: 30 mL, Rfl: 1   lidocaine (LIDODERM) 5 %, Place 1 patch onto the skin daily. Remove & Discard patch within 12 hours or as directed by MD (Patient not taking: Reported on 06/05/2021), Disp: 30 patch, Rfl: 0   morphine (MS CONTIN) 15 MG 12 hr tablet, Take 1 tablet (15 mg total) by mouth every 12 (twelve) hours. (Patient not taking: Reported on 06/05/2021), Disp: 60 tablet, Rfl: 0   oxyCODONE-acetaminophen (PERCOCET) 10-325 MG tablet, Take 1 tablet by mouth 2 (two) times daily as needed for pain. (Patient not taking: Reported on 06/05/2021), Disp: 60 tablet, Rfl: 0   senna (SENOKOT) 8.6 MG TABS tablet, Take 2 tablets (17.2 mg total) by mouth at bedtime. (Patient not taking: Reported on 06/05/2021), Disp: 120 tablet, Rfl: 0  Imaging Review  Cervical Imaging: Cervical MR wo contrast: Results for orders placed during the  hospital encounter of 05/16/20  MR Cervical Spine w/o contrast  Narrative CLINICAL DATA:  Spinal stenosis.  Assess for left nerve compression.  EXAM: MRI CERVICAL SPINE WITHOUT CONTRAST  TECHNIQUE: Multiplanar, multisequence MR imaging of the cervical spine was performed. No intravenous contrast was administered.  COMPARISON:  04/19/2020 cervical spine radiographs.  FINDINGS: Please note that motion artifact limits evaluation.  Alignment: Mild reversal of cervical lordosis. Minimal grade 1 C2-3 anterolisthesis. Stepwise grade 1 C5-6 and C6-7 retrolisthesis.  Vertebrae: Normal bone marrow signal intensity. No focal osseous lesion.  Cord: Normal signal and morphology.  Posterior Fossa, vertebral arteries: Negative.  Disc levels: Multilevel desiccation. Mild C3-4 and moderate C5-7 disc space loss.  C2-3: Small central protrusion with uncovertebral and bilateral facet hypertrophy. Mild left neural foraminal narrowing.  C3-4: Disc osteophyte complex with superimposed central protrusion abutting the ventral cord, uncovertebral and bilateral facet hypertrophy. Mild spinal canal and bilateral neural foraminal narrowing.  C4-5: Disc osteophyte complex with superimposed left paracentral protrusion abutting the ventral cord, left predominant uncovertebral and facet hypertrophy. Mild spinal canal and mild left greater than right neural foraminal narrowing.  C5-6: Disc osteophyte complex abutting the ventral cord with superimposed left subarticular/foraminal protrusion, uncovertebral and bilateral facet hypertrophy. Mild to moderate spinal canal and moderate bilateral neural foraminal narrowing.  C6-7: Disc osteophyte complex abutting the ventral cord with small superimposed left foraminal protrusion, uncovertebral and bilateral facet hypertrophy. Moderate spinal canal and moderate to severe bilateral neural foraminal narrowing.  C7-T1: Tiny central protrusion. No  significant spinal canal or neural foraminal narrowing.  Paraspinal tissues: Within normal limits.  IMPRESSION: Moderate to severe bilateral C5-6 and C6-7 neural foraminal narrowing.  Mild to moderate C5-6 and moderate C6-7 spinal canal narrowing.  Mild left C2-3 and bilateral C3-5 neural foraminal narrowing.   Electronically Signed By: Primitivo Gauze M.D. On: 05/18/2020 08:30  Lumbosacral Imaging: Lumbar MR wo contrast: Results for orders placed during the hospital encounter of 08/21/16  MR LUMBAR SPINE WO CONTRAST  Narrative CLINICAL DATA:  Chronic low back pain, leg pain with acute low back pain radiating to bilateral lower extremities for 6 months after fall at home. Remote history of lumbar spine surgery without improvement.  EXAM: MRI LUMBAR SPINE WITHOUT CONTRAST  TECHNIQUE: Multiplanar, multisequence MR imaging of the lumbar spine was performed. No intravenous contrast was administered.  COMPARISON:  MRI lumbar spine July 01, 2015  FINDINGS: SEGMENTATION: For the purposes of this report, the last well-formed intervertebral disc will be described as L5-S1.  ALIGNMENT: Maintenance of the lumbar lordosis. Minimal grade 1 L5-S1  retrolisthesis without spondylolysis.  VERTEBRAE:Vertebral bodies are intact. Similar severe L3-4, L4-5 and L5-S1 disc height loss, associated with levoscoliosis. Severe T11-12 and T12-L1 disc height loss associated with dextroscoliosis. Decreased T2 signal within all disc compatible with desiccation with multilevel vacuum disc. Moderate lower lumbar subacute on chronic discogenic endplate changes. No bone marrow signal abnormality to suggest acute osseous process.  CONUS MEDULLARIS: Conus medullaris terminates at T12-L1 and demonstrates normal morphology and signal characteristics. Similar 4 mm low signal nodule RIGHT thecal sac at L4 best seen on sagittal T2 10/17. Cauda equina is normal.  PARASPINAL AND SOFT TISSUES:  Included prevertebral and paraspinal soft tissues are nonacute. Moderate to severe symmetric paraspinal muscle atrophy. Stable renal cysts measuring up to 15 mm on the RIGHT.  DISC LEVELS:  L1-2: Stable small broad-based disc bulge asymmetric to LEFT. Mild facet arthropathy and ligamentum flavum redundancy without canal stenosis. Minimal LEFT neural foraminal narrowing.  L2-3: Stable small broad-based disc bulge. Mild facet arthropathy and ligamentum flavum redundancy without canal stenosis. Mild to moderate LEFT neural foraminal narrowing.  L3-4: Small broad-based disc bulge, moderate RIGHT subarticular disc protrusion. Mild facet arthropathy and ligamentum flavum redundancy. No canal stenosis though partial chronic effacement RIGHT lateral recess could affect the traversing RIGHT L4 nerve. Moderate to severe RIGHT, mild LEFT neural foraminal narrowing.  L4-5: Stable moderate broad-based disc bulge asymmetric to the RIGHT. Moderate RIGHT and mild LEFT facet arthropathy with ligamentum flavum redundancy. No canal stenosis. Severe RIGHT, moderate LEFT neural foraminal narrowing.  L5-S1: Moderate broad-based disc bulge. Mild to moderate facet arthropathy and ligamentum flavum redundancy without canal stenosis. Moderate to severe RIGHT, severe LEFT neural foraminal narrowing.  IMPRESSION: Stable degenerative change of the lumbar spine without fracture or malalignment.  No canal stenosis. Neural foraminal narrowing at all lumbar levels: Severe on the RIGHT at L4-5 and severe on the LEFT at L5-S1.  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   Electronically Signed By: Elon Alas M.D. On: 08/22/2016 03:14  Narrative FINDINGS CLINICAL DATA:  LOW BACK AND LEFT LOWER EXTREMITY PAIN.  BILATERAL HIP PAIN.  PRIOR LUMBAR SPINE SURGERY (LAMINECTOMY WITH MICRODISSECTION) PLAIN FILMS OF THE RIGHT HIP: TWO VIEWS, AP AND FROGLEG LATERAL VIEWS, WERE  OBTAINED. THERE IS NO EVIDENCE OF FRACTURE OR DISLOCATION. NO OTHER SIGNIFICANT BONE OR SOFT TISSUE ABNORMALITIES ARE IDENTIFIED. THE JOINT SPACES ARE WITHIN NORMAL  LIMITS. IMPRESSION NORMAL STUDY. PLAIN FILMS OF THE LEFT HIP: TWO VIEWS, AP AND FROGLEG LATERAL VIEWS, WERE OBTAINED. THERE IS NO EVIDENCE OF FRACTURE OR DISLOCATION. NO OTHER SIGNIFICANT BONE OR SOFT TISSUE ABNORMALITIES ARE IDENTIFIED. THE JOINT SPACES ARE WITHIN NORMAL  LIMITS. IMPRESSION NORMAL STUDY. COMPLETE PLAIN FILM SERIES OF THE LUMBAR SPINE, FIVE VIEWS: AP, LATERAL AND OBLIQUE VIEWS WERE OBTAINED.  DIFFUSE OSTEOPENIA . LOWER LUMBAR SCOLIOSIS, CONVEX TO THE LEFT.  THERE IS RIGHT LATERAL LISTHESIS OF L3 ON L4 BY A FEW MILLIMETERS AND NARROWING OF THE L3-4 AND L4-5 DISC SPACES WITH END PLATE SCLEROSIS AND OSTEOPHYTIC FORMATION, i.e. THE DEGENERATIVE CHANGES ARE MOST MARKED ALONG THE CONCAVE ASPECT OF THE SCOLIOTIC CURVE.  THERE IS SLIGHT LEFT LATERAL LISTHESIS OF L4 ON L5.  NOT ONLY IS THERE IS DISC SPACE NARROWING ON THE RIGHT AT L3-4 AND L4-5, THERE IS ALSO DISC SPACE NARROWING AT L5-S1.  SMALL LAMINOTOMY DEFECTS ARE NOTED ON THE LEFT AT L4-5.  FIVE NON-RIB BEARING LUMBAR VERTEBRAE ARE NOTED.  THERE IS A CALCIFIC DENSITY IN THE WALL OF THE ABDOMINAL AORTA  AND COMMON ILIAC ARTERIES.  THERE ARE MILD DEGENERATIVE CHANGES INVOLVING THE SI JOINTS. IMPRESSION ADVANCED DEGENERATIVE DISC DISEASE CHANGES MAINLY ALONG THE CONCAVE ASPECT OF SCOLIOTIC CURVE ON THE RIGHT AT L3-4 AND L4-5.  DEGENERATIVE DISC DISEASE CHANGES ARE ALSO APPRECIATED AT L5-S1. MRI OF THE LUMBAR SPINE WITH AND WITHOUT IV CONTRAST: MULTIPLANAR, MULTISEQUENCE IMAGES WERE ACQUIRED PRIOR TO AND FOLLOWING IV INJECTION OF 15 CC. OF OMNISCAN. THE FINDINGS ARE AS FOLLOWS: T11-12:  DISC SPACE NARROWING AND MILD CIRCUMFERENTIAL ANNULAR BULGING. T12-L1:  MILD DISC HERNIATION, POSTEROLATERALLY ON THE LEFT. L1-2:  NO HNP OR STENOSIS. L2-3:  SMALL ECCENTRIC DISC  BULGE, POSTEROLATERALLY ON THE LEFT. L3-4:  ADVANCED DEGENERATIVE DISC DISEASE CHANGES ARE PRESENT, PRIMARILY ON THE RIGHT.  THERE IS AN HNP IN THE MIDLINE AND PARACENTRALLY AND POSTEROLATERALLY ON THE RIGHT.  EXTENSIVE MODIC TYPE I CHANGES ARE NOTED INVOLVING THE MARROW OF L3 AND L4 VERTEBRAL BODIES.  THERE IS CONTRAST ENHANCEMENT OF THE MARROW AS WELL.  FINDINGS ARE COMPATIBLE WITH ACTIVE ONGOING DEGENERATIVE [place] DISEASE. L4-5:   BROAD BASE POSTERIOR DISC HERNIATION MAINLY POSTEROLATERALLY ON THE LEFT.  HERNIATED DISC MATERIAL ALSO CONTRAST ENHANCES COMPATIBLE WITH INGROWTH OF FIBROVASCULAR ELEMENTS.  THERE MAY BE MASS EFFECT ON THE LEFT AT L5 NERVE ROOT.  THERE IS STENOSIS OF THE MEDIAL ASPECT OF LEFT L4-5 FORAMEN. L5-S1:  MARKED DISC SPACE NARROWING.  MODERATE FORAMINAL STENOSIS ON THE RIGHT.  THERE IS MARKED FORAMINAL STENOSIS ON THE LEFT WITH MARKED ENCROACHMENT ON THE LEFT L5 NERVE ROOT. IMPRESSION THE MAIN FINDINGS ARE SEVERE DEGENERATIVE DISC DISEASE CHANGES AT L3-4 WITH REACTIVE MARROW EDEMA, HNP CENTRAL AND TO THE RIGHT AT L3-4.  BROAD BASED POSTERIOR HNP L4-5, MAINLY PARACENTRALLY AND POSTEROLATERALLY ON THE LEFT.  MEDIAL FORAMINAL STENOSIS BILATERALLY AT L4-5.  MARKED FORAMINAL STENOSIS ON THE LEFT AT L5-S1.  SEE COMMENTS ABOVE.  Narrative FINDINGS CLINICAL HISTORY:  LOW BACK AND BILATERAL LEG PAIN, WORSE ON THE LEFT. LUMBAR MYELOGRAM: LUMBAR PUNCTURE WAS PERFORMED AT THE L3-4 LEVEL UTILIZING A 22 GAUGE SPINAL NEEDLE.  12 CC OF OMNIPAQUE 180 WERE INSTILLED UNDER FLUOROSCOPIC OBSERVATION. THERE IS A MINIMAL ANTERIOR EXTRADURAL DEFECT AT L2-3 CONSISTENT WITH A MINIMAL DISK BULGE. NO STENOSIS.  ROOT SLEEVES FILL NORMALLY. AT L3-4, THERE IS A MILD ANTERIOR EXTRADURAL DEFECT CONSISTENT WITH A DISK BULGE.  THERE IS MILD NARROWING OF THE RIGHT LATERAL RECESS.  THE L3 ROOT SLEEVES FILL NORMALLY.  IT IS POSSIBLE THAT THE L4 NERVE ROOT COULD BE AFFECTED IN THE RIGHT LATERAL  RECESS. AT L4-5, THERE IS A MODERATE SIZE ANTERIOR EXTRADURAL DEFECT CONSISTENT WITH A DISK BULGE.  THERE IS SOME RIGHT LATERAL RECESS NARROWING APPARENTLY DUE TO FACET OSTEOPHYTES.  THE L4 ROOT SLEEVES FILL WITHIN NORMAL LIMITS.  THE PATIENT HAS APPARENTLY HAD LEFT HEMILAMINECTOMY AT THIS LEVEL AS THERE IS SOME PATULOUSNESS OF THE THECAL SAC IN THIS REGION.  THE RIGHT LATERAL RECESS IS NARROWED SUCH THAT IT IS POSSIBLE THAT THE RIGHT L5 NERVE ROOT COULD BE AFFECTED.  NO LEFT SIDED ENCROACHMENT IS EVIDENT. L5-S1:  THE DISK IS DEGENERATED AS EVIDENCED BY VACUUM PHENOMENON.  THERE IS NO ANTERIOR EXTRADURAL DEFECT.  NO CENTRAL CANAL STENOSIS OR LATERAL RECESS STENOSIS.  THE S1 ROOT SLEEVES FILL NORMALLY. IMPRESSION POSTOPERATIVE CHANGES ON THE LEFT AT L4-5.  THERE IS NARROWING OF THE RIGHT LATERAL RECESS AT THIS LEVEL WHICH COULD POSSIBLY AFFECT THE RIGHT L5 NERVE ROOT.  THIS IS OF DOUBTFUL SIGNIFICANCE IN THIS PATIENT WITH LEFT SIDED PREDOMINANT SYMPTOMS. RIGHT LATERAL RECESS STENOSIS ALSO  AT THE L3-4 LEVEL WHICH COULD AFFECT THE RIGHT L4 NERVE ROOT. DEGENERATIVE DISK DISEASE AT L5-S1 WITHOUT EVIDENCE OF STENOSIS OR NEURAL COMPRESSION. CAUSE OF THE PATIENT'S LEFT SIDED SYMPTOMS IS NOT DEMONSTRATED. POST MYELOGRAM CT SCAN: 5 MM SCANS AT 3 MM INTERVALS WERE MADE AFTER CONTRAST WAS INSTILLED INTO THE THECAL SAC AT MYELOGRAPHY. L1-2:  NORMAL INTERSPACE. L2-3:  MINIMAL DISK BULGE BUT NO HNP, STENOSIS, OR FORAMINAL COMPROMISE.  MINIMAL FACET DEGENERATION ON THE LEFT. L3-4:  THE DISK IS DEGENERATED AND BULGES MILDLY IN A DIFFUSE FASHION.  NO SIGNIFICANT FACET DISEASE.  THERE IS MILD NARROWING AT THE RIGHT LATERAL RECESS.  I THINK THE L3 NERVE ROOT IS CONNECTED FREELY BUT IT IS POSSIBLE THE RIGHT L4 NERVE ROOT COULD BE AFFECTED IN THE LATERAL RECESS. L4-5:  THE PATIENT HAS HAD LEFT HEMILAMINECTOMY.  THE DISK BULGES MILDLY IN A DIFFUSE FASHION AND THERE ARE SMALL OSTEOPHYTES.  THE FACETS ARE  DEGENERATED BILATERALLY, MORE ON THE RIGHT THAN ON THE LEFT.  THERE IS MILD NARROWING OF THE RIGHT LATERAL RECESS WHICH COULD AFFECT THE RIGHT L5 NERVE ROOT.  THERE IS ALSO SOME RIGHT FORAMINAL TO EXTRAFORAMINAL DISK WHICH COULD AFFECT THE RIGHT L4 NERVE ROOT IN AN EXTRAFORAMINAL LOCATION.  THE FORAMEN ON THE LEFT IS SOMEWHAT NARROW AND IT IS POSSIBLE THAT THE LEFT L4 ROOT COULD BE AFFECTED, ALTHOUGH GROSS COMPRESSION IS NOT ESTABLISHED. L5-S1:  DEGENERATIVE DISK DISEASE WITHOUT EVIDENCE OF HERNIATION OR STENOSIS.  NEURAL FORAMINA APPEAR SUFFICIENTLY PATENT.  S1 NERVE ROOTS SHOW NO COMPRESSION. IMPRESSION IN THIS PATIENT WITH PREDOMINANTLY LEFT SIDED RADICULAR PAIN, A CAUSATIVE LESION IS NOT CLEARLY DEMONSTRATED.  THERE ARE POSTOPERATIVE CHANGES ON THE LEFT AT L4-5 BUT I SEE NO LEFT SIDED STENOSIS OR COMPRESSIVE LESION. THE LEFT SIDED FORAMEN IS SOMEWHAT NARROW AND I SUPPOSE IT IS POSSIBLE THAT L4 ROOT IRRITATION COULD DERIVE FROM THIS LOCATION. MILD RIGHT LATERAL RECESS NARROWING AT L3-4 WHICH COULD AFFECT THE RIGHT L4 NERVE ROOT. MILD RIGHT LATERAL RECESS NARROWING AT L4-5 WHICH COULD AFFECT THE RIGHT L5 NERVE ROOT.  ALSO AT L4- 5, THERE IS RIGHT FORAMINAL TO EXTRAFORAMINAL DISK MATERIAL WHICH COULD AFFECT THE RIGHT L4 NERVE ROOT. DISK DEGENERATION AT L5-S1 WITHOUT STENOSIS. Narrative CLINICAL DATA:  Fall.  Low back pain.  Pain radiates into legs.  EXAM: LUMBAR SPINE - 2-3 VIEW  COMPARISON:  CT 09/15/2016.  FINDINGS: Thoracolumbar spine scoliosis and diffuse degenerative change. Degenerative changes both hips. No acute bony abnormality identified. No evidence of fracture. Aortoiliac atherosclerotic vascular calcification.  IMPRESSION: Thoracic spine scoliosis and degenerative change. Degenerative changes both hips. No acute bony abnormalities identified.   Electronically Signed By: Andrews On: 06/26/2018 09:27    Narrative FINDINGS CLINICAL DATA:  LOW BACK AND  LEFT LOWER EXTREMITY PAIN.  BILATERAL HIP PAIN.  PRIOR LUMBAR SPINE SURGERY (LAMINECTOMY WITH MICRODISSECTION) PLAIN FILMS OF THE RIGHT HIP: TWO VIEWS, AP AND FROGLEG LATERAL VIEWS, WERE OBTAINED. THERE IS NO EVIDENCE OF FRACTURE OR DISLOCATION. NO OTHER SIGNIFICANT BONE OR SOFT TISSUE ABNORMALITIES ARE IDENTIFIED. THE JOINT SPACES ARE WITHIN NORMAL  LIMITS. IMPRESSION NORMAL STUDY. PLAIN FILMS OF THE LEFT HIP: TWO VIEWS, AP AND FROGLEG LATERAL VIEWS, WERE OBTAINED. THERE IS NO EVIDENCE OF FRACTURE OR DISLOCATION. NO OTHER SIGNIFICANT BONE OR SOFT TISSUE ABNORMALITIES ARE IDENTIFIED. THE JOINT SPACES ARE WITHIN NORMAL  LIMITS. IMPRESSION NORMAL STUDY. COMPLETE PLAIN FILM SERIES OF THE LUMBAR SPINE, FIVE VIEWS: AP, LATERAL AND OBLIQUE VIEWS WERE OBTAINED.  DIFFUSE OSTEOPENIA . LOWER LUMBAR SCOLIOSIS, CONVEX TO  THE LEFT.  THERE IS RIGHT LATERAL LISTHESIS OF L3 ON L4 BY A FEW MILLIMETERS AND NARROWING OF THE L3-4 AND L4-5 DISC SPACES WITH END PLATE SCLEROSIS AND OSTEOPHYTIC FORMATION, i.e. THE DEGENERATIVE CHANGES ARE MOST MARKED ALONG THE CONCAVE ASPECT OF THE SCOLIOTIC CURVE.  THERE IS SLIGHT LEFT LATERAL LISTHESIS OF L4 ON L5.  NOT ONLY IS THERE IS DISC SPACE NARROWING ON THE RIGHT AT L3-4 AND L4-5, THERE IS ALSO DISC SPACE NARROWING AT L5-S1.  SMALL LAMINOTOMY DEFECTS ARE NOTED ON THE LEFT AT L4-5.  FIVE NON-RIB BEARING LUMBAR VERTEBRAE ARE NOTED.  THERE IS A CALCIFIC DENSITY IN THE WALL OF THE ABDOMINAL AORTA AND COMMON ILIAC ARTERIES.  THERE ARE MILD DEGENERATIVE CHANGES INVOLVING THE SI JOINTS. IMPRESSION ADVANCED DEGENERATIVE DISC DISEASE CHANGES MAINLY ALONG THE CONCAVE ASPECT OF SCOLIOTIC CURVE ON THE RIGHT AT L3-4 AND L4-5.  DEGENERATIVE DISC DISEASE CHANGES ARE ALSO APPRECIATED AT L5-S1. MRI OF THE LUMBAR SPINE WITH AND WITHOUT IV CONTRAST: MULTIPLANAR, MULTISEQUENCE IMAGES WERE ACQUIRED PRIOR TO AND FOLLOWING IV INJECTION OF 15 CC. OF OMNISCAN. THE FINDINGS ARE AS  FOLLOWS: T11-12:  DISC SPACE NARROWING AND MILD CIRCUMFERENTIAL ANNULAR BULGING. T12-L1:  MILD DISC HERNIATION, POSTEROLATERALLY ON THE LEFT. L1-2:  NO HNP OR STENOSIS. L2-3:  SMALL ECCENTRIC DISC BULGE, POSTEROLATERALLY ON THE LEFT. L3-4:  ADVANCED DEGENERATIVE DISC DISEASE CHANGES ARE PRESENT, PRIMARILY ON THE RIGHT.  THERE IS AN HNP IN THE MIDLINE AND PARACENTRALLY AND POSTEROLATERALLY ON THE RIGHT.  EXTENSIVE MODIC TYPE I CHANGES ARE NOTED INVOLVING THE MARROW OF L3 AND L4 VERTEBRAL BODIES.  THERE IS CONTRAST ENHANCEMENT OF THE MARROW AS WELL.  FINDINGS ARE COMPATIBLE WITH ACTIVE ONGOING DEGENERATIVE [place] DISEASE. L4-5:   BROAD BASE POSTERIOR DISC HERNIATION MAINLY POSTEROLATERALLY ON THE LEFT.  HERNIATED DISC MATERIAL ALSO CONTRAST ENHANCES COMPATIBLE WITH INGROWTH OF FIBROVASCULAR ELEMENTS.  THERE MAY BE MASS EFFECT ON THE LEFT AT L5 NERVE ROOT.  THERE IS STENOSIS OF THE MEDIAL ASPECT OF LEFT L4-5 FORAMEN. L5-S1:  MARKED DISC SPACE NARROWING.  MODERATE FORAMINAL STENOSIS ON THE RIGHT.  THERE IS MARKED FORAMINAL STENOSIS ON THE LEFT WITH MARKED ENCROACHMENT ON THE LEFT L5 NERVE ROOT. IMPRESSION THE MAIN FINDINGS ARE SEVERE DEGENERATIVE DISC DISEASE CHANGES AT L3-4 WITH REACTIVE MARROW EDEMA, HNP CENTRAL AND TO THE RIGHT AT L3-4.  BROAD BASED POSTERIOR HNP L4-5, MAINLY PARACENTRALLY AND POSTEROLATERALLY ON THE LEFT.  MEDIAL FORAMINAL STENOSIS BILATERALLY AT L4-5.  MARKED FORAMINAL STENOSIS ON THE LEFT AT L5-S1.  SEE COMMENTS ABOVE.   Narrative FINDINGS CLINICAL HISTORY:  LOW BACK AND BILATERAL LEG PAIN, WORSE ON THE LEFT. LUMBAR MYELOGRAM: LUMBAR PUNCTURE WAS PERFORMED AT THE L3-4 LEVEL UTILIZING A 22 GAUGE SPINAL NEEDLE.  12 CC OF OMNIPAQUE 180 WERE INSTILLED UNDER FLUOROSCOPIC OBSERVATION. THERE IS A MINIMAL ANTERIOR EXTRADURAL DEFECT AT L2-3 CONSISTENT WITH A MINIMAL DISK BULGE. NO STENOSIS.  ROOT SLEEVES FILL NORMALLY. AT L3-4, THERE IS A MILD ANTERIOR EXTRADURAL DEFECT  CONSISTENT WITH A DISK BULGE.  THERE IS MILD NARROWING OF THE RIGHT LATERAL RECESS.  THE L3 ROOT SLEEVES FILL NORMALLY.  IT IS POSSIBLE THAT THE L4 NERVE ROOT COULD BE AFFECTED IN THE RIGHT LATERAL RECESS. AT L4-5, THERE IS A MODERATE SIZE ANTERIOR EXTRADURAL DEFECT CONSISTENT WITH A DISK BULGE.  THERE IS SOME RIGHT LATERAL RECESS NARROWING APPARENTLY DUE TO FACET OSTEOPHYTES.  THE L4 ROOT SLEEVES FILL WITHIN NORMAL LIMITS.  THE PATIENT HAS APPARENTLY HAD LEFT HEMILAMINECTOMY AT THIS LEVEL AS THERE IS SOME PATULOUSNESS OF THE  THECAL SAC IN THIS REGION.  THE RIGHT LATERAL RECESS IS NARROWED SUCH THAT IT IS POSSIBLE THAT THE RIGHT L5 NERVE ROOT COULD BE AFFECTED.  NO LEFT SIDED ENCROACHMENT IS EVIDENT. L5-S1:  THE DISK IS DEGENERATED AS EVIDENCED BY VACUUM PHENOMENON.  THERE IS NO ANTERIOR EXTRADURAL DEFECT.  NO CENTRAL CANAL STENOSIS OR LATERAL RECESS STENOSIS.  THE S1 ROOT SLEEVES FILL NORMALLY. IMPRESSION POSTOPERATIVE CHANGES ON THE LEFT AT L4-5.  THERE IS NARROWING OF THE RIGHT LATERAL RECESS AT THIS LEVEL WHICH COULD POSSIBLY AFFECT THE RIGHT L5 NERVE ROOT.  THIS IS OF DOUBTFUL SIGNIFICANCE IN THIS PATIENT WITH LEFT SIDED PREDOMINANT SYMPTOMS. RIGHT LATERAL RECESS STENOSIS ALSO AT THE L3-4 LEVEL WHICH COULD AFFECT THE RIGHT L4 NERVE ROOT. DEGENERATIVE DISK DISEASE AT L5-S1 WITHOUT EVIDENCE OF STENOSIS OR NEURAL COMPRESSION. CAUSE OF THE PATIENT'S LEFT SIDED SYMPTOMS IS NOT DEMONSTRATED. POST MYELOGRAM CT SCAN: 5 MM SCANS AT 3 MM INTERVALS WERE MADE AFTER CONTRAST WAS INSTILLED INTO THE THECAL SAC AT MYELOGRAPHY. L1-2:  NORMAL INTERSPACE. L2-3:  MINIMAL DISK BULGE BUT NO HNP, STENOSIS, OR FORAMINAL COMPROMISE.  MINIMAL FACET DEGENERATION ON THE LEFT. L3-4:  THE DISK IS DEGENERATED AND BULGES MILDLY IN A DIFFUSE FASHION.  NO SIGNIFICANT FACET DISEASE.  THERE IS MILD NARROWING AT THE RIGHT LATERAL RECESS.  I THINK THE L3 NERVE ROOT IS CONNECTED FREELY BUT IT IS POSSIBLE THE RIGHT L4 NERVE  ROOT COULD BE AFFECTED IN THE LATERAL RECESS. L4-5:  THE PATIENT HAS HAD LEFT HEMILAMINECTOMY.  THE DISK BULGES MILDLY IN A DIFFUSE FASHION AND THERE ARE SMALL OSTEOPHYTES.  THE FACETS ARE DEGENERATED BILATERALLY, MORE ON THE RIGHT THAN ON THE LEFT.  THERE IS MILD NARROWING OF THE RIGHT LATERAL RECESS WHICH COULD AFFECT THE RIGHT L5 NERVE ROOT.  THERE IS ALSO SOME RIGHT FORAMINAL TO EXTRAFORAMINAL DISK WHICH COULD AFFECT THE RIGHT L4 NERVE ROOT IN AN EXTRAFORAMINAL LOCATION.  THE FORAMEN ON THE LEFT IS SOMEWHAT NARROW AND IT IS POSSIBLE THAT THE LEFT L4 ROOT COULD BE AFFECTED, ALTHOUGH GROSS COMPRESSION IS NOT ESTABLISHED. L5-S1:  DEGENERATIVE DISK DISEASE WITHOUT EVIDENCE OF HERNIATION OR STENOSIS.  NEURAL FORAMINA APPEAR SUFFICIENTLY PATENT.  S1 NERVE ROOTS SHOW NO COMPRESSION. IMPRESSION IN THIS PATIENT WITH PREDOMINANTLY LEFT SIDED RADICULAR PAIN, A CAUSATIVE LESION IS NOT CLEARLY DEMONSTRATED.  THERE ARE POSTOPERATIVE CHANGES ON THE LEFT AT L4-5 BUT I SEE NO LEFT SIDED STENOSIS OR COMPRESSIVE LESION. THE LEFT SIDED FORAMEN IS SOMEWHAT NARROW AND I SUPPOSE IT IS POSSIBLE THAT L4 ROOT IRRITATION COULD DERIVE FROM THIS LOCATION. MILD RIGHT LATERAL RECESS NARROWING AT L3-4 WHICH COULD AFFECT THE RIGHT L4 NERVE ROOT. MILD RIGHT LATERAL RECESS NARROWING AT L4-5 WHICH COULD AFFECT THE RIGHT L5 NERVE ROOT.  ALSO AT L4- 5, THERE IS RIGHT FORAMINAL TO EXTRAFORAMINAL DISK MATERIAL WHICH COULD AFFECT THE RIGHT L4 NERVE ROOT. DISK DEGENERATION AT L5-S1 WITHOUT STENOSIS.  Narrative FINDINGS CLINICAL DATA:  BACK PAIN; LEFT LEG PAIN THE PATIENT GOT TEMPORARY RELIEF FROM THE L3-4 NONSELECTIVE EPIDURALS.  SHE APPEARS TO HAVE SOME COMPONENTS OF AN L5 RADICULOPATHY WITH PAIN EXTENDING TO HER FOOT.   SHE HAS HAD PREVIOUS SURGERY AT L4-5 ON THE LEFT AND MAY HAVE SOME MILD RESIDUAL MASS EFFECT BASED ON MYELO/CT FROM 02/24/01. I ELECTED TO PERFORM A LEFT L5 NERVE ROOT BLOCK. LUMBAR EPIDURAL: FOLLOWING  INFORMED CONSENT, STERILE PREPARATION OF THE BACK, AND ADEQUATE LOCAL ANESTHESIA, A 22 GAUGE SPINAL NEEDLE WAS PLACED IN THE FORAMEN AT L5-S1 ON THE LEFT.  CONTRAST INJECTION USING  3 CC OF OMNIPAQUE 180 OUTLINED THE PERIPHERAL AND CENTRAL PORTIONS OF THE LEFT L5 ROOT. I INJECTED 120 MG OF DEPO-MEDROL ALONG WITH 3 CC OF 1 PERCENT LIDOCAINE.  POST PROCEDURE THE PATIENT WAS COMFORTABLE. IMPRESSION TECHNICALLY SUCCESSFUL LEFT L5 SELECTIVE NERVE ROOT BLOCK AND TRANSFORAMINAL EPIDURAL.  [facility]: Ankle-R DG Complete: Results for orders placed during the hospital encounter of 07/12/14  DG Ankle Complete Right  Narrative CLINICAL DATA:  Fall 2 days ago  EXAM: RIGHT ANKLE - COMPLETE 3+ VIEW  COMPARISON:  None.  FINDINGS: K-wire within the medial malleolus. Plate and screws in the distal fibula. No breakage or loosening of the hardware. No acute fracture or dislocation. Soft tissue swelling about the ankle joint is noted. Spurring at the inferior calcaneus.  IMPRESSION: No acute bony pathology.  Postoperative changes.   Electronically Signed By: Maryclare Bean M.D. On: 07/12/2014 14:15    Complexity Note: Imaging results reviewed. Results shared with Sheryl Curtis, using Layman's terms.                         ROS  Cardiovascular: Abnormal heart rhythm and High blood pressure Pulmonary or Respiratory: Shortness of breath, Snoring , and Coughing up mucus (Bronchitis) Neurological: No reported neurological signs or symptoms such as seizures, abnormal skin sensations, urinary and/or fecal incontinence, being born with an abnormal open spine and/or a tethered spinal cord Psychological-Psychiatric: Anxiousness and Depressed Gastrointestinal: Reflux or heatburn and Irregular, infrequent bowel movements (Constipation) Genitourinary: Kidney disease Hematological: Bleeding easily Endocrine: No reported endocrine signs or symptoms such as high or low blood sugar, rapid heart rate due to  high thyroid levels, obesity or weight gain due to slow thyroid or thyroid disease Rheumatologic: Joint aches and or swelling due to excess weight (Osteoarthritis), Rheumatoid arthritis, and Generalized muscle aches (Fibromyalgia) Musculoskeletal: Negative for myasthenia gravis, muscular dystrophy, multiple sclerosis or malignant hyperthermia Work History: Homemaker  Allergies  Sheryl Curtis is allergic to aspirin, hydroxyquinolines, imipramine hcl, pamelor [nortriptyline hcl], prednisone, pregabalin, procardia [nifedipine], tramadol, ace inhibitors, hydroxyzine hcl, influenza vaccines, morphine and related, oxycodone-acetaminophen, gabapentin, and plaquenil [hydroxychloroquine sulfate].  Laboratory Chemistry Profile   Renal Lab Results  Component Value Date   BUN 14 01/31/2021   CREATININE 1.14 (H) 01/31/2021   BCR 17 12/24/2019   GFR 32.90 (L) 09/18/2020   GFRAA 52 (L) 01/27/2020   GFRNONAA 47 (L) 01/31/2021   PROTEINUR NEGATIVE 01/27/2020     Electrolytes Lab Results  Component Value Date   NA 134 (L) 01/31/2021   K 5.1 01/31/2021   CL 99 01/31/2021   CALCIUM 9.8 01/31/2021   MG 2.4 07/02/2015     Hepatic Lab Results  Component Value Date   AST 16 09/18/2020   ALT 20 09/18/2020   ALBUMIN 4.4 09/18/2020   ALKPHOS 46 09/18/2020   LIPASE 64 (H) 09/15/2016     ID Lab Results  Component Value Date   SARSCOV2NAA Detected (A) 11/18/2019     Bone No results found for: VD25OH, [id number], [id number], CZ6606TK1, 25OHVITD1, 25OHVITD2, 25OHVITD3, TESTOFREE, TESTOSTERONE   Endocrine Lab Results  Component Value Date   GLUCOSE 101 (H) 01/31/2021   GLUCOSEU NEGATIVE 01/27/2020   TSH 3.292 07/01/2015     Neuropathy No results found for: VITAMINB12, FOLATE, HGBA1C, HIV   CNS No results found for: COLORCSF, APPEARCSF, RBCCOUNTCSF, WBCCSF, POLYSCSF, LYMPHSCSF, EOSCSF, PROTEINCSF, GLUCCSF, JCVIRUS, CSFOLI, IGGCSF, LABACHR, ACETBL, LABACHR, ACETBL   Inflammation (CRP: Acute   ESR: Chronic) Lab Results  Component Value Date   LATICACIDVEN 1.64 09/15/2016     Rheumatology No results found for: RF, ANA, LABURIC, URICUR, LYMEIGGIGMAB, LYMEABIGMQN, HLAB27   Coagulation Lab Results  Component Value Date   INR 1.16 07/02/2015   LABPROT 15.0 07/02/2015   APTT 135 (H) 07/02/2015   PLT 230 01/31/2021   DDIMER 0.28 04/11/2014     Cardiovascular Lab Results  Component Value Date   BNP 82.0 07/01/2015   CKTOTAL 42 12/06/2019   TROPONINI 0.04 (H) 07/04/2015   HGB 13.7 01/31/2021   HCT 43.3 01/31/2021     Screening Lab Results  Component Value Date   SARSCOV2NAA Detected (A) 11/18/2019     Cancer No results found for: CEA, CA125, LABCA2   Allergens No results found for: ALMOND, APPLE, ASPARAGUS, AVOCADO, BANANA, BARLEY, BASIL, BAYLEAF, GREENBEAN, LIMABEAN, WHITEBEAN, BEEFIGE, REDBEET, BLUEBERRY, BROCCOLI, CABBAGE, MELON, CARROT, CASEIN, CASHEWNUT, CAULIFLOWER, CELERY     Note: Lab results reviewed.  [place]  Drug: Sheryl Curtis  reports no history of drug use. Alcohol:  reports no history of alcohol use. Tobacco:  reports that she quit smoking about 58 years ago. Her smoking use included cigarettes. She started smoking about 71 years ago. She has a 12.00 pack-year smoking history. She has never used smokeless tobacco. Medical:  has a past medical history of ALLERGIC RHINITIS, Atrial fibrillation ([place]), CANCER, COLORECTAL, CHEST PAIN, ATYPICAL, CKD (chronic kidney disease), DYSPNEA, Essential hypertension, benign, FIBROMYALGIA, G E R D, History of stress test, INSOMNIA, Morbid obesity ([place]), Other and unspecified angina pectoris, and Sleep apnea. Family: family history includes Bone cancer in her sister; CAD in her brother; Heart attack (age of onset: 79) in her brother; Throat cancer in her daughter.  Past Surgical History:  Procedure Laterality Date   ABDOMINAL HYSTERECTOMY     ANKLE FRACTURE SURGERY     BACK SURGERY     BREAST SURGERY     COLON SURGERY      CORONARY STENT PLACEMENT     PERCUTANEOUS CORONARY STENT INTERVENTION (PCI-S) N/A 05/19/2013   Procedure: PERCUTANEOUS CORONARY STENT INTERVENTION (PCI-S);  Surgeon: Jettie Booze, MD;  Location: [facility] CATH LAB;  Service: Cardiovascular;  Laterality: N/A;   Active Ambulatory Problems    Diagnosis Date Noted   CANCER, COLORECTAL 04/18/2010   Essential hypertension 04/18/2010   Atrial fibrillation ([place]) 04/19/2010   Allergic rhinitis 04/19/2010   G E R D 04/18/2010   Fibromyalgia 04/18/2010   INSOMNIA 04/18/2010   DYSPNEA 04/19/2010   CHEST PAIN, ATYPICAL 04/19/2010   Essential hypertension, benign    Other and unspecified angina pectoris    Coronary atherosclerosis of native coronary artery 10/12/2013   Pulmonary nodules/lesions, multiple 04/11/2014   UTI (lower urinary tract infection) 07/01/2015   Fibromyalgia syndrome 07/01/2015   Hyponatremia 07/01/2015   Urinary tract infectious disease    Elevated troponin 07/02/2015   Abdominal pain, generalized 04/07/2014   Chronic kidney disease (CKD), stage III (moderate) ([place]) 07/05/2015   CAFL (chronic airflow limitation) ([place]) 07/05/2015   Generalized OA 07/05/2015   Detrusor muscle hypertonia 07/05/2015   H/O malignant neoplasm of colon 07/05/2015   Asthma 02/19/2016   Hypertensive urgency 08/03/2016   Sinus bradycardia 08/05/2016   Nocturnal hypoxia 03/31/2017   Trochanteric bursitis, right hip 04/21/2017   OSA (obstructive sleep apnea) 07/30/2017   Mild persistent asthma with (acute) exacerbation 07/16/2018   Cough 07/16/2018   CKD (chronic kidney disease)    Morbid obesity ([place])    Physical deconditioning 12/09/2018   Neck  pain, chronic 04/21/2019   Dysphagia 06/14/2019   Mild persistent asthma without complication 48/10/6551   Bilateral sensorineural hearing loss 09/17/2019   Medication management 09/18/2020   Healthcare maintenance 09/18/2020   Chronic respiratory failure with hypoxia (HCC) 01/18/2021   Chronic  diastolic heart failure ([place]) 01/18/2021   Lumbar facet arthropathy 06/05/2021   Lumbar degenerative disc disease 06/05/2021   Lumbar facet joint pain 06/05/2021   Lumbar radicular pain 06/05/2021   Chronic pain syndrome 06/05/2021   Resolved Ambulatory Problems    Diagnosis Date Noted   Other malaise and fatigue 01/17/2014   Sepsis ([place]) 07/01/2015   Sinus tachycardia ([place]) 07/01/2015   Chest pain 07/01/2015   Hypokalemia 07/01/2015   AKI (acute kidney injury) ([place]) 07/01/2015   Blood poisoning ([place])    Acute bronchitis 04/07/2016   Sinusitis, acute maxillary 10/07/2018   Past Medical History:  Diagnosis Date   ALLERGIC RHINITIS    FIBROMYALGIA    History of stress test    Sleep apnea    Constitutional Exam  General appearance: Well nourished, well developed, and well hydrated. In no apparent acute distress Vitals:   06/05/21 1300  BP: (!) 146/51  Pulse: 84  Temp: (!) 97.2 F (36.2 C)  SpO2: 95%  Weight: 220 lb (99.8 kg)  Height: _0  (1.626 m)   BMI Assessment: Estimated body mass index is 37.76 kg/m as calculated from the following:   Height as of this encounter: _1  (1.626 m).   Weight as of this encounter: 220 lb (99.8 kg).  BMI interpretation table: BMI level Category Range association with higher incidence of chronic pain  <18 kg/m2 Underweight   18.5-24.9 kg/m2 Ideal body weight   25-29.9 kg/m2 Overweight Increased incidence by 20%  30-34.9 kg/m2 Obese (Class I) Increased incidence by 68%  35-39.9 kg/m2 Severe obesity (Class II) Increased incidence by 136%  >40 kg/m2 Extreme obesity (Class III) Increased incidence by 254%   Patient's current BMI Ideal Body weight  Body mass index is 37.76 kg/m. Ideal body weight: 54.7 kg (120 lb 9.5 oz) Adjusted ideal body weight: 72.7 kg (160 lb 5.7 oz)   BMI Readings from Last 4 Encounters:  06/05/21 37.76 kg/m  01/22/21 37.76 kg/m  01/18/21 38.17 kg/m  12/13/20 37.76 kg/m   Wt Readings from Last 4  Encounters:  06/05/21 220 lb (99.8 kg)  01/22/21 220 lb (99.8 kg)  01/18/21 222 lb 6.4 oz (100.9 kg)  12/13/20 220 lb (99.8 kg)    Psych/Mental status: Alert, oriented x 3 (person, place, & time)       Eyes: PERLA Respiratory: No evidence of acute respiratory distress  Cervical Spine Area Exam  Skin & Axial Inspection: No masses, redness, edema, swelling, or associated skin lesions Alignment: Symmetrical Functional ROM: Decreased ROM      Stability: No instability detected Muscle Tone/Strength: Functionally intact. No obvious neuro-muscular anomalies detected. Sensory (Neurological): Dermatomal pain pattern Palpation: No palpable anomalies             Upper Extremity (UE) Exam    Side: Right upper extremity  Side: Left upper extremity  Skin & Extremity Inspection: Skin color, temperature, and hair growth are WNL. No peripheral edema or cyanosis. No masses, redness, swelling, asymmetry, or associated skin lesions. No contractures.  Skin & Extremity Inspection: Skin color, temperature, and hair growth are WNL. No peripheral edema or cyanosis. No masses, redness, swelling, asymmetry, or associated skin lesions. No contractures.  Functional ROM: Unrestricted ROM  Functional ROM: Unrestricted ROM          Muscle Tone/Strength: Functionally intact. No obvious neuro-muscular anomalies detected.  Muscle Tone/Strength: Functionally intact. No obvious neuro-muscular anomalies detected.  Sensory (Neurological): Unimpaired          Sensory (Neurological): Unimpaired          Palpation: No palpable anomalies              Palpation: No palpable anomalies              Provocative Test(s):  Phalen's test: deferred Tinel's test: deferred Apley's scratch test (touch opposite shoulder):  Action 1 (Across chest): deferred Action 2 (Overhead): deferred Action 3 (LB reach): deferred   Provocative Test(s):  Phalen's test: deferred Tinel's test: deferred Apley's scratch test (touch opposite  shoulder):  Action 1 (Across chest): Decreased ROM Action 2 (Overhead): Decreased ROM Action 3 (LB reach): Decreased ROM    Thoracic Spine Area Exam  Skin & Axial Inspection: No masses, redness, or swelling Alignment: Symmetrical Functional ROM: Unrestricted ROM Stability: No instability detected Muscle Tone/Strength: Functionally intact. No obvious neuro-muscular anomalies detected. Sensory (Neurological): Unimpaired Muscle strength & Tone: No palpable anomalies Lumbar Spine Area Exam  Skin & Axial Inspection: No masses, redness, or swelling Alignment: Symmetrical Functional ROM: Pain restricted ROM       Stability: No instability detected Muscle Tone/Strength: Functionally intact. No obvious neuro-muscular anomalies detected. Sensory (Neurological): Musculoskeletal pain pattern Palpation: No palpable anomalies       Provocative Tests: Hyperextension/rotation test: (+) bilaterally for facet joint pain. Lumbar quadrant test (Kemp's test): (+) bilaterally for facet joint pain.  Gait & Posture Assessment  Ambulation: Patient ambulates using a walker Gait: Antalgic gait (limping) Posture: Difficulty standing up straight, due to pain  Lower Extremity Exam    Side: Right lower extremity  Side: Left lower extremity  Stability: No instability observed          Stability: No instability observed          Skin & Extremity Inspection: Skin color, temperature, and hair growth are WNL. No peripheral edema or cyanosis. No masses, redness, swelling, asymmetry, or associated skin lesions. No contractures.  Skin & Extremity Inspection: Skin color, temperature, and hair growth are WNL. No peripheral edema or cyanosis. No masses, redness, swelling, asymmetry, or associated skin lesions. No contractures.  Functional ROM: Pain restricted ROM                  Functional ROM: Pain restricted ROM                  Muscle Tone/Strength: Functionally intact. No obvious neuro-muscular anomalies detected.   Muscle Tone/Strength: Functionally intact. No obvious neuro-muscular anomalies detected.  Sensory (Neurological): Unimpaired        Sensory (Neurological): Unimpaired        DTR: Patellar: 1+: trace Achilles: deferred today Plantar: deferred today  DTR: Patellar: 1+: trace Achilles: deferred today Plantar: deferred today  Palpation: No palpable anomalies  Palpation: No palpable anomalies    Assessment  Primary Diagnosis & Pertinent Problem List: The primary encounter diagnosis was Lumbar facet arthropathy. Diagnoses of Lumbar degenerative disc disease, Lumbar facet joint pain, Lumbar radicular pain, Chronic pain syndrome, and Sacroiliac joint pain were also pertinent to this visit.  Visit Diagnosis (New problems to examiner): 1. Lumbar facet arthropathy   2. Lumbar degenerative disc disease   3. Lumbar facet joint pain   4. Lumbar radicular pain   5. Chronic  pain syndrome   6. Sacroiliac joint pain    Plan of Care (Initial workup plan)  I informed the patient that we will focus on interventional pain management.  She will need to continue medication management with her primary care provider especially since she is on concomitant benzodiazepine.  I informed the patient that I do not recommend Norco but I prescribed her any form of chronic opioid therapy as long as she is on a benzodiazepine.  Sheryl Curtis has a history of greater than 3 months of moderate to severe pain which is resulted in functional impairment.  The patient has tried various conservative therapeutic options such as NSAIDs, Tylenol, muscle relaxants, physical therapy which was inadequately effective.  Patient's pain is predominantly axial with physical exam and radiographic findings suggestive of facet arthropathy.Lumbar facet medial branch nerve blocks were discussed with the patient.  Risks and benefits were reviewed.  Patient would like to proceed with bilateral L3, L4, L5 medial branch nerve block.  Patient is on  Plavix for TIA and will need to stop at 7 days prior to her scheduled procedure.  She states that she has done this in the past when she did injections with Dr. Ernestina Patches.  I will also obtain x-rays of her sacroiliac joints to rule that out as a cause of her pain.  Medication management to continue with PCP.    Imaging Orders  DG Lumbar Spine Complete W/Bend  DG Si Joints    Procedure Orders  LUMBAR FACET(MEDIAL BRANCH NERVE BLOCK) MBNB    Provider-requested follow-up: Return in about 2 weeks (around 06/19/2021) for B/L L3, 4, 5 Fct block , without sedation (STOP PLAVIX 7 days prior).  I spent a total of 60 minutes reviewing chart data, face-to-face evaluation with the patient, counseling and coordination of care as detailed above.   Future Appointments  Date Time Provider [place]  07/03/2021  1:00 PM Pleasant, Eppie Gibson, RN [facility] None    Note by: Gillis Santa, MD Date: 06/05/2021; Time: 2:33 PM

## 2021-06-13 ENCOUNTER — Ambulatory Visit (HOSPITAL_BASED_OUTPATIENT_CLINIC_OR_DEPARTMENT_OTHER): Payer: Medicare Other | Admitting: Student in an Organized Health Care Education/Training Program

## 2021-06-13 ENCOUNTER — Encounter: Payer: Self-pay | Admitting: Student in an Organized Health Care Education/Training Program

## 2021-06-13 ENCOUNTER — Ambulatory Visit
Admission: RE | Admit: 2021-06-13 | Discharge: 2021-06-13 | Disposition: A | Discharge status: Home / Self Care | Payer: Medicare Other | Source: Ambulatory Visit | Attending: Student in an Organized Health Care Education/Training Program | Admitting: Student in an Organized Health Care Education/Training Program

## 2021-06-13 ENCOUNTER — Other Ambulatory Visit: Payer: Self-pay

## 2021-06-13 DIAGNOSIS — M47816 Spondylosis without myelopathy or radiculopathy, lumbar region: Secondary | ICD-10-CM

## 2021-06-13 DIAGNOSIS — M5459 Other low back pain: Secondary | ICD-10-CM

## 2021-06-13 DIAGNOSIS — G894 Chronic pain syndrome: Secondary | ICD-10-CM | POA: Insufficient documentation

## 2021-06-13 MED ORDER — ROPIVACAINE HCL 2 MG/ML IJ SOLN
9.0000 mL | Freq: Once | INTRAMUSCULAR | Status: AC
  Administered 2021-06-13: 9 mL via PERINEURAL

## 2021-06-13 MED ORDER — LIDOCAINE HCL 2 % IJ SOLN
20.0000 mL | Freq: Once | INTRAMUSCULAR | Status: AC
  Administered 2021-06-13: 400 mg

## 2021-06-13 MED ORDER — DEXAMETHASONE SODIUM PHOSPHATE 10 MG/ML IJ SOLN
10.0000 mg | Freq: Once | INTRAMUSCULAR | Status: AC
  Administered 2021-06-13: 10 mg
  Filled 2021-06-13: qty 1

## 2021-06-13 MED ORDER — LIDOCAINE HCL (PF) 2 % IJ SOLN
INTRAMUSCULAR | Status: AC
  Filled 2021-06-13: qty 20

## 2021-06-13 NOTE — Patient Instructions (Signed)

## 2021-06-13 NOTE — Progress Notes (Signed)
PROVIDER NOTE: Information contained herein reflects review and annotations entered in association with encounter. Interpretation of such information and data should be left to medically-trained personnel. Information provided to patient can be located elsewhere in the medical record under "Patient Instructions". Document created using STT-dictation technology, any transcriptional errors that may result from process are unintentional.    Patient: Sheryl Curtis  Service Category: Procedure  Provider: Gillis Santa, MD  DOB: 1932-03-22  DOS: 06/13/2021  Location: [facility]  MRN: [id number]  Setting: Ambulatory - outpatient  Referring Provider: Gillis Santa, MD  Type: Established Patient  Specialty: Interventional Pain Management  PCP: Alroy Dust, L.Marlou Sa, MD   Primary Reason for Visit: Interventional Pain Management Treatment. CC: Hip Pain (RI*GHT)  Procedure:          Anesthesia, Analgesia, Anxiolysis:  Type: Lumbar Facet, Medial Branch Block(s) #1  Primary Purpose: Diagnostic Region: Posterolateral Lumbosacral Spine Level: L3, L4, L5,  Medial Branch Level(s). Injecting these levels blocks the L3-4, L4-5, and L5-S1 lumbar facet joints. Laterality: Bilateral  Type: Local Anesthesia   Local Anesthetic: Lidocaine 1-2%  Position: Prone   Indications: 1. Lumbar facet arthropathy   2. Lumbar facet joint pain   3. Chronic pain syndrome    Pain Score: Pre-procedure: 9 /10 Post-procedure: 2  (staneding and moving)/10   Pre-op H&P Assessment:  Sheryl Curtis is a 87 y.o. (year old), female patient, seen today for interventional treatment. She  has a past surgical history that includes Back surgery; Colon surgery; Coronary stent placement; Abdominal hysterectomy; Breast surgery; percutaneous coronary stent intervention (pci-s) (N/A, 05/19/2013); and Ankle fracture surgery. Sheryl Curtis has a current medication list which includes the following prescription(s): albuterol, proair respiclick,  alprazolam, vitamin c, breztri aerosphere, breztri aerosphere, vitamin d, clopidogrel, duloxetine, furosemide, isosorbide mononitrate, levothyroxine, compressor nebulizer, nitroglycerin, fish oil, pentazocine-naloxone, polyethylene glycol, potassium chloride sa, flutter, valsartan-hydrochlorothiazide, acetaminophen, amitriptyline, debrox, diphenhydramine, docusate sodium, gabapentin, ipratropium, lidocaine, morphine, oxycodone-acetaminophen, pregabalin, rosuvastatin, and senna. Her primarily concern today is the Hip Pain (RI*GHT)  Initial Vital Signs:  Pulse/HCG Rate: 94  Temp: (!) 96.6 F (35.9 C) Resp: 16 BP: (!) 144/59 SpO2: 97 %  BMI: Estimated body mass index is 37.76 kg/m as calculated from the following:   Height as of this encounter: '5\' 4"'$  (1.626 m).   Weight as of this encounter: 220 lb (99.8 kg).  Risk Assessment: Allergies: Reviewed. She is allergic to aspirin, hydroxyquinolines, imipramine hcl, pamelor [nortriptyline hcl], prednisone, pregabalin, procardia [nifedipine], tramadol, ace inhibitors, hydroxyzine hcl, influenza vaccines, morphine and related, oxycodone-acetaminophen, gabapentin, and plaquenil [hydroxychloroquine sulfate].  Allergy Precautions: None required Coagulopathies: Reviewed. None identified.  Blood-thinner therapy: None at this time Active Infection(s): Reviewed. None identified. Sheryl Curtis is afebrile  Site Confirmation: Sheryl Curtis was asked to confirm the procedure and laterality before marking the site Procedure checklist: Completed Consent: Before the procedure and under the influence of no sedative(s), amnesic(s), or anxiolytics, the patient was informed of the treatment options, risks and possible complications. To fulfill our ethical and legal obligations, as recommended by the American Medical Association's Code of Ethics, I have informed the patient of my clinical impression; the nature and purpose of the treatment or procedure; the risks, benefits, and  possible complications of the intervention; the alternatives, including doing nothing; the risk(s) and benefit(s) of the alternative treatment(s) or procedure(s); and the risk(s) and benefit(s) of doing nothing. The patient was provided information about the general risks and possible complications associated with the procedure. These may include, but are  not limited to: failure to achieve desired goals, infection, bleeding, organ or nerve damage, allergic reactions, paralysis, and death. In addition, the patient was informed of those risks and complications associated to Spine-related procedures, such as failure to decrease pain; infection (i.e.: Meningitis, epidural or intraspinal abscess); bleeding (i.e.: epidural hematoma, subarachnoid hemorrhage, or any other type of intraspinal or peri-dural bleeding); organ or nerve damage (i.e.: Any type of peripheral nerve, nerve root, or spinal cord injury) with subsequent damage to sensory, motor, and/or autonomic systems, resulting in permanent pain, numbness, and/or weakness of one or several areas of the body; allergic reactions; (i.e.: anaphylactic reaction); and/or death. Furthermore, the patient was informed of those risks and complications associated with the medications. These include, but are not limited to: allergic reactions (i.e.: anaphylactic or anaphylactoid reaction(s)); adrenal axis suppression; blood sugar elevation that in diabetics may result in ketoacidosis or comma; water retention that in patients with history of congestive heart failure may result in shortness of breath, pulmonary edema, and decompensation with resultant heart failure; weight gain; swelling or edema; medication-induced neural toxicity; particulate matter embolism and blood vessel occlusion with resultant organ, and/or nervous system infarction; and/or aseptic necrosis of one or more joints. Finally, the patient was informed that Medicine is not an exact science; therefore, there  is also the possibility of unforeseen or unpredictable risks and/or possible complications that may result in a catastrophic outcome. The patient indicated having understood very clearly. We have given the patient no guarantees and we have made no promises. Enough time was given to the patient to ask questions, all of which were answered to the patient's satisfaction. Sheryl Curtis has indicated that she wanted to continue with the procedure. Attestation: I, the ordering provider, attest that I have discussed with the patient the benefits, risks, side-effects, alternatives, likelihood of achieving goals, and potential problems during recovery for the procedure that I have provided informed consent. Date  Time: 06/13/2021 10:40 AM  Pre-Procedure Preparation:  Monitoring: As per clinic protocol. Respiration, ETCO2, SpO2, BP, heart rate and rhythm monitor placed and checked for adequate function Safety Precautions: Patient was assessed for positional comfort and pressure points before starting the procedure. Time-out: I initiated and conducted the "Time-out" before starting the procedure, as per protocol. The patient was asked to participate by confirming the accuracy of the "Time Out" information. Verification of the correct person, site, and procedure were performed and confirmed by me, the nursing staff, and the patient. "Time-out" conducted as per Joint Commission's Universal Protocol (UP.01.01.01). Time: 1149  Description of Procedure:          Laterality: Bilateral. The procedure was performed in identical fashion on both sides. Levels:  L3, L4, L5,  Medial Branch Level(s) Area Prepped: Posterior Lumbosacral Region DuraPrep (Iodine Povacrylex [0.7% available iodine] and Isopropyl Alcohol, 74% w/w) Safety Precautions: Aspiration looking for blood return was conducted prior to all injections. At no point did we inject any substances, as a needle was being advanced. Before injecting, the patient was told  to immediately notify me if she was experiencing any new onset of "ringing in the ears, or metallic taste in the mouth". No attempts were made at seeking any paresthesias. Safe injection practices and needle disposal techniques used. Medications properly checked for expiration dates. SDV (single dose vial) medications used. After the completion of the procedure, all disposable equipment used was discarded in the proper designated medical waste containers. Local Anesthesia: Protocol guidelines were followed. The patient was positioned over the fluoroscopy table.  The area was prepped in the usual manner. The time-out was completed. The target area was identified using fluoroscopy. A 12-in long, straight, sterile hemostat was used with fluoroscopic guidance to locate the targets for each level blocked. Once located, the skin was marked with an approved surgical skin marker. Once all sites were marked, the skin (epidermis, dermis, and hypodermis), as well as deeper tissues (fat, connective tissue and muscle) were infiltrated with a small amount of a short-acting local anesthetic, loaded on a 10cc syringe with a 25G, 1.5-in  Needle. An appropriate amount of time was allowed for local anesthetics to take effect before proceeding to the next step. Local Anesthetic: Lidocaine 2.0% The unused portion of the local anesthetic was discarded in the proper designated containers. Technical explanation of process:  L3 Medial Branch Nerve Block (MBB): The target area for the L3 medial branch is at the junction of the postero-lateral aspect of the superior articular process and the superior, posterior, and medial edge of the transverse process of L4. Under fluoroscopic guidance, a Quincke needle was inserted until contact was made with os over the superior postero-lateral aspect of the pedicular shadow (target area). After negative aspiration for blood, 0.5 mL of the nerve block solution was injected without difficulty or  complication. The needle was removed intact. L4 Medial Branch Nerve Block (MBB): The target area for the L4 medial branch is at the junction of the postero-lateral aspect of the superior articular process and the superior, posterior, and medial edge of the transverse process of L5. Under fluoroscopic guidance, a Quincke needle was inserted until contact was made with os over the superior postero-lateral aspect of the pedicular shadow (target area). After negative aspiration for blood, 0.5 mL of the nerve block solution was injected without difficulty or complication. The needle was removed intact. L5 Medial Branch Nerve Block (MBB): The target area for the L5 medial branch is at the junction of the postero-lateral aspect of the superior articular process and the superior, posterior, and medial edge of the sacral ala. Under fluoroscopic guidance, a Quincke needle was inserted until contact was made with os over the superior postero-lateral aspect of the pedicular shadow (target area). After negative aspiration for blood, 0.5 mL of the nerve block solution was injected without difficulty or complication. The needle was removed intact.   Nerve block solution: 12 cc solution made of 10 cc of 0.2% ropivacaine, 2 cc of Decadron 10 mg/cc.  2 cc injected at each level above bilaterally.  Procedural Needles: 22-gauge, 5-inch, Quincke needles used for all levels.  Once the entire procedure was completed, the treated area was cleaned, making sure to leave some of the prepping solution back to take advantage of its long term bactericidal properties.      Illustration of the posterior view of the lumbar spine and the posterior neural structures. Laminae of L2 through S1 are labeled. DPRL5, dorsal primary ramus of L5; DPRS1, dorsal primary ramus of S1; DPR3, dorsal primary ramus of L3; FJ, facet (zygapophyseal) joint L3-L4; I, inferior articular process of L4; LB1, lateral branch of dorsal primary ramus of L1; IAB,  inferior articular branches from L3 medial branch (supplies L4-L5 facet joint); IBP, intermediate branch plexus; MB3, medial branch of dorsal primary ramus of L3; NR3, third lumbar nerve root; S, superior articular process of L5; SAB, superior articular branches from L4 (supplies L4-5 facet joint also); TP3, transverse process of L3.  Vitals:   06/13/21 1049 06/13/21 1145 06/13/21 1155 06/13/21 1205  BP: (!) 144/59 (!) 155/85 (!) 175/74 (!) 181/80  Pulse: 94 99 93 95  Resp: 16 (!) '25 20 20  '$ Temp: (!) 96.6 F (35.9 C)     TempSrc: Temporal     SpO2: 97% 95% 93% 95%  Weight: 220 lb (99.8 kg)     Height: '5\' 4"'$  (1.626 m)        Start Time: 1149 hrs. End Time: 1202 hrs.  Imaging Guidance (Spinal):          Type of Imaging Technique: Fluoroscopy Guidance (Spinal) Indication(s): Assistance in needle guidance and placement for procedures requiring needle placement in or near specific anatomical locations not easily accessible without such assistance. Exposure Time: Please see nurses notes. Contrast: None used. Fluoroscopic Guidance: I was personally present during the use of fluoroscopy. "Tunnel Vision Technique" used to obtain the best possible view of the target area. Parallax error corrected before commencing the procedure. "Direction-depth-direction" technique used to introduce the needle under continuous pulsed fluoroscopy. Once target was reached, antero-posterior, oblique, and lateral fluoroscopic projection used confirm needle placement in all planes. Images permanently stored in EMR. Interpretation: No contrast injected. I personally interpreted the imaging intraoperatively. Adequate needle placement confirmed in multiple planes. Permanent images saved into the patient's record.   Post-operative Assessment:  Post-procedure Vital Signs:  Pulse/HCG Rate: 95 (nsr)  Temp: (!) 96.6 F (35.9 C) Resp: 20 BP: (!) 181/80 SpO2: 95 %  EBL: None  Complications: No immediate post-treatment  complications observed by team, or reported by patient.  Note: The patient tolerated the entire procedure well. A repeat set of vitals were taken after the procedure and the patient was kept under observation following institutional policy, for this type of procedure. Post-procedural neurological assessment was performed, showing return to baseline, prior to discharge. The patient was provided with post-procedure discharge instructions, including a section on how to identify potential problems. Should any problems arise concerning this procedure, the patient was given instructions to immediately contact us, at any time, without hesitation. In any case, we plan to contact the patient by telephone for a follow-up status report regarding this interventional procedure.  Comments:  No additional relevant information.  Plan of Care  Orders:  Orders Placed This Encounter  Procedures   DG PAIN CLINIC C-ARM 1-60 MIN NO REPORT    Intraoperative interpretation by procedural physician at [facility].    Standing Status:   Standing    Number of Occurrences:   1    Order Specific Question:   Reason for exam:    Answer:   Assistance in needle guidance and placement for procedures requiring needle placement in or near specific anatomical locations not easily accessible without such assistance.     Medications ordered for procedure: Meds ordered this encounter  Medications   lidocaine (XYLOCAINE) 2 % (with pres) injection 400 mg   dexamethasone (DECADRON) injection 10 mg   dexamethasone (DECADRON) injection 10 mg   ropivacaine (PF) 2 mg/mL (0.2%) (NAROPIN) injection 9 mL   ropivacaine (PF) 2 mg/mL (0.2%) (NAROPIN) injection 9 mL   Medications administered: We administered lidocaine, dexamethasone, dexamethasone, ropivacaine (PF) 2 mg/mL (0.2%), and ropivacaine (PF) 2 mg/mL (0.2%).  See the medical record for exact dosing, route, and time of administration.  Follow-up plan:   Return in  about 4 weeks (around 07/11/2021) for Post Procedure Evaluation, virtual.      Recent Visits Date Type Provider Dept  06/05/21 Office Visit Gillis Santa, MD [facility]  Showing recent  visits within past 90 days and meeting all other requirements Today's Visits Date Type Provider Dept  06/13/21 Procedure visit Gillis Santa, MD [facility]  Showing today's visits and meeting all other requirements Future Appointments Date Type Provider Dept  07/11/21 Appointment Gillis Santa, MD [facility]  Showing future appointments within next 90 days and meeting all other requirements Disposition: Discharge home  Discharge (Date  Time): 06/13/2021; 1209 hrs.   Primary Care Physician: Alroy Dust, L.Marlou Sa, MD Location: [facility] Note by: Gillis Santa, MD Date: 06/13/2021; Time: 2:26 PM  Disclaimer:  Medicine is not an exact science. The only guarantee in medicine is that nothing is guaranteed. It is important to note that the decision to proceed with this intervention was based on the information collected from the patient. The Data and conclusions were drawn from the patient's questionnaire, the interview, and the physical examination. Because the information was provided in large part by the patient, it cannot be guaranteed that it has not been purposely or unconsciously manipulated. Every effort has been made to obtain as much relevant data as possible for this evaluation. It is important to note that the conclusions that lead to this procedure are derived in large part from the available data. Always take into account that the treatment will also be dependent on availability of resources and existing treatment guidelines, considered by other Pain Management Practitioners as being common knowledge and practice, at the time of the intervention. For Medico-Legal purposes, it is also important to point out that variation in procedural techniques and  pharmacological choices are the acceptable norm. The indications, contraindications, technique, and results of the above procedure should only be interpreted and judged by a Board-Certified Interventional Pain Specialist with extensive familiarity and expertise in the same exact procedure and technique.

## 2021-06-13 NOTE — Progress Notes (Signed)
Safety precautions to be maintained throughout the outpatient stay will include: orient to surroundings, keep bed in low position, maintain call bell within reach at all times, provide assistance with transfer out of bed and ambulation.  

## 2021-06-14 ENCOUNTER — Telehealth: Payer: Self-pay | Admitting: *Deleted

## 2021-06-14 DIAGNOSIS — H43813 Vitreous degeneration, bilateral: Secondary | ICD-10-CM | POA: Diagnosis not present

## 2021-06-14 DIAGNOSIS — Z961 Presence of intraocular lens: Secondary | ICD-10-CM | POA: Diagnosis not present

## 2021-06-14 DIAGNOSIS — H16223 Keratoconjunctivitis sicca, not specified as Sjogren's, bilateral: Secondary | ICD-10-CM | POA: Diagnosis not present

## 2021-06-14 NOTE — Telephone Encounter (Signed)
Phone call re; procedure on yesterday, spoke with her daughter and states she quite a bit of pain last evening but hopeful that she will be better today.  Reports that she did use her ice on yesterday and I told her that they could move to heat and type what ever pain medication that she uses for pain until the steroids begin to work.  Daughter verbalizes u/o information.

## 2021-06-21 ENCOUNTER — Other Ambulatory Visit: Payer: Self-pay | Admitting: Interventional Cardiology

## 2021-06-25 DIAGNOSIS — J9611 Chronic respiratory failure with hypoxia: Secondary | ICD-10-CM | POA: Diagnosis not present

## 2021-07-03 ENCOUNTER — Other Ambulatory Visit: Payer: Self-pay | Admitting: *Deleted

## 2021-07-03 NOTE — Patient Instructions (Signed)
Goals Addressed             This Visit's Progress    ([facility])Lifestyle Change-Hypertension   Not on track    Timeframe:  Long-Range Goal Priority:  Medium Start Date:   12202021                          Expected End Date:   12302022        Follow Up Date 12302022   - agree to work together to make changes - ask questions to understand - learn about high blood pressure    Why is this important?   The changes that you are asked to make may be hard to do.  This is especially true when the changes are life-long.  Knowing why it is important to you is the first step.  Working on the change with your family or support person helps you not feel alone.  Reward yourself and family or support person when goals are met. This can be an activity you choose like bowling, hiking, biking, swimming or shooting hoops.     Notes:  05252022 Patient has not made any additional changes. She is having severe back and leg pain. [id number] patient has made attempts to get pain better under control unsuccessfully.     ([facility])Track and Manage My Blood Pressure-Hypertension   Not on track    Timeframe:  Short-Term Goal Priority:  High Start Date:12202021                             Expected End Date:  12302022                     Follow Up Date 12302022   - check blood pressure weekly - choose a place to take my blood pressure (home, clinic or office, retail store) - write blood pressure results in a log or diary    Why is this important?   You won't feel high blood pressure, but it can still hurt your blood vessels.  High blood pressure can cause heart or kidney problems. It can also cause a stroke.  Making lifestyle changes like losing a little weight or eating less salt will help.  Checking your blood pressure at home and at different times of the day can help to control blood pressure.  If the doctor prescribes medicine remember to take it the way the doctor ordered.  Call the office if you cannot  afford the medicine or if there are questions about it.     Notes:  RN sent new calendar book for documentation 05252022 Patient has not been monitoring at home.  BP checked at Dr office. [id number] Patient has not been monitoring her blood pressure at home.         

## 2021-07-03 NOTE — Patient Outreach (Signed)
[facility]) Care Management  [place]  07/03/2021   Sheryl Curtis May 08, 1932 [id number]  RN Health Coach telephone call to patient.  [employer] compliance verified. Per patient she has not been checking her blood pressure at home. Per patient it has only been checked in the Dr office.  Patient stated that she has been having a lot of sciatica pain. Per patient she had an injection for the pain with no relief. Per patient she stated that she had not heard regarding her motorized chair. Her ability to get around in the house has decreased. RN notified [employer]. Patient has agreed to further outreach calls.   Encounter Medications:  Outpatient Encounter Medications as of 07/03/2021  Medication Sig Note   acetaminophen (TYLENOL) 650 MG CR tablet Take 650 mg by mouth every 8 (eight) hours as needed for pain.  (Patient not taking: Reported on 06/13/2021)    albuterol (PROVENTIL) (2.5 MG/3ML) 0.083% nebulizer solution USE 1 VIAL VIA NEBULIZER EVERY 6 HOURS AS NEEDED    Albuterol Sulfate (PROAIR RESPICLICK) 315 (90 Base) MCG/ACT AEPB Inhale 1-2 puffs into the lungs every 6 (six) hours as needed (for shortness of breath and/or wheezing.).    ALPRAZolam (XANAX) 0.25 MG tablet Take 0.25 mg by mouth 3 (three) times daily as needed for anxiety.    amitriptyline (ELAVIL) 10 MG tablet Take 1 tablet (10 mg total) by mouth at bedtime as needed for sleep. (Patient not taking: Reported on 06/13/2021)    Ascorbic Acid (VITAMIN C) 1000 MG tablet Take 1,000 mg by mouth daily.    Budeson-Glycopyrrol-Formoterol (BREZTRI AEROSPHERE) 160-9-4.8 MCG/ACT AERO Inhale 2 puffs into the lungs in the morning and at bedtime.    Budeson-Glycopyrrol-Formoterol (BREZTRI AEROSPHERE) 160-9-4.8 MCG/ACT AERO Inhale 2 puffs into the lungs in the morning and at bedtime.    carbamide peroxide (DEBROX) 6.5 % OTIC solution Place 5 drops into both ears 2 (two) times daily. (Patient not taking: No sig reported)     Cholecalciferol (VITAMIN D) 50 MCG (2000 UT) tablet Take 2,000 Units by mouth daily.    clopidogrel (PLAVIX) 75 MG tablet TAKE 1 TABLET(75 MG) BY MOUTH EVERY OTHER DAY    diphenhydrAMINE (BENADRYL ALLERGY) 25 mg capsule Take 1 capsule (25 mg total) by mouth at bedtime as needed. (Patient not taking: Reported on 06/13/2021)    docusate sodium (COLACE) 100 MG capsule Take 1 capsule (100 mg total) by mouth in the morning, at noon, and at bedtime. (Patient not taking: Reported on 06/13/2021)    DULoxetine (CYMBALTA) 60 MG capsule Take 60 mg by mouth daily.    furosemide (LASIX) 40 MG tablet TAKE 1 TABLET BY MOUTH  DAILY    gabapentin (NEURONTIN) 300 MG capsule Take 1 capsule (300 mg total) by mouth at bedtime. (Patient not taking: No sig reported)    ipratropium (ATROVENT) 0.03 % nasal spray Place 2 sprays into both nostrils every 12 (twelve) hours. (Patient not taking: No sig reported)    isosorbide mononitrate (IMDUR) 60 MG 24 hr tablet TAKE 1 TABLET BY MOUTH  DAILY    levothyroxine (SYNTHROID) 100 MCG tablet Take by mouth daily before breakfast. 06/13/2021: Unsure of dosage   lidocaine (LIDODERM) 5 % Place 1 patch onto the skin daily. Remove & Discard patch within 12 hours or as directed by MD (Patient not taking: No sig reported)    morphine (MS CONTIN) 15 MG 12 hr tablet Take 1 tablet (15 mg total) by mouth every 12 (twelve) hours. (Patient  not taking: No sig reported) 12/13/2020: Last dose: 12/04/2020, patient states that she does not like how it makes her feel   Nebulizers (COMPRESSOR NEBULIZER) MISC 1 Device by Does not apply route once.    nitroGLYCERIN (NITROSTAT) 0.4 MG SL tablet Place 1 tablet (0.4 mg total) under the tongue every 5 (five) minutes as needed for chest pain.    Omega-3 Fatty Acids (FISH OIL) 1200 MG CAPS Take 1,200 mg by mouth daily.    oxyCODONE-acetaminophen (PERCOCET) 10-325 MG tablet Take 1 tablet by mouth 2 (two) times daily as needed for pain. (Patient not taking: No sig  reported)    pentazocine-naloxone (TALWIN NX) 50-0.5 MG tablet Take 1 tablet by mouth every 4 (four) hours as needed.    polyethylene glycol (MIRALAX / GLYCOLAX) 17 g packet Take 17 g by mouth daily as needed for mild constipation.     potassium chloride SA (KLOR-CON) 20 MEQ tablet TAKE 1 TABLET BY MOUTH  DAILY    pregabalin (LYRICA) 25 MG capsule Take 1 capsule (25 mg total) by mouth 2 (two) times daily. (Patient not taking: Reported on 06/13/2021)    Respiratory Therapy Supplies (FLUTTER) DEVI Use flutter device 3 times a day    rosuvastatin (CRESTOR) 20 MG tablet TAKE 1 TABLET BY MOUTH  DAILY (Patient not taking: Reported on 06/13/2021)    senna (SENOKOT) 8.6 MG TABS tablet Take 2 tablets (17.2 mg total) by mouth at bedtime. (Patient not taking: No sig reported)    valsartan-hydrochlorothiazide (DIOVAN-HCT) 160-12.5 MG tablet Take 1 tablet by mouth daily.    No facility-administered encounter medications on file as of 07/03/2021.    Functional Status:  No flowsheet data found.  Fall/Depression Screening: Fall Risk  07/03/2021 06/13/2021 06/05/2021  Falls in the past year? 0 0 0  Comment - - -  Number falls in past yr: - - -  Injury with Fall? 1 - -  Comment - - -  Risk Factor Category  - - -  Risk for fall due to : History of fall(s);Impaired balance/gait;Impaired mobility - -  Risk for fall due to: Comment - - -  Follow up Falls evaluation completed - -  Comment - - -   PHQ 2/9 Scores 11/01/2020 05/03/2020 03/07/2020 01/21/2020 10/25/2019 07/22/2019 04/21/2019  PHQ - 2 Score 2 0 0 2 0 0 0  PHQ- 9 Score - - - 5 - - -  Exception Documentation - - - - - - -    Assessment:   Care Plan Care Plan : Hypertension (Adult)  Updates made by Verlin Grills, RN since 07/03/2021 12:00 AM     Problem: Hypertension (Hypertension)   Priority: Medium  Onset Date: 10/16/2020     Goal: Hypertension Monitored   Start Date: 10/16/2020  Expected End Date: 10/26/2021  This Visit's Progress: Not on  track  Priority: High  Note:   Evidence-based guidance:  Promote initial use of ambulatory blood pressure measurements (for 3 days) to rule out "white-coat" effect; identify masked hypertension and presence or absence of nocturnal "dipping" of blood pressure.   Encourage continued use of home blood pressure monitoring and recording in blood pressure log; include symptoms of hypotension or potential medication side effects in log.  Review blood pressure measurements taken inside and outside of the provider office; establish baseline and monitor trends; compare to target ranges or patient goal.  Share overall cardiovascular risk with patient; encourage changes to lifestyle risk factors, including alcohol consumption, smoking, inadequate exercise, poor  dietary habits and stress.   Notes:  [id number] Patient has not been monitoring her blood pressure    Problem: Disease Progression (Hypertension)   Priority: Medium  Onset Date: 10/16/2020     Long-Range Goal: Disease Progression Prevented or Minimized   Start Date: 10/16/2020  Expected End Date: 10/26/2021  This Visit's Progress: On track  Recent Progress: On track  Priority: Medium  Note:   Evidence-based guidance:  Tailor lifestyle advice to individual; review progress regularly; give frequent encouragement and respond positively to incremental successes.  Assess for and promote awareness of worsening disease or development of comorbidity.  Prepare patient for laboratory and diagnostic exams based on risk and presentation.  Prepare patient for use of pharmacologic therapy that may include diuretic, beta-blocker, beta-blocker/thiazide combination, angiotensin-converting enzyme inhibitor, renin-angiotensin blocker or calcium-channel blocker.  Expect periodic adjustments to pharmacologic therapy; manage side effects.  Promote a healthy diet that includes primarily plant-based foods, such as fruits, vegetables, whole grains, beans and legumes,  low-fat dairy and lean meats.   Consider moderate reduction in sodium intake by avoiding the addition of salt to prepared foods and limiting processed meats, canned soup, frozen meals and salty snacks.   Promote a regular, daily exercise goal of 150 minutes per week of moderate exercise based on tolerance, ability and patient choice; consider referral to physical therapist, community wellness and/or activity program.  Encourage the avoidance of no more than 2 hours per day of sedentary activity, such as recreational screen time.  Review sources of stress; explore current coping strategies and encourage use of mindfulness, yoga, meditation or exercise to manage stress.   Notes:  [id number] Patient is having a lot of sciatica pain. With the pain her blood pressure fluctuates    Problem: Resistant Hypertension (Hypertension)   Priority: Medium  Onset Date: 10/16/2020     Long-Range Goal: Response to Treatment Maximized   Start Date: 10/16/2020  Expected End Date: 10/26/2021  This Visit's Progress: On track  Priority: Medium  Note:   Evidence-based guidance:  Assess patient response to treatment, including presence or absence of medication side effects, degree of blood pressure control and patient satisfaction.  Assess technique (including cuff size and placement), measurement times, condition and calibration of blood pressure cuff set (both at-home and in-office equipment).  Assess factors that may influence response to treatment, including nonadherence to pharmacologic treatment plan, diet or activity changes and/or presence of pain, stress or sleep disturbance.  Screen for signs and symptoms of depression; if present, refer for or complete a comprehensive assessment.  Evaluate social and economic barriers that may affect adherence to treatment plan  Address pharmacologic nonadherence by simplifying dosing regimen, counseling or support by pharmacist, financial assistance, self-monitoring of  blood pressure, use of motivational interviewing, voice or text messages.  Encourage behavioral adherence strategies, like habit-based interventions that link medication taking with existing daily routines.  Assess barriers to regular, daily physical activity; support family or support person-oriented activity changes and utilization of community activity or sports program.  Address barriers to dietary changes, especially sodium restriction, with referrals to community programs, like cooking classes, meal services or intensive education when available.  Refer to community-based peer support program or nurse home-visiting program.  Assess for chronic pain; when present add additional goals (Chronic Pain Care Plan Guide) as needed.  Provide frequent follow-up by telephone, telemonitoring, patient-practice portal or with home visit.  Review alcohol use screen; address using brief intervention beginning with risk that interferes with blood pressure control; refer  for treatment when excessive alcohol use is noted.  Screen for obstructive sleep apnea; prepare patient for polysomnography based on risk and presentation and use of noninvasive ventilation to relieve obstructive sleep apnea when present.   Notes:  [id number] Patient blood pressure is controlled unless in pain.      Goals Addressed             This Visit's Progress    (THN)Lifestyle Change-Hypertension   Not on track    Timeframe:  Long-Range Goal Priority:  Medium Start Date:   [id number]                          Expected End Date:   [id number]        Follow Up Date [id number]   - agree to work together to make changes - ask questions to understand - learn about high blood pressure    Why is this important?   The changes that you are asked to make may be hard to do.  This is especially true when the changes are life-long.  Knowing why it is important to you is the first step.  Working on the change with your family or support  person helps you not feel alone.  Reward yourself and family or support person when goals are met. This can be an activity you choose like bowling, hiking, biking, swimming or shooting hoops.     Notes:  [id number] Patient has not made any additional changes. She is having severe back and leg pain. [id number] patient has made attempts to get pain better under control unsuccessfully.     (THN)Track and Manage My Blood Pressure-Hypertension   Not on track    Timeframe:  Short-Term Goal Priority:  High Start Date:12202021                             Expected End Date:  [id number]                     Follow Up Date [id number]   - check blood pressure weekly - choose a place to take my blood pressure (home, clinic or office, retail store) - write blood pressure results in a log or diary    Why is this important?   You won't feel high blood pressure, but it can still hurt your blood vessels.  High blood pressure can cause heart or kidney problems. It can also cause a stroke.  Making lifestyle changes like losing a little weight or eating less salt will help.  Checking your blood pressure at home and at different times of the day can help to control blood pressure.  If the doctor prescribes medicine remember to take it the way the doctor ordered.  Call the office if you cannot afford the medicine or if there are questions about it.     Notes:  RN sent new calendar book for documentation [id number] Patient has not been monitoring at home.  BP checked at Dr office. [id number] Patient has not been monitoring her blood pressure at home.         Plan:  Follow-up: Follow-up in 3 month(s) RN sent educational material on hypertension RN sent COPD tips RN sent eating plan for COPD RN sent physical activity for COPD RN notified [employer] on motorized chair RN sent update assessment to PCP  [place] [facility] [phone]

## 2021-07-11 ENCOUNTER — Other Ambulatory Visit: Payer: Self-pay | Admitting: Family Medicine

## 2021-07-11 ENCOUNTER — Ambulatory Visit
Payer: Medicare Other | Attending: Student in an Organized Health Care Education/Training Program | Admitting: Student in an Organized Health Care Education/Training Program

## 2021-07-11 ENCOUNTER — Other Ambulatory Visit: Payer: Self-pay

## 2021-07-11 ENCOUNTER — Ambulatory Visit
Admission: RE | Admit: 2021-07-11 | Discharge: 2021-07-11 | Disposition: A | Discharge status: Home / Self Care | Payer: Medicare Other | Source: Ambulatory Visit | Attending: Family Medicine | Admitting: Family Medicine

## 2021-07-11 DIAGNOSIS — J441 Chronic obstructive pulmonary disease with (acute) exacerbation: Secondary | ICD-10-CM

## 2021-07-11 DIAGNOSIS — R059 Cough, unspecified: Secondary | ICD-10-CM | POA: Diagnosis not present

## 2021-07-11 DIAGNOSIS — M47816 Spondylosis without myelopathy or radiculopathy, lumbar region: Secondary | ICD-10-CM

## 2021-07-11 DIAGNOSIS — R0981 Nasal congestion: Secondary | ICD-10-CM | POA: Diagnosis not present

## 2021-07-11 DIAGNOSIS — R0602 Shortness of breath: Secondary | ICD-10-CM | POA: Diagnosis not present

## 2021-07-11 NOTE — Progress Notes (Signed)
I attempted to call the patient however no response. Voicemail left instructing patient to call front desk office at [phone] to reschedule appointment. -Dr Holley Raring   Post-Procedure Evaluation  Procedure (06/13/2021):   Type: Lumbar Facet, Medial Branch Block(s) #1  Primary Purpose: Diagnostic Region: Posterolateral Lumbosacral Spine Level: L3, L4, L5,  Medial Branch Level(s). Injecting these levels blocks the L3-4, L4-5, and L5-S1 lumbar facet joints. Laterality: Bilateral  Anxiolysis: Please see nurses note.  Effectiveness during initial hour after procedure (Ultra-Short Term Relief): 0 %  Local anesthetic used: Long-acting (4-6 hours) Effectiveness: Defined as any analgesic benefit obtained secondary to the administration of local anesthetics. This carries significant diagnostic value as to the etiological location, or anatomical origin, of the pain. Duration of benefit is expected to coincide with the duration of the local anesthetic used.  Effectiveness during initial 4-6 hours after procedure (Short-Term Relief): 0 %   Long-term benefit: Defined as any relief past the pharmacologic duration of the local anesthetics.  Effectiveness past the initial 6 hours after procedure (Long-Term Relief): 0 %   Benefits, current: Defined as benefit present at the time of this evaluation.   Analgesia:  0% Function: No benefit ROM: No benefit

## 2021-07-19 ENCOUNTER — Other Ambulatory Visit: Payer: Self-pay | Admitting: Interventional Cardiology

## 2021-07-26 DIAGNOSIS — J9611 Chronic respiratory failure with hypoxia: Secondary | ICD-10-CM | POA: Diagnosis not present

## 2021-08-06 DIAGNOSIS — H9113 Presbycusis, bilateral: Secondary | ICD-10-CM | POA: Diagnosis not present

## 2021-08-18 ENCOUNTER — Other Ambulatory Visit: Payer: Self-pay

## 2021-08-18 ENCOUNTER — Observation Stay (HOSPITAL_COMMUNITY)
Admission: EM | Admit: 2021-08-18 | Discharge: 2021-08-21 | Disposition: A | Discharge status: Home / Self Care | Payer: Medicare Other | Attending: Internal Medicine | Admitting: Internal Medicine

## 2021-08-18 ENCOUNTER — Emergency Department (HOSPITAL_COMMUNITY): Payer: Medicare Other

## 2021-08-18 DIAGNOSIS — Z7902 Long term (current) use of antithrombotics/antiplatelets: Secondary | ICD-10-CM | POA: Diagnosis not present

## 2021-08-18 DIAGNOSIS — Z20822 Contact with and (suspected) exposure to covid-19: Secondary | ICD-10-CM | POA: Insufficient documentation

## 2021-08-18 DIAGNOSIS — Z955 Presence of coronary angioplasty implant and graft: Secondary | ICD-10-CM | POA: Insufficient documentation

## 2021-08-18 DIAGNOSIS — Z79899 Other long term (current) drug therapy: Secondary | ICD-10-CM | POA: Diagnosis not present

## 2021-08-18 DIAGNOSIS — I4891 Unspecified atrial fibrillation: Secondary | ICD-10-CM | POA: Diagnosis not present

## 2021-08-18 DIAGNOSIS — I5032 Chronic diastolic (congestive) heart failure: Secondary | ICD-10-CM | POA: Diagnosis not present

## 2021-08-18 DIAGNOSIS — Z85048 Personal history of other malignant neoplasm of rectum, rectosigmoid junction, and anus: Secondary | ICD-10-CM | POA: Insufficient documentation

## 2021-08-18 DIAGNOSIS — R0789 Other chest pain: Principal | ICD-10-CM | POA: Insufficient documentation

## 2021-08-18 DIAGNOSIS — J449 Chronic obstructive pulmonary disease, unspecified: Secondary | ICD-10-CM | POA: Insufficient documentation

## 2021-08-18 DIAGNOSIS — I2510 Atherosclerotic heart disease of native coronary artery without angina pectoris: Secondary | ICD-10-CM | POA: Insufficient documentation

## 2021-08-18 DIAGNOSIS — R079 Chest pain, unspecified: Secondary | ICD-10-CM

## 2021-08-18 DIAGNOSIS — J45901 Unspecified asthma with (acute) exacerbation: Secondary | ICD-10-CM | POA: Diagnosis not present

## 2021-08-18 DIAGNOSIS — N1832 Chronic kidney disease, stage 3b: Secondary | ICD-10-CM | POA: Diagnosis not present

## 2021-08-18 DIAGNOSIS — Z87891 Personal history of nicotine dependence: Secondary | ICD-10-CM | POA: Diagnosis not present

## 2021-08-18 LAB — CBC
HCT: 45.4 % (ref 36.0–46.0)
Hemoglobin: 14.6 g/dL (ref 12.0–15.0)
MCH: 31.7 pg (ref 26.0–34.0)
MCHC: 32.2 g/dL (ref 30.0–36.0)
MCV: 98.7 fL (ref 80.0–100.0)
Platelets: 244 10*3/uL (ref 150–400)
RBC: 4.6 MIL/uL (ref 3.87–5.11)
RDW: 13.6 % (ref 11.5–15.5)
WBC: 9.9 10*3/uL (ref 4.0–10.5)
nRBC: 0 % (ref 0.0–0.2)

## 2021-08-18 LAB — BASIC METABOLIC PANEL
Anion gap: 10 (ref 5–15)
BUN: 15 mg/dL (ref 8–23)
CO2: 27 mmol/L (ref 22–32)
Calcium: 9.4 mg/dL (ref 8.9–10.3)
Chloride: 101 mmol/L (ref 98–111)
Creatinine, Ser: 1.38 mg/dL — ABNORMAL HIGH (ref 0.44–1.00)
GFR, Estimated: 37 mL/min — ABNORMAL LOW (ref >60)
Glucose, Bld: 164 mg/dL — ABNORMAL HIGH (ref 70–99)
Potassium: 5 mmol/L (ref 3.5–5.1)
Sodium: 138 mmol/L (ref 135–145)

## 2021-08-18 LAB — TROPONIN I (HIGH SENSITIVITY)
Troponin I (High Sensitivity): 13 ng/L (ref <18)
Troponin I (High Sensitivity): 29 ng/L — ABNORMAL HIGH (ref <18)

## 2021-08-18 LAB — RESP PANEL BY RT-PCR (FLU A&B, COVID) ARPGX2
Influenza A by PCR: NEGATIVE
Influenza B by PCR: NEGATIVE
SARS Coronavirus 2 by RT PCR: NEGATIVE

## 2021-08-18 LAB — PROTIME-INR
INR: 0.9 (ref 0.8–1.2)
Prothrombin Time: 12.6 seconds (ref 11.4–15.2)

## 2021-08-18 LAB — BRAIN NATRIURETIC PEPTIDE: B Natriuretic Peptide: 45.6 pg/mL (ref 0.0–100.0)

## 2021-08-18 MED ORDER — DILTIAZEM HCL ER COATED BEADS 120 MG PO CP24
120.0000 mg | ORAL_CAPSULE | Freq: Every day | ORAL | Status: DC
  Administered 2021-08-18 – 2021-08-21 (×4): 120 mg via ORAL
  Filled 2021-08-18 (×5): qty 1

## 2021-08-18 MED ORDER — PSYLLIUM 95 % PO PACK
1.0000 | PACK | Freq: Every day | ORAL | Status: DC
  Administered 2021-08-20 – 2021-08-21 (×2): 1 via ORAL
  Filled 2021-08-18 (×5): qty 1

## 2021-08-18 MED ORDER — FUROSEMIDE 20 MG PO TABS
20.0000 mg | ORAL_TABLET | Freq: Every day | ORAL | Status: DC
  Administered 2021-08-18 – 2021-08-19 (×2): 20 mg via ORAL
  Filled 2021-08-18 (×2): qty 1

## 2021-08-18 MED ORDER — IRBESARTAN 150 MG PO TABS
150.0000 mg | ORAL_TABLET | Freq: Every day | ORAL | Status: DC
  Administered 2021-08-18 – 2021-08-19 (×2): 150 mg via ORAL
  Filled 2021-08-18 (×4): qty 1

## 2021-08-18 MED ORDER — DULOXETINE HCL 60 MG PO CPEP
60.0000 mg | ORAL_CAPSULE | Freq: Every day | ORAL | Status: DC
  Administered 2021-08-18 – 2021-08-21 (×4): 60 mg via ORAL
  Filled 2021-08-18 (×4): qty 1

## 2021-08-18 MED ORDER — ALBUTEROL SULFATE (2.5 MG/3ML) 0.083% IN NEBU
2.5000 mg | INHALATION_SOLUTION | Freq: Four times a day (QID) | RESPIRATORY_TRACT | Status: DC | PRN
  Filled 2021-08-18: qty 3

## 2021-08-18 MED ORDER — ALBUTEROL SULFATE 108 (90 BASE) MCG/ACT IN AEPB: 1.0000 | INHALATION_SPRAY | Freq: Four times a day (QID) | RESPIRATORY_TRACT | Status: DC | PRN

## 2021-08-18 MED ORDER — HEPARIN SODIUM (PORCINE) 5000 UNIT/ML IJ SOLN
5000.0000 [IU] | Freq: Two times a day (BID) | INTRAMUSCULAR | Status: DC
  Administered 2021-08-18 – 2021-08-19 (×3): 5000 [IU] via SUBCUTANEOUS
  Filled 2021-08-18 (×3): qty 1

## 2021-08-18 MED ORDER — ISOSORBIDE MONONITRATE ER 60 MG PO TB24
60.0000 mg | ORAL_TABLET | Freq: Every day | ORAL | Status: DC
  Administered 2021-08-19 – 2021-08-21 (×3): 60 mg via ORAL
  Filled 2021-08-18 (×3): qty 1

## 2021-08-18 MED ORDER — DILTIAZEM LOAD VIA INFUSION
10.0000 mg | Freq: Once | INTRAVENOUS | Status: DC
  Filled 2021-08-18: qty 10

## 2021-08-18 MED ORDER — ALPRAZOLAM 0.25 MG PO TABS
0.2500 mg | ORAL_TABLET | Freq: Three times a day (TID) | ORAL | Status: DC | PRN
  Administered 2021-08-18 – 2021-08-20 (×3): 0.25 mg via ORAL
  Filled 2021-08-18 (×3): qty 1

## 2021-08-18 MED ORDER — POLYETHYLENE GLYCOL 3350 17 GM/SCOOP PO POWD: 17.0000 g | Freq: Every day | ORAL | Status: DC | PRN

## 2021-08-18 MED ORDER — PSYLLIUM 58.6 % PO POWD: 1.0000 | Freq: Every day | ORAL | Status: DC

## 2021-08-18 MED ORDER — MELATONIN 3 MG PO TABS
3.0000 mg | ORAL_TABLET | Freq: Every evening | ORAL | Status: DC | PRN
  Administered 2021-08-18 – 2021-08-20 (×3): 3 mg via ORAL
  Filled 2021-08-18 (×3): qty 1

## 2021-08-18 MED ORDER — DILTIAZEM HCL-DEXTROSE 125-5 MG/125ML-% IV SOLN (PREMIX)
5.0000 mg/h | INTRAVENOUS | Status: DC
  Administered 2021-08-18: 5 mg/h via INTRAVENOUS
  Filled 2021-08-18: qty 125

## 2021-08-18 MED ORDER — BUDESON-GLYCOPYRROL-FORMOTEROL 160-9-4.8 MCG/ACT IN AERO: 2.0000 | INHALATION_SPRAY | Freq: Every day | RESPIRATORY_TRACT | Status: DC

## 2021-08-18 MED ORDER — UMECLIDINIUM BROMIDE 62.5 MCG/ACT IN AEPB
1.0000 | INHALATION_SPRAY | Freq: Every day | RESPIRATORY_TRACT | Status: DC
  Administered 2021-08-20 – 2021-08-21 (×2): 1 via RESPIRATORY_TRACT
  Filled 2021-08-18: qty 7

## 2021-08-18 MED ORDER — METOPROLOL TARTRATE 5 MG/5ML IV SOLN: 2.5000 mg | Freq: Four times a day (QID) | INTRAVENOUS | Status: DC | PRN

## 2021-08-18 MED ORDER — FLUTICASONE FUROATE-VILANTEROL 100-25 MCG/ACT IN AEPB
1.0000 | INHALATION_SPRAY | Freq: Every day | RESPIRATORY_TRACT | Status: DC
  Administered 2021-08-20 – 2021-08-21 (×2): 1 via RESPIRATORY_TRACT
  Filled 2021-08-18: qty 28

## 2021-08-18 MED ORDER — CLOPIDOGREL BISULFATE 75 MG PO TABS
75.0000 mg | ORAL_TABLET | ORAL | Status: DC
  Administered 2021-08-18: 75 mg via ORAL
  Filled 2021-08-18: qty 1

## 2021-08-18 NOTE — ED Notes (Signed)
Pt resting in stretcher, sinus tach on the monitor, family member at bedside. Pt denies needs, no distress noted, call light in reach.

## 2021-08-18 NOTE — ED Provider Notes (Signed)
Care was taken over from Dr. Stark Jock.  Patient was seen for chest pain.  She was noted to be in A. fib with RVR.  This converted spontaneously prior to treatment.  Her chest pain resolved although she had reported some intermittent pain between her shoulder blades.  She has no pain now.  She did get short of breath with ambulation to the bathroom although her oxygen saturations remained in the upper 90s.  No ongoing chest pain.  I spoke with Dr. Quentin Ore with cardiology.  They will consult on the patient and recommend hospitalist admission.  I spoke with Dr. Roosevelt Locks who will admit the patient.  Of note, there is a several hour delay for admission with delay in getting callbacks from both the cardiologist and the hospitalist.   Malvin Johns, MD 08/18/21 1236

## 2021-08-18 NOTE — H&P (Signed)
History and Physical    Sheryl Curtis BHA:[id number] DOB: 1932/01/12 DOA: 08/18/2021  PCP: Alroy Dust, L.Marlou Sa, MD (Confirm with patient/family/NH records and if not entered, this has to be entered at [facility] point of entry) Patient coming from: Home  I have personally briefly reviewed patient's old medical records in [place]  Chief Complaint: Feeling better  HPI: Sheryl Curtis is a 87 y.o. female with medical history significant of questionable history of A. fib, CAD with stenting on Plavix, HTN, hemorrhoid with intermittent rectal bleed, CKD stage II, morbid obesity, COPD, OSA, presented with palpitations and chest pains.  Symptoms started few days ago, episodic, dull like pain between shoulder blades, episodic, resolved based on no sensation palpitations or chest pains no shortness of breath.  However, this morning, patient woke up with severe chest pain radiated to the bilateral shoulders, associated with palpitations, she took 2 nitro with partial relief.Marland Kitchen  EMS was called and found patient heart rate 150s, EKG showed A. Fib.  Neither patient nor daughter can recall patient has A. fib diagnosed before.  The patient did admit that she had a stroke episode last year, not on Ziopatch before.  ED Course: A. fib, heart rate in 160s, patient was briefly on Cardizem drip and then heart rate converted back to sinus rhythm.  And chest pain also relieved.  Blood pressure stable, troponins 13> 29.  Review of Systems: As per HPI otherwise 14 point review of systems negative.    Past Medical History:  Diagnosis Date   ALLERGIC RHINITIS    Atrial fibrillation (HCC)    CANCER, COLORECTAL    CHEST PAIN, ATYPICAL    CKD (chronic kidney disease)    DYSPNEA    Essential hypertension, benign    FIBROMYALGIA    G E R D    History of stress test    Myoview 8/16:  EF 69%, anterior and apical defect c/w breast attenuation; Low Risk    INSOMNIA    Morbid obesity ([employer])    Other and unspecified  angina pectoris    Sleep apnea     Past Surgical History:  Procedure Laterality Date   ABDOMINAL HYSTERECTOMY     ANKLE FRACTURE SURGERY     BACK SURGERY     BREAST SURGERY     COLON SURGERY     CORONARY STENT PLACEMENT     PERCUTANEOUS CORONARY STENT INTERVENTION (PCI-S) N/A 05/19/2013   Procedure: PERCUTANEOUS CORONARY STENT INTERVENTION (PCI-S);  Surgeon: Jettie Booze, MD;  Location: [facility] CATH LAB;  Service: Cardiovascular;  Laterality: N/A;     reports that she quit smoking about 58 years ago. Her smoking use included cigarettes. She started smoking about 71 years ago. She has a 12.00 pack-year smoking history. She has never used smokeless tobacco. She reports that she does not drink alcohol and does not use drugs.  Allergies  Allergen Reactions   Aspirin Anaphylaxis, Hives and Shortness Of Breath   Hydroxyquinolines Hives   Imipramine Hcl Other (See Comments)    Unknown reaction Other reaction(s): weakness/groggy   Pamelor [Nortriptyline Hcl] Other (See Comments)    Unknown reaction   Prednisone Shortness Of Breath, Itching and Other (See Comments)    anxiety   Pregabalin Other (See Comments)    Gives her the shakes   Procardia [Nifedipine] Other (See Comments)    Unknown reaction   Tramadol Swelling   Ace Inhibitors Other (See Comments)    cough   Hydroxyzine Hcl Itching  Influenza Vaccines Other (See Comments)    High fever, couldn't get out of bed   Morphine And Related Other (See Comments)    Felt 'out of sorts'   Oxycodone-Acetaminophen     Other reaction(s): Brain fog   Gabapentin Anxiety   Plaquenil [Hydroxychloroquine Sulfate] Rash    Family History  Problem Relation Age of Onset   CAD Brother    Heart attack Brother 38   Bone cancer Sister    Throat cancer Daughter      Prior to Admission medications   Medication Sig Start Date End Date Taking? Authorizing Provider  albuterol (PROVENTIL) (2.5 MG/3ML) 0.083% nebulizer solution USE 1 VIAL VIA  NEBULIZER EVERY 6 HOURS AS NEEDED Patient taking differently: Take 2.5 mg by nebulization every 6 (six) hours as needed for shortness of breath. 02/20/21  Yes Rigoberto Noel, MD  Albuterol Sulfate (PROAIR RESPICLICK) 650 (90 Base) MCG/ACT AEPB Inhale 1-2 puffs into the lungs every 6 (six) hours as needed (for shortness of breath and/or wheezing.). 10/31/20  Yes Rigoberto Noel, MD  ALPRAZolam Duanne Moron) 0.25 MG tablet Take 0.25 mg by mouth 3 (three) times daily as needed for anxiety.   Yes [provider]  Budeson-Glycopyrrol-Formoterol (BREZTRI AEROSPHERE) 160-9-4.8 MCG/ACT AERO Inhale 2 puffs into the lungs in the morning and at bedtime. 09/18/20  Yes Lauraine Rinne, NP  clopidogrel (PLAVIX) 75 MG tablet TAKE 1 TABLET(75 MG) BY MOUTH EVERY OTHER DAY Patient taking differently: Take 75 mg by mouth every other day. 10/29/16  Yes Jettie Booze, MD  DULoxetine (CYMBALTA) 60 MG capsule Take 60 mg by mouth daily. 09/25/17  Yes [provider]  furosemide (LASIX) 40 MG tablet TAKE 1 TABLET BY MOUTH  DAILY Patient taking differently: Take 20 mg by mouth daily. 06/22/21  Yes Jettie Booze, MD  isosorbide mononitrate (IMDUR) 60 MG 24 hr tablet TAKE 1 TABLET BY MOUTH  DAILY Patient taking differently: Take 60 mg by mouth daily. 07/20/21  Yes Jettie Booze, MD  Nebulizers (COMPRESSOR NEBULIZER) MISC 1 Device by Does not apply route once. 04/04/16  Yes Young, Tarri Fuller D, MD  nitroGLYCERIN (NITROSTAT) 0.4 MG SL tablet Place 1 tablet (0.4 mg total) under the tongue every 5 (five) minutes as needed for chest pain. 12/24/19  Yes Jettie Booze, MD  polyethylene glycol powder (GLYCOLAX/MIRALAX) 17 GM/SCOOP powder Take 17 g by mouth daily as needed for mild constipation.   Yes [provider]  potassium chloride SA (KLOR-CON) 20 MEQ tablet TAKE 1 TABLET BY MOUTH  DAILY Patient taking differently: Take 20 mEq by mouth daily. 02/15/21  Yes Jettie Booze, MD  psyllium  (METAMUCIL) 58.6 % powder Take 1 packet by mouth daily.   Yes [provider]  Respiratory Therapy Supplies (FLUTTER) DEVI Use flutter device 3 times a day 07/22/18  Yes Martyn Ehrich, NP  valsartan (DIOVAN) 160 MG tablet Take 160 mg by mouth daily.   Yes [provider]  amitriptyline (ELAVIL) 10 MG tablet Take 1 tablet (10 mg total) by mouth at bedtime as needed for sleep. Patient not taking: No sig reported 09/11/20   Raulkar, Clide Deutscher, MD  carbamide peroxide (DEBROX) 6.5 % OTIC solution Place 5 drops into both ears 2 (two) times daily. Patient not taking: No sig reported 04/05/20   Raulkar, Clide Deutscher, MD  diphenhydrAMINE (BENADRYL ALLERGY) 25 mg capsule Take 1 capsule (25 mg total) by mouth at bedtime as needed. Patient not taking: No sig reported  12/28/19   Raulkar, Clide Deutscher, MD  docusate sodium (COLACE) 100 MG capsule Take 1 capsule (100 mg total) by mouth in the morning, at noon, and at bedtime. Patient not taking: No sig reported 05/03/20   Raulkar, Clide Deutscher, MD  gabapentin (NEURONTIN) 300 MG capsule Take 1 capsule (300 mg total) by mouth at bedtime. Patient not taking: No sig reported 04/19/20   Mcarthur Rossetti, MD  ipratropium (ATROVENT) 0.03 % nasal spray Place 2 sprays into both nostrils every 12 (twelve) hours. Patient not taking: No sig reported 10/06/18   Martyn Ehrich, NP  lidocaine (LIDODERM) 5 % Place 1 patch onto the skin daily. Remove & Discard patch within 12 hours or as directed by MD Patient not taking: No sig reported 12/13/20   Raulkar, Clide Deutscher, MD  morphine (MS CONTIN) 15 MG 12 hr tablet Take 1 tablet (15 mg total) by mouth every 12 (twelve) hours. Patient not taking: No sig reported 11/27/20   Raulkar, Clide Deutscher, MD  oxyCODONE-acetaminophen (PERCOCET) 10-325 MG tablet Take 1 tablet by mouth 2 (two) times daily as needed for pain. Patient not taking: No sig reported 12/13/20   Raulkar, Clide Deutscher, MD  pregabalin (LYRICA) 25 MG capsule Take 1  capsule (25 mg total) by mouth 2 (two) times daily. Patient not taking: No sig reported 12/13/20   Raulkar, Clide Deutscher, MD  rosuvastatin (CRESTOR) 20 MG tablet TAKE 1 TABLET BY MOUTH  DAILY Patient not taking: No sig reported 11/23/20   Jettie Booze, MD  senna (SENOKOT) 8.6 MG TABS tablet Take 2 tablets (17.2 mg total) by mouth at bedtime. Patient not taking: No sig reported 05/03/20   Izora Ribas, MD    Physical Exam: Vitals:   08/18/21 1030 08/18/21 1130 08/18/21 1200 08/18/21 1230  BP: (!) 150/81 113/71 (!) 165/47 (!) 168/40  Pulse: 83 91 90 93  Resp: (!) 21 (!) 30 (!) 23 (!) 21  Temp:      TempSrc:      SpO2: 93% 95% 95% 96%  Weight:      Height:        Constitutional: NAD, calm, comfortable Vitals:   08/18/21 1030 08/18/21 1130 08/18/21 1200 08/18/21 1230  BP: (!) 150/81 113/71 (!) 165/47 (!) 168/40  Pulse: 83 91 90 93  Resp: (!) 21 (!) 30 (!) 23 (!) 21  Temp:      TempSrc:      SpO2: 93% 95% 95% 96%  Weight:      Height:       Eyes: PERRL, lids and conjunctivae normal ENMT: Mucous membranes are moist. Posterior pharynx clear of any exudate or lesions.Normal dentition.  Neck: normal, supple, no masses, no thyromegaly Respiratory: clear to auscultation bilaterally, no wheezing, no crackles. Normal respiratory effort. No accessory muscle use.  Cardiovascular: Regular rate and rhythm, no murmurs / rubs / gallops. No extremity edema. 2+ pedal pulses. No carotid bruits.  Abdomen: no tenderness, no masses palpated. No hepatosplenomegaly. Bowel sounds positive.  Musculoskeletal: no clubbing / cyanosis. No joint deformity upper and lower extremities. Good ROM, no contractures. Normal muscle tone.  Skin: no rashes, lesions, ulcers. No induration Neurologic: CN 2-12 grossly intact. Sensation intact, DTR normal. Strength 5/5 in all 4.  Psychiatric: Normal judgment and insight. Alert and oriented x 3. Normal mood.     Labs on Admission: I have personally reviewed  following labs and imaging studies  CBC: Recent Labs  Lab 08/18/21 0448  WBC 9.9  HGB 14.6  HCT 45.4  MCV 98.7  PLT 527   Basic Metabolic Panel: Recent Labs  Lab 08/18/21 0448  NA 138  K 5.0  CL 101  CO2 27  GLUCOSE 164*  BUN 15  CREATININE 1.38*  CALCIUM 9.4   GFR: Estimated Creatinine Clearance: 34.8 mL/min (A) (by C-G formula based on SCr of 1.38 mg/dL (H)). Liver Function Tests: No results for input(s): AST, ALT, ALKPHOS, BILITOT, PROT, ALBUMIN in the last 168 hours. No results for input(s): LIPASE, AMYLASE in the last 168 hours. No results for input(s): AMMONIA in the last 168 hours. Coagulation Profile: Recent Labs  Lab 08/18/21 0448  INR 0.9   Cardiac Enzymes: No results for input(s): CKTOTAL, CKMB, CKMBINDEX, TROPONINI in the last 168 hours. BNP (last 3 results) Recent Labs    09/18/20 1130  PROBNP 271   HbA1C: No results for input(s): HGBA1C in the last 72 hours. CBG: No results for input(s): GLUCAP in the last 168 hours. Lipid Profile: No results for input(s): CHOL, HDL, LDLCALC, TRIG, CHOLHDL, LDLDIRECT in the last 72 hours. Thyroid Function Tests: No results for input(s): TSH, T4TOTAL, FREET4, T3FREE, THYROIDAB in the last 72 hours. Anemia Panel: No results for input(s): VITAMINB12, FOLATE, FERRITIN, TIBC, IRON, RETICCTPCT in the last 72 hours. Urine analysis:    Component Value Date/Time   COLORURINE STRAW (A) 01/27/2020 1925   APPEARANCEUR CLEAR 01/27/2020 1925   LABSPEC 1.004 (L) 01/27/2020 1925   PHURINE 7.0 01/27/2020 1925   GLUCOSEU NEGATIVE 01/27/2020 1925   HGBUR NEGATIVE 01/27/2020 1925   BILIRUBINUR NEGATIVE 01/27/2020 1925   KETONESUR NEGATIVE 01/27/2020 1925   PROTEINUR NEGATIVE 01/27/2020 1925   UROBILINOGEN 0.2 07/01/2015 1850   NITRITE NEGATIVE 01/27/2020 1925   LEUKOCYTESUR NEGATIVE 01/27/2020 1925    Radiological Exams on Admission: DG Chest 2 View  Result Date: 08/18/2021 CLINICAL DATA:  Chest pain EXAM: CHEST -  2 VIEW COMPARISON:  07/11/2021 FINDINGS: Stable cardiomediastinal contours. Aortic atherosclerotic calcifications. No signs of pleural effusion or edema. No airspace consolidation. Osseous structures appear intact. IMPRESSION: No active cardiopulmonary abnormalities. Electronically Signed   By: Kerby Moors M.D.   On: 08/18/2021 06:53    EKG: Independently reviewed. Sinus, no ST-T changes.  Assessment/Plan Active Problems:   A-fib ([place])  (please populate well all problems here in Problem List. (For example, if patient is on BP meds at home and you resume or decide to hold them, it is a problem that needs to be her. Same for CAD, COPD, HLD and so on)  PAF -Resolved. -Will use PRN metoprolol for now. -CHADS2=3, systemic anticoagulation indicated.  However patient has seemingly poorly controlled rectal bleed, recommend outpatient GI consultation before starting anticoagulation.  Continue Plavix for now.  Chest pain with positive tropnins with baseline CAD -Angina-like, likely secondary to uncontrolled A. fib. -Had a normal stress test March this year. -Cardiology on board.  HTN -Controlled, continue home regimen ARB and Imdur.  COPD -No acute issue.  CKD stage II -Creatinine is stable, euvolemic, continue p.o. Lasix.  DVT prophylaxis: Heparin subcu Code Status: Full code Family Communication: Daughter at bedside Disposition Plan: Expect less than 2 midnight hospital stay Consults called: Cardiology Admission status: Tele obs  Lequita Halt MD [facility] Pager [phone]  08/18/2021, 1:01 PM

## 2021-08-18 NOTE — ED Triage Notes (Signed)
Patient reports central chest pain radiating to upper back with SOB this morning , no emesis or diaphoresis , history of CAD/Coronary stents , she took 2 NTG sl with mild relief. Tachycardic HR= 150's at arrival .

## 2021-08-18 NOTE — ED Provider Notes (Signed)
[facility] EMERGENCY DEPARTMENT Provider Note   CSN: [id number] Arrival date & time: 08/18/21  4627     History Chief Complaint  Patient presents with   Chest Pain    Sheryl Curtis is a 87 y.o. female.  Patient is an 87 year old female with past medical history of chronic renal insufficiency, coronary artery disease with stent, fibromyalgia.  Patient presenting today with complaints of chest discomfort.  This woke her from sleep approximately 2 hours prior to presentation.  She describes tightness in her chest that radiates through into her back.  She took 2 nitroglycerin at home with no relief.  She was then brought here by her daughter for evaluation.  She denies any fevers or chills.  She denies any leg swelling.  The history is provided by the patient.      Past Medical History:  Diagnosis Date   ALLERGIC RHINITIS    Atrial fibrillation (HCC)    CANCER, COLORECTAL    CHEST PAIN, ATYPICAL    CKD (chronic kidney disease)    DYSPNEA    Essential hypertension, benign    FIBROMYALGIA    G E R D    History of stress test    Myoview 8/16:  EF 69%, anterior and apical defect c/w breast attenuation; Low Risk    INSOMNIA    Morbid obesity ([place])    Other and unspecified angina pectoris    Sleep apnea     Patient Active Problem List   Diagnosis Date Noted   Lumbar facet arthropathy 06/05/2021   Lumbar degenerative disc disease 06/05/2021   Lumbar facet joint pain 06/05/2021   Lumbar radicular pain 06/05/2021   Chronic pain syndrome 06/05/2021   Chronic respiratory failure with hypoxia ([place]) 01/18/2021   Chronic diastolic heart failure ([place]) 01/18/2021   Medication management 09/18/2020   Healthcare maintenance 09/18/2020   Mild persistent asthma without complication 03/50/0938   Bilateral sensorineural hearing loss 09/17/2019   Dysphagia 06/14/2019   Neck pain, chronic 04/21/2019   Physical deconditioning 12/09/2018   CKD (chronic kidney disease)     Morbid obesity (HCC)    Mild persistent asthma with (acute) exacerbation 07/16/2018   Cough 07/16/2018   OSA (obstructive sleep apnea) 07/30/2017   Trochanteric bursitis, right hip 04/21/2017   Nocturnal hypoxia 03/31/2017   Sinus bradycardia 08/05/2016   Hypertensive urgency 08/03/2016   Asthma 02/19/2016   Chronic kidney disease (CKD), stage III (moderate) (HCC) 07/05/2015   CAFL (chronic airflow limitation) ([place]) 07/05/2015   Generalized OA 07/05/2015   Detrusor muscle hypertonia 07/05/2015   H/O malignant neoplasm of colon 07/05/2015   Elevated troponin 07/02/2015   UTI (lower urinary tract infection) 07/01/2015   Fibromyalgia syndrome 07/01/2015   Hyponatremia 07/01/2015   Urinary tract infectious disease    Pulmonary nodules/lesions, multiple 04/11/2014   Abdominal pain, generalized 04/07/2014   Coronary atherosclerosis of native coronary artery 10/12/2013   Essential hypertension, benign    Other and unspecified angina pectoris    Atrial fibrillation ([place]) 04/19/2010   Allergic rhinitis 04/19/2010   DYSPNEA 04/19/2010   CHEST PAIN, ATYPICAL 04/19/2010   CANCER, COLORECTAL 04/18/2010   Essential hypertension 04/18/2010   G E R D 04/18/2010   Fibromyalgia 04/18/2010   INSOMNIA 04/18/2010    Past Surgical History:  Procedure Laterality Date   ABDOMINAL HYSTERECTOMY     ANKLE FRACTURE SURGERY     BACK SURGERY     BREAST SURGERY     COLON SURGERY  CORONARY STENT PLACEMENT     PERCUTANEOUS CORONARY STENT INTERVENTION (PCI-S) N/A 05/19/2013   Procedure: PERCUTANEOUS CORONARY STENT INTERVENTION (PCI-S);  Surgeon: Jettie Booze, MD;  Location: [facility] CATH LAB;  Service: Cardiovascular;  Laterality: N/A;     OB History   No obstetric history on file.     Family History  Problem Relation Age of Onset   CAD Brother    Heart attack Brother 33   Bone cancer Sister    Throat cancer Daughter     Social History   Tobacco Use   Smoking status: Former     Packs/day: 1.00    Years: 12.00    Pack years: 12.00    Types: Cigarettes    Start date: 10/28/1949    Quit date: 10/28/1962    Years since quitting: 58.8   Smokeless tobacco: Never  Vaping Use   Vaping Use: Never used  Substance Use Topics   Alcohol use: No   Drug use: No    Home Medications Prior to Admission medications   Medication Sig Start Date End Date Taking? Authorizing Provider  acetaminophen (TYLENOL) 650 MG CR tablet Take 650 mg by mouth every 8 (eight) hours as needed for pain.  Patient not taking: Reported on 07/10/2021    [provider]  albuterol (PROVENTIL) (2.5 MG/3ML) 0.083% nebulizer solution USE 1 VIAL VIA NEBULIZER EVERY 6 HOURS AS NEEDED 02/20/21   Rigoberto Noel, MD  Albuterol Sulfate (PROAIR RESPICLICK) 951 (90 Base) MCG/ACT AEPB Inhale 1-2 puffs into the lungs every 6 (six) hours as needed (for shortness of breath and/or wheezing.). 10/31/20   Rigoberto Noel, MD  ALPRAZolam Duanne Moron) 0.25 MG tablet Take 0.25 mg by mouth 3 (three) times daily as needed for anxiety.    [provider]  amitriptyline (ELAVIL) 10 MG tablet Take 1 tablet (10 mg total) by mouth at bedtime as needed for sleep. Patient not taking: No sig reported 09/11/20   Raulkar, Clide Deutscher, MD  Ascorbic Acid (VITAMIN C) 1000 MG tablet Take 1,000 mg by mouth daily.    [provider]  Budeson-Glycopyrrol-Formoterol (BREZTRI AEROSPHERE) 160-9-4.8 MCG/ACT AERO Inhale 2 puffs into the lungs in the morning and at bedtime. 09/18/20   Lauraine Rinne, NP  Budeson-Glycopyrrol-Formoterol (BREZTRI AEROSPHERE) 160-9-4.8 MCG/ACT AERO Inhale 2 puffs into the lungs in the morning and at bedtime. 10/31/20   Rigoberto Noel, MD  carbamide peroxide (DEBROX) 6.5 % OTIC solution Place 5 drops into both ears 2 (two) times daily. Patient not taking: No sig reported 04/05/20   Raulkar, Clide Deutscher, MD  Cholecalciferol (VITAMIN D) 50 MCG (2000 UT) tablet Take 2,000 Units by mouth daily.    [provider]  clopidogrel (PLAVIX) 75 MG tablet TAKE 1 TABLET(75 MG) BY MOUTH EVERY OTHER DAY 10/29/16   Jettie Booze, MD  diphenhydrAMINE (BENADRYL ALLERGY) 25 mg capsule Take 1 capsule (25 mg total) by mouth at bedtime as needed. Patient not taking: No sig reported 12/28/19   Raulkar, Clide Deutscher, MD  docusate sodium (COLACE) 100 MG capsule Take 1 capsule (100 mg total) by mouth in the morning, at noon, and at bedtime. Patient not taking: No sig reported 05/03/20   Raulkar, Clide Deutscher, MD  DULoxetine (CYMBALTA) 60 MG capsule Take 60 mg by mouth daily. 09/25/17   [provider]  furosemide (LASIX) 40 MG tablet TAKE 1 TABLET BY MOUTH  DAILY 06/22/21   Jettie Booze, MD  gabapentin (NEURONTIN) 300 MG capsule  Take 1 capsule (300 mg total) by mouth at bedtime. Patient not taking: No sig reported 04/19/20   Mcarthur Rossetti, MD  ipratropium (ATROVENT) 0.03 % nasal spray Place 2 sprays into both nostrils every 12 (twelve) hours. Patient not taking: No sig reported 10/06/18   Martyn Ehrich, NP  isosorbide mononitrate (IMDUR) 60 MG 24 hr tablet TAKE 1 TABLET BY MOUTH  DAILY 07/20/21   Jettie Booze, MD  levothyroxine (SYNTHROID) 100 MCG tablet Take by mouth daily before breakfast.    [provider]  lidocaine (LIDODERM) 5 % Place 1 patch onto the skin daily. Remove & Discard patch within 12 hours or as directed by MD Patient not taking: No sig reported 12/13/20   Raulkar, Clide Deutscher, MD  morphine (MS CONTIN) 15 MG 12 hr tablet Take 1 tablet (15 mg total) by mouth every 12 (twelve) hours. Patient not taking: No sig reported 11/27/20   Raulkar, Clide Deutscher, MD  Nebulizers (COMPRESSOR NEBULIZER) MISC 1 Device by Does not apply route once. 04/04/16   Deneise Lever, MD  nitroGLYCERIN (NITROSTAT) 0.4 MG SL tablet Place 1 tablet (0.4 mg total) under the tongue every 5 (five) minutes as needed for chest pain. 12/24/19   Jettie Booze, MD  Omega-3 Fatty Acids (FISH OIL) 1200 MG  CAPS Take 1,200 mg by mouth daily.    [provider]  oxyCODONE-acetaminophen (PERCOCET) 10-325 MG tablet Take 1 tablet by mouth 2 (two) times daily as needed for pain. Patient not taking: No sig reported 12/13/20   Raulkar, Clide Deutscher, MD  pentazocine-naloxone (TALWIN NX) 50-0.5 MG tablet Take 1 tablet by mouth every 4 (four) hours as needed.    [provider]  polyethylene glycol (MIRALAX / GLYCOLAX) 17 g packet Take 17 g by mouth daily as needed for mild constipation.     [provider]  potassium chloride SA (KLOR-CON) 20 MEQ tablet TAKE 1 TABLET BY MOUTH  DAILY 02/15/21   Jettie Booze, MD  pregabalin (LYRICA) 25 MG capsule Take 1 capsule (25 mg total) by mouth 2 (two) times daily. Patient not taking: No sig reported 12/13/20   Izora Ribas, MD  Respiratory Therapy Supplies (FLUTTER) DEVI Use flutter device 3 times a day 07/22/18   Martyn Ehrich, NP  rosuvastatin (CRESTOR) 20 MG tablet TAKE 1 TABLET BY MOUTH  DAILY Patient not taking: No sig reported 11/23/20   Jettie Booze, MD  senna (SENOKOT) 8.6 MG TABS tablet Take 2 tablets (17.2 mg total) by mouth at bedtime. Patient not taking: No sig reported 05/03/20   Raulkar, Clide Deutscher, MD  valsartan-hydrochlorothiazide (DIOVAN-HCT) 160-12.5 MG tablet Take 1 tablet by mouth daily.    [provider]    Allergies    Aspirin, Hydroxyquinolines, Imipramine hcl, Pamelor [nortriptyline hcl], Prednisone, Pregabalin, Procardia [nifedipine], Tramadol, Ace inhibitors, Hydroxyzine hcl, Influenza vaccines, Morphine and related, Oxycodone-acetaminophen, Gabapentin, and Plaquenil [hydroxychloroquine sulfate]  Review of Systems   Review of Systems  All other systems reviewed and are negative.  Physical Exam Updated Vital Signs BP (!) 160/136   Pulse (!) 156   Temp 97.8 F (36.6 C) (Oral)   Resp 17   Ht 5\' 4"  (1.626 m)   Wt 110 kg   SpO2 95%   BMI 41.63 kg/m   Physical Exam Vitals and  nursing note reviewed.  Constitutional:      General: She is not in acute distress.    Appearance: She is well-developed. She is  not diaphoretic.  HENT:     Head: Normocephalic and atraumatic.  Cardiovascular:     Rate and Rhythm: Tachycardia present. Rhythm irregular.     Heart sounds: No murmur heard.   No friction rub. No gallop.  Pulmonary:     Effort: Pulmonary effort is normal. No respiratory distress.     Breath sounds: Normal breath sounds. No wheezing.  Abdominal:     General: Bowel sounds are normal. There is no distension.     Palpations: Abdomen is soft.     Tenderness: There is no abdominal tenderness.  Musculoskeletal:        General: Normal range of motion.     Cervical back: Normal range of motion and neck supple.  Skin:    General: Skin is warm and dry.  Neurological:     General: No focal deficit present.     Mental Status: She is alert and oriented to person, place, and time.    ED Results / Procedures / Treatments   Labs (all labs ordered are listed, but only abnormal results are displayed) Labs Reviewed  CBC  PROTIME-INR  BASIC METABOLIC PANEL  TROPONIN I (HIGH SENSITIVITY)    EKG EKG Interpretation  Date/Time:  Saturday August 18 2021 04:33:30 EDT Ventricular Rate:  154 PR Interval:    QRS Duration: 72 QT Interval:  292 QTC Calculation: 467 R Axis:   79 Text Interpretation: Atrial fibrillation with rapid ventricular response Nonspecific ST abnormality Abnormal ECG When compared with ECG dated 01/31/2021, atrial fibrillation has replaced sinus rhythm Confirmed by Veryl Speak [phone]) on 08/18/2021 5:17:31 AM  Radiology No results found.  Procedures Procedures   Medications Ordered in ED Medications - No data to display  ED Course  I have reviewed the triage vital signs and the nursing notes.  Pertinent labs & imaging results that were available during my care of the patient were reviewed by me and considered in my medical decision  making (see chart for details).    MDM Rules/Calculators/A&P  Patient presenting with chest tightness and found to be in atrial fibrillation with rapid ventricular response.  Her A. fib spontaneously converted in the ER.  Patient's symptoms have improved.  Initial troponin was 16, then repeated at 33.  This will be discussed with cardiology and final disposition yet to be determined.  Care will be signed out to Dr. Tamera Punt at shift change.  She will speak with cardiology and assist in determining the final disposition.  Final Clinical Impression(s) / ED Diagnoses Final diagnoses:  None    Rx / DC Orders ED Discharge Orders     None        Veryl Speak, MD 08/19/21 [phone]

## 2021-08-18 NOTE — Consult Note (Signed)
Cardiology Consultation:   Patient ID: Sheryl Curtis MRN: [id number]; DOB: [birth year]  Admit date: 08/18/2021 Date of Consult: 08/18/2021  PCP:  Alroy Dust, L.Marlou Sa, MD   [facility] Providers Cardiologist:  Larae Grooms, MD      Patient Profile:   Sheryl Curtis is a 87 y.o. female with a hx of CAD s/p DES to LAD '14, hypertension, palpitations, HFpEF who is being seen 08/18/2021 for the evaluation of atrial fibrillation RVR at the request of Dr. Roosevelt Locks.  History of Present Illness:   Sheryl Curtis is a 87 year old female with past medical history noted above.  Underwent cardiac catheterization in 2014 with DES placed to LAD.  She was seen in the office in 2019 with some atypical chest pain which resolved with nitro and shortness of breath that was felt to be due to conditioning.  Echocardiogram at that time showed LVEF of 65 to 70% with grade 1 diastolic dysfunction.  Chest x-ray was normal.  She was then seen via virtual visit 02/2019 by Estella Husk with complaints of chest pain when she would lay down at night.  Would happen several times a week but also noted she was under significant stress.  Does have a history of OSA but did not tolerate CPAP in the past.  Recommendations were to increase her Imdur to 60 mg daily with follow-up in 1 month.  Also instructed to increase her Lasix to 80 mg for 2 days then back to 40 mg daily.  Seen by Dr. Irish Lack seen on 11/2019 and noted to be in her usual state of health.  No changes made at that visit.  She called into the office 12/2020 with complaints of chest pain.  Recommendations were for outpatient Myoview prior to office visit.  Myoview 01/22/2021 showed no evidence of ischemia or infarct, LVEF of greater than 65%, low risk study.  Presented to the ED on 10/22 with complaints of shortness of breath and chest tightness. Also noted palpitations. Symptoms worsened the morning of admission prompting her to be seen.   Labs on admission sodium  138, potassium 5, creatinine 1.3, BNP 45, high-sensitivity troponin 13>> 29, WBC 9.9, hemoglobin 14.6.  EKG showed atrial fibrillation, RVR, 152 bpm, nonspecific ST changes. She was IV dilt and converted to SR. No further episodes of chest pain since conversion.    Past Medical History:  Diagnosis Date   ALLERGIC RHINITIS    Atrial fibrillation (HCC)    CANCER, COLORECTAL    CHEST PAIN, ATYPICAL    CKD (chronic kidney disease)    DYSPNEA    Essential hypertension, benign    FIBROMYALGIA    G E R D    History of stress test    Myoview 8/16:  EF 69%, anterior and apical defect c/w breast attenuation; Low Risk    INSOMNIA    Morbid obesity ([place])    Other and unspecified angina pectoris    Sleep apnea     Past Surgical History:  Procedure Laterality Date   ABDOMINAL HYSTERECTOMY     ANKLE FRACTURE SURGERY     BACK SURGERY     BREAST SURGERY     COLON SURGERY     CORONARY STENT PLACEMENT     PERCUTANEOUS CORONARY STENT INTERVENTION (PCI-S) N/A 05/19/2013   Procedure: PERCUTANEOUS CORONARY STENT INTERVENTION (PCI-S);  Surgeon: Jettie Booze, MD;  Location: [facility] CATH LAB;  Service: Cardiovascular;  Laterality: N/A;     Home Medications:  Prior to Admission medications  Medication Sig Start Date End Date Taking? Authorizing Provider  albuterol (PROVENTIL) (2.5 MG/3ML) 0.083% nebulizer solution USE 1 VIAL VIA NEBULIZER EVERY 6 HOURS AS NEEDED Patient taking differently: Take 2.5 mg by nebulization every 6 (six) hours as needed for shortness of breath. 02/20/21  Yes Rigoberto Noel, MD  Albuterol Sulfate (PROAIR RESPICLICK) 025 (90 Base) MCG/ACT AEPB Inhale 1-2 puffs into the lungs every 6 (six) hours as needed (for shortness of breath and/or wheezing.). 10/31/20  Yes Rigoberto Noel, MD  ALPRAZolam Duanne Moron) 0.25 MG tablet Take 0.25 mg by mouth 3 (three) times daily as needed for anxiety.   Yes [provider]  Budeson-Glycopyrrol-Formoterol (BREZTRI AEROSPHERE) 160-9-4.8  MCG/ACT AERO Inhale 2 puffs into the lungs in the morning and at bedtime. 09/18/20  Yes Lauraine Rinne, NP  clopidogrel (PLAVIX) 75 MG tablet TAKE 1 TABLET(75 MG) BY MOUTH EVERY OTHER DAY Patient taking differently: Take 75 mg by mouth every other day. 10/29/16  Yes Jettie Booze, MD  DULoxetine (CYMBALTA) 60 MG capsule Take 60 mg by mouth daily. 09/25/17  Yes [provider]  furosemide (LASIX) 40 MG tablet TAKE 1 TABLET BY MOUTH  DAILY Patient taking differently: Take 20 mg by mouth daily. 06/22/21  Yes Jettie Booze, MD  isosorbide mononitrate (IMDUR) 60 MG 24 hr tablet TAKE 1 TABLET BY MOUTH  DAILY Patient taking differently: Take 60 mg by mouth daily. 07/20/21  Yes Jettie Booze, MD  Nebulizers (COMPRESSOR NEBULIZER) MISC 1 Device by Does not apply route once. 04/04/16  Yes Young, Tarri Fuller D, MD  nitroGLYCERIN (NITROSTAT) 0.4 MG SL tablet Place 1 tablet (0.4 mg total) under the tongue every 5 (five) minutes as needed for chest pain. 12/24/19  Yes Jettie Booze, MD  polyethylene glycol powder (GLYCOLAX/MIRALAX) 17 GM/SCOOP powder Take 17 g by mouth daily as needed for mild constipation.   Yes [provider]  potassium chloride SA (KLOR-CON) 20 MEQ tablet TAKE 1 TABLET BY MOUTH  DAILY Patient taking differently: Take 20 mEq by mouth daily. 02/15/21  Yes Jettie Booze, MD  psyllium (METAMUCIL) 58.6 % powder Take 1 packet by mouth daily.   Yes [provider]  Respiratory Therapy Supplies (FLUTTER) DEVI Use flutter device 3 times a day 07/22/18  Yes Martyn Ehrich, NP  valsartan (DIOVAN) 160 MG tablet Take 160 mg by mouth daily.   Yes [provider]  amitriptyline (ELAVIL) 10 MG tablet Take 1 tablet (10 mg total) by mouth at bedtime as needed for sleep. Patient not taking: No sig reported 09/11/20   Raulkar, Clide Deutscher, MD  carbamide peroxide (DEBROX) 6.5 % OTIC solution Place 5 drops into both ears 2 (two) times daily. Patient not  taking: No sig reported 04/05/20   Raulkar, Clide Deutscher, MD  diphenhydrAMINE (BENADRYL ALLERGY) 25 mg capsule Take 1 capsule (25 mg total) by mouth at bedtime as needed. Patient not taking: No sig reported 12/28/19   Raulkar, Clide Deutscher, MD  docusate sodium (COLACE) 100 MG capsule Take 1 capsule (100 mg total) by mouth in the morning, at noon, and at bedtime. Patient not taking: No sig reported 05/03/20   Raulkar, Clide Deutscher, MD  gabapentin (NEURONTIN) 300 MG capsule Take 1 capsule (300 mg total) by mouth at bedtime. Patient not taking: No sig reported 04/19/20   Mcarthur Rossetti, MD  ipratropium (ATROVENT) 0.03 % nasal spray Place 2 sprays into both nostrils every 12 (twelve) hours. Patient not taking: No sig reported  10/06/18   Martyn Ehrich, NP  lidocaine (LIDODERM) 5 % Place 1 patch onto the skin daily. Remove & Discard patch within 12 hours or as directed by MD Patient not taking: No sig reported 12/13/20   Raulkar, Clide Deutscher, MD  morphine (MS CONTIN) 15 MG 12 hr tablet Take 1 tablet (15 mg total) by mouth every 12 (twelve) hours. Patient not taking: No sig reported 11/27/20   Raulkar, Clide Deutscher, MD  oxyCODONE-acetaminophen (PERCOCET) 10-325 MG tablet Take 1 tablet by mouth 2 (two) times daily as needed for pain. Patient not taking: No sig reported 12/13/20   Raulkar, Clide Deutscher, MD  pregabalin (LYRICA) 25 MG capsule Take 1 capsule (25 mg total) by mouth 2 (two) times daily. Patient not taking: No sig reported 12/13/20   Raulkar, Clide Deutscher, MD  rosuvastatin (CRESTOR) 20 MG tablet TAKE 1 TABLET BY MOUTH  DAILY Patient not taking: No sig reported 11/23/20   Jettie Booze, MD  senna (SENOKOT) 8.6 MG TABS tablet Take 2 tablets (17.2 mg total) by mouth at bedtime. Patient not taking: No sig reported 05/03/20   Raulkar, Clide Deutscher, MD    Inpatient Medications: Scheduled Meds:  clopidogrel  75 mg Oral QODAY   diltiazem  10 mg Intravenous Once   DULoxetine  60 mg Oral Daily   fluticasone  furoate-vilanterol  1 puff Inhalation Daily   furosemide  20 mg Oral Daily   heparin  5,000 Units Subcutaneous Q12H   irbesartan  150 mg Oral Daily   [START ON 08/19/2021] isosorbide mononitrate  60 mg Oral Daily   psyllium  1 packet Oral Daily   umeclidinium bromide  1 puff Inhalation Daily   Continuous Infusions:  diltiazem (CARDIZEM) infusion Stopped (08/18/21 0600)   PRN Meds: albuterol, ALPRAZolam, metoprolol tartrate, polyethylene glycol powder  Allergies:    Allergies  Allergen Reactions   Aspirin Anaphylaxis, Hives and Shortness Of Breath   Hydroxyquinolines Hives   Imipramine Hcl Other (See Comments)    Unknown reaction Other reaction(s): weakness/groggy   Pamelor [Nortriptyline Hcl] Other (See Comments)    Unknown reaction   Prednisone Shortness Of Breath, Itching and Other (See Comments)    anxiety   Pregabalin Other (See Comments)    Gives her the shakes   Procardia [Nifedipine] Other (See Comments)    Unknown reaction   Tramadol Swelling   Ace Inhibitors Other (See Comments)    cough   Hydroxyzine Hcl Itching   Influenza Vaccines Other (See Comments)    High fever, couldn't get out of bed   Morphine And Related Other (See Comments)    Felt 'out of sorts'   Oxycodone-Acetaminophen     Other reaction(s): Brain fog   Gabapentin Anxiety   Plaquenil [Hydroxychloroquine Sulfate] Rash    Social History:   Social History   Socioeconomic History   Marital status: Widowed    Spouse name: Not on file   Number of children: 4   Years of education: Not on file   Highest education level: Not on file  Occupational History   Not on file  Tobacco Use   Smoking status: Former    Packs/day: 1.00    Years: 12.00    Pack years: 12.00    Types: Cigarettes    Start date: 10/28/1949    Quit date: 10/28/1962    Years since quitting: 58.8   Smokeless tobacco: Never  Vaping Use   Vaping Use: Never used  Substance and Sexual Activity  Alcohol use: No   Drug use: No    Sexual activity: Not on file  Other Topics Concern   Not on file  Social History Narrative   Not on file   Social Determinants of Health   Financial Resource Strain: Not on file  Food Insecurity: No Food Insecurity   Worried About Running Out of Food in the Last Year: Never true   Ran Out of Food in the Last Year: Never true  Transportation Needs: No Transportation Needs   Lack of Transportation (Medical): No   Lack of Transportation (Non-Medical): No  Physical Activity: Not on file  Stress: Not on file  Social Connections: Not on file  Intimate Partner Violence: Not on file    Family History:    Family History  Problem Relation Age of Onset   CAD Brother    Heart attack Brother 83   Bone cancer Sister    Throat cancer Daughter      ROS:  Please see the history of present illness.   All other ROS reviewed and negative.     Physical Exam/Data:   Vitals:   08/18/21 1130 08/18/21 1200 08/18/21 1230 08/18/21 1300  BP: 113/71 (!) 165/47 (!) 168/40 (!) 148/49  Pulse: 91 90 93 91  Resp: (!) 30 (!) 23 (!) 21 (!) 24  Temp:      TempSrc:      SpO2: 95% 95% 96% 93%  Weight:      Height:       No intake or output data in the 24 hours ending 08/18/21 1419 Last 3 Weights 08/18/2021 06/13/2021 06/05/2021  Weight (lbs) 242 lb 8.1 oz 220 lb 220 lb  Weight (kg) 110 kg 99.791 kg 99.791 kg     Body mass index is 41.63 kg/m.   Per MD  EKG:  The EKG was personally reviewed and demonstrates:  Afib RVR RVR, 152 bpm Telemetry:  Telemetry was personally reviewed and demonstrates:  Afib with conversion to SR  Relevant CV Studies:  Echo: pending  Laboratory Data:  High Sensitivity Troponin:   Recent Labs  Lab 08/18/21 0448 08/18/21 0637  TROPONINIHS 13 29*     Chemistry Recent Labs  Lab 08/18/21 0448  NA 138  K 5.0  CL 101  CO2 27  GLUCOSE 164*  BUN 15  CREATININE 1.38*  CALCIUM 9.4  GFRNONAA 37*  ANIONGAP 10    No results for input(s): PROT, ALBUMIN, AST,  ALT, ALKPHOS, BILITOT in the last 168 hours. Lipids No results for input(s): CHOL, TRIG, HDL, LABVLDL, LDLCALC, CHOLHDL in the last 168 hours.  Hematology Recent Labs  Lab 08/18/21 0448  WBC 9.9  RBC 4.60  HGB 14.6  HCT 45.4  MCV 98.7  MCH 31.7  MCHC 32.2  RDW 13.6  PLT 244   Thyroid No results for input(s): TSH, FREET4 in the last 168 hours.  BNP Recent Labs  Lab 08/18/21 0950  BNP 45.6    DDimer No results for input(s): DDIMER in the last 168 hours.   Radiology/Studies:  DG Chest 2 View  Result Date: 08/18/2021 CLINICAL DATA:  Chest pain EXAM: CHEST - 2 VIEW COMPARISON:  07/11/2021 FINDINGS: Stable cardiomediastinal contours. Aortic atherosclerotic calcifications. No signs of pleural effusion or edema. No airspace consolidation. Osseous structures appear intact. IMPRESSION: No active cardiopulmonary abnormalities. Electronically Signed   By: Kerby Moors M.D.   On: 08/18/2021 06:53     Assessment and Plan:   Sheryl Curtis is a 87  y.o. female with a hx of CAD s/p DES to LAD '14, hypertension, palpitations, HFpEF who is being seen 08/18/2021 for the evaluation of atrial fibrillation RVR at the request of Dr. Roosevelt Locks.  New onset atrial fibrillation with RVR: EKG on admission shows A. fib with rate of 152 bpm.  She was started on IV diltiazem briefly with conversion to sinus rhythm.  Currently maintaining sinus rhythm on telemetry.  -- start Dilt 120mg  daily -- CHA2DS2-VASc Score of at least 6, therefore would recommend initiation of Eliquis 5mg  BID -- echo pending  Chest discomfort with mildly elevated troponin: High-sensitivity troponin 13>> 29.  States her chest discomfort resolved with restoration of sinus rhythm.  Suspect secondary to rapid ventricular rates with her atrial fibrillation.  Recent Myoview 12/2020 with no ischemia. --Echo pending  CAD status post DES to LAD '14: As above low flat troponin trend not consistent with ACS. -- on plavix, statin, Imdur --  Continue medical management  Hypertension: Blood pressures are elevated in the ED. -- start Diltiazem 120mg  daily -- continue Avapro 150 mg daily, Imdur 60 mg  HFpEF: BNP 45, chest x-ray negative.  No signs of volume overload --Echo pending  For questions or updates, please contact [place] Please consult [email or web address] for contact info under    Signed, Reino Bellis, NP  08/18/2021 2:19 PM

## 2021-08-18 NOTE — ED Notes (Addendum)
Patient walked approximately 30 ft to BR,  was SOB with tachypnea following walk, 02 sats did return to baseline 98% on RA once back in bed

## 2021-08-18 NOTE — ED Notes (Signed)
Daughter at bedside.

## 2021-08-19 ENCOUNTER — Observation Stay (HOSPITAL_BASED_OUTPATIENT_CLINIC_OR_DEPARTMENT_OTHER): Payer: Medicare Other

## 2021-08-19 ENCOUNTER — Encounter (HOSPITAL_COMMUNITY): Payer: Self-pay | Admitting: Internal Medicine

## 2021-08-19 DIAGNOSIS — I4891 Unspecified atrial fibrillation: Secondary | ICD-10-CM

## 2021-08-19 LAB — CBC
HCT: 38.9 % (ref 36.0–46.0)
Hemoglobin: 13 g/dL (ref 12.0–15.0)
MCH: 31.8 pg (ref 26.0–34.0)
MCHC: 33.4 g/dL (ref 30.0–36.0)
MCV: 95.1 fL (ref 80.0–100.0)
Platelets: 208 10*3/uL (ref 150–400)
RBC: 4.09 MIL/uL (ref 3.87–5.11)
RDW: 13.5 % (ref 11.5–15.5)
WBC: 10.4 10*3/uL (ref 4.0–10.5)
nRBC: 0 % (ref 0.0–0.2)

## 2021-08-19 LAB — BASIC METABOLIC PANEL
Anion gap: 8 (ref 5–15)
BUN: 18 mg/dL (ref 8–23)
CO2: 26 mmol/L (ref 22–32)
Calcium: 8.8 mg/dL — ABNORMAL LOW (ref 8.9–10.3)
Chloride: 100 mmol/L (ref 98–111)
Creatinine, Ser: 1.17 mg/dL — ABNORMAL HIGH (ref 0.44–1.00)
GFR, Estimated: 45 mL/min — ABNORMAL LOW (ref >60)
Glucose, Bld: 134 mg/dL — ABNORMAL HIGH (ref 70–99)
Potassium: 3.8 mmol/L (ref 3.5–5.1)
Sodium: 134 mmol/L — ABNORMAL LOW (ref 135–145)

## 2021-08-19 LAB — ECHOCARDIOGRAM COMPLETE
Area-P 1/2: 2.72 cm2
Height: 64 in
S' Lateral: 3 cm
Weight: 3880.1 oz

## 2021-08-19 MED ORDER — ALUM & MAG HYDROXIDE-SIMETH 200-200-20 MG/5ML PO SUSP
30.0000 mL | ORAL | Status: DC | PRN
  Administered 2021-08-19: 30 mL via ORAL
  Filled 2021-08-19: qty 30

## 2021-08-19 MED ORDER — PERFLUTREN LIPID MICROSPHERE
1.0000 mL | INTRAVENOUS | Status: AC | PRN
  Administered 2021-08-19: 2 mL via INTRAVENOUS
  Filled 2021-08-19: qty 10

## 2021-08-19 MED ORDER — APIXABAN 5 MG PO TABS
5.0000 mg | ORAL_TABLET | Freq: Two times a day (BID) | ORAL | Status: DC
  Administered 2021-08-19 – 2021-08-21 (×5): 5 mg via ORAL
  Filled 2021-08-19 (×5): qty 1

## 2021-08-19 MED ORDER — ONDANSETRON HCL 4 MG/2ML IJ SOLN
4.0000 mg | Freq: Four times a day (QID) | INTRAMUSCULAR | Status: DC | PRN
  Administered 2021-08-19: 4 mg via INTRAVENOUS
  Filled 2021-08-19: qty 2

## 2021-08-19 NOTE — Evaluation (Signed)
Physical Therapy Evaluation Patient Details Name: Sheryl Curtis MRN: [id number] DOB: [birth year] Today's Date: 08/19/2021  History of Present Illness  Patient is a 87 y/o female who presents on 08/18/21 with chest pain and SOB. Found to be in A-fib with RVR. PMH includes HTN, A_fib, fibromyalgia, colon ca, back surgery, CAD with stenting, CKD, OSA.  Clinical Impression  Patient lives alone and is Mod I for ADLs and ambulation PTA but has her family check in on her daily.  She uses RW vs SPC for ambulation depending on how she is feeling that day. Today, pt tolerated bed mobility, transfers and gait training with Mod I-supervision for safety with use of RW for support. 2/4 DOE noted and Sp02 remained >96% on 2L/min 02 [place]. Pt reports feeling at baseline with regards to mobility and has assist at home as needed esp with IADLs, driving etc. No falls reported. Encouraged walking to bathroom as needed and hallway ambulation as well to decrease chances of deconditioning while in the hospital. Pt does not require skilled therapy services as pt functioning at baseline. Discharge from therapy.     Recommendations for follow up therapy are one component of a multi-disciplinary discharge planning process, led by the attending physician.  Recommendations may be updated based on patient status, additional functional criteria and insurance authorization.  Follow Up Recommendations No PT follow up;Supervision - Intermittent    Equipment Recommendations  None recommended by PT    Recommendations for Other Services       Precautions / Restrictions Precautions Precautions: None Restrictions Weight Bearing Restrictions: No      Mobility  Bed Mobility Overal bed mobility: Modified Independent             General bed mobility comments: HOB elevated, use of rails.    Transfers Overall transfer level: Modified independent Equipment used: Rolling walker (2 wheeled)             General  transfer comment: Stood from EOB without difficulty. No dizziness.  Ambulation/Gait Ambulation/Gait assistance: Modified independent (Device/Increase time) Gait Distance (Feet): 100 Feet Assistive device: Rolling walker (2 wheeled) Gait Pattern/deviations: Step-through pattern;Decreased stride length;Trunk flexed Gait velocity: decreased Gait velocity interpretation: <1.31 ft/sec, indicative of household ambulator General Gait Details: Slow, steady gait with RW for support; decreased foot clearance bilaterally. Sp02 remained 96% on 2L/min 02 [place]. 2/4 DOE.  Stairs            Wheelchair Mobility    Modified Rankin (Stroke Patients Only)       Balance Overall balance assessment: No apparent balance deficits (not formally assessed)                                           Pertinent Vitals/Pain Pain Assessment: No/denies pain    Home Living Family/patient expects to be discharged to:: Private residence Living Arrangements: Children Available Help at Discharge: Family;Available PRN/intermittently Type of Home: House Home Access: Level entry     Home Layout: One level Home Equipment: Walker - 2 wheels;Cane - single point;Bedside commode;Shower seat;Wheelchair - [employer] - 4 wheels Additional Comments: broken w/c    Prior Function Level of Independence: Independent with assistive device(s)         Comments: Uses RW vs SPC depending on how she feels; family helps with driving, cooking/cleaning. Gets into the shower and uses rhe seat. Uses 2L 02 at home  Hand Dominance   Dominant Hand: Right    Extremity/Trunk Assessment   Upper Extremity Assessment Upper Extremity Assessment: Defer to OT evaluation    Lower Extremity Assessment Lower Extremity Assessment: Overall WFL for tasks assessed    Cervical / Trunk Assessment Cervical / Trunk Assessment: Other exceptions Cervical / Trunk Exceptions: chrnic back pain  Communication    Communication: No difficulties  Cognition Arousal/Alertness: Awake/alert Behavior During Therapy: WFL for tasks assessed/performed Overall Cognitive Status: Within Functional Limits for tasks assessed                                        General Comments General comments (skin integrity, edema, etc.): VSS on 2L/min 02 [place]    Exercises     Assessment/Plan    PT Assessment Patent does not need any further PT services  PT Problem List         PT Treatment Interventions      PT Goals (Current goals can be found in the Care Plan section)  Acute Rehab PT Goals Patient Stated Goal: to go home PT Goal Formulation: All assessment and education complete, DC therapy    Frequency     Barriers to discharge        Co-evaluation               AM-PAC PT "6 Clicks" Mobility  Outcome Measure Help needed turning from your back to your side while in a flat bed without using bedrails?: A Little Help needed moving from lying on your back to sitting on the side of a flat bed without using bedrails?: A Little Help needed moving to and from a bed to a chair (including a wheelchair)?: None Help needed standing up from a chair using your arms (e.g., wheelchair or bedside chair)?: None Help needed to walk in hospital room?: A Little Help needed climbing 3-5 steps with a railing? : A Little 6 Click Score: 20    End of Session Equipment Utilized During Treatment: Oxygen Activity Tolerance: Patient tolerated treatment well Patient left: in bed;with call bell/phone within reach Nurse Communication: Mobility status PT Visit Diagnosis: Difficulty in walking, not elsewhere classified (R26.2);Muscle weakness (generalized) (M62.81);Unsteadiness on feet (R26.81)    Time: 1540-0867 PT Time Calculation (min) (ACUTE ONLY): 20 min   Charges:   PT Evaluation $PT Eval Moderate Complexity: 1 Mod          Marisa Severin, PT, DPT Acute Rehabilitation Services Pager  [phone] Office [phone]     Marguarite Arbour A Sabra Heck 08/19/2021, 9:35 AM

## 2021-08-19 NOTE — Progress Notes (Signed)
  Echocardiogram 2D Echocardiogram has been performed.  Sheryl Curtis 08/19/2021, 3:19 PM

## 2021-08-19 NOTE — Plan of Care (Signed)
  Problem: Activity: Goal: Ability to tolerate increased activity will improve Outcome: Progressing   Problem: Cardiac: Goal: Ability to achieve and maintain adequate cardiopulmonary perfusion will improve Outcome: Progressing   

## 2021-08-19 NOTE — Care Management Obs Status (Signed)
[place] NOTIFICATION   Patient Details  Name: Sheryl Curtis MRN: [id number] Date of Birth: 07-30-1932   Medicare Observation Status Notification Given:  Yes    Sharin Mons, RN 08/19/2021, 10:03 AM

## 2021-08-19 NOTE — Progress Notes (Signed)
For most of my shift, patient has been very sluggish. Alert and oriented and vital signs stable but she has constantly stated " I just don't feel good today." She has complained of feeling "drained and wiped out," she also has had a decreased appetite. I have gotten her out of bed to sit in the chair for her meals and once she eats, she has just stated she "doesn't have the energy." After eating a small portion of her lunch, she complained of being nauseated. I notified MD of above and she gave me an order for IV Zofran. The Zofran gave her relief of feeling queezy but she still stated she just doesn't feel well. Patients daughter updated of patient staying overnight.

## 2021-08-19 NOTE — Progress Notes (Signed)
PROGRESS NOTE    [name]  XIP:[id number] DOB: 08/12/1932 DOA: 08/18/2021 PCP: Alroy Dust, L.Marlou Sa, MD     Brief Narrative:  Sheryl Curtis is a 87 y.o. female with medical history significant of questionable history of A. fib, CAD with stenting on Plavix, HTN, hemorrhoid with intermittent rectal bleed, CKD stage II, morbid obesity, COPD, OSA, presented with palpitations and chest pains. Symptoms started few days ago, episodic, dull like pain between shoulder blades, episodic, resolved based on no sensation palpitations or chest pains no shortness of breath.  However, this morning, patient woke up with severe chest pain radiated to the bilateral shoulders, associated with palpitations, she took 2 nitro with partial relief.Marland Kitchen  EMS was called and found patient heart rate 150s, EKG showed A. Fib. Neither patient nor daughter can recall patient has A. fib diagnosed before.  The patient did admit that she had a stroke episode last year, not on Ziopatch before.   ED Course: A. fib, heart rate in 160s, patient was briefly on Cardizem drip and then heart rate converted back to sinus rhythm.  And chest pain also relieved.  Blood pressure stable, troponins 13> 29.  Cardiology consulted.  New events last 24 hours / Subjective: On my evaluation this morning, patient reported feeling well, denied any chest pain or heart palpitations.  Had some baseline shortness of breath, with her history of COPD but no worse than her baseline.  Was excited at the possibility of being discharged this afternoon.  Follow-up with RN this afternoon, patient had been very nauseous, feeling sick to her stomach today.  Not sure that she is ready for discharge yet  Assessment & Plan:   Active Problems:   New onset atrial fibrillation (HCC)   New onset A. Fib -CHA2DS2-VASc Score = 8  -Reviewed benefit versus risk of stroke prevention and bleeding with patient.  Patient agreed to start Eliquis and to monitor her bowel movements  for any blood loss.  She reports that she had external hemorrhoids and has had some intermittent bleeding but none recently.  Patient used to follow-up with Dr. Oletta Lamas as outpatient GI -Echocardiogram pending -Started on diltiazem -Appreciate cardiology  CAD -Continue Imdur, statin  Hypertension -Continue Avapro, Imdur, diltiazem  Chronic diastolic heart failure -Euvolemic at this time  COPD with chronic respiratory failure, hypoxemic -Wears 2 L nightly at baseline -Continue Breo, Incruse Ellipta, albuterol as needed  CKD stage IIIb -Stable  Anxiety -Continue Cymbalta, Xanax as needed   DVT prophylaxis:   apixaban (ELIQUIS) tablet 5 mg  Code Status: Full code  Family Communication: None at bedside Disposition Plan:  Status is: Observation  The patient will require care spanning > 2 midnights and should be moved to inpatient because: Echocardiogram pending.  Feeling poorly today, requiring IV Zofran     Consultants:  Cardiology  Procedures:  None  Antimicrobials:  Anti-infectives (From admission, onward)    None        Objective: Vitals:   08/18/21 2021 08/19/21 0045 08/19/21 0930 08/19/21 1437  BP: (!) 180/68 (!) 159/59 (!) 150/48 (!) 116/39  Pulse: 89 81 64 68  Resp: 20  20 18   Temp: 98.2 F (36.8 C)     TempSrc: Oral     SpO2: 100% 97% 98% 95%  Weight:      Height:       No intake or output data in the 24 hours ending 08/19/21 1516 Filed Weights   08/18/21 0437  Weight: 110 kg  Examination:  General exam: Appears calm and comfortable  Respiratory system: Clear to auscultation anteriorly. Respiratory effort normal. No respiratory distress. Mild conversational dyspnea.  Cardiovascular system: S1 & S2 heard, RRR. No murmurs. No pedal edema. Gastrointestinal system: Abdomen is nondistended, soft and nontender. Normal bowel sounds heard. Central nervous system: Alert and oriented. No focal neurological deficits. Speech clear.   Extremities: Symmetric in appearance  Skin: No rashes, lesions or ulcers on exposed skin  Psychiatry: Judgement and insight appear normal. Mood & affect appropriate.   Data Reviewed: I have personally reviewed following labs and imaging studies  CBC: Recent Labs  Lab 08/18/21 0448 08/19/21 0211  WBC 9.9 10.4  HGB 14.6 13.0  HCT 45.4 38.9  MCV 98.7 95.1  PLT 244 389   Basic Metabolic Panel: Recent Labs  Lab 08/18/21 0448 08/19/21 0211  NA 138 134*  K 5.0 3.8  CL 101 100  CO2 27 26  GLUCOSE 164* 134*  BUN 15 18  CREATININE 1.38* 1.17*  CALCIUM 9.4 8.8*   GFR: Estimated Creatinine Clearance: 41.1 mL/min (A) (by C-G formula based on SCr of 1.17 mg/dL (H)). Liver Function Tests: No results for input(s): AST, ALT, ALKPHOS, BILITOT, PROT, ALBUMIN in the last 168 hours. No results for input(s): LIPASE, AMYLASE in the last 168 hours. No results for input(s): AMMONIA in the last 168 hours. Coagulation Profile: Recent Labs  Lab 08/18/21 0448  INR 0.9   Cardiac Enzymes: No results for input(s): CKTOTAL, CKMB, CKMBINDEX, TROPONINI in the last 168 hours. BNP (last 3 results) Recent Labs    09/18/20 1130  PROBNP 271   HbA1C: No results for input(s): HGBA1C in the last 72 hours. CBG: No results for input(s): GLUCAP in the last 168 hours. Lipid Profile: No results for input(s): CHOL, HDL, LDLCALC, TRIG, CHOLHDL, LDLDIRECT in the last 72 hours. Thyroid Function Tests: No results for input(s): TSH, T4TOTAL, FREET4, T3FREE, THYROIDAB in the last 72 hours. Anemia Panel: No results for input(s): VITAMINB12, FOLATE, FERRITIN, TIBC, IRON, RETICCTPCT in the last 72 hours. Sepsis Labs: No results for input(s): PROCALCITON, LATICACIDVEN in the last 168 hours.  Recent Results (from the past 240 hour(s))  Resp Panel by RT-PCR (Flu A&B, Covid) Nasopharyngeal Swab     Status: None   Collection Time: 08/18/21 11:18 AM   Specimen: Nasopharyngeal Swab; Nasopharyngeal(NP) swabs in  vial transport medium  Result Value Ref Range Status   SARS Coronavirus 2 by RT PCR NEGATIVE NEGATIVE Final    Comment: (NOTE) SARS-CoV-2 target nucleic acids are NOT DETECTED.  The SARS-CoV-2 RNA is generally detectable in upper respiratory specimens during the acute phase of infection. The lowest concentration of SARS-CoV-2 viral copies this assay can detect is 138 copies/mL. A negative result does not preclude SARS-Cov-2 infection and should not be used as the sole basis for treatment or other patient management decisions. A negative result may occur with  improper specimen collection/handling, submission of specimen other than nasopharyngeal swab, presence of viral mutation(s) within the areas targeted by this assay, and inadequate number of viral copies(<138 copies/mL). A negative result must be combined with clinical observations, patient history, and epidemiological information. The expected result is Negative.  Fact Sheet for Patients:  EntrepreneurPulse.com.au  Fact Sheet for Healthcare Providers:  IncredibleEmployment.be  This test is no t yet approved or cleared by the [place] FDA and  has been authorized for detection and/or diagnosis of SARS-CoV-2 by FDA under an Emergency Use Authorization (EUA). This EUA will remain  in  effect (meaning this test can be used) for the duration of the COVID-19 declaration under Section 564(b)(1) of the Act, 21 U.S.C.section 360bbb-3(b)(1), unless the authorization is terminated  or revoked sooner.       Influenza A by PCR NEGATIVE NEGATIVE Final   Influenza B by PCR NEGATIVE NEGATIVE Final    Comment: (NOTE) The Xpert Xpress SARS-CoV-2/FLU/RSV plus assay is intended as an aid in the diagnosis of influenza from Nasopharyngeal swab specimens and should not be used as a sole basis for treatment. Nasal washings and aspirates are unacceptable for Xpert Xpress SARS-CoV-2/FLU/RSV testing.  Fact  Sheet for Patients: EntrepreneurPulse.com.au  Fact Sheet for Healthcare Providers: IncredibleEmployment.be  This test is not yet approved or cleared by the [place] FDA and has been authorized for detection and/or diagnosis of SARS-CoV-2 by FDA under an Emergency Use Authorization (EUA). This EUA will remain in effect (meaning this test can be used) for the duration of the COVID-19 declaration under Section 564(b)(1) of the Act, 21 U.S.C. section 360bbb-3(b)(1), unless the authorization is terminated or revoked.  Performed at [facility], [place] [address] [place], [place] [address]       Radiology Studies: DG Chest 2 View  Result Date: 08/18/2021 CLINICAL DATA:  Chest pain EXAM: CHEST - 2 VIEW COMPARISON:  07/11/2021 FINDINGS: Stable cardiomediastinal contours. Aortic atherosclerotic calcifications. No signs of pleural effusion or edema. No airspace consolidation. Osseous structures appear intact. IMPRESSION: No active cardiopulmonary abnormalities. Electronically Signed   By: Kerby Moors M.D.   On: 08/18/2021 06:53      Scheduled Meds:  apixaban  5 mg Oral BID   diltiazem  120 mg Oral Daily   DULoxetine  60 mg Oral Daily   fluticasone furoate-vilanterol  1 puff Inhalation Daily   furosemide  20 mg Oral Daily   irbesartan  150 mg Oral Daily   isosorbide mononitrate  60 mg Oral Daily   psyllium  1 packet Oral Daily   umeclidinium bromide  1 puff Inhalation Daily   Continuous Infusions:   LOS: 0 days      Time spent: 25 minutes   Dessa Phi, DO [facility] 08/19/2021, 3:16 PM   Available via Epic secure chat 7am-7pm After these hours, please refer to coverage provider listed on [email or web address]

## 2021-08-19 NOTE — Progress Notes (Signed)
Progress Note  Patient Name: Sheryl Curtis Date of Encounter: 08/19/2021  [facility] Cardiologist: Larae Grooms, MD   Subjective   NAEO. No AF overnight.  Inpatient Medications    Scheduled Meds:  clopidogrel  75 mg Oral QODAY   diltiazem  120 mg Oral Daily   DULoxetine  60 mg Oral Daily   fluticasone furoate-vilanterol  1 puff Inhalation Daily   furosemide  20 mg Oral Daily   heparin  5,000 Units Subcutaneous Q12H   irbesartan  150 mg Oral Daily   isosorbide mononitrate  60 mg Oral Daily   psyllium  1 packet Oral Daily   umeclidinium bromide  1 puff Inhalation Daily   Continuous Infusions:  PRN Meds: albuterol, ALPRAZolam, melatonin, metoprolol tartrate, polyethylene glycol powder   Vital Signs    Vitals:   08/18/21 1834 08/18/21 2021 08/19/21 0045 08/19/21 0930  BP: (!) 148/56 (!) 180/68 (!) 159/59 (!) 150/48  Pulse: 92 89 81 64  Resp: 20 20  20   Temp:  98.2 F (36.8 C)    TempSrc: Oral Oral    SpO2: 96% 100% 97% 98%  Weight:      Height:       No intake or output data in the 24 hours ending 08/19/21 0935 Last 3 Weights 08/18/2021 06/13/2021 06/05/2021  Weight (lbs) 242 lb 8.1 oz 220 lb 220 lb  Weight (kg) 110 kg 99.791 kg 99.791 kg      Telemetry    Personally Reviewed. No AF.  ECG    Personally Reviewed  Physical Exam   GEN: No acute distress.   Neck: No JVD Cardiac: RRR, no murmurs, rubs, or gallops.  Respiratory: Clear to auscultation bilaterally. GI: Soft, nontender, non-distended  MS: No edema; No deformity. Neuro:  Nonfocal  Psych: Normal affect   Labs    High Sensitivity Troponin:   Recent Labs  Lab 08/18/21 0448 08/18/21 0637  TROPONINIHS 13 29*     Chemistry Recent Labs  Lab 08/18/21 0448 08/19/21 0211  NA 138 134*  K 5.0 3.8  CL 101 100  CO2 27 26  GLUCOSE 164* 134*  BUN 15 18  CREATININE 1.38* 1.17*  CALCIUM 9.4 8.8*  GFRNONAA 37* 45*  ANIONGAP 10 8    Lipids No results for input(s): CHOL, TRIG, HDL,  LABVLDL, LDLCALC, CHOLHDL in the last 168 hours.  Hematology Recent Labs  Lab 08/18/21 0448 08/19/21 0211  WBC 9.9 10.4  RBC 4.60 4.09  HGB 14.6 13.0  HCT 45.4 38.9  MCV 98.7 95.1  MCH 31.7 31.8  MCHC 32.2 33.4  RDW 13.6 13.5  PLT 244 208   Thyroid No results for input(s): TSH, FREET4 in the last 168 hours.  BNP Recent Labs  Lab 08/18/21 0950  BNP 45.6    DDimer No results for input(s): DDIMER in the last 168 hours.   Radiology    DG Chest 2 View  Result Date: 08/18/2021 CLINICAL DATA:  Chest pain EXAM: CHEST - 2 VIEW COMPARISON:  07/11/2021 FINDINGS: Stable cardiomediastinal contours. Aortic atherosclerotic calcifications. No signs of pleural effusion or edema. No airspace consolidation. Osseous structures appear intact. IMPRESSION: No active cardiopulmonary abnormalities. Electronically Signed   By: Kerby Moors M.D.   On: 08/18/2021 06:53    Cardiac Studies   Echo pending.  Patient Profile     Ms. Nudelman is an 87 year old woman with coronary artery disease, hypertension, morbid obesity, chronic diastolic heart failure who I am seeing today for an evaluation  of atrial fibrillation at the request of Dr. Roosevelt Locks.  Assessment & Plan    #paroxysmal AF - cont diltiazem 120mg  PO daily - cont eliquis 5mg  PO BID - echo pending         Signed, Vickie Epley, MD  08/19/2021, 9:35 AM

## 2021-08-20 DIAGNOSIS — I4891 Unspecified atrial fibrillation: Secondary | ICD-10-CM | POA: Diagnosis not present

## 2021-08-20 LAB — BASIC METABOLIC PANEL
Anion gap: 11 (ref 5–15)
BUN: 25 mg/dL — ABNORMAL HIGH (ref 8–23)
CO2: 23 mmol/L (ref 22–32)
Calcium: 8.8 mg/dL — ABNORMAL LOW (ref 8.9–10.3)
Chloride: 97 mmol/L — ABNORMAL LOW (ref 98–111)
Creatinine, Ser: 1.59 mg/dL — ABNORMAL HIGH (ref 0.44–1.00)
GFR, Estimated: 31 mL/min — ABNORMAL LOW (ref >60)
Glucose, Bld: 112 mg/dL — ABNORMAL HIGH (ref 70–99)
Potassium: 4.5 mmol/L (ref 3.5–5.1)
Sodium: 131 mmol/L — ABNORMAL LOW (ref 135–145)

## 2021-08-20 LAB — CBC
HCT: 38.9 % (ref 36.0–46.0)
Hemoglobin: 12.6 g/dL (ref 12.0–15.0)
MCH: 31.6 pg (ref 26.0–34.0)
MCHC: 32.4 g/dL (ref 30.0–36.0)
MCV: 97.5 fL (ref 80.0–100.0)
Platelets: 216 10*3/uL (ref 150–400)
RBC: 3.99 MIL/uL (ref 3.87–5.11)
RDW: 13.6 % (ref 11.5–15.5)
WBC: 10.6 10*3/uL — ABNORMAL HIGH (ref 4.0–10.5)
nRBC: 0 % (ref 0.0–0.2)

## 2021-08-20 MED ORDER — SODIUM CHLORIDE 0.9 % IV SOLN: INTRAVENOUS | Status: DC

## 2021-08-20 NOTE — Progress Notes (Signed)
Progress Note  Patient Name: Sheryl Curtis Date of Encounter: 08/20/2021  [facility] Cardiologist: Larae Grooms, MD   Subjective   Denies any CP or SOB.   Inpatient Medications    Scheduled Meds:  apixaban  5 mg Oral BID   diltiazem  120 mg Oral Daily   DULoxetine  60 mg Oral Daily   fluticasone furoate-vilanterol  1 puff Inhalation Daily   isosorbide mononitrate  60 mg Oral Daily   psyllium  1 packet Oral Daily   umeclidinium bromide  1 puff Inhalation Daily   Continuous Infusions:  sodium chloride 100 mL/hr at 08/20/21 0840   PRN Meds: albuterol, ALPRAZolam, alum & mag hydroxide-simeth, melatonin, metoprolol tartrate, ondansetron (ZOFRAN) IV, polyethylene glycol powder   Vital Signs    Vitals:   08/19/21 0045 08/19/21 0930 08/19/21 1437 08/19/21 2013  BP: (!) 159/59 (!) 150/48 (!) 116/39 (!) 120/53  Pulse: 81 64 68 71  Resp:  20 18 18   Temp:    98.5 F (36.9 C)  TempSrc:    Oral  SpO2: 97% 98% 95% 95%  Weight:      Height:        Intake/Output Summary (Last 24 hours) at 08/20/2021 0945 Last data filed at 08/20/2021 0300 Gross per 24 hour  Intake 0.15 ml  Output --  Net 0.15 ml   Last 3 Weights 08/18/2021 06/13/2021 06/05/2021  Weight (lbs) 242 lb 8.1 oz 220 lb 220 lb  Weight (kg) 110 kg 99.791 kg 99.791 kg      Telemetry    NSR with HR 50-60s overnight, no recurrent of afib - Personally Reviewed  ECG    NSR without significant ST-T wave changes - Personally Reviewed  Physical Exam   GEN: No acute distress.   Neck: No JVD Cardiac: RRR, no murmurs, rubs, or gallops.  Respiratory: Clear to auscultation bilaterally. GI: Soft, nontender, non-distended  MS: No edema; No deformity. Neuro:  Nonfocal  Psych: Normal affect   Labs    High Sensitivity Troponin:   Recent Labs  Lab 08/18/21 0448 08/18/21 0637  TROPONINIHS 13 29*     Chemistry Recent Labs  Lab 08/18/21 0448 08/19/21 0211 08/20/21 0151  NA 138 134* 131*  K 5.0 3.8  4.5  CL 101 100 97*  CO2 27 26 23   GLUCOSE 164* 134* 112*  BUN 15 18 25*  CREATININE 1.38* 1.17* 1.59*  CALCIUM 9.4 8.8* 8.8*  GFRNONAA 37* 45* 31*  ANIONGAP 10 8 11     Lipids No results for input(s): CHOL, TRIG, HDL, LABVLDL, LDLCALC, CHOLHDL in the last 168 hours.  Hematology Recent Labs  Lab 08/18/21 0448 08/19/21 0211 08/20/21 0151  WBC 9.9 10.4 10.6*  RBC 4.60 4.09 3.99  HGB 14.6 13.0 12.6  HCT 45.4 38.9 38.9  MCV 98.7 95.1 97.5  MCH 31.7 31.8 31.6  MCHC 32.2 33.4 32.4  RDW 13.6 13.5 13.6  PLT 244 208 216   Thyroid No results for input(s): TSH, FREET4 in the last 168 hours.  BNP Recent Labs  Lab 08/18/21 0950  BNP 45.6    DDimer No results for input(s): DDIMER in the last 168 hours.   Radiology    ECHOCARDIOGRAM COMPLETE  Result Date: 08/19/2021    ECHOCARDIOGRAM REPORT   Patient Name:   Sheryl Curtis Date of Exam: 08/19/2021 Medical Rec #:  [id number]     Height:       64.0 in Accession #:    [id number]  Weight:       242.5 lb Date of Birth:  01-26-1932     BSA:          2.123 m Patient Age:    87 years      BP:           116/39 mmHg Patient Gender: F             HR:           68 bpm. Exam Location:  Inpatient Procedure: 2D Echo, Cardiac Doppler, Color Doppler and Intracardiac            Opacification Agent Indications:    Atrial fibrillation  History:        Patient has prior history of Echocardiogram examinations, most                 recent 07/08/2018. CAD, COPD, Arrythmias:Atrial Fibrillation and                 palpitations, Signs/Symptoms:Chest Pain; Risk Factors:Morbid                 obesity, Sleep Apnea and Hypertension. CKD.  Sonographer:    Dustin Flock RDCS Referring Phys: [id number] [facility]  Sonographer Comments: Technically difficult study due to poor echo windows and patient is morbidly obese. IMPRESSIONS  1. Left ventricular ejection fraction, by estimation, is 65 to 70%. The left ventricle has normal function. The left ventricle has no regional  wall motion abnormalities. There is moderate concentric left ventricular hypertrophy. Left ventricular diastolic parameters are indeterminate.  2. Right ventricular systolic function is normal. The right ventricular size is not well visualized.  3. The mitral valve was not well visualized. Trivial mitral valve regurgitation. No evidence of mitral stenosis.  4. The aortic valve was not well visualized. Aortic valve regurgitation is not visualized. No aortic stenosis is present. Comparison(s): No significant change from prior study. Conclusion(s)/Recommendation(s): Technically challenging images; valves not well seen but no significant valve disease by Doppler. Echo contrast shows normal to hyperdynamic LV function without focal wall motion abnormalities. FINDINGS  Left Ventricle: Left ventricular ejection fraction, by estimation, is 65 to 70%. The left ventricle has normal function. The left ventricle has no regional wall motion abnormalities. Definity contrast agent was given IV to delineate the left ventricular  endocardial borders. The left ventricular internal cavity size was normal in size. There is moderate concentric left ventricular hypertrophy. Left ventricular diastolic parameters are indeterminate. Right Ventricle: The right ventricular size is not well visualized. Right vetricular wall thickness was not well visualized. Right ventricular systolic function is normal. Left Atrium: Left atrial size was not well visualized. Right Atrium: Right atrial size was not well visualized. Pericardium: There is no evidence of pericardial effusion. Presence of pericardial fat pad. Mitral Valve: The mitral valve was not well visualized. There is mild thickening of the mitral valve leaflet(s). There is mild calcification of the mitral valve leaflet(s). Mild to moderate mitral annular calcification. Trivial mitral valve regurgitation. No evidence of mitral valve stenosis. Tricuspid Valve: The tricuspid valve is not well  visualized. Tricuspid valve regurgitation is trivial. No evidence of tricuspid stenosis. Aortic Valve: The aortic valve was not well visualized. Aortic valve regurgitation is not visualized. No aortic stenosis is present. Pulmonic Valve: The pulmonic valve was not well visualized. Pulmonic valve regurgitation is not visualized. Aorta: The aortic root was not well visualized, the ascending aorta was not well visualized and the aortic arch was not well visualized.  Venous: The inferior vena cava was not well visualized. IAS/Shunts: The interatrial septum was not well visualized.  LEFT VENTRICLE PLAX 2D LVIDd:         4.30 cm   Diastology LVIDs:         3.00 cm   LV e' medial:    5.11 cm/s LV PW:         1.30 cm   LV E/e' medial:  20.2 LV IVS:        1.40 cm   LV e' lateral:   5.11 cm/s LVOT diam:     1.90 cm   LV E/e' lateral: 20.2 LV SV:         53 LV SV Index:   25 LVOT Area:     2.84 cm  RIGHT VENTRICLE RV Basal diam:  2.50 cm RV S prime:     9.03 cm/s TAPSE (M-mode): 1.8 cm LEFT ATRIUM           Index        RIGHT ATRIUM           Index LA diam:      4.40 cm 2.07 cm/m   RA Area:     11.10 cm LA Vol (A4C): 50.4 ml 23.74 ml/m  RA Volume:   25.30 ml  11.92 ml/m  AORTIC VALVE LVOT Vmax:   81.60 cm/s LVOT Vmean:  51.900 cm/s LVOT VTI:    0.188 m  AORTA Ao Root diam: 3.10 cm MITRAL VALVE MV Area (PHT): 2.72 cm     SHUNTS MV Decel Time: 279 msec     Systemic VTI:  0.19 m MV E velocity: 103.00 cm/s  Systemic Diam: 1.90 cm Buford Dresser MD Electronically signed by Buford Dresser MD Signature Date/Time: 08/19/2021/5:10:16 PM    Final     Cardiac Studies   Echo 08/19/2021  1. Left ventricular ejection fraction, by estimation, is 65 to 70%. The  left ventricle has normal function. The left ventricle has no regional  wall motion abnormalities. There is moderate concentric left ventricular  hypertrophy. Left ventricular  diastolic parameters are indeterminate.   2. Right ventricular systolic  function is normal. The right ventricular  size is not well visualized.   3. The mitral valve was not well visualized. Trivial mitral valve  regurgitation. No evidence of mitral stenosis.   4. The aortic valve was not well visualized. Aortic valve regurgitation  is not visualized. No aortic stenosis is present.   Comparison(s): No significant change from prior study.   Conclusion(s)/Recommendation(s): Technically challenging images; valves  not well seen but no significant valve disease by Doppler. Echo contrast  shows normal to hyperdynamic LV function without focal wall motion  abnormalities.   Patient Profile     87 y.o. female with PMH of CAD, HTN, morbid obesity, and chronic diastolic CHF presented with CP and SOB and found to have afib with RVR. Started on IV diltiazem which converted her to sinus rhythm.   Assessment & Plan    New onset atrial fibrillation  - Echo 08/19/2021 EF 65-70%, no RWMA, trivial MR.   - started on Eliquis during this admission. Continue diltiazem.  - Feeling good, likely can be discharged today. See instruction below regarding AKI.   Chest pain: mildly elevated troponin 13 --> 29. Chest pain resolved with conversion from afib back to sinus rhythm, likely related to afib. Myoview on 01/22/2021 was normal. No further work up unless symptom recurs.   CAD: plavix stopped in  anticipation of adding Eliquis  HTN  Chronic diastolic heart failure: euvolemic.   AKI: Cr went up to 1.59 this morning. lasix and ARB held. Likely result of overdiuresis. Talking with the patient, she is actually taking the lasix every other day at home with more fluid intake she does here. Lasix holiday today. Ok to restart home dose of lasix (every other day) and ARB tomorrow. Repeat BMET in 3-5 days.       For questions or updates, please contact [place] Please consult [email or web address] for contact info under        Signed, Almyra Deforest, [place]  08/20/2021, 9:45 AM

## 2021-08-20 NOTE — Progress Notes (Signed)
PROGRESS NOTE    GAYANNE PRESCOTT  SJG:[id number] DOB: 1932-06-09 DOA: 08/18/2021 PCP: Alroy Dust, L.Marlou Sa, MD     Brief Narrative:  CHYANE GREER is a 87 y.o. female with medical history significant of questionable history of A. fib, CAD with stenting on Plavix, HTN, hemorrhoid with intermittent rectal bleed, CKD stage II, morbid obesity, COPD, OSA, presented with palpitations and chest pains. Symptoms started few days ago, episodic, dull like pain between shoulder blades, episodic, resolved based on no sensation palpitations or chest pains no shortness of breath.  However, this morning, patient woke up with severe chest pain radiated to the bilateral shoulders, associated with palpitations, she took 2 nitro with partial relief.Marland Kitchen  EMS was called and found patient heart rate 150s, EKG showed A. Fib. Neither patient nor daughter can recall patient has A. fib diagnosed before.  The patient did admit that she had a stroke episode last year, not on Ziopatch before.   ED Course: A. fib, heart rate in 160s, patient was briefly on Cardizem drip and then heart rate converted back to sinus rhythm.  And chest pain also relieved.  Blood pressure stable, troponins 13> 29.  Cardiology consulted.  New events last 24 hours / Subjective: Had continued episodes of nausea yesterday.  Was finally able to eat dinner last night but this morning feeling poorly again.  Has not eaten breakfast.  Creatinine bumped up to 1.59 overnight.  Assessment & Plan:   Active Problems:   New onset atrial fibrillation (HCC)   New onset A. Fib -CHA2DS2-VASc Score = 8  -Reviewed benefit versus risk of stroke prevention and bleeding with patient.  Patient agreed to start Eliquis and to monitor her bowel movements for any blood loss.  She reports that she had external hemorrhoids and has had some intermittent bleeding but none recently.  Patient used to follow-up with Dr. Oletta Lamas as outpatient GI -Echocardiogram EF 65-70%  -Started on  diltiazem -Appreciate cardiology; follow up with Dr. Irish Lack   AKI on CKD stage IIIb -Baseline creatinine 1.1, trended upward to 1.59.  Hold Lasix and Avapro.  Give gentle IV fluids today  CAD -Continue Imdur, statin  Hypertension -Continue Imdur, diltiazem -Hold Avapro due to elevated creatinine  Chronic diastolic heart failure -Euvolemic at this time -Hold Lasix due to elevation in creatinine (takes every other day as outpatient)   COPD with chronic respiratory failure, hypoxemic -Wears 2 L nightly at baseline -Continue Breo, Incruse Ellipta, albuterol as needed  Anxiety -Continue Cymbalta, Xanax as needed   DVT prophylaxis:   apixaban (ELIQUIS) tablet 5 mg  Code Status: Full code  Family Communication: None at bedside Disposition Plan:  Status is: Observation  The patient will require care spanning > 2 midnights and should be moved to inpatient because: Started IVF due to AKI and nausea      Consultants:  Cardiology  Procedures:  None  Antimicrobials:  Anti-infectives (From admission, onward)    None        Objective: Vitals:   08/19/21 0045 08/19/21 0930 08/19/21 1437 08/19/21 2013  BP: (!) 159/59 (!) 150/48 (!) 116/39 (!) 120/53  Pulse: 81 64 68 71  Resp:  20 18 18   Temp:    98.5 F (36.9 C)  TempSrc:    Oral  SpO2: 97% 98% 95% 95%  Weight:      Height:        Intake/Output Summary (Last 24 hours) at 08/20/2021 1338 Last data filed at 08/20/2021 1324 Gross per 24  hour  Intake 0.15 ml  Output 1500 ml  Net -1499.85 ml   Filed Weights   08/18/21 0437  Weight: 110 kg    Examination: General exam: Appears calm and comfortable  Respiratory system: Clear to auscultation. Respiratory effort normal. Cardiovascular system: S1 & S2 heard, RRR. No pedal edema. Gastrointestinal system: Abdomen is nondistended, soft and nontender. Normal bowel sounds heard. Central nervous system: Alert and oriented. Non focal exam. Speech clear   Extremities: Symmetric in appearance bilaterally  Skin: No rashes, lesions or ulcers on exposed skin  Psychiatry: Judgement and insight appear stable. Mood & affect appropriate.    Data Reviewed: I have personally reviewed following labs and imaging studies  CBC: Recent Labs  Lab 08/18/21 0448 08/19/21 0211 08/20/21 0151  WBC 9.9 10.4 10.6*  HGB 14.6 13.0 12.6  HCT 45.4 38.9 38.9  MCV 98.7 95.1 97.5  PLT 244 208 956    Basic Metabolic Panel: Recent Labs  Lab 08/18/21 0448 08/19/21 0211 08/20/21 0151  NA 138 134* 131*  K 5.0 3.8 4.5  CL 101 100 97*  CO2 27 26 23   GLUCOSE 164* 134* 112*  BUN 15 18 25*  CREATININE 1.38* 1.17* 1.59*  CALCIUM 9.4 8.8* 8.8*    GFR: Estimated Creatinine Clearance: 30.2 mL/min (A) (by C-G formula based on SCr of 1.59 mg/dL (H)). Liver Function Tests: No results for input(s): AST, ALT, ALKPHOS, BILITOT, PROT, ALBUMIN in the last 168 hours. No results for input(s): LIPASE, AMYLASE in the last 168 hours. No results for input(s): AMMONIA in the last 168 hours. Coagulation Profile: Recent Labs  Lab 08/18/21 0448  INR 0.9    Cardiac Enzymes: No results for input(s): CKTOTAL, CKMB, CKMBINDEX, TROPONINI in the last 168 hours. BNP (last 3 results) Recent Labs    09/18/20 1130  PROBNP 271    HbA1C: No results for input(s): HGBA1C in the last 72 hours. CBG: No results for input(s): GLUCAP in the last 168 hours. Lipid Profile: No results for input(s): CHOL, HDL, LDLCALC, TRIG, CHOLHDL, LDLDIRECT in the last 72 hours. Thyroid Function Tests: No results for input(s): TSH, T4TOTAL, FREET4, T3FREE, THYROIDAB in the last 72 hours. Anemia Panel: No results for input(s): VITAMINB12, FOLATE, FERRITIN, TIBC, IRON, RETICCTPCT in the last 72 hours. Sepsis Labs: No results for input(s): PROCALCITON, LATICACIDVEN in the last 168 hours.  Recent Results (from the past 240 hour(s))  Resp Panel by RT-PCR (Flu A&B, Covid) Nasopharyngeal Swab      Status: None   Collection Time: 08/18/21 11:18 AM   Specimen: Nasopharyngeal Swab; Nasopharyngeal(NP) swabs in vial transport medium  Result Value Ref Range Status   SARS Coronavirus 2 by RT PCR NEGATIVE NEGATIVE Final    Comment: (NOTE) SARS-CoV-2 target nucleic acids are NOT DETECTED.  The SARS-CoV-2 RNA is generally detectable in upper respiratory specimens during the acute phase of infection. The lowest concentration of SARS-CoV-2 viral copies this assay can detect is 138 copies/mL. A negative result does not preclude SARS-Cov-2 infection and should not be used as the sole basis for treatment or other patient management decisions. A negative result may occur with  improper specimen collection/handling, submission of specimen other than nasopharyngeal swab, presence of viral mutation(s) within the areas targeted by this assay, and inadequate number of viral copies(<138 copies/mL). A negative result must be combined with clinical observations, patient history, and epidemiological information. The expected result is Negative.  Fact Sheet for Patients:  EntrepreneurPulse.com.au  Fact Sheet for Healthcare Providers:  IncredibleEmployment.be  This test is no t yet approved or cleared by the [place] and  has been authorized for detection and/or diagnosis of SARS-CoV-2 by FDA under an Emergency Use Authorization (EUA). This EUA will remain  in effect (meaning this test can be used) for the duration of the COVID-19 declaration under Section 564(b)(1) of the Act, 21 U.S.C.section 360bbb-3(b)(1), unless the authorization is terminated  or revoked sooner.       Influenza A by PCR NEGATIVE NEGATIVE Final   Influenza B by PCR NEGATIVE NEGATIVE Final    Comment: (NOTE) The Xpert Xpress SARS-CoV-2/FLU/RSV plus assay is intended as an aid in the diagnosis of influenza from Nasopharyngeal swab specimens and should not be used as a sole basis  for treatment. Nasal washings and aspirates are unacceptable for Xpert Xpress SARS-CoV-2/FLU/RSV testing.  Fact Sheet for Patients: EntrepreneurPulse.com.au  Fact Sheet for Healthcare Providers: IncredibleEmployment.be  This test is not yet approved or cleared by the [place] FDA and has been authorized for detection and/or diagnosis of SARS-CoV-2 by FDA under an Emergency Use Authorization (EUA). This EUA will remain in effect (meaning this test can be used) for the duration of the COVID-19 declaration under Section 564(b)(1) of the Act, 21 U.S.C. section 360bbb-3(b)(1), unless the authorization is terminated or revoked.  Performed at [facility], [place] [address], [place], [place] [address]        Radiology Studies: ECHOCARDIOGRAM COMPLETE  Result Date: 08/19/2021    ECHOCARDIOGRAM REPORT   Patient Name:   DALYAH PLA Date of Exam: 08/19/2021 Medical Rec #:  [id number]     Height:       64.0 in Accession #:    [id number]    Weight:       242.5 lb Date of Birth:  October 07, 1932     BSA:          2.123 m Patient Age:    16 years      BP:           116/39 mmHg Patient Gender: F             HR:           68 bpm. Exam Location:  Inpatient Procedure: 2D Echo, Cardiac Doppler, Color Doppler and Intracardiac            Opacification Agent Indications:    Atrial fibrillation  History:        Patient has prior history of Echocardiogram examinations, most                 recent 07/08/2018. CAD, COPD, Arrythmias:Atrial Fibrillation and                 palpitations, Signs/Symptoms:Chest Pain; Risk Factors:Morbid                 obesity, Sleep Apnea and Hypertension. CKD.  Sonographer:    Dustin Flock RDCS Referring Phys: [id number] [facility]  Sonographer Comments: Technically difficult study due to poor echo windows and patient is morbidly obese. IMPRESSIONS  1. Left ventricular ejection fraction, by estimation, is 65 to 70%. The left ventricle has  normal function. The left ventricle has no regional wall motion abnormalities. There is moderate concentric left ventricular hypertrophy. Left ventricular diastolic parameters are indeterminate.  2. Right ventricular systolic function is normal. The right ventricular size is not well visualized.  3. The mitral valve was not well visualized. Trivial mitral valve regurgitation. No evidence of mitral stenosis.  4. The aortic valve was not well visualized. Aortic valve regurgitation is not visualized. No aortic stenosis is present. Comparison(s): No significant change from prior study. Conclusion(s)/Recommendation(s): Technically challenging images; valves not well seen but no significant valve disease by Doppler. Echo contrast shows normal to hyperdynamic LV function without focal wall motion abnormalities. FINDINGS  Left Ventricle: Left ventricular ejection fraction, by estimation, is 65 to 70%. The left ventricle has normal function. The left ventricle has no regional wall motion abnormalities. Definity contrast agent was given IV to delineate the left ventricular  endocardial borders. The left ventricular internal cavity size was normal in size. There is moderate concentric left ventricular hypertrophy. Left ventricular diastolic parameters are indeterminate. Right Ventricle: The right ventricular size is not well visualized. Right vetricular wall thickness was not well visualized. Right ventricular systolic function is normal. Left Atrium: Left atrial size was not well visualized. Right Atrium: Right atrial size was not well visualized. Pericardium: There is no evidence of pericardial effusion. Presence of pericardial fat pad. Mitral Valve: The mitral valve was not well visualized. There is mild thickening of the mitral valve leaflet(s). There is mild calcification of the mitral valve leaflet(s). Mild to moderate mitral annular calcification. Trivial mitral valve regurgitation. No evidence of mitral valve stenosis.  Tricuspid Valve: The tricuspid valve is not well visualized. Tricuspid valve regurgitation is trivial. No evidence of tricuspid stenosis. Aortic Valve: The aortic valve was not well visualized. Aortic valve regurgitation is not visualized. No aortic stenosis is present. Pulmonic Valve: The pulmonic valve was not well visualized. Pulmonic valve regurgitation is not visualized. Aorta: The aortic root was not well visualized, the ascending aorta was not well visualized and the aortic arch was not well visualized. Venous: The inferior vena cava was not well visualized. IAS/Shunts: The interatrial septum was not well visualized.  LEFT VENTRICLE PLAX 2D LVIDd:         4.30 cm   Diastology LVIDs:         3.00 cm   LV e' medial:    5.11 cm/s LV PW:         1.30 cm   LV E/e' medial:  20.2 LV IVS:        1.40 cm   LV e' lateral:   5.11 cm/s LVOT diam:     1.90 cm   LV E/e' lateral: 20.2 LV SV:         53 LV SV Index:   25 LVOT Area:     2.84 cm  RIGHT VENTRICLE RV Basal diam:  2.50 cm RV S prime:     9.03 cm/s TAPSE (M-mode): 1.8 cm LEFT ATRIUM           Index        RIGHT ATRIUM           Index LA diam:      4.40 cm 2.07 cm/m   RA Area:     11.10 cm LA Vol (A4C): 50.4 ml 23.74 ml/m  RA Volume:   25.30 ml  11.92 ml/m  AORTIC VALVE LVOT Vmax:   81.60 cm/s LVOT Vmean:  51.900 cm/s LVOT VTI:    0.188 m  AORTA Ao Root diam: 3.10 cm MITRAL VALVE MV Area (PHT): 2.72 cm     SHUNTS MV Decel Time: 279 msec     Systemic VTI:  0.19 m MV E velocity: 103.00 cm/s  Systemic Diam: 1.90 cm Buford Dresser MD Electronically signed by Buford Dresser MD Signature Date/Time: 08/19/2021/5:10:16  PM    Final       Scheduled Meds:  apixaban  5 mg Oral BID   diltiazem  120 mg Oral Daily   DULoxetine  60 mg Oral Daily   fluticasone furoate-vilanterol  1 puff Inhalation Daily   isosorbide mononitrate  60 mg Oral Daily   psyllium  1 packet Oral Daily   umeclidinium bromide  1 puff Inhalation Daily   Continuous Infusions:   sodium chloride 100 mL/hr at 08/20/21 0840     LOS: 0 days      Time spent: 25 minutes   Dessa Phi, DO [facility] 08/20/2021, 1:38 PM   Available via Epic secure chat 7am-7pm After these hours, please refer to coverage provider listed on [email or web address]

## 2021-08-20 NOTE — Progress Notes (Deleted)
Central telemetry to call and inform this RN that pt had brief run of SVT before returning to low sinus/sinus tach. Pt aand o x 4. Breathing unlabored. MD notified and informed that subsequent HR was 97. No new orders at this time. Will continue to monitor.

## 2021-08-21 ENCOUNTER — Other Ambulatory Visit (HOSPITAL_COMMUNITY): Payer: Self-pay

## 2021-08-21 DIAGNOSIS — I4891 Unspecified atrial fibrillation: Secondary | ICD-10-CM | POA: Diagnosis not present

## 2021-08-21 LAB — BASIC METABOLIC PANEL
Anion gap: 5 (ref 5–15)
BUN: 21 mg/dL (ref 8–23)
CO2: 29 mmol/L (ref 22–32)
Calcium: 8.9 mg/dL (ref 8.9–10.3)
Chloride: 100 mmol/L (ref 98–111)
Creatinine, Ser: 1.31 mg/dL — ABNORMAL HIGH (ref 0.44–1.00)
GFR, Estimated: 39 mL/min — ABNORMAL LOW (ref >60)
Glucose, Bld: 119 mg/dL — ABNORMAL HIGH (ref 70–99)
Potassium: 5.2 mmol/L — ABNORMAL HIGH (ref 3.5–5.1)
Sodium: 134 mmol/L — ABNORMAL LOW (ref 135–145)

## 2021-08-21 LAB — CBC
HCT: 38.6 % (ref 36.0–46.0)
Hemoglobin: 12.4 g/dL (ref 12.0–15.0)
MCH: 31.6 pg (ref 26.0–34.0)
MCHC: 32.1 g/dL (ref 30.0–36.0)
MCV: 98.5 fL (ref 80.0–100.0)
Platelets: 203 10*3/uL (ref 150–400)
RBC: 3.92 MIL/uL (ref 3.87–5.11)
RDW: 13.7 % (ref 11.5–15.5)
WBC: 10.5 10*3/uL (ref 4.0–10.5)
nRBC: 0 % (ref 0.0–0.2)

## 2021-08-21 LAB — POTASSIUM: Potassium: 4.6 mmol/L (ref 3.5–5.1)

## 2021-08-21 MED ORDER — APIXABAN 5 MG PO TABS
5.0000 mg | ORAL_TABLET | Freq: Two times a day (BID) | ORAL | 0 refills | Status: DC
  Filled 2021-08-21: qty 60, 30d supply, fill #0

## 2021-08-21 MED ORDER — DILTIAZEM HCL ER COATED BEADS 120 MG PO CP24
120.0000 mg | ORAL_CAPSULE | Freq: Every day | ORAL | 0 refills | Status: DC
  Filled 2021-08-21: qty 30, 30d supply, fill #0

## 2021-08-21 MED ORDER — FUROSEMIDE 40 MG PO TABS: 20.0000 mg | ORAL_TABLET | Freq: Every day | ORAL | Status: AC

## 2021-08-21 MED ORDER — SODIUM ZIRCONIUM CYCLOSILICATE 5 G PO PACK
5.0000 g | PACK | Freq: Once | ORAL | Status: AC
  Administered 2021-08-21: 5 g via ORAL
  Filled 2021-08-21: qty 1

## 2021-08-21 NOTE — TOC Benefit Eligibility Note (Signed)
Patient Teacher, English as a foreign language completed.    The patient is currently admitted and upon discharge could be taking Eliquis 5 mg.  The current 30 day co-pay is, $89.76 due to being in Coverage Gap (donut hole).   The patient is insured through [employer] Patient Advocate Specialist [place] Team Direct Number: [phone]  Fax: [phone]

## 2021-08-21 NOTE — Progress Notes (Signed)
Discharge instructions (including medications) discussed with and copy provided to patient/caregiver 

## 2021-08-21 NOTE — Discharge Instructions (Signed)

## 2021-08-21 NOTE — Plan of Care (Signed)
  Problem: Education: Goal: Knowledge of disease or condition will improve Outcome: Adequate for Discharge Goal: Understanding of medication regimen will improve Outcome: Adequate for Discharge Goal: Individualized Educational Video(s) Outcome: Adequate for Discharge

## 2021-08-21 NOTE — Discharge Summary (Signed)
Physician Discharge Summary  Sheryl Curtis BSJ:[id number] DOB: 1932-03-29 DOA: 08/18/2021  PCP: Alroy Dust, L.Marlou Sa, MD  Admit date: 08/18/2021 Discharge date: 08/21/2021  Admitted From: home Disposition:  home  Recommendations for Outpatient Follow-up:  Follow up with PCP in 1-2 weeks Please obtain BMP/CBC in one week Please follow up on the following pending results:  Home Health:no  Equipment/Devices: none  Discharge Condition: Stable Code Status:   Code Status: Full Code Diet recommendation:  Diet Order             Diet Heart Room service appropriate? Yes; Fluid consistency: Thin  Diet effective now                   Brief/Interim Summary: 87 y.o. female with medical history significant of questionable history of A. fib, CAD with stenting on Plavix, HTN, hemorrhoid with intermittent rectal bleed, CKD stage II, morbid obesity, COPD, OSA, presented with palpitations and chest pains. Symptoms started few days ago, episodic, dull like pain between shoulder blades, episodic, resolved based on no sensation palpitations or chest pains no shortness of breath. However, in the morning, patient woke up with severe chest pain radiated to the bilateral shoulders, associated with palpitations, she took 2 nitro with partial relief.Marland Kitchen  EMS was called and found patient heart rate 150s, EKG showed A. Fib. Neither patient nor daughter can recall patient has A. fib diagnosed before.  The patient did admit that she had a stroke episode last year, not on Ziopatch before. ED Course: A. fib, heart rate in 160s, patient was briefly on Cardizem drip and then heart rate converted back to sinus rhythm.  And chest pain also relieved.  Blood pressure stable, troponins 13> 29.  Cardiology consulted Patient was admitted placed on Eliquis given her high CHA2DS2-VASc score, echocardiogram showed EF 65 to 70%, also placed on Cardizem.  At this time heart rate is well controlled.  Did well with PT OT and no need for  outpatient PT.  At this time she feels well.  Cardiology has signed off and okay to discharge home on Eliquis 5 mg twice daily and discontinue Plavix.  Discharge Diagnoses:   New onset A. TML:YYTKPTW will go home on Cardizem and Eliquis.  Off Plavix.  EF stable on echo. Patient agreed to start Eliquis and to monitor her bowel movements for any blood loss.She reports that she had external hemorrhoids and has had some intermittent bleeding but none recently.  Patient used to follow-up with Dr. Oletta Lamas as outpatient GI.Seen by cardiology and signed off.   AKI on CKD stage IIIb:Baseline creatinine 1.1, trended upward to 1.59-improved to 1.3 with IV fluids.  Follow-up with outpatient PCP resume Lasix in few days. Recent Labs  Lab 08/18/21 0448 08/19/21 0211 08/20/21 0151 08/21/21 0305  BUN 15 18 25* 21  CREATININE 1.38* 1.17* 1.59* 1.31*    CAD: Continue Imdur and statin.  Hypertension: Now on Cardizem Imdur hold Avapro for now follow-up with PCP.  Chronic diastolic heart failure:Euvolemic at this time.  Resume Lasix in few days   COPD with chronic respiratory failure, hypoxemic: Wears 2 L nightly at baseline Continue her home bronc bronchodilators inhalers.   Anxiety: Her mood is a stable continue her Cymbalta, Xanax as needed  Mild hyperkalemia given dose of Lokelma and discontinued potassium supplement. K improved.  Consults: Cardiology   Subjective: Aaox3 no SON no chest pain feels well, wants to go home.  Discharge Exam: Vitals:   08/21/21 0833 08/21/21 1318  BP:  Marland Kitchen)  148/55  Pulse:  68  Resp:    Temp:  98.6 F (37 C)  SpO2: 95%    General: Pt is alert, awake, not in acute distress Cardiovascular: RRR, S1/S2 +, no rubs, no gallops Respiratory: CTA bilaterally, no wheezing, no rhonchi Abdominal: Soft, NT, ND, bowel sounds + Extremities: no edema, no cyanosis  Discharge Instructions  Discharge Instructions     Discharge instructions   Complete by: As directed     Please call call MD or return to ER for similar or worsening recurring problem that brought you to hospital or if any fever,nausea/vomiting,abdominal pain, uncontrolled pain, chest pain,  shortness of breath or any other alarming symptoms.  Please follow-up your doctor as instructed in a week time and call the office for appointment.  Please avoid alcohol, smoking, or any other illicit substance and maintain healthy habits including taking your regular medications as prescribed.  You were cared for by a hospitalist during your hospital stay. If you have any questions about your discharge medications or the care you received while you were in the hospital after you are discharged, you can call the unit and ask to speak with the hospitalist on call if the hospitalist that took care of you is not available.  Once you are discharged, your primary care physician will handle any further medical issues. Please note that NO REFILLS for any discharge medications will be authorized once you are discharged, as it is imperative that you return to your primary care physician (or establish a relationship with a primary care physician if you do not have one) for your aftercare needs so that they can reassess your need for medications and monitor your lab values   Increase activity slowly   Complete by: As directed       Allergies as of 08/21/2021       Reactions   Aspirin Anaphylaxis, Hives, Shortness Of Breath   Hydroxyquinolines Hives   Imipramine Hcl Other (See Comments)   Unknown reaction Other reaction(s): weakness/groggy   Pamelor [nortriptyline Hcl] Other (See Comments)   Unknown reaction   Prednisone Shortness Of Breath, Itching, Other (See Comments)   anxiety   Pregabalin Other (See Comments)   Gives her the shakes   Procardia [nifedipine] Other (See Comments)   Unknown reaction   Tramadol Swelling   Ace Inhibitors Other (See Comments)   cough   Hydroxyzine Hcl Itching   Influenza  Vaccines Other (See Comments)   High fever, couldn't get out of bed   Morphine And Related Other (See Comments)   Felt 'out of sorts'   Oxycodone-acetaminophen    Other reaction(s): Brain fog   Gabapentin Anxiety   Plaquenil [hydroxychloroquine Sulfate] Rash        Medication List     STOP taking these medications    amitriptyline 10 MG tablet Commonly known as: ELAVIL   clopidogrel 75 MG tablet Commonly known as: PLAVIX   Debrox 6.5 % OTIC solution Generic drug: carbamide peroxide   diphenhydrAMINE 25 mg capsule Commonly known as: Benadryl Allergy   docusate sodium 100 MG capsule Commonly known as: Colace   gabapentin 300 MG capsule Commonly known as: NEURONTIN   lidocaine 5 % Commonly known as: Lidoderm   morphine 15 MG 12 hr tablet Commonly known as: MS Contin   oxyCODONE-acetaminophen 10-325 MG tablet Commonly known as: Percocet   potassium chloride SA 20 MEQ tablet Commonly known as: KLOR-CON   pregabalin 25 MG capsule Commonly known as: Lyrica  valsartan 160 MG tablet Commonly known as: DIOVAN       TAKE these medications    ALPRAZolam 0.25 MG tablet Commonly known as: XANAX Take 0.25 mg by mouth 3 (three) times daily as needed for anxiety.   Breztri Aerosphere 160-9-4.8 MCG/ACT Aero Generic drug: Budeson-Glycopyrrol-Formoterol Inhale 2 puffs into the lungs in the morning and at bedtime.   Cartia XT 120 MG 24 hr capsule Generic drug: diltiazem Take 1 capsule (120 mg total) by mouth daily. Start taking on: August 22, 2021   Compressor Nebulizer Misc 1 Device by Does not apply route once.   DULoxetine 60 MG capsule Commonly known as: CYMBALTA Take 60 mg by mouth daily.   Eliquis 5 MG Tabs tablet Generic drug: apixaban Take 1 tablet (5 mg total) by mouth 2 (two) times daily.   Flutter Devi Use flutter device 3 times a day   furosemide 40 MG tablet Commonly known as: LASIX Take 0.5 tablets (20 mg total) by mouth daily. Check  BMP follow-up with PCP before resuming Start taking on: August 27, 2021 What changed:  how much to take additional instructions These instructions start on August 27, 2021. If you are unsure what to do until then, ask your doctor or other care provider.   ipratropium 0.03 % nasal spray Commonly known as: ATROVENT Place 2 sprays into both nostrils every 12 (twelve) hours.   isosorbide mononitrate 60 MG 24 hr tablet Commonly known as: IMDUR TAKE 1 TABLET BY MOUTH  DAILY   nitroGLYCERIN 0.4 MG SL tablet Commonly known as: NITROSTAT Place 1 tablet (0.4 mg total) under the tongue every 5 (five) minutes as needed for chest pain.   polyethylene glycol powder 17 GM/SCOOP powder Commonly known as: GLYCOLAX/MIRALAX Take 17 g by mouth daily as needed for mild constipation.   ProAir RespiClick 720 (90 Base) MCG/ACT Aepb Generic drug: Albuterol Sulfate Inhale 1-2 puffs into the lungs every 6 (six) hours as needed (for shortness of breath and/or wheezing.). What changed: Another medication with the same name was changed. Make sure you understand how and when to take each.   albuterol (2.5 MG/3ML) 0.083% nebulizer solution Commonly known as: PROVENTIL USE 1 VIAL VIA NEBULIZER EVERY 6 HOURS AS NEEDED What changed: See the new instructions.   psyllium 58.6 % powder Commonly known as: METAMUCIL Take 1 packet by mouth daily.   rosuvastatin 20 MG tablet Commonly known as: CRESTOR TAKE 1 TABLET BY MOUTH  DAILY   senna 8.6 MG Tabs tablet Commonly known as: SENOKOT Take 2 tablets (17.2 mg total) by mouth at bedtime.        Follow-up Information     Alroy Dust, L.Marlou Sa, MD Follow up in 1 week(s).   Specialty: Family Medicine Contact information: [address] [place] [address] [phone]         Jettie Booze, MD .   Specialties: Cardiology, Radiology, Interventional Cardiology Contact information: [address] [place]  [address] [phone]                Allergies  Allergen Reactions   Aspirin Anaphylaxis, Hives and Shortness Of Breath   Hydroxyquinolines Hives   Imipramine Hcl Other (See Comments)    Unknown reaction Other reaction(s): weakness/groggy   Pamelor [Nortriptyline Hcl] Other (See Comments)    Unknown reaction   Prednisone Shortness Of Breath, Itching and Other (See Comments)    anxiety   Pregabalin Other (See Comments)    Gives her  the shakes   Procardia [Nifedipine] Other (See Comments)    Unknown reaction   Tramadol Swelling   Ace Inhibitors Other (See Comments)    cough   Hydroxyzine Hcl Itching   Influenza Vaccines Other (See Comments)    High fever, couldn't get out of bed   Morphine And Related Other (See Comments)    Felt 'out of sorts'   Oxycodone-Acetaminophen     Other reaction(s): Brain fog   Gabapentin Anxiety   Plaquenil [Hydroxychloroquine Sulfate] Rash    The results of significant diagnostics from this hospitalization (including imaging, microbiology, ancillary and laboratory) are listed below for reference.    Microbiology: Recent Results (from the past 240 hour(s))  Resp Panel by RT-PCR (Flu A&B, Covid) Nasopharyngeal Swab     Status: None   Collection Time: 08/18/21 11:18 AM   Specimen: Nasopharyngeal Swab; Nasopharyngeal(NP) swabs in vial transport medium  Result Value Ref Range Status   SARS Coronavirus 2 by RT PCR NEGATIVE NEGATIVE Final    Comment: (NOTE) SARS-CoV-2 target nucleic acids are NOT DETECTED.  The SARS-CoV-2 RNA is generally detectable in upper respiratory specimens during the acute phase of infection. The lowest concentration of SARS-CoV-2 viral copies this assay can detect is 138 copies/mL. A negative result does not preclude SARS-Cov-2 infection and should not be used as the sole basis for treatment or other patient management decisions. A negative result may occur with  improper specimen collection/handling, submission of  specimen other than nasopharyngeal swab, presence of viral mutation(s) within the areas targeted by this assay, and inadequate number of viral copies(<138 copies/mL). A negative result must be combined with clinical observations, patient history, and epidemiological information. The expected result is Negative.  Fact Sheet for Patients:  EntrepreneurPulse.com.au  Fact Sheet for Healthcare Providers:  IncredibleEmployment.be  This test is no t yet approved or cleared by the [place] FDA and  has been authorized for detection and/or diagnosis of SARS-CoV-2 by FDA under an Emergency Use Authorization (EUA). This EUA will remain  in effect (meaning this test can be used) for the duration of the COVID-19 declaration under Section 564(b)(1) of the Act, 21 U.S.C.section 360bbb-3(b)(1), unless the authorization is terminated  or revoked sooner.       Influenza A by PCR NEGATIVE NEGATIVE Final   Influenza B by PCR NEGATIVE NEGATIVE Final    Comment: (NOTE) The Xpert Xpress SARS-CoV-2/FLU/RSV plus assay is intended as an aid in the diagnosis of influenza from Nasopharyngeal swab specimens and should not be used as a sole basis for treatment. Nasal washings and aspirates are unacceptable for Xpert Xpress SARS-CoV-2/FLU/RSV testing.  Fact Sheet for Patients: EntrepreneurPulse.com.au  Fact Sheet for Healthcare Providers: IncredibleEmployment.be  This test is not yet approved or cleared by the [place] FDA and has been authorized for detection and/or diagnosis of SARS-CoV-2 by FDA under an Emergency Use Authorization (EUA). This EUA will remain in effect (meaning this test can be used) for the duration of the COVID-19 declaration under Section 564(b)(1) of the Act, 21 U.S.C. section 360bbb-3(b)(1), unless the authorization is terminated or revoked.  Performed at [facility], [place] [address]  [place], [place] [address]     Procedures/Studies: DG Chest 2 View  Result Date: 08/18/2021 CLINICAL DATA:  Chest pain EXAM: CHEST - 2 VIEW COMPARISON:  07/11/2021 FINDINGS: Stable cardiomediastinal contours. Aortic atherosclerotic calcifications. No signs of pleural effusion or edema. No airspace consolidation. Osseous structures appear intact. IMPRESSION: No active cardiopulmonary abnormalities. Electronically Signed  By: Kerby Moors M.D.   On: 08/18/2021 06:53   ECHOCARDIOGRAM COMPLETE  Result Date: 08/19/2021    ECHOCARDIOGRAM REPORT   Patient Name:   Sheryl Curtis Date of Exam: 08/19/2021 Medical Rec #:  [id number]     Height:       64.0 in Accession #:    [id number]    Weight:       242.5 lb Date of Birth:  1932/05/23     BSA:          2.123 m Patient Age:    22 years      BP:           116/39 mmHg Patient Gender: F             HR:           68 bpm. Exam Location:  Inpatient Procedure: 2D Echo, Cardiac Doppler, Color Doppler and Intracardiac            Opacification Agent Indications:    Atrial fibrillation  History:        Patient has prior history of Echocardiogram examinations, most                 recent 07/08/2018. CAD, COPD, Arrythmias:Atrial Fibrillation and                 palpitations, Signs/Symptoms:Chest Pain; Risk Factors:Morbid                 obesity, Sleep Apnea and Hypertension. CKD.  Sonographer:    Dustin Flock RDCS Referring Phys: [id number] [facility]  Sonographer Comments: Technically difficult study due to poor echo windows and patient is morbidly obese. IMPRESSIONS  1. Left ventricular ejection fraction, by estimation, is 65 to 70%. The left ventricle has normal function. The left ventricle has no regional wall motion abnormalities. There is moderate concentric left ventricular hypertrophy. Left ventricular diastolic parameters are indeterminate.  2. Right ventricular systolic function is normal. The right ventricular size is not well visualized.  3. The mitral valve was  not well visualized. Trivial mitral valve regurgitation. No evidence of mitral stenosis.  4. The aortic valve was not well visualized. Aortic valve regurgitation is not visualized. No aortic stenosis is present. Comparison(s): No significant change from prior study. Conclusion(s)/Recommendation(s): Technically challenging images; valves not well seen but no significant valve disease by Doppler. Echo contrast shows normal to hyperdynamic LV function without focal wall motion abnormalities. FINDINGS  Left Ventricle: Left ventricular ejection fraction, by estimation, is 65 to 70%. The left ventricle has normal function. The left ventricle has no regional wall motion abnormalities. Definity contrast agent was given IV to delineate the left ventricular  endocardial borders. The left ventricular internal cavity size was normal in size. There is moderate concentric left ventricular hypertrophy. Left ventricular diastolic parameters are indeterminate. Right Ventricle: The right ventricular size is not well visualized. Right vetricular wall thickness was not well visualized. Right ventricular systolic function is normal. Left Atrium: Left atrial size was not well visualized. Right Atrium: Right atrial size was not well visualized. Pericardium: There is no evidence of pericardial effusion. Presence of pericardial fat pad. Mitral Valve: The mitral valve was not well visualized. There is mild thickening of the mitral valve leaflet(s). There is mild calcification of the mitral valve leaflet(s). Mild to moderate mitral annular calcification. Trivial mitral valve regurgitation. No evidence of mitral valve stenosis. Tricuspid Valve: The tricuspid valve is not well visualized. Tricuspid valve regurgitation is trivial. No  evidence of tricuspid stenosis. Aortic Valve: The aortic valve was not well visualized. Aortic valve regurgitation is not visualized. No aortic stenosis is present. Pulmonic Valve: The pulmonic valve was not well  visualized. Pulmonic valve regurgitation is not visualized. Aorta: The aortic root was not well visualized, the ascending aorta was not well visualized and the aortic arch was not well visualized. Venous: The inferior vena cava was not well visualized. IAS/Shunts: The interatrial septum was not well visualized.  LEFT VENTRICLE PLAX 2D LVIDd:         4.30 cm   Diastology LVIDs:         3.00 cm   LV e' medial:    5.11 cm/s LV PW:         1.30 cm   LV E/e' medial:  20.2 LV IVS:        1.40 cm   LV e' lateral:   5.11 cm/s LVOT diam:     1.90 cm   LV E/e' lateral: 20.2 LV SV:         53 LV SV Index:   25 LVOT Area:     2.84 cm  RIGHT VENTRICLE RV Basal diam:  2.50 cm RV S prime:     9.03 cm/s TAPSE (M-mode): 1.8 cm LEFT ATRIUM           Index        RIGHT ATRIUM           Index LA diam:      4.40 cm 2.07 cm/m   RA Area:     11.10 cm LA Vol (A4C): 50.4 ml 23.74 ml/m  RA Volume:   25.30 ml  11.92 ml/m  AORTIC VALVE LVOT Vmax:   81.60 cm/s LVOT Vmean:  51.900 cm/s LVOT VTI:    0.188 m  AORTA Ao Root diam: 3.10 cm MITRAL VALVE MV Area (PHT): 2.72 cm     SHUNTS MV Decel Time: 279 msec     Systemic VTI:  0.19 m MV E velocity: 103.00 cm/s  Systemic Diam: 1.90 cm Buford Dresser MD Electronically signed by Buford Dresser MD Signature Date/Time: 08/19/2021/5:10:16 PM    Final     Labs: BNP (last 3 results) Recent Labs    08/18/21 0950  BNP 53.6   Basic Metabolic Panel: Recent Labs  Lab 08/18/21 0448 08/19/21 0211 08/20/21 0151 08/21/21 0305 08/21/21 1252  NA 138 134* 131* 134*  --   K 5.0 3.8 4.5 5.2* 4.6  CL 101 100 97* 100  --   CO2 27 26 23 29   --   GLUCOSE 164* 134* 112* 119*  --   BUN 15 18 25* 21  --   CREATININE 1.38* 1.17* 1.59* 1.31*  --   CALCIUM 9.4 8.8* 8.8* 8.9  --    Liver Function Tests: No results for input(s): AST, ALT, ALKPHOS, BILITOT, PROT, ALBUMIN in the last 168 hours. No results for input(s): LIPASE, AMYLASE in the last 168 hours. No results for input(s):  AMMONIA in the last 168 hours. CBC: Recent Labs  Lab 08/18/21 0448 08/19/21 0211 08/20/21 0151 08/21/21 0305  WBC 9.9 10.4 10.6* 10.5  HGB 14.6 13.0 12.6 12.4  HCT 45.4 38.9 38.9 38.6  MCV 98.7 95.1 97.5 98.5  PLT 244 208 216 203   Cardiac Enzymes: No results for input(s): CKTOTAL, CKMB, CKMBINDEX, TROPONINI in the last 168 hours. BNP: Invalid input(s): POCBNP CBG: No results for input(s): GLUCAP in the last 168 hours. D-Dimer No results  for input(s): DDIMER in the last 72 hours. Hgb A1c No results for input(s): HGBA1C in the last 72 hours. Lipid Profile No results for input(s): CHOL, HDL, LDLCALC, TRIG, CHOLHDL, LDLDIRECT in the last 72 hours. Thyroid function studies No results for input(s): TSH, T4TOTAL, T3FREE, THYROIDAB in the last 72 hours.  Invalid input(s): FREET3 Anemia work up No results for input(s): VITAMINB12, FOLATE, FERRITIN, TIBC, IRON, RETICCTPCT in the last 72 hours. Urinalysis    Component Value Date/Time   COLORURINE STRAW (A) 01/27/2020 1925   APPEARANCEUR CLEAR 01/27/2020 1925   LABSPEC 1.004 (L) 01/27/2020 1925   PHURINE 7.0 01/27/2020 1925   GLUCOSEU NEGATIVE 01/27/2020 1925   HGBUR NEGATIVE 01/27/2020 1925   BILIRUBINUR NEGATIVE 01/27/2020 1925   KETONESUR NEGATIVE 01/27/2020 1925   PROTEINUR NEGATIVE 01/27/2020 1925   UROBILINOGEN 0.2 07/01/2015 1850   NITRITE NEGATIVE 01/27/2020 1925   LEUKOCYTESUR NEGATIVE 01/27/2020 1925   Sepsis Labs Invalid input(s): PROCALCITONIN,  WBC,  LACTICIDVEN Microbiology Recent Results (from the past 240 hour(s))  Resp Panel by RT-PCR (Flu A&B, Covid) Nasopharyngeal Swab     Status: None   Collection Time: 08/18/21 11:18 AM   Specimen: Nasopharyngeal Swab; Nasopharyngeal(NP) swabs in vial transport medium  Result Value Ref Range Status   SARS Coronavirus 2 by RT PCR NEGATIVE NEGATIVE Final    Comment: (NOTE) SARS-CoV-2 target nucleic acids are NOT DETECTED.  The SARS-CoV-2 RNA is generally detectable  in upper respiratory specimens during the acute phase of infection. The lowest concentration of SARS-CoV-2 viral copies this assay can detect is 138 copies/mL. A negative result does not preclude SARS-Cov-2 infection and should not be used as the sole basis for treatment or other patient management decisions. A negative result may occur with  improper specimen collection/handling, submission of specimen other than nasopharyngeal swab, presence of viral mutation(s) within the areas targeted by this assay, and inadequate number of viral copies(<138 copies/mL). A negative result must be combined with clinical observations, patient history, and epidemiological information. The expected result is Negative.  Fact Sheet for Patients:  EntrepreneurPulse.com.au  Fact Sheet for Healthcare Providers:  IncredibleEmployment.be  This test is no t yet approved or cleared by the [place] FDA and  has been authorized for detection and/or diagnosis of SARS-CoV-2 by FDA under an Emergency Use Authorization (EUA). This EUA will remain  in effect (meaning this test can be used) for the duration of the COVID-19 declaration under Section 564(b)(1) of the Act, 21 U.S.C.section 360bbb-3(b)(1), unless the authorization is terminated  or revoked sooner.       Influenza A by PCR NEGATIVE NEGATIVE Final   Influenza B by PCR NEGATIVE NEGATIVE Final    Comment: (NOTE) The Xpert Xpress SARS-CoV-2/FLU/RSV plus assay is intended as an aid in the diagnosis of influenza from Nasopharyngeal swab specimens and should not be used as a sole basis for treatment. Nasal washings and aspirates are unacceptable for Xpert Xpress SARS-CoV-2/FLU/RSV testing.  Fact Sheet for Patients: EntrepreneurPulse.com.au  Fact Sheet for Healthcare Providers: IncredibleEmployment.be  This test is not yet approved or cleared by the [place] FDA and has  been authorized for detection and/or diagnosis of SARS-CoV-2 by FDA under an Emergency Use Authorization (EUA). This EUA will remain in effect (meaning this test can be used) for the duration of the COVID-19 declaration under Section 564(b)(1) of the Act, 21 U.S.C. section 360bbb-3(b)(1), unless the authorization is terminated or revoked.  Performed at [facility], [place] [address] [place], [place] [address]  Time coordinating discharge: 25 minutes  SIGNED: Antonieta Pert, MD  [facility] 08/21/2021, 1:58 PM  If 7PM-7AM, please contact night-coverage [email or web address]

## 2021-08-21 NOTE — Progress Notes (Signed)
Pt K+ level down to 4.6, Dr Lupita Leash notified, no new orders. Pt will be going home, waiting for daughter to arrive.

## 2021-08-25 DIAGNOSIS — J9611 Chronic respiratory failure with hypoxia: Secondary | ICD-10-CM | POA: Diagnosis not present

## 2021-08-29 ENCOUNTER — Other Ambulatory Visit: Payer: Self-pay | Admitting: Interventional Cardiology

## 2021-08-29 DIAGNOSIS — R42 Dizziness and giddiness: Secondary | ICD-10-CM | POA: Diagnosis not present

## 2021-08-29 DIAGNOSIS — H903 Sensorineural hearing loss, bilateral: Secondary | ICD-10-CM | POA: Diagnosis not present

## 2021-08-30 ENCOUNTER — Telehealth: Payer: Self-pay | Admitting: Pharmacist

## 2021-08-30 NOTE — Telephone Encounter (Signed)
Pharmacy Transitions of Care Follow-up Telephone Call  Date of discharge: 08/21/2021  Discharge Diagnosis: New onset Afib  Medication changes made at discharge: START taking: Cartia XT (diltiazem) Start taking on: August 22, 2021 Eliquis (apixaban) CHANGE how you take: furosemide (LASIX) STOP taking: amitriptyline 10 MG tablet (ELAVIL) clopidogrel 75 MG tablet (PLAVIX) Debrox 6.5 % OTIC solution (carbamide peroxide) diphenhydrAMINE 25 mg capsule (Benadryl Allergy) docusate sodium 100 MG capsule (Colace) gabapentin 300 MG capsule (NEURONTIN) lidocaine 5 % (Lidoderm) morphine 15 MG 12 hr tablet (MS Contin) oxyCODONE-acetaminophen 10-325 MG tablet (Percocet) potassium chloride SA 20 MEQ tablet (KLOR-CON) pregabalin 25 MG capsule (Lyrica) valsartan 160 MG tablet (DIOVAN) Medication changes verified by the patient? Yes    Medication Accessibility:  Home Pharmacy: N/A  Was the patient provided with refills on discharged medications? No  Is the patient able to afford medications? Yes   Medication Review:  APIXABAN (ELIQUIS)  Apixaban 5mg  BID  - Discussed importance of taking medication around the same time everyday  - Reviewed potential DDIs with patient  - Advised patient of medications to avoid (NSAIDs, ASA)  - Educated that Tylenol (acetaminophen) will be the preferred analgesic to prevent risk of bleeding  - Emphasized importance of monitoring for signs and symptoms of bleeding (abnormal bruising, prolonged bleeding, nose bleeds, bleeding from gums, discolored urine, black tarry stools)  - Advised patient to alert all providers of anticoagulation therapy prior to starting a new medication or having a procedure   Follow-up Appointments:  Follow up with Larae Grooms, MD Specialty: Cardiology, Radiology, Interventional Cardiology [facility] Cardiovascular Division [address] [place] [address] [phone] Follow up with Donnie Coffin, MD in 1  week(s) Specialty: Family Medicine [place] [address] [place] [address] [phone] [place] PATIENT OUTREACH CALL with Verlin Grills, RN Tuesday Oct 02, 2021 1:00 PM THIS VISIT IS PHONE CALL ONLY. Nov 09 2021 Office Visit with Larae Grooms, MD Friday Nov 09, 2021 2:00 PM (Arrive by 1:45 PM) Please arrive 15 minutes prior to your appointment. This will allow [place] to verify and update your medical record and ensure a full appointment for you within the time allotted. [facility] [address] [place] [address] [phone]  Final Patient Assessment: Pt had no questions or concerns about medications. Reported taking them correctly with no side effects and planned to keep apt with provider scheduled for today (08/30/21)

## 2021-09-05 ENCOUNTER — Telehealth: Payer: Self-pay | Admitting: Pharmacy Technician

## 2021-09-05 DIAGNOSIS — Z596 Low income: Secondary | ICD-10-CM

## 2021-09-05 NOTE — Progress Notes (Signed)
[facility])                                            [place] Team    09/05/2021  ARIELY RIDDELL 08-05-1932 [id number]  FOR 2023 RE ENROLLMENT                                      Medication Assistance Referral  Referral From: [facility]  Medication/Company: Judithann Sauger / [place] Patient application portion:  N/A per program guidelines patient should be automatically enrolled in 2023 as patient has Medicare D Provider application portion: Faxed  to Dr. Elsworth Soho Provider address/fax verified via: Office website   Kristien Salatino P. Teka Chanda, [place]  [phone]

## 2021-09-07 ENCOUNTER — Encounter: Payer: Self-pay | Admitting: Interventional Cardiology

## 2021-09-07 ENCOUNTER — Ambulatory Visit: Payer: Medicare Other | Admitting: Interventional Cardiology

## 2021-09-07 ENCOUNTER — Other Ambulatory Visit: Payer: Self-pay

## 2021-09-07 VITALS — BP 134/60 | HR 72 | Ht 64.0 in | Wt 225.0 lb

## 2021-09-07 DIAGNOSIS — I25118 Atherosclerotic heart disease of native coronary artery with other forms of angina pectoris: Secondary | ICD-10-CM | POA: Diagnosis not present

## 2021-09-07 DIAGNOSIS — N183 Chronic kidney disease, stage 3 unspecified: Secondary | ICD-10-CM | POA: Diagnosis not present

## 2021-09-07 DIAGNOSIS — I4819 Other persistent atrial fibrillation: Secondary | ICD-10-CM | POA: Diagnosis not present

## 2021-09-07 DIAGNOSIS — Z7901 Long term (current) use of anticoagulants: Secondary | ICD-10-CM

## 2021-09-07 DIAGNOSIS — E782 Mixed hyperlipidemia: Secondary | ICD-10-CM

## 2021-09-07 DIAGNOSIS — I10 Essential (primary) hypertension: Secondary | ICD-10-CM | POA: Diagnosis not present

## 2021-09-07 MED ORDER — DILTIAZEM HCL ER COATED BEADS 120 MG PO CP24: 120.0000 mg | ORAL_CAPSULE | Freq: Every day | ORAL | 2 refills | Status: DC

## 2021-09-07 MED ORDER — NITROGLYCERIN 0.4 MG SL SUBL: 0.4000 mg | SUBLINGUAL_TABLET | SUBLINGUAL | 3 refills | Status: AC | PRN

## 2021-09-07 MED ORDER — APIXABAN 5 MG PO TABS: 5.0000 mg | ORAL_TABLET | Freq: Two times a day (BID) | ORAL | 1 refills | Status: DC

## 2021-09-07 NOTE — Progress Notes (Signed)
Cardiology Office Note   Date:  09/07/2021   ID:  [name], DOB 12/26/1931, MRN [id number]  PCP:  Collene Leyden, MD    Chief Complaint  Patient presents with   Follow-up   CAD,AFib  Wt Readings from Last 3 Encounters:  09/07/21 225 lb (102.1 kg)  08/18/21 242 lb 8.1 oz (110 kg)  06/13/21 220 lb (99.8 kg)       History of Present Illness: Sheryl Curtis is a 87 y.o. female    Who has had CAD and an LAD stent in 2014.    She had a negative monitor for palpitations in the past.     At the Dec 2018 visit, She reported chronic DOE and weight gain since starting steroid injections for her back.    Echo was recommended in June 2019 for [facility] but this did not happen.  SHe had a fall and had some more SHOB.  CXR was recommended but did not happen as well. She fell on her right hip and shoulder.  She could not walk easily for a few weeks.     In the past, she received steroid injections.  She has used CBD for pain relief.     She had COVID in Jan 2021.   Not hospitalized.   Hospitalized in October 2022 with atrial fibrillation: "New onset A. YCX:KGYJEHU will go home on Cardizem and Eliquis.  Off Plavix.  EF stable on echo. Patient agreed to start Eliquis and to monitor her bowel movements for any blood loss.She reports that she had external hemorrhoids and has had some intermittent bleeding but none recently.  Patient used to follow-up with Dr. Oletta Lamas as outpatient GI.Seen by cardiology and signed off.   AKI on CKD stage IIIb:Baseline creatinine 1.1, trended upward to 1.59-improved to 1.3 with IV fluids.  Follow-up with outpatient PCP resume Lasix in few days."   Had mildly elevated troponin.   "Left ventricular ejection fraction, by estimation, is 65 to 70%. The  left ventricle has normal function. The left ventricle has no regional  wall motion abnormalities. There is moderate concentric left ventricular  hypertrophy. Left ventricular  diastolic parameters are  indeterminate.   2. Right ventricular systolic function is normal. The right ventricular  size is not well visualized.   3. The mitral valve was not well visualized. Trivial mitral valve  regurgitation. No evidence of mitral stenosis.   4. The aortic valve was not well visualized. Aortic valve regurgitation  is not visualized. No aortic stenosis is present. "  She is felt well since leaving the hospital.  Biggest complaint is constipation.  Denies : Chest pain. Dizziness. Leg edema. Nitroglycerin use. Orthopnea. Palpitations. Paroxysmal nocturnal dyspnea. Shortness of breath. Syncope.     Past Medical History:  Diagnosis Date   ALLERGIC RHINITIS    Atrial fibrillation (HCC)    CANCER, COLORECTAL    CHEST PAIN, ATYPICAL    CKD (chronic kidney disease)    DYSPNEA    Essential hypertension, benign    FIBROMYALGIA    G E R D    History of stress test    Myoview 8/16:  EF 69%, anterior and apical defect c/w breast attenuation; Low Risk    INSOMNIA    Morbid obesity ([place])    Other and unspecified angina pectoris    Sleep apnea     Past Surgical History:  Procedure Laterality Date   ABDOMINAL HYSTERECTOMY     ANKLE FRACTURE SURGERY  BACK SURGERY     BREAST SURGERY     COLON SURGERY     CORONARY STENT PLACEMENT     PERCUTANEOUS CORONARY STENT INTERVENTION (PCI-S) N/A 05/19/2013   Procedure: PERCUTANEOUS CORONARY STENT INTERVENTION (PCI-S);  Surgeon: Jettie Booze, MD;  Location: [facility] CATH LAB;  Service: Cardiovascular;  Laterality: N/A;     Current Outpatient Medications  Medication Sig Dispense Refill   albuterol (PROVENTIL) (2.5 MG/3ML) 0.083% nebulizer solution USE 1 VIAL VIA NEBULIZER EVERY 6 HOURS AS NEEDED 1080 mL 1   Albuterol Sulfate (PROAIR RESPICLICK) 086 (90 Base) MCG/ACT AEPB Inhale 1-2 puffs into the lungs every 6 (six) hours as needed (for shortness of breath and/or wheezing.). 1 each 6   ALPRAZolam (XANAX) 0.25 MG tablet Take 0.25 mg by mouth 3 (three)  times daily as needed for anxiety.     apixaban (ELIQUIS) 5 MG TABS tablet Take 1 tablet (5 mg total) by mouth 2 (two) times daily. 60 tablet 0   Budeson-Glycopyrrol-Formoterol (BREZTRI AEROSPHERE) 160-9-4.8 MCG/ACT AERO Inhale 2 puffs into the lungs in the morning and at bedtime. 5.9 g 0   diltiazem (CARDIZEM CD) 120 MG 24 hr capsule Take 1 capsule (120 mg total) by mouth daily. 30 capsule 0   DULoxetine (CYMBALTA) 60 MG capsule Take 60 mg by mouth daily.     furosemide (LASIX) 40 MG tablet Take 0.5 tablets (20 mg total) by mouth daily. Check BMP follow-up with PCP before resuming     isosorbide mononitrate (IMDUR) 60 MG 24 hr tablet TAKE 1 TABLET BY MOUTH  DAILY 30 tablet 4   levothyroxine (SYNTHROID) 25 MCG tablet Take 25 mcg by mouth daily before breakfast.     Nebulizers (COMPRESSOR NEBULIZER) MISC 1 Device by Does not apply route once. 1 each 0   nitroGLYCERIN (NITROSTAT) 0.4 MG SL tablet Place 1 tablet (0.4 mg total) under the tongue every 5 (five) minutes as needed for chest pain. 25 tablet 3   polyethylene glycol powder (GLYCOLAX/MIRALAX) 17 GM/SCOOP powder Take 17 g by mouth daily as needed for mild constipation.     psyllium (METAMUCIL) 58.6 % powder Take 1 packet by mouth daily.     Respiratory Therapy Supplies (FLUTTER) DEVI Use flutter device 3 times a day 1 each 0   rosuvastatin (CRESTOR) 20 MG tablet TAKE 1 TABLET BY MOUTH  DAILY 90 tablet 3   senna (SENOKOT) 8.6 MG TABS tablet Take 2 tablets (17.2 mg total) by mouth at bedtime. 120 tablet 0   No current facility-administered medications for this visit.    Allergies:   Aspirin, Hydroxyquinolines, Imipramine hcl, Pamelor [nortriptyline hcl], Prednisone, Pregabalin, Procardia [nifedipine], Tramadol, Ace inhibitors, Hydroxyzine hcl, Influenza vaccines, Morphine and related, Oxycodone-acetaminophen, Gabapentin, and Plaquenil [hydroxychloroquine sulfate]    Social History:  The patient  reports that she quit smoking about 58 years  ago. Her smoking use included cigarettes. She started smoking about 71 years ago. She has a 12.00 pack-year smoking history. She has never used smokeless tobacco. She reports that she does not drink alcohol and does not use drugs.   Family History:  The patient's family history includes Bone cancer in her sister; CAD in her brother; Heart attack (age of onset: 40) in her brother; Throat cancer in her daughter.    ROS:  Please see the history of present illness.   Otherwise, review of systems are positive for constipation.   All other systems are reviewed and negative.    PHYSICAL EXAM: VS:  BP  134/60   Pulse 72   Ht 5\' 4"  (1.626 m)   Wt 225 lb (102.1 kg)   SpO2 95%   BMI 38.62 kg/m  , BMI Body mass index is 38.62 kg/m. GEN: Well nourished, well developed, in no acute distress, in a wheel chair HEENT: normal Neck: no JVD, carotid bruits, or masses Cardiac: irregularly irregular; no murmurs, rubs, or gallops,no edema  Respiratory:  clear to auscultation bilaterally, normal work of breathing GI: soft, nontender, nondistended, + BS MS: no deformity or atrophy Skin: warm and dry, no rash Neuro:  Strength and sensation are intact Psych: euthymic mood, full affect   EKG:   The ekg ordered today demonstrates A. fib, rate controlled, no ST segment changes   Recent Labs: 09/18/2020: ALT 20; NT-Pro BNP 271 08/18/2021: B Natriuretic Peptide 45.6 08/21/2021: BUN 21; Creatinine, Ser 1.31; Hemoglobin 12.4; Platelets 203; Potassium 4.6; Sodium 134   Lipid Panel    Component Value Date/Time   CHOL 177 05/13/2019 1005   TRIG 112 05/13/2019 1005   HDL 51 05/13/2019 1005   CHOLHDL 3.5 05/13/2019 1005   LDLCALC 104 (H) 05/13/2019 1005     Other studies Reviewed: Additional studies/ records that were reviewed today with results demonstrating: Hospital records and echo reviewed; Cr 1.5 down to 1.3.   ASSESSMENT AND PLAN:  CAD: No angina on medical therapy.  Now off Plavix.  AFib: Rate  controlled.  Eliquis for stroke prevention.  Continue diltiazem.  Of note, constipation predated starting Diltiazem.  Diltiazem is effective at rate control and well tolerated.  Would not stop diltiazem to try to help with constipation. Anticoagulated: Hbg 12.4.   Hyperlipidemia: The current medical regimen is effective;  continue present plan and medications.  LDL 33 in 12/2020.  HTN: The current medical regimen is effective;  continue present plan and medications.  Valsartan was stopped in hospital and replaced with Diltiazem. CRI: Stay well-hydrated.  Avoid nephrotoxins like NSAIDs.  Drinking more water will also help with constipation.  I encouraged her to eat more fiber rich foods.   Current medicines are reviewed at length with the patient today.  The patient concerns regarding her medicines were addressed.  The following changes have been made:  No change  Labs/ tests ordered today include:  No orders of the defined types were placed in this encounter.   Recommend 150 minutes/week of aerobic exercise Low fat, low carb, high fiber diet recommended  Disposition:   FU in 6 months   Signed, Larae Grooms, MD  09/07/2021 9:45 AM    [facility] [place], [place], [place]  [address] Phone: [phone]; Fax: [phone]

## 2021-09-07 NOTE — Patient Instructions (Signed)
Medication Instructions:  Your physician recommends that you continue on your current medications as directed. Please refer to the Current Medication list given to you today.  *If you need a refill on your cardiac medications before your next appointment, please call your pharmacy*   Lab Work: none If you have labs (blood work) drawn today and your tests are completely normal, you will receive your results only by: [place] (if you have MyChart) OR A paper copy in the mail If you have any lab test that is abnormal or we need to change your treatment, we will call you to review the results.   Testing/Procedures: none   Follow-Up: At [facility], you and your health needs are our priority.  As part of our continuing mission to provide you with exceptional heart care, we have created designated Provider Care Teams.  These Care Teams include your primary Cardiologist (physician) and Advanced Practice Providers (APPs -  Physician Assistants and Nurse Practitioners) who all work together to provide you with the care you need, when you need it.  We recommend signing up for the patient portal called "MyChart".  Sign up information is provided on this After Visit Summary.  MyChart is used to connect with patients for Virtual Visits (Telemedicine).  Patients are able to view lab/test results, encounter notes, upcoming appointments, etc.  Non-urgent messages can be sent to your provider as well.   To learn more about what you can do with MyChart, go to [email or web address].    Your next appointment:   6 month(s)  The format for your next appointment:   In Person  Provider:   Larae Grooms, MD     Other Instructions  High-Fiber Eating Plan Fiber, also called dietary fiber, is a type of carbohydrate. It is found foods such as fruits, vegetables, whole grains, and beans. A high-fiber diet can have many health benefits. Your health care provider may recommend a high-fiber diet  to help: Prevent constipation. Fiber can make your bowel movements more regular. Lower your cholesterol. Relieve the following conditions: Inflammation of veins in the anus (hemorrhoids). Inflammation of specific areas of the digestive tract (uncomplicated diverticulosis). A problem of the large intestine, also called the colon, that sometimes causes pain and diarrhea (irritable bowel syndrome, or IBS). Prevent overeating as part of a weight-loss plan. Prevent heart disease, type 2 diabetes, and certain cancers. What are tips for following this plan? Reading food labels  Check the nutrition facts label on food products for the amount of dietary fiber. Choose foods that have 5 grams of fiber or more per serving. The goals for recommended daily fiber intake include: Men (age 25 or younger): 34-38 g. Men (over age 41): 28-34 g. Women (age 44 or younger): 25-28 g. Women (over age 60): 22-25 g. Your daily fiber goal is _____________ g. Shopping Choose whole fruits and vegetables instead of processed forms, such as apple juice or applesauce. Choose a wide variety of high-fiber foods such as avocados, lentils, oats, and kidney beans. Read the nutrition facts label of the foods you choose. Be aware of foods with added fiber. These foods often have high sugar and sodium amounts per serving. Cooking Use whole-grain flour for baking and cooking. Cook with brown rice instead of white rice. Meal planning Start the day with a breakfast that is high in fiber, such as a cereal that contains 5 g of fiber or more per serving. Eat breads and cereals that are made with whole-grain flour instead of  refined flour or white flour. Eat brown rice, bulgur wheat, or millet instead of white rice. Use beans in place of meat in soups, salads, and pasta dishes. Be sure that half of the grains you eat each day are whole grains. General information You can get the recommended daily intake of dietary fiber  by: Eating a variety of fruits, vegetables, grains, nuts, and beans. Taking a fiber supplement if you are not able to take in enough fiber in your diet. It is better to get fiber through food than from a supplement. Gradually increase how much fiber you consume. If you increase your intake of dietary fiber too quickly, you may have bloating, cramping, or gas. Drink plenty of water to help you digest fiber. Choose high-fiber snacks, such as berries, raw vegetables, nuts, and popcorn. What foods should I eat? Fruits Berries. Pears. Apples. Oranges. Avocado. Prunes and raisins. Dried figs. Vegetables Sweet potatoes. Spinach. Kale. Artichokes. Cabbage. Broccoli. Cauliflower. Green peas. Carrots. Squash. Grains Whole-grain breads. Multigrain cereal. Oats and oatmeal. Brown rice. Barley. Bulgur wheat. Millet. Quinoa. Bran muffins. Popcorn. Rye wafer crackers. Meats and other proteins Navy beans, kidney beans, and pinto beans. Soybeans. Split peas. Lentils. Nuts and seeds. Dairy Fiber-fortified yogurt. Beverages Fiber-fortified soy milk. Fiber-fortified orange juice. Other foods Fiber bars. The items listed above may not be a complete list of recommended foods and beverages. Contact a dietitian for more information. What foods should I avoid? Fruits Fruit juice. Cooked, strained fruit. Vegetables Fried potatoes. Canned vegetables. Well-cooked vegetables. Grains White bread. Pasta made with refined flour. White rice. Meats and other proteins Fatty cuts of meat. Fried chicken or fried fish. Dairy Milk. Yogurt. Cream cheese. Sour cream. Fats and oils Butters. Beverages Soft drinks. Other foods Cakes and pastries. The items listed above may not be a complete list of foods and beverages to avoid. Talk with your dietitian about what choices are best for you. Summary Fiber is a type of carbohydrate. It is found in foods such as fruits, vegetables, whole grains, and beans. A high-fiber  diet has many benefits. It can help to prevent constipation, lower blood cholesterol, aid weight loss, and reduce your risk of heart disease, diabetes, and certain cancers. Increase your intake of fiber gradually. Increasing fiber too quickly may cause cramping, bloating, and gas. Drink plenty of water while you increase the amount of fiber you consume. The best sources of fiber include whole fruits and vegetables, whole grains, nuts, seeds, and beans. This information is not intended to replace advice given to you by your health care provider. Make sure you discuss any questions you have with your health care provider. Document Revised: 02/17/2020 Document Reviewed: 02/17/2020 Elsevier Patient Education  2022 Elsevier Inc.   

## 2021-09-17 ENCOUNTER — Telehealth: Payer: Self-pay | Admitting: Pharmacy Technician

## 2021-09-17 DIAGNOSIS — Z596 Low income: Secondary | ICD-10-CM

## 2021-09-17 NOTE — Progress Notes (Signed)
[place] [facility])                                            [place] Team    09/17/2021  Sheryl Curtis 1932/08/09 [id number]  Received provider portion(s) of patient assistance application(s) for [facility]. Faxed completed application and required documents into [place].   Carolee Channell P. Verdene Creson, [place]  [phone]

## 2021-09-24 ENCOUNTER — Emergency Department (HOSPITAL_COMMUNITY)
Admission: EM | Admit: 2021-09-24 | Discharge: 2021-09-24 | Disposition: A | Discharge status: Home / Self Care | Payer: Medicare Other | Attending: Emergency Medicine | Admitting: Emergency Medicine

## 2021-09-24 ENCOUNTER — Other Ambulatory Visit: Payer: Self-pay

## 2021-09-24 ENCOUNTER — Telehealth: Payer: Self-pay | Admitting: Interventional Cardiology

## 2021-09-24 ENCOUNTER — Encounter (HOSPITAL_COMMUNITY): Payer: Self-pay | Admitting: *Deleted

## 2021-09-24 ENCOUNTER — Telehealth: Payer: Self-pay | Admitting: Pharmacist

## 2021-09-24 ENCOUNTER — Emergency Department (HOSPITAL_COMMUNITY): Payer: Medicare Other

## 2021-09-24 DIAGNOSIS — J4531 Mild persistent asthma with (acute) exacerbation: Secondary | ICD-10-CM | POA: Insufficient documentation

## 2021-09-24 DIAGNOSIS — R002 Palpitations: Secondary | ICD-10-CM | POA: Diagnosis not present

## 2021-09-24 DIAGNOSIS — N183 Chronic kidney disease, stage 3 unspecified: Secondary | ICD-10-CM | POA: Diagnosis not present

## 2021-09-24 DIAGNOSIS — R0789 Other chest pain: Secondary | ICD-10-CM | POA: Diagnosis not present

## 2021-09-24 DIAGNOSIS — Z951 Presence of aortocoronary bypass graft: Secondary | ICD-10-CM | POA: Diagnosis not present

## 2021-09-24 DIAGNOSIS — I4891 Unspecified atrial fibrillation: Secondary | ICD-10-CM

## 2021-09-24 DIAGNOSIS — I5032 Chronic diastolic (congestive) heart failure: Secondary | ICD-10-CM | POA: Diagnosis not present

## 2021-09-24 DIAGNOSIS — R079 Chest pain, unspecified: Secondary | ICD-10-CM | POA: Diagnosis not present

## 2021-09-24 DIAGNOSIS — Z87891 Personal history of nicotine dependence: Secondary | ICD-10-CM | POA: Diagnosis not present

## 2021-09-24 DIAGNOSIS — I2510 Atherosclerotic heart disease of native coronary artery without angina pectoris: Secondary | ICD-10-CM | POA: Diagnosis not present

## 2021-09-24 DIAGNOSIS — Z7951 Long term (current) use of inhaled steroids: Secondary | ICD-10-CM | POA: Diagnosis not present

## 2021-09-24 DIAGNOSIS — Z85038 Personal history of other malignant neoplasm of large intestine: Secondary | ICD-10-CM | POA: Diagnosis not present

## 2021-09-24 DIAGNOSIS — Z7901 Long term (current) use of anticoagulants: Secondary | ICD-10-CM | POA: Diagnosis not present

## 2021-09-24 DIAGNOSIS — I130 Hypertensive heart and chronic kidney disease with heart failure and stage 1 through stage 4 chronic kidney disease, or unspecified chronic kidney disease: Secondary | ICD-10-CM | POA: Diagnosis not present

## 2021-09-24 DIAGNOSIS — R0902 Hypoxemia: Secondary | ICD-10-CM | POA: Diagnosis not present

## 2021-09-24 DIAGNOSIS — Z79899 Other long term (current) drug therapy: Secondary | ICD-10-CM | POA: Diagnosis not present

## 2021-09-24 LAB — CBC
HCT: 44.5 % (ref 36.0–46.0)
Hemoglobin: 14 g/dL (ref 12.0–15.0)
MCH: 31 pg (ref 26.0–34.0)
MCHC: 31.5 g/dL (ref 30.0–36.0)
MCV: 98.5 fL (ref 80.0–100.0)
Platelets: 237 10*3/uL (ref 150–400)
RBC: 4.52 MIL/uL (ref 3.87–5.11)
RDW: 13.6 % (ref 11.5–15.5)
WBC: 8.6 10*3/uL (ref 4.0–10.5)
nRBC: 0 % (ref 0.0–0.2)

## 2021-09-24 LAB — BASIC METABOLIC PANEL
Anion gap: 7 (ref 5–15)
BUN: 12 mg/dL (ref 8–23)
CO2: 28 mmol/L (ref 22–32)
Calcium: 9.1 mg/dL (ref 8.9–10.3)
Chloride: 102 mmol/L (ref 98–111)
Creatinine, Ser: 1.04 mg/dL — ABNORMAL HIGH (ref 0.44–1.00)
GFR, Estimated: 52 mL/min — ABNORMAL LOW (ref >60)
Glucose, Bld: 110 mg/dL — ABNORMAL HIGH (ref 70–99)
Potassium: 4.2 mmol/L (ref 3.5–5.1)
Sodium: 137 mmol/L (ref 135–145)

## 2021-09-24 LAB — CBG MONITORING, ED: Glucose-Capillary: 125 mg/dL — ABNORMAL HIGH (ref 70–99)

## 2021-09-24 LAB — TROPONIN I (HIGH SENSITIVITY): Troponin I (High Sensitivity): 24 ng/L — ABNORMAL HIGH (ref <18)

## 2021-09-24 MED ORDER — APIXABAN 5 MG PO TABS: 5.0000 mg | ORAL_TABLET | Freq: Two times a day (BID) | ORAL | 5 refills | Status: DC

## 2021-09-24 MED ORDER — DILTIAZEM LOAD VIA INFUSION
10.0000 mg | Freq: Once | INTRAVENOUS | Status: AC
  Administered 2021-09-24: 11:00:00 10 mg via INTRAVENOUS
  Filled 2021-09-24: qty 10

## 2021-09-24 MED ORDER — DILTIAZEM HCL-DEXTROSE 125-5 MG/125ML-% IV SOLN (PREMIX)
5.0000 mg/h | INTRAVENOUS | Status: DC
  Administered 2021-09-24: 11:00:00 5 mg/h via INTRAVENOUS
  Filled 2021-09-24: qty 125

## 2021-09-24 NOTE — Telephone Encounter (Signed)
Patient was seen in ED today.

## 2021-09-24 NOTE — Telephone Encounter (Signed)
Patient's daughter called she can't afford eliquis.  It's going to cost her $500.  They want to know if there is something else she can take.

## 2021-09-24 NOTE — Telephone Encounter (Signed)
I called pt daughter. She states pharmacy told her it would be $500. That seems a little high even for coverage gap. She has not used the free 30 day coupon. I was able to activate one for her. Per daughter she will be able to afford $47/month which I what her cost will be come Jan 1. I was able to activate a free 30 day card but when pharmacy went to run it it rejected as haven been used before. Per pharmacy they quoted her ~130 for a 30 day supply She paid for 10 days worth a few days ago. I will give 3 weeks worth to get her to Jan 1 Called daughter and she will pick up.

## 2021-09-24 NOTE — Telephone Encounter (Signed)
-----   Message from Werner Lean, MD sent at 09/24/2021  2:14 PM EST ----- Regarding: Eliquis Assist Paperwork Hey all,  Ms. Allmon came in with AF RVR, spontaneously converted and is planning to go home. Her daughter has been trying to reach out to use because of the price of her DOAC and wanted to see if she would be eligible for PT assistance.  The discharged ED doc asked if we can do that prior to [place] discharging her, but that's likely not feasible.  Wanted to see if we could come up with a plan for assistance paperwork OR to get her on coumadin.    Thanks for your thoughts, MAC

## 2021-09-24 NOTE — ED Provider Notes (Signed)
[facility] EMERGENCY DEPARTMENT Provider Note   CSN: [id number] Arrival date & time: 09/24/21  0086     History Chief Complaint  Patient presents with   Chest Pain    Sheryl Curtis is a 87 y.o. female.   Chest Pain  Patient presented to the ED for evaluation of chest pain.  Patient states that started this morning maybe around 8:00.  She began having pain in the center of her chest that was rating to her left arm.  Pain was sharp in nature.  Patient also experienced palpitations, shortness of breath, and tachycardia.  She felt slightly diaphoretic but denied any nausea.  She is not had any cough..  No leg swelling.  No fevers or chills.  Patient does have history of A. fib and was recently in the hospital for this 2 weeks ago.  Patient states her presentation today is very similar to that episode.  EMS was called and she was given 10 mg of Cardizem by EMS.  She has noticed some improvement  Past Medical History:  Diagnosis Date   ALLERGIC RHINITIS    Atrial fibrillation (HCC)    CANCER, COLORECTAL    CHEST PAIN, ATYPICAL    CKD (chronic kidney disease)    DYSPNEA    Essential hypertension, benign    FIBROMYALGIA    G E R D    History of stress test    Myoview 8/16:  EF 69%, anterior and apical defect c/w breast attenuation; Low Risk    INSOMNIA    Morbid obesity ([place])    Other and unspecified angina pectoris    Sleep apnea     Patient Active Problem List   Diagnosis Date Noted   Lumbar facet arthropathy 06/05/2021   Lumbar degenerative disc disease 06/05/2021   Lumbar facet joint pain 06/05/2021   Lumbar radicular pain 06/05/2021   Chronic pain syndrome 06/05/2021   Chronic respiratory failure with hypoxia ([place]) 01/18/2021   Chronic diastolic heart failure ([place]) 01/18/2021   Medication management 09/18/2020   Healthcare maintenance 09/18/2020   Mild persistent asthma without complication 76/19/5093   Bilateral sensorineural hearing loss  09/17/2019   Dysphagia 06/14/2019   Neck pain, chronic 04/21/2019   Physical deconditioning 12/09/2018   CKD (chronic kidney disease)    Morbid obesity ([place])    Mild persistent asthma with (acute) exacerbation 07/16/2018   Cough 07/16/2018   OSA (obstructive sleep apnea) 07/30/2017   Trochanteric bursitis, right hip 04/21/2017   Nocturnal hypoxia 03/31/2017   Sinus bradycardia 08/05/2016   Hypertensive urgency 08/03/2016   Asthma 02/19/2016   Chronic kidney disease (CKD), stage III (moderate) (HCC) 07/05/2015   CAFL (chronic airflow limitation) ([place]) 07/05/2015   Generalized OA 07/05/2015   Detrusor muscle hypertonia 07/05/2015   H/O malignant neoplasm of colon 07/05/2015   Elevated troponin 07/02/2015   UTI (lower urinary tract infection) 07/01/2015   Fibromyalgia syndrome 07/01/2015   Hyponatremia 07/01/2015   Urinary tract infectious disease    Pulmonary nodules/lesions, multiple 04/11/2014   Abdominal pain, generalized 04/07/2014   Coronary atherosclerosis of native coronary artery 10/12/2013   Essential hypertension, benign    Other and unspecified angina pectoris    New onset atrial fibrillation ([place]) 04/19/2010   Allergic rhinitis 04/19/2010   DYSPNEA 04/19/2010   CHEST PAIN, ATYPICAL 04/19/2010   CANCER, COLORECTAL 04/18/2010   Essential hypertension 04/18/2010   G E R D 04/18/2010   Fibromyalgia 04/18/2010   INSOMNIA 04/18/2010    Past Surgical  History:  Procedure Laterality Date   ABDOMINAL HYSTERECTOMY     ANKLE FRACTURE SURGERY     BACK SURGERY     BREAST SURGERY     COLON SURGERY     CORONARY STENT PLACEMENT     PERCUTANEOUS CORONARY STENT INTERVENTION (PCI-S) N/A 05/19/2013   Procedure: PERCUTANEOUS CORONARY STENT INTERVENTION (PCI-S);  Surgeon: Jettie Booze, MD;  Location: [facility] CATH LAB;  Service: Cardiovascular;  Laterality: N/A;     OB History   No obstetric history on file.     Family History  Problem Relation Age of Onset   CAD Brother     Heart attack Brother 63   Bone cancer Sister    Throat cancer Daughter     Social History   Tobacco Use   Smoking status: Former    Packs/day: 1.00    Years: 12.00    Pack years: 12.00    Types: Cigarettes    Start date: 10/28/1949    Quit date: 10/28/1962    Years since quitting: 58.9   Smokeless tobacco: Never  Vaping Use   Vaping Use: Never used  Substance Use Topics   Alcohol use: No   Drug use: No    Home Medications Prior to Admission medications   Medication Sig Start Date End Date Taking? Authorizing Provider  albuterol (PROVENTIL) (2.5 MG/3ML) 0.083% nebulizer solution USE 1 VIAL VIA NEBULIZER EVERY 6 HOURS AS NEEDED Patient taking differently: Take 2.5 mg by nebulization in the morning and at bedtime. 02/20/21  Yes Rigoberto Noel, MD  Albuterol Sulfate (PROAIR RESPICLICK) 528 (90 Base) MCG/ACT AEPB Inhale 1-2 puffs into the lungs every 6 (six) hours as needed (for shortness of breath and/or wheezing.). Patient taking differently: Inhale 1-2 puffs into the lungs in the morning and at bedtime. 10/31/20  Yes Rigoberto Noel, MD  ALPRAZolam Duanne Moron) 0.25 MG tablet Take 0.25 mg by mouth 3 (three) times daily as needed for anxiety.   Yes [provider]  apixaban (ELIQUIS) 5 MG TABS tablet Take 1 tablet (5 mg total) by mouth 2 (two) times daily. 09/07/21  Yes Jettie Booze, MD  Budeson-Glycopyrrol-Formoterol (BREZTRI AEROSPHERE) 160-9-4.8 MCG/ACT AERO Inhale 2 puffs into the lungs in the morning and at bedtime. 09/18/20  Yes Lauraine Rinne, NP  diltiazem (CARDIZEM CD) 120 MG 24 hr capsule Take 1 capsule (120 mg total) by mouth daily. 09/07/21   Jettie Booze, MD  DULoxetine (CYMBALTA) 60 MG capsule Take 60 mg by mouth daily. 09/25/17   [provider]  furosemide (LASIX) 40 MG tablet Take 0.5 tablets (20 mg total) by mouth daily. Check BMP follow-up with PCP before resuming 08/27/21   Antonieta Pert, MD  isosorbide mononitrate (IMDUR) 60 MG 24 hr tablet  TAKE 1 TABLET BY MOUTH  DAILY 07/20/21   Jettie Booze, MD  levothyroxine (SYNTHROID) 25 MCG tablet Take 25 mcg by mouth daily before breakfast.    [provider]  Nebulizers (COMPRESSOR NEBULIZER) MISC 1 Device by Does not apply route once. 04/04/16   Deneise Lever, MD  nitroGLYCERIN (NITROSTAT) 0.4 MG SL tablet Place 1 tablet (0.4 mg total) under the tongue every 5 (five) minutes as needed for chest pain. 09/07/21   Jettie Booze, MD  polyethylene glycol powder [facility]) 17 GM/SCOOP powder Take 17 g by mouth daily as needed for mild constipation.    [provider]  potassium chloride SA (KLOR-CON) 20 MEQ tablet Take 20 mEq by mouth daily.  08/31/21   [provider]  pregabalin (LYRICA) 50 MG capsule Take 50 mg by mouth 2 (two) times daily. 06/14/21   [provider]  psyllium (METAMUCIL) 58.6 % powder Take 1 packet by mouth daily.    [provider]  Respiratory Therapy Supplies (FLUTTER) DEVI Use flutter device 3 times a day 07/22/18   Martyn Ehrich, NP  rosuvastatin (CRESTOR) 20 MG tablet TAKE 1 TABLET BY MOUTH  DAILY 11/23/20   Jettie Booze, MD  senna (SENOKOT) 8.6 MG TABS tablet Take 2 tablets (17.2 mg total) by mouth at bedtime. 05/03/20   Raulkar, Clide Deutscher, MD    Allergies    Aspirin, Hydroxyquinolines, Imipramine hcl, Pamelor [nortriptyline hcl], Prednisone, Pregabalin, Procardia [nifedipine], Tramadol, Ace inhibitors, Hydroxyzine hcl, Influenza vaccines, Morphine and related, Oxycodone-acetaminophen, Gabapentin, and Plaquenil [hydroxychloroquine sulfate]  Review of Systems   Review of Systems  Cardiovascular:  Positive for chest pain.  All other systems reviewed and are negative.  Physical Exam Updated Vital Signs BP (!) 145/49 (BP Location: Right Arm)   Pulse 72   Resp 20   Ht 1.626 m (5\' 4" )   Wt 102.1 kg   SpO2 96%   BMI 38.62 kg/m   Physical Exam Vitals and nursing note reviewed.   Constitutional:      General: She is not in acute distress.    Appearance: She is well-developed.  HENT:     Head: Normocephalic and atraumatic.     Right Ear: External ear normal.     Left Ear: External ear normal.  Eyes:     General: No scleral icterus.       Right eye: No discharge.        Left eye: No discharge.     Conjunctiva/sclera: Conjunctivae normal.  Neck:     Trachea: No tracheal deviation.  Cardiovascular:     Rate and Rhythm: Tachycardia present. Rhythm irregular.  Pulmonary:     Effort: Pulmonary effort is normal. No respiratory distress.     Breath sounds: Normal breath sounds. No stridor. No wheezing or rales.  Abdominal:     General: Bowel sounds are normal. There is no distension.     Palpations: Abdomen is soft.     Tenderness: There is no abdominal tenderness. There is no guarding or rebound.  Musculoskeletal:        General: No tenderness or deformity.     Cervical back: Neck supple.  Skin:    General: Skin is warm and dry.     Findings: No rash.  Neurological:     General: No focal deficit present.     Mental Status: She is alert.     Cranial Nerves: No cranial nerve deficit (no facial droop, extraocular movements intact, no slurred speech).     Sensory: No sensory deficit.     Motor: No abnormal muscle tone or seizure activity.     Coordination: Coordination normal.  Psychiatric:        Mood and Affect: Mood normal.    ED Results / Procedures / Treatments   Labs (all labs ordered are listed, but only abnormal results are displayed) Labs Reviewed  BASIC METABOLIC PANEL - Abnormal; Notable for the following components:      Result Value   Glucose, Bld 110 (*)    Creatinine, Ser 1.04 (*)    GFR, Estimated 52 (*)    All other components within normal limits  CBG MONITORING, ED - Abnormal; Notable for the following components:  Glucose-Capillary 125 (*)    All other components within normal limits  TROPONIN I (HIGH SENSITIVITY) - Abnormal;  Notable for the following components:   Troponin I (High Sensitivity) 24 (*)    All other components within normal limits  CBC    EKG EKG Interpretation  Date/Time:  Monday September 24 2021 10:38:55 EST Ventricular Rate:  88 PR Interval:  154 QRS Duration: 78 QT Interval:  368 QTC Calculation: 446 R Axis:   75 Text Interpretation: Sinus rhythm Low voltage, precordial leads Confirmed by Dorie Rank [phone]) on 09/24/2021 12:05:57 PM  Radiology DG Chest 1 View  Result Date: 09/24/2021 CLINICAL DATA:  Chest pain. EXAM: CHEST  1 VIEW COMPARISON:  August 18, 2021. FINDINGS: The heart size and mediastinal contours are within normal limits. Both lungs are clear. The visualized skeletal structures are unremarkable. IMPRESSION: No active disease. Aortic Atherosclerosis (ICD10-I70.0). Electronically Signed   By: Marijo Conception M.D.   On: 09/24/2021 11:07    Procedures Procedures   Medications Ordered in ED Medications  diltiazem (CARDIZEM) 1 mg/mL load via infusion 10 mg (10 mg Intravenous Bolus from Bag 09/24/21 1118)    And  diltiazem (CARDIZEM) 125 mg in dextrose 5% 125 mL (1 mg/mL) infusion (5 mg/hr Intravenous New Bag/Given 09/24/21 1118)    ED Course  I have reviewed the triage vital signs and the nursing notes.  Pertinent labs & imaging results that were available during my care of the patient were reviewed by me and considered in my medical decision making (see chart for details).  Clinical Course as of 09/24/21 1435  Mon Sep 24, 2021  1046 Repeat  EKG shows patient is back in sinus rhythm at a rate of 88 [JK]  1302 DG Chest 1 View No acute findings [JK]  1357 Troponin slightly elevated at 29 but decreasing [JK]  1405 Patient continues to feel much improved.  Heart rate in the 70s.  She would prefer to go home. [WE]  3154 Office notes reviewed and the patient's daughter called the clinic because she cannot afford the co-pay for the anticoagulant.  While the patient is here  in the ED I will discuss options with cardiology [JK]  1427 Case discussed with Dr. Gasper Sells.  Cardiology pharmacy team will contact patient regarding alternatives to her Eliquis as an outpatient. [JK]    Clinical Course User Index [JK] Dorie Rank, MD   MDM Rules/Calculators/A&P     CHA2DS2-VASc Score: 4                     Patient presented to the ED with recurrent chest pain and tachycardia.  Patient was recently in the hospital for an episode of A. fib rapid ventricular rate.  Patient noticed chest pain this morning and when EMS arrived they noted she was in rapid A. fib again.  Patient was given a dose of Cardizem.  In the ED she was given additional Cardizem dose and ended up converting while she was in the ER.  EKG is now showing a sinus rhythm.  Patient's troponin is only slightly elevated at 24 and this is similar to her previous value.  Suspect this is related to her rapid A. fib and not related to ACS.  Patient is feeling well now.  She has no complaints.  She is comfortable with discharge.  I have discussed with cardiology and the patient is stable for discharge.  There is some issue with the patient's Eliquis prescription due to  the cost.  Cardiology pharmacy team will contact the patient regarding options. Final Clinical Impression(s) / ED Diagnoses Final diagnoses:  Atrial fibrillation, rapid [facility])    Rx / DC Orders ED Discharge Orders     None        Dorie Rank, MD 09/24/21 1436

## 2021-09-24 NOTE — Telephone Encounter (Signed)
Pt c/o of Chest Pain: STAT if CP now or developed within 24 hours  1. Are you having CP right now? no  2. Are you experiencing any other symptoms (ex. SOB, nausea, vomiting, sweating)? No   3. How long have you been experiencing CP? 3-4 days  4. Is your CP continuous or coming and going? Comes and goes  5. Have you taken Nitroglycerin? Yes  Patient states pain goes away after she takes the nitroglycerin, but a discomfort feel stays.  ?

## 2021-09-24 NOTE — ED Notes (Signed)
Patient transported to X-ray 

## 2021-09-24 NOTE — ED Notes (Signed)
Pt ambulated to bathroom, steady gait noted.

## 2021-09-24 NOTE — Discharge Instructions (Signed)
Schedule an appointment to follow-up with Dr. Irish Lack  The cardiology pharmacy team should contact you regarding alternatives to the Eliquis.

## 2021-09-24 NOTE — ED Triage Notes (Signed)
Patient presents to ed via [facility] states she was asleep and was awaken by chest pain and sob. Patient has a history of A-fib. Was seen 2 weeks ago for same. Patient isn't able to take asa, states she took 1 ntg. Patient was given Cardizem 10 mg IV per ems. Patient is alert and oriented. No pain at present

## 2021-09-24 NOTE — Telephone Encounter (Signed)
Left message to call office

## 2021-09-25 DIAGNOSIS — J9611 Chronic respiratory failure with hypoxia: Secondary | ICD-10-CM | POA: Diagnosis not present

## 2021-09-25 NOTE — Telephone Encounter (Signed)
**Note De-Identified  Obfuscation** Already addressed. See other phone note from 11/28.

## 2021-09-27 ENCOUNTER — Other Ambulatory Visit: Payer: Self-pay | Admitting: Interventional Cardiology

## 2021-10-02 ENCOUNTER — Other Ambulatory Visit: Payer: Self-pay | Admitting: *Deleted

## 2021-10-02 NOTE — Patient Outreach (Signed)
[facility]) Care Management [place] Note   10/02/2021 Name:  Sheryl Curtis MRN:  [id number] DOB:  [birth year]  Summary: Patient has not been monitoring blood pressure at home. Patient is monitoring sodium in diet.  Patient is not exercising. Per patient she has been recently diagnosed with Atrial Fib. Patient has started on Eliquis.    Recommendations/Changes made from today's visit: Monitor blood pressure once a week Educate patient on Atrial Fibrillation   Subjective: Sheryl Curtis is an 87 y.o. year old female who is a primary patient of Collene Leyden, MD. The care management team was consulted for assistance with care management and/or care coordination needs.    RN Health Coach completed Telephone Visit today.   Objective:  Medications Reviewed Today     Reviewed by Verlin Grills, RN (Case Manager) on 10/02/21 at 1417  Med List Status: <None>   Medication Order Taking? Sig Documenting Provider Last Dose Status Informant  albuterol (PROVENTIL) (2.5 MG/3ML) 0.083% nebulizer solution [id number] No USE 1 VIAL VIA NEBULIZER EVERY 6 HOURS AS NEEDED  Patient taking differently: Take 2.5 mg by nebulization in the morning and at bedtime.   Rigoberto Noel, MD 09/23/2021 Active   Albuterol Sulfate (PROAIR RESPICLICK) 884 (90 Base) MCG/ACT AEPB [id number] No Inhale 1-2 puffs into the lungs every 6 (six) hours as needed (for shortness of breath and/or wheezing.).  Patient taking differently: Inhale 1-2 puffs into the lungs in the morning and at bedtime.   Rigoberto Noel, MD 09/23/2021 Active Multiple Informants  ALPRAZolam (XANAX) 0.25 MG tablet [id number] No Take 0.25 mg by mouth 3 (three) times daily as needed for anxiety. [provider] Past Month Active Multiple Informants  apixaban (ELIQUIS) 5 MG TABS tablet [id number]  Take 1 tablet (5 mg total) by mouth 2 (two) times daily. Jettie Booze, MD  Active   Budeson-Glycopyrrol-Formoterol (BREZTRI  AEROSPHERE) 160-9-4.8 MCG/ACT Hollie Salk [id number] No Inhale 2 puffs into the lungs in the morning and at bedtime. Lauraine Rinne, NP 09/23/2021 Active Multiple Informants  diltiazem (CARDIZEM CD) 120 MG 24 hr capsule [id number]  Take 1 capsule (120 mg total) by mouth daily. Jettie Booze, MD  Active   DULoxetine (CYMBALTA) 60 MG capsule [id number] No Take 60 mg by mouth daily. [provider] Taking Active Multiple Informants  furosemide (LASIX) 40 MG tablet [id number] No Take 0.5 tablets (20 mg total) by mouth daily. Check BMP follow-up with PCP before resuming Kc, Ramesh, MD Taking Active   isosorbide mononitrate (IMDUR) 60 MG 24 hr tablet [id number]  TAKE 1 TABLET BY MOUTH  DAILY Jettie Booze, MD  Active   levothyroxine (SYNTHROID) 25 MCG tablet [id number] No Take 25 mcg by mouth daily before breakfast. [provider] Taking Active   Nebulizers (COMPRESSOR NEBULIZER) MISC [id number] No 1 Device by Does not apply route once. Deneise Lever, MD Taking Active Multiple Informants  nitroGLYCERIN (NITROSTAT) 0.4 MG SL tablet [id number]  Place 1 tablet (0.4 mg total) under the tongue every 5 (five) minutes as needed for chest pain. Jettie Booze, MD  Active   polyethylene glycol powder [facility]) 17 GM/SCOOP powder [id number] No Take 17 g by mouth daily as needed for mild constipation. [provider] Taking Active Multiple Informants  potassium chloride SA (KLOR-CON) 20 MEQ tablet [id number]  Take 20 mEq by mouth daily. [provider]  Active   pregabalin (LYRICA) 50 MG capsule [id number]  Take 50 mg by mouth 2 (  two) times daily. [provider]  Active   psyllium (METAMUCIL) 58.6 % powder [id number] No Take 1 packet by mouth daily. [provider] Taking Active Multiple Informants  Respiratory Therapy Supplies (FLUTTER) DEVI [id number] No Use flutter device 3 times a day Martyn Ehrich, NP Taking Active Multiple Informants   rosuvastatin (CRESTOR) 20 MG tablet [id number] No TAKE 1 TABLET BY MOUTH  DAILY Jettie Booze, MD Taking Active   senna (SENOKOT) 8.6 MG TABS tablet [id number] No Take 2 tablets (17.2 mg total) by mouth at bedtime. Izora Ribas, MD Taking Active              SDOH:  (Social Determinants of Health) assessments and interventions performed:  SDOH Interventions    Flowsheet Row Most Recent Value  SDOH Interventions   Food Insecurity Interventions Intervention Not Indicated  Housing Interventions Intervention Not Indicated  Intimate Partner Violence Interventions Intervention Not Indicated  Transportation Interventions Intervention Not Indicated       Care Plan  Review of patient past medical history, allergies, medications, health status, including review of consultants reports, laboratory and other test data, was performed as part of comprehensive evaluation for care management services.   Care Plan : Hypertension (Adult)  Updates made by Verlin Grills, RN since 10/02/2021 12:00 AM  Completed 10/02/2021   Problem: Hypertension (Hypertension) Resolved 10/02/2021  Priority: Medium  Onset Date: 10/16/2020  Note:   Resolving due to duplicate goal     Goal: Hypertension Monitored Completed 10/02/2021  Start Date: 10/16/2020  Expected End Date: 10/26/2021  Recent Progress: Not on track  Priority: High  Note:   Evidence-based guidance:  Promote initial use of ambulatory blood pressure measurements (for 3 days) to rule out "white-coat" effect; identify masked hypertension and presence or absence of nocturnal "dipping" of blood pressure.   Encourage continued use of home blood pressure monitoring and recording in blood pressure log; include symptoms of hypotension or potential medication side effects in log.  Review blood pressure measurements taken inside and outside of the provider office; establish baseline and monitor trends; compare to target ranges or patient  goal.  Share overall cardiovascular risk with patient; encourage changes to lifestyle risk factors, including alcohol consumption, smoking, inadequate exercise, poor dietary habits and stress.   Notes:  [id number] Patient has not been monitoring her blood pressure    Task: Identify and Monitor Blood Pressure Elevation Completed 10/02/2021  Due Date: 10/26/2021  Note:   Care Management Activities:    - home or ambulatory blood pressure monitoring encouraged    Notes:  [id number] Patient has not been monitoring Bp at home    Problem: Disease Progression (Hypertension) Resolved 10/02/2021  Priority: Medium  Onset Date: 10/16/2020  Note:   Resolving due to duplicate goal     Long-Range Goal: Disease Progression Prevented or Minimized Completed 10/02/2021  Start Date: 10/16/2020  Expected End Date: 10/26/2021  Recent Progress: On track  Priority: Medium  Note:   Evidence-based guidance:  Tailor lifestyle advice to individual; review progress regularly; give frequent encouragement and respond positively to incremental successes.  Assess for and promote awareness of worsening disease or development of comorbidity.  Prepare patient for laboratory and diagnostic exams based on risk and presentation.  Prepare patient for use of pharmacologic therapy that may include diuretic, beta-blocker, beta-blocker/thiazide combination, angiotensin-converting enzyme inhibitor, renin-angiotensin blocker or calcium-channel blocker.  Expect periodic adjustments to pharmacologic therapy; manage side effects.  Promote a healthy diet that includes primarily plant-based foods,  such as fruits, vegetables, whole grains, beans and legumes, low-fat dairy and lean meats.   Consider moderate reduction in sodium intake by avoiding the addition of salt to prepared foods and limiting processed meats, canned soup, frozen meals and salty snacks.   Promote a regular, daily exercise goal of 150 minutes per week of moderate  exercise based on tolerance, ability and patient choice; consider referral to physical therapist, community wellness and/or activity program.  Encourage the avoidance of no more than 2 hours per day of sedentary activity, such as recreational screen time.  Review sources of stress; explore current coping strategies and encourage use of mindfulness, yoga, meditation or exercise to manage stress.   Notes:  [id number] Patient is having a lot of sciatica pain. With the pain her blood pressure fluctuates    Task: Alleviate Barriers to Hypertension Treatment Completed 10/02/2021  Due Date: 10/26/2021  Note:   Care Management Activities:    - complementary therapy use encouraged - counseling by pharmacist provided - healthy diet promoted - medical nutrition therapy provided - patient response to treatment assessed - reduction of dietary sodium encouraged - reduction in sedentary activities encouraged    Notes:     Problem: Resistant Hypertension (Hypertension) Resolved 10/02/2021  Priority: Medium  Onset Date: 10/16/2020  Note:   Resolving due to duplicate goal     Long-Range Goal: Response to Treatment Maximized Completed 10/02/2021  Start Date: 10/16/2020  Expected End Date: 10/26/2021  Recent Progress: On track  Priority: Medium  Note:   Evidence-based guidance:  Assess patient response to treatment, including presence or absence of medication side effects, degree of blood pressure control and patient satisfaction.  Assess technique (including cuff size and placement), measurement times, condition and calibration of blood pressure cuff set (both at-home and in-office equipment).  Assess factors that may influence response to treatment, including nonadherence to pharmacologic treatment plan, diet or activity changes and/or presence of pain, stress or sleep disturbance.  Screen for signs and symptoms of depression; if present, refer for or complete a comprehensive assessment.  Evaluate  social and economic barriers that may affect adherence to treatment plan  Address pharmacologic nonadherence by simplifying dosing regimen, counseling or support by pharmacist, financial assistance, self-monitoring of blood pressure, use of motivational interviewing, voice or text messages.  Encourage behavioral adherence strategies, like habit-based interventions that link medication taking with existing daily routines.  Assess barriers to regular, daily physical activity; support family or support person-oriented activity changes and utilization of community activity or sports program.  Address barriers to dietary changes, especially sodium restriction, with referrals to community programs, like cooking classes, meal services or intensive education when available.  Refer to community-based peer support program or nurse home-visiting program.  Assess for chronic pain; when present add additional goals (Chronic Pain Care Plan Guide) as needed.  Provide frequent follow-up by telephone, telemonitoring, patient-practice portal or with home visit.  Review alcohol use screen; address using brief intervention beginning with risk that interferes with blood pressure control; refer for treatment when excessive alcohol use is noted.  Screen for obstructive sleep apnea; prepare patient for polysomnography based on risk and presentation and use of noninvasive ventilation to relieve obstructive sleep apnea when present.   Notes:  [id number] Patient blood pressure is controlled unless in pain.    Task: Facilitate Adherence to Lifestyle Change Completed 10/02/2021  Due Date: 10/26/2021  Note:   Care Management Activities:    - home blood pressure monitoring equipment use demonstrated - medication  adherence assessment completed - resources needed to manage barriers to lifestyle change identified - success praised - support and encouragement provided    Notes:  RN sent picture sheet on how to take blood  pressure [id number] Patient has not been monitoring her blood pressure at home    Care Plan : [place] of Care  Updates made by Cassie Henkels, Eppie Gibson, RN since 10/02/2021 12:00 AM     Problem: Knowledge Deficit Related to Hypertension ,  Atrial Fibrillation and Care Coordination needs   Priority: High     Long-Range Goal: Development Plan of Care for Management of Hypertension and Atrial Fibrillation   Start Date: 10/02/2021  Expected End Date: 10/02/2022  Priority: High  Note:   Current Barriers:  Knowledge Deficits related to plan of care for management of Atrial Fibrillation and HTN   RNCM Clinical Goal(s):  Patient will verbalize understanding of plan for management of Atrial Fibrillation and HTN as evidenced by continuation of monitoring blood pressure , heart rhythm adhering to low sodium diet and medication adherence. attend all scheduled medical appointments: Blood pressure and heart rhythm  as evidenced by regulation        through collaboration with RN Care manager, provider, and care team.   Interventions: Inter-disciplinary care team collaboration (see longitudinal plan of care) Evaluation of current treatment plan related to  self management and patient's adherence to plan as established by provider  Patient Goals/Self-Care Activities: Take medications as prescribed   Attend all scheduled provider appointments Call pharmacy for medication refills 3-7 days in advance of running out of medications Attend church or other social activities Perform all self care activities independently  Perform IADL's (shopping, preparing meals, housekeeping, managing finances) independently Call provider office for new concerns or questions  - check pulse (heart) rate before taking medicine - check pulse (heart) rate once a day - make a plan to eat healthy - keep all lab appointments - take medicine as prescribed check blood pressure weekly take blood pressure log to all  doctor appointments call doctor for signs and symptoms of high blood pressure keep all doctor appointments take medications for blood pressure exactly as prescribed report new symptoms to your doctor limit salt intake to 2300 mg/day        Plan:  Telephone follow up appointment with care management team member scheduled for:  March 2023 The patient has been provided with contact information for the care management team and has been advised to call with any health related questions or concerns.  Follow up with provider re: Atrial Fibrillation for any symptoms RN provided Atrial Fibrillation booklet F.A.S.T. acronym sheet for stroke education  [facility] [phone]

## 2021-10-02 NOTE — Patient Instructions (Signed)
Visit Information  Thank you for taking time to visit with me today. Please don't hesitate to contact me if I can be of assistance to you before our next scheduled telephone appointment.  Current Barriers:  Knowledge Deficits related to plan of care for management of Atrial Fibrillation and HTN   RNCM Clinical Goal(s):  Patient will verbalize understanding of plan for management of Atrial Fibrillation and HTN as evidenced by continuation of monitoring blood pressure , heart rhythm adhering to low sodium diet and medication adherence. attend all scheduled medical appointments: Blood pressure and heart rhythm  as evidenced by regulation        through collaboration with RN Care manager, provider, and care team.   Interventions: Inter-disciplinary care team collaboration (see longitudinal plan of care) Evaluation of current treatment plan related to  self management and patient's adherence to plan as established by provider  Patient Goals/Self-Care Activities: Take medications as prescribed   Attend all scheduled provider appointments Call pharmacy for medication refills 3-7 days in advance of running out of medications Attend church or other social activities Perform all self care activities independently  Perform IADL's (shopping, preparing meals, housekeeping, managing finances) independently Call provider office for new concerns or questions  - check pulse (heart) rate before taking medicine - check pulse (heart) rate once a day - make a plan to eat healthy - keep all lab appointments - take medicine as prescribed check blood pressure weekly take blood pressure log to all doctor appointments call doctor for signs and symptoms of high blood pressure keep all doctor appointments take medications for blood pressure exactly as prescribed report new symptoms to your doctor limit salt intake to 2300 mg/day    Our next appointment is by telephone on March 2023  Please call the care  guide team at [phone] if you need to cancel or reschedule your appointment.   Please call the Suicide and Crisis Lifeline: 988 if you are experiencing a Mental Health or [place] or need someone to talk to.  Following is a copy of your care plan:  Care Plan : Hypertension (Adult)  Updates made by Verlin Grills, RN since 10/02/2021 12:00 AM  Completed 10/02/2021   Problem: Hypertension (Hypertension) Resolved 10/02/2021  Priority: Medium  Onset Date: 10/16/2020  Note:   Resolving due to duplicate goal     Goal: Hypertension Monitored Completed 10/02/2021  Start Date: 10/16/2020  Expected End Date: 10/26/2021  Recent Progress: Not on track  Priority: High  Note:   Evidence-based guidance:  Promote initial use of ambulatory blood pressure measurements (for 3 days) to rule out "white-coat" effect; identify masked hypertension and presence or absence of nocturnal "dipping" of blood pressure.   Encourage continued use of home blood pressure monitoring and recording in blood pressure log; include symptoms of hypotension or potential medication side effects in log.  Review blood pressure measurements taken inside and outside of the provider office; establish baseline and monitor trends; compare to target ranges or patient goal.  Share overall cardiovascular risk with patient; encourage changes to lifestyle risk factors, including alcohol consumption, smoking, inadequate exercise, poor dietary habits and stress.   Notes:  [id number] Patient has not been monitoring her blood pressure    Task: Identify and Monitor Blood Pressure Elevation Completed 10/02/2021  Due Date: 10/26/2021  Note:   Care Management Activities:    - home or ambulatory blood pressure monitoring encouraged    Notes:  [id number] Patient has not been monitoring Bp  at home    Problem: Disease Progression (Hypertension) Resolved 10/02/2021  Priority: Medium  Onset Date: 10/16/2020  Note:    Resolving due to duplicate goal     Long-Range Goal: Disease Progression Prevented or Minimized Completed 10/02/2021  Start Date: 10/16/2020  Expected End Date: 10/26/2021  Recent Progress: On track  Priority: Medium  Note:   Evidence-based guidance:  Tailor lifestyle advice to individual; review progress regularly; give frequent encouragement and respond positively to incremental successes.  Assess for and promote awareness of worsening disease or development of comorbidity.  Prepare patient for laboratory and diagnostic exams based on risk and presentation.  Prepare patient for use of pharmacologic therapy that may include diuretic, beta-blocker, beta-blocker/thiazide combination, angiotensin-converting enzyme inhibitor, renin-angiotensin blocker or calcium-channel blocker.  Expect periodic adjustments to pharmacologic therapy; manage side effects.  Promote a healthy diet that includes primarily plant-based foods, such as fruits, vegetables, whole grains, beans and legumes, low-fat dairy and lean meats.   Consider moderate reduction in sodium intake by avoiding the addition of salt to prepared foods and limiting processed meats, canned soup, frozen meals and salty snacks.   Promote a regular, daily exercise goal of 150 minutes per week of moderate exercise based on tolerance, ability and patient choice; consider referral to physical therapist, community wellness and/or activity program.  Encourage the avoidance of no more than 2 hours per day of sedentary activity, such as recreational screen time.  Review sources of stress; explore current coping strategies and encourage use of mindfulness, yoga, meditation or exercise to manage stress.   Notes:  [id number] Patient is having a lot of sciatica pain. With the pain her blood pressure fluctuates    Task: Alleviate Barriers to Hypertension Treatment Completed 10/02/2021  Due Date: 10/26/2021  Note:   Care Management Activities:    -  complementary therapy use encouraged - counseling by pharmacist provided - healthy diet promoted - medical nutrition therapy provided - patient response to treatment assessed - reduction of dietary sodium encouraged - reduction in sedentary activities encouraged    Notes:     Problem: Resistant Hypertension (Hypertension) Resolved 10/02/2021  Priority: Medium  Onset Date: 10/16/2020  Note:   Resolving due to duplicate goal     Long-Range Goal: Response to Treatment Maximized Completed 10/02/2021  Start Date: 10/16/2020  Expected End Date: 10/26/2021  Recent Progress: On track  Priority: Medium  Note:   Evidence-based guidance:  Assess patient response to treatment, including presence or absence of medication side effects, degree of blood pressure control and patient satisfaction.  Assess technique (including cuff size and placement), measurement times, condition and calibration of blood pressure cuff set (both at-home and in-office equipment).  Assess factors that may influence response to treatment, including nonadherence to pharmacologic treatment plan, diet or activity changes and/or presence of pain, stress or sleep disturbance.  Screen for signs and symptoms of depression; if present, refer for or complete a comprehensive assessment.  Evaluate social and economic barriers that may affect adherence to treatment plan  Address pharmacologic nonadherence by simplifying dosing regimen, counseling or support by pharmacist, financial assistance, self-monitoring of blood pressure, use of motivational interviewing, voice or text messages.  Encourage behavioral adherence strategies, like habit-based interventions that link medication taking with existing daily routines.  Assess barriers to regular, daily physical activity; support family or support person-oriented activity changes and utilization of community activity or sports program.  Address barriers to dietary changes, especially  sodium restriction, with referrals to community programs, like cooking  classes, meal services or intensive education when available.  Refer to community-based peer support program or nurse home-visiting program.  Assess for chronic pain; when present add additional goals (Chronic Pain Care Plan Guide) as needed.  Provide frequent follow-up by telephone, telemonitoring, patient-practice portal or with home visit.  Review alcohol use screen; address using brief intervention beginning with risk that interferes with blood pressure control; refer for treatment when excessive alcohol use is noted.  Screen for obstructive sleep apnea; prepare patient for polysomnography based on risk and presentation and use of noninvasive ventilation to relieve obstructive sleep apnea when present.   Notes:  [id number] Patient blood pressure is controlled unless in pain.    Task: Facilitate Adherence to Lifestyle Change Completed 10/02/2021  Due Date: 10/26/2021  Note:   Care Management Activities:    - home blood pressure monitoring equipment use demonstrated - medication adherence assessment completed - resources needed to manage barriers to lifestyle change identified - success praised - support and encouragement provided    Notes:  RN sent picture sheet on how to take blood pressure [id number] Patient has not been monitoring her blood pressure at home    Care Plan : [place] of Care  Updates made by Nichalos Brenton, Eppie Gibson, RN since 10/02/2021 12:00 AM     Problem: Knowledge Deficit Related to Hypertension ,  Atrial Fibrillation and Care Coordination needs   Priority: High     Long-Range Goal: Development Plan of Care for Management of Hypertension and Atrial Fibrillation   Start Date: 10/02/2021  Expected End Date: 10/02/2022  Priority: High  Note:   Current Barriers:  Knowledge Deficits related to plan of care for management of Atrial Fibrillation and HTN   RNCM Clinical Goal(s):  Patient  will verbalize understanding of plan for management of Atrial Fibrillation and HTN as evidenced by continuation of monitoring blood pressure , heart rhythm adhering to low sodium diet and medication adherence. attend all scheduled medical appointments: Blood pressure and heart rhythm  as evidenced by regulation        through collaboration with RN Care manager, provider, and care team.   Interventions: Inter-disciplinary care team collaboration (see longitudinal plan of care) Evaluation of current treatment plan related to  self management and patient's adherence to plan as established by provider  Patient Goals/Self-Care Activities: Take medications as prescribed   Attend all scheduled provider appointments Call pharmacy for medication refills 3-7 days in advance of running out of medications Attend church or other social activities Perform all self care activities independently  Perform IADL's (shopping, preparing meals, housekeeping, managing finances) independently Call provider office for new concerns or questions  - check pulse (heart) rate before taking medicine - check pulse (heart) rate once a day - make a plan to eat healthy - keep all lab appointments - take medicine as prescribed check blood pressure weekly take blood pressure log to all doctor appointments call doctor for signs and symptoms of high blood pressure keep all doctor appointments take medications for blood pressure exactly as prescribed report new symptoms to your doctor limit salt intake to 2300 mg/day       The patient verbalized understanding of instructions, educational materials, and care plan provided today and agreed to receive a mailed copy of patient instructions, educational materials, and care plan.   Telephone follow up appointment with care management team member scheduled for: The patient has been provided with contact information for the care management team and has been advised  to call with  any health related questions or concerns.   Signature: [facility] [phone]

## 2021-10-12 DIAGNOSIS — M898X1 Other specified disorders of bone, shoulder: Secondary | ICD-10-CM | POA: Diagnosis not present

## 2021-10-16 ENCOUNTER — Telehealth: Payer: Self-pay | Admitting: Interventional Cardiology

## 2021-10-16 NOTE — Telephone Encounter (Signed)
Patient had her daughter call about her chest pain. Patient complaining of chest pain that is relieved after taking one nitro. Patient is taking nitro almost every other day. Made patient an appointment with DOD, Dr. Marlou Porch this Friday. Encouraged patient to go to ED if symptoms get worse.

## 2021-10-16 NOTE — Telephone Encounter (Signed)
Pt c/o of Chest Pain: STAT if CP now or developed within 24 hours  1. Are you having CP right now? Yes   2. Are you experiencing any other symptoms (ex. SOB, nausea, vomiting, sweating)? No  3. How long have you been experiencing CP? A month ago  4. Is your CP continuous or coming and going? Coming and going  5. Have you taken Nitroglycerin? Took one last night ?

## 2021-10-18 DIAGNOSIS — R030 Elevated blood-pressure reading, without diagnosis of hypertension: Secondary | ICD-10-CM | POA: Diagnosis not present

## 2021-10-18 DIAGNOSIS — J449 Chronic obstructive pulmonary disease, unspecified: Secondary | ICD-10-CM | POA: Diagnosis not present

## 2021-10-18 DIAGNOSIS — Z9981 Dependence on supplemental oxygen: Secondary | ICD-10-CM | POA: Diagnosis not present

## 2021-10-18 DIAGNOSIS — M898X1 Other specified disorders of bone, shoulder: Secondary | ICD-10-CM | POA: Diagnosis not present

## 2021-10-18 DIAGNOSIS — N189 Chronic kidney disease, unspecified: Secondary | ICD-10-CM | POA: Diagnosis not present

## 2021-10-18 DIAGNOSIS — I2510 Atherosclerotic heart disease of native coronary artery without angina pectoris: Secondary | ICD-10-CM | POA: Diagnosis not present

## 2021-10-18 DIAGNOSIS — M25511 Pain in right shoulder: Secondary | ICD-10-CM | POA: Diagnosis not present

## 2021-10-18 DIAGNOSIS — Z7901 Long term (current) use of anticoagulants: Secondary | ICD-10-CM | POA: Diagnosis not present

## 2021-10-18 DIAGNOSIS — Z87891 Personal history of nicotine dependence: Secondary | ICD-10-CM | POA: Diagnosis not present

## 2021-10-19 ENCOUNTER — Ambulatory Visit: Payer: Medicare Other | Admitting: Cardiology

## 2021-10-23 DIAGNOSIS — Z9981 Dependence on supplemental oxygen: Secondary | ICD-10-CM | POA: Diagnosis not present

## 2021-10-23 DIAGNOSIS — J449 Chronic obstructive pulmonary disease, unspecified: Secondary | ICD-10-CM | POA: Diagnosis not present

## 2021-10-23 DIAGNOSIS — Z87891 Personal history of nicotine dependence: Secondary | ICD-10-CM | POA: Diagnosis not present

## 2021-10-23 DIAGNOSIS — Z7901 Long term (current) use of anticoagulants: Secondary | ICD-10-CM | POA: Diagnosis not present

## 2021-10-23 DIAGNOSIS — N189 Chronic kidney disease, unspecified: Secondary | ICD-10-CM | POA: Diagnosis not present

## 2021-10-23 DIAGNOSIS — M898X1 Other specified disorders of bone, shoulder: Secondary | ICD-10-CM | POA: Diagnosis not present

## 2021-10-23 DIAGNOSIS — M25511 Pain in right shoulder: Secondary | ICD-10-CM | POA: Diagnosis not present

## 2021-10-23 DIAGNOSIS — I2510 Atherosclerotic heart disease of native coronary artery without angina pectoris: Secondary | ICD-10-CM | POA: Diagnosis not present

## 2021-10-23 DIAGNOSIS — R030 Elevated blood-pressure reading, without diagnosis of hypertension: Secondary | ICD-10-CM | POA: Diagnosis not present

## 2021-10-25 ENCOUNTER — Other Ambulatory Visit: Payer: Self-pay | Admitting: Interventional Cardiology

## 2021-10-25 DIAGNOSIS — J9611 Chronic respiratory failure with hypoxia: Secondary | ICD-10-CM | POA: Diagnosis not present

## 2021-10-30 ENCOUNTER — Encounter: Payer: Self-pay | Admitting: Pulmonary Disease

## 2021-10-30 ENCOUNTER — Ambulatory Visit (INDEPENDENT_AMBULATORY_CARE_PROVIDER_SITE_OTHER): Payer: Medicare Other | Admitting: Pulmonary Disease

## 2021-10-30 ENCOUNTER — Other Ambulatory Visit: Payer: Self-pay

## 2021-10-30 DIAGNOSIS — J4530 Mild persistent asthma, uncomplicated: Secondary | ICD-10-CM

## 2021-10-30 DIAGNOSIS — J9611 Chronic respiratory failure with hypoxia: Secondary | ICD-10-CM

## 2021-10-30 DIAGNOSIS — R918 Other nonspecific abnormal finding of lung field: Secondary | ICD-10-CM

## 2021-10-30 DIAGNOSIS — I5032 Chronic diastolic (congestive) heart failure: Secondary | ICD-10-CM

## 2021-10-30 MED ORDER — PREDNISONE 10 MG PO TABS
ORAL_TABLET | ORAL | 0 refills | Status: AC
Start: 2021-10-30 — End: 2021-11-15

## 2021-10-30 NOTE — Assessment & Plan Note (Signed)
Not sure what is the etiology of her hypoxia.  She does not appear to be in overt heart failure and weight has not increased significantly.  Echo most recently does not show any evidence of pulmonary hypertension although there is some evidence of pulmonary hypertension on CT. she does not seem to have ongoing bronchospasm I have asked her to use oxygen especially during activity.  She is trying home physical therapy.  We will try to get her a portable oxygen concentrator.  Unfortunately I doubt that she is able to participate in a cardiac or pulmonary rehab program

## 2021-10-30 NOTE — Progress Notes (Signed)
° °  Subjective:    Patient ID: Sheryl Curtis, female    DOB: [birth year], 88 y.o.   MRN: [id number]  HPI   88yo remote smoker with severe RA,for FU of  airway Obstruction & OSA She has severe rheumatoid arthritis with previous MTX use and fibromyalgia CT chest has shown multiple pulmonary nodules that have been stable, last 2018   PMH - rectal cancer status post resection in 2007, stent to LAD in 04/2013. on Lasix daily for bipedal edema  Chief Complaint  Patient presents with   Follow-up    Follow up from 01/18/21. Pt states that her breathing is not ay better and that it wears her out to get up and walk or try to do anything    Last office visit 12/2020 -oxygen saturation was 88% on room air and we set her up on home oxygen She arrives in a wheelchair accompanied by her daughter who corroborates history. She continues to have right-sided sciatica which limits her movement.  She uses oxygen when she sleeps but not when she is active and continues to complain of significant shortness of breath. Weight is steady at 224 pounds over the past year and she continues to have bipedal edema.  Her Lasix was increased to 40 mg and this makes her go to the bathroom a lot and is very inconvenient. She feels that Judithann Sauger has helped her the best, previously she has tried discuss inhalers  We reviewed CT scans   Significant tests/ events reviewed  HRCT 12/2020 no ILD, minimal increase in size of multiple pulm nodules  CT chest 03/2017 -stable multiple pulmonary nodules since 2015   Spirometry in 05/2010 showed FEV1 of 76% improvement postbronchodilator to 80%-1.60 with a ratio of 75 suggesting mild restriction. Spirometry 03/2014 FEV1 of 1.25-64%, with ratio 65 and FVC of 1.91-72%.   PFTs - 12/2014 - no airway obstruction, ratio 71, pos BD response PFTs 11/2018, and mild airway obstruction with FEV1 79%, TLC 101%, DLCO normal, no bronchodilator response   06/2015 Admit for UTI and Sepsis     NPSG >>  AHI 10/h corrected by CPAP 10 cm    Review of Systems neg for any significant sore throat, dysphagia, itching, sneezing, nasal congestion or excess/ purulent secretions, fever, chills, sweats, unintended wt loss, pleuritic or exertional cp, hempoptysis, orthopnea pnd or change in chronic leg swelling. Also denies presyncope, palpitations, heartburn, abdominal pain, nausea, vomiting, diarrhea or change in bowel or urinary habits, dysuria,hematuria, rash, arthralgias, visual complaints, headache, numbness weakness or ataxia.     Objective:   Physical Exam  Gen. Pleasant, elderly,obese, in no distress, in wheelchair ENT - no lesions, no post nasal drip Neck: No JVD, no thyromegaly, no carotid bruits Lungs: no use of accessory muscles, no dullness to percussion, decreased without rales or rhonchi  Cardiovascular: Rhythm regular, heart sounds  normal, no murmurs or gallops, 1+ peripheral edema Musculoskeletal: No deformities, no cyanosis or clubbing , no tremors       Assessment & Plan:

## 2021-10-30 NOTE — Assessment & Plan Note (Signed)
Since she has significant nocturia she would like to reduce Lasix to 20 mg daily, half tablet and that is okay with me as long as she continues to monitor her weight and aim for weight below 225 pounds

## 2021-10-30 NOTE — Assessment & Plan Note (Signed)
She has been maintained on Breztri with seems to improve the best and she will continue. We will trial prednisone for short course to see if this has any improvement

## 2021-10-30 NOTE — Assessment & Plan Note (Signed)
Minimal increase in size of nodules largest being in the right lower lobe differential includes sarcoidosis, no significant ILD We will repeat CT chest without contrast in March 2023

## 2021-10-30 NOTE — Patient Instructions (Signed)
X Rx for POC   X Prednisone 10 mg tabs Take 4 tabs  daily with food x 4 days, then 3 tabs daily x 4 days, then 2 tabs daily x 4 days, then 1 tab daily x4 days then stop. #40  X ok for 1/2 tab diuretic - 20 mg daily , monitor weight , goal less than 225

## 2021-11-01 DIAGNOSIS — J449 Chronic obstructive pulmonary disease, unspecified: Secondary | ICD-10-CM | POA: Diagnosis not present

## 2021-11-01 DIAGNOSIS — Z9981 Dependence on supplemental oxygen: Secondary | ICD-10-CM | POA: Diagnosis not present

## 2021-11-01 DIAGNOSIS — M898X1 Other specified disorders of bone, shoulder: Secondary | ICD-10-CM | POA: Diagnosis not present

## 2021-11-01 DIAGNOSIS — Z87891 Personal history of nicotine dependence: Secondary | ICD-10-CM | POA: Diagnosis not present

## 2021-11-01 DIAGNOSIS — Z7901 Long term (current) use of anticoagulants: Secondary | ICD-10-CM | POA: Diagnosis not present

## 2021-11-01 DIAGNOSIS — N189 Chronic kidney disease, unspecified: Secondary | ICD-10-CM | POA: Diagnosis not present

## 2021-11-01 DIAGNOSIS — M25511 Pain in right shoulder: Secondary | ICD-10-CM | POA: Diagnosis not present

## 2021-11-01 DIAGNOSIS — R030 Elevated blood-pressure reading, without diagnosis of hypertension: Secondary | ICD-10-CM | POA: Diagnosis not present

## 2021-11-01 DIAGNOSIS — I2510 Atherosclerotic heart disease of native coronary artery without angina pectoris: Secondary | ICD-10-CM | POA: Diagnosis not present

## 2021-11-05 ENCOUNTER — Telehealth: Payer: Self-pay | Admitting: Pharmacy Technician

## 2021-11-05 DIAGNOSIS — Z596 Low income: Secondary | ICD-10-CM

## 2021-11-05 NOTE — Progress Notes (Signed)
[facility])                                            [place] Team    11/05/2021  Sheryl Curtis February 12, 1932 [id number]  Care coordination call placed to [employer] in regard to [facility] application.  Spoke to [place] who informs patient is APPROVED 10/28/21-10/27/22. She informs a new rx is on file and that next refill will process based on last fill date in 2022 automatically. She informs once processed the medication will be delivered to the patient's home.  Jakira Mcfadden P. Garcia [place], [place]  [phone]

## 2021-11-08 DIAGNOSIS — N189 Chronic kidney disease, unspecified: Secondary | ICD-10-CM | POA: Diagnosis not present

## 2021-11-08 DIAGNOSIS — I2510 Atherosclerotic heart disease of native coronary artery without angina pectoris: Secondary | ICD-10-CM | POA: Diagnosis not present

## 2021-11-08 DIAGNOSIS — Z87891 Personal history of nicotine dependence: Secondary | ICD-10-CM | POA: Diagnosis not present

## 2021-11-08 DIAGNOSIS — Z7901 Long term (current) use of anticoagulants: Secondary | ICD-10-CM | POA: Diagnosis not present

## 2021-11-08 DIAGNOSIS — M25511 Pain in right shoulder: Secondary | ICD-10-CM | POA: Diagnosis not present

## 2021-11-08 DIAGNOSIS — J449 Chronic obstructive pulmonary disease, unspecified: Secondary | ICD-10-CM | POA: Diagnosis not present

## 2021-11-08 DIAGNOSIS — Z9981 Dependence on supplemental oxygen: Secondary | ICD-10-CM | POA: Diagnosis not present

## 2021-11-08 DIAGNOSIS — M898X1 Other specified disorders of bone, shoulder: Secondary | ICD-10-CM | POA: Diagnosis not present

## 2021-11-08 DIAGNOSIS — R030 Elevated blood-pressure reading, without diagnosis of hypertension: Secondary | ICD-10-CM | POA: Diagnosis not present

## 2021-11-09 ENCOUNTER — Ambulatory Visit: Payer: Medicare Other | Admitting: Interventional Cardiology

## 2021-11-13 DIAGNOSIS — J449 Chronic obstructive pulmonary disease, unspecified: Secondary | ICD-10-CM | POA: Diagnosis not present

## 2021-11-13 DIAGNOSIS — M25511 Pain in right shoulder: Secondary | ICD-10-CM | POA: Diagnosis not present

## 2021-11-13 DIAGNOSIS — Z9981 Dependence on supplemental oxygen: Secondary | ICD-10-CM | POA: Diagnosis not present

## 2021-11-13 DIAGNOSIS — N189 Chronic kidney disease, unspecified: Secondary | ICD-10-CM | POA: Diagnosis not present

## 2021-11-13 DIAGNOSIS — R030 Elevated blood-pressure reading, without diagnosis of hypertension: Secondary | ICD-10-CM | POA: Diagnosis not present

## 2021-11-13 DIAGNOSIS — Z7901 Long term (current) use of anticoagulants: Secondary | ICD-10-CM | POA: Diagnosis not present

## 2021-11-13 DIAGNOSIS — I2510 Atherosclerotic heart disease of native coronary artery without angina pectoris: Secondary | ICD-10-CM | POA: Diagnosis not present

## 2021-11-13 DIAGNOSIS — Z87891 Personal history of nicotine dependence: Secondary | ICD-10-CM | POA: Diagnosis not present

## 2021-11-13 DIAGNOSIS — M898X1 Other specified disorders of bone, shoulder: Secondary | ICD-10-CM | POA: Diagnosis not present

## 2021-11-15 ENCOUNTER — Telehealth: Payer: Self-pay | Admitting: Pulmonary Disease

## 2021-11-15 DIAGNOSIS — Z0000 Encounter for general adult medical examination without abnormal findings: Secondary | ICD-10-CM | POA: Diagnosis not present

## 2021-11-15 DIAGNOSIS — E039 Hypothyroidism, unspecified: Secondary | ICD-10-CM | POA: Diagnosis not present

## 2021-11-15 DIAGNOSIS — N183 Chronic kidney disease, stage 3 unspecified: Secondary | ICD-10-CM | POA: Diagnosis not present

## 2021-11-15 DIAGNOSIS — I2510 Atherosclerotic heart disease of native coronary artery without angina pectoris: Secondary | ICD-10-CM | POA: Diagnosis not present

## 2021-11-15 DIAGNOSIS — J9611 Chronic respiratory failure with hypoxia: Secondary | ICD-10-CM

## 2021-11-15 DIAGNOSIS — M797 Fibromyalgia: Secondary | ICD-10-CM | POA: Diagnosis not present

## 2021-11-15 DIAGNOSIS — Z85048 Personal history of other malignant neoplasm of rectum, rectosigmoid junction, and anus: Secondary | ICD-10-CM | POA: Diagnosis not present

## 2021-11-16 NOTE — Telephone Encounter (Signed)
Called and spoke with [name]. She stated that the patient was seen by RA on 10/30/21 and was under the impression that the order for the POC had been sent to [employer]. I reviewed the orders for that day and a DME order was placed, but it did not contain the [facility] name. I advised her that I would fix this and have it sent to [employer] today. She verbalized understanding.

## 2021-11-19 ENCOUNTER — Telehealth: Payer: Self-pay | Admitting: Pulmonary Disease

## 2021-11-19 NOTE — Telephone Encounter (Signed)
LM to let pt know we do not have any control over what pt's insurance will or won't cover for POCs & to contact pt's insurance to see what should be or shouldn't be covered.

## 2021-11-20 DIAGNOSIS — R030 Elevated blood-pressure reading, without diagnosis of hypertension: Secondary | ICD-10-CM | POA: Diagnosis not present

## 2021-11-20 DIAGNOSIS — N189 Chronic kidney disease, unspecified: Secondary | ICD-10-CM | POA: Diagnosis not present

## 2021-11-20 DIAGNOSIS — J449 Chronic obstructive pulmonary disease, unspecified: Secondary | ICD-10-CM | POA: Diagnosis not present

## 2021-11-20 DIAGNOSIS — M25511 Pain in right shoulder: Secondary | ICD-10-CM | POA: Diagnosis not present

## 2021-11-20 DIAGNOSIS — Z9981 Dependence on supplemental oxygen: Secondary | ICD-10-CM | POA: Diagnosis not present

## 2021-11-20 DIAGNOSIS — Z87891 Personal history of nicotine dependence: Secondary | ICD-10-CM | POA: Diagnosis not present

## 2021-11-20 DIAGNOSIS — I2510 Atherosclerotic heart disease of native coronary artery without angina pectoris: Secondary | ICD-10-CM | POA: Diagnosis not present

## 2021-11-20 DIAGNOSIS — M898X1 Other specified disorders of bone, shoulder: Secondary | ICD-10-CM | POA: Diagnosis not present

## 2021-11-20 DIAGNOSIS — Z7901 Long term (current) use of anticoagulants: Secondary | ICD-10-CM | POA: Diagnosis not present

## 2021-11-25 DIAGNOSIS — J9611 Chronic respiratory failure with hypoxia: Secondary | ICD-10-CM | POA: Diagnosis not present

## 2021-11-26 ENCOUNTER — Telehealth: Payer: Self-pay | Admitting: Pulmonary Disease

## 2021-11-26 NOTE — Telephone Encounter (Signed)
Called pt and spoke with daughter [name] letting her know info from [place] from prior encounter. [name] stated that she would call [employer] about the info they found out from insurance and go from there. Nothing further needed.

## 2021-11-28 DIAGNOSIS — M898X1 Other specified disorders of bone, shoulder: Secondary | ICD-10-CM | POA: Diagnosis not present

## 2021-11-28 DIAGNOSIS — M25511 Pain in right shoulder: Secondary | ICD-10-CM | POA: Diagnosis not present

## 2021-11-28 DIAGNOSIS — I2510 Atherosclerotic heart disease of native coronary artery without angina pectoris: Secondary | ICD-10-CM | POA: Diagnosis not present

## 2021-11-28 DIAGNOSIS — R030 Elevated blood-pressure reading, without diagnosis of hypertension: Secondary | ICD-10-CM | POA: Diagnosis not present

## 2021-11-28 DIAGNOSIS — Z7901 Long term (current) use of anticoagulants: Secondary | ICD-10-CM | POA: Diagnosis not present

## 2021-11-28 DIAGNOSIS — J449 Chronic obstructive pulmonary disease, unspecified: Secondary | ICD-10-CM | POA: Diagnosis not present

## 2021-11-28 DIAGNOSIS — Z9981 Dependence on supplemental oxygen: Secondary | ICD-10-CM | POA: Diagnosis not present

## 2021-11-28 DIAGNOSIS — N189 Chronic kidney disease, unspecified: Secondary | ICD-10-CM | POA: Diagnosis not present

## 2021-11-28 DIAGNOSIS — Z87891 Personal history of nicotine dependence: Secondary | ICD-10-CM | POA: Diagnosis not present

## 2021-11-29 ENCOUNTER — Telehealth: Payer: Self-pay | Admitting: Pulmonary Disease

## 2021-11-29 NOTE — Telephone Encounter (Signed)
Called and spoke with daughter and she was wanting to know if it was possible to get some additional tanks of oxygen just in case her power goes out! I told her that I would have to get Dr Angus Palms approval for additional tanks.   Dr Elsworth Soho please advise

## 2021-11-30 ENCOUNTER — Other Ambulatory Visit: Payer: Self-pay

## 2021-11-30 DIAGNOSIS — J9611 Chronic respiratory failure with hypoxia: Secondary | ICD-10-CM

## 2021-11-30 NOTE — Telephone Encounter (Signed)
Called and spoke with daughter and let her know the order was placed per Dr Angus Palms approval for mother to have additional tanks of oxygen if she needs them  Nothing further  Verified DME with dauther

## 2021-12-01 ENCOUNTER — Other Ambulatory Visit: Payer: Self-pay | Admitting: Interventional Cardiology

## 2021-12-03 DIAGNOSIS — Z7901 Long term (current) use of anticoagulants: Secondary | ICD-10-CM | POA: Diagnosis not present

## 2021-12-03 DIAGNOSIS — N189 Chronic kidney disease, unspecified: Secondary | ICD-10-CM | POA: Diagnosis not present

## 2021-12-03 DIAGNOSIS — M25511 Pain in right shoulder: Secondary | ICD-10-CM | POA: Diagnosis not present

## 2021-12-03 DIAGNOSIS — I2510 Atherosclerotic heart disease of native coronary artery without angina pectoris: Secondary | ICD-10-CM | POA: Diagnosis not present

## 2021-12-03 DIAGNOSIS — Z9981 Dependence on supplemental oxygen: Secondary | ICD-10-CM | POA: Diagnosis not present

## 2021-12-03 DIAGNOSIS — R030 Elevated blood-pressure reading, without diagnosis of hypertension: Secondary | ICD-10-CM | POA: Diagnosis not present

## 2021-12-03 DIAGNOSIS — Z87891 Personal history of nicotine dependence: Secondary | ICD-10-CM | POA: Diagnosis not present

## 2021-12-03 DIAGNOSIS — M898X1 Other specified disorders of bone, shoulder: Secondary | ICD-10-CM | POA: Diagnosis not present

## 2021-12-03 DIAGNOSIS — J449 Chronic obstructive pulmonary disease, unspecified: Secondary | ICD-10-CM | POA: Diagnosis not present

## 2021-12-03 NOTE — Telephone Encounter (Signed)
Pt last saw Dr Irish Lack 09/07/21, last labs 09/24/21 Creat 1.04, age 88, weight 101.6kg, based on specified criteria pt is on appropriate dosage of Eliquis 5mg  BID for afib. Will refill rx.

## 2021-12-06 ENCOUNTER — Telehealth: Payer: Self-pay | Admitting: Pulmonary Disease

## 2021-12-06 ENCOUNTER — Other Ambulatory Visit: Payer: Self-pay | Admitting: *Deleted

## 2021-12-06 NOTE — Patient Outreach (Signed)
[facility]) Care Management  12/06/2021  Sheryl Curtis 1931-11-13 [id number]   [place] had received a call from [place] daughter . Patient needs a lighter weight oxygen tank and wants the [employer] concentrator. Per Sheryl Curtis the patient is unable to go outside the home. Sheryl Curtis stated that the patient weighs @225 -230 pounds and all the daughters have back problems. Trying to push patient in wheelchair and with oxygen tank is really hard. Per DME company she would have to pay out of pocket for it. Patient is not able to afford the cost of the [employer] tank.  RN Health Coach called [employer] and was told that they would need an order. Per [employer] after they received the order they would have the respiratory therapist to evaluate. They stated that after a patient has been on oxygen for 45 days another type tank is not covered.  RN called and spoke with Sheryl Curtis and explained the above information. She will call the Dr to get an order sent to [employer] and see what happens from there. Patient daughter understands that they might not cover it.  Plan: Daughter Sheryl Curtis will get order from Dr sent to [place] will send out a respiratory therapist for evaluation  [place] Management [phone]

## 2021-12-07 ENCOUNTER — Telehealth: Payer: Self-pay | Admitting: Pulmonary Disease

## 2021-12-07 NOTE — Telephone Encounter (Signed)
Spoke with [name] (per [facility]) and scheduled pt for OV on Monday 12/10/21 at 11:45 with Dr. Elsworth Soho. Nothing further needed at this time.

## 2021-12-07 NOTE — Telephone Encounter (Signed)
I called and spoke with Sheryl Curtis ok per DPR  She said that [employer] is trying to charge the pt $3000 for POC  She wants order sent to another DME- [facility], do you need new order or could you work off the original?

## 2021-12-07 NOTE — Telephone Encounter (Signed)
Spoke with [name] (daughter) per DPR. Pt is having increased SOB which has gradually gotten worse. Kathy checked O2 sat which was 93% on 3L. [name] was informed that O2 sat should stay above 90% at all times and that pt may increase O2 if needed to maintain sat above 90%. Pt also c/o increased swelling in ankles. Pt states she has been taking her lasix every other day. Pt informed order is written as everyday in Epic and she should clarify. Pt states sitting up and laying still helps with the SOB. Pt asked if she was using PRN Albuterol and she stated she was not then RN informed pt she should be. Pt stated she is using Breztri as directed. Pt denies f/c or GI upset. Dr. Elsworth Soho please advise. Thank you

## 2021-12-10 ENCOUNTER — Other Ambulatory Visit: Payer: Self-pay

## 2021-12-10 ENCOUNTER — Encounter: Payer: Self-pay | Admitting: Pulmonary Disease

## 2021-12-10 ENCOUNTER — Ambulatory Visit: Payer: Medicare Other | Admitting: Pulmonary Disease

## 2021-12-10 VITALS — BP 144/78 | HR 92 | Temp 98.7°F | Ht 64.0 in | Wt 219.8 lb

## 2021-12-10 DIAGNOSIS — J9611 Chronic respiratory failure with hypoxia: Secondary | ICD-10-CM

## 2021-12-10 DIAGNOSIS — J4531 Mild persistent asthma with (acute) exacerbation: Secondary | ICD-10-CM | POA: Diagnosis not present

## 2021-12-10 DIAGNOSIS — R918 Other nonspecific abnormal finding of lung field: Secondary | ICD-10-CM | POA: Diagnosis not present

## 2021-12-10 DIAGNOSIS — I5032 Chronic diastolic (congestive) heart failure: Secondary | ICD-10-CM | POA: Diagnosis not present

## 2021-12-10 NOTE — Addendum Note (Signed)
Addended by: Gavin Potters R on: 12/10/2021 02:19 PM   Modules accepted: Orders

## 2021-12-10 NOTE — Assessment & Plan Note (Signed)
Continue Breztri, no evidence of COPD but does have some variability in FEV1 testing asthma and subjective benefit with this inhaler

## 2021-12-10 NOTE — Progress Notes (Addendum)
Subjective:    Patient ID: Sheryl Curtis, female    DOB: 12/29/1931, 88 y.o.   MRN: [id number]  HPI  88yo remote smoker with severe RA,for FU of  airway Obstruction & OSA She has severe rheumatoid arthritis with previous MTX use and fibromyalgia CT chest has shown multiple pulmonary nodules that have been stable, last 2018 12/2020 started on home O2   PMH - rectal cancer status post resection in 2007, stent to LAD in 04/2013. on Lasix daily for bipedal edema  Last OV 10/2021 >> POC, pred taper [name] (daughter) called and patient was scheduled as an acute visit pt is having increased SOB which has gradually gotten worse. [name] checked O2 sat which was 93% on 3Lc/o increased swelling in ankles. Pt states she has been taking her lasix every other day. She arrives in a wheelchair today, her sciatica is worse, she tries to avoid pain medications.  Oxygen saturation is 91% on room air She has been unable to obtain a POC, [employer] were charging her $3000 she has been on home oxygen since 2022 She continues on Lasix 20 mg daily, weight has dropped from 224 pounds to 219.  Last office visit we gave her a prednisone taper, this does not seem to have helped much  Daughter accompanies and corroborates history, she requests hospital bed, she is  having difficulty transferring Reviewed cardiology evaluation 08/2021  Significant tests/ events reviewed  HRCT 12/2020 no ILD, minimal increase in size of multiple pulm nodules   CT chest 03/2017 -stable multiple pulmonary nodules since 2015   Spirometry in 05/2010 showed FEV1 of 76% improvement postbronchodilator to 80%-1.60 with a ratio of 75 suggesting mild restriction. Spirometry 03/2014 FEV1 of 1.25-64%, with ratio 65 and FVC of 1.91-72%.   PFTs - 12/2014 - no airway obstruction, ratio 71, pos BD response PFTs 11/2018, and mild airway obstruction with FEV1 79%, TLC 101%, DLCO normal, no bronchodilator response   06/2015 Admit for UTI and Sepsis      NPSG >> AHI 10/h corrected by CPAP 10 cm  Past Medical History:  Diagnosis Date   ALLERGIC RHINITIS    Atrial fibrillation (HCC)    CANCER, COLORECTAL    CHEST PAIN, ATYPICAL    CKD (chronic kidney disease)    DYSPNEA    Essential hypertension, benign    FIBROMYALGIA    G E R D    History of stress test    Myoview 8/16:  EF 69%, anterior and apical defect c/w breast attenuation; Low Risk    INSOMNIA    Morbid obesity ([place])    Other and unspecified angina pectoris    Sleep apnea       Review of Systems neg for any significant sore throat, dysphagia, itching, sneezing, nasal congestion or excess/ purulent secretions, fever, chills, sweats, unintended wt loss, pleuritic or exertional cp, hempoptysis, orthopnea pnd or change in chronic leg swelling. Also denies presyncope, palpitations, heartburn, abdominal pain, nausea, vomiting, diarrhea or change in bowel or urinary habits, dysuria,hematuria, rash, arthralgias, visual complaints, headache, numbness weakness or ataxia.     Objective:   Physical Exam  Gen. Pleasant, obese, in no distress ENT - no lesions, no post nasal drip Neck: No JVD, no thyromegaly, no carotid bruits Lungs: no use of accessory muscles, no dullness to percussion, right basal dry rales no rhonchi  Cardiovascular: Rhythm regular, heart sounds  normal, no murmurs or gallops, 1+ peripheral edema Musculoskeletal: No deformities, no cyanosis or clubbing , no tremors  Assessment & Plan:   Advance directives -she expresses a desire for natural death, went over paperwork for [facility] and living well with the daughter and the patient, they will get this paper filled out and notarized  Pulmonary nodules -1 year follow-up CT chest without contrast will be scheduled for March 2023

## 2021-12-10 NOTE — Assessment & Plan Note (Addendum)
Etiology of her hypoxia is not clear Last echo 07/2021 had poor windows and not able to estimate RV pressures.  She does not want to undergo heart cath. No ILD has been noted Unfortunately she was not eligible for a POC She will continue to use 2 L of oxygen at all times Daughter requests hospital bed and I have asked her discussed with PCP she will likely qualify based on her severe sciatica and poor mobility

## 2021-12-10 NOTE — Assessment & Plan Note (Signed)
Appears to be at her dry weight, continue 20 mg of Lasix daily

## 2021-12-10 NOTE — Patient Instructions (Signed)
°  X CT chest without contrast for March 2023 to follow-up on nodules  Stay on half tablet of Lasix daily Contact PCP for hospital bed

## 2021-12-12 NOTE — Telephone Encounter (Addendum)
Order was sent to [employer] on 1/20 but order for [facility] [employer] Eval/POC was sent to [facility] on 2/3.  I spoke to pt's dtr and she states [employer] hasn't spoken to her about eval.  I told her I would call Danielle at [place] and either her or me would give her a call back.  Spoke to [place] at [place] and she is going to call daughter.  Nothing further needed.

## 2021-12-13 ENCOUNTER — Other Ambulatory Visit: Payer: Self-pay | Admitting: Interventional Cardiology

## 2021-12-13 DIAGNOSIS — M25511 Pain in right shoulder: Secondary | ICD-10-CM | POA: Diagnosis not present

## 2021-12-13 DIAGNOSIS — J449 Chronic obstructive pulmonary disease, unspecified: Secondary | ICD-10-CM | POA: Diagnosis not present

## 2021-12-13 DIAGNOSIS — N189 Chronic kidney disease, unspecified: Secondary | ICD-10-CM | POA: Diagnosis not present

## 2021-12-13 DIAGNOSIS — Z9981 Dependence on supplemental oxygen: Secondary | ICD-10-CM | POA: Diagnosis not present

## 2021-12-13 DIAGNOSIS — R030 Elevated blood-pressure reading, without diagnosis of hypertension: Secondary | ICD-10-CM | POA: Diagnosis not present

## 2021-12-13 DIAGNOSIS — I2510 Atherosclerotic heart disease of native coronary artery without angina pectoris: Secondary | ICD-10-CM | POA: Diagnosis not present

## 2021-12-13 DIAGNOSIS — Z7901 Long term (current) use of anticoagulants: Secondary | ICD-10-CM | POA: Diagnosis not present

## 2021-12-13 DIAGNOSIS — Z87891 Personal history of nicotine dependence: Secondary | ICD-10-CM | POA: Diagnosis not present

## 2021-12-13 DIAGNOSIS — M898X1 Other specified disorders of bone, shoulder: Secondary | ICD-10-CM | POA: Diagnosis not present

## 2021-12-14 ENCOUNTER — Telehealth: Payer: Self-pay | Admitting: Pulmonary Disease

## 2021-12-14 NOTE — Telephone Encounter (Signed)
Rigoberto Noel, MD  You 2 minutes ago (11:01 AM)   Not sure that we can offer her much over the phone.  Her fluid status seemed to be okay when I saw her 4 days ago, hope leg swelling has not increased, if so we can increase Lasix to 40 mg daily  She may need evaluation at urgent care or ED or by any of her doctors if continues to get worse.  Maintain oxygen saturation more than 90%       Called and spoke with [name] letting her know the info per RA and she verbalized understanding. She stated that when pt got up awhile ago, pt had an episode where she could not breathe and hollered out to her and after she used her rescue inhaler, she seemed to have gotten some relief. Stated again to [name] that if pt continues to have these issues that she needs to go get evaluated and she verbalized understanding. Nothing further needed.

## 2021-12-14 NOTE — Telephone Encounter (Signed)
Called and spoke with pt's daughter [name] who states since pt's OV with [place] on 2/13, pt has had complaints of increased SOB and states that she is pretty much just laying around as she is sleeping more than usual.  [name] said that pt also has had some wheezing. Denies any complaints of coughing or any fever.  Pt does use the Breztri inhaler twice a day as prescribed and uses the nebulizer twice a day. Pt did use her rescue inhaler once yesterday.  [name] stated that pt does use her O2 at 2L. Asked if pt had a pulse ox that she could use to check pt's sats and she said that on the 2L, pt's sats have been going between 89-90%.  [name] and pt want to know what can be recommended to help with pt's symptoms. Dr. Elsworth Soho, please advise.

## 2021-12-18 DIAGNOSIS — J449 Chronic obstructive pulmonary disease, unspecified: Secondary | ICD-10-CM | POA: Diagnosis not present

## 2021-12-18 DIAGNOSIS — Z87891 Personal history of nicotine dependence: Secondary | ICD-10-CM | POA: Diagnosis not present

## 2021-12-18 DIAGNOSIS — N189 Chronic kidney disease, unspecified: Secondary | ICD-10-CM | POA: Diagnosis not present

## 2021-12-18 DIAGNOSIS — Z9981 Dependence on supplemental oxygen: Secondary | ICD-10-CM | POA: Diagnosis not present

## 2021-12-18 DIAGNOSIS — R030 Elevated blood-pressure reading, without diagnosis of hypertension: Secondary | ICD-10-CM | POA: Diagnosis not present

## 2021-12-18 DIAGNOSIS — Z7901 Long term (current) use of anticoagulants: Secondary | ICD-10-CM | POA: Diagnosis not present

## 2021-12-18 DIAGNOSIS — I2510 Atherosclerotic heart disease of native coronary artery without angina pectoris: Secondary | ICD-10-CM | POA: Diagnosis not present

## 2021-12-18 DIAGNOSIS — M898X1 Other specified disorders of bone, shoulder: Secondary | ICD-10-CM | POA: Diagnosis not present

## 2021-12-18 DIAGNOSIS — M25511 Pain in right shoulder: Secondary | ICD-10-CM | POA: Diagnosis not present

## 2021-12-20 ENCOUNTER — Emergency Department (HOSPITAL_COMMUNITY)
Admission: EM | Admit: 2021-12-20 | Discharge: 2021-12-20 | Disposition: A | Discharge status: Home / Self Care | Payer: Medicare Other | Attending: Emergency Medicine | Admitting: Emergency Medicine

## 2021-12-20 ENCOUNTER — Encounter (HOSPITAL_COMMUNITY): Payer: Self-pay

## 2021-12-20 ENCOUNTER — Other Ambulatory Visit: Payer: Self-pay

## 2021-12-20 ENCOUNTER — Inpatient Hospital Stay: Admission: RE | Admit: 2021-12-20 | Payer: Medicare Other | Source: Ambulatory Visit

## 2021-12-20 ENCOUNTER — Emergency Department (HOSPITAL_COMMUNITY): Payer: Medicare Other

## 2021-12-20 DIAGNOSIS — N189 Chronic kidney disease, unspecified: Secondary | ICD-10-CM | POA: Insufficient documentation

## 2021-12-20 DIAGNOSIS — R5381 Other malaise: Secondary | ICD-10-CM | POA: Diagnosis not present

## 2021-12-20 DIAGNOSIS — J45901 Unspecified asthma with (acute) exacerbation: Secondary | ICD-10-CM | POA: Insufficient documentation

## 2021-12-20 DIAGNOSIS — J9811 Atelectasis: Secondary | ICD-10-CM | POA: Diagnosis not present

## 2021-12-20 DIAGNOSIS — R531 Weakness: Secondary | ICD-10-CM | POA: Diagnosis not present

## 2021-12-20 DIAGNOSIS — I4891 Unspecified atrial fibrillation: Secondary | ICD-10-CM | POA: Insufficient documentation

## 2021-12-20 DIAGNOSIS — I129 Hypertensive chronic kidney disease with stage 1 through stage 4 chronic kidney disease, or unspecified chronic kidney disease: Secondary | ICD-10-CM | POA: Insufficient documentation

## 2021-12-20 DIAGNOSIS — Z7901 Long term (current) use of anticoagulants: Secondary | ICD-10-CM | POA: Insufficient documentation

## 2021-12-20 DIAGNOSIS — I499 Cardiac arrhythmia, unspecified: Secondary | ICD-10-CM | POA: Diagnosis not present

## 2021-12-20 DIAGNOSIS — R0602 Shortness of breath: Secondary | ICD-10-CM | POA: Insufficient documentation

## 2021-12-20 DIAGNOSIS — R778 Other specified abnormalities of plasma proteins: Secondary | ICD-10-CM | POA: Insufficient documentation

## 2021-12-20 DIAGNOSIS — R000 Tachycardia, unspecified: Secondary | ICD-10-CM | POA: Diagnosis not present

## 2021-12-20 DIAGNOSIS — I2510 Atherosclerotic heart disease of native coronary artery without angina pectoris: Secondary | ICD-10-CM | POA: Diagnosis not present

## 2021-12-20 DIAGNOSIS — R0789 Other chest pain: Secondary | ICD-10-CM | POA: Diagnosis not present

## 2021-12-20 DIAGNOSIS — R079 Chest pain, unspecified: Secondary | ICD-10-CM | POA: Diagnosis not present

## 2021-12-20 LAB — CBC
HCT: 41 % (ref 36.0–46.0)
Hemoglobin: 13.2 g/dL (ref 12.0–15.0)
MCH: 29.9 pg (ref 26.0–34.0)
MCHC: 32.2 g/dL (ref 30.0–36.0)
MCV: 93 fL (ref 80.0–100.0)
Platelets: 185 10*3/uL (ref 150–400)
RBC: 4.41 MIL/uL (ref 3.87–5.11)
RDW: 14.3 % (ref 11.5–15.5)
WBC: 14.7 10*3/uL — ABNORMAL HIGH (ref 4.0–10.5)
nRBC: 0 % (ref 0.0–0.2)

## 2021-12-20 LAB — TROPONIN I (HIGH SENSITIVITY)
Troponin I (High Sensitivity): 31 ng/L — ABNORMAL HIGH (ref <18)
Troponin I (High Sensitivity): 38 ng/L — ABNORMAL HIGH (ref <18)

## 2021-12-20 LAB — BASIC METABOLIC PANEL
Anion gap: 8 (ref 5–15)
BUN: 20 mg/dL (ref 8–23)
CO2: 31 mmol/L (ref 22–32)
Calcium: 9.1 mg/dL (ref 8.9–10.3)
Chloride: 96 mmol/L — ABNORMAL LOW (ref 98–111)
Creatinine, Ser: 1.11 mg/dL — ABNORMAL HIGH (ref 0.44–1.00)
GFR, Estimated: 48 mL/min — ABNORMAL LOW (ref >60)
Glucose, Bld: 134 mg/dL — ABNORMAL HIGH (ref 70–99)
Potassium: 3.6 mmol/L (ref 3.5–5.1)
Sodium: 135 mmol/L (ref 135–145)

## 2021-12-20 LAB — PROTIME-INR
INR: 1.1 (ref 0.8–1.2)
Prothrombin Time: 14.6 seconds (ref 11.4–15.2)

## 2021-12-20 LAB — BRAIN NATRIURETIC PEPTIDE: B Natriuretic Peptide: 112.9 pg/mL — ABNORMAL HIGH (ref 0.0–100.0)

## 2021-12-20 MED ORDER — DILTIAZEM LOAD VIA INFUSION
10.0000 mg | Freq: Once | INTRAVENOUS | Status: AC
  Administered 2021-12-20: 10 mg via INTRAVENOUS
  Filled 2021-12-20: qty 10

## 2021-12-20 MED ORDER — DILTIAZEM HCL-DEXTROSE 125-5 MG/125ML-% IV SOLN (PREMIX)
5.0000 mg/h | INTRAVENOUS | Status: DC
  Administered 2021-12-20: 5 mg/h via INTRAVENOUS
  Filled 2021-12-20: qty 125

## 2021-12-20 NOTE — ED Notes (Signed)
Pt given ice chips per EDP  

## 2021-12-20 NOTE — ED Triage Notes (Signed)
Per EMS, Pt, from home, c/o weakness, intermittent central chest pain, and generally feeling bad x "a couple days."  Pain score 5/10.  Hx of A fib.    Pt given 10mg  Cardizem en route.  Heart rate fluctuated between 120-170 en route.  Pt chronically on 3L [place].

## 2021-12-20 NOTE — ED Provider Notes (Signed)
[facility] EMERGENCY DEPARTMENT Provider Note   CSN: [id number] Arrival date & time: 12/20/21  1254     History  Chief Complaint  Patient presents with   Weakness   Atrial Fibrillation    Sheryl Curtis is a 88 y.o. female. The patient presents to the emergency department via EMS due to chest pain that began just prior to lunch today. The patient has a history of atrial fibrillation and EMS noted atrial fibrillation rates between 120-170. EMS administered 10mg  Cardizem en route. The patient states the pain radiates from her chest to her left shoulder. She currently takes cardizem as an antiarrythmic and Eliquis as a blood thinner. PMH significant for A-fib, HTN, GERD, dyspnea, atypical chest pain, coronary atherosclerosis, CKD, asthma with acute exacerbation, CAFL, elevated troponin  HPI     Home Medications Prior to Admission medications   Medication Sig Start Date End Date Taking? Authorizing Provider  albuterol (PROVENTIL) (2.5 MG/3ML) 0.083% nebulizer solution USE 1 VIAL VIA NEBULIZER EVERY 6 HOURS AS NEEDED Patient taking differently: Take 2.5 mg by nebulization in the morning and at bedtime. 02/20/21   Rigoberto Noel, MD  Albuterol Sulfate (PROAIR RESPICLICK) 951 (90 Base) MCG/ACT AEPB Inhale 1-2 puffs into the lungs every 6 (six) hours as needed (for shortness of breath and/or wheezing.). Patient taking differently: Inhale 1-2 puffs into the lungs in the morning and at bedtime. 10/31/20   Rigoberto Noel, MD  ALPRAZolam Duanne Moron) 0.25 MG tablet Take 0.25 mg by mouth 3 (three) times daily as needed for anxiety.    [provider]  apixaban (ELIQUIS) 5 MG TABS tablet TAKE 1 TABLET(5 MG) BY MOUTH TWICE DAILY 12/03/21   Jettie Booze, MD  Budeson-Glycopyrrol-Formoterol (BREZTRI AEROSPHERE) 160-9-4.8 MCG/ACT AERO Inhale 2 puffs into the lungs in the morning and at bedtime. 09/18/20   Lauraine Rinne, NP  diltiazem (CARDIZEM CD) 120 MG 24 hr capsule TAKE 1  CAPSULE(120 MG) BY MOUTH DAILY 12/13/21   Jettie Booze, MD  DULoxetine (CYMBALTA) 60 MG capsule Take 60 mg by mouth daily. 09/25/17   [provider]  furosemide (LASIX) 40 MG tablet Take 0.5 tablets (20 mg total) by mouth daily. Check BMP follow-up with PCP before resuming 08/27/21   Antonieta Pert, MD  ipratropium (ATROVENT) 0.03 % nasal spray Place 2 sprays into both nostrils every 12 (twelve) hours. Patient not taking: Reported on 12/10/2021    [provider]  isosorbide mononitrate (IMDUR) 60 MG 24 hr tablet TAKE 1 TABLET BY MOUTH  DAILY 09/27/21   Jettie Booze, MD  levothyroxine (SYNTHROID) 25 MCG tablet Take 25 mcg by mouth daily before breakfast.    [provider]  Nebulizers (COMPRESSOR NEBULIZER) MISC 1 Device by Does not apply route once. 04/04/16   Deneise Lever, MD  nitroGLYCERIN (NITROSTAT) 0.4 MG SL tablet Place 1 tablet (0.4 mg total) under the tongue every 5 (five) minutes as needed for chest pain. 09/07/21   Jettie Booze, MD  polyethylene glycol powder [facility]) 17 GM/SCOOP powder Take 17 g by mouth daily as needed for mild constipation.    [provider]  potassium chloride SA (KLOR-CON) 20 MEQ tablet Take 20 mEq by mouth daily. 08/31/21   [provider]  pregabalin (LYRICA) 50 MG capsule Take 50 mg by mouth 2 (two) times daily. 06/14/21   [provider]  psyllium (METAMUCIL) 58.6 % powder Take 1 packet by mouth daily.    [provider]  Respiratory Therapy Supplies (FLUTTER) DEVI Use flutter device 3 times a day 07/22/18   Martyn Ehrich, NP  rosuvastatin (CRESTOR) 20 MG tablet TAKE 1 TABLET BY MOUTH  DAILY 11/23/20   Jettie Booze, MD  senna (SENOKOT) 8.6 MG TABS tablet Take 2 tablets (17.2 mg total) by mouth at bedtime. 05/03/20   Raulkar, Clide Deutscher, MD      Allergies    Aspirin, Hydroxyquinolines, Imipramine hcl, Pamelor [nortriptyline hcl], Prednisone, Pregabalin, Procardia  [nifedipine], Tramadol, Ace inhibitors, Hydroxyzine hcl, Influenza vaccines, Morphine and related, Oxycodone-acetaminophen, Gabapentin, and Hydroxychloroquine sulfate    Review of Systems   Review of Systems  Respiratory:  Negative for shortness of breath.   Cardiovascular:  Positive for chest pain.   Physical Exam Updated Vital Signs BP (!) 152/84    Pulse 79    Temp 98 F (36.7 C) (Oral)    Resp (!) 22    Ht 5\' 4"  (1.626 m)    Wt 99.3 kg    SpO2 95%    BMI 37.59 kg/m  Physical Exam Constitutional:      Appearance: She is obese.  HENT:     Head: Normocephalic and atraumatic.  Eyes:     Conjunctiva/sclera: Conjunctivae normal.  Cardiovascular:     Rate and Rhythm: Tachycardia present. Rhythm irregular.  Pulmonary:     Effort: Pulmonary effort is normal. No respiratory distress.     Breath sounds: Normal breath sounds.  Abdominal:     Palpations: Abdomen is soft.     Tenderness: There is no abdominal tenderness.  Skin:    General: Skin is warm and dry.  Neurological:     Mental Status: She is alert.    ED Results / Procedures / Treatments   Labs (all labs ordered are listed, but only abnormal results are displayed) Labs Reviewed  BASIC METABOLIC PANEL - Abnormal; Notable for the following components:      Result Value   Chloride 96 (*)    Glucose, Bld 134 (*)    Creatinine, Ser 1.11 (*)    GFR, Estimated 48 (*)    All other components within normal limits  CBC - Abnormal; Notable for the following components:   WBC 14.7 (*)    All other components within normal limits  TROPONIN I (HIGH SENSITIVITY) - Abnormal; Notable for the following components:   Troponin I (High Sensitivity) 31 (*)    All other components within normal limits  PROTIME-INR  TROPONIN I (HIGH SENSITIVITY)    EKG EKG Interpretation  Date/Time:  Thursday December 20 2021 13:52:03 EST Ventricular Rate:  85 PR Interval:  148 QRS Duration: 77 QT Interval:  376 QTC Calculation: 448 R  Axis:   75 Text Interpretation: Sinus rhythm Multiple premature complexes, vent & supraven Since last tracing EARLIER SAME DATE Sinus rhythm has replaced Atrial fibrillation with rapid ventricular response Confirmed by Calvert Cantor [phone]) on 12/20/2021 2:02:51 PM  Radiology DG Chest Port 1 View  Result Date: 12/20/2021 CLINICAL DATA:  Chest pain EXAM: PORTABLE CHEST 1 VIEW COMPARISON:  09/24/2021 FINDINGS: Heart is mildly enlarged. Left basilar airspace opacity may be due to atelectasis or pneumonia. Lungs otherwise clear. Atherosclerotic calcifications present within the aortic arch. No pulmonary vascular congestion. IMPRESSION: Left basilar opacity may be due to atelectasis or pneumonia. Electronically Signed   By: Miachel Roux M.D.   On: 12/20/2021 13:40    Procedures .1-3 Lead EKG Interpretation Performed by: Dorothyann Peng, PA Authorized by: Dorothyann Peng,  PA     ECG rate:  140   ECG rate assessment: normal     Rhythm: atrial fibrillation     Ectopy: none      Medications Ordered in ED Medications - No data to display  ED Course/ Medical Decision Making/ A&P                           Medical Decision Making Amount and/or Complexity of Data Reviewed Labs: ordered. Radiology: ordered.   This patient presents to the ED for concern of chest pain, this involves an extensive number of treatment options, and is a complaint that carries with it a high risk of complications and morbidity.  The differential diagnosis includes but is not limited to atrial fibrillation, ACS, dissection, PE, pneumonia, and more   Co morbidities that complicate the patient evaluation  A-fib, HTN, atypical chest pain, shortness of breath   Additional history obtained:  Additional history obtained from patient's daughter External records from outside source obtained and reviewed including EMR notes   Lab Tests:  I Ordered, and personally interpreted labs.  The pertinent results include:   Troponin 31 (consistent with previous chronically elevated troponin)   Imaging Studies ordered:  I ordered imaging studies including chest x-ray and high resolution chest CT I independently visualized and interpreted imaging which showed left basilar opacity  I agree with the radiologist interpretation   Cardiac Monitoring:  The patient was maintained on a cardiac monitor.  I personally viewed and interpreted the cardiac monitored which showed an underlying rhythm of: atrial fibrillation w/ RVR, converted to sinus with premature beats   Medicines ordered and prescription drug management:   I have reviewed the patients home medicines and have made adjustments as needed   Test Considered:  D-dimer   Reevaluation:  After the interventions noted above, I reevaluated the patient and found that they have :improved    Dispostion:  The patient converted to sinus rhythm spontaneously without intervention. She currently has no chest pain or shortness of breath. Repeat troponin levels are stable and consistent with previous levels. High resolution chest CT had been ordered by pulmonology and was scheduled for today. The CT was completed today to expedite care. EKG shows sinus rhythm. Patient care transferred to oncoming provider at shift change   Final Clinical Impression(s) / ED Diagnoses Final diagnoses:  Atrial fibrillation, unspecified type [facility])    Rx / DC Orders ED Discharge Orders     None         Dorothyann Peng, [place] 12/20/21 1522    Truddie Hidden, MD 12/21/21 [phone]

## 2021-12-20 NOTE — Discharge Instructions (Addendum)
You were seen today for chest pain due to atrial fibrillation. Your heart converted from atrial fibrillation to sinus rhythm on its own. I recommend follow up with your primary care provider and cardiologist. The CT was completed for your pulmonologist. Return to the emergency department if you develop further life threatening conditions such as chest pain, shortness of breath, or altered level of consciousness.

## 2021-12-20 NOTE — ED Notes (Signed)
X-ray at bedside

## 2021-12-20 NOTE — ED Provider Notes (Signed)
Physical Exam  BP (!) 160/79    Pulse 80    Temp 98 F (36.7 C) (Oral)    Resp (!) 23    Ht 5\' 4"  (1.626 m)    Wt 99.3 kg    SpO2 94%    BMI 37.59 kg/m   Physical Exam Vitals and nursing note reviewed.  Constitutional:      General: She is not in acute distress.    Appearance: She is obese. She is not ill-appearing, toxic-appearing or diaphoretic.  Cardiovascular:     Rate and Rhythm: Normal rate.     Pulses: Normal pulses.  Pulmonary:     Effort: No respiratory distress.     Breath sounds: No wheezing.  Abdominal:     Tenderness: There is no abdominal tenderness. There is no guarding or rebound.  Musculoskeletal:     Right lower leg: No edema.     Left lower leg: No edema.  Neurological:     Mental Status: She is alert.    Procedures  Procedures  ED Course / MDM    Medical Decision Making Amount and/or Complexity of Data Reviewed Labs: ordered. Radiology: ordered.  Risk Prescription drug management.   Accepted handoff at shift change from [facility], [place]. Please see prior provider note for more detail.   Briefly: Patient is 88 y.o. female with history of A-fib presenting to the emergency department for evaluation of chest pain.  She was noted to have rates between 11/17/1966 per EMS where they administered 10 mg of Cardizem and she converted.  The patient is currently on Cardizem and Eliquis for treatment of her A-fib.  DDX: concern for elevated second troponin to rule out ACS.  Plan: Awaiting delta troponin and CT chest.  The CT high-resolution chest was ordered as a courtesy as the patient was post to be receiving a scan today for pulmonology.  After speaking with the [facility] radiology department, there is no one to give an official read for this until tomorrow morning, spoke to Dr. Francoise Ceo who gave me a preliminary reading that showed small bilateral pleural effusions as well as increased mosaic density.  She reports there will be an official read probably  most likely tomorrow.  I do not see this as an emergently needed scan.  The patient does not need to be admitted or held in the emergency department until the reading results.  Vital signs show hypertension with known history.  Afebrile, normal heart rate, satting 93 to 95% on her at home 3 L nasal cannula.  She is not requiring additional oxygen at this time.  Given the CT preliminary reading, ordered BMP which is elevated at 112.9.  First troponin was 31 with repeat at 38.  This is mildly elevated.  Patient had elevated troponins at 24 and 29 two and four months previously.  Her EKG repeat shows that she is in sinus.  Discussed with my attending who does not recommend any cardiology consult and reports the patient can follow-up outpatient.  Pending discharge, the patient returned to A-fib with RVR.  She was given a Cardizem bolus and was started on a Cardizem drip.  She was briefly on a Cardizem drip when she converted back to normal sinus rhythm.  The patient did not have any chest pain or shortness of breath or any palpitations during this episode.  I discussed with the patient possible admission versus going home.  The patient reports that she does not to be admitted and would like  to go home.  I think this is amenable.  I discussed with the patient strict return precautions such as chest pain, palpitations, shortness of breath, lightheadedness, fainting to return to the emergency department immediately.  Patient agrees to plan.  Patient is stable and being discharged home in good condition.  I discussed this case with my attending physician who cosigned this note including patient's presenting symptoms, physical exam, and planned diagnostics and interventions. Attending physician stated agreement with plan or made changes to plan which were implemented.      Sherrell Puller, PA-C 12/20/21 2337    Lucrezia Starch, MD 12/21/21 2115

## 2021-12-20 NOTE — ED Notes (Signed)
Pt requesting food.  EDP made aware.

## 2021-12-20 NOTE — ED Notes (Signed)
Pt noted to be back in afib with RVR. EKG performed, given to Ovid Curd, [place] for MD to sign.

## 2021-12-20 NOTE — ED Notes (Signed)
Patient transported to CT 

## 2021-12-20 NOTE — ED Notes (Signed)
Pt noted to be in SR. EKG given to MD. Dilt turned off per MD order.

## 2021-12-21 ENCOUNTER — Encounter (HOSPITAL_COMMUNITY): Payer: Self-pay | Admitting: Emergency Medicine

## 2021-12-21 ENCOUNTER — Emergency Department (HOSPITAL_COMMUNITY): Payer: Medicare Other

## 2021-12-21 ENCOUNTER — Telehealth: Payer: Self-pay | Admitting: Interventional Cardiology

## 2021-12-21 ENCOUNTER — Other Ambulatory Visit: Payer: Self-pay

## 2021-12-21 ENCOUNTER — Emergency Department (HOSPITAL_COMMUNITY)
Admission: EM | Admit: 2021-12-21 | Discharge: 2021-12-21 | Disposition: A | Discharge status: Home / Self Care | Payer: Medicare Other | Attending: Emergency Medicine | Admitting: Emergency Medicine

## 2021-12-21 DIAGNOSIS — Z79899 Other long term (current) drug therapy: Secondary | ICD-10-CM | POA: Diagnosis not present

## 2021-12-21 DIAGNOSIS — I10 Essential (primary) hypertension: Secondary | ICD-10-CM | POA: Diagnosis not present

## 2021-12-21 DIAGNOSIS — R000 Tachycardia, unspecified: Secondary | ICD-10-CM | POA: Diagnosis not present

## 2021-12-21 DIAGNOSIS — M5431 Sciatica, right side: Secondary | ICD-10-CM | POA: Diagnosis not present

## 2021-12-21 DIAGNOSIS — R519 Headache, unspecified: Secondary | ICD-10-CM | POA: Insufficient documentation

## 2021-12-21 DIAGNOSIS — G4489 Other headache syndrome: Secondary | ICD-10-CM | POA: Diagnosis not present

## 2021-12-21 LAB — BASIC METABOLIC PANEL
Anion gap: 10 (ref 5–15)
BUN: 20 mg/dL (ref 8–23)
CO2: 28 mmol/L (ref 22–32)
Calcium: 9 mg/dL (ref 8.9–10.3)
Chloride: 98 mmol/L (ref 98–111)
Creatinine, Ser: 1.21 mg/dL — ABNORMAL HIGH (ref 0.44–1.00)
GFR, Estimated: 43 mL/min — ABNORMAL LOW (ref >60)
Glucose, Bld: 163 mg/dL — ABNORMAL HIGH (ref 70–99)
Potassium: 3.6 mmol/L (ref 3.5–5.1)
Sodium: 136 mmol/L (ref 135–145)

## 2021-12-21 LAB — CBC WITH DIFFERENTIAL/PLATELET
Abs Immature Granulocytes: 0.05 10*3/uL (ref 0.00–0.07)
Basophils Absolute: 0.1 10*3/uL (ref 0.0–0.1)
Basophils Relative: 0 %
Eosinophils Absolute: 0.3 10*3/uL (ref 0.0–0.5)
Eosinophils Relative: 2 %
HCT: 39.3 % (ref 36.0–46.0)
Hemoglobin: 12.6 g/dL (ref 12.0–15.0)
Immature Granulocytes: 0 %
Lymphocytes Relative: 13 %
Lymphs Abs: 1.9 10*3/uL (ref 0.7–4.0)
MCH: 30.4 pg (ref 26.0–34.0)
MCHC: 32.1 g/dL (ref 30.0–36.0)
MCV: 94.7 fL (ref 80.0–100.0)
Monocytes Absolute: 1 10*3/uL (ref 0.1–1.0)
Monocytes Relative: 7 %
Neutro Abs: 11.6 10*3/uL — ABNORMAL HIGH (ref 1.7–7.7)
Neutrophils Relative %: 78 %
Platelets: 179 10*3/uL (ref 150–400)
RBC: 4.15 MIL/uL (ref 3.87–5.11)
RDW: 14.2 % (ref 11.5–15.5)
WBC: 14.9 10*3/uL — ABNORMAL HIGH (ref 4.0–10.5)
nRBC: 0 % (ref 0.0–0.2)

## 2021-12-21 MED ORDER — DILTIAZEM HCL ER COATED BEADS 120 MG PO CP24: 120.0000 mg | ORAL_CAPSULE | Freq: Two times a day (BID) | ORAL | 3 refills | Status: AC

## 2021-12-21 MED ORDER — DILTIAZEM HCL ER COATED BEADS 120 MG PO CP24
120.0000 mg | ORAL_CAPSULE | Freq: Once | ORAL | Status: AC
  Administered 2021-12-21: 120 mg via ORAL
  Filled 2021-12-21: qty 1

## 2021-12-21 NOTE — ED Triage Notes (Signed)
Patient arrived via [facility] from home with complaints of headache high BP since yesterday. Patient denies falling or hitting head. Patient on Eliquis. BP169/70 per EMS. Patient on 2L [place] on regular bases.

## 2021-12-21 NOTE — ED Provider Notes (Signed)
[facility] EMERGENCY DEPARTMENT  Provider Note  CSN: [id number] Arrival date & time: 12/21/21 1700  History Chief Complaint  Patient presents with   Hypertension    Sheryl Curtis is a 88 y.o. female with history of HTN and afib on Eliquis seen in the ED yesterday for rapid afib, converted spontaneously, had a recurrence then converted while on cardizem drip and eventually went home. She has had elevated BP readings at home today. Reports some chronic R sciatica discomfort. Had a mild headache which was "not enough to notice" earlier today but denies any pain at the time of my evaluation. No CP, SOB, N/V/D. Her daughter had called Cardiology clinic earlier today and was instructed to increase diltiazem to BID if BP remained elevated.    Home Medications Prior to Admission medications   Medication Sig Start Date End Date Taking? Authorizing Provider  albuterol (PROVENTIL) (2.5 MG/3ML) 0.083% nebulizer solution USE 1 VIAL VIA NEBULIZER EVERY 6 HOURS AS NEEDED Patient taking differently: Take 2.5 mg by nebulization in the morning and at bedtime. 02/20/21   Rigoberto Noel, MD  Albuterol Sulfate (PROAIR RESPICLICK) 213 (90 Base) MCG/ACT AEPB Inhale 1-2 puffs into the lungs every 6 (six) hours as needed (for shortness of breath and/or wheezing.). Patient taking differently: Inhale 1-2 puffs into the lungs in the morning and at bedtime. 10/31/20   Rigoberto Noel, MD  ALPRAZolam Duanne Moron) 0.25 MG tablet Take 0.25 mg by mouth 3 (three) times daily as needed for anxiety.    [provider]  amoxicillin-clavulanate (AUGMENTIN) 875-125 MG tablet Take 1 tablet by mouth 2 (two) times daily. Patient not taking: Reported on 12/20/2021 12/18/21   [provider]  apixaban (ELIQUIS) 5 MG TABS tablet TAKE 1 TABLET(5 MG) BY MOUTH TWICE DAILY Patient taking differently: Take 5 mg by mouth 2 (two) times daily. 12/03/21   Jettie Booze, MD  Budeson-Glycopyrrol-Formoterol  (BREZTRI AEROSPHERE) 160-9-4.8 MCG/ACT AERO Inhale 2 puffs into the lungs in the morning and at bedtime. 09/18/20   Lauraine Rinne, NP  cycloSPORINE (RESTASIS) 0.05 % ophthalmic emulsion Place 1 drop into both eyes 2 (two) times daily.    [provider]  diltiazem (CARDIZEM CD) 120 MG 24 hr capsule Take 1 capsule (120 mg total) by mouth in the morning and at bedtime. 12/21/21   Sherren Mocha, MD  DULoxetine (CYMBALTA) 60 MG capsule Take 60 mg by mouth daily. 09/25/17   [provider]  furosemide (LASIX) 40 MG tablet Take 0.5 tablets (20 mg total) by mouth daily. Check BMP follow-up with PCP before resuming Patient taking differently: Take 20 mg by mouth daily. 08/27/21   Antonieta Pert, MD  isosorbide mononitrate (IMDUR) 60 MG 24 hr tablet TAKE 1 TABLET BY MOUTH  DAILY Patient taking differently: 60 mg daily. 09/27/21   Jettie Booze, MD  levothyroxine (SYNTHROID) 25 MCG tablet Take 25 mcg by mouth daily before breakfast.    [provider]  Nebulizers (COMPRESSOR NEBULIZER) MISC 1 Device by Does not apply route once. Patient not taking: Reported on 12/20/2021 04/04/16   Deneise Lever, MD  nitroGLYCERIN (NITROSTAT) 0.4 MG SL tablet Place 1 tablet (0.4 mg total) under the tongue every 5 (five) minutes as needed for chest pain. 09/07/21   Jettie Booze, MD  polyethylene glycol powder [facility]) 17 GM/SCOOP powder Take 17 g by mouth daily.    [provider]  potassium chloride SA (KLOR-CON) 20 MEQ tablet Take 20 mEq  by mouth daily. 08/31/21   [provider]  psyllium (METAMUCIL) 58.6 % powder Take 1 packet by mouth daily as needed (cnstipation).    [provider]  Respiratory Therapy Supplies (FLUTTER) DEVI Use flutter device 3 times a day 07/22/18   Martyn Ehrich, NP  rosuvastatin (CRESTOR) 20 MG tablet TAKE 1 TABLET BY MOUTH  DAILY Patient taking differently: Take 20 mg by mouth daily. 11/23/20   Jettie Booze, MD   senna (SENOKOT) 8.6 MG TABS tablet Take 2 tablets (17.2 mg total) by mouth at bedtime. Patient taking differently: Take 2 tablets by mouth at bedtime as needed for mild constipation. 05/03/20   Raulkar, Clide Deutscher, MD  valsartan (DIOVAN) 160 MG tablet Take 160 mg by mouth daily. 10/13/21   [provider]     Allergies    Aspirin, Hydroxyquinolines, Imipramine hcl, Pamelor [nortriptyline hcl], Prednisone, Pregabalin, Procardia [nifedipine], Tramadol, Ace inhibitors, Hydroxyzine hcl, Influenza vaccines, Morphine and related, Oxycodone-acetaminophen, Gabapentin, and Hydroxychloroquine sulfate   Review of Systems   Review of Systems Please see HPI for pertinent positives and negatives  Physical Exam BP (!) 157/65    Pulse 93    Temp 98.2 F (36.8 C) (Oral)    Resp 17    Ht 5\' 4"  (1.626 m)    Wt 102.1 kg    SpO2 96%    BMI 38.62 kg/m   Physical Exam Vitals and nursing note reviewed.  Constitutional:      Appearance: Normal appearance.  HENT:     Head: Normocephalic and atraumatic.     Nose: Nose normal.     Mouth/Throat:     Mouth: Mucous membranes are moist.  Eyes:     Extraocular Movements: Extraocular movements intact.     Conjunctiva/sclera: Conjunctivae normal.  Cardiovascular:     Rate and Rhythm: Normal rate.  Pulmonary:     Effort: Pulmonary effort is normal.     Breath sounds: Normal breath sounds.  Abdominal:     General: Abdomen is flat.     Palpations: Abdomen is soft.     Tenderness: There is no abdominal tenderness.  Musculoskeletal:        General: No swelling. Normal range of motion.     Cervical back: Neck supple.  Skin:    General: Skin is warm and dry.  Neurological:     General: No focal deficit present.     Mental Status: She is alert.  Psychiatric:        Mood and Affect: Mood normal.    ED Results / Procedures / Treatments   EKG EKG Interpretation  Date/Time:  Friday December 21 2021 17:13:20 EST Ventricular Rate:  82 PR  Interval:  54 QRS Duration: 83 QT Interval:  395 QTC Calculation: 462 R Axis:   82 Text Interpretation: Sinus rhythm Atrial premature complexes Short PR interval Borderline right axis deviation Anteroseptal infarct, old No significant change since last tracing Confirmed by Calvert Cantor [phone]) on 12/21/2021 5:51:04 PM  Procedures Procedures  Medications Ordered in the ED Medications  diltiazem (CARDIZEM CD) 24 hr capsule 120 mg (120 mg Oral Given 12/21/21 2029)    Initial Impression and Plan  Patient with known pAF here with increased BP during the day today. She is essentially asymptomatic except for mild headache earlier today. Will check labs, CT, EKG and monitor. She is NSR on cardiac monitor.   ED Course   Clinical Course as of 12/21/21 2136  Fri Dec 21, 2021  1909 Given recommendations from outpatient Cardiology office today, will give an extra dose of her Cardizem while awaiting labs in the ED.  [CS]  1909 I personally viewed the images from radiology studies and agree with radiologist interpretation: No acute process.   [CS]  2039 CBC unchanged from yesterday.  [CS]  2104 BMP at baseline. BP improving. Recommend increased Cardizem as directed by Cardiology. Continue to monitor BP at home and follow up with Cardiology next week. Advised to return to the ED for further evaluation if she begins to have any concerning symptoms.  [CS]    Clinical Course User Index [CS] Truddie Hidden, MD     MDM Rules/Calculators/A&P Medical Decision Making Given presenting complaint, I considered that admission might be necessary. After review of results from ED lab and/or imaging studies, admission to the hospital is not indicated at this time.    Problems Addressed: Hypertension, unspecified type: acute illness or injury  Amount and/or Complexity of Data Reviewed Labs: ordered. Decision-making details documented in ED Course. Radiology: ordered and independent interpretation  performed. Decision-making details documented in ED Course. ECG/medicine tests: ordered and independent interpretation performed. Decision-making details documented in ED Course.  Risk Prescription drug management. Decision regarding hospitalization.    Final Clinical Impression(s) / ED Diagnoses Final diagnoses:  Hypertension, unspecified type    Rx / DC Orders ED Discharge Orders     None        Truddie Hidden, MD 12/21/21 2137

## 2021-12-21 NOTE — Telephone Encounter (Signed)
Patient's daughter Sheryl Curtis [facility]) is calling in about her mom going to the ER yesterday (12/20/21).   EMS transported for weakness chest pain and Afib heart rate ~ 120-170. They give IV Cardizem in the hospital to control HR. Patient converted to sinus with premature beats. Pending discharge, the patient returned to A-fib with RVR.  She was given a Cardizem bolus and was started on a Cardizem drip.  She was briefly on a Cardizem drip when she converted back to normal sinus rhythm.   The patient seem to be okay this morning (2/24) per daughter. No missed doses of medication. She is concerned about the patient going back in to Afib and if her blood pressure or heart rate medication needs to be adjusted. Asking for appt. Next available APP Bhagat 4/19 made. ED precautions given. Verbalized understanding.   VS pulled from Hospital : BP (!) 152/84    Curtis 79, BP (!) 160/79    Curtis 80  Will forward for MD on advisement for medication change or earlier appt. If needed.

## 2021-12-21 NOTE — Telephone Encounter (Signed)
Pt c/o of Chest Pain: STAT if CP now or developed within 24 hours  1. Are you having CP right now? No   2. Are you experiencing any other symptoms (ex. SOB, nausea, vomiting, sweating)? Sweating, lightheadedness,   3. How long have you been experiencing CP? Yesterday   4. Is your CP continuous or coming and going? Coming and going  5. Have you taken Nitroglycerin? No  ?

## 2021-12-21 NOTE — Telephone Encounter (Signed)
If BP is running high at home > 140 mm Hg consistently, would increase diltiazem to 120 mg BID.

## 2021-12-21 NOTE — Telephone Encounter (Signed)
Called patient's daughter [name] after speaking with DOD Burt Knack. Advised to increase Dilitazem 120 mg BID going forward. Follow up earlier than April (which is when app appt is now) with the [facility].   [name] stated that EMS were there and the patient's SBP is 220. EMS were taking her to the hospital. Made Dr. Burt Knack aware.

## 2021-12-21 NOTE — Telephone Encounter (Signed)
° °  Pt's daughter calling, she said, for the last 3 hours pt's BP is up to 200s  197/109 210/113 210/114 212/85  She would like to speak with RN again

## 2021-12-21 NOTE — Telephone Encounter (Signed)
Patients daughters do not take BP at home consistently at this time and what they do take they do not write down. Sheryl Curtis to take patients BP 2 hours after morning meds ( diltiazem, imdur, and lasix)  then once in the evening for Friday, Saturday, Sunday, and Monday. Asked to call Monday with BP and HR readings over the weekend. At that time we can determine if the patient needs a medication increase.  Verbalized understanding.

## 2021-12-21 NOTE — Discharge Instructions (Signed)
Please increase your Diltiazem (Cardizem CD) 120mg  to TWICE A DAY. Continue to monitor your blood pressure at home. Return to the ER if you begin having racing heart, chest pains, trouble breathing, nausea, vomiting or any other concerns.

## 2021-12-25 ENCOUNTER — Inpatient Hospital Stay: Admission: AD | Admit: 2021-12-25 | Payer: Medicare Other | Source: Ambulatory Visit | Admitting: Internal Medicine

## 2021-12-25 ENCOUNTER — Ambulatory Visit: Payer: Medicare Other | Admitting: Pulmonary Disease

## 2021-12-25 ENCOUNTER — Other Ambulatory Visit: Payer: Self-pay

## 2021-12-25 ENCOUNTER — Encounter: Payer: Self-pay | Admitting: Pulmonary Disease

## 2021-12-25 ENCOUNTER — Ambulatory Visit (INDEPENDENT_AMBULATORY_CARE_PROVIDER_SITE_OTHER): Payer: Medicare Other

## 2021-12-25 VITALS — BP 126/82 | HR 109 | Temp 98.0°F | Ht 64.0 in | Wt 219.0 lb

## 2021-12-25 DIAGNOSIS — J9621 Acute and chronic respiratory failure with hypoxia: Secondary | ICD-10-CM

## 2021-12-25 DIAGNOSIS — R0602 Shortness of breath: Secondary | ICD-10-CM | POA: Diagnosis not present

## 2021-12-25 DIAGNOSIS — J9611 Chronic respiratory failure with hypoxia: Secondary | ICD-10-CM | POA: Diagnosis not present

## 2021-12-25 DIAGNOSIS — J90 Pleural effusion, not elsewhere classified: Secondary | ICD-10-CM | POA: Diagnosis not present

## 2021-12-25 DIAGNOSIS — I517 Cardiomegaly: Secondary | ICD-10-CM | POA: Diagnosis not present

## 2021-12-25 LAB — CBC WITH DIFFERENTIAL/PLATELET
Basophils Absolute: 0.1 10*3/uL (ref 0.0–0.1)
Basophils Relative: 0.6 % (ref 0.0–3.0)
Eosinophils Absolute: 0.1 10*3/uL (ref 0.0–0.7)
Eosinophils Relative: 0.3 % (ref 0.0–5.0)
HCT: 39.9 % (ref 36.0–46.0)
Hemoglobin: 13.2 g/dL (ref 12.0–15.0)
Lymphocytes Relative: 6.6 % — ABNORMAL LOW (ref 12.0–46.0)
Lymphs Abs: 1 10*3/uL (ref 0.7–4.0)
MCHC: 33.1 g/dL (ref 30.0–36.0)
MCV: 89.4 fl (ref 78.0–100.0)
Monocytes Absolute: 0.9 10*3/uL (ref 0.1–1.0)
Monocytes Relative: 5.6 % (ref 3.0–12.0)
Neutro Abs: 13.6 10*3/uL — ABNORMAL HIGH (ref 1.4–7.7)
Neutrophils Relative %: 86.9 % — ABNORMAL HIGH (ref 43.0–77.0)
Platelets: 247 10*3/uL (ref 150.0–400.0)
RBC: 4.46 Mil/uL (ref 3.87–5.11)
RDW: 14.7 % (ref 11.5–15.5)
WBC: 15.6 10*3/uL — ABNORMAL HIGH (ref 4.0–10.5)

## 2021-12-25 LAB — COMPREHENSIVE METABOLIC PANEL
ALT: 16 U/L (ref 0–35)
AST: 20 U/L (ref 0–37)
Albumin: 4 g/dL (ref 3.5–5.2)
Alkaline Phosphatase: 55 U/L (ref 39–117)
BUN: 26 mg/dL — ABNORMAL HIGH (ref 6–23)
CO2: 30 mEq/L (ref 19–32)
Calcium: 9.5 mg/dL (ref 8.4–10.5)
Chloride: 96 mEq/L (ref 96–112)
Creatinine, Ser: 1.22 mg/dL — ABNORMAL HIGH (ref 0.40–1.20)
GFR: 39.78 mL/min — ABNORMAL LOW (ref >60.00)
Glucose, Bld: 134 mg/dL — ABNORMAL HIGH (ref 70–99)
Potassium: 3.4 mEq/L — ABNORMAL LOW (ref 3.5–5.1)
Sodium: 134 mEq/L — ABNORMAL LOW (ref 135–145)
Total Bilirubin: 0.5 mg/dL (ref 0.2–1.2)
Total Protein: 7.1 g/dL (ref 6.0–8.3)

## 2021-12-25 LAB — BRAIN NATRIURETIC PEPTIDE: Pro B Natriuretic peptide (BNP): 237 pg/mL — ABNORMAL HIGH (ref 0.0–100.0)

## 2021-12-25 MED ORDER — METHYLPREDNISOLONE ACETATE 80 MG/ML IJ SUSP
80.0000 mg | Freq: Once | INTRAMUSCULAR | Status: AC
  Administered 2021-12-25: 80 mg via INTRAMUSCULAR

## 2021-12-25 MED ORDER — IPRATROPIUM-ALBUTEROL 0.5-2.5 (3) MG/3ML IN SOLN
3.0000 mL | Freq: Once | RESPIRATORY_TRACT | Status: AC
  Administered 2021-12-25: 3 mL via RESPIRATORY_TRACT

## 2021-12-25 NOTE — Progress Notes (Signed)
Synopsis: Referred in February 2023 for acute visit, followed by Dr. Elsworth Soho  Subjective:   PATIENT ID: Sheryl Curtis GENDER: female DOB: [birth year], MRN: [id number]  HPI  Chief Complaint  Patient presents with   Acute Visit    Increased SOB over the past 3-4 days.    Sheryl Curtis is an 88 year old woman, former smoker with history of severe rheumatoid arthritis, GERD, CKD, atrial fibrillation, obesity, hypertension, chronic respiratory failure and asthma who returns to pulmonary clinic for shortness of breath.   She was seen by Dr. Elsworth Soho on 12/07/21 with no complaints of dyspnea at that time. She maintained her oxygen use and is using breztri with benefit. Her pulmonary nodules are being followed with serial CT Chest scans.   HRCT Chest 12/20/21 which show stable pulmonary nodules, largest measuring 23m in the RLL. Nodules are stable since 2018. Mild diffuse bronchial wall thickening with very mild centrilobular and paraseptal emphysema. No evidence of ILD. Moderate air trapping on expiratory film. Mild tracheobronchomalacia.   She went to the ER on 2/23 and treated for atrial fibrillation with RVR and again on 2/24 for elevated blood pressure. Her diltiazem 1259m24hr was changed to BID dosing by her cardiology team.   She was started on augmentin on 2/24 by her primary care provider for concern of sinus infection.   She has persistent shortness of breath and had difficulty sleeping last night due to dyspnea. She is having wheezing despite using her inhalers and nebulizer treatments. She is requiring more oxygen at 4L compared to 2-3L.  Past Medical History:  Diagnosis Date   ALLERGIC RHINITIS    Atrial fibrillation (HCC)    CANCER, COLORECTAL    CHEST PAIN, ATYPICAL    CKD (chronic kidney disease)    DYSPNEA    Essential hypertension, benign    FIBROMYALGIA    G E R D    History of stress test    Myoview 8/16:  EF 69%, anterior and apical defect c/w breast attenuation; Low Risk     INSOMNIA    Morbid obesity ([place]   Other and unspecified angina pectoris    Sleep apnea      Family History  Problem Relation Age of Onset   CAD Brother    Heart attack Brother [id number] Bone cancer Sister    Throat cancer Daughter      Social History   Socioeconomic History   Marital status: Widowed    Spouse name: Not on file   Number of children: 4   Years of education: Not on file   Highest education level: Not on file  Occupational History   Not on file  Tobacco Use   Smoking status: Former    Packs/day: 1.00    Years: 12.00    Pack years: 12.00    Types: Cigarettes    Start date: 10/28/1949    Quit date: 10/28/1962    Years since quitting: 59.2   Smokeless tobacco: Never  Vaping Use   Vaping Use: Never used  Substance and Sexual Activity   Alcohol use: No   Drug use: No   Sexual activity: Not on file  Other Topics Concern   Not on file  Social History Narrative   Not on file   Social Determinants of Health   Financial Resource Strain: Not on file  Food Insecurity: No Food Insecurity   Worried About Running Out of Food in the Last Year: Never true  Ran Out of Food in the Last Year: Never true  Transportation Needs: No Transportation Needs   Lack of Transportation (Medical): No   Lack of Transportation (Non-Medical): No  Physical Activity: Not on file  Stress: Not on file  Social Connections: Not on file  Intimate Partner Violence: Not At Risk   Fear of Current or Ex-Partner: No   Emotionally Abused: No   Physically Abused: No   Sexually Abused: No     Allergies  Allergen Reactions   Aspirin Anaphylaxis, Hives and Shortness Of Breath   Hydroxyquinolines Hives   Imipramine Hcl Other (See Comments)    Unknown reaction Other reaction(s): weakness/groggy   Pamelor [Nortriptyline Hcl] Other (See Comments)    Unknown reaction   Prednisone Shortness Of Breath, Itching and Other (See Comments)    anxiety   Pregabalin Other (See Comments)    Gives her  the shakes   Procardia [Nifedipine] Other (See Comments)    Unknown reaction   Tramadol Swelling   Ace Inhibitors Other (See Comments)    cough   Hydroxyzine Hcl Itching   Influenza Vaccines Other (See Comments)    High fever, couldn't get out of bed   Morphine And Related Other (See Comments)    Felt 'out of sorts'   Oxycodone-Acetaminophen     Other reaction(s): Brain fog   Gabapentin Anxiety   Hydroxychloroquine Sulfate Rash    Other reaction(s): pruritis     Outpatient Medications Prior to Visit  Medication Sig Dispense Refill   albuterol (PROVENTIL) (2.5 MG/3ML) 0.083% nebulizer solution USE 1 VIAL VIA NEBULIZER EVERY 6 HOURS AS NEEDED (Patient taking differently: Take 2.5 mg by nebulization in the morning and at bedtime.) 1080 mL 1   Albuterol Sulfate (PROAIR RESPICLICK) 078 (90 Base) MCG/ACT AEPB Inhale 1-2 puffs into the lungs every 6 (six) hours as needed (for shortness of breath and/or wheezing.). (Patient taking differently: Inhale 1-2 puffs into the lungs in the morning and at bedtime.) 1 each 6   ALPRAZolam (XANAX) 0.25 MG tablet Take 0.25 mg by mouth 3 (three) times daily as needed for anxiety.     amoxicillin-clavulanate (AUGMENTIN) 875-125 MG tablet Take 1 tablet by mouth 2 (two) times daily.     apixaban (ELIQUIS) 5 MG TABS tablet TAKE 1 TABLET(5 MG) BY MOUTH TWICE DAILY (Patient taking differently: Take 5 mg by mouth 2 (two) times daily.) 180 tablet 1   Budeson-Glycopyrrol-Formoterol (BREZTRI AEROSPHERE) 160-9-4.8 MCG/ACT AERO Inhale 2 puffs into the lungs in the morning and at bedtime. 5.9 g 0   cycloSPORINE (RESTASIS) 0.05 % ophthalmic emulsion Place 1 drop into both eyes 2 (two) times daily.     diltiazem (CARDIZEM CD) 120 MG 24 hr capsule Take 1 capsule (120 mg total) by mouth in the morning and at bedtime. 180 capsule 3   DULoxetine (CYMBALTA) 60 MG capsule Take 60 mg by mouth daily.     furosemide (LASIX) 40 MG tablet Take 0.5 tablets (20 mg total) by mouth daily.  Check BMP follow-up with PCP before resuming (Patient taking differently: Take 20 mg by mouth daily.)     isosorbide mononitrate (IMDUR) 60 MG 24 hr tablet TAKE 1 TABLET BY MOUTH  DAILY (Patient taking differently: 60 mg daily.) 90 tablet 3   levothyroxine (SYNTHROID) 25 MCG tablet Take 25 mcg by mouth daily before breakfast.     Nebulizers (COMPRESSOR NEBULIZER) MISC 1 Device by Does not apply route once. 1 each 0   nitroGLYCERIN (NITROSTAT) 0.4 MG SL  tablet Place 1 tablet (0.4 mg total) under the tongue every 5 (five) minutes as needed for chest pain. 25 tablet 3   polyethylene glycol powder (GLYCOLAX/MIRALAX) 17 GM/SCOOP powder Take 17 g by mouth daily.     potassium chloride SA (KLOR-CON) 20 MEQ tablet Take 20 mEq by mouth daily.     psyllium (METAMUCIL) 58.6 % powder Take 1 packet by mouth daily as needed (cnstipation).     Respiratory Therapy Supplies (FLUTTER) DEVI Use flutter device 3 times a day 1 each 0   rosuvastatin (CRESTOR) 20 MG tablet TAKE 1 TABLET BY MOUTH  DAILY (Patient taking differently: Take 20 mg by mouth daily.) 90 tablet 3   senna (SENOKOT) 8.6 MG TABS tablet Take 2 tablets (17.2 mg total) by mouth at bedtime. (Patient taking differently: Take 2 tablets by mouth at bedtime as needed for mild constipation.) 120 tablet 0   valsartan (DIOVAN) 160 MG tablet Take 160 mg by mouth daily.     No facility-administered medications prior to visit.   Review of Systems  Constitutional:  Negative for chills, fever, malaise/fatigue and weight loss.  HENT:  Positive for congestion. Negative for sinus pain and sore throat.   Eyes: Negative.   Respiratory:  Positive for cough, sputum production, shortness of breath and wheezing. Negative for hemoptysis.   Cardiovascular:  Positive for leg swelling. Negative for chest pain, palpitations, orthopnea and claudication.  Gastrointestinal:  Negative for abdominal pain, heartburn, nausea and vomiting.  Genitourinary: Negative.    Musculoskeletal:  Negative for joint pain and myalgias.  Skin:  Negative for rash.  Neurological:  Negative for weakness.  Endo/Heme/Allergies: Negative.   Psychiatric/Behavioral: Negative.     Objective:   Vitals:   12/25/21 1121  BP: 126/82  Pulse: (!) 109  Temp: 98 F (36.7 C)  TempSrc: Oral  SpO2: 95%  Weight: 219 lb (99.3 kg)  Height: _0  (1.626 m)   Physical Exam Constitutional:      General: She is in acute distress.     Appearance: She is obese.  HENT:     Head: Normocephalic and atraumatic.  Eyes:     General: No scleral icterus.    Conjunctiva/sclera: Conjunctivae normal.     Pupils: Pupils are equal, round, and reactive to light.  Cardiovascular:     Rate and Rhythm: Regular rhythm. Tachycardia present.     Pulses: Normal pulses.     Heart sounds: Normal heart sounds. No murmur heard. Pulmonary:     Effort: Pulmonary effort is normal. Tachypnea present.     Breath sounds: Examination of the right-lower field reveals rales. Examination of the left-lower field reveals rales. Decreased breath sounds and rales present. No wheezing or rhonchi.  Abdominal:     General: Bowel sounds are normal.     Palpations: Abdomen is soft.  Musculoskeletal:     Right lower leg: Edema present.     Left lower leg: Edema present.  Lymphadenopathy:     Cervical: No cervical adenopathy.  Skin:    General: Skin is warm and dry.  Neurological:     General: No focal deficit present.     Mental Status: She is alert.  Psychiatric:        Mood and Affect: Mood normal.        Behavior: Behavior normal.        Thought Content: Thought content normal.        Judgment: Judgment normal.   CBC    Component Value  Date/Time   WBC 14.9 (H) 12/21/2021 2023   RBC 4.15 12/21/2021 2023   HGB 12.6 12/21/2021 2023   HCT 39.3 12/21/2021 2023   PLT 179 12/21/2021 2023   MCV 94.7 12/21/2021 2023   MCH 30.4 12/21/2021 2023   MCHC 32.1 12/21/2021 2023   RDW 14.2 12/21/2021 2023    LYMPHSABS 1.9 12/21/2021 2023   MONOABS 1.0 12/21/2021 2023   EOSABS 0.3 12/21/2021 2023   BASOSABS 0.1 12/21/2021 2023   BMP Latest Ref Rng & Units 12/21/2021 12/20/2021 09/24/2021  Glucose 70 - 99 mg/dL 163(H) 134(H) 110(H)  BUN 8 - 23 mg/dL _0 Creatinine 0.44 - 1.00 mg/dL 1.21(H) 1.11(H) 1.04(H)  BUN/Creat Ratio 12 - 28 - - -  Sodium 135 - 145 mmol/L 136 135 137  Potassium 3.5 - 5.1 mmol/L 3.6 3.6 4.2  Chloride 98 - 111 mmol/L 98 96(L) 102  CO2 22 - 32 mmol/L _1 Calcium 8.9 - 10.3 mg/dL 9.0 9.1 9.1   Chest imaging: HRCT Chest 12/21/21 1. No findings to suggest interstitial lung disease. 2. Moderate air trapping indicative of small airways disease. 3. Mild tracheobronchomalacia. 4. Multiple small pulmonary nodules measuring 9 mm or less in size, stable in number and size compared to prior examinations, considered definitively benign. 5. Aortic atherosclerosis, in addition to left main and three-vessel coronary artery disease. 6. There are severe calcifications of the mitral annulus. Echocardiographic correlation for evaluation of potential valvular dysfunction may be warranted if clinically indicated.  PFT: PFT Results Latest Ref Rng & Units 12/09/2018 03/17/2017 01/10/2015  FVC-Pre L 1.76 1.98 1.97  FVC-Predicted Pre % 82 91 86  FVC-Post L 1.87 2.00 2.31  FVC-Predicted Post % 87 91 101  Pre FEV1/FVC % % 70 70 71  Post FEV1/FCV % % 72 76 71  FEV1-Pre L 1.24 1.40 1.41  FEV1-Predicted Pre % 79 87 84  FEV1-Post L 1.36 1.52 1.64  DLCO uncorrected ml/min/mmHg 17.07 20.63 19.78  DLCO UNC% % 100 98 94  DLCO corrected ml/min/mmHg 17.57 21.87 -  DLCO COR %Predicted % 103 104 -  DLVA Predicted % 108 118 100  TLC L 4.75 4.73 4.87  TLC % Predicted % 101 101 104  RV % Predicted % 122 117 116   Labs:  Path:  Echo: LV EF 65-70%. RV systolic function is normal. Mild thickening of mitral valve. Heart Catheterization:  Assessment & Plan:   Acute on chronic  respiratory failure with hypoxia (HCC) - Plan: Comp Met (CMET), CBC w/Diff, B Nat Peptide, DG Chest 2 View, B Nat Peptide, CBC w/Diff, Comp Met (CMET)  Discussion: Sheryl Curtis is an 88 year old woman, former smoker with history of severe rheumatoid arthritis, GERD, CKD, atrial fibrillation, obesity, hypertension, chronic respiratory failure and asthma who returns to pulmonary clinic for shortness of breath.   Patient has acute on chronic hypoxemic respiratory failure due to possible exacerbation of her asthma/COPD versus acute heart failure due to atrial fibrillation with pulmonary edema.  We will check CBC with differential, CMP and BNP along with a chest x-ray.  I have discussed case with Dr. Roosevelt Locks of the hospitalist team and she will be placed on the direct admit list for further evaluation and treatment.  I have instructed the patient that if her symptoms worsen she should go immediately to the ER.  We will give her 80 mg Solu-Medrol IM and DuoNeb nebulizer treatment for possible asthma/COPD involvement.  She has been covered with Augmentin  antibiotic therapy since 2/24.  Recommend checking influenza and COVID PCR testing along with extended respiratory viral PCR panel once admitted.  Follow-up with Dr. Elsworth Soho as needed.  65 minutes in total were spent on this visit including direct patient care, coordinating direct admission and completing documentation.   Freda Jackson, MD [facility] Office: [phone]   Current Outpatient Medications:    albuterol (PROVENTIL) (2.5 MG/3ML) 0.083% nebulizer solution, USE 1 VIAL VIA NEBULIZER EVERY 6 HOURS AS NEEDED (Patient taking differently: Take 2.5 mg by nebulization in the morning and at bedtime.), Disp: 1080 mL, Rfl: 1   Albuterol Sulfate (PROAIR RESPICLICK) 426 (90 Base) MCG/ACT AEPB, Inhale 1-2 puffs into the lungs every 6 (six) hours as needed (for shortness of breath and/or wheezing.). (Patient taking differently:  Inhale 1-2 puffs into the lungs in the morning and at bedtime.), Disp: 1 each, Rfl: 6   ALPRAZolam (XANAX) 0.25 MG tablet, Take 0.25 mg by mouth 3 (three) times daily as needed for anxiety., Disp: , Rfl:    amoxicillin-clavulanate (AUGMENTIN) 875-125 MG tablet, Take 1 tablet by mouth 2 (two) times daily., Disp: , Rfl:    apixaban (ELIQUIS) 5 MG TABS tablet, TAKE 1 TABLET(5 MG) BY MOUTH TWICE DAILY (Patient taking differently: Take 5 mg by mouth 2 (two) times daily.), Disp: 180 tablet, Rfl: 1   Budeson-Glycopyrrol-Formoterol (BREZTRI AEROSPHERE) 160-9-4.8 MCG/ACT AERO, Inhale 2 puffs into the lungs in the morning and at bedtime., Disp: 5.9 g, Rfl: 0   cycloSPORINE (RESTASIS) 0.05 % ophthalmic emulsion, Place 1 drop into both eyes 2 (two) times daily., Disp: , Rfl:    diltiazem (CARDIZEM CD) 120 MG 24 hr capsule, Take 1 capsule (120 mg total) by mouth in the morning and at bedtime., Disp: 180 capsule, Rfl: 3   DULoxetine (CYMBALTA) 60 MG capsule, Take 60 mg by mouth daily., Disp: , Rfl:    furosemide (LASIX) 40 MG tablet, Take 0.5 tablets (20 mg total) by mouth daily. Check BMP follow-up with PCP before resuming (Patient taking differently: Take 20 mg by mouth daily.), Disp: , Rfl:    isosorbide mononitrate (IMDUR) 60 MG 24 hr tablet, TAKE 1 TABLET BY MOUTH  DAILY (Patient taking differently: 60 mg daily.), Disp: 90 tablet, Rfl: 3   levothyroxine (SYNTHROID) 25 MCG tablet, Take 25 mcg by mouth daily before breakfast., Disp: , Rfl:    Nebulizers (COMPRESSOR NEBULIZER) MISC, 1 Device by Does not apply route once., Disp: 1 each, Rfl: 0   nitroGLYCERIN (NITROSTAT) 0.4 MG SL tablet, Place 1 tablet (0.4 mg total) under the tongue every 5 (five) minutes as needed for chest pain., Disp: 25 tablet, Rfl: 3   polyethylene glycol powder (GLYCOLAX/MIRALAX) 17 GM/SCOOP powder, Take 17 g by mouth daily., Disp: , Rfl:    potassium chloride SA (KLOR-CON) 20 MEQ tablet, Take 20 mEq by mouth daily., Disp: , Rfl:     psyllium (METAMUCIL) 58.6 % powder, Take 1 packet by mouth daily as needed (cnstipation)., Disp: , Rfl:    Respiratory Therapy Supplies (FLUTTER) DEVI, Use flutter device 3 times a day, Disp: 1 each, Rfl: 0   rosuvastatin (CRESTOR) 20 MG tablet, TAKE 1 TABLET BY MOUTH  DAILY (Patient taking differently: Take 20 mg by mouth daily.), Disp: 90 tablet, Rfl: 3

## 2021-12-25 NOTE — Patient Instructions (Addendum)
We will place a request to have you direct admitted to either [facility] or [facility].  We will check labs and chest x-ray today.  We will give you a duo-neb nebulizer treatment and 80mg  of depomedrol today.

## 2021-12-26 ENCOUNTER — Emergency Department (HOSPITAL_COMMUNITY): Payer: Medicare Other

## 2021-12-26 ENCOUNTER — Other Ambulatory Visit: Payer: Self-pay | Admitting: *Deleted

## 2021-12-26 ENCOUNTER — Inpatient Hospital Stay (HOSPITAL_COMMUNITY)
Admission: EM | Admit: 2021-12-26 | Discharge: 2022-01-26 | DRG: 193 | Disposition: E | Payer: Medicare Other | Attending: Family Medicine | Admitting: Family Medicine

## 2021-12-26 ENCOUNTER — Encounter (HOSPITAL_COMMUNITY): Payer: Self-pay | Admitting: Emergency Medicine

## 2021-12-26 ENCOUNTER — Telehealth: Payer: Self-pay | Admitting: Emergency Medicine

## 2021-12-26 ENCOUNTER — Inpatient Hospital Stay (HOSPITAL_COMMUNITY): Payer: Medicare Other

## 2021-12-26 DIAGNOSIS — Z90710 Acquired absence of both cervix and uterus: Secondary | ICD-10-CM

## 2021-12-26 DIAGNOSIS — T502X5A Adverse effect of carbonic-anhydrase inhibitors, benzothiadiazides and other diuretics, initial encounter: Secondary | ICD-10-CM | POA: Diagnosis not present

## 2021-12-26 DIAGNOSIS — J9690 Respiratory failure, unspecified, unspecified whether with hypoxia or hypercapnia: Secondary | ICD-10-CM | POA: Diagnosis not present

## 2021-12-26 DIAGNOSIS — N1832 Chronic kidney disease, stage 3b: Secondary | ICD-10-CM | POA: Diagnosis not present

## 2021-12-26 DIAGNOSIS — R0603 Acute respiratory distress: Secondary | ICD-10-CM | POA: Diagnosis present

## 2021-12-26 DIAGNOSIS — J9601 Acute respiratory failure with hypoxia: Secondary | ICD-10-CM | POA: Diagnosis present

## 2021-12-26 DIAGNOSIS — I4819 Other persistent atrial fibrillation: Secondary | ICD-10-CM | POA: Diagnosis not present

## 2021-12-26 DIAGNOSIS — Z955 Presence of coronary angioplasty implant and graft: Secondary | ICD-10-CM | POA: Diagnosis not present

## 2021-12-26 DIAGNOSIS — E1122 Type 2 diabetes mellitus with diabetic chronic kidney disease: Secondary | ICD-10-CM | POA: Diagnosis present

## 2021-12-26 DIAGNOSIS — M797 Fibromyalgia: Secondary | ICD-10-CM | POA: Diagnosis present

## 2021-12-26 DIAGNOSIS — T380X5A Adverse effect of glucocorticoids and synthetic analogues, initial encounter: Secondary | ICD-10-CM | POA: Diagnosis present

## 2021-12-26 DIAGNOSIS — I130 Hypertensive heart and chronic kidney disease with heart failure and stage 1 through stage 4 chronic kidney disease, or unspecified chronic kidney disease: Secondary | ICD-10-CM | POA: Diagnosis present

## 2021-12-26 DIAGNOSIS — I4891 Unspecified atrial fibrillation: Secondary | ICD-10-CM

## 2021-12-26 DIAGNOSIS — E871 Hypo-osmolality and hyponatremia: Secondary | ICD-10-CM | POA: Diagnosis not present

## 2021-12-26 DIAGNOSIS — Z7901 Long term (current) use of anticoagulants: Secondary | ICD-10-CM | POA: Diagnosis not present

## 2021-12-26 DIAGNOSIS — Z515 Encounter for palliative care: Secondary | ICD-10-CM

## 2021-12-26 DIAGNOSIS — I480 Paroxysmal atrial fibrillation: Secondary | ICD-10-CM

## 2021-12-26 DIAGNOSIS — J4531 Mild persistent asthma with (acute) exacerbation: Secondary | ICD-10-CM | POA: Diagnosis present

## 2021-12-26 DIAGNOSIS — M549 Dorsalgia, unspecified: Secondary | ICD-10-CM | POA: Diagnosis present

## 2021-12-26 DIAGNOSIS — Z79890 Hormone replacement therapy (postmenopausal): Secondary | ICD-10-CM

## 2021-12-26 DIAGNOSIS — I248 Other forms of acute ischemic heart disease: Secondary | ICD-10-CM | POA: Diagnosis not present

## 2021-12-26 DIAGNOSIS — J449 Chronic obstructive pulmonary disease, unspecified: Secondary | ICD-10-CM | POA: Diagnosis present

## 2021-12-26 DIAGNOSIS — R0689 Other abnormalities of breathing: Secondary | ICD-10-CM | POA: Diagnosis not present

## 2021-12-26 DIAGNOSIS — N179 Acute kidney failure, unspecified: Secondary | ICD-10-CM | POA: Diagnosis not present

## 2021-12-26 DIAGNOSIS — K219 Gastro-esophageal reflux disease without esophagitis: Secondary | ICD-10-CM | POA: Diagnosis not present

## 2021-12-26 DIAGNOSIS — Z886 Allergy status to analgesic agent status: Secondary | ICD-10-CM

## 2021-12-26 DIAGNOSIS — R627 Adult failure to thrive: Secondary | ICD-10-CM | POA: Diagnosis present

## 2021-12-26 DIAGNOSIS — Z888 Allergy status to other drugs, medicaments and biological substances status: Secondary | ICD-10-CM

## 2021-12-26 DIAGNOSIS — J9600 Acute respiratory failure, unspecified whether with hypoxia or hypercapnia: Secondary | ICD-10-CM

## 2021-12-26 DIAGNOSIS — R6889 Other general symptoms and signs: Secondary | ICD-10-CM | POA: Diagnosis not present

## 2021-12-26 DIAGNOSIS — J9621 Acute and chronic respiratory failure with hypoxia: Secondary | ICD-10-CM

## 2021-12-26 DIAGNOSIS — Z66 Do not resuscitate: Secondary | ICD-10-CM | POA: Diagnosis present

## 2021-12-26 DIAGNOSIS — Z85038 Personal history of other malignant neoplasm of large intestine: Secondary | ICD-10-CM | POA: Diagnosis not present

## 2021-12-26 DIAGNOSIS — T17908A Unspecified foreign body in respiratory tract, part unspecified causing other injury, initial encounter: Secondary | ICD-10-CM | POA: Diagnosis not present

## 2021-12-26 DIAGNOSIS — J189 Pneumonia, unspecified organism: Secondary | ICD-10-CM

## 2021-12-26 DIAGNOSIS — G4733 Obstructive sleep apnea (adult) (pediatric): Secondary | ICD-10-CM | POA: Diagnosis not present

## 2021-12-26 DIAGNOSIS — Z8249 Family history of ischemic heart disease and other diseases of the circulatory system: Secondary | ICD-10-CM

## 2021-12-26 DIAGNOSIS — I2510 Atherosclerotic heart disease of native coronary artery without angina pectoris: Secondary | ICD-10-CM | POA: Diagnosis not present

## 2021-12-26 DIAGNOSIS — I517 Cardiomegaly: Secondary | ICD-10-CM | POA: Diagnosis not present

## 2021-12-26 DIAGNOSIS — Z743 Need for continuous supervision: Secondary | ICD-10-CM | POA: Diagnosis not present

## 2021-12-26 DIAGNOSIS — Z87891 Personal history of nicotine dependence: Secondary | ICD-10-CM

## 2021-12-26 DIAGNOSIS — Z6837 Body mass index (BMI) 37.0-37.9, adult: Secondary | ICD-10-CM

## 2021-12-26 DIAGNOSIS — R682 Dry mouth, unspecified: Secondary | ICD-10-CM | POA: Diagnosis present

## 2021-12-26 DIAGNOSIS — Y92239 Unspecified place in hospital as the place of occurrence of the external cause: Secondary | ICD-10-CM | POA: Diagnosis present

## 2021-12-26 DIAGNOSIS — Z808 Family history of malignant neoplasm of other organs or systems: Secondary | ICD-10-CM

## 2021-12-26 DIAGNOSIS — N183 Chronic kidney disease, stage 3 unspecified: Secondary | ICD-10-CM | POA: Diagnosis present

## 2021-12-26 DIAGNOSIS — Z20822 Contact with and (suspected) exposure to covid-19: Secondary | ICD-10-CM | POA: Diagnosis not present

## 2021-12-26 DIAGNOSIS — I5033 Acute on chronic diastolic (congestive) heart failure: Secondary | ICD-10-CM | POA: Diagnosis not present

## 2021-12-26 DIAGNOSIS — E876 Hypokalemia: Secondary | ICD-10-CM

## 2021-12-26 DIAGNOSIS — R0602 Shortness of breath: Secondary | ICD-10-CM | POA: Diagnosis not present

## 2021-12-26 DIAGNOSIS — Z993 Dependence on wheelchair: Secondary | ICD-10-CM

## 2021-12-26 DIAGNOSIS — R5381 Other malaise: Secondary | ICD-10-CM | POA: Diagnosis present

## 2021-12-26 DIAGNOSIS — Z885 Allergy status to narcotic agent status: Secondary | ICD-10-CM

## 2021-12-26 DIAGNOSIS — M069 Rheumatoid arthritis, unspecified: Secondary | ICD-10-CM | POA: Diagnosis not present

## 2021-12-26 DIAGNOSIS — G8929 Other chronic pain: Secondary | ICD-10-CM | POA: Diagnosis present

## 2021-12-26 DIAGNOSIS — I499 Cardiac arrhythmia, unspecified: Secondary | ICD-10-CM | POA: Diagnosis not present

## 2021-12-26 DIAGNOSIS — I10 Essential (primary) hypertension: Secondary | ICD-10-CM | POA: Diagnosis present

## 2021-12-26 DIAGNOSIS — Z79899 Other long term (current) drug therapy: Secondary | ICD-10-CM

## 2021-12-26 HISTORY — DX: Chronic kidney disease, stage 3b: N18.32

## 2021-12-26 HISTORY — DX: Paroxysmal atrial fibrillation: I48.0

## 2021-12-26 HISTORY — DX: Other nonspecific abnormal finding of lung field: R91.8

## 2021-12-26 HISTORY — DX: Chronic respiratory failure, unspecified whether with hypoxia or hypercapnia: J96.10

## 2021-12-26 HISTORY — DX: Rheumatoid arthritis, unspecified: M06.9

## 2021-12-26 HISTORY — DX: Unspecified asthma, uncomplicated: J45.909

## 2021-12-26 HISTORY — DX: Atherosclerotic heart disease of native coronary artery without angina pectoris: I25.10

## 2021-12-26 LAB — COMPREHENSIVE METABOLIC PANEL
ALT: 22 U/L (ref 0–44)
AST: 25 U/L (ref 15–41)
Albumin: 3.9 g/dL (ref 3.5–5.0)
Alkaline Phosphatase: 55 U/L (ref 38–126)
Anion gap: 9 (ref 5–15)
BUN: 21 mg/dL (ref 8–23)
CO2: 28 mmol/L (ref 22–32)
Calcium: 8.9 mg/dL (ref 8.9–10.3)
Chloride: 96 mmol/L — ABNORMAL LOW (ref 98–111)
Creatinine, Ser: 1.23 mg/dL — ABNORMAL HIGH (ref 0.44–1.00)
GFR, Estimated: 43 mL/min — ABNORMAL LOW (ref >60)
Glucose, Bld: 156 mg/dL — ABNORMAL HIGH (ref 70–99)
Potassium: 3.2 mmol/L — ABNORMAL LOW (ref 3.5–5.1)
Sodium: 133 mmol/L — ABNORMAL LOW (ref 135–145)
Total Bilirubin: 0.5 mg/dL (ref 0.3–1.2)
Total Protein: 7.2 g/dL (ref 6.5–8.1)

## 2021-12-26 LAB — CBC WITH DIFFERENTIAL/PLATELET
Abs Immature Granulocytes: 0.07 10*3/uL (ref 0.00–0.07)
Basophils Absolute: 0.1 10*3/uL (ref 0.0–0.1)
Basophils Relative: 0 %
Eosinophils Absolute: 0.1 10*3/uL (ref 0.0–0.5)
Eosinophils Relative: 0 %
HCT: 40.6 % (ref 36.0–46.0)
Hemoglobin: 13.4 g/dL (ref 12.0–15.0)
Immature Granulocytes: 0 %
Lymphocytes Relative: 13 %
Lymphs Abs: 2.4 10*3/uL (ref 0.7–4.0)
MCH: 30.2 pg (ref 26.0–34.0)
MCHC: 33 g/dL (ref 30.0–36.0)
MCV: 91.6 fL (ref 80.0–100.0)
Monocytes Absolute: 1.3 10*3/uL — ABNORMAL HIGH (ref 0.1–1.0)
Monocytes Relative: 7 %
Neutro Abs: 14.5 10*3/uL — ABNORMAL HIGH (ref 1.7–7.7)
Neutrophils Relative %: 80 %
Platelets: 251 10*3/uL (ref 150–400)
RBC: 4.43 MIL/uL (ref 3.87–5.11)
RDW: 14.7 % (ref 11.5–15.5)
WBC: 18.3 10*3/uL — ABNORMAL HIGH (ref 4.0–10.5)
nRBC: 0 % (ref 0.0–0.2)

## 2021-12-26 LAB — LACTIC ACID, PLASMA
Lactic Acid, Venous: 2 mmol/L (ref 0.5–1.9)
Lactic Acid, Venous: 1.9 mmol/L (ref 0.5–1.9)

## 2021-12-26 LAB — MRSA NEXT GEN BY PCR, NASAL: MRSA by PCR Next Gen: NOT DETECTED

## 2021-12-26 LAB — BRAIN NATRIURETIC PEPTIDE: B Natriuretic Peptide: 237.3 pg/mL — ABNORMAL HIGH (ref 0.0–100.0)

## 2021-12-26 LAB — PROCALCITONIN: Procalcitonin: <0.1 ng/mL

## 2021-12-26 LAB — ECHOCARDIOGRAM COMPLETE
AR max vel: 1.65 cm2
AV Area VTI: 1.71 cm2
AV Area mean vel: 1.76 cm2
AV Mean grad: 3 mmHg
AV Peak grad: 6.1 mmHg
Ao pk vel: 1.23 m/s
Calc EF: 45.2 %
S' Lateral: 2.5 cm
Single Plane A2C EF: 47.9 %
Single Plane A4C EF: 43.5 %

## 2021-12-26 LAB — TROPONIN I (HIGH SENSITIVITY)
Troponin I (High Sensitivity): 44 ng/L — ABNORMAL HIGH (ref <18)
Troponin I (High Sensitivity): 30 ng/L — ABNORMAL HIGH (ref <18)

## 2021-12-26 LAB — RESP PANEL BY RT-PCR (FLU A&B, COVID) ARPGX2
Influenza A by PCR: NEGATIVE
Influenza B by PCR: NEGATIVE
SARS Coronavirus 2 by RT PCR: NEGATIVE

## 2021-12-26 LAB — MAGNESIUM: Magnesium: 2.6 mg/dL — ABNORMAL HIGH (ref 1.7–2.4)

## 2021-12-26 LAB — TSH: TSH: 2.255 u[IU]/mL (ref 0.350–4.500)

## 2021-12-26 MED ORDER — PERFLUTREN LIPID MICROSPHERE
1.0000 mL | INTRAVENOUS | Status: AC | PRN
  Administered 2021-12-26: 2 mL via INTRAVENOUS
  Filled 2021-12-26: qty 10

## 2021-12-26 MED ORDER — SODIUM CHLORIDE 0.9 % IV SOLN
1.0000 g | INTRAVENOUS | Status: DC
  Administered 2021-12-27 – 2021-12-28 (×2): 1 g via INTRAVENOUS
  Filled 2021-12-26 (×3): qty 10

## 2021-12-26 MED ORDER — ONDANSETRON HCL 4 MG/2ML IJ SOLN
4.0000 mg | Freq: Four times a day (QID) | INTRAMUSCULAR | Status: DC | PRN
  Administered 2021-12-26: 4 mg via INTRAVENOUS
  Filled 2021-12-26: qty 2

## 2021-12-26 MED ORDER — AMIODARONE HCL IN DEXTROSE 360-4.14 MG/200ML-% IV SOLN
30.0000 mg/h | INTRAVENOUS | Status: DC
  Administered 2021-12-26 – 2021-12-28 (×4): 30 mg/h via INTRAVENOUS
  Filled 2021-12-26 (×3): qty 200

## 2021-12-26 MED ORDER — METHYLPREDNISOLONE SODIUM SUCC 40 MG IJ SOLR
40.0000 mg | Freq: Three times a day (TID) | INTRAMUSCULAR | Status: DC
Start: 2021-12-26 — End: 2021-12-27
  Administered 2021-12-26 – 2021-12-27 (×3): 40 mg via INTRAVENOUS
  Filled 2021-12-26 (×3): qty 1

## 2021-12-26 MED ORDER — POTASSIUM CHLORIDE CRYS ER 20 MEQ PO TBCR
40.0000 meq | EXTENDED_RELEASE_TABLET | Freq: Once | ORAL | Status: AC
  Administered 2021-12-26: 40 meq via ORAL
  Filled 2021-12-26: qty 2

## 2021-12-26 MED ORDER — SENNOSIDES-DOCUSATE SODIUM 8.6-50 MG PO TABS
1.0000 | ORAL_TABLET | Freq: Every evening | ORAL | Status: DC | PRN
Start: 2021-12-26 — End: 2021-12-29
  Administered 2021-12-27: 1 via ORAL
  Filled 2021-12-26: qty 1

## 2021-12-26 MED ORDER — ACETAMINOPHEN 325 MG PO TABS
650.0000 mg | ORAL_TABLET | Freq: Once | ORAL | Status: AC
  Administered 2021-12-26: 650 mg via ORAL
  Filled 2021-12-26: qty 2

## 2021-12-26 MED ORDER — BUDESONIDE 0.5 MG/2ML IN SUSP
0.5000 mg | Freq: Two times a day (BID) | RESPIRATORY_TRACT | Status: DC
Start: 2021-12-26 — End: 2021-12-29
  Administered 2021-12-26 – 2021-12-28 (×5): 0.5 mg via RESPIRATORY_TRACT
  Filled 2021-12-26 (×5): qty 2

## 2021-12-26 MED ORDER — POTASSIUM CHLORIDE 20 MEQ PO PACK
40.0000 meq | PACK | Freq: Once | ORAL | Status: AC
  Administered 2021-12-26: 40 meq via ORAL
  Filled 2021-12-26: qty 2

## 2021-12-26 MED ORDER — GUAIFENESIN 100 MG/5ML PO LIQD: 5.0000 mL | ORAL | Status: DC | PRN

## 2021-12-26 MED ORDER — METOPROLOL TARTRATE 5 MG/5ML IV SOLN
5.0000 mg | INTRAVENOUS | Status: DC | PRN
  Administered 2021-12-27: 5 mg via INTRAVENOUS
  Filled 2021-12-26: qty 5

## 2021-12-26 MED ORDER — DILTIAZEM HCL 60 MG PO TABS
60.0000 mg | ORAL_TABLET | Freq: Three times a day (TID) | ORAL | Status: DC
  Administered 2021-12-26 – 2021-12-27 (×3): 60 mg via ORAL
  Filled 2021-12-26 (×3): qty 1

## 2021-12-26 MED ORDER — FUROSEMIDE 10 MG/ML IJ SOLN
40.0000 mg | Freq: Two times a day (BID) | INTRAMUSCULAR | Status: DC
  Administered 2021-12-26 – 2021-12-28 (×4): 40 mg via INTRAVENOUS
  Filled 2021-12-26 (×4): qty 4

## 2021-12-26 MED ORDER — SODIUM CHLORIDE 0.9 % IV SOLN
500.0000 mg | INTRAVENOUS | Status: DC
  Administered 2021-12-26 – 2021-12-28 (×3): 500 mg via INTRAVENOUS
  Filled 2021-12-26 (×3): qty 5

## 2021-12-26 MED ORDER — DILTIAZEM LOAD VIA INFUSION
10.0000 mg | Freq: Once | INTRAVENOUS | Status: AC
  Administered 2021-12-26: 10 mg via INTRAVENOUS
  Filled 2021-12-26: qty 10

## 2021-12-26 MED ORDER — DILTIAZEM HCL 25 MG/5ML IV SOLN
10.0000 mg | Freq: Once | INTRAVENOUS | Status: AC
  Administered 2021-12-26: 10 mg via INTRAVENOUS
  Filled 2021-12-26: qty 5

## 2021-12-26 MED ORDER — HYDRALAZINE HCL 20 MG/ML IJ SOLN
10.0000 mg | INTRAMUSCULAR | Status: DC | PRN
  Administered 2021-12-27 (×3): 10 mg via INTRAVENOUS
  Filled 2021-12-26 (×3): qty 1

## 2021-12-26 MED ORDER — ALPRAZOLAM 0.25 MG PO TABS
0.2500 mg | ORAL_TABLET | Freq: Three times a day (TID) | ORAL | Status: DC | PRN
  Administered 2021-12-26: 0.25 mg via ORAL
  Filled 2021-12-26: qty 1

## 2021-12-26 MED ORDER — DILTIAZEM LOAD VIA INFUSION
20.0000 mg | Freq: Once | INTRAVENOUS | Status: DC
  Filled 2021-12-26: qty 20

## 2021-12-26 MED ORDER — APIXABAN 5 MG PO TABS
5.0000 mg | ORAL_TABLET | Freq: Two times a day (BID) | ORAL | Status: DC
  Administered 2021-12-26 (×2): 5 mg via ORAL
  Filled 2021-12-26 (×2): qty 1

## 2021-12-26 MED ORDER — LABETALOL HCL 5 MG/ML IV SOLN
5.0000 mg | Freq: Once | INTRAVENOUS | Status: AC
Start: 2021-12-26 — End: 2021-12-26
  Administered 2021-12-26: 5 mg via INTRAVENOUS
  Filled 2021-12-26: qty 4

## 2021-12-26 MED ORDER — AZITHROMYCIN 250 MG PO TABS: 500.0000 mg | ORAL_TABLET | Freq: Once | ORAL | Status: DC

## 2021-12-26 MED ORDER — DILTIAZEM HCL-DEXTROSE 125-5 MG/125ML-% IV SOLN (PREMIX): 5.0000 mg/h | INTRAVENOUS | Status: DC

## 2021-12-26 MED ORDER — AMIODARONE LOAD VIA INFUSION
150.0000 mg | Freq: Once | INTRAVENOUS | Status: AC
  Administered 2021-12-26: 150 mg via INTRAVENOUS
  Filled 2021-12-26: qty 83.34

## 2021-12-26 MED ORDER — ISOSORBIDE MONONITRATE ER 60 MG PO TB24
60.0000 mg | ORAL_TABLET | Freq: Every day | ORAL | Status: DC
  Administered 2021-12-26: 60 mg via ORAL
  Filled 2021-12-26: qty 1

## 2021-12-26 MED ORDER — ONDANSETRON HCL 4 MG PO TABS: 4.0000 mg | ORAL_TABLET | Freq: Four times a day (QID) | ORAL | Status: DC | PRN

## 2021-12-26 MED ORDER — CHLORHEXIDINE GLUCONATE CLOTH 2 % EX PADS
6.0000 | MEDICATED_PAD | Freq: Every day | CUTANEOUS | Status: DC
  Administered 2021-12-26 – 2021-12-28 (×3): 6 via TOPICAL

## 2021-12-26 MED ORDER — ORAL CARE MOUTH RINSE
15.0000 mL | Freq: Two times a day (BID) | OROMUCOSAL | Status: DC
  Administered 2021-12-26 – 2021-12-27 (×3): 15 mL via OROMUCOSAL

## 2021-12-26 MED ORDER — DILTIAZEM HCL-DEXTROSE 125-5 MG/125ML-% IV SOLN (PREMIX)
5.0000 mg/h | INTRAVENOUS | Status: DC
  Administered 2021-12-26: 15 mg/h via INTRAVENOUS
  Administered 2021-12-26: 10 mg/h via INTRAVENOUS
  Administered 2021-12-26: 5 mg/h via INTRAVENOUS
  Administered 2021-12-27: 15 mg/h via INTRAVENOUS
  Administered 2021-12-27: 10 mg/h via INTRAVENOUS
  Administered 2021-12-28: 15 mg/h via INTRAVENOUS
  Filled 2021-12-26 (×6): qty 125

## 2021-12-26 MED ORDER — SODIUM CHLORIDE 0.9 % IV SOLN
1.0000 g | Freq: Once | INTRAVENOUS | Status: AC
  Administered 2021-12-26: 1 g via INTRAVENOUS
  Filled 2021-12-26: qty 10

## 2021-12-26 MED ORDER — ROSUVASTATIN CALCIUM 20 MG PO TABS: 20.0000 mg | ORAL_TABLET | Freq: Every day | ORAL | Status: DC

## 2021-12-26 MED ORDER — IPRATROPIUM-ALBUTEROL 0.5-2.5 (3) MG/3ML IN SOLN
3.0000 mL | RESPIRATORY_TRACT | Status: DC | PRN
  Administered 2021-12-27: 3 mL via RESPIRATORY_TRACT
  Filled 2021-12-26: qty 3

## 2021-12-26 MED ORDER — AMIODARONE HCL IN DEXTROSE 360-4.14 MG/200ML-% IV SOLN
60.0000 mg/h | INTRAVENOUS | Status: AC
  Administered 2021-12-26 (×2): 60 mg/h via INTRAVENOUS
  Filled 2021-12-26 (×2): qty 200

## 2021-12-26 MED ORDER — LEVOTHYROXINE SODIUM 25 MCG PO TABS
25.0000 ug | ORAL_TABLET | Freq: Every day | ORAL | Status: DC
  Administered 2021-12-27: 25 ug via ORAL
  Filled 2021-12-26: qty 1

## 2021-12-26 MED ORDER — FUROSEMIDE 10 MG/ML IJ SOLN
40.0000 mg | Freq: Once | INTRAMUSCULAR | Status: AC
Start: 2021-12-26 — End: 2021-12-26
  Administered 2021-12-26: 40 mg via INTRAVENOUS
  Filled 2021-12-26: qty 4

## 2021-12-26 MED ORDER — DULOXETINE HCL 30 MG PO CPEP
60.0000 mg | ORAL_CAPSULE | Freq: Every day | ORAL | Status: DC
Start: 2021-12-26 — End: 2021-12-29

## 2021-12-26 MED ORDER — IPRATROPIUM-ALBUTEROL 0.5-2.5 (3) MG/3ML IN SOLN
3.0000 mL | Freq: Four times a day (QID) | RESPIRATORY_TRACT | Status: DC
  Administered 2021-12-26 – 2021-12-28 (×10): 3 mL via RESPIRATORY_TRACT
  Filled 2021-12-26 (×10): qty 3

## 2021-12-26 NOTE — Consult Note (Signed)
Name: Sheryl Curtis MRN: [id number] DOB: 11/05/1931    ADMISSION DATE:  01/01/2022 CONSULTATION DATE:  01/15/2022   REFERRING MD :  Karle Starch, EDP  CHIEF COMPLAINT: Respiratory distress   HISTORY OF PRESENT ILLNESS: 88 year old remote smoker was followed in the office for 'COPD', OSA and chronic respiratory failure requiring 2 L of oxygen. She has declined over the past few months.  She is also WBA chronic back pain and arrives in a wheelchair for the  last few visits. Surprisingly PFTs have not shown significant airway obstruction HRCT in the past has not shown any sign of ILD  She was sent to the ER on 2/23 treated for atrial fibrillation with RVR again on 2/23 treated for elevated blood pressure.  Cardiology changed Cardizem to 120 twice daily. She was started on Augmentin by PCP for sinus infection in 2/24.  She was seen in the pulmonary clinic by my partner 2/28 for shortness of breath and requiring 4 L which is worse than her baseline 2 L .  Hospitalization was advised as a direct admit.  She was given Solu-Medrol 80 and DuoNebs  She was brought in by EMS this morning for worsening hypoxia and placed on BiPAP in the emergency room due to increased work of breathing She received 125 Solu-Medrol by EMS in route with nebs Initial testing showed BNP 237, leukocytosis 16 K, COVID flu was negative. Chest x-ray independently reviewed shows bibasilar pneumonia  Blood pressure was, highest 205/176, started on Cardizem drip for atrial fibrillation/RVR.  Given 5 of labetalol and 40 of IV Lasix  PAST MEDICAL HISTORY :  severe rheumatoid arthritis, GERD, CKD, atrial fibrillation, obesity, hypertension  PFTs - 12/2014 - no airway obstruction, ratio 71, pos BD response PFTs 11/2018, and mild airway obstruction with FEV1 79%, TLC 101%, DLCO normal, no bronchodilator response HRCT 12/2020 no ILD, minimal increase in size of multiple pulm nodules   Past Medical History:  Diagnosis Date   ALLERGIC  RHINITIS    Asthma    CAD (coronary artery disease)    CANCER, COLORECTAL    Chronic kidney disease, stage 3b (HCC)    Chronic respiratory failure (HCC)    CKD (chronic kidney disease)    Essential hypertension, benign    FIBROMYALGIA    G E R D    INSOMNIA    Morbid obesity (HCC)    PAF (paroxysmal atrial fibrillation) (HCC)    Pulmonary nodules    Rheumatoid arthritis ([place])    Sleep apnea    Past Surgical History:  Procedure Laterality Date   ABDOMINAL HYSTERECTOMY     ANKLE FRACTURE SURGERY     BACK SURGERY     BREAST SURGERY     COLON SURGERY     CORONARY STENT PLACEMENT     PERCUTANEOUS CORONARY STENT INTERVENTION (PCI-S) N/A 05/19/2013   Procedure: PERCUTANEOUS CORONARY STENT INTERVENTION (PCI-S);  Surgeon: Jettie Booze, MD;  Location: [facility] CATH LAB;  Service: Cardiovascular;  Laterality: N/A;   Prior to Admission medications   Medication Sig Start Date End Date Taking? Authorizing Provider  albuterol (PROVENTIL) (2.5 MG/3ML) 0.083% nebulizer solution USE 1 VIAL VIA NEBULIZER EVERY 6 HOURS AS NEEDED Patient taking differently: Take 2.5 mg by nebulization in the morning and at bedtime. 02/20/21   Rigoberto Noel, MD  Albuterol Sulfate (PROAIR RESPICLICK) 601 (90 Base) MCG/ACT AEPB Inhale 1-2 puffs into the lungs every 6 (six) hours as needed (for shortness of breath and/or wheezing.). Patient taking differently: Inhale  1-2 puffs into the lungs in the morning and at bedtime. 10/31/20   Rigoberto Noel, MD  ALPRAZolam Duanne Moron) 0.25 MG tablet Take 0.25 mg by mouth 3 (three) times daily as needed for anxiety.    [provider]  amoxicillin-clavulanate (AUGMENTIN) 875-125 MG tablet Take 1 tablet by mouth 2 (two) times daily. 12/18/21   [provider]  apixaban (ELIQUIS) 5 MG TABS tablet TAKE 1 TABLET(5 MG) BY MOUTH TWICE DAILY Patient taking differently: Take 5 mg by mouth 2 (two) times daily. 12/03/21   Jettie Booze, MD  Budeson-Glycopyrrol-Formoterol  (BREZTRI AEROSPHERE) 160-9-4.8 MCG/ACT AERO Inhale 2 puffs into the lungs in the morning and at bedtime. 09/18/20   Lauraine Rinne, NP  cycloSPORINE (RESTASIS) 0.05 % ophthalmic emulsion Place 1 drop into both eyes 2 (two) times daily.    [provider]  diltiazem (CARDIZEM CD) 120 MG 24 hr capsule Take 1 capsule (120 mg total) by mouth in the morning and at bedtime. 12/21/21   Sherren Mocha, MD  DULoxetine (CYMBALTA) 60 MG capsule Take 60 mg by mouth daily. 09/25/17   [provider]  furosemide (LASIX) 40 MG tablet Take 0.5 tablets (20 mg total) by mouth daily. Check BMP follow-up with PCP before resuming Patient taking differently: Take 20 mg by mouth daily. 08/27/21   Antonieta Pert, MD  isosorbide mononitrate (IMDUR) 60 MG 24 hr tablet TAKE 1 TABLET BY MOUTH  DAILY Patient taking differently: 60 mg daily. 09/27/21   Jettie Booze, MD  levothyroxine (SYNTHROID) 25 MCG tablet Take 25 mcg by mouth daily before breakfast.    [provider]  Nebulizers (COMPRESSOR NEBULIZER) MISC 1 Device by Does not apply route once. 04/04/16   Deneise Lever, MD  nitroGLYCERIN (NITROSTAT) 0.4 MG SL tablet Place 1 tablet (0.4 mg total) under the tongue every 5 (five) minutes as needed for chest pain. 09/07/21   Jettie Booze, MD  polyethylene glycol powder [facility]) 17 GM/SCOOP powder Take 17 g by mouth daily.    [provider]  potassium chloride SA (KLOR-CON) 20 MEQ tablet Take 20 mEq by mouth daily. 08/31/21   [provider]  psyllium (METAMUCIL) 58.6 % powder Take 1 packet by mouth daily as needed (cnstipation).    [provider]  Respiratory Therapy Supplies (FLUTTER) DEVI Use flutter device 3 times a day 07/22/18   Martyn Ehrich, NP  rosuvastatin (CRESTOR) 20 MG tablet TAKE 1 TABLET BY MOUTH  DAILY Patient taking differently: Take 20 mg by mouth daily. 11/23/20   Jettie Booze, MD   Allergies  Allergen Reactions   Aspirin  Anaphylaxis, Hives and Shortness Of Breath   Hydroxyquinolines Hives   Imipramine Hcl Other (See Comments)    Unknown reaction Other reaction(s): weakness/groggy   Pamelor [Nortriptyline Hcl] Other (See Comments)    Unknown reaction   Prednisone Shortness Of Breath, Itching and Other (See Comments)    anxiety   Pregabalin Other (See Comments)    Gives her the shakes   Procardia [Nifedipine] Other (See Comments)    Unknown reaction   Tramadol Swelling   Ace Inhibitors Other (See Comments)    cough   Hydroxyzine Hcl Itching   Influenza Vaccines Other (See Comments)    High fever, couldn't get out of bed   Morphine And Related Other (See Comments)    Felt 'out of sorts'   Oxycodone-Acetaminophen     Other reaction(s): Brain fog   Gabapentin Anxiety  Hydroxychloroquine Sulfate Rash    Other reaction(s): pruritis    FAMILY HISTORY:  Family History  Problem Relation Age of Onset   CAD Brother    Heart attack Brother 24   Bone cancer Sister    Throat cancer Daughter    SOCIAL HISTORY:  reports that she quit smoking about 59 years ago. Her smoking use included cigarettes. She started smoking about 72 years ago. She has a 12.00 pack-year smoking history. She has never used smokeless tobacco. She reports that she does not drink alcohol and does not use drugs.  REVIEW OF SYSTEMS:   Increase shortness of breath No cough or fever Slight pedal edema  Constitutional: Negative for fever, chills, weight loss, malaise/fatigue and diaphoresis.  HENT: Negative for hearing loss, ear pain, nosebleeds, congestion, sore throat, neck pain, tinnitus and ear discharge.   Eyes: Negative for blurred vision, double vision, photophobia, pain, discharge and redness.  Respiratory: Negative for cough, hemoptysis, sputum production and stridor.   Cardiovascular: Negative for chest pain, palpitations, orthopnea, claudication and PND.  Gastrointestinal: Negative for heartburn, nausea, vomiting,  abdominal pain, diarrhea, constipation, blood in stool and melena.  Genitourinary: Negative for dysuria, urgency, frequency, hematuria and flank pain.  Musculoskeletal: Negative for myalgias, back pain, joint pain and falls.  Skin: Negative for itching and rash.  Neurological: Negative for dizziness, tingling, tremors, sensory change, speech change, focal weakness, seizures, loss of consciousness, weakness and headaches.  Endo/Heme/Allergies: Negative for environmental allergies and polydipsia. Does not bruise/bleed easily.  SUBJECTIVE:   VITAL SIGNS: Temp:  [97.6 F (36.4 C)-98 F (36.7 C)] 97.6 F (36.4 C) (03/01 0805) Pulse Rate:  [68-157] 98 (03/01 1030) Resp:  [22-27] 25 (03/01 1030) BP: (126-205)/(79-176) 143/102 (03/01 1030) SpO2:  [4 %-99 %] 97 % (03/01 1030) Weight:  [99.3 kg] 99.3 kg (02/28 1121)  PHYSICAL EXAMINATION: Gen. elderly obese, in mild distress, on BiPAP fullface mask ENT - no lesions, no post nasal drip Neck: No JVD, no thyromegaly, no carotid bruits Lungs: no use of accessory muscles, no dullness to percussion, right basal dry crackles, no rhonchi Cardiovascular: Rhythm regular, heart sounds  normal, no murmurs, 1+ peripheral edema Abdomen: soft and non-tender, no hepatosplenomegaly, BS normal. Musculoskeletal: No deformities, no cyanosis or clubbing Neuro:  alert, non focal Skin:  Warm, no lesions/ rash   Recent Labs  Lab 12/21/21 2023 12/25/21 1208 01/08/2022 0702  NA 136 134* 133*  K 3.6 3.4* 3.2*  CL 98 96 96*  CO2 28 30 28   BUN 20 26* 21  CREATININE 1.21* 1.22* 1.23*  GLUCOSE 163* 134* 156*   Recent Labs  Lab 12/21/21 2023 12/25/21 1208 12/29/2021 0702  HGB 12.6 13.2 13.4  HCT 39.3 39.9 40.6  WBC 14.9* 15.6* 18.3*  PLT 179 247.0 251   DG Chest Port 1 View  Result Date: 01/02/2022 CLINICAL DATA:  Worsening shortness of breath EXAM: PORTABLE CHEST 1 VIEW COMPARISON:  12/25/2021 FINDINGS: Bilateral lower lobe airspace disease concerning for  pneumonia. No pleural effusion or pneumothorax. Stable cardiomediastinal silhouette. No aggressive osseous lesion. IMPRESSION: 1. Bilateral lower lobe airspace disease concerning for pneumonia. Electronically Signed   By: Kathreen Devoid M.D.   On: 12/30/2021 07:44    ASSESSMENT / PLAN:  Acute hypoxic respiratory failure, oxygen needs have increased from baseline 2 L, requiring BiPAP in the emergency room for work of breathing -Chest x-ray suggestive of bibasilar pneumonia, she was recently on Augmentin Leukocytosis could be related to steroids, will obtain procalcitonin to clarify -This  does not appear to be a COPD exacerbation   Plan -Agree with ceftriaxone/azithromycin -Obtain procalcitonin, urine strep antigen and Legionella for completion -IV Solu-Medrol has been started, would rapidly discontinue in 24 hours if no bronchospasm -Plan off BiPAP in 4 hours once work of breathing decreases  Atrial fibrillation/RVR Acute on chronic diastolic heart failure -Cardizem drip, being managed by cardiology  Goals of care -has been discussed in the office prior visit, daughter at the bedside again emphasizes that she would not want any forms of life support she has had decline over the past few months and has been debilitated by chronic pain DNR will be issued   Kara Mead MD. Shade Flood. [facility] Pager [phone] If no response call [phone]    12/27/2021, 10:47 AM

## 2021-12-26 NOTE — Assessment & Plan Note (Signed)
Repletion ordered ?

## 2021-12-26 NOTE — Assessment & Plan Note (Signed)
Likely exacerbated by bronchodilators.  IV Cardizem given in the ED.  We will place her on oral Cardizem.  Cardiology team consulted.  Resume home Eliquis, if unable to take this p.o. then we will place her on IV heparin drip or Lovenox ?

## 2021-12-26 NOTE — Assessment & Plan Note (Signed)
Resume home Cardizem.  IV hydralazine and Lopressor as needed. ?

## 2021-12-26 NOTE — Patient Outreach (Signed)
[facility]) Care Management ? ?01/23/2022 ? ?[name] ?18-Jul-1932 ?[id number] ? ? ?Research scientist (medical) Discipline closure. Patient has been admitted to hospital. Patient will be followed by Complex Care Coordinator once discharged. ? ?Plan: ?RN Health Coach Discipline Closure ?Discipline Closure letter sent to PCP ? ?Johny Shock BSN RN ?[place] [facility] ?[phone]  ?

## 2021-12-26 NOTE — Progress Notes (Signed)
RT NOTE: ? ?Pt requested to have a break from the BiPAP machine. Pt WOB much better than initially. RT removed mask and placed pt on 4L [place] and nebulizer treatment. Pt able to speak in complete sentence without signs of SOB at this time. RN aware. ?

## 2021-12-26 NOTE — Assessment & Plan Note (Signed)
Possible exacerbation.  Lasix 40 mg IV twice daily.  Echocardiogram 2022 showed EF 65 to 70% with moderate LVH.  Replete electrolytes as necessary.  Cardiology consulted by ED.  Monitor urine output, replete electrolytes as necessary. ?

## 2021-12-26 NOTE — Assessment & Plan Note (Signed)
Creatinine around baseline of 1.2.  Monitor with diuresis ?

## 2021-12-26 NOTE — Progress Notes (Signed)
RT NOTE: ? ?Pt placed on BiPAP due to increased WOB per EDP order. Pt states she feels okay on the machine at this time. Vitals are stable at this time, RN is aware of BiPAP placement. RT will continue to monitor.  ?

## 2021-12-26 NOTE — Assessment & Plan Note (Signed)
IV steroids, scheduled and as needed bronchodilators.  I-S/flutter valve.  Pulmonary team consulted by the ED provider.  On empiric antibiotic ?

## 2021-12-26 NOTE — ED Notes (Signed)
Lunch providded ?

## 2021-12-26 NOTE — ED Triage Notes (Signed)
Patient c/o shob, worse this AM.  Pulmonologist wanted patient admitted to [facility] but they had no beds, so she was brought here.  EMS gave 2 grams of magnesium, 125 mg of solumedrol and 3 duonebs.   ?

## 2021-12-26 NOTE — Telephone Encounter (Signed)
Patient seen in ED on 2/24 and currently is in ED ?

## 2021-12-26 NOTE — Assessment & Plan Note (Signed)
BMI 37  

## 2021-12-26 NOTE — ED Provider Notes (Signed)
[facility] DEPT Provider Note   CSN: [id number] Arrival date & time: 01/10/2022  0645     History  Chief Complaint  Patient presents with   Shortness of Breath    Sheryl Curtis is a 88 y.o. female.   Shortness of Breath  Patient is an 88 year old female with past medical history significant for A-fib on Eliquis, diltiazem, Lasix, also history of reflux, CKD 3, HTN, chronic respiratory failure and asthma on 2 to 3 L of oxygen nasal cannula chronically.  Pulmonary nodules.  Patient is presented to emergency room today with complaints of continued increased work of breathing seems that her symptoms have been ongoing but seemingly worsened this morning prompting ER visit.  Patient denies any fevers.  Does cough occasionally but no purulent or significant mucus production.  Denies any chest pain.    Home Medications Prior to Admission medications   Medication Sig Start Date End Date Taking? Authorizing Provider  albuterol (PROVENTIL) (2.5 MG/3ML) 0.083% nebulizer solution USE 1 VIAL VIA NEBULIZER EVERY 6 HOURS AS NEEDED Patient taking differently: Take 2.5 mg by nebulization in the morning and at bedtime. 02/20/21  Yes Rigoberto Noel, MD  Albuterol Sulfate (PROAIR RESPICLICK) 272 (90 Base) MCG/ACT AEPB Inhale 1-2 puffs into the lungs every 6 (six) hours as needed (for shortness of breath and/or wheezing.). Patient taking differently: Inhale 1-2 puffs into the lungs in the morning and at bedtime. 10/31/20  Yes Rigoberto Noel, MD  ALPRAZolam Duanne Moron) 0.25 MG tablet Take 0.25 mg by mouth 3 (three) times daily as needed for anxiety.   Yes [provider]  amoxicillin-clavulanate (AUGMENTIN) 875-125 MG tablet Take 1 tablet by mouth 2 (two) times daily. 12/18/21  Yes [provider]  apixaban (ELIQUIS) 5 MG TABS tablet TAKE 1 TABLET(5 MG) BY MOUTH TWICE DAILY Patient taking differently: Take 5 mg by mouth 2 (two) times daily. 12/03/21  Yes Jettie Booze, MD  Budeson-Glycopyrrol-Formoterol (BREZTRI AEROSPHERE) 160-9-4.8 MCG/ACT AERO Inhale 2 puffs into the lungs in the morning and at bedtime. 09/18/20  Yes Lauraine Rinne, NP  cycloSPORINE (RESTASIS) 0.05 % ophthalmic emulsion Place 1 drop into both eyes 2 (two) times daily.   Yes [provider]  diltiazem (CARDIZEM CD) 120 MG 24 hr capsule Take 1 capsule (120 mg total) by mouth in the morning and at bedtime. 12/21/21  Yes Sherren Mocha, MD  DULoxetine (CYMBALTA) 60 MG capsule Take 60 mg by mouth daily. 09/25/17  Yes [provider]  furosemide (LASIX) 40 MG tablet Take 0.5 tablets (20 mg total) by mouth daily. Check BMP follow-up with PCP before resuming Patient taking differently: Take 20 mg by mouth daily. 08/27/21  Yes Antonieta Pert, MD  isosorbide mononitrate (IMDUR) 60 MG 24 hr tablet TAKE 1 TABLET BY MOUTH  DAILY Patient taking differently: 60 mg daily. 09/27/21  Yes Jettie Booze, MD  levothyroxine (SYNTHROID) 25 MCG tablet Take 25 mcg by mouth daily before breakfast.   Yes [provider]  Nebulizers (COMPRESSOR NEBULIZER) MISC 1 Device by Does not apply route once. 04/04/16  Yes Young, Tarri Fuller D, MD  nitroGLYCERIN (NITROSTAT) 0.4 MG SL tablet Place 1 tablet (0.4 mg total) under the tongue every 5 (five) minutes as needed for chest pain. 09/07/21  Yes Jettie Booze, MD  polyethylene glycol powder (GLYCOLAX/MIRALAX) 17 GM/SCOOP powder Take 17 g by mouth daily.   Yes [provider]  potassium chloride SA (KLOR-CON) 20 MEQ tablet Take 20 mEq  by mouth daily. 08/31/21  Yes [provider]  psyllium (METAMUCIL) 58.6 % powder Take 1 packet by mouth daily as needed (constipation).   Yes [provider]  Respiratory Therapy Supplies (FLUTTER) DEVI Use flutter device 3 times a day 07/22/18  Yes Martyn Ehrich, NP  rosuvastatin (CRESTOR) 20 MG tablet TAKE 1 TABLET BY MOUTH  DAILY Patient taking differently: Take 20 mg by mouth  daily. 11/23/20  Yes Jettie Booze, MD      Allergies    Aspirin, Hydroxyquinolines, Imipramine hcl, Pamelor [nortriptyline hcl], Prednisone, Pregabalin, Procardia [nifedipine], Tramadol, Ace inhibitors, Hydroxyzine hcl, Influenza vaccines, Morphine and related, Oxycodone-acetaminophen, Gabapentin, and Hydroxychloroquine sulfate    Review of Systems   Review of Systems  Respiratory:  Positive for shortness of breath.    Physical Exam Updated Vital Signs BP (!) 175/80    Pulse 84    Temp 97.8 F (36.6 C) (Oral)    Resp (!) 28    SpO2 97%  Physical Exam Vitals and nursing note reviewed.  Constitutional:      General: She is in acute distress.     Appearance: She is ill-appearing.  HENT:     Head: Normocephalic and atraumatic.     Nose: Nose normal.     Mouth/Throat:     Mouth: Mucous membranes are moist.  Eyes:     General: No scleral icterus. Cardiovascular:     Rate and Rhythm: Normal rate and regular rhythm.     Pulses: Normal pulses.     Heart sounds: Normal heart sounds.  Pulmonary:     Effort: No respiratory distress.     Breath sounds: Rales present. No wheezing.  Abdominal:     Palpations: Abdomen is soft.     Tenderness: There is no abdominal tenderness. There is no guarding or rebound.     Comments: Morbidly obese  Musculoskeletal:     Cervical back: Normal range of motion.     Right lower leg: No edema.     Left lower leg: No edema.  Skin:    General: Skin is warm and dry.     Capillary Refill: Capillary refill takes less than 2 seconds.  Neurological:     Mental Status: She is alert. Mental status is at baseline.  Psychiatric:        Mood and Affect: Mood normal.        Behavior: Behavior normal.    ED Results / Procedures / Treatments   Labs (all labs ordered are listed, but only abnormal results are displayed) Labs Reviewed  CBC WITH DIFFERENTIAL/PLATELET - Abnormal; Notable for the following components:      Result Value   WBC 18.3 (*)     Neutro Abs 14.5 (*)    Monocytes Absolute 1.3 (*)    All other components within normal limits  COMPREHENSIVE METABOLIC PANEL - Abnormal; Notable for the following components:   Sodium 133 (*)    Potassium 3.2 (*)    Chloride 96 (*)    Glucose, Bld 156 (*)    Creatinine, Ser 1.23 (*)    GFR, Estimated 43 (*)    All other components within normal limits  BRAIN NATRIURETIC PEPTIDE - Abnormal; Notable for the following components:   B Natriuretic Peptide 237.3 (*)    All other components within normal limits  MAGNESIUM - Abnormal; Notable for the following components:   Magnesium 2.6 (*)    All other components within normal limits  LACTIC ACID, PLASMA -  Abnormal; Notable for the following components:   Lactic Acid, Venous 2.0 (*)    All other components within normal limits  TROPONIN I (HIGH SENSITIVITY) - Abnormal; Notable for the following components:   Troponin I (High Sensitivity) 44 (*)    All other components within normal limits  TROPONIN I (HIGH SENSITIVITY) - Abnormal; Notable for the following components:   Troponin I (High Sensitivity) 30 (*)    All other components within normal limits  RESP PANEL BY RT-PCR (FLU A&B, COVID) ARPGX2  MRSA NEXT GEN BY PCR, NASAL  CULTURE, BLOOD (ROUTINE X 2)  CULTURE, BLOOD (ROUTINE X 2)  LACTIC ACID, PLASMA  TSH  PROCALCITONIN    EKG None  Radiology DG Chest Port 1 View  Result Date: 01/22/2022 CLINICAL DATA:  Worsening shortness of breath EXAM: PORTABLE CHEST 1 VIEW COMPARISON:  12/25/2021 FINDINGS: Bilateral lower lobe airspace disease concerning for pneumonia. No pleural effusion or pneumothorax. Stable cardiomediastinal silhouette. No aggressive osseous lesion. IMPRESSION: 1. Bilateral lower lobe airspace disease concerning for pneumonia. Electronically Signed   By: Kathreen Devoid M.D.   On: 01/05/2022 07:44   ECHOCARDIOGRAM COMPLETE  Result Date: 01/06/2022    ECHOCARDIOGRAM REPORT   Patient Name:   Sheryl Curtis Date of Exam:  12/31/2021 Medical Rec #:  [id number]     Height:       64.0 in Accession #:    [id number]    Weight:       219.0 lb Date of Birth:  September 01, 1932     BSA:          2.033 m Patient Age:    21 years      BP:           143/102 mmHg Patient Gender: F             HR:           95 bpm. Exam Location:  Inpatient Procedure: 2D Echo, Cardiac Doppler, Color Doppler and Intracardiac            Opacification Agent Indications:    I48.91 AFIB  History:        Patient has prior history of Echocardiogram examinations, most                 recent 08/19/2021. CAD, Arrythmias:Atrial Fibrillation; Risk                 Factors:Hypertension. RESP. FAILURE / MORBID OBESITY.  Sonographer:    Beryle Beams Referring Phys: [address] [place]  Sonographer Comments: Suboptimal parasternal window, suboptimal apical window, suboptimal subcostal window and patient is morbidly obese. Image acquisition challenging due to patient body habitus, Image acquisition challenging due to respiratory motion and Image acquisition challenging due to COPD. IMPRESSIONS  1. Left ventricular ejection fraction, by estimation, is 60 to 65%. The left ventricle has normal function. The left ventricle has no regional wall motion abnormalities. Left ventricular diastolic function could not be evaluated.  2. Right ventricular systolic function was not well visualized. The right ventricular size is not well visualized.  3. The mitral valve is grossly normal. Mild mitral valve regurgitation.  4. The aortic valve is grossly normal. Aortic valve regurgitation is not visualized. FINDINGS  Left Ventricle: Left ventricular ejection fraction, by estimation, is 60 to 65%. The left ventricle has normal function. The left ventricle has no regional wall motion abnormalities. The left ventricular internal cavity size was normal in size. There is  no left ventricular hypertrophy.  Left ventricular diastolic function could not be evaluated due to atrial fibrillation. Left ventricular  diastolic function could not be evaluated. Right Ventricle: The right ventricular size is not well visualized. Right vetricular wall thickness was not well visualized. Right ventricular systolic function was not well visualized. Left Atrium: Left atrial size was normal in size. Right Atrium: Right atrial size was normal in size. Pericardium: There is no evidence of pericardial effusion. Mitral Valve: The mitral valve is grossly normal. Mild mitral valve regurgitation. Tricuspid Valve: The tricuspid valve is not well visualized. Tricuspid valve regurgitation is not demonstrated. Aortic Valve: The aortic valve is grossly normal. Aortic valve regurgitation is not visualized. Aortic valve mean gradient measures 3.0 mmHg. Aortic valve peak gradient measures 6.1 mmHg. Aortic valve area, by VTI measures 1.71 cm. Pulmonic Valve: The pulmonic valve was normal in structure. Pulmonic valve regurgitation is not visualized. Aorta: The aortic root and ascending aorta are structurally normal, with no evidence of dilitation. IAS/Shunts: The atrial septum is grossly normal.  LEFT VENTRICLE PLAX 2D LVIDd:         4.80 cm LVIDs:         2.50 cm LV PW:         1.30 cm LV IVS:        1.20 cm LVOT diam:     1.70 cm LV SV:         48 LV SV Index:   24 LVOT Area:     2.27 cm  LV Volumes (MOD) LV vol d, MOD A2C: 77.4 ml LV vol d, MOD A4C: 90.6 ml LV vol s, MOD A2C: 40.3 ml LV vol s, MOD A4C: 51.2 ml LV SV MOD A2C:     37.1 ml LV SV MOD A4C:     90.6 ml LV SV MOD BP:      39.3 ml RIGHT VENTRICLE            IVC RV S prime:     8.16 cm/s  IVC diam: 2.20 cm TAPSE (M-mode): 1.2 cm LEFT ATRIUM             Index        RIGHT ATRIUM           Index LA diam:        4.40 cm 2.16 cm/m   RA Area:     14.70 cm LA Vol (A2C):   53.6 ml 26.36 ml/m  RA Volume:   32.20 ml  15.84 ml/m LA Vol (A4C):   51.2 ml 25.18 ml/m LA Biplane Vol: 57.5 ml 28.28 ml/m  AORTIC VALVE                    PULMONIC VALVE AV Area (Vmax):    1.65 cm     PV Vmax:       0.70  m/s AV Area (Vmean):   1.76 cm     PV Vmean:      45.200 cm/s AV Area (VTI):     1.71 cm     PV VTI:        0.122 m AV Vmax:           123.00 cm/s  PV Peak grad:  2.0 mmHg AV Vmean:          80.800 cm/s  PV Mean grad:  1.0 mmHg AV VTI:            0.280 m AV Peak Grad:      6.1  mmHg AV Mean Grad:      3.0 mmHg LVOT Vmax:         89.50 cm/s LVOT Vmean:        62.700 cm/s LVOT VTI:          0.211 m LVOT/AV VTI ratio: 0.75  AORTA Ao Root diam: 2.80 cm Ao Asc diam:  2.60 cm  SHUNTS Systemic VTI:  0.21 m Systemic Diam: 1.70 cm Mertie Moores MD Electronically signed by Mertie Moores MD Signature Date/Time: 01/21/2022/3:20:37 PM    Final     Procedures .Critical Care Performed by: Tedd Sias, PA Authorized by: Tedd Sias, PA   Critical care provider statement:    Critical care time (minutes):  35   Critical care time was exclusive of:  Separately billable procedures and treating other patients and teaching time   Critical care was necessary to treat or prevent imminent or life-threatening deterioration of the following conditions: Respiratory failure, A-fib RVR, unstable tachycardia.   Critical care was time spent personally by me on the following activities:  Development of treatment plan with patient or surrogate, review of old charts, re-evaluation of patient's condition, pulse oximetry, ordering and review of radiographic studies, ordering and review of laboratory studies, ordering and performing treatments and interventions, obtaining history from patient or surrogate, examination of patient and evaluation of patient's response to treatment   Care discussed with: admitting provider      Medications Ordered in ED Medications  diltiazem (CARDIZEM) 1 mg/mL load via infusion 10 mg (10 mg Intravenous Bolus from Bag 01/11/2022 0744)    And  diltiazem (CARDIZEM) 125 mg in dextrose 5% 125 mL (1 mg/mL) infusion (15 mg/hr Intravenous New Bag/Given 01/09/2022 1604)  azithromycin (ZITHROMAX) 500 mg in  sodium chloride 0.9 % 250 mL IVPB (0 mg Intravenous Stopped 01/18/2022 1021)  ipratropium-albuterol (DUONEB) 0.5-2.5 (3) MG/3ML nebulizer solution 3 mL (3 mLs Nebulization Given by Other 12/26/2021 1411)  ipratropium-albuterol (DUONEB) 0.5-2.5 (3) MG/3ML nebulizer solution 3 mL (has no administration in time range)  metoprolol tartrate (LOPRESSOR) injection 5 mg (has no administration in time range)  hydrALAZINE (APRESOLINE) injection 10 mg (has no administration in time range)  methylPREDNISolone sodium succinate (SOLU-MEDROL) 40 mg/mL injection 40 mg (40 mg Intravenous Given 01/16/2022 1315)  senna-docusate (Senokot-S) tablet 1 tablet (has no administration in time range)  guaiFENesin (ROBITUSSIN) 100 MG/5ML liquid 5 mL (has no administration in time range)  budesonide (PULMICORT) nebulizer solution 0.5 mg (0.5 mg Nebulization Given 01/08/2022 1129)  furosemide (LASIX) injection 40 mg (has no administration in time range)  diltiazem (CARDIZEM) tablet 60 mg (60 mg Oral Given 01/09/2022 1307)  isosorbide mononitrate (IMDUR) 24 hr tablet 60 mg (60 mg Oral Given 01/12/2022 1307)  rosuvastatin (CRESTOR) tablet 20 mg (0 mg Oral Hold 12/29/2021 1520)  ALPRAZolam (XANAX) tablet 0.25 mg (has no administration in time range)  DULoxetine (CYMBALTA) DR capsule 60 mg (0 mg Oral Hold 01/21/2022 1519)  levothyroxine (SYNTHROID) tablet 25 mcg (has no administration in time range)  apixaban (ELIQUIS) tablet 5 mg (5 mg Oral Given 12/30/2021 1307)  ondansetron (ZOFRAN) tablet 4 mg (has no administration in time range)    Or  ondansetron (ZOFRAN) injection 4 mg (has no administration in time range)  cefTRIAXone (ROCEPHIN) 1 g in sodium chloride 0.9 % 100 mL IVPB (has no administration in time range)  amiodarone (NEXTERONE) 1.8 mg/mL load via infusion 150 mg (150 mg Intravenous Bolus from Bag 01/13/2022 1250)    Followed by  amiodarone (NEXTERONE PREMIX) 360-4.14 MG/200ML-% (1.8 mg/mL) IV infusion (60 mg/hr Intravenous New Bag/Given 01/25/2022 1538)     Followed by  amiodarone (NEXTERONE PREMIX) 360-4.14 MG/200ML-% (1.8 mg/mL) IV infusion (has no administration in time range)  perflutren lipid microspheres (DEFINITY) IV suspension (2 mLs Intravenous Given 12/29/2021 1217)  Chlorhexidine Gluconate Cloth 2 % PADS 6 each (6 each Topical Given 01/14/2022 1249)  MEDLINE mouth rinse (15 mLs Mouth Rinse Given 01/14/2022 1258)  potassium chloride SA (KLOR-CON M) CR tablet 40 mEq (40 mEq Oral Given 01/20/2022 0807)  acetaminophen (TYLENOL) tablet 650 mg (650 mg Oral Given 01/01/2022 0807)  furosemide (LASIX) injection 40 mg (40 mg Intravenous Given 01/25/2022 0834)  cefTRIAXone (ROCEPHIN) 1 g in sodium chloride 0.9 % 100 mL IVPB (0 g Intravenous Stopped 12/31/2021 0938)  diltiazem (CARDIZEM) injection 10 mg (10 mg Intravenous Given 01/16/2022 0818)  labetalol (NORMODYNE) injection 5 mg (5 mg Intravenous Given 01/21/2022 0912)  potassium chloride (KLOR-CON) packet 40 mEq (40 mEq Oral Given 01/15/2022 1306)    ED Course/ Medical Decision Making/ A&P Clinical Course as of 01/18/2022 1626  Wed Dec 26, 2021  1009 Seen by pulmonary critical care Dr. Elsworth Soho who recommended hospitalist admission.  Agrees with current treatment.  Recommends BiPAP continued now but trial off in 4 hours. [WF]  1010 Discussed with Dr. Claiborne Billings of cardiology who is in agreement with current treatment and recommended low-dose labetalol.  Will provide with 5 labetalol. [WF]    Clinical Course User Index [WF] Tedd Sias, PA                           Medical Decision Making Amount and/or Complexity of Data Reviewed Labs: ordered. Radiology: ordered.  Risk OTC drugs. Prescription drug management. Decision regarding hospitalization.   This patient presents to the ED for concern of dyspnea, this involves a number of treatment options, and is a complaint that carries with it a high risk of complications and morbidity.  The differential diagnosis includes The causes for shortness of breath include but are not limited  to Cardiac (AHF, pericardial effusion and tamponade, arrhythmias, ischemia, etc) Respiratory (COPD, asthma, pneumonia, pneumothorax, primary pulmonary hypertension, PE/VQ mismatch) Hematological (anemia) Neuromuscular (ALS, Guillain-Barr, etc)      Co morbidities: Discussed in HPI   Brief History:  In this particular case I have a very high suspicion the patient's dyspnea is driven by her tachycardia and perhaps some pulmonary edema from CHF due to being in A-fib RVR.  Physical exam with scant bilateral lower extremity edema crackles in bilateral bases faint JVP she is afebrile however given RVR leukocytosis--which is likely secondary to Solu-Medrol dosing--we will go ahead and cover for CAP with Rocephin and azithromycin.  Evaluated by critical care and deemed reasonable for floor/stepdown which I think is reasonable.  She seems to have had significant improvement with BiPAP.  EMR reviewed including pt PMHx, past surgical history and past visits to ER.   See HPI for more details   Lab Tests:  I ordered and independently interpreted labs.  The pertinent results include:    Labs notable for BNP marginally elevated magnesium somewhat elevated likely secondary to being bolused with magnesium on the way here.  She received p.o. potassium for hypokalemia 3.2 kidney function at baseline.  Leukocytosis discussed in brief history.  Suspect secondary to Solu-Medrol administration.  Imaging Studies:  Abnormal findings. I personally reviewed all imaging studies. Imaging notable for  Bilateral pulmonary edema/atelectasis  in bilateral bases.  No pleural effusion.  Suspect fluid overload.  Cardiac Monitoring:  The patient was maintained on a cardiac monitor.  I personally viewed and interpreted the cardiac monitored which showed an underlying rhythm of: A-fib RVR EKG non-ischemic A-fib RVR   Medicines ordered:  I ordered medication including diltiazem x2, infusion, 5 of labetalol,  Rocephin, azithromycin, p.o. potassium, Tylenol, Lasix 40  These medications were provided due to patient having multiple problems going on likely A-fib RVR precipitating CHF exacerbation.  Lasix for fluid overload and labetalol and diltiazem for rate control.  Significant improvement in her symptoms with BiPAP.  Reevaluation of the patient after these medicines showed that the patient improved I have reviewed the patients home medicines and have made adjustments as needed   Critical Interventions:  Stepdown admission   Consults:  I requested consultation with critical care,  and discussed lab and imaging findings as well as pertinent plan - they recommend: Admission to hospitalist.  Discussed with cardiology recommended low-dose labetalol admission.    Reevaluation:  After the interventions noted above I re-evaluated patient and found that they have :improved   Social Determinants of Health:  The patient's social determinants of health were a factor in the care of this patient    Problem List / ED Course:  A-fib RVR Pulmonary edema Dyspnea   Dispostion:  After consideration of the diagnostic results and the patients response to treatment, I feel that the patent would benefit from admission    Final Clinical Impression(s) / ED Diagnoses Final diagnoses:  Persistent atrial fibrillation [facility])    Rx / DC Orders ED Discharge Orders     None         Tedd Sias, [place] 01/19/2022 1626    Milton Ferguson, MD 12/27/21 [phone]

## 2021-12-26 NOTE — Assessment & Plan Note (Addendum)
Combination of asthma/COPD and CHF exacerbation.  Currently patient is on BiPAP.  Plan is to leave her on for at least next 3-4 hours thereafter slowly wean her off as appropriate. ?Leukocytosis secondary to recent steroid.  Empiric CAP coverage with Rocephin/azithromycin.  Check procalcitonin. ?

## 2021-12-26 NOTE — Progress Notes (Signed)
? ?  Echocardiogram ?2D Echocardiogram has been performed. ? ?Sheryl Curtis ?12/29/2021, 12:14 PM ?

## 2021-12-26 NOTE — H&P (Signed)
History and Physical    Patient: Sheryl Curtis LOV:[id number] DOB: [birth year] DOA: 01/16/2022 DOS: the patient was seen and examined on 01/10/2022 PCP: Collene Leyden, MD  Patient coming from: Home  Chief Complaint:  Chief Complaint  Patient presents with   Shortness of Breath    HPI: Sheryl Curtis is a 88 y.o. female with medical history significant of asthma/chronic hypoxia on 3 L nasal cannula, CKD stage IIIb, pulmonary nodule, severe rheumatoid arthritis, CAD status post PCI, paroxysmal A-fib on Eliquis, OSA, sleep apnea, HTN, colon cancer brought to the hospital for evaluation of shortness of breath.  Most of the history is provided by daughter at bedside as patient is on BiPAP.  Per daughter patient has been feeling progressive shortness of breath and chest tightness over the course of past 2 weeks.  She had visited ER couple of times but eventually discharged.  She followed up with her pulmonologist yesterday and direct admission was requested.  While awaiting direct admission from home, patient acutely became short of breath overnight therefore brought to the hospital.  In the ED he was noted to be in acute asthma exacerbation with component of CHF.  He had to be placed on BiPAP due to severe dyspnea.  He was given bronchodilators, IV Lasix and antibiotics.  She also went in atrial fibrillation with RVR.  Pulmonary and cardiology team was consulted.  Medical team was requested to admit the patient.  When I saw the patient she told me that she was feeling much better while on BiPAP, daughter was present at bedside.  Confirmed CODE STATUS-DNR  Review of Systems: As mentioned in the history of present illness. All other systems reviewed and are negative. Past Medical History:  Diagnosis Date   ALLERGIC RHINITIS    Asthma    CAD (coronary artery disease)    CANCER, COLORECTAL    Chronic kidney disease, stage 3b (HCC)    Chronic respiratory failure (HCC)    CKD (chronic kidney disease)     Essential hypertension, benign    FIBROMYALGIA    G E R D    INSOMNIA    Morbid obesity (HCC)    PAF (paroxysmal atrial fibrillation) (HCC)    Pulmonary nodules    Rheumatoid arthritis ([place])    Sleep apnea    Past Surgical History:  Procedure Laterality Date   ABDOMINAL HYSTERECTOMY     ANKLE FRACTURE SURGERY     BACK SURGERY     BREAST SURGERY     COLON SURGERY     CORONARY STENT PLACEMENT     PERCUTANEOUS CORONARY STENT INTERVENTION (PCI-S) N/A 05/19/2013   Procedure: PERCUTANEOUS CORONARY STENT INTERVENTION (PCI-S);  Surgeon: Jettie Booze, MD;  Location: [facility] CATH LAB;  Service: Cardiovascular;  Laterality: N/A;   Social History:  reports that she quit smoking about 59 years ago. Her smoking use included cigarettes. She started smoking about 72 years ago. She has a 12.00 pack-year smoking history. She has never used smokeless tobacco. She reports that she does not drink alcohol and does not use drugs.  Allergies  Allergen Reactions   Aspirin Anaphylaxis, Hives and Shortness Of Breath   Hydroxyquinolines Hives   Imipramine Hcl Other (See Comments)    Unknown reaction Other reaction(s): weakness/groggy   Pamelor [Nortriptyline Hcl] Other (See Comments)    Unknown reaction   Prednisone Shortness Of Breath, Itching and Other (See Comments)    anxiety   Pregabalin Other (See Comments)    Gives her  the shakes   Procardia [Nifedipine] Other (See Comments)    Unknown reaction   Tramadol Swelling   Ace Inhibitors Other (See Comments)    cough   Hydroxyzine Hcl Itching   Influenza Vaccines Other (See Comments)    High fever, couldn't get out of bed   Morphine And Related Other (See Comments)    Felt 'out of sorts'   Oxycodone-Acetaminophen     Other reaction(s): Brain fog   Gabapentin Anxiety   Hydroxychloroquine Sulfate Rash    Other reaction(s): pruritis    Family History  Problem Relation Age of Onset   CAD Brother    Heart attack Brother 59   Bone cancer  Sister    Throat cancer Daughter     Prior to Admission medications   Medication Sig Start Date End Date Taking? Authorizing Provider  albuterol (PROVENTIL) (2.5 MG/3ML) 0.083% nebulizer solution USE 1 VIAL VIA NEBULIZER EVERY 6 HOURS AS NEEDED Patient taking differently: Take 2.5 mg by nebulization in the morning and at bedtime. 02/20/21   Rigoberto Noel, MD  Albuterol Sulfate (PROAIR RESPICLICK) 938 (90 Base) MCG/ACT AEPB Inhale 1-2 puffs into the lungs every 6 (six) hours as needed (for shortness of breath and/or wheezing.). Patient taking differently: Inhale 1-2 puffs into the lungs in the morning and at bedtime. 10/31/20   Rigoberto Noel, MD  ALPRAZolam Duanne Moron) 0.25 MG tablet Take 0.25 mg by mouth 3 (three) times daily as needed for anxiety.    [provider]  amoxicillin-clavulanate (AUGMENTIN) 875-125 MG tablet Take 1 tablet by mouth 2 (two) times daily. 12/18/21   [provider]  apixaban (ELIQUIS) 5 MG TABS tablet TAKE 1 TABLET(5 MG) BY MOUTH TWICE DAILY Patient taking differently: Take 5 mg by mouth 2 (two) times daily. 12/03/21   Jettie Booze, MD  Budeson-Glycopyrrol-Formoterol (BREZTRI AEROSPHERE) 160-9-4.8 MCG/ACT AERO Inhale 2 puffs into the lungs in the morning and at bedtime. 09/18/20   Lauraine Rinne, NP  cycloSPORINE (RESTASIS) 0.05 % ophthalmic emulsion Place 1 drop into both eyes 2 (two) times daily.    [provider]  diltiazem (CARDIZEM CD) 120 MG 24 hr capsule Take 1 capsule (120 mg total) by mouth in the morning and at bedtime. 12/21/21   Sherren Mocha, MD  DULoxetine (CYMBALTA) 60 MG capsule Take 60 mg by mouth daily. 09/25/17   [provider]  furosemide (LASIX) 40 MG tablet Take 0.5 tablets (20 mg total) by mouth daily. Check BMP follow-up with PCP before resuming Patient taking differently: Take 20 mg by mouth daily. 08/27/21   Antonieta Pert, MD  isosorbide mononitrate (IMDUR) 60 MG 24 hr tablet TAKE 1 TABLET BY MOUTH   DAILY Patient taking differently: 60 mg daily. 09/27/21   Jettie Booze, MD  levothyroxine (SYNTHROID) 25 MCG tablet Take 25 mcg by mouth daily before breakfast.    [provider]  Nebulizers (COMPRESSOR NEBULIZER) MISC 1 Device by Does not apply route once. 04/04/16   Deneise Lever, MD  nitroGLYCERIN (NITROSTAT) 0.4 MG SL tablet Place 1 tablet (0.4 mg total) under the tongue every 5 (five) minutes as needed for chest pain. 09/07/21   Jettie Booze, MD  polyethylene glycol powder [facility]) 17 GM/SCOOP powder Take 17 g by mouth daily.    [provider]  potassium chloride SA (KLOR-CON) 20 MEQ tablet Take 20 mEq by mouth daily. 08/31/21   [provider]  psyllium (METAMUCIL) 58.6 % powder Take 1 packet by mouth  daily as needed (cnstipation).    [provider]  Respiratory Therapy Supplies (FLUTTER) DEVI Use flutter device 3 times a day 07/22/18   Martyn Ehrich, NP  rosuvastatin (CRESTOR) 20 MG tablet TAKE 1 TABLET BY MOUTH  DAILY Patient taking differently: Take 20 mg by mouth daily. 11/23/20   Jettie Booze, MD    Physical Exam: Vitals:   01/14/2022 0930 01/02/2022 0945 01/03/2022 1015 12/27/2021 1030  BP: (!) 143/80 (!) 136/100 140/79 (!) 143/102  Pulse: (!) 108 (!) 101 96 98  Resp: (!) 22 (!) 25 (!) 24 (!) 25  Temp:      TempSrc:      SpO2: 97% 98% 97% 97%   Constitutional: Mild distress as patient is on BiPAP Respiratory: Diffuse bilateral diminished breath sounds Cardiovascular: Irregularly irregular heart rate in 110s Abdomen: Nontender nondistended good bowel sounds Musculoskeletal: 1+ bilateral lower extremity pitting edema Skin: No rashes seen Neurologic: CN 2-12 grossly intact.  And nonfocal Psychiatric: Normal judgment and insight. Alert and oriented x 3. Normal mood.  Data Reviewed: nonischemic EKG Potassium 5.2 BNP 237   Assessment and Plan: * Acute respiratory distress- (present on  admission) Combination of asthma/COPD and CHF exacerbation.  Currently patient is on BiPAP.  Plan is to leave her on for at least next 3-4 hours thereafter slowly wean her off as appropriate. Leukocytosis secondary to recent steroid.  Empiric CAP coverage with Rocephin/azithromycin.  Check procalcitonin.  Paroxysmal atrial fibrillation with RVR (HCC) Likely exacerbated by bronchodilators.  IV Cardizem given in the ED.  We will place her on oral Cardizem.  Cardiology team consulted.  Resume home Eliquis, if unable to take this p.o. then we will place her on IV heparin drip or Lovenox  Acute on chronic diastolic CHF (congestive heart failure) (HCC) Possible exacerbation.  Lasix 40 mg IV twice daily.  Echocardiogram 2022 showed EF 65 to 70% with moderate LVH.  Replete electrolytes as necessary.  Cardiology consulted by ED.  Monitor urine output, replete electrolytes as necessary.  Mild persistent asthma with (acute) exacerbation- (present on admission) IV steroids, scheduled and as needed bronchodilators.  I-S/flutter valve.  Pulmonary team consulted by the ED provider.  On empiric antibiotic  Morbid obesity ([place])- (present on admission) BMI 37  Chronic kidney disease (CKD), stage III (moderate) ([place])- (present on admission) Creatinine around baseline of 1.2.  Monitor with diuresis  Hypokalemia Repletion ordered  Coronary atherosclerosis of native coronary artery- (present on admission) Currently chest pain-free.  Troponins are relatively flat.  Cardiology team has been consulted.  Essential hypertension, benign- (present on admission) Resume home Cardizem.  IV hydralazine and Lopressor as needed.       Advance Care Planning:   Code Status: DNR confirmed by patient and daughter  Consults: Cardiology, pulmonary (Dr Elsworth Soho)- consulted by ED  Family Communication: Daughter at bedside  Severity of Illness: The appropriate patient status for this patient is INPATIENT. Inpatient status  is judged to be reasonable and necessary in order to provide the required intensity of service to ensure the patient's safety. The patient's presenting symptoms, physical exam findings, and initial radiographic and laboratory data in the context of their chronic comorbidities is felt to place them at high risk for further clinical deterioration. Furthermore, it is not anticipated that the patient will be medically stable for discharge from the hospital within 2 midnights of admission.   * I certify that at the point of admission it is my clinical judgment that the patient will require  inpatient hospital care spanning beyond 2 midnights from the point of admission due to high intensity of service, high risk for further deterioration and high frequency of surveillance required.*  Author: Damita Lack, MD 01/13/2022 10:52 AM  For on call review [email or web address].

## 2021-12-26 NOTE — Assessment & Plan Note (Signed)
Currently chest pain-free.  Troponins are relatively flat.  Cardiology team has been consulted. ?

## 2021-12-26 NOTE — Consult Note (Addendum)
Cardiology Consultation:   Patient ID: Sheryl Curtis MRN: [id number]; DOB: 1932-08-23  Admit date: 01/25/2022 Date of Consult: 01/15/2022  PCP:  Collene Leyden, MD   [facility] Providers Cardiologist:  Larae Grooms, MD        Patient Profile:   Sheryl Curtis is a 88 y.o. female with a hx of CAD s/p stent to LAD 2014, paroxysmal atrial fibrillation (dx 07/2021), severe RA, asthma, pulmonary nodules, chronic respiratory failure, obesity, GERD, CKD stage 3b, colon CA, HTN, sleep apnea, fibromyalgia who is being seen 01/06/2022 for the evaluation of atrial fibrillation and hypertension at the request of Dr. Roderic Palau.  History of Present Illness:   Sheryl Curtis follows with Dr. Irish Lack & had remote cath 2014 with DES to the LAD, otherwise normal coronaries, EF normal. Last ischemic eval was with normal nuc in 12/2020. She was diagnosed with atrial fib in 07/2021, managed with rate control + anticoag. 2D echo 07/2021 EF 65-70%, moderate LVH, otherwise valves not well visualized due to poor echo windows. At f/u OV 08/2021, HR was controlled and she was continued on diltiazem. EKG called afib but appears possibly sinus at that time. F/u EKGs later in 08/2021 were definitely NSR.   She has had progressive failure to thrive over the past few weeks to months. She has become increasingly less mobile due to chronic back pain, and has had multiple ER visits over the last. She was seen in the ED 2/23 for evaluation of chest pain/SOB and was found to be in rapid atrial fib. She was reported to convert to NSR with IV diltiazem and was discharged home. She went back to the ER that day for elevated BP readings at home. BP in the ED was 157 and diltiazem was increased to 120mg  BID in accordance with our office phone note recommendation earlier that day. She was also recently treated with a course of Augmentin for possible sinus infection. Of note, recent high res CT 12/20/21 showed moderate air trapping indicative of  small airways disease, mild tracheobronchomalacia, multiple small pulmonary nodules considered benign, aortic/coronary atherosclerosis and calcification of mitral annulus. In talking with Dr. Elsworth Soho today who has known her as an outpatient, there has not been an obvious cause for her hypoxic respiratory failure aside from the possible dx of asthma; she has not had ILD, and has required intermittent diuretic dosing raising question of underlying HF as well.   She was seen by pulmonology office yesterday and reported persistent SOB and difficulty sleeping due to dyspnea, wheezing, and increased O2 requirement (4L compared to 2-3L). Admission was recommended but she didn't get a bed overnight so daughter called EMS this AM for worsening respiratory distress. She received 2g Mag, 125mg  solumedrol and 3 nebs. Here ED workup notable for Na 133, K 3.2, Cr 1.23 (c/w prior), BNP 237, leukocytosis of 18k with 1.5 neutrophils, Covid/flu neg, hsTroponin 44 (c/w recent values). Today's CXR shows bilateral lower lobe airspace disease concerning for PNA; no effusion or ptx. She has received abx, 40mg  IV Lasix, 55meq KCl, 5mg  IV labetalol, 10mg  diltiazem bolus then started on drip. BP max 205/176 with last check 143/80. She remains on BiPAP at this time with increased WOB. HR now 90s-100s.  Recent home BP regimen includes diltiazem 120mg  BID, Lasix 20mg  daily, and Imdur 60mg  daily.  Past Medical History:  Diagnosis Date   ALLERGIC RHINITIS    Asthma    CAD (coronary artery disease)    CANCER, COLORECTAL    Chronic kidney  disease, stage 3b ([place])    Chronic respiratory failure (HCC)    CKD (chronic kidney disease)    Essential hypertension, benign    FIBROMYALGIA    G E R D    INSOMNIA    Morbid obesity (HCC)    PAF (paroxysmal atrial fibrillation) (HCC)    Pulmonary nodules    Rheumatoid arthritis ([place])    Sleep apnea     Past Surgical History:  Procedure Laterality Date   ABDOMINAL HYSTERECTOMY     ANKLE  FRACTURE SURGERY     BACK SURGERY     BREAST SURGERY     COLON SURGERY     CORONARY STENT PLACEMENT     PERCUTANEOUS CORONARY STENT INTERVENTION (PCI-S) N/A 05/19/2013   Procedure: PERCUTANEOUS CORONARY STENT INTERVENTION (PCI-S);  Surgeon: Jettie Booze, MD;  Location: [facility] CATH LAB;  Service: Cardiovascular;  Laterality: N/A;     Home Medications:  Prior to Admission medications   Medication Sig Start Date End Date Taking? Authorizing Provider  albuterol (PROVENTIL) (2.5 MG/3ML) 0.083% nebulizer solution USE 1 VIAL VIA NEBULIZER EVERY 6 HOURS AS NEEDED Patient taking differently: Take 2.5 mg by nebulization in the morning and at bedtime. 02/20/21   Rigoberto Noel, MD  Albuterol Sulfate (PROAIR RESPICLICK) 595 (90 Base) MCG/ACT AEPB Inhale 1-2 puffs into the lungs every 6 (six) hours as needed (for shortness of breath and/or wheezing.). Patient taking differently: Inhale 1-2 puffs into the lungs in the morning and at bedtime. 10/31/20   Rigoberto Noel, MD  ALPRAZolam Duanne Moron) 0.25 MG tablet Take 0.25 mg by mouth 3 (three) times daily as needed for anxiety.    [provider]  amoxicillin-clavulanate (AUGMENTIN) 875-125 MG tablet Take 1 tablet by mouth 2 (two) times daily. 12/18/21   [provider]  apixaban (ELIQUIS) 5 MG TABS tablet TAKE 1 TABLET(5 MG) BY MOUTH TWICE DAILY Patient taking differently: Take 5 mg by mouth 2 (two) times daily. 12/03/21   Jettie Booze, MD  Budeson-Glycopyrrol-Formoterol (BREZTRI AEROSPHERE) 160-9-4.8 MCG/ACT AERO Inhale 2 puffs into the lungs in the morning and at bedtime. 09/18/20   Lauraine Rinne, NP  cycloSPORINE (RESTASIS) 0.05 % ophthalmic emulsion Place 1 drop into both eyes 2 (two) times daily.    [provider]  diltiazem (CARDIZEM CD) 120 MG 24 hr capsule Take 1 capsule (120 mg total) by mouth in the morning and at bedtime. 12/21/21   Sherren Mocha, MD  DULoxetine (CYMBALTA) 60 MG capsule Take 60 mg by mouth daily.  09/25/17   [provider]  furosemide (LASIX) 40 MG tablet Take 0.5 tablets (20 mg total) by mouth daily. Check BMP follow-up with PCP before resuming Patient taking differently: Take 20 mg by mouth daily. 08/27/21   Antonieta Pert, MD  isosorbide mononitrate (IMDUR) 60 MG 24 hr tablet TAKE 1 TABLET BY MOUTH  DAILY Patient taking differently: 60 mg daily. 09/27/21   Jettie Booze, MD  levothyroxine (SYNTHROID) 25 MCG tablet Take 25 mcg by mouth daily before breakfast.    [provider]  Nebulizers (COMPRESSOR NEBULIZER) MISC 1 Device by Does not apply route once. 04/04/16   Deneise Lever, MD  nitroGLYCERIN (NITROSTAT) 0.4 MG SL tablet Place 1 tablet (0.4 mg total) under the tongue every 5 (five) minutes as needed for chest pain. 09/07/21   Jettie Booze, MD  polyethylene glycol powder [facility]) 17 GM/SCOOP powder Take 17 g by mouth daily.    [provider]  potassium chloride SA (KLOR-CON) 20 MEQ tablet Take 20 mEq by mouth daily. 08/31/21   [provider]  psyllium (METAMUCIL) 58.6 % powder Take 1 packet by mouth daily as needed (cnstipation).    [provider]  Respiratory Therapy Supplies (FLUTTER) DEVI Use flutter device 3 times a day 07/22/18   Martyn Ehrich, NP  rosuvastatin (CRESTOR) 20 MG tablet TAKE 1 TABLET BY MOUTH  DAILY Patient taking differently: Take 20 mg by mouth daily. 11/23/20   Jettie Booze, MD    Inpatient Medications: Scheduled Meds:  Continuous Infusions:  azithromycin 500 mg (01/16/2022 0921)   diltiazem (CARDIZEM) infusion 15 mg/hr (01/03/2022 0755)   PRN Meds:   Allergies:    Allergies  Allergen Reactions   Aspirin Anaphylaxis, Hives and Shortness Of Breath   Hydroxyquinolines Hives   Imipramine Hcl Other (See Comments)    Unknown reaction Other reaction(s): weakness/groggy   Pamelor [Nortriptyline Hcl] Other (See Comments)    Unknown reaction   Prednisone Shortness Of Breath,  Itching and Other (See Comments)    anxiety   Pregabalin Other (See Comments)    Gives her the shakes   Procardia [Nifedipine] Other (See Comments)    Unknown reaction   Tramadol Swelling   Ace Inhibitors Other (See Comments)    cough   Hydroxyzine Hcl Itching   Influenza Vaccines Other (See Comments)    High fever, couldn't get out of bed   Morphine And Related Other (See Comments)    Felt 'out of sorts'   Oxycodone-Acetaminophen     Other reaction(s): Brain fog   Gabapentin Anxiety   Hydroxychloroquine Sulfate Rash    Other reaction(s): pruritis    Social History:   Social History   Socioeconomic History   Marital status: Widowed    Spouse name: Not on file   Number of children: 4   Years of education: Not on file   Highest education level: Not on file  Occupational History   Not on file  Tobacco Use   Smoking status: Former    Packs/day: 1.00    Years: 12.00    Pack years: 12.00    Types: Cigarettes    Start date: 10/28/1949    Quit date: 10/28/1962    Years since quitting: 59.2   Smokeless tobacco: Never  Vaping Use   Vaping Use: Never used  Substance and Sexual Activity   Alcohol use: No   Drug use: No   Sexual activity: Not on file  Other Topics Concern   Not on file  Social History Narrative   Not on file   Social Determinants of Health   Financial Resource Strain: Not on file  Food Insecurity: No Food Insecurity   Worried About Running Out of Food in the Last Year: Never true   [place] in the Last Year: Never true  Transportation Needs: No Transportation Needs   Lack of Transportation (Medical): No   Lack of Transportation (Non-Medical): No  Physical Activity: Not on file  Stress: Not on file  Social Connections: Not on file  Intimate Partner Violence: Not At Risk   Fear of Current or Ex-Partner: No   Emotionally Abused: No   Physically Abused: No   Sexually Abused: No    Family History:    Family History  Problem Relation Age of  Onset   CAD Brother    Heart attack Brother 19   Bone cancer Sister    Throat cancer Daughter  ROS:  Please see the history of present illness.   All other ROS reviewed and negative.     Physical Exam/Data:   Vitals:   01/09/2022 0915 01/02/2022 0930 01/07/2022 0945 12/31/2021 1015  BP: (!) 152/95 (!) 143/80 (!) 136/100 140/79  Pulse: (!) 132 (!) 108 (!) 101 96  Resp: (!) 24 (!) 22 (!) 25 (!) 24  Temp:      TempSrc:      SpO2: 98% 97% 98% 97%    Intake/Output Summary (Last 24 hours) at 01/02/2022 1021 Last data filed at 12/30/2021 5329 Gross per 24 hour  Intake 100 ml  Output --  Net 100 ml   Last 3 Weights 12/25/2021 12/21/2021 12/20/2021  Weight (lbs) 219 lb 225 lb 219 lb  Weight (kg) 99.338 kg 102.059 kg 99.338 kg     There is no height or weight on file to calculate BMI.  General: Morbidly obese WF with increased WOB on BiPAP. Head: Normocephalic, atraumatic, sclera non-icteric, no xanthomas, nares are without discharge. Neck: Negative for carotid bruits. JVP not elevated. Lungs: Bibasilar crackles otherwise diffusely diminished. No overt wheezing Heart: Irregularly irregular, distant heart sounds, no m/r/g Abdomen: Soft, non-tender, non-distended with normoactive bowel sounds. No rebound/guarding. Extremities: No clubbing or cyanosis. Trace ankle edema. Distal pedal pulses are 2+ and equal bilaterally. Neuro: Alert and oriented X 3. Moves all extremities spontaneously. Psych:  Responds to questions appropriately with a calm affect.   EKG:  The EKG was personally reviewed and demonstrates:  atrial fibrillation 139bpm borderline RAD nonspecific STTW changes  Telemetry:  Telemetry was personally reviewed and demonstrates:  atrial fib  Relevant CV Studies:  Nuc 12/2020 Nuclear stress EF: 73%. The left ventricular ejection fraction is hyperdynamic (>65%). There was no ST segment deviation noted during stress. No T wave inversion was noted during stress. The study is  normal. This is a low risk study.   1.  Normal study without evidence of ischemia or infarction. 2.  Normal LV function, EF > 65%. 3.  This is a low risk study.  Echo 07/2021  1. Left ventricular ejection fraction, by estimation, is 65 to 70%. The  left ventricle has normal function. The left ventricle has no regional  wall motion abnormalities. There is moderate concentric left ventricular  hypertrophy. Left ventricular  diastolic parameters are indeterminate.   2. Right ventricular systolic function is normal. The right ventricular  size is not well visualized.   3. The mitral valve was not well visualized. Trivial mitral valve  regurgitation. No evidence of mitral stenosis.   4. The aortic valve was not well visualized. Aortic valve regurgitation  is not visualized. No aortic stenosis is present.   Comparison(s): No significant change from prior study.   Conclusion(s)/Recommendation(s): Technically challenging images; valves  not well seen but no significant valve disease by Doppler. Echo contrast  shows normal to hyperdynamic LV function without focal wall motion  abnormalities.   Laboratory Data:  High Sensitivity Troponin:   Recent Labs  Lab 12/20/21 1318 12/20/21 1547 01/12/2022 0702  TROPONINIHS 31* 38* 44*     Chemistry Recent Labs  Lab 12/20/21 1318 12/21/21 2023 12/25/21 1208 01/16/2022 0702  NA 135 136 134* 133*  K 3.6 3.6 3.4* 3.2*  CL 96* 98 96 96*  CO2 31 28 30 28   GLUCOSE 134* 163* 134* 156*  BUN 20 20 26* 21  CREATININE 1.11* 1.21* 1.22* 1.23*  CALCIUM 9.1 9.0 9.5 8.9  MG  --   --   --  2.6*  GFRNONAA 48* 43*  --  43*  ANIONGAP 8 10  --  9    Recent Labs  Lab 12/25/21 1208 01/13/2022 0702  PROT 7.1 7.2  ALBUMIN 4.0 3.9  AST 20 25  ALT 16 22  ALKPHOS 55 55  BILITOT 0.5 0.5   Lipids No results for input(s): CHOL, TRIG, HDL, LABVLDL, LDLCALC, CHOLHDL in the last 168 hours.  Hematology Recent Labs  Lab 12/20/21 1318 12/21/21 2023  12/25/21 1208 01/20/2022 0702  WBC 14.7* 14.9* 15.6* 18.3*  RBC 4.41 4.15 4.46 4.43  HGB 13.2 12.6 13.2 13.4  HCT 41.0 39.3 39.9 40.6  MCV 93.0 94.7 89.4 91.6  MCH 29.9 30.4  --  30.2  MCHC 32.2 32.1 33.1 33.0  RDW 14.3 14.2 14.7 14.7  PLT 185 179 247.0 251   Thyroid No results for input(s): TSH, FREET4 in the last 168 hours.  BNP Recent Labs  Lab 12/20/21 2123 12/25/21 1208 01/15/2022 0703  BNP 112.9*  --  237.3*  PROBNP  --  237.0*  --     DDimer No results for input(s): DDIMER in the last 168 hours.   Radiology/Studies:  DG Chest Port 1 View  Result Date: 01/09/2022 CLINICAL DATA:  Worsening shortness of breath EXAM: PORTABLE CHEST 1 VIEW COMPARISON:  12/25/2021 FINDINGS: Bilateral lower lobe airspace disease concerning for pneumonia. No pleural effusion or pneumothorax. Stable cardiomediastinal silhouette. No aggressive osseous lesion. IMPRESSION: 1. Bilateral lower lobe airspace disease concerning for pneumonia. Electronically Signed   By: Kathreen Devoid M.D.   On: 01/13/2022 07:44     Assessment and Plan:   1. Acute on chronic hypoxic respiratory failure, suspect multifactorial - here with PNA, but also with sequalae of rapid atrial fib -> question whether there is a component of acute on chronic diastolic CHF as well driven by the rapid atrial fibrillation - last echo 07/2021 EF normal, poor acoustic windows -> given recent progressive decline, will repeat this admission - pulmonary team is covering respiratory disease and PNA coverage - remains on BiPAP at this time - Agree with IV Lasix  2. Paroxysmal atrial fibrillation with recent episodes of RVR - rate now improved on IV diltiazem and support of oxygenation - will discuss rate vs rhythm strategy with MD - per d/w Dr. Elsworth Soho, given no ILD, would be OK to use amiodarone.  Will start amio gtt - recommend to continue anticoagulation - if there will be a prolonged period of NPO, consider IV heparin per pharmacy - check  baseline TSH  3. CAD s/p remote PCI - recent CP, low level troponins -> suspect demand process in context of AF RVR - given frailty, hold off on ischemic eval at this time, await echo  4. CKD stage 3b - Cr appears at baseline  5. Essential HTN - will review regimen with MD - some of her BP lability may be driven by acute respiratory distress  6. Hypokalemia - will defer lyte management to admitting team  Per d/w EDPA and Dr. Elsworth Soho, [place] conversations are being initiated and thus far patient has opted to be DNR.   Risk Assessment/Risk Scores:      New York Heart Association (NYHA) Functional Class NYHA Class IV  CHA2DS2-VASc Score = 8   This indicates a 10.8% annual risk of stroke. The patient's score is based upon: CHF History: 1 HTN History: 1 Diabetes History: 0 Stroke History: 2 Vascular Disease History: 1 Age Score: 2 Gender Score: 1  For questions or updates, please contact [place] Please consult [email or web address] for contact info under    Signed, Charlie Pitter, PA-C  01/21/2022 10:21 AM  Patient seen and examined.  Agree with above documentation.  Sheryl Curtis is an 88 year old female with history of paroxysmal atrial fibrillation, severe rheumatoid arthritis, CAD status post LAD stent 2014, chronic respiratory failure, CKD stage IIIb, colon cancer, OSA, hypertension who consulted for evaluation of atrial fibrillation and hypertension at the request of Dr. Roderic Palau.  She follows with Dr. Irish Lack in cardiology.  Diagnosed with paroxysmal atrial fibrillation in October 2022, started on Eliquis for anticoagulation.  Echocardiogram 07/2021 showed normal LV systolic function.  She presents with gradual decline over the last few months.  She presented to the ED 2/23 with chest pain and found to be in A-fib with RVR.  She was started on IV of diltiazem and converted to normal sinus rhythm and was discharged.  Subsequently went back to the ER later that day for  hypertension.  Her diltiazem was increased to 120 mg twice daily and she was discharged.  She was seen by pulmonology yesterday and noted to have worsening shortness of breath and O2 requirement up to 4 L.  It was recommended she be admitted for further work-up.  Awaiting for bed she developed worsening respiratory distress and EMS was called this morning.  Initial vital signs notable for BP 167/108, SPO2 88% on 4 L, pulse 155.  EKG showed atrial fibrillation, rate 139.  Labs notable for creatinine 1.23, sodium 133, potassium 3.2, magnesium 2.6, BNP 237, troponin 44, lactate 2.0, WBC 18.  Chest x-ray with bilateral lower lobe airspace disease concerning for pneumonia.  She required starting BiPAP in the ED. She was started on Cardizem drip for A-fib with RVR and given IV Lasix 40 mg.  On exam, patient is on Bipap but alert, tachycardic, irregular, no murmurs, bibasilar crackles, no LE edema.  Telemetry shows A-fib with rates initially 140s, has improved, currently 90s to 110s.  For her A-fib with RVR, currently rate controlled on diltiazem gtt.  Check echocardiogram.  Suspect some degree of diastolic heart failure contributing to her presentation, would recommend trying to maintain sinus rhythm.  She has been on Eliquis, reports no missed doses.  Started amiodarone drip.    Donato Heinz, MD

## 2021-12-27 DIAGNOSIS — Z515 Encounter for palliative care: Secondary | ICD-10-CM

## 2021-12-27 DIAGNOSIS — J9601 Acute respiratory failure with hypoxia: Secondary | ICD-10-CM | POA: Diagnosis present

## 2021-12-27 DIAGNOSIS — R0603 Acute respiratory distress: Secondary | ICD-10-CM | POA: Diagnosis not present

## 2021-12-27 DIAGNOSIS — I4891 Unspecified atrial fibrillation: Secondary | ICD-10-CM

## 2021-12-27 DIAGNOSIS — I5033 Acute on chronic diastolic (congestive) heart failure: Secondary | ICD-10-CM | POA: Diagnosis not present

## 2021-12-27 DIAGNOSIS — T17908A Unspecified foreign body in respiratory tract, part unspecified causing other injury, initial encounter: Secondary | ICD-10-CM | POA: Diagnosis not present

## 2021-12-27 DIAGNOSIS — J9621 Acute and chronic respiratory failure with hypoxia: Secondary | ICD-10-CM | POA: Diagnosis not present

## 2021-12-27 LAB — GLUCOSE, CAPILLARY: Glucose-Capillary: 225 mg/dL — ABNORMAL HIGH (ref 70–99)

## 2021-12-27 LAB — COMPREHENSIVE METABOLIC PANEL
ALT: 34 U/L (ref 0–44)
AST: 29 U/L (ref 15–41)
Albumin: 3.5 g/dL (ref 3.5–5.0)
Alkaline Phosphatase: 48 U/L (ref 38–126)
Anion gap: 12 (ref 5–15)
BUN: 33 mg/dL — ABNORMAL HIGH (ref 8–23)
CO2: 23 mmol/L (ref 22–32)
Calcium: 8.5 mg/dL — ABNORMAL LOW (ref 8.9–10.3)
Chloride: 95 mmol/L — ABNORMAL LOW (ref 98–111)
Creatinine, Ser: 1.69 mg/dL — ABNORMAL HIGH (ref 0.44–1.00)
GFR, Estimated: 29 mL/min — ABNORMAL LOW (ref >60)
Glucose, Bld: 252 mg/dL — ABNORMAL HIGH (ref 70–99)
Potassium: 4.1 mmol/L (ref 3.5–5.1)
Sodium: 130 mmol/L — ABNORMAL LOW (ref 135–145)
Total Bilirubin: 0.2 mg/dL — ABNORMAL LOW (ref 0.3–1.2)
Total Protein: 6.6 g/dL (ref 6.5–8.1)

## 2021-12-27 LAB — CBC
HCT: 38.3 % (ref 36.0–46.0)
Hemoglobin: 12.4 g/dL (ref 12.0–15.0)
MCH: 30.2 pg (ref 26.0–34.0)
MCHC: 32.4 g/dL (ref 30.0–36.0)
MCV: 93.4 fL (ref 80.0–100.0)
Platelets: 278 10*3/uL (ref 150–400)
RBC: 4.1 MIL/uL (ref 3.87–5.11)
RDW: 14.9 % (ref 11.5–15.5)
WBC: 21.5 10*3/uL — ABNORMAL HIGH (ref 4.0–10.5)
nRBC: 0 % (ref 0.0–0.2)

## 2021-12-27 LAB — MAGNESIUM: Magnesium: 2.3 mg/dL (ref 1.7–2.4)

## 2021-12-27 LAB — TROPONIN I (HIGH SENSITIVITY)
Troponin I (High Sensitivity): 20 ng/L — ABNORMAL HIGH (ref <18)
Troponin I (High Sensitivity): 23 ng/L — ABNORMAL HIGH (ref <18)

## 2021-12-27 LAB — STREP PNEUMONIAE URINARY ANTIGEN: Strep Pneumo Urinary Antigen: NEGATIVE

## 2021-12-27 MED ORDER — MORPHINE SULFATE (PF) 2 MG/ML IV SOLN
1.0000 mg | Freq: Once | INTRAVENOUS | Status: AC
  Administered 2021-12-27: 1 mg via INTRAVENOUS
  Filled 2021-12-27: qty 1

## 2021-12-27 MED ORDER — ENOXAPARIN SODIUM 100 MG/ML IJ SOSY
1.0000 mg/kg | PREFILLED_SYRINGE | INTRAMUSCULAR | Status: DC
  Administered 2021-12-27: 100 mg via SUBCUTANEOUS
  Filled 2021-12-27 (×2): qty 1

## 2021-12-27 MED ORDER — METHYLPREDNISOLONE SODIUM SUCC 40 MG IJ SOLR
40.0000 mg | Freq: Two times a day (BID) | INTRAMUSCULAR | Status: DC
  Administered 2021-12-27 – 2021-12-28 (×2): 40 mg via INTRAVENOUS
  Filled 2021-12-27 (×2): qty 1

## 2021-12-27 MED ORDER — MORPHINE SULFATE (PF) 2 MG/ML IV SOLN
1.0000 mg | INTRAVENOUS | Status: DC | PRN
  Administered 2021-12-27 (×2): 1 mg via INTRAVENOUS
  Administered 2021-12-28: 2 mg via INTRAVENOUS
  Administered 2021-12-28: 1 mg via INTRAVENOUS
  Administered 2021-12-28: 2 mg via INTRAVENOUS
  Filled 2021-12-27 (×5): qty 1

## 2021-12-27 NOTE — Progress Notes (Signed)
Removed PT from BiPAP at 1340 (PT awake)- gave scheduled nebulizer treatment. Placed PT back on BiPAP at 1344 due to increased WOB (PT states easier to breathe with BiPAP on). RN aware. ?

## 2021-12-27 NOTE — Evaluation (Signed)
SLP Cancellation Note ? ?Patient Details ?Name: Sheryl Curtis ?MRN: [id number] ?DOB: [birth year] ? ? ?Cancelled treatment:       Reason Eval/Treat Not Completed: Other (comment) (Pt currentlyon Bipap and was changed to NPO. Note plans to involve palliative care.  Messaged RN who states he will get back to me re: care plan.  Thanks.) ? ? ?[name] ?12/27/2021, 10:57 AM ? ? ? ?

## 2021-12-27 NOTE — Progress Notes (Signed)
ANTICOAGULATION CONSULT NOTE - Initial Consult ? ?Pharmacy Consult for Lovenox while Eliquis on hold ?Indication: atrial fibrillation ? ?Allergies  ?Allergen Reactions  ? Aspirin Anaphylaxis, Hives and Shortness Of Breath  ? Hydroxyquinolines Hives  ? Imipramine Hcl Other (See Comments)  ?  Unknown reaction ?Other reaction(s): weakness/groggy  ? Pamelor [Nortriptyline Hcl] Other (See Comments)  ?  Unknown reaction  ? Prednisone Shortness Of Breath, Itching and Other (See Comments)  ?  anxiety  ? Pregabalin Other (See Comments)  ?  Gives her the shakes  ? Procardia [Nifedipine] Other (See Comments)  ?  Unknown reaction  ? Tramadol Swelling  ? Ace Inhibitors Other (See Comments)  ?  cough  ? Hydroxyzine Hcl Itching  ? Influenza Vaccines Other (See Comments)  ?  High fever, couldn't get out of bed  ? Morphine And Related Other (See Comments)  ?  Felt 'out of sorts'  ? Oxycodone-Acetaminophen   ?  Other reaction(s): Brain fog  ? Gabapentin Anxiety  ? Hydroxychloroquine Sulfate Rash  ?  Other reaction(s): pruritis  ? ? ?Patient Measurements: ?  ?Height 64 inches ?Weight 99.3 kg ? ?Vital Signs: ?Temp: 97.4 ?F (36.3 ?C) (03/02 0715) ?Temp Source: Axillary (03/02 0715) ?BP: 171/88 (03/02 1115) ?Pulse Rate: 103 (03/02 1115) ? ?Labs: ?Recent Labs  ?  12/25/21 ?1208 01/25/2022 ?0702 01/18/2022 ?1302 12/27/21 ?0237  ?HGB 13.2 13.4  --  12.4  ?HCT 39.9 40.6  --  38.3  ?PLT 247.0 251  --  278  ?CREATININE 1.22* 1.23*  --  1.69*  ?TROPONINIHS  --  44* 30*  --   ? ? ?Estimated Creatinine Clearance: 26.8 mL/min (A) (by C-G formula based on SCr of 1.69 mg/dL (H)). ? ? ?Medical History: ?Past Medical History:  ?Diagnosis Date  ? ALLERGIC RHINITIS   ? Asthma   ? CAD (coronary artery disease)   ? CANCER, COLORECTAL   ? Chronic kidney disease, stage 3b ([place])   ? Chronic respiratory failure ([place])   ? CKD (chronic kidney disease)   ? Essential hypertension, benign   ? FIBROMYALGIA   ? G E R D   ? INSOMNIA   ? Morbid obesity ([place])   ? PAF  (paroxysmal atrial fibrillation) ([place])   ? Pulmonary nodules   ? Rheumatoid arthritis ([place])   ? Sleep apnea   ? ? ?Medications:  ?Scheduled:  ? apixaban  5 mg Oral BID  ? budesonide (PULMICORT) nebulizer solution  0.5 mg Nebulization BID  ? Chlorhexidine Gluconate Cloth  6 each Topical Daily  ? diltiazem  60 mg Oral Q8H  ? DULoxetine  60 mg Oral Daily  ? furosemide  40 mg Intravenous BID  ? ipratropium-albuterol  3 mL Nebulization Q6H  ? isosorbide mononitrate  60 mg Oral Daily  ? levothyroxine  25 mcg Oral Q0600  ? mouth rinse  15 mL Mouth Rinse BID  ? methylPREDNISolone (SOLU-MEDROL) injection  40 mg Intravenous Q12H  ? rosuvastatin  20 mg Oral Daily  ? ?Infusions:  ? amiodarone 30 mg/hr (12/27/21 1100)  ? azithromycin Stopped (12/27/21 0258)  ? cefTRIAXone (ROCEPHIN)  IV Stopped (12/27/21 5277)  ? diltiazem (CARDIZEM) infusion 10 mg/hr (12/27/21 1100)  ? ?PRN: ALPRAZolam, guaiFENesin, hydrALAZINE, ipratropium-albuterol, metoprolol tartrate, ondansetron **OR** ondansetron (ZOFRAN) IV, senna-docusate ? ?Assessment: ?88 yo female on chronic Eliquis for hx afib. Admitted 3/1 with worsening hypoxia requiring BiPAP. Currently on diltiazem and amiodarone drips per Cardiology. ? ?Eliquis now on hold for NPO status after possible aspiration. Pharmacy  consulted to dose Lovenox.  CBC wnl, no bleeding noted.  Hx CKDIIIB with SCr currently above baseline (1.04-1.2). ? ?Goal of Therapy:  ?Anti-Xa level 0.6-1 units/ml 4hrs after LMWH dose given ?Monitor platelets by anticoagulation protocol: Yes ?  ?Plan:  ?DC Eliquis ?Lovenox 1mg /kg SQ q24h for CrCl < 30 ml/min ?Monitor renal function and adjust as needed ?Monitor CBC, signs/symptoms of bleeding ? ?Peggyann Juba, PharmD, BCPS ?Pharmacy: [phone] ?12/27/2021,11:26 AM ? ? ?

## 2021-12-27 NOTE — Progress Notes (Signed)
[facility] ? ?PROGRESS NOTE ? ?Sheryl Curtis OQH:[id number] DOB: 01-27-1932 DOA: 01/10/2022 ?PCP: Sheryl Leyden, MD ? ? ?Brief HPI:   ?88 y.o. female with medical history significant of asthma/chronic hypoxia on 3 L nasal cannula, CKD stage IIIb, pulmonary nodule, severe rheumatoid arthritis, CAD status post PCI, paroxysmal A-fib on Eliquis, OSA, sleep apnea, HTN, colon cancer brought to the hospital for evaluation of shortness of breath.  In the ED she was noted to have asthma exacerbation along with component of CHF.  Placed on BiPAP.  Also found to be in A-fib with RVR.  Both cardiology and pulmonology were consulted. ? ? ? ?Subjective  ? ?Patient continues to be on BiPAP, briefly she was taken off BiPAP but had episode of aspiration.  Pulmonology following, recommended palliative care consult for goals of care. ? ? Assessment/Plan:  ? ? ?Acute hypoxemic respiratory failure ?-Likely from bibasilar pneumonia ?-Patient is currently on BiPAP ?-Continue ceftriaxone, Zithromax ?-Continue Solu-Medrol ?-Swallow evaluation ordered per pulmonology ? ?Atrial fibrillation with RVR ?-Patient was seen by cardiology, started on amiodarone infusion ?-Continue Cardizem gtt. ? ?Acute on chronic diastolic CHF ?-Patient started on Lasix 40 mg IV twice daily ?-Echocardiogram from 2022 showed EF of 65 to 70% with moderate LVH ?-Cardiology following ? ?Acute kidney injury on CKD stage III ?-Creatinine went up to 1.69 today ?-Secondary to diuresis with Lasix ?-Follow renal function in a.m. ? ?Chest pain ?-Patient episode of chest pain this evening ?-EKG showed A-fib with RVR with septal infarct ?-Chest pain resolved after she received morphine ?-Troponin was 20, follow serial troponin ?-Patient not a candidate for aggressive measures ? ?Goals of care ?-Palliative care consulted for goals of care, patient is DNR ?-If no improvement, plan to transition to comfort care ? ? ? ?Medications ? ?  ? budesonide (PULMICORT) nebulizer solution   0.5 mg Nebulization BID  ? Chlorhexidine Gluconate Cloth  6 each Topical Daily  ? diltiazem  60 mg Oral Q8H  ? DULoxetine  60 mg Oral Daily  ? enoxaparin (LOVENOX) injection  1 mg/kg Subcutaneous Q24H  ? furosemide  40 mg Intravenous BID  ? ipratropium-albuterol  3 mL Nebulization Q6H  ? isosorbide mononitrate  60 mg Oral Daily  ? levothyroxine  25 mcg Oral Q0600  ? mouth rinse  15 mL Mouth Rinse BID  ? methylPREDNISolone (SOLU-MEDROL) injection  40 mg Intravenous Q12H  ? rosuvastatin  20 mg Oral Daily  ? ? ? Data Reviewed:  ? ?CBG: ? ?Recent Labs  ?Lab 12/27/21 ?5465  ?GLUCAP 225*  ? ? ?SpO2: 97 % ?O2 Flow Rate (L/min): 8 L/min ?FiO2 (%): 36 % (4 lpm dep)  ? ? ?Vitals:  ? 12/27/21 1600 12/27/21 1614 12/27/21 1620 12/27/21 1811  ?BP: (!) 175/92  (!) 165/102 (!) 181/112  ?Pulse: (!) 105 (!) 114 (!) 113   ?Resp: (!) 28 (!) 27 (!) 28   ?Temp:      ?TempSrc:      ?SpO2: 97% 97% 97%   ? ? ? ? ?Data Reviewed: ? ?Basic Metabolic Panel: ?Recent Labs  ?Lab 12/21/21 ?2023 12/25/21 ?1208 01/16/2022 ?6812 12/27/21 ?7517  ?NA 136 134* 133* 130*  ?K 3.6 3.4* 3.2* 4.1  ?CL 98 96 96* 95*  ?CO2 28 30 28 23   ?GLUCOSE 163* 134* 156* 252*  ?BUN 20 26* 21 33*  ?CREATININE 1.21* 1.22* 1.23* 1.69*  ?CALCIUM 9.0 9.5 8.9 8.5*  ?MG  --   --  2.6* 2.3  ? ? ?CBC: ?Recent  Labs  ?Lab 12/21/21 ?2023 12/25/21 ?1208 01/06/2022 ?5188 12/27/21 ?4166  ?WBC 14.9* 15.6* 18.3* 21.5*  ?NEUTROABS 11.6* 13.6* 14.5*  --   ?HGB 12.6 13.2 13.4 12.4  ?HCT 39.3 39.9 40.6 38.3  ?MCV 94.7 89.4 91.6 93.4  ?PLT 179 247.0 251 278  ? ? ?LFT ?Recent Labs  ?Lab 12/25/21 ?1208 01/02/2022 ?0702 12/27/21 ?0237  ?AST 20 25 29   ?ALT 16 22 34  ?ALKPHOS 55 55 48  ?BILITOT 0.5 0.5 0.2*  ?PROT 7.1 7.2 6.6  ?ALBUMIN 4.0 3.9 3.5  ? ?  ?Antibiotics: ?Anti-infectives (From admission, onward)  ? ? Start     Dose/Rate Route Frequency Ordered Stop  ? 12/27/21 0900  cefTRIAXone (ROCEPHIN) 1 g in sodium chloride 0.9 % 100 mL IVPB       ? 1 g ?200 mL/hr over 30 Minutes Intravenous Every 24  hours 01/24/2022 1035    ? 01/17/2022 0830  azithromycin (ZITHROMAX) 500 mg in sodium chloride 0.9 % 250 mL IVPB       ? 500 mg ?250 mL/hr over 60 Minutes Intravenous Every 24 hours 01/22/2022 0823    ? 01/01/2022 0815  cefTRIAXone (ROCEPHIN) 1 g in sodium chloride 0.9 % 100 mL IVPB       ? 1 g ?200 mL/hr over 30 Minutes Intravenous  Once 01/05/2022 0814 01/06/2022 0938  ? 01/24/2022 0815  azithromycin (ZITHROMAX) tablet 500 mg  Status:  Discontinued       ? 500 mg Oral  Once 01/25/2022 0630 12/31/2021 1601  ? ?  ? ? ? ?DVT prophylaxis: Lovenox ? ?Code Status: DNR ? ?Family Communication: Discussed with family at bedside ? ? ?CONSULTS cardiology, pulmonology ? ? ?Objective  ? ? ?Physical Examination: ? ? ?General-appears in no acute distress ?Heart-S1-S2, regular, no murmur auscultated ?Lungs-decreased breath sounds bilaterally ?Abdomen-soft, nontender, no organomegaly ?Extremities-no edema in the lower extremities ?Neuro-alert, oriented x3, no focal deficit noted ? ? ?Status is: Inpatient: Patient admitted with acute hypoxemic respiratory failure ? ? ? ?  ? ? ? ? ? ? ? ?[name] ?  ?[facility] ?If 7PM-7AM, please contact night-coverage at [email or web address], ?Office  [phone] ? ? ?12/27/2021, 6:45 PM  LOS: 1 day  ? ? ? ? ? ? ? ? ? ? ?  ?

## 2021-12-27 NOTE — Progress Notes (Signed)
After drinking water with pills, patient began coughing a lot and spitting the water up. Patient stated that she doesn't feel nauseous but she is having trouble swallowing. Patient then began feeling short of breath. Respiratory was called. NP contacted and speech consult placed.   ?

## 2021-12-27 NOTE — Progress Notes (Signed)
Name: Sheryl Curtis MRN: [id number] DOB: [birth year]    ADMISSION DATE:  01/20/2022 CONSULTATION DATE:  12/27/2021   REFERRING MD :  Karle Starch, EDP  CHIEF COMPLAINT: Respiratory distress   HISTORY OF PRESENT ILLNESS: 88 year old remote smoker was followed in the office for 'COPD', OSA and chronic respiratory failure requiring 2 L of oxygen. She has declined over the past few months.  She  also has chronic back pain and arrives in a wheelchair for the  last few visits. Surprisingly PFTs have not shown significant airway obstruction HRCT in the past has not shown any sign of ILD  She was sent to the ER on 2/23 treated for atrial fibrillation with RVR again on 2/23 treated for elevated blood pressure.  Cardiology changed Cardizem to 120 twice daily. She was started on Augmentin by PCP for sinus infection in 2/24.  She was seen in the pulmonary clinic by my partner 2/28 for shortness of breath and requiring 4 L which is worse than her baseline 2 L .  Hospitalization was advised as a direct admit.  She was given Solu-Medrol 80 and DuoNebs  She was brought in by EMS this morning for worsening hypoxia and placed on BiPAP in the emergency room due to increased work of breathing She received 125 Solu-Medrol by EMS in route with nebs Initial testing showed BNP 237, leukocytosis 16 K, COVID flu was negative. Chest x-ray independently reviewed shows bibasilar pneumonia  Blood pressure was, highest 205/176, started on Cardizem drip for atrial fibrillation/RVR.  Given 5 of labetalol and 40 of IV Lasix  PAST MEDICAL HISTORY :  severe rheumatoid arthritis, GERD, CKD, atrial fibrillation, obesity, hypertension  PFTs - 12/2014 - no airway obstruction, ratio 71, pos BD response PFTs 11/2018, and mild airway obstruction with FEV1 79%, TLC 101%, DLCO normal, no bronchodilator response HRCT 12/2020 no ILD, minimal increase in size of multiple pulm nodules  SUBJECTIVE: Came off BiPAP last night Had a choking  episode with coughing after drinking water with pills, followed by respiratory distress and placed back on BiPAP early a.m. Afebrile    VITAL SIGNS: Temp:  [96.8 F (36 C)-97.9 F (36.6 C)] 97.4 F (36.3 C) (03/02 0715) Pulse Rate:  [30-154] 99 (03/02 0800) Resp:  [12-34] 22 (03/02 0800) BP: (122-195)/(53-146) 173/66 (03/02 0800) SpO2:  [90 %-99 %] 97 % (03/02 0800) FiO2 (%):  [36 %-45 %] 36 % (03/02 0805)  PHYSICAL EXAMINATION: Gen. elderly obese, lying supine on BiPAP fullface mask ENT - no lesions, no post nasal drip Neck: No JVD, no thyromegaly, no carotid bruits Lungs: mild accessory muscle use , crackles right base, scattered rhonchi Cardiovascular: S1-S2 irregular,  no murmurs, 1+ peripheral edema Abdomen: soft and non-tender, no hepatosplenomegaly, BS normal. Musculoskeletal: No deformities, no cyanosis or clubbing Neuro: Sleeping on BiPAP, nonfocal Skin:  Warm, no lesions/ rash   Recent Labs  Lab 12/25/21 1208 12/27/2021 0702 12/27/21 0237  NA 134* 133* 130*  K 3.4* 3.2* 4.1  CL 96 96* 95*  CO2 30 28 23   BUN 26* 21 33*  CREATININE 1.22* 1.23* 1.69*  GLUCOSE 134* 156* 252*    Recent Labs  Lab 12/25/21 1208 01/04/2022 0702 12/27/21 0237  HGB 13.2 13.4 12.4  HCT 39.9 40.6 38.3  WBC 15.6* 18.3* 21.5*  PLT 247.0 251 278    DG Chest 2 View  Result Date: 12/27/2021 CLINICAL DATA:  Shortness of breath. EXAM: CHEST - 2 VIEW COMPARISON:  Chest radiograph 12/20/2021. FINDINGS: Cardiac contours upper  limits of normal. Diffuse bilateral interstitial pulmonary opacities. Small bilateral pleural effusions. No pneumothorax. Thoracic spine degenerative changes. IMPRESSION: Cardiomegaly and mild interstitial edema. Electronically Signed   By: Lovey Newcomer M.D.   On: 01/20/2022 20:54   DG Chest Port 1 View  Result Date: 01/02/2022 CLINICAL DATA:  Worsening shortness of breath EXAM: PORTABLE CHEST 1 VIEW COMPARISON:  12/25/2021 FINDINGS: Bilateral lower lobe airspace disease  concerning for pneumonia. No pleural effusion or pneumothorax. Stable cardiomediastinal silhouette. No aggressive osseous lesion. IMPRESSION: 1. Bilateral lower lobe airspace disease concerning for pneumonia. Electronically Signed   By: Kathreen Devoid M.D.   On: 01/14/2022 07:44   ECHOCARDIOGRAM COMPLETE  Result Date: 01/21/2022    ECHOCARDIOGRAM REPORT   Patient Name:   Sheryl Curtis Date of Exam: 01/11/2022 Medical Rec #:  [id number]     Height:       64.0 in Accession #:    [id number]    Weight:       219.0 lb Date of Birth:  Nov 15, 1931     BSA:          2.033 m Patient Age:    11 years      BP:           143/102 mmHg Patient Gender: F             HR:           95 bpm. Exam Location:  Inpatient Procedure: 2D Echo, Cardiac Doppler, Color Doppler and Intracardiac            Opacification Agent Indications:    I48.91 AFIB  History:        Patient has prior history of Echocardiogram examinations, most                 recent 08/19/2021. CAD, Arrythmias:Atrial Fibrillation; Risk                 Factors:Hypertension. RESP. FAILURE / MORBID OBESITY.  Sonographer:    Beryle Beams Referring Phys: [id number] Cocke  Sonographer Comments: Suboptimal parasternal window, suboptimal apical window, suboptimal subcostal window and patient is morbidly obese. Image acquisition challenging due to patient body habitus, Image acquisition challenging due to respiratory motion and Image acquisition challenging due to COPD. IMPRESSIONS  1. Left ventricular ejection fraction, by estimation, is 60 to 65%. The left ventricle has normal function. The left ventricle has no regional wall motion abnormalities. Left ventricular diastolic function could not be evaluated.  2. Right ventricular systolic function was not well visualized. The right ventricular size is not well visualized.  3. The mitral valve is grossly normal. Mild mitral valve regurgitation.  4. The aortic valve is grossly normal. Aortic valve regurgitation is not visualized.  FINDINGS  Left Ventricle: Left ventricular ejection fraction, by estimation, is 60 to 65%. The left ventricle has normal function. The left ventricle has no regional wall motion abnormalities. The left ventricular internal cavity size was normal in size. There is  no left ventricular hypertrophy. Left ventricular diastolic function could not be evaluated due to atrial fibrillation. Left ventricular diastolic function could not be evaluated. Right Ventricle: The right ventricular size is not well visualized. Right vetricular wall thickness was not well visualized. Right ventricular systolic function was not well visualized. Left Atrium: Left atrial size was normal in size. Right Atrium: Right atrial size was normal in size. Pericardium: There is no evidence of pericardial effusion. Mitral Valve: The mitral valve is grossly normal. Mild mitral  valve regurgitation. Tricuspid Valve: The tricuspid valve is not well visualized. Tricuspid valve regurgitation is not demonstrated. Aortic Valve: The aortic valve is grossly normal. Aortic valve regurgitation is not visualized. Aortic valve mean gradient measures 3.0 mmHg. Aortic valve peak gradient measures 6.1 mmHg. Aortic valve area, by VTI measures 1.71 cm. Pulmonic Valve: The pulmonic valve was normal in structure. Pulmonic valve regurgitation is not visualized. Aorta: The aortic root and ascending aorta are structurally normal, with no evidence of dilitation. IAS/Shunts: The atrial septum is grossly normal.  LEFT VENTRICLE PLAX 2D LVIDd:         4.80 cm LVIDs:         2.50 cm LV PW:         1.30 cm LV IVS:        1.20 cm LVOT diam:     1.70 cm LV SV:         48 LV SV Index:   24 LVOT Area:     2.27 cm  LV Volumes (MOD) LV vol d, MOD A2C: 77.4 ml LV vol d, MOD A4C: 90.6 ml LV vol s, MOD A2C: 40.3 ml LV vol s, MOD A4C: 51.2 ml LV SV MOD A2C:     37.1 ml LV SV MOD A4C:     90.6 ml LV SV MOD BP:      39.3 ml RIGHT VENTRICLE            IVC RV S prime:     8.16 cm/s  IVC  diam: 2.20 cm TAPSE (M-mode): 1.2 cm LEFT ATRIUM             Index        RIGHT ATRIUM           Index LA diam:        4.40 cm 2.16 cm/m   RA Area:     14.70 cm LA Vol (A2C):   53.6 ml 26.36 ml/m  RA Volume:   32.20 ml  15.84 ml/m LA Vol (A4C):   51.2 ml 25.18 ml/m LA Biplane Vol: 57.5 ml 28.28 ml/m  AORTIC VALVE                    PULMONIC VALVE AV Area (Vmax):    1.65 cm     PV Vmax:       0.70 m/s AV Area (Vmean):   1.76 cm     PV Vmean:      45.200 cm/s AV Area (VTI):     1.71 cm     PV VTI:        0.122 m AV Vmax:           123.00 cm/s  PV Peak grad:  2.0 mmHg AV Vmean:          80.800 cm/s  PV Mean grad:  1.0 mmHg AV VTI:            0.280 m AV Peak Grad:      6.1 mmHg AV Mean Grad:      3.0 mmHg LVOT Vmax:         89.50 cm/s LVOT Vmean:        62.700 cm/s LVOT VTI:          0.211 m LVOT/AV VTI ratio: 0.75  AORTA Ao Root diam: 2.80 cm Ao Asc diam:  2.60 cm  SHUNTS Systemic VTI:  0.21 m Systemic Diam: 1.70 cm Mertie Moores MD Electronically signed by Mertie Moores  MD Signature Date/Time: 01/05/2022/3:20:37 PM    Final     ASSESSMENT / PLAN:  Acute hypoxic respiratory failure, oxygen needs have increased from baseline 2 L, requiring BiPAP for work of breathing -Chest x-ray suggestive of bibasilar pneumonia, some interstitial infiltrates are chronic, previous HRCT has not shown ILD, - she was recently on Augmentin,Leukocytosis could be related to steroids, low procalcitonin is reassuring.  Aspiration episodes which are more evident during this hospitalization could be the cause -This does not appear to be a COPD exacerbation   Plan -Continue ceftriaxone/azithromycin -Obtain urine strep antigen and Legionella for completion -IV Solu-Medrol can be decreased to 40 every 12 -BiPAP as needed for work of breathing  Aspiration into airway -Keep n.p.o. until swallow evaluation  Atrial fibrillation/RVR Acute on chronic diastolic heart failure -Cardizem plus amiodarone drip, being managed by  cardiology -Negative balance  Goals of care -failure to thrive ,she would not want any forms of life support she has declined over the past few months and has been debilitated by chronic pain DNR will be issued Involve palliative care   Kara Mead MD. FCCP. [facility] Pager [phone] If no response call [phone]    12/27/2021, 9:18 AM

## 2021-12-27 NOTE — Progress Notes (Addendum)
Progress Note  Patient Name: Sheryl Curtis Date of Encounter: 12/27/2021  [facility] Cardiologist: Larae Grooms, MD   Subjective   Pt sleeping on BiPAP, daughter at bedside  Inpatient Medications    Scheduled Meds:  apixaban  5 mg Oral BID   budesonide (PULMICORT) nebulizer solution  0.5 mg Nebulization BID   Chlorhexidine Gluconate Cloth  6 each Topical Daily   diltiazem  60 mg Oral Q8H   DULoxetine  60 mg Oral Daily   furosemide  40 mg Intravenous BID   ipratropium-albuterol  3 mL Nebulization Q6H   isosorbide mononitrate  60 mg Oral Daily   levothyroxine  25 mcg Oral Q0600   mouth rinse  15 mL Mouth Rinse BID   methylPREDNISolone (SOLU-MEDROL) injection  40 mg Intravenous Q12H   rosuvastatin  20 mg Oral Daily   Continuous Infusions:  amiodarone 30 mg/hr (12/27/21 0905)   azithromycin Stopped (12/27/21 0851)   cefTRIAXone (ROCEPHIN)  IV Stopped (12/27/21 0949)   diltiazem (CARDIZEM) infusion 10 mg/hr (12/27/21 0905)   PRN Meds: ALPRAZolam, guaiFENesin, hydrALAZINE, ipratropium-albuterol, metoprolol tartrate, ondansetron **OR** ondansetron (ZOFRAN) IV, senna-docusate   Vital Signs    Vitals:   12/27/21 0758 12/27/21 0800 12/27/21 0900 12/27/21 1006  BP:  (!) 173/66 (!) 133/58 (!) 160/70  Pulse:  99 (!) 102 (!) 108  Resp:  (!) 22 (!) 34 (!) 27  Temp:      TempSrc:      SpO2: 96% 97% 95% 93%    Intake/Output Summary (Last 24 hours) at 12/27/2021 1015 Last data filed at 12/27/2021 0905 Gross per 24 hour  Intake 1217.28 ml  Output 850 ml  Net 367.28 ml   Last 3 Weights 12/25/2021 12/21/2021 12/20/2021  Weight (lbs) 219 lb 225 lb 219 lb  Weight (kg) 99.338 kg 102.059 kg 99.338 kg      Telemetry    Afib in the 90-100s - Personally Reviewed  ECG    No new tracings - Personally Reviewed  Physical Exam   GEN: sleeping with BiPAP in place Neck: No JVD Cardiac: irregular rhythm, regular rate Respiratory: diminished with crackles in bases GI: Soft,  nontender, non-distended  MS: No edema; No deformity. Neuro:  Nonfocal  Psych: Normal affect   Labs    High Sensitivity Troponin:   Recent Labs  Lab 12/20/21 1318 12/20/21 1547 01/19/2022 0702 01/21/2022 1302  TROPONINIHS 31* 38* 44* 30*     Chemistry Recent Labs  Lab 12/21/21 2023 12/25/21 1208 01/09/2022 0702 12/27/21 0237  NA 136 134* 133* 130*  K 3.6 3.4* 3.2* 4.1  CL 98 96 96* 95*  CO2 28 30 28 23   GLUCOSE 163* 134* 156* 252*  BUN 20 26* 21 33*  CREATININE 1.21* 1.22* 1.23* 1.69*  CALCIUM 9.0 9.5 8.9 8.5*  MG  --   --  2.6* 2.3  PROT  --  7.1 7.2 6.6  ALBUMIN  --  4.0 3.9 3.5  AST  --  20 25 29   ALT  --  16 22 34  ALKPHOS  --  55 55 48  BILITOT  --  0.5 0.5 0.2*  GFRNONAA 43*  --  43* 29*  ANIONGAP 10  --  9 12    Lipids No results for input(s): CHOL, TRIG, HDL, LABVLDL, LDLCALC, CHOLHDL in the last 168 hours.  Hematology Recent Labs  Lab 12/21/21 2023 12/25/21 1208 01/23/2022 0702 12/27/21 0237  WBC 14.9* 15.6* 18.3* 21.5*  RBC 4.15 4.46 4.43 4.10  HGB 12.6 13.2 13.4 12.4  HCT 39.3 39.9 40.6 38.3  MCV 94.7 89.4 91.6 93.4  MCH 30.4  --  30.2 30.2  MCHC 32.1 33.1 33.0 32.4  RDW 14.2 14.7 14.7 14.9  PLT 179 247.0 251 278   Thyroid  Recent Labs  Lab 01/01/2022 1302  TSH 2.255    BNP Recent Labs  Lab 12/20/21 2123 12/25/21 1208 12/31/2021 0703  BNP 112.9*  --  237.3*  PROBNP  --  237.0*  --     DDimer No results for input(s): DDIMER in the last 168 hours.   Radiology    DG Chest 2 View  Result Date: 01/08/2022 CLINICAL DATA:  Shortness of breath. EXAM: CHEST - 2 VIEW COMPARISON:  Chest radiograph 12/20/2021. FINDINGS: Cardiac contours upper limits of normal. Diffuse bilateral interstitial pulmonary opacities. Small bilateral pleural effusions. No pneumothorax. Thoracic spine degenerative changes. IMPRESSION: Cardiomegaly and mild interstitial edema. Electronically Signed   By: Lovey Newcomer M.D.   On: 01/22/2022 20:54   DG Chest Port 1  View  Result Date: 12/31/2021 CLINICAL DATA:  Worsening shortness of breath EXAM: PORTABLE CHEST 1 VIEW COMPARISON:  12/25/2021 FINDINGS: Bilateral lower lobe airspace disease concerning for pneumonia. No pleural effusion or pneumothorax. Stable cardiomediastinal silhouette. No aggressive osseous lesion. IMPRESSION: 1. Bilateral lower lobe airspace disease concerning for pneumonia. Electronically Signed   By: Kathreen Devoid M.D.   On: 01/04/2022 07:44   ECHOCARDIOGRAM COMPLETE  Result Date: 01/04/2022    ECHOCARDIOGRAM REPORT   Patient Name:   Sheryl Curtis Date of Exam: 12/31/2021 Medical Rec #:  [id number]     Height:       64.0 in Accession #:    [id number]    Weight:       219.0 lb Date of Birth:  1932/01/19     BSA:          2.033 m Patient Age:    88 years      BP:           143/102 mmHg Patient Gender: F             HR:           95 bpm. Exam Location:  Inpatient Procedure: 2D Echo, Cardiac Doppler, Color Doppler and Intracardiac            Opacification Agent Indications:    I48.91 AFIB  History:        Patient has prior history of Echocardiogram examinations, most                 recent 08/19/2021. CAD, Arrythmias:Atrial Fibrillation; Risk                 Factors:Hypertension. RESP. FAILURE / MORBID OBESITY.  Sonographer:    Beryle Beams Referring Phys: [address] [place]  Sonographer Comments: Suboptimal parasternal window, suboptimal apical window, suboptimal subcostal window and patient is morbidly obese. Image acquisition challenging due to patient body habitus, Image acquisition challenging due to respiratory motion and Image acquisition challenging due to COPD. IMPRESSIONS  1. Left ventricular ejection fraction, by estimation, is 60 to 65%. The left ventricle has normal function. The left ventricle has no regional wall motion abnormalities. Left ventricular diastolic function could not be evaluated.  2. Right ventricular systolic function was not well visualized. The right ventricular size is not well  visualized.  3. The mitral valve is grossly normal. Mild mitral valve regurgitation.  4. The aortic valve is grossly normal.  Aortic valve regurgitation is not visualized. FINDINGS  Left Ventricle: Left ventricular ejection fraction, by estimation, is 60 to 65%. The left ventricle has normal function. The left ventricle has no regional wall motion abnormalities. The left ventricular internal cavity size was normal in size. There is  no left ventricular hypertrophy. Left ventricular diastolic function could not be evaluated due to atrial fibrillation. Left ventricular diastolic function could not be evaluated. Right Ventricle: The right ventricular size is not well visualized. Right vetricular wall thickness was not well visualized. Right ventricular systolic function was not well visualized. Left Atrium: Left atrial size was normal in size. Right Atrium: Right atrial size was normal in size. Pericardium: There is no evidence of pericardial effusion. Mitral Valve: The mitral valve is grossly normal. Mild mitral valve regurgitation. Tricuspid Valve: The tricuspid valve is not well visualized. Tricuspid valve regurgitation is not demonstrated. Aortic Valve: The aortic valve is grossly normal. Aortic valve regurgitation is not visualized. Aortic valve mean gradient measures 3.0 mmHg. Aortic valve peak gradient measures 6.1 mmHg. Aortic valve area, by VTI measures 1.71 cm. Pulmonic Valve: The pulmonic valve was normal in structure. Pulmonic valve regurgitation is not visualized. Aorta: The aortic root and ascending aorta are structurally normal, with no evidence of dilitation. IAS/Shunts: The atrial septum is grossly normal.  LEFT VENTRICLE PLAX 2D LVIDd:         4.80 cm LVIDs:         2.50 cm LV PW:         1.30 cm LV IVS:        1.20 cm LVOT diam:     1.70 cm LV SV:         48 LV SV Index:   24 LVOT Area:     2.27 cm  LV Volumes (MOD) LV vol d, MOD A2C: 77.4 ml LV vol d, MOD A4C: 90.6 ml LV vol s, MOD A2C: 40.3 ml LV  vol s, MOD A4C: 51.2 ml LV SV MOD A2C:     37.1 ml LV SV MOD A4C:     90.6 ml LV SV MOD BP:      39.3 ml RIGHT VENTRICLE            IVC RV S prime:     8.16 cm/s  IVC diam: 2.20 cm TAPSE (M-mode): 1.2 cm LEFT ATRIUM             Index        RIGHT ATRIUM           Index LA diam:        4.40 cm 2.16 cm/m   RA Area:     14.70 cm LA Vol (A2C):   53.6 ml 26.36 ml/m  RA Volume:   32.20 ml  15.84 ml/m LA Vol (A4C):   51.2 ml 25.18 ml/m LA Biplane Vol: 57.5 ml 28.28 ml/m  AORTIC VALVE                    PULMONIC VALVE AV Area (Vmax):    1.65 cm     PV Vmax:       0.70 m/s AV Area (Vmean):   1.76 cm     PV Vmean:      45.200 cm/s AV Area (VTI):     1.71 cm     PV VTI:        0.122 m AV Vmax:           123.00 cm/s  PV Peak grad:  2.0 mmHg AV Vmean:          80.800 cm/s  PV Mean grad:  1.0 mmHg AV VTI:            0.280 m AV Peak Grad:      6.1 mmHg AV Mean Grad:      3.0 mmHg LVOT Vmax:         89.50 cm/s LVOT Vmean:        62.700 cm/s LVOT VTI:          0.211 m LVOT/AV VTI ratio: 0.75  AORTA Ao Root diam: 2.80 cm Ao Asc diam:  2.60 cm  SHUNTS Systemic VTI:  0.21 m Systemic Diam: 1.70 cm Mertie Moores MD Electronically signed by Mertie Moores MD Signature Date/Time: 12/27/2021/3:20:37 PM    Final     Cardiac Studies   Echo 01/11/2022:  1. Left ventricular ejection fraction, by estimation, is 60 to 65%. The  left ventricle has normal function. The left ventricle has no regional  wall motion abnormalities. Left ventricular diastolic function could not  be evaluated.   2. Right ventricular systolic function was not well visualized. The right  ventricular size is not well visualized.   3. The mitral valve is grossly normal. Mild mitral valve regurgitation.   4. The aortic valve is grossly normal. Aortic valve regurgitation is not  visualized.   Patient Profile     88 y.o. female  with a hx of CAD s/p stent to LAD 2014, paroxysmal atrial fibrillation (dx 07/2021), severe RA, asthma, pulmonary nodules, chronic  respiratory failure, obesity, GERD, CKD stage 3b, colon CA, HTN, sleep apnea, fibromyalgia who is being seen 01/24/2022 for the evaluation of atrial fibrillation and hypertension   Assessment & Plan    Atrial fibrillation with RVR PAF Chronic anticoagulation - rates are improved on IV amiodarone, but has not yet converted - will continue amiodarone gtt today, possibly convert to PO tomorrow - she is running 10 mg/hr cardizem gtt with 60 mg cardizem q8 hr - continue to wean off of cardizem gtt --> consolidate to 240 mg cardizem (takes 120 mg cardizem at home) - may also add 25 mg metoprolol BID if additional rate control needed - vs bisoprolol given lung function - continue eliquis, no signs of active bleeding, Hb stable - Afib may improve vs  convert as PNA/respiratory failure improves - we will continue to support her rate during treatment   CKD 3b - creatinine 1.69 - baseline near 1.04-1.2   Acute on chronic respiratory failure ?acute on chronic diastolic heart failure Pneumonia with leukocytosis - echo yesterday with preserved EF - pt does not appear significantly volume up, but crackles in bases - CXR concerning for PNA - leukocytosis - she is receiving 40 mg IV lasix - home dose is 20 mg lasix PO daily - given number of IV medications, continue diuresis today   Hypertension - BP remains elevated - continue to monitor as cardizem is transitioned to PO dosing   CAD s/p remote PCI - HST mild and flat, inconsistent with ACS, likely demand ischemia - no ASA in the setting of eliquis - start BB   Disposition Pt is DNR     For questions or updates, please contact [facility] Please consult [email or web address] for contact info under        Signed, Ledora Bottcher, PA  12/27/2021, 10:15 AM     Patient seen and examined.  Agree with above documentation.  On exam, patient is on Bipap, alert and oriented, irregular rhythm, no murmurs, diminished breath sounds, no LE edema.   Telemetry shows rates in Afib are controlled, 90-100s.  Continue IV amio gtt.  Continue diltiazem, will convert to PO and wean off gtt.  On therapeutic lovenox for anticoagulation.  Donato Heinz, MD

## 2021-12-27 NOTE — Consult Note (Signed)
Consultation Note Date: 12/27/2021   Patient Name: Sheryl Curtis  DOB: [birth year]  MRN: [id number]  Age / Sex: 88 y.o., female  PCP: Collene Leyden, MD Referring Physician: [name], MD  Reason for Consultation: Establishing goals of care  HPI/Patient Profile: 88 y.o. female  admitted on 01/20/2022    Clinical Assessment and Goals of Care: 88 year old lady who lives by herself in [place], [place]. Her children live nearby and take turns staying with her. She has possible COPD, OSA, chronic resp failure, on 2 L O2 [place], chronic back pain, recently in wheelchair, A fib RVR recently.  Admitted with acute hypoxic resp failure, bibasilar PNA, possibly also a component of aspiration PNA as well. Also with acute on chronic diastolic CHF and A fib with RVR.  A palliative consult has been requested for ongoing goals of care discussions.  Chart reviewed, patient seen and examined. Briefly discussed with Dr Elsworth Soho PCCM.  Patient is on BIPAP, she awakens easily, is reasonably alert, is using some accessory muscles of respiration, daughter is at bedside. Palliative medicine is specialized medical care for people living with serious illness. It focuses on providing relief from the symptoms and stress of a serious illness. The goal is to improve quality of life for both the patient and the family. Goals of care: Broad aims of medical therapy in relation to the patient's values and preferences. Our aim is to provide medical care aimed at enabling patients to achieve the goals that matter most to them, given the circumstances of their particular medical situation and their constraints.   Patient complains of a dry mouth, she was given a BIPAP break and oral care was provided by her RN. Patient's daughter provides a brief life review, she states that the patient has been having an ongoing progressive rather sub acute decline.    Goals, wishes and values important to the patient and family as a unit attempted to be explored. We talked about scope of current hospitalization. See below.   NEXT OF KIN  Daughter at bedside.   SUMMARY OF RECOMMENDATIONS   DNR/DNI. Continue current mode of care: Use of BiPAP and other current treatment as a time-limited trial.  Additional family expected to arrive within the next few days.  Daughter present at bedside aware of the serious nature of the patient's illness. Monitor hospital course and overall disease trajectory of illness so as to guide further goals of care discussions and decision making.  Discussed gently with daughter present at bedside about patient requiring full scope of comfort measures and possibly also hospice should she have ongoing worsening decline.  Thank you for the consult.  Code Status/Advance Care Planning: DNR   Symptom Management:     Palliative Prophylaxis:  Delirium Protocol    Psycho-social/Spiritual:  Desire for further Chaplaincy support:no Additional Recommendations: Caregiving  Support/Resources  Prognosis:  Guarded   Discharge Planning: To Be Determined      Primary Diagnoses: Present on Admission:  Acute respiratory distress  Chronic kidney  disease (CKD), stage III (moderate) (HCC)  Coronary atherosclerosis of native coronary artery  Essential hypertension, benign  (Resolved) G E R D  Mild persistent asthma with (acute) exacerbation  Morbid obesity ([place])  Acute hypoxemic respiratory failure ([place])   I have reviewed the medical record, interviewed the patient and family, and examined the patient. The following aspects are pertinent.  Past Medical History:  Diagnosis Date   ALLERGIC RHINITIS    Asthma    CAD (coronary artery disease)    CANCER, COLORECTAL    Chronic kidney disease, stage 3b (HCC)    Chronic respiratory failure (HCC)    CKD (chronic kidney disease)    Essential hypertension, benign    FIBROMYALGIA     G E R D    INSOMNIA    Morbid obesity (HCC)    PAF (paroxysmal atrial fibrillation) (HCC)    Pulmonary nodules    Rheumatoid arthritis (HCC)    Sleep apnea    Social History   Socioeconomic History   Marital status: Widowed    Spouse name: Not on file   Number of children: 4   Years of education: Not on file   Highest education level: Not on file  Occupational History   Not on file  Tobacco Use   Smoking status: Former    Packs/day: 1.00    Years: 12.00    Pack years: 12.00    Types: Cigarettes    Start date: 10/28/1949    Quit date: 10/28/1962    Years since quitting: 59.2   Smokeless tobacco: Never  Vaping Use   Vaping Use: Never used  Substance and Sexual Activity   Alcohol use: No   Drug use: No   Sexual activity: Not on file  Other Topics Concern   Not on file  Social History Narrative   Not on file   Social Determinants of Health   Financial Resource Strain: Not on file  Food Insecurity: No Food Insecurity   Worried About Running Out of Food in the Last Year: Never true   [place] in the Last Year: Never true  Transportation Needs: No Transportation Needs   Lack of Transportation (Medical): No   Lack of Transportation (Non-Medical): No  Physical Activity: Not on file  Stress: Not on file  Social Connections: Not on file   Family History  Problem Relation Age of Onset   CAD Brother    Heart attack Brother 102   Bone cancer Sister    Throat cancer Daughter    Scheduled Meds:  budesonide (PULMICORT) nebulizer solution  0.5 mg Nebulization BID   Chlorhexidine Gluconate Cloth  6 each Topical Daily   diltiazem  60 mg Oral Q8H   DULoxetine  60 mg Oral Daily   enoxaparin (LOVENOX) injection  1 mg/kg Subcutaneous Q24H   furosemide  40 mg Intravenous BID   ipratropium-albuterol  3 mL Nebulization Q6H   isosorbide mononitrate  60 mg Oral Daily   levothyroxine  25 mcg Oral Q0600   mouth rinse  15 mL Mouth Rinse BID   methylPREDNISolone  (SOLU-MEDROL) injection  40 mg Intravenous Q12H   rosuvastatin  20 mg Oral Daily   Continuous Infusions:  amiodarone 30 mg/hr (12/27/21 1302)   azithromycin Stopped (12/27/21 0851)   cefTRIAXone (ROCEPHIN)  IV Stopped (12/27/21 3875)   diltiazem (CARDIZEM) infusion 10 mg/hr (12/27/21 1301)   PRN Meds:.ALPRAZolam, guaiFENesin, hydrALAZINE, ipratropium-albuterol, metoprolol tartrate, morphine injection, ondansetron **OR** ondansetron (ZOFRAN) IV, senna-docusate Medications Prior to  Admission:  Prior to Admission medications   Medication Sig Start Date End Date Taking? Authorizing Provider  albuterol (PROVENTIL) (2.5 MG/3ML) 0.083% nebulizer solution USE 1 VIAL VIA NEBULIZER EVERY 6 HOURS AS NEEDED Patient taking differently: Take 2.5 mg by nebulization in the morning and at bedtime. 02/20/21  Yes Rigoberto Noel, MD  Albuterol Sulfate (PROAIR RESPICLICK) 027 (90 Base) MCG/ACT AEPB Inhale 1-2 puffs into the lungs every 6 (six) hours as needed (for shortness of breath and/or wheezing.). Patient taking differently: Inhale 1-2 puffs into the lungs in the morning and at bedtime. 10/31/20  Yes Rigoberto Noel, MD  ALPRAZolam Duanne Moron) 0.25 MG tablet Take 0.25 mg by mouth 3 (three) times daily as needed for anxiety.   Yes [provider]  amoxicillin-clavulanate (AUGMENTIN) 875-125 MG tablet Take 1 tablet by mouth 2 (two) times daily. 12/18/21  Yes [provider]  apixaban (ELIQUIS) 5 MG TABS tablet TAKE 1 TABLET(5 MG) BY MOUTH TWICE DAILY Patient taking differently: Take 5 mg by mouth 2 (two) times daily. 12/03/21  Yes Jettie Booze, MD  Budeson-Glycopyrrol-Formoterol (BREZTRI AEROSPHERE) 160-9-4.8 MCG/ACT AERO Inhale 2 puffs into the lungs in the morning and at bedtime. 09/18/20  Yes Lauraine Rinne, NP  cycloSPORINE (RESTASIS) 0.05 % ophthalmic emulsion Place 1 drop into both eyes 2 (two) times daily.   Yes [provider]  diltiazem (CARDIZEM CD) 120 MG 24 hr capsule Take 1  capsule (120 mg total) by mouth in the morning and at bedtime. 12/21/21  Yes Sherren Mocha, MD  DULoxetine (CYMBALTA) 60 MG capsule Take 60 mg by mouth daily. 09/25/17  Yes [provider]  furosemide (LASIX) 40 MG tablet Take 0.5 tablets (20 mg total) by mouth daily. Check BMP follow-up with PCP before resuming Patient taking differently: Take 20 mg by mouth daily. 08/27/21  Yes Antonieta Pert, MD  isosorbide mononitrate (IMDUR) 60 MG 24 hr tablet TAKE 1 TABLET BY MOUTH  DAILY Patient taking differently: 60 mg daily. 09/27/21  Yes Jettie Booze, MD  levothyroxine (SYNTHROID) 25 MCG tablet Take 25 mcg by mouth daily before breakfast.   Yes [provider]  Nebulizers (COMPRESSOR NEBULIZER) MISC 1 Device by Does not apply route once. 04/04/16  Yes Young, Tarri Fuller D, MD  nitroGLYCERIN (NITROSTAT) 0.4 MG SL tablet Place 1 tablet (0.4 mg total) under the tongue every 5 (five) minutes as needed for chest pain. 09/07/21  Yes Jettie Booze, MD  polyethylene glycol powder (GLYCOLAX/MIRALAX) 17 GM/SCOOP powder Take 17 g by mouth daily.   Yes [provider]  potassium chloride SA (KLOR-CON) 20 MEQ tablet Take 20 mEq by mouth daily. 08/31/21  Yes [provider]  psyllium (METAMUCIL) 58.6 % powder Take 1 packet by mouth daily as needed (constipation).   Yes [provider]  Respiratory Therapy Supplies (FLUTTER) DEVI Use flutter device 3 times a day 07/22/18  Yes Martyn Ehrich, NP  rosuvastatin (CRESTOR) 20 MG tablet TAKE 1 TABLET BY MOUTH  DAILY Patient taking differently: Take 20 mg by mouth daily. 11/23/20  Yes Jettie Booze, MD   Allergies  Allergen Reactions   Aspirin Anaphylaxis, Hives and Shortness Of Breath   Hydroxyquinolines Hives   Imipramine Hcl Other (See Comments)    Unknown reaction Other reaction(s): weakness/groggy   Pamelor [Nortriptyline Hcl] Other (See Comments)    Unknown reaction   Prednisone Shortness Of Breath,  Itching and Other (See Comments)    anxiety   Pregabalin Other (See Comments)  Gives her the shakes   Procardia [Nifedipine] Other (See Comments)    Unknown reaction   Tramadol Swelling   Ace Inhibitors Other (See Comments)    cough   Hydroxyzine Hcl Itching   Influenza Vaccines Other (See Comments)    High fever, couldn't get out of bed   Morphine And Related Other (See Comments)    Felt 'out of sorts'   Oxycodone-Acetaminophen     Other reaction(s): Brain fog   Gabapentin Anxiety   Hydroxychloroquine Sulfate Rash    Other reaction(s): pruritis   Review of Systems Patient complains of a dry mouth while wearing BIPAP mask.   Physical Exam Elderly appearing lady Mild distress On BIPAP mask Scattered rhonchi S 1 S 2 irregular Has some trace edema Is awake alert, attempts to talk with her BIPAP mask on.   Vital Signs: BP (!) 146/83    Pulse (!) 122    Temp 97.7 F (36.5 C) (Axillary)    Resp (!) 31    SpO2 97%  Pain Scale: 0-10   Pain Score: Asleep   SpO2: SpO2: 97 % O2 Device:SpO2: 97 % O2 Flow Rate: .O2 Flow Rate (L/min): 8 L/min  IO: Intake/output summary:  Intake/Output Summary (Last 24 hours) at 12/27/2021 1524 Last data filed at 12/27/2021 1257 Gross per 24 hour  Intake 962.98 ml  Output 1200 ml  Net -237.02 ml    LBM: Last BM Date : 12/25/21 Baseline Weight:   Most recent weight:       Palliative Assessment/Data:   PPS 40%  Time In:  1430 Time Out:  1530 Time Total:  60  Greater than 50%  of this time was spent counseling and coordinating care related to the above assessment and plan.  Signed by: Loistine Chance, MD   Please contact Palliative Medicine Team phone at [phone] for questions and concerns.  For individual provider: See Shea Evans

## 2021-12-27 NOTE — Progress Notes (Signed)
Pt off of bipap at this time and on 5Lpm [place].  Pt alert and oriented x4 and states that she feels like her breathing is back close to baseline. ?

## 2021-12-27 NOTE — TOC Initial Note (Signed)
Transition of Care (TOC) - Initial/Assessment Note  ? ? ?Patient Details  ?Name: Sheryl Curtis ?MRN: [id number] ?Date of Birth: 08/16/1932 ? ?Transition of Care [facility]) CM/SW Contact:    ?Leeroy Cha, RN ?Phone Number: ?12/27/2021, 8:21 AM ? ?Clinical Narrative:                 ? ?Transition of Care (TOC) Screening Note ? ? ?Patient Details  ?Name: Sheryl Curtis ?Date of Birth: 15-Aug-1932 ? ? ?Transition of Care [facility]) CM/SW Contact:    ?Leeroy Cha, RN ?Phone Number: ?12/27/2021, 8:21 AM ? ? ? ?Transition of Care Department [facility]) has reviewed patient and no TOC needs have been identified at this time. We will continue to monitor patient advancement through interdisciplinary progression rounds. If new patient transition needs arise, please place a TOC consult. ? ? ? ?Expected Discharge Plan: Home/Self Care ?Barriers to Discharge: Continued Medical Work up ? ? ?Patient Goals and CMS Choice ?Patient states their goals for this hospitalization and ongoing recovery are:: unable to state due to bipap ?CMS Medicare.gov Compare Post Acute Care list provided to:: Patient ?Choice offered to / list presented to : Patient ? ?Expected Discharge Plan and Services ?Expected Discharge Plan: Home/Self Care ?  ?Discharge Planning Services: CM Consult ?  ?Living arrangements for the past 2 months: [place] ?                ?  ?  ?  ?  ?  ?  ?  ?  ?  ?  ? ?Prior Living Arrangements/Services ?Living arrangements for the past 2 months: [place] ?Lives with:: Self ?Patient language and need for interpreter reviewed:: Yes ?Do you feel safe going back to the place where you live?: Yes      ?  ?  ?  ?Criminal Activity/Legal Involvement Pertinent to Current Situation/Hospitalization: No - Comment as needed ? ?Activities of Daily Living ?  ?ADL Screening (condition at time of admission) ?Patient's cognitive ability adequate to safely complete daily activities?: Yes ?Is the patient deaf or have difficulty hearing?:  Yes ?Does the patient have difficulty seeing, even when wearing glasses/contacts?: Yes ?Does the patient have difficulty concentrating, remembering, or making decisions?: No ?Patient able to express need for assistance with ADLs?: Yes ?Does the patient have difficulty dressing or bathing?: No ?Independently performs ADLs?: Yes (appropriate for developmental age) ?Does the patient have difficulty walking or climbing stairs?: No ?Weakness of Legs: Both ?Weakness of Arms/Hands: None ? ?Permission Sought/Granted ?  ?  ?   ?   ?   ?   ? ?Emotional Assessment ?Appearance:: Appears stated age ?  ?  ?Orientation: : Oriented to Self, Oriented to Place, Oriented to  Time, Oriented to Situation ?Alcohol / Substance Use: Not Applicable ?Psych Involvement: No (comment) ? ?Admission diagnosis:  Acute respiratory distress [R06.03] ?Persistent atrial fibrillation ([place]) [I48.19] ?Patient Active Problem List  ? Diagnosis Date Noted  ? Acute respiratory distress 01/05/2022  ? Paroxysmal atrial fibrillation with RVR ([place]) 12/29/2021  ? Acute on chronic respiratory failure with hypoxia (HCC)   ? Lumbar facet arthropathy 06/05/2021  ? Lumbar degenerative disc disease 06/05/2021  ? Lumbar facet joint pain 06/05/2021  ? Lumbar radicular pain 06/05/2021  ? Chronic pain syndrome 06/05/2021  ? Chronic respiratory failure with hypoxia ([place]) 01/18/2021  ? Acute on chronic diastolic CHF (congestive heart failure) ([place]) 01/18/2021  ? Medication management 09/18/2020  ? Healthcare maintenance 09/18/2020  ? Mild persistent  asthma without complication 83/38/2505  ? Bilateral sensorineural hearing loss 09/17/2019  ? Dysphagia 06/14/2019  ? Neck pain, chronic 04/21/2019  ? Physical deconditioning 12/09/2018  ? CKD (chronic kidney disease)   ? Morbid obesity ([place])   ? Mild persistent asthma with (acute) exacerbation 07/16/2018  ? Cough 07/16/2018  ? OSA (obstructive sleep apnea) 07/30/2017  ? Trochanteric bursitis, right hip 04/21/2017  ? Nocturnal  hypoxia 03/31/2017  ? Sinus bradycardia 08/05/2016  ? Hypertensive urgency 08/03/2016  ? Asthma 02/19/2016  ? Chronic kidney disease (CKD), stage III (moderate) ([place]) 07/05/2015  ? CAFL (chronic airflow limitation) ([place]) 07/05/2015  ? Generalized OA 07/05/2015  ? Detrusor muscle hypertonia 07/05/2015  ? H/O malignant neoplasm of colon 07/05/2015  ? Elevated troponin 07/02/2015  ? UTI (lower urinary tract infection) 07/01/2015  ? Fibromyalgia syndrome 07/01/2015  ? Hyponatremia 07/01/2015  ? Hypokalemia 07/01/2015  ? Urinary tract infectious disease   ? Pulmonary nodules/lesions, multiple 04/11/2014  ? Abdominal pain, generalized 04/07/2014  ? Coronary atherosclerosis of native coronary artery 10/12/2013  ? Essential hypertension, benign   ? Other and unspecified angina pectoris   ? Atrial fibrillation with rapid ventricular response ([place]) 04/19/2010  ? Allergic rhinitis 04/19/2010  ? DYSPNEA 04/19/2010  ? CHEST PAIN, ATYPICAL 04/19/2010  ? CANCER, COLORECTAL 04/18/2010  ? Essential hypertension 04/18/2010  ? Fibromyalgia 04/18/2010  ? INSOMNIA 04/18/2010  ? ?PCP:  Collene Leyden, MD ?Pharmacy:   ?[facility], [address] ?[place] ?[place] [address] ?Phone: [phone] Fax: [phone] ? ?[facility] [address] ?[address] ?[place] [address] ?Phone: [phone] Fax: [phone] ? ? ? ? ?Social Determinants of Health (SDOH) Interventions ?  ? ?Readmission Risk Interventions ?No flowsheet data found. ? ? ?

## 2021-12-28 ENCOUNTER — Inpatient Hospital Stay (HOSPITAL_COMMUNITY): Payer: Medicare Other

## 2021-12-28 DIAGNOSIS — R0603 Acute respiratory distress: Secondary | ICD-10-CM | POA: Diagnosis not present

## 2021-12-28 DIAGNOSIS — I4891 Unspecified atrial fibrillation: Secondary | ICD-10-CM | POA: Diagnosis not present

## 2021-12-28 DIAGNOSIS — J9601 Acute respiratory failure with hypoxia: Secondary | ICD-10-CM | POA: Diagnosis not present

## 2021-12-28 DIAGNOSIS — I5033 Acute on chronic diastolic (congestive) heart failure: Secondary | ICD-10-CM | POA: Diagnosis not present

## 2021-12-28 DIAGNOSIS — J9621 Acute and chronic respiratory failure with hypoxia: Secondary | ICD-10-CM | POA: Diagnosis not present

## 2021-12-28 LAB — BASIC METABOLIC PANEL
Anion gap: 11 (ref 5–15)
BUN: 49 mg/dL — ABNORMAL HIGH (ref 8–23)
CO2: 27 mmol/L (ref 22–32)
Calcium: 8.3 mg/dL — ABNORMAL LOW (ref 8.9–10.3)
Chloride: 94 mmol/L — ABNORMAL LOW (ref 98–111)
Creatinine, Ser: 1.74 mg/dL — ABNORMAL HIGH (ref 0.44–1.00)
GFR, Estimated: 28 mL/min — ABNORMAL LOW (ref >60)
Glucose, Bld: 180 mg/dL — ABNORMAL HIGH (ref 70–99)
Potassium: 3.9 mmol/L (ref 3.5–5.1)
Sodium: 132 mmol/L — ABNORMAL LOW (ref 135–145)

## 2021-12-28 LAB — CBC
HCT: 35 % — ABNORMAL LOW (ref 36.0–46.0)
Hemoglobin: 11.4 g/dL — ABNORMAL LOW (ref 12.0–15.0)
MCH: 30.2 pg (ref 26.0–34.0)
MCHC: 32.6 g/dL (ref 30.0–36.0)
MCV: 92.6 fL (ref 80.0–100.0)
Platelets: 269 10*3/uL (ref 150–400)
RBC: 3.78 MIL/uL — ABNORMAL LOW (ref 3.87–5.11)
RDW: 15.4 % (ref 11.5–15.5)
WBC: 23 10*3/uL — ABNORMAL HIGH (ref 4.0–10.5)
nRBC: 0 % (ref 0.0–0.2)

## 2021-12-28 LAB — GLUCOSE, CAPILLARY: Glucose-Capillary: 191 mg/dL — ABNORMAL HIGH (ref 70–99)

## 2021-12-28 MED ORDER — GLYCOPYRROLATE 0.2 MG/ML IJ SOLN: 0.2000 mg | INTRAMUSCULAR | Status: DC | PRN

## 2021-12-28 MED ORDER — ACETAMINOPHEN 325 MG PO TABS: 650.0000 mg | ORAL_TABLET | Freq: Four times a day (QID) | ORAL | Status: DC | PRN

## 2021-12-28 MED ORDER — ONDANSETRON HCL 4 MG/2ML IJ SOLN: 4.0000 mg | Freq: Four times a day (QID) | INTRAMUSCULAR | Status: DC | PRN

## 2021-12-28 MED ORDER — ACETAMINOPHEN 650 MG RE SUPP: 650.0000 mg | Freq: Four times a day (QID) | RECTAL | Status: DC | PRN

## 2021-12-28 MED ORDER — SODIUM CHLORIDE 0.9 % IV SOLN
1.0000 mg/h | INTRAVENOUS | Status: DC
  Administered 2021-12-28: 1 mg/h via INTRAVENOUS
  Filled 2021-12-28: qty 5

## 2021-12-28 MED ORDER — GLYCOPYRROLATE 1 MG PO TABS: 1.0000 mg | ORAL_TABLET | ORAL | Status: DC | PRN

## 2021-12-28 MED ORDER — POLYVINYL ALCOHOL 1.4 % OP SOLN
1.0000 [drp] | Freq: Four times a day (QID) | OPHTHALMIC | Status: DC | PRN
  Filled 2021-12-28: qty 15

## 2021-12-28 MED ORDER — HALOPERIDOL LACTATE 2 MG/ML PO CONC
0.5000 mg | ORAL | Status: DC | PRN
  Filled 2021-12-28: qty 0.3

## 2021-12-28 MED ORDER — HYDROMORPHONE BOLUS VIA INFUSION
1.0000 mg | INTRAVENOUS | Status: DC | PRN
  Administered 2021-12-28 (×9): 1 mg via INTRAVENOUS
  Filled 2021-12-28: qty 1

## 2021-12-28 MED ORDER — BIOTENE DRY MOUTH MT LIQD: 15.0000 mL | OROMUCOSAL | Status: DC | PRN

## 2021-12-28 MED ORDER — HALOPERIDOL 1 MG PO TABS: 0.5000 mg | ORAL_TABLET | ORAL | Status: DC | PRN

## 2021-12-28 MED ORDER — HALOPERIDOL LACTATE 5 MG/ML IJ SOLN: 0.5000 mg | INTRAMUSCULAR | Status: DC | PRN

## 2021-12-28 MED ORDER — SODIUM CHLORIDE 0.9 % IV SOLN: INTRAVENOUS | Status: DC

## 2021-12-28 MED ORDER — LORAZEPAM 2 MG/ML IJ SOLN
1.0000 mg | INTRAMUSCULAR | Status: DC | PRN
  Administered 2021-12-28: 1 mg via INTRAVENOUS
  Filled 2021-12-28: qty 1

## 2021-12-28 MED ORDER — ONDANSETRON 4 MG PO TBDP: 4.0000 mg | ORAL_TABLET | Freq: Four times a day (QID) | ORAL | Status: DC | PRN

## 2021-12-31 LAB — CULTURE, BLOOD (ROUTINE X 2)
Culture: NO GROWTH
Culture: NO GROWTH
Special Requests: ADEQUATE

## 2022-01-01 ENCOUNTER — Ambulatory Visit: Payer: Medicare Other | Admitting: *Deleted

## 2022-01-01 LAB — LEGIONELLA PNEUMOPHILA SEROGP 1 UR AG: L. pneumophila Serogp 1 Ur Ag: NEGATIVE

## 2022-01-18 ENCOUNTER — Other Ambulatory Visit: Payer: Self-pay | Admitting: Interventional Cardiology

## 2022-01-26 NOTE — Progress Notes (Signed)
01/25/2022 ? ?Hydromorphone (Dilaudid) infusion 50mg  in 111mL NS (0.5mg /mL) drip: 70 mL remaining in bag at time of patient's death. Wasted in Hospital doctor by Tyrell Antonio RN and Lorretta Harp RN. ?Cindy S. Brigitte Pulse BSN, RN, CCRP ?2022/01/25 9:28 PM ? ?

## 2022-01-26 NOTE — Progress Notes (Signed)
? ?Name: Sheryl Curtis ?MRN: [id number] ?DOB: 11/03/1931   ? ?ADMISSION DATE:  01/20/2022 ?CONSULTATION DATE:  2022-01-08 ? ? ?REFERRING MD :  Karle Starch, EDP ? ?CHIEF COMPLAINT: Respiratory distress ? ? ?HISTORY OF PRESENT ILLNESS: 88 year old remote smoker was followed in the office for 'COPD', OSA and chronic respiratory failure requiring 2 L of oxygen. ?She has declined over the past few months.  She  also has chronic back pain and arrives in a wheelchair for the  last few visits. ?Surprisingly PFTs have not shown significant airway obstruction ?HRCT in the past has not shown any sign of ILD ? ?She was sent to the ER on 2/23 treated for atrial fibrillation with RVR again on 2/23 treated for elevated blood pressure.  Cardiology changed Cardizem to 120 twice daily. ?She was started on Augmentin by PCP for sinus infection in 2/24.  She was seen in the pulmonary clinic by my partner 2/28 for shortness of breath and requiring 4 L which is worse than her baseline 2 L .  Hospitalization was advised as a direct admit.  She was given Solu-Medrol 80 and DuoNebs ? ?She was brought in by EMS AM 3/1 for worsening hypoxia and placed on BiPAP in the emergency room due to increased work of breathing ?She received 125 Solu-Medrol by EMS in route with nebs ?Initial testing showed BNP 237, leukocytosis 16 K, COVID flu was negative. ?Chest x-ray independently reviewed shows bibasilar pneumonia ? ?Blood pressure was, highest 205/176, started on Cardizem drip for atrial fibrillation/RVR.  Given 5 of labetalol and 40 of IV Lasix ? ?PAST MEDICAL HISTORY :  ?severe rheumatoid arthritis, GERD, CKD, atrial fibrillation, obesity, hypertension ? ?PFTs - 12/2014 - no airway obstruction, ratio 71, pos BD response ?PFTs 11/2018, and mild airway obstruction with FEV1 79%, TLC 101%, DLCO normal, no bronchodilator response ?HRCT 12/2020 no ILD, minimal increase in size of multiple pulm nodules ? ? ?Scheduled Meds: ? budesonide (PULMICORT) nebulizer solution   0.5 mg Nebulization BID  ? Chlorhexidine Gluconate Cloth  6 each Topical Daily  ? diltiazem  60 mg Oral Q8H  ? DULoxetine  60 mg Oral Daily  ? enoxaparin (LOVENOX) injection  1 mg/kg Subcutaneous Q24H  ? furosemide  40 mg Intravenous BID  ? ipratropium-albuterol  3 mL Nebulization Q6H  ? isosorbide mononitrate  60 mg Oral Daily  ? levothyroxine  25 mcg Oral Q0600  ? mouth rinse  15 mL Mouth Rinse BID  ? methylPREDNISolone (SOLU-MEDROL) injection  40 mg Intravenous Q12H  ? rosuvastatin  20 mg Oral Daily  ? ?Continuous Infusions: ? amiodarone 30 mg/hr (Jan 08, 2022 0641)  ? azithromycin 500 mg (01/08/2022 0750)  ? cefTRIAXone (ROCEPHIN)  IV Stopped (12/27/21 9518)  ? diltiazem (CARDIZEM) infusion 15 mg/hr (Jan 08, 2022 0828)  ? ?PRN Meds:.ALPRAZolam, guaiFENesin, hydrALAZINE, ipratropium-albuterol, metoprolol tartrate, morphine injection, ondansetron **OR** ondansetron (ZOFRAN) IV, senna-docusate  ? ? ?SUBJECTIVE: still on bipap c/o dry mouth  ? ? ? ?VITAL SIGNS: ?Temp:  [97.1 ?F (36.2 ?C)-98.6 ?F (37 ?C)] 97.1 ?F (36.2 ?C) (03/03 0735) ?Pulse Rate:  [82-139] 107 (03/03 0802) ?Resp:  [18-34] 24 (03/03 0802) ?BP: (93-198)/(54-112) 161/68 (03/03 0802) ?SpO2:  [93 %-99 %] 95 % (03/03 0802) ?FiO2 (%):  [36 %-45 %] 36 % (03/03 0800) ? ?PHYSICAL EXAMINATION: ?Tmax  98.6 ? ?General appearance:    elderly obese wf slid down in bed to C shape  ?At Rest 02 sats  95% on RA  ?No jvd ?Oropharynx clear,  mucosa nl ?Neck supple ?  Lungs with a few scattered exp > insp rhonchi bilaterally ?RRR no s3 or or sign murmur ?Abd obese with very  limited  excursion  ?Extr warm with traceedema or clubbing noted ?Neuro  Sensorium intact,  no apparent motor deficits  ?  ? ? ?Recent Labs  ?Lab 12/31/2021 ?0702 12/27/21 ?0237 Jan 26, 2022 ?3254  ?NA 133* 130* 132*  ?K 3.2* 4.1 3.9  ?CL 96* 95* 94*  ?CO2 28 23 27   ?BUN 21 33* 49*  ?CREATININE 1.23* 1.69* 1.74*  ?GLUCOSE 156* 252* 180*  ? ?Recent Labs  ?Lab 01/14/2022 ?0702 12/27/21 ?0237 January 26, 2022 ?9826  ?HGB 13.4  12.4 11.4*  ?HCT 40.6 38.3 35.0*  ?WBC 18.3* 21.5* 23.0*  ?PLT 251 278 269  ? ?  ?I personally reviewed images and agree with radiology impression as follows:  ?CXR:   portable  2022-01-26  ?Mildly increased bilateral pneumonia or alveolar edema with left ?basilar atelectasis and small bilateral pleural effusions. ?  ?ASSESSMENT / PLAN: ? ?Acute hypoxic respiratory failure in setting of likely diastolic dysfunction plus asp pna ?>>> does not like bipap and if aspirating probably should wean and leave off esp in DNR setting.  ? ? Aspiration episodes which are more evident during this hospitalization could be the cause of bilateral infiltrates ?- PCT 3/2 was < 0.10  ?- Urine 3/2  for  strep neg/ legionella pending   ? - no evidence of copd here  ? ?rec ?-Continue ceftriaxone/azithromycin pending final culture/ urine resuts ?-BiPAP as needed for comfort purposes prn but try to minimize ?- up in bed or chair as much as possible  ?- wean off steroids  ? ?Aspiration into airway ?-ok for compassionate sips and chips in this setting  ? ?Atrial fibrillation/RVR ?Acute on chronic diastolic heart failure ?-Cardizem plus amiodarone drip, being managed by cardiology ?-Negative balance as tol by bp bun/creat  ? ?  ? ?PCCM service to sign off in DNR setting/ f/u can be prn is status changes ? ? ?Christinia Gully, MD ?Pulmonary and Critical Care Medicine ?[facility] ?Cell [phone]  ? ?After 7:00 pm call [place]  [phone]  ? ? ?

## 2022-01-26 NOTE — Discharge Summary (Addendum)
Death Summary  ?Sheryl Curtis QJJ:[id number] DOB: [birth year] DOA: 01/02/22 ? ?PCP: Collene Leyden, MD ? ? ?Admit date: Jan 02, 2022 ?Date of Death: 08-Jan-2022 ? ?Final Diagnoses:  ?Principal Problem: ?  Acute respiratory distress ?Comfort measures only ?DNR ?Active Problems: ?  Atrial fibrillation with rapid ventricular response ([place]) ?  Essential hypertension, benign ?  Coronary atherosclerosis of native coronary artery ?  Hypokalemia ?  Chronic kidney disease (CKD), stage III (moderate) (HCC) ?  Mild persistent asthma with (acute) exacerbation ?  Morbid obesity ([place]) ?  Acute on chronic diastolic CHF (congestive heart failure) ([place]) ?  Paroxysmal atrial fibrillation with RVR ([place]) ?  Acute on chronic respiratory failure with hypoxia (HCC) ?  Acute hypoxemic respiratory failure ([place]) ? ? ? ? ? ?History of present illness:  ?88 y.o. female with medical history significant of asthma/chronic hypoxia on 3 L nasal cannula, CKD stage IIIb, pulmonary nodule, severe rheumatoid arthritis, CAD status post PCI, paroxysmal A-fib on Eliquis, OSA, sleep apnea, HTN, colon cancer brought to the hospital for evaluation of shortness of breath.  In the ED she was noted to have asthma exacerbation along with component of CHF.  Placed on BiPAP.  Also found to be in A-fib with RVR.  Both cardiology and pulmonology were consulted. ? ?Hospital Course:  ? ?Acute hypoxemic respiratory failure ?-Likely from bibasilar pneumonia ?-Patient was started on BiPAP, was difficult to wean off BiPAP ?-Plan was to transition to comfort measures only ?  ?Atrial fibrillation with RVR ?-Patient was seen by cardiology, started on amiodarone infusion ?-Cardizem and amiodarone were discontinued after patient was made comfort care only ?  ?Acute on chronic diastolic CHF ?-Patient started on Lasix 40 mg IV twice daily ?-Echocardiogram from 2021-01-16 showed EF of 65 to 70% with moderate LVH ? ?  ?Acute kidney injury on CKD stage III ?-Creatinine went up to 1.69  today ?-Secondary to diuresis with Lasix ? ?  ?Chest pain ?-Patient episode of chest pain this evening ?-EKG showed A-fib with RVR with septal infarct ?-Chest pain resolved after she received morphine ?-Troponin was 20, follow serial troponin ?-Patient not a candidate for aggressive measures ?  ?Goals of care ?-Palliative care consulted for goals of care, patient is DNR ?-Patient was transitioned to comfort care and started on Dilaudid gtt.  ?-Patient expired on 04-Jan-2022 at 01/16/45 hrs.  ? ? ?Time of death: January 16, 2045 hrs. on 2022-01-04 ? ?Signed: ? ?[name]  ?[facility] ?2022/01/08, 4:14 PM ? ? ?  ?

## 2022-01-26 NOTE — Progress Notes (Signed)
[facility] ? ?PROGRESS NOTE ? ?Sheryl Curtis YSA:[id number] DOB: 04-May-1932 DOA: 01/15/2022 ?PCP: Collene Leyden, MD ? ? ?Brief HPI:   ?88 y.o. female with medical history significant of asthma/chronic hypoxia on 3 L nasal cannula, CKD stage IIIb, pulmonary nodule, severe rheumatoid arthritis, CAD status post PCI, paroxysmal A-fib on Eliquis, OSA, sleep apnea, HTN, colon cancer brought to the hospital for evaluation of shortness of breath.  In the ED she was noted to have asthma exacerbation along with component of CHF.  Placed on BiPAP.  Also found to be in A-fib with RVR.  Both cardiology and pulmonology were consulted. ? ? ? ?Subjective  ? ?Patient seen and examined, continues to be on BiPAP.  Palliative care met with family today and the decision has been made to initiate comfort measures this afternoon. ? ? ? Assessment/Plan:  ? ? ?Acute hypoxemic respiratory failure ?-Likely from bibasilar pneumonia ?-Patient is currently on BiPAP ?-Continue ceftriaxone, Zithromax ?-Continue Solu-Medrol ?-Swallow evaluation ordered per pulmonology ? ?Atrial fibrillation with RVR ?-Patient was seen by cardiology, started on amiodarone infusion ?-Patient has now converted back to normal sinus rhythm, cardiology recommends to DC Cardizem gtt. ? ?Acute on chronic diastolic CHF ?-Patient started on Lasix 40 mg IV twice daily ?-Echocardiogram from 2022 showed EF of 65 to 70% with moderate LVH ?-Cardiology following ? ?Acute kidney injury on CKD stage III ?-Creatinine went up to 1.69 today ?-Secondary to diuresis with Lasix ?-Follow renal function in a.m. ? ?Chest pain ?-Patient episode of chest pain this evening ?-EKG showed A-fib with RVR with septal infarct ?-Chest pain resolved after she received morphine ?-Troponin was 20, follow serial troponin ?-Patient not a candidate for aggressive measures ? ?Goals of care ?-Palliative care consulted for goals of care, patient is DNR ?-plan to transition to comfort care this  afternoon. ? ? ? ?Medications ? ?  ? budesonide (PULMICORT) nebulizer solution  0.5 mg Nebulization BID  ? Chlorhexidine Gluconate Cloth  6 each Topical Daily  ? diltiazem  60 mg Oral Q8H  ? DULoxetine  60 mg Oral Daily  ? enoxaparin (LOVENOX) injection  1 mg/kg Subcutaneous Q24H  ? furosemide  40 mg Intravenous BID  ? ipratropium-albuterol  3 mL Nebulization Q6H  ? isosorbide mononitrate  60 mg Oral Daily  ? levothyroxine  25 mcg Oral Q0600  ? mouth rinse  15 mL Mouth Rinse BID  ? methylPREDNISolone (SOLU-MEDROL) injection  40 mg Intravenous Q12H  ? ? ? Data Reviewed:  ? ?CBG: ? ?Recent Labs  ?Lab 12/27/21 ?0748 01/19/22 ?0734  ?GLUCAP 225* 191*  ? ? ?SpO2: 93 % ?O2 Flow Rate (L/min): 8 L/min ?FiO2 (%): 36 %  ? ? ?Vitals:  ? 01/19/2022 0756 01-19-22 0800 2022/01/19 0802 2022-01-19 1047  ?BP:   (!) 161/68 (!) 168/60  ?Pulse:   (!) 107 72  ?Resp:   (!) 24 17  ?Temp:      ?TempSrc:      ?SpO2: 95% 95% 95% 93%  ? ? ? ? ?Data Reviewed: ? ?Basic Metabolic Panel: ?Recent Labs  ?Lab 12/21/21 ?2023 12/25/21 ?1208 12/31/2021 ?3235 12/27/21 ?5732 19-Jan-2022 ?2025  ?NA 136 134* 133* 130* 132*  ?K 3.6 3.4* 3.2* 4.1 3.9  ?CL 98 96 96* 95* 94*  ?CO2 '28 30 28 23 27  ' ?GLUCOSE 163* 134* 156* 252* 180*  ?BUN 20 26* 21 33* 49*  ?CREATININE 1.21* 1.22* 1.23* 1.69* 1.74*  ?CALCIUM 9.0 9.5 8.9 8.5* 8.3*  ?MG  --   --  2.6* 2.3  --   ? ? ?CBC: ?Recent Labs  ?Lab 12/21/21 ?2023 12/25/21 ?1208 01/17/2022 ?9024 12/27/21 ?0973 01/13/22 ?5329  ?WBC 14.9* 15.6* 18.3* 21.5* 23.0*  ?NEUTROABS 11.6* 13.6* 14.5*  --   --   ?HGB 12.6 13.2 13.4 12.4 11.4*  ?HCT 39.3 39.9 40.6 38.3 35.0*  ?MCV 94.7 89.4 91.6 93.4 92.6  ?PLT 179 247.0 251 278 269  ? ? ?LFT ?Recent Labs  ?Lab 12/25/21 ?1208 01/17/2022 ?0702 12/27/21 ?0237  ?AST '20 25 29  ' ?ALT 16 22 34  ?ALKPHOS 55 55 48  ?BILITOT 0.5 0.5 0.2*  ?PROT 7.1 7.2 6.6  ?ALBUMIN 4.0 3.9 3.5  ? ?  ?Antibiotics: ?Anti-infectives (From admission, onward)  ? ? Start     Dose/Rate Route Frequency Ordered Stop  ? 12/27/21 0900   cefTRIAXone (ROCEPHIN) 1 g in sodium chloride 0.9 % 100 mL IVPB       ? 1 g ?200 mL/hr over 30 Minutes Intravenous Every 24 hours 12/30/2021 1035    ? 01/04/2022 0830  azithromycin (ZITHROMAX) 500 mg in sodium chloride 0.9 % 250 mL IVPB       ? 500 mg ?250 mL/hr over 60 Minutes Intravenous Every 24 hours 12/29/2021 0823    ? 01/08/2022 0815  cefTRIAXone (ROCEPHIN) 1 g in sodium chloride 0.9 % 100 mL IVPB       ? 1 g ?200 mL/hr over 30 Minutes Intravenous  Once 01/05/2022 0814 01/23/2022 0938  ? 01/15/2022 0815  azithromycin (ZITHROMAX) tablet 500 mg  Status:  Discontinued       ? 500 mg Oral  Once 01/16/2022 9242 01/16/2022 6834  ? ?  ? ? ? ?DVT prophylaxis: Lovenox ? ?Code Status: DNR ? ?Family Communication: Discussed with family at bedside ? ? ?CONSULTS cardiology, pulmonology ? ? ?Objective  ? ? ?Physical Examination: ? ? ?General-appears in no acute distress ?Heart-S1-S2, regular, no murmur auscultated ?Lungs-decreased breath sounds bilaterally ?Abdomen-soft, nontender, no organomegaly ?Extremities-no edema in the lower extremities ?Neuro-alert, oriented x3, no focal deficit noted ? ?Status is: Inpatient: Patient admitted with acute hypoxemic respiratory failure ? ? ? ?  ? ?Seen ? ?[name] ?  ?[facility] ?If 7PM-7AM, please contact night-coverage at [email or web address], ?Office  [phone] ? ? ?2022-01-13, 10:57 AM  LOS: 2 days  ? ? ? ? ? ? ? ? ? ? ?  ?

## 2022-01-26 NOTE — Evaluation (Signed)
SLP Cancellation Note ? ?Patient Details ?Name: Sheryl Curtis ?MRN: [id number] ?DOB: 05-08-1932 ? ? ?Cancelled treatment:       Reason Eval/Treat Not Completed: Other (comment) (SLP will sign off as pt comfort care per RN, Thanks.) ?Kathleen Lime, MS CCC SLP ?Acute Rehab Services ?Office [phone] ?Pager [phone] ? ? ?[name] ?Jan 17, 2022, 12:57 PM ? ? ? ?

## 2022-01-26 NOTE — Progress Notes (Signed)
Removed PT from BiPAP for approx 4 mins to take a break from mask, received a scheduled neb treatment, mouth was swabbed by family 3 times (tolerated well). PT is now back on BiPAP (WOB was moderate and now tolerating BiPAP well). ?

## 2022-01-26 NOTE — Progress Notes (Signed)
Attended to family as patient passed.  Three daughters t bedside - with support from [place] each family member was able to say goodbye and express their love to their  Mother.  Sheryl Curtis and prayed and offered comfort to one another.   ? ? ? January 08, 2022 2100  ?Clinical Encounter Type  ?Visited With Patient and family together  ?Visit Type Follow-up;Psychological support;Spiritual support;Death  ?Referral From Nurse  ?Consult/Referral To Chaplain  ?Recommendations Grief  ?Stress Factors  ?Patient Stress Factors None identified  ?Family Stress Factors Exhausted;Loss of control;Loss;Major life changes  ? ?Ended visit with a departing blessing.   ?

## 2022-01-26 NOTE — Progress Notes (Signed)
Assisted family at bedside of Sheryl Curtis - surrounded by her family of five sib,ings and friends - totaling 8 people.  Prayed with Trine who was able to communicate her faith and need for prayer.  Anointed her with oil and prayed with family surrounding her.  Provided spiritual care and comfort to Sheryl Curtis and family.   ? ? ? January 17, 2022 1400  ?Clinical Encounter Type  ?Visit Type Follow-up;Psychological support;Spiritual support;Patient actively dying  ?Referral From Chaplain  ?Consult/Referral To Chaplain  ?Recommendations Address Anticipatory Grief for family at bedside.  ?Stress Factors  ?Patient Stress Factors Major life changes;Exhausted  ?Family Stress Factors Exhausted;Loss;Loss of control;Major life changes  ? ? ?

## 2022-01-26 NOTE — Progress Notes (Signed)
Chaplain provided emotional and spiritual support to family (3 daughters) around their decision to provide comfort care only to their mother.  Chaplain affirmed that their decision was made with love for their mother whom they felt was suffering.  Chaplain encouraged them to spend time with their mother and to help provide her comfort through holding her hand, talking with her or playing her music.  With permission of the unit, they are planning to bring her dog up to be with her.  They are awaiting more family members (a grandchild and a niece) to come up before any life-sustaining support is removed.  One sister felt that it would be too long to wait and didn't want their mom to suffer.  Chaplain affirmed her concern as well as the concern for wanting more family present and let them know that they could keep an eye on how their mother is doing and decide to move things up if they felt that she was uncomfortable.  Chaplains will continue to provide support as we are able, but please also page [place] if needs arise or if family requests.  Pager, [phone] ? ?Chaplain [employer], Bcc ?12:38 PM ? ?

## 2022-01-26 NOTE — Progress Notes (Signed)
? ?                                                                                                                                                     ?                                                   ?Daily Progress Note  ? ?Patient Name: Sheryl Curtis       Date: 01/05/2022 ?DOB: 28-Jun-1932  Age: 88 y.o. MRN#: [id number] ?Attending Physician: [name], MD ?Primary Care Physician: Collene Leyden, MD ?Admit Date: 01/08/2022 ? ?Reason for Consultation/Follow-up: Establishing goals of care ? ?Subjective: ?Patient remains dependent on BiPAP.  Several children are at bedside.  Alerted by nursing colleagues that the patient's children wish to have a family meeting to discuss goals of care. ? ?Length of Stay: 2 ? ?Current Medications: ?Scheduled Meds:  ? budesonide (PULMICORT) nebulizer solution  0.5 mg Nebulization BID  ? Chlorhexidine Gluconate Cloth  6 each Topical Daily  ? diltiazem  60 mg Oral Q8H  ? DULoxetine  60 mg Oral Daily  ? enoxaparin (LOVENOX) injection  1 mg/kg Subcutaneous Q24H  ? furosemide  40 mg Intravenous BID  ? ipratropium-albuterol  3 mL Nebulization Q6H  ? isosorbide mononitrate  60 mg Oral Daily  ? levothyroxine  25 mcg Oral Q0600  ? mouth rinse  15 mL Mouth Rinse BID  ? methylPREDNISolone (SOLU-MEDROL) injection  40 mg Intravenous Q12H  ? ? ?Continuous Infusions: ? sodium chloride    ? amiodarone 30 mg/hr (01/05/22 0641)  ? azithromycin Stopped (01/05/22 0850)  ? cefTRIAXone (ROCEPHIN)  IV Stopped (01-05-2022 0944)  ? diltiazem (CARDIZEM) infusion 15 mg/hr (01/05/2022 0828)  ? HYDROmorphone    ? ? ?PRN Meds: ?acetaminophen **OR** acetaminophen, antiseptic oral rinse, glycopyrrolate **OR** glycopyrrolate **OR** glycopyrrolate, guaiFENesin, haloperidol **OR** haloperidol **OR** haloperidol lactate, hydrALAZINE, HYDROmorphone, ipratropium-albuterol, LORazepam, metoprolol tartrate, morphine injection, ondansetron **OR** ondansetron (ZOFRAN) IV, ondansetron **OR** ondansetron (ZOFRAN) IV, polyvinyl  alcohol, senna-docusate ? ?Physical Exam         ?Patient resting in bed laying on 1 side ?Remains on BiPAP ?Appears to be using accessory muscles of respiration ?Has mild to moderate degree of respiratory distress ?Monitor noted ?Has some edema ?S1-S2 ?Noted to have scattered rhonchi ? ?Vital Signs: BP (!) 161/68   Pulse (!) 107   Temp (!) 97.1 ?F (36.2 ?C) (Axillary) Comment: RN notified  Resp (!) 24   SpO2 95%  ?SpO2: SpO2: 95 % ?O2 Device: O2 Device: Bi-PAP ?O2 Flow Rate: O2 Flow Rate (L/min): 8 L/min ? ?Intake/output summary:  ?Intake/Output Summary (Last 24 hours) at Jan 05, 2022 1005 ?Last data filed at 01/05/22  9373 ?Gross per 24 hour  ?Intake 722.6 ml  ?Output 2365 ml  ?Net -1642.4 ml  ? ?LBM: Last BM Date : 12/25/21 ?Baseline Weight:   ?Most recent weight:   ? ?     ?Palliative Assessment/Data: ? ? ? ? ? ?Patient Active Problem List  ? Diagnosis Date Noted  ? Acute hypoxemic respiratory failure ([place]) 12/27/2021  ? Acute respiratory distress 01/25/2022  ? Paroxysmal atrial fibrillation with RVR ([place]) 01/17/2022  ? Acute on chronic respiratory failure with hypoxia ([facility])   ? Lumbar facet arthropathy 06/05/2021  ? Lumbar degenerative disc disease 06/05/2021  ? Lumbar facet joint pain 06/05/2021  ? Lumbar radicular pain 06/05/2021  ? Chronic pain syndrome 06/05/2021  ? Chronic respiratory failure with hypoxia ([place]) 01/18/2021  ? Acute on chronic diastolic CHF (congestive heart failure) ([place]) 01/18/2021  ? Medication management 09/18/2020  ? Healthcare maintenance 09/18/2020  ? Mild persistent asthma without complication 42/87/6811  ? Bilateral sensorineural hearing loss 09/17/2019  ? Dysphagia 06/14/2019  ? Neck pain, chronic 04/21/2019  ? Physical deconditioning 12/09/2018  ? CKD (chronic kidney disease)   ? Morbid obesity ([place])   ? Mild persistent asthma with (acute) exacerbation 07/16/2018  ? Cough 07/16/2018  ? OSA (obstructive sleep apnea) 07/30/2017  ? Trochanteric bursitis, right hip 04/21/2017  ?  Nocturnal hypoxia 03/31/2017  ? Sinus bradycardia 08/05/2016  ? Hypertensive urgency 08/03/2016  ? Asthma 02/19/2016  ? Chronic kidney disease (CKD), stage III (moderate) ([place]) 07/05/2015  ? CAFL (chronic airflow limitation) ([place]) 07/05/2015  ? Generalized OA 07/05/2015  ? Detrusor muscle hypertonia 07/05/2015  ? H/O malignant neoplasm of colon 07/05/2015  ? Elevated troponin 07/02/2015  ? UTI (lower urinary tract infection) 07/01/2015  ? Fibromyalgia syndrome 07/01/2015  ? Hyponatremia 07/01/2015  ? Hypokalemia 07/01/2015  ? Urinary tract infectious disease   ? Pulmonary nodules/lesions, multiple 04/11/2014  ? Abdominal pain, generalized 04/07/2014  ? Coronary atherosclerosis of native coronary artery 10/12/2013  ? Essential hypertension, benign   ? Other and unspecified angina pectoris   ? Atrial fibrillation with rapid ventricular response ([place]) 04/19/2010  ? Allergic rhinitis 04/19/2010  ? DYSPNEA 04/19/2010  ? CHEST PAIN, ATYPICAL 04/19/2010  ? CANCER, COLORECTAL 04/18/2010  ? Essential hypertension 04/18/2010  ? Fibromyalgia 04/18/2010  ? INSOMNIA 04/18/2010  ? ? ?Palliative Care Assessment & Plan  ? ?Patient Profile: ? ? ?Assessment: ?Acute hypoxic respiratory failure, aspiration pneumonia, diastolic dysfunction.  Repeated aspiration episodes.  Reported history of COPD.  Aspiration into airway.  A-fib with RVR.  Acute on chronic diastolic heart failure. ? ?Recommendations/Plan: ?Initial palliative consultation was completed on 12-27-2021.  At that time I met with one of the patient's daughters who was present at bedside.  Alerted by RN today that all of the patient's children are at bedside and wished to have a family meeting to discuss goals of care. ?Patient seen and examined.  A family meeting was a held.  I met with the patient's son as well as 3 daughters.  Goals wishes and values attempted to be explored.  ?DNR DNI ?COMFORT MEASURES this afternoon, once all the family members have had a chance to come in.   ?Chaplain consult.  ? Dilaudid drip basal and bolus rate.  Discussed with patient and family in detail about judicious use of opioids and benzodiazepines, essentially having to use proportionate palliative sedation for control of symptoms and avoidance of suffering. ?Appreciate bedside RN support.  ? ?Code Status: ? ?  ?Code Status Orders  ?(From  admission, onward)  ?  ? ? ?  ? ?  Start     Ordered  ? 2022/01/12 1003  Do not attempt resuscitation (DNR)  Continuous       ?Question Answer Comment  ?In the event of cardiac or respiratory ARREST Do not call a ?code blue?   ?In the event of cardiac or respiratory ARREST Do not perform Intubation, CPR, defibrillation or ACLS   ?In the event of cardiac or respiratory ARREST Use medication by any route, position, wound care, and other measures to relive pain and suffering. May use oxygen, suction and manual treatment of airway obstruction as needed for comfort.   ?  ? Jan 12, 2022 1003  ? ?  ?  ? ?  ? ?Code Status History   ? ? Date Active Date Inactive Code Status Order ID Comments User Context  ? 01/25/2022 1034 Jan 12, 2022 1003 DNR [id number]  Damita Lack, MD ED  ? 08/18/2021 1252 08/21/2021 2111 Full Code [id number]  Lequita Halt, MD ED  ? 08/03/2016 0210 08/05/2016 1533 Full Code [id number]  Etta Quill, DO ED  ? 07/02/2015 0009 07/06/2015 1522 Full Code [id number]  Theressa Millard, MD Inpatient  ? ?  ? ? ?Prognosis: ? Hours - Days ? ?Discharge Planning: ?Anticipated Hospital Death ? ?Care plan was discussed with patient, bedside RN's, 3 daughters and 1 son. ? ?Thank you for allowing the Palliative Medicine Team to assist in the care of this patient. ? ? ?Time In: 9.30 Time Out: 10.05 Total Time 35 Prolonged Time Billed  no   ? ?   ?Greater than 50%  of this time was spent counseling and coordinating care related to the above assessment and plan. ? ?Loistine Chance, MD ? ?Please contact Palliative Medicine Team phone at [phone] for questions and concerns.  ? ? ? ? ? ?

## 2022-01-26 NOTE — Progress Notes (Signed)
Jan 08, 2022 ?Patient's time of death occurred at 21-Jan-2045 hours. Family present at bedside with hospital chaplain. Absence of breath and heart sounds confirmed by Tyrell Antonio RN and Lorretta Harp RN at 01/21/2054 hours. ?Gwynn Burly. Brigitte Pulse BSN, RN, CCRP ?2022/01/08 10:30 PM ? ?

## 2022-01-26 DEATH — deceased

## 2022-02-13 ENCOUNTER — Ambulatory Visit: Payer: Medicare Other | Admitting: Physician Assistant

## 2022-08-13 IMAGING — DX DG CHEST 1V PORT
1 series · 1 of 1 positions shown · non-contrast
Comparison: 12/26/2021

CLINICAL DATA: Respiratory failure.

EXAM:
PORTABLE CHEST 1 VIEW

[chest ap]
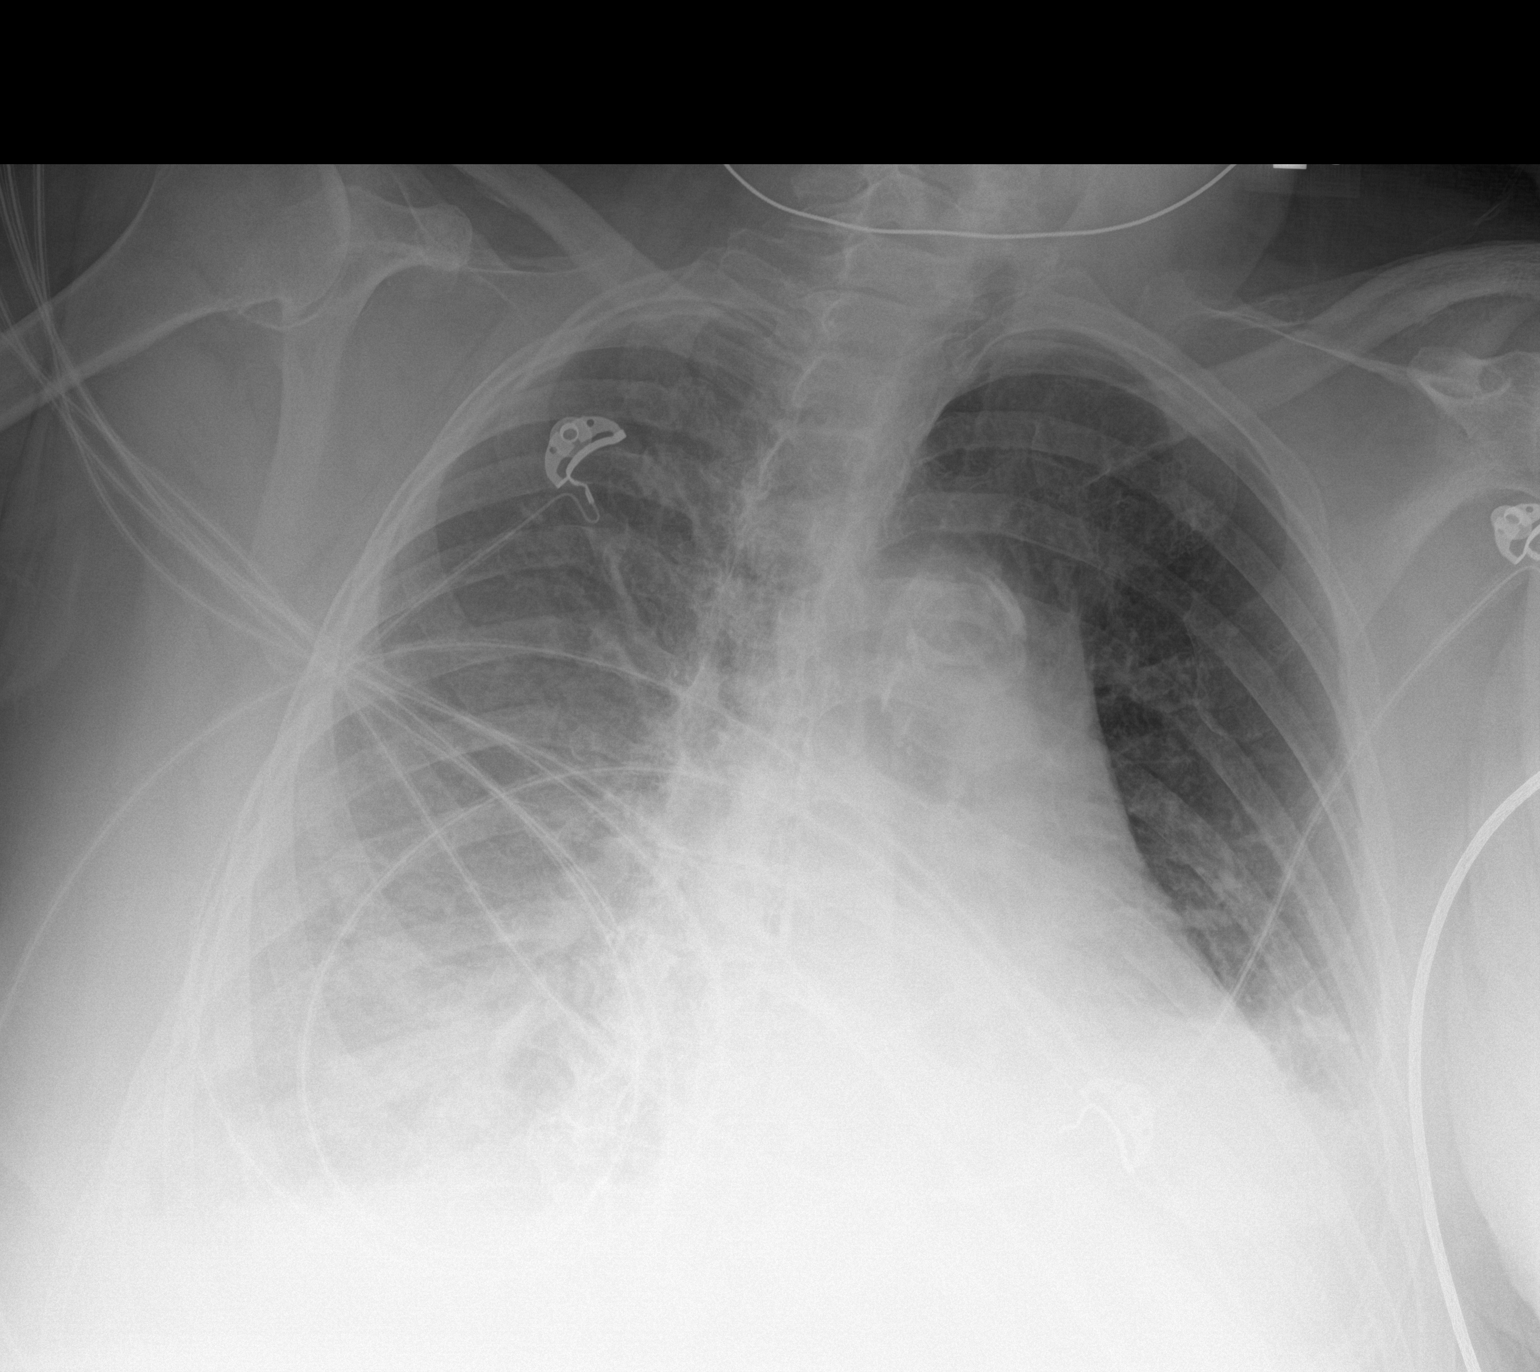

[1 of 1 positions shown; findings below may reference images not displayed]

FINDINGS: Patient rotation to the left. Grossly stable borderline
cardiomegaly. Dense aortic arch calcifications. Mildly increased
bilateral airspace opacity and pleural fluid.
IMPRESSION: Mildly increased bilateral pneumonia or alveolar edema with left
basilar atelectasis and small bilateral pleural effusions.
